# Patient Record
Sex: Male | Born: 1955 | Race: White | Hispanic: No | Marital: Married | Smoking: Former smoker
Health system: Southern US, Community
[De-identification: ages and names within clinical notes are randomized; demographics above are authoritative.]

## PROBLEM LIST (undated history)

## (undated) DIAGNOSIS — E119 Type 2 diabetes mellitus without complications: Secondary | ICD-10-CM

## (undated) DIAGNOSIS — N390 Urinary tract infection, site not specified: Secondary | ICD-10-CM

## (undated) DIAGNOSIS — Z992 Dependence on renal dialysis: Secondary | ICD-10-CM

## (undated) DIAGNOSIS — N189 Chronic kidney disease, unspecified: Secondary | ICD-10-CM

## (undated) DIAGNOSIS — M1990 Unspecified osteoarthritis, unspecified site: Secondary | ICD-10-CM

## (undated) DIAGNOSIS — Z9289 Personal history of other medical treatment: Secondary | ICD-10-CM

## (undated) DIAGNOSIS — I499 Cardiac arrhythmia, unspecified: Secondary | ICD-10-CM

## (undated) DIAGNOSIS — R011 Cardiac murmur, unspecified: Secondary | ICD-10-CM

## (undated) DIAGNOSIS — K219 Gastro-esophageal reflux disease without esophagitis: Secondary | ICD-10-CM

## (undated) DIAGNOSIS — I10 Essential (primary) hypertension: Secondary | ICD-10-CM

## (undated) HISTORY — PX: WISDOM TOOTH EXTRACTION: SHX21

## (undated) HISTORY — PX: EYE SURGERY: SHX253

## (undated) HISTORY — DX: Dependence on renal dialysis: Z99.2

## (undated) HISTORY — PX: OTHER SURGICAL HISTORY: SHX169

---

## 1998-11-05 ENCOUNTER — Emergency Department (HOSPITAL_COMMUNITY): Admission: EM | Admit: 1998-11-05 | Discharge: 1998-11-06 | Payer: Self-pay | Admitting: Emergency Medicine

## 1999-12-31 ENCOUNTER — Emergency Department (HOSPITAL_COMMUNITY): Admission: EM | Admit: 1999-12-31 | Discharge: 1999-12-31 | Payer: Self-pay | Admitting: Emergency Medicine

## 1999-12-31 ENCOUNTER — Encounter: Payer: Self-pay | Admitting: Emergency Medicine

## 2000-04-01 ENCOUNTER — Encounter: Admission: RE | Admit: 2000-04-01 | Discharge: 2000-04-20 | Payer: Self-pay | Admitting: Neurosurgery

## 2002-02-01 ENCOUNTER — Emergency Department (HOSPITAL_COMMUNITY): Admission: EM | Admit: 2002-02-01 | Discharge: 2002-02-01 | Payer: Self-pay | Admitting: Emergency Medicine

## 2006-09-04 ENCOUNTER — Emergency Department (HOSPITAL_COMMUNITY): Admission: EM | Admit: 2006-09-04 | Discharge: 2006-09-04 | Payer: Self-pay | Admitting: Family Medicine

## 2009-05-07 ENCOUNTER — Emergency Department (HOSPITAL_COMMUNITY): Admission: EM | Admit: 2009-05-07 | Discharge: 2009-05-07 | Payer: Self-pay | Admitting: Emergency Medicine

## 2015-08-19 ENCOUNTER — Emergency Department (HOSPITAL_COMMUNITY)
Admission: EM | Admit: 2015-08-19 | Discharge: 2015-08-20 | Disposition: A | Discharge status: Home / Self Care | Payer: BLUE CROSS/BLUE SHIELD | Attending: Emergency Medicine | Admitting: Emergency Medicine

## 2015-08-19 ENCOUNTER — Encounter (HOSPITAL_COMMUNITY): Payer: Self-pay

## 2015-08-19 DIAGNOSIS — T25221A Burn of second degree of right foot, initial encounter: Secondary | ICD-10-CM

## 2015-08-19 DIAGNOSIS — E119 Type 2 diabetes mellitus without complications: Secondary | ICD-10-CM | POA: Insufficient documentation

## 2015-08-19 DIAGNOSIS — F17200 Nicotine dependence, unspecified, uncomplicated: Secondary | ICD-10-CM | POA: Diagnosis not present

## 2015-08-19 DIAGNOSIS — T25021A Burn of unspecified degree of right foot, initial encounter: Secondary | ICD-10-CM | POA: Diagnosis present

## 2015-08-19 DIAGNOSIS — Z23 Encounter for immunization: Secondary | ICD-10-CM | POA: Diagnosis not present

## 2015-08-19 DIAGNOSIS — I10 Essential (primary) hypertension: Secondary | ICD-10-CM | POA: Diagnosis not present

## 2015-08-19 DIAGNOSIS — Y93G3 Activity, cooking and baking: Secondary | ICD-10-CM | POA: Diagnosis not present

## 2015-08-19 DIAGNOSIS — Y998 Other external cause status: Secondary | ICD-10-CM | POA: Insufficient documentation

## 2015-08-19 DIAGNOSIS — X19XXXA Contact with other heat and hot substances, initial encounter: Secondary | ICD-10-CM | POA: Diagnosis not present

## 2015-08-19 DIAGNOSIS — Y9289 Other specified places as the place of occurrence of the external cause: Secondary | ICD-10-CM | POA: Diagnosis not present

## 2015-08-19 HISTORY — DX: Essential (primary) hypertension: I10

## 2015-08-19 HISTORY — DX: Type 2 diabetes mellitus without complications: E11.9
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
## 2015-08-20 LAB — CBC WITH DIFFERENTIAL/PLATELET
Basophils Absolute: 0 10*3/uL (ref 0.0–0.1)
Basophils Relative: 1 %
Eosinophils Absolute: 0.3 10*3/uL (ref 0.0–0.7)
Eosinophils Relative: 3 %
HCT: 44.2 % (ref 39.0–52.0)
Hemoglobin: 15.3 g/dL (ref 13.0–17.0)
Lymphocytes Relative: 26 %
Lymphs Abs: 2.3 10*3/uL (ref 0.7–4.0)
MCH: 34.2 pg — ABNORMAL HIGH (ref 26.0–34.0)
MCHC: 34.6 g/dL (ref 30.0–36.0)
MCV: 98.7 fL (ref 78.0–100.0)
Monocytes Absolute: 0.6 10*3/uL (ref 0.1–1.0)
Monocytes Relative: 7 %
Neutro Abs: 5.5 10*3/uL (ref 1.7–7.7)
Neutrophils Relative %: 63 %
Platelets: 185 10*3/uL (ref 150–400)
RBC: 4.48 MIL/uL (ref 4.22–5.81)
RDW: 12.1 % (ref 11.5–15.5)
WBC: 8.7 10*3/uL (ref 4.0–10.5)

## 2015-08-20 LAB — BASIC METABOLIC PANEL
Anion gap: 9 (ref 5–15)
BUN: 10 mg/dL (ref 6–20)
CO2: 28 mmol/L (ref 22–32)
Calcium: 9.4 mg/dL (ref 8.9–10.3)
Chloride: 99 mmol/L — ABNORMAL LOW (ref 101–111)
Creatinine, Ser: 1.01 mg/dL (ref 0.61–1.24)
GFR calc Af Amer: >60 mL/min (ref >60)
GFR calc non Af Amer: >60 mL/min (ref >60)
Glucose, Bld: 195 mg/dL — ABNORMAL HIGH (ref 65–99)
Potassium: 4.5 mmol/L (ref 3.5–5.1)
Sodium: 136 mmol/L (ref 135–145)

## 2015-08-20 LAB — I-STAT CG4 LACTIC ACID, ED
LACTIC ACID, VENOUS: 2.21 mmol/L — AB (ref 0.5–2.0)
Lactic Acid, Venous: 2.61 mmol/L (ref 0.5–2.0)

## 2015-08-20 MED ORDER — MORPHINE SULFATE (PF) 4 MG/ML IV SOLN
4.0000 mg | Freq: Once | INTRAVENOUS | Status: AC
  Administered 2015-08-20: 4 mg via INTRAVENOUS
  Filled 2015-08-20: qty 1

## 2015-08-20 MED ORDER — SILVER SULFADIAZINE 1 % EX CREA
TOPICAL_CREAM | Freq: Once | CUTANEOUS | Status: AC
  Administered 2015-08-20: 04:00:00 via TOPICAL
  Filled 2015-08-20: qty 85

## 2015-08-20 MED ORDER — METFORMIN HCL 500 MG PO TABS: 500.0000 mg | ORAL_TABLET | Freq: Two times a day (BID) | ORAL | Status: DC

## 2015-08-20 MED ORDER — SODIUM CHLORIDE 0.9 % IV BOLUS (SEPSIS)
1000.0000 mL | Freq: Once | INTRAVENOUS | Status: AC
  Administered 2015-08-20: 1000 mL via INTRAVENOUS

## 2015-08-20 MED ORDER — AMLODIPINE BESYLATE 10 MG PO TABS: 10.0000 mg | ORAL_TABLET | Freq: Every day | ORAL | Status: DC

## 2015-08-20 MED ORDER — ONDANSETRON HCL 4 MG/2ML IJ SOLN
4.0000 mg | Freq: Once | INTRAMUSCULAR | Status: AC
  Administered 2015-08-20: 4 mg via INTRAVENOUS
  Filled 2015-08-20: qty 2

## 2015-08-20 MED ORDER — OXYCODONE-ACETAMINOPHEN 5-325 MG PO TABS
2.0000 | ORAL_TABLET | Freq: Once | ORAL | Status: AC
  Administered 2015-08-20: 2 via ORAL
  Filled 2015-08-20: qty 2

## 2015-08-20 MED ORDER — OXYCODONE-ACETAMINOPHEN 5-325 MG PO TABS: 1.0000 | ORAL_TABLET | ORAL | Status: DC | PRN

## 2015-08-20 MED ORDER — SILVER SULFADIAZINE 1 % EX CREA: 1.0000 "application " | TOPICAL_CREAM | Freq: Every day | CUTANEOUS | Status: DC

## 2015-08-20 MED ORDER — PIPERACILLIN-TAZOBACTAM 3.375 G IVPB 30 MIN
3.3750 g | Freq: Once | INTRAVENOUS | Status: AC
  Administered 2015-08-20: 3.375 g via INTRAVENOUS
  Filled 2015-08-20: qty 50

## 2015-08-20 MED ORDER — TETANUS-DIPHTH-ACELL PERTUSSIS 5-2.5-18.5 LF-MCG/0.5 IM SUSP
0.5000 mL | Freq: Once | INTRAMUSCULAR | Status: AC
  Administered 2015-08-20: 0.5 mL via INTRAMUSCULAR
  Filled 2015-08-20: qty 0.5

## 2015-08-20 NOTE — ED Notes (Signed)
MD aware of Lactic Acid.
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
## 2015-08-20 NOTE — Discharge Instructions (Signed)
Burn Care Your skin is a natural barrier to infection. It is the largest organ of your body. Burns damage this natural protection. To help prevent infection, it is very important to follow your caregiver's instructions in the care of your burn. Burns are classified as:  First degree. There is only redness of the skin (erythema). No scarring is expected.  Second degree. There is blistering of the skin. Scarring may occur with deeper burns.  Third degree. All layers of the skin are injured, and scarring is expected. HOME CARE INSTRUCTIONS   Wash your hands well before changing your bandage.  Change your bandage as often as directed by your caregiver.  Remove the old bandage. If the bandage sticks, you may soak it off with cool, clean water.  Cleanse the burn thoroughly but gently with mild soap and water.  Pat the area dry with a clean, dry cloth.  Apply a thin layer of antibacterial cream to the burn.  Apply a clean bandage as instructed by your caregiver.  Keep the bandage as clean and dry as possible.  Elevate the affected area for the first 24 hours, then as instructed by your caregiver.  Only take over-the-counter or prescription medicines for pain, discomfort, or fever as directed by your caregiver. SEEK IMMEDIATE MEDICAL CARE IF:   You develop excessive pain.  You develop redness, tenderness, swelling, or red streaks near the burn.  The burned area develops yellowish-white fluid (pus) or a bad smell.  You have a fever. MAKE SURE YOU:   Understand these instructions.  Will watch your condition.  Will get help right away if you are not doing well or get worse.   This information is not intended to replace advice given to you by your health care provider. Make sure you discuss any questions you have with your health care provider.   Document Released: 03/24/2005 Document Revised: 06/16/2011 Document Reviewed: 08/14/2010 Elsevier Interactive Patient Education 2016  Elsevier [employer]  Second-Degree Burn A second-degree burn affects the 2 outer layers of skin. The outer layer (epidermis) and the layer underneath it (dermis) are both burned. Another name for this type of burn is a partial thickness burn. A second-degree burn may be called minor or major. This depends on the size of the burn. It also depends on what parts of the skin are burned. Minor burns may be treated with first aid. Major burns are a medical emergency. A second-degree burn is worse than a first-degree burn, but not as bad as a third-degree burn. A first-degree burn affects only the epidermis. A third-degree burn goes through all the layers of skin. A second-degree burn usually heals in 3 to 4 weeks. A minor second-degree burn usually does not leave a scar.Deeper second-degree burns may lead to scarring of the skin or contractures over joints.Contractures are scars that form over joints and may lead to reduced mobility at those joints. CAUSES  Heat (thermal) injury. This happens when skin comes in contact with something very hot. It could be a flame, a hot object, hot liquid, or steam. Most second-degree burns are thermal injuries.  Radiation. Sunlight is one type of radiation that can burn the skin. Another type of radiation is used to heat food. Radiation is also used to treat some diseases, such as cancer. All types of radiation can burn the skin. Sunlight usually causes a first-degree burn. Radiation used for heating food or treating a disease can cause a second-degree burn.  Electricity. Electrical burns can cause more  damage under the skin than on the surface. They should always be treated as major burns.  Chemicals. Many chemicals can burn the skin. The burn should be flushed with cool water and checked by an emergency caregiver. SYMPTOMS Symptoms of second-degree burns include:  Severe pain.  Extreme tenderness.  Deep redness.  Blistered skin.  Skin that has changed color.It  might look blotchy, wet, or shiny.  Swelling. TREATMENT Some second-degree burns may need to be treated in a hospital. These include major burns, electrical burns, and chemical burns. Many other second-degree burns can be treated with regular first aid, such as:  Cooling the burn. Use cool, germ-free (sterile) salt water. Place the burned area of skin into a tub of water, or cover the burned area with clean, wet towels.  Taking pain medicine.  Removing the dead skin from broken blisters. A trained caregiver may do this. Do not pop blisters.  Gently washing your skin with mild soap.  Covering the burned area with a cream.Silver sulfadiazine is a cream for burns. An antibiotic cream, such as bacitracin, may also be used to fight infection. Do not use other ointments or creams unless your caregiver says it is okay.  Protecting the burn with a sterile, non-sticky bandage.  Bandaging fingers and toes separately. This keeps them from sticking together.  Taking an antibiotic. This can help prevent infection.  Getting a tetanus shot. HOME CARE INSTRUCTIONS Medication  Take any medicine prescribed by your caregiver. Follow the directions carefully.  Ask your caregiver if you can take over-the-counter medicine to relieve pain and swelling. Do not give aspirin to children.  Make sure your caregiver knows about all other medicines you take.This includes over-the-counter medicines. Burn care  You will need to change the bandage on your burn. You may need to do this 2 or 3 times each day.  Gently clean the burned area.  Put ointment on it.  Cover the burn with a sterile bandage.  For some deeper burns or burns that cover a large area, compression garments may be prescribed. These garments can help minimize scarring and protect your mobility.  Do not put butter or oil on your skin. Use only the cream prescribed by your caregiver.  Do not put ice on your burn.  Do not break blisters  on your skin.  Keep the bandaged area dry. You might need to take a sponge bath for awhile.Ask your caregiver when you can take a shower or a tub bath again.  Do not scratch an itchy burn. Your caregiver may give you medicine to relieve very bad itching.  Infection is a big danger after a second-degree burn. Tell your caregiver right away if you have signs of infection, such as:  Redness or changing color in the burned area.  Fluid leaking from the burn.  Swelling in the burn area.  A bad smell coming from the wound. Follow-up  Keep all follow-up appointments.This is important. This is how your caregiver can tell if your treatment is working.  Protect your burn from sunlight.Use sunscreen whenever you go outside.Burned areas may be sensitive to the sun for up to 1 year. Exposure to the sun may also cause permanent darkening of scars. SEEK MEDICAL CARE IF:  You have any questions about medicines.  You have any questions about your treatment.  You wonder if it is okay to do a particular activity.  You develop a fever of more than 100.5 F (38.1 C). SEEK IMMEDIATE MEDICAL CARE IF:  You think your burn might be infected. It may change color, become red, leak fluid, swell, or smell bad.  You develop a fever of more than 102 F (38.9 C).   This information is not intended to replace advice given to you by your health care provider. Make sure you discuss any questions you have with your health care provider.   Document Released: 08/26/2010 Document Revised: 06/16/2011 Document Reviewed: 08/26/2010 Elsevier Interactive Patient Education [employer].

## 2015-08-20 NOTE — ED Notes (Signed)
Pt verbalized understanding of discharge instructions and follow up care

## 2015-08-22 ENCOUNTER — Inpatient Hospital Stay (HOSPITAL_COMMUNITY)
Admission: EM | Admit: 2015-08-22 | Discharge: 2015-08-26 | DRG: 603 | Disposition: A | Discharge status: Home / Self Care | Payer: BLUE CROSS/BLUE SHIELD | Attending: Internal Medicine | Admitting: Internal Medicine

## 2015-08-22 ENCOUNTER — Encounter (HOSPITAL_COMMUNITY): Payer: Self-pay | Admitting: Emergency Medicine

## 2015-08-22 DIAGNOSIS — L03115 Cellulitis of right lower limb: Secondary | ICD-10-CM | POA: Diagnosis present

## 2015-08-22 DIAGNOSIS — Z8249 Family history of ischemic heart disease and other diseases of the circulatory system: Secondary | ICD-10-CM | POA: Diagnosis not present

## 2015-08-22 DIAGNOSIS — L97909 Non-pressure chronic ulcer of unspecified part of unspecified lower leg with unspecified severity: Secondary | ICD-10-CM | POA: Diagnosis not present

## 2015-08-22 DIAGNOSIS — Z801 Family history of malignant neoplasm of trachea, bronchus and lung: Secondary | ICD-10-CM | POA: Diagnosis not present

## 2015-08-22 DIAGNOSIS — E11621 Type 2 diabetes mellitus with foot ulcer: Secondary | ICD-10-CM | POA: Diagnosis present

## 2015-08-22 DIAGNOSIS — E1169 Type 2 diabetes mellitus with other specified complication: Secondary | ICD-10-CM | POA: Diagnosis present

## 2015-08-22 DIAGNOSIS — E11622 Type 2 diabetes mellitus with other skin ulcer: Secondary | ICD-10-CM

## 2015-08-22 DIAGNOSIS — L97519 Non-pressure chronic ulcer of other part of right foot with unspecified severity: Secondary | ICD-10-CM | POA: Diagnosis present

## 2015-08-22 DIAGNOSIS — Z833 Family history of diabetes mellitus: Secondary | ICD-10-CM

## 2015-08-22 DIAGNOSIS — E119 Type 2 diabetes mellitus without complications: Secondary | ICD-10-CM | POA: Diagnosis present

## 2015-08-22 DIAGNOSIS — Z7984 Long term (current) use of oral hypoglycemic drugs: Secondary | ICD-10-CM

## 2015-08-22 DIAGNOSIS — T25021A Burn of unspecified degree of right foot, initial encounter: Secondary | ICD-10-CM | POA: Diagnosis present

## 2015-08-22 DIAGNOSIS — S91301A Unspecified open wound, right foot, initial encounter: Secondary | ICD-10-CM

## 2015-08-22 DIAGNOSIS — E1165 Type 2 diabetes mellitus with hyperglycemia: Secondary | ICD-10-CM | POA: Diagnosis present

## 2015-08-22 DIAGNOSIS — Z720 Tobacco use: Secondary | ICD-10-CM

## 2015-08-22 DIAGNOSIS — L0390 Cellulitis, unspecified: Secondary | ICD-10-CM | POA: Diagnosis present

## 2015-08-22 DIAGNOSIS — T798XXA Other early complications of trauma, initial encounter: Secondary | ICD-10-CM | POA: Diagnosis present

## 2015-08-22 DIAGNOSIS — I10 Essential (primary) hypertension: Secondary | ICD-10-CM | POA: Diagnosis present

## 2015-08-22 DIAGNOSIS — Y92009 Unspecified place in unspecified non-institutional (private) residence as the place of occurrence of the external cause: Secondary | ICD-10-CM | POA: Diagnosis not present

## 2015-08-22 DIAGNOSIS — F17200 Nicotine dependence, unspecified, uncomplicated: Secondary | ICD-10-CM | POA: Diagnosis present

## 2015-08-22 DIAGNOSIS — S91309A Unspecified open wound, unspecified foot, initial encounter: Secondary | ICD-10-CM | POA: Diagnosis present

## 2015-08-22 DIAGNOSIS — Z79899 Other long term (current) drug therapy: Secondary | ICD-10-CM | POA: Diagnosis not present

## 2015-08-22 DIAGNOSIS — X101XXA Contact with hot food, initial encounter: Secondary | ICD-10-CM | POA: Diagnosis present

## 2015-08-22 DIAGNOSIS — Z9119 Patient's noncompliance with other medical treatment and regimen: Secondary | ICD-10-CM | POA: Diagnosis not present

## 2015-08-22 DIAGNOSIS — IMO0002 Reserved for concepts with insufficient information to code with codable children: Secondary | ICD-10-CM

## 2015-08-22 DIAGNOSIS — M869 Osteomyelitis, unspecified: Secondary | ICD-10-CM

## 2015-08-22 LAB — CBC WITH DIFFERENTIAL/PLATELET
Basophils Absolute: 0 10*3/uL (ref 0.0–0.1)
Basophils Relative: 0 %
EOS ABS: 0.1 10*3/uL (ref 0.0–0.7)
Eosinophils Relative: 1 %
HCT: 44.2 % (ref 39.0–52.0)
HEMOGLOBIN: 15.4 g/dL (ref 13.0–17.0)
LYMPHS ABS: 1.6 10*3/uL (ref 0.7–4.0)
LYMPHS PCT: 18 %
MCH: 34.3 pg — AB (ref 26.0–34.0)
MCHC: 34.8 g/dL (ref 30.0–36.0)
MCV: 98.4 fL (ref 78.0–100.0)
MONOS PCT: 7 %
Monocytes Absolute: 0.7 10*3/uL (ref 0.1–1.0)
NEUTROS ABS: 6.7 10*3/uL (ref 1.7–7.7)
NEUTROS PCT: 74 %
Platelets: 208 10*3/uL (ref 150–400)
RBC: 4.49 MIL/uL (ref 4.22–5.81)
RDW: 12.1 % (ref 11.5–15.5)
WBC: 9 10*3/uL (ref 4.0–10.5)

## 2015-08-22 LAB — BASIC METABOLIC PANEL
ANION GAP: 11 (ref 5–15)
BUN: 10 mg/dL (ref 6–20)
CHLORIDE: 98 mmol/L — AB (ref 101–111)
CO2: 27 mmol/L (ref 22–32)
CREATININE: 0.86 mg/dL (ref 0.61–1.24)
Calcium: 9.2 mg/dL (ref 8.9–10.3)
GFR calc non Af Amer: >60 mL/min (ref >60)
Glucose, Bld: 156 mg/dL — ABNORMAL HIGH (ref 65–99)
POTASSIUM: 4.8 mmol/L (ref 3.5–5.1)
SODIUM: 136 mmol/L (ref 135–145)

## 2015-08-22 LAB — LACTIC ACID, PLASMA: LACTIC ACID, VENOUS: 2 mmol/L (ref 0.5–2.0)

## 2015-08-22 MED ORDER — ACETAMINOPHEN 325 MG PO TABS: 650.0000 mg | ORAL_TABLET | Freq: Four times a day (QID) | ORAL | Status: DC | PRN

## 2015-08-22 MED ORDER — ACETAMINOPHEN 650 MG RE SUPP
650.0000 mg | Freq: Four times a day (QID) | RECTAL | Status: DC | PRN
Start: 2015-08-22 — End: 2015-08-26

## 2015-08-22 MED ORDER — SILVER SULFADIAZINE 1 % EX CREA
1.0000 "application " | TOPICAL_CREAM | Freq: Every day | CUTANEOUS | Status: DC
  Filled 2015-08-22: qty 85

## 2015-08-22 MED ORDER — INSULIN ASPART 100 UNIT/ML ~~LOC~~ SOLN
0.0000 [IU] | Freq: Every day | SUBCUTANEOUS | Status: DC
  Administered 2015-08-23 – 2015-08-24 (×2): 2 [IU] via SUBCUTANEOUS

## 2015-08-22 MED ORDER — ONDANSETRON HCL 4 MG PO TABS
4.0000 mg | ORAL_TABLET | Freq: Four times a day (QID) | ORAL | Status: DC | PRN
Start: 2015-08-22 — End: 2015-08-26
  Administered 2015-08-24: 4 mg via ORAL
  Filled 2015-08-22: qty 1

## 2015-08-22 MED ORDER — INSULIN ASPART 100 UNIT/ML ~~LOC~~ SOLN
0.0000 [IU] | Freq: Three times a day (TID) | SUBCUTANEOUS | Status: DC
  Administered 2015-08-23: 5 [IU] via SUBCUTANEOUS
  Administered 2015-08-23: 2 [IU] via SUBCUTANEOUS
  Administered 2015-08-23 – 2015-08-25 (×5): 3 [IU] via SUBCUTANEOUS
  Administered 2015-08-25: 2 [IU] via SUBCUTANEOUS
  Administered 2015-08-25: 3 [IU] via SUBCUTANEOUS
  Administered 2015-08-26: 5 [IU] via SUBCUTANEOUS

## 2015-08-22 MED ORDER — SODIUM CHLORIDE 0.9 % IV SOLN
1250.0000 mg | Freq: Two times a day (BID) | INTRAVENOUS | Status: DC
  Administered 2015-08-22 – 2015-08-25 (×6): 1250 mg via INTRAVENOUS
  Filled 2015-08-22 (×8): qty 1250

## 2015-08-22 MED ORDER — GUAIFENESIN ER 600 MG PO TB12
600.0000 mg | ORAL_TABLET | Freq: Two times a day (BID) | ORAL | Status: DC
  Administered 2015-08-23 – 2015-08-26 (×8): 600 mg via ORAL
  Filled 2015-08-22 (×8): qty 1

## 2015-08-22 MED ORDER — AMLODIPINE BESYLATE 10 MG PO TABS
10.0000 mg | ORAL_TABLET | Freq: Every day | ORAL | Status: DC
  Administered 2015-08-23 – 2015-08-26 (×4): 10 mg via ORAL
  Filled 2015-08-22 (×4): qty 1

## 2015-08-22 MED ORDER — SODIUM CHLORIDE 0.9 % IV SOLN
INTRAVENOUS | Status: DC
  Administered 2015-08-23: via INTRAVENOUS

## 2015-08-22 MED ORDER — ALBUTEROL SULFATE (2.5 MG/3ML) 0.083% IN NEBU: 2.5000 mg | INHALATION_SOLUTION | RESPIRATORY_TRACT | Status: DC | PRN

## 2015-08-22 MED ORDER — OXYCODONE-ACETAMINOPHEN 5-325 MG PO TABS
1.0000 | ORAL_TABLET | ORAL | Status: DC | PRN
  Administered 2015-08-23: 1 via ORAL
  Filled 2015-08-22: qty 1

## 2015-08-22 MED ORDER — ONDANSETRON HCL 4 MG/2ML IJ SOLN: 4.0000 mg | Freq: Four times a day (QID) | INTRAMUSCULAR | Status: DC | PRN

## 2015-08-22 MED ORDER — ENOXAPARIN SODIUM 40 MG/0.4ML ~~LOC~~ SOLN
40.0000 mg | SUBCUTANEOUS | Status: DC
  Administered 2015-08-23 – 2015-08-26 (×4): 40 mg via SUBCUTANEOUS
  Filled 2015-08-22 (×5): qty 0.4
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
## 2015-08-23 LAB — COMPREHENSIVE METABOLIC PANEL
ALT: 17 U/L (ref 17–63)
ANION GAP: 10 (ref 5–15)
AST: 23 U/L (ref 15–41)
Albumin: 3.4 g/dL — ABNORMAL LOW (ref 3.5–5.0)
Alkaline Phosphatase: 84 U/L (ref 38–126)
BUN: 10 mg/dL (ref 6–20)
CALCIUM: 8.8 mg/dL — AB (ref 8.9–10.3)
CHLORIDE: 96 mmol/L — AB (ref 101–111)
CO2: 26 mmol/L (ref 22–32)
Creatinine, Ser: 0.89 mg/dL (ref 0.61–1.24)
GFR calc non Af Amer: >60 mL/min (ref >60)
Glucose, Bld: 203 mg/dL — ABNORMAL HIGH (ref 65–99)
POTASSIUM: 4.2 mmol/L (ref 3.5–5.1)
SODIUM: 132 mmol/L — AB (ref 135–145)
Total Bilirubin: 0.8 mg/dL (ref 0.3–1.2)
Total Protein: 7.2 g/dL (ref 6.5–8.1)

## 2015-08-23 LAB — PROTIME-INR
INR: 1.07 (ref 0.00–1.49)
PROTHROMBIN TIME: 14.1 s (ref 11.6–15.2)

## 2015-08-23 LAB — PHOSPHORUS: PHOSPHORUS: 2.2 mg/dL — AB (ref 2.5–4.6)

## 2015-08-23 LAB — CBC
HCT: 39.8 % (ref 39.0–52.0)
HEMOGLOBIN: 13.6 g/dL (ref 13.0–17.0)
MCH: 33.4 pg (ref 26.0–34.0)
MCHC: 34.2 g/dL (ref 30.0–36.0)
MCV: 97.8 fL (ref 78.0–100.0)
PLATELETS: 185 10*3/uL (ref 150–400)
RBC: 4.07 MIL/uL — AB (ref 4.22–5.81)
RDW: 12.2 % (ref 11.5–15.5)
WBC: 7 10*3/uL (ref 4.0–10.5)

## 2015-08-23 LAB — GLUCOSE, CAPILLARY
GLUCOSE-CAPILLARY: 203 mg/dL — AB (ref 65–99)
Glucose-Capillary: 136 mg/dL — ABNORMAL HIGH (ref 65–99)
Glucose-Capillary: 221 mg/dL — ABNORMAL HIGH (ref 65–99)
Glucose-Capillary: 189 mg/dL — ABNORMAL HIGH (ref 65–99)
Glucose-Capillary: 180 mg/dL — ABNORMAL HIGH (ref 65–99)

## 2015-08-23 LAB — MAGNESIUM: MAGNESIUM: 1.6 mg/dL — AB (ref 1.7–2.4)

## 2015-08-23 LAB — TSH: TSH: 1.697 u[IU]/mL (ref 0.350–4.500)

## 2015-08-23 LAB — HIV ANTIBODY (ROUTINE TESTING W REFLEX): HIV SCREEN 4TH GENERATION: NONREACTIVE

## 2015-08-23 MED ORDER — SILVER SULFADIAZINE 1 % EX CREA
1.0000 "application " | TOPICAL_CREAM | Freq: Every day | CUTANEOUS | Status: DC
  Administered 2015-08-24 – 2015-08-26 (×3): 1 via TOPICAL
  Filled 2015-08-23: qty 85

## 2015-08-23 MED ORDER — OXYCODONE-ACETAMINOPHEN 5-325 MG PO TABS
1.0000 | ORAL_TABLET | ORAL | Status: DC | PRN
  Administered 2015-08-23 – 2015-08-26 (×10): 2 via ORAL
  Filled 2015-08-23 (×10): qty 2

## 2015-08-23 MED ORDER — K PHOS MONO-SOD PHOS DI & MONO 155-852-130 MG PO TABS
250.0000 mg | ORAL_TABLET | Freq: Two times a day (BID) | ORAL | Status: AC
  Administered 2015-08-23 – 2015-08-24 (×2): 250 mg via ORAL
  Filled 2015-08-23 (×2): qty 1

## 2015-08-23 MED ORDER — VANCOMYCIN HCL IN DEXTROSE 1-5 GM/200ML-% IV SOLN: 1000.0000 mg | Freq: Once | INTRAVENOUS | Status: DC

## 2015-08-23 MED ORDER — MAGNESIUM SULFATE 2 GM/50ML IV SOLN
2.0000 g | Freq: Once | INTRAVENOUS | Status: AC
  Administered 2015-08-23: 2 g via INTRAVENOUS
  Filled 2015-08-23: qty 50

## 2015-08-23 MED ORDER — HYDROMORPHONE HCL 1 MG/ML IJ SOLN: 1.0000 mg | INTRAMUSCULAR | Status: DC | PRN
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
## 2015-08-24 LAB — PHOSPHORUS: Phosphorus: 2.5 mg/dL (ref 2.5–4.6)

## 2015-08-24 LAB — BASIC METABOLIC PANEL
ANION GAP: 8 (ref 5–15)
BUN: 9 mg/dL (ref 6–20)
CHLORIDE: 97 mmol/L — AB (ref 101–111)
CO2: 30 mmol/L (ref 22–32)
CREATININE: 0.87 mg/dL (ref 0.61–1.24)
Calcium: 8.7 mg/dL — ABNORMAL LOW (ref 8.9–10.3)
GFR calc non Af Amer: >60 mL/min (ref >60)
Glucose, Bld: 161 mg/dL — ABNORMAL HIGH (ref 65–99)
POTASSIUM: 3.9 mmol/L (ref 3.5–5.1)
SODIUM: 135 mmol/L (ref 135–145)

## 2015-08-24 LAB — CBC
HCT: 38.7 % — ABNORMAL LOW (ref 39.0–52.0)
Hemoglobin: 13.1 g/dL (ref 13.0–17.0)
MCH: 32.8 pg (ref 26.0–34.0)
MCHC: 33.9 g/dL (ref 30.0–36.0)
MCV: 97 fL (ref 78.0–100.0)
Platelets: 180 10*3/uL (ref 150–400)
RBC: 3.99 MIL/uL — ABNORMAL LOW (ref 4.22–5.81)
RDW: 12 % (ref 11.5–15.5)
WBC: 5.2 10*3/uL (ref 4.0–10.5)

## 2015-08-24 LAB — GLUCOSE, CAPILLARY
GLUCOSE-CAPILLARY: 156 mg/dL — AB (ref 65–99)
GLUCOSE-CAPILLARY: 161 mg/dL — AB (ref 65–99)
GLUCOSE-CAPILLARY: 174 mg/dL — AB (ref 65–99)
GLUCOSE-CAPILLARY: 203 mg/dL — AB (ref 65–99)

## 2015-08-24 LAB — HEPATITIS PANEL, ACUTE
HCV Ab: <0.1 s/co ratio (ref 0.0–0.9)
Hep A IgM: NEGATIVE
Hep B C IgM: NEGATIVE
Hepatitis B Surface Ag: NEGATIVE

## 2015-08-24 LAB — MAGNESIUM: Magnesium: 2 mg/dL (ref 1.7–2.4)

## 2015-08-24 LAB — HEMOGLOBIN A1C
Hgb A1c MFr Bld: 8.2 % — ABNORMAL HIGH (ref 4.8–5.6)
Mean Plasma Glucose: 189 mg/dL

## 2015-08-24 MED ORDER — NICOTINE 21 MG/24HR TD PT24
21.0000 mg | MEDICATED_PATCH | Freq: Every day | TRANSDERMAL | Status: DC
  Administered 2015-08-24 – 2015-08-26 (×3): 21 mg via TRANSDERMAL
  Filled 2015-08-24 (×3): qty 1

## 2015-08-24 MED ORDER — INSULIN DETEMIR 100 UNIT/ML ~~LOC~~ SOLN
10.0000 [IU] | Freq: Every day | SUBCUTANEOUS | Status: DC
  Administered 2015-08-24 – 2015-08-25 (×2): 10 [IU] via SUBCUTANEOUS
  Filled 2015-08-24 (×3): qty 0.1

## 2015-08-24 MED ORDER — PIPERACILLIN-TAZOBACTAM 3.375 G IVPB
3.3750 g | Freq: Three times a day (TID) | INTRAVENOUS | Status: DC
  Administered 2015-08-24 – 2015-08-25 (×3): 3.375 g via INTRAVENOUS
  Filled 2015-08-24 (×4): qty 50

## 2015-08-24 NOTE — Progress Notes (Signed)
Inpatient Diabetes Program Recommendations  AACE/ADA: New Consensus Statement on Inpatient Glycemic Control (2015)  Target Ranges:  Prepandial:   less than 140 mg/dL      Peak postprandial:   less than 180 mg/dL (1-2 hours)      Critically ill patients:  140 - 180 mg/dL   Review of Glycemic Control: Results for OSIE, ABONCE (MRN [id number]) as of 08/24/2015 12:55  Ref. Range 08/23/2015 12:53 08/23/2015 16:43 08/23/2015 21:02 08/24/2015 07:34 08/24/2015 11:41  Glucose-Capillary Latest Ref Range: 65-99 mg/dL 221 (H) 189 (H) 180 (H) 156 (H) 161 (H)  Results for TRINTON, MACFADYEN (MRN [id number]) as of 08/24/2015 12:55  Ref. Range 08/23/2015 00:20  Hemoglobin A1C Latest Ref Range: 4.8-5.6 % 8.2 (H)   Diabetes history: Type 2 diabetes Outpatient Diabetes medications: Metformin 500 mg bid Current orders for Inpatient glycemic control:  Novolog moderate tid with meals and HS Inpatient Diabetes Program Recommendations:    May consider adding basal insulin such as Levemir 10 units daily while patient is in the hospital.  Thanks, Adah Perl, RN, BC-ADM Inpatient Diabetes Coordinator Pager [phone] (8a-5p)
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
## 2015-08-25 ENCOUNTER — Inpatient Hospital Stay (HOSPITAL_COMMUNITY): Payer: BLUE CROSS/BLUE SHIELD

## 2015-08-25 LAB — C-REACTIVE PROTEIN: CRP: 0.7 mg/dL (ref <1.0)

## 2015-08-25 LAB — CK: Total CK: 161 U/L (ref 49–397)

## 2015-08-25 LAB — BASIC METABOLIC PANEL
ANION GAP: 9 (ref 5–15)
BUN: 11 mg/dL (ref 6–20)
CO2: 31 mmol/L (ref 22–32)
Calcium: 9.2 mg/dL (ref 8.9–10.3)
Chloride: 94 mmol/L — ABNORMAL LOW (ref 101–111)
Creatinine, Ser: 0.9 mg/dL (ref 0.61–1.24)
GFR calc Af Amer: >60 mL/min (ref >60)
GLUCOSE: 182 mg/dL — AB (ref 65–99)
POTASSIUM: 3.8 mmol/L (ref 3.5–5.1)
Sodium: 134 mmol/L — ABNORMAL LOW (ref 135–145)

## 2015-08-25 LAB — GLUCOSE, CAPILLARY
GLUCOSE-CAPILLARY: 142 mg/dL — AB (ref 65–99)
Glucose-Capillary: 151 mg/dL — ABNORMAL HIGH (ref 65–99)
Glucose-Capillary: 150 mg/dL — ABNORMAL HIGH (ref 65–99)
Glucose-Capillary: 178 mg/dL — ABNORMAL HIGH (ref 65–99)

## 2015-08-25 LAB — CBC
HEMATOCRIT: 39.3 % (ref 39.0–52.0)
HEMOGLOBIN: 13.5 g/dL (ref 13.0–17.0)
MCH: 33.5 pg (ref 26.0–34.0)
MCHC: 34.4 g/dL (ref 30.0–36.0)
MCV: 97.5 fL (ref 78.0–100.0)
PLATELETS: 196 10*3/uL (ref 150–400)
RBC: 4.03 MIL/uL — AB (ref 4.22–5.81)
RDW: 11.9 % (ref 11.5–15.5)
WBC: 5.9 10*3/uL (ref 4.0–10.5)

## 2015-08-25 LAB — SEDIMENTATION RATE: SED RATE: 10 mm/h (ref 0–16)

## 2015-08-25 MED ORDER — CLINDAMYCIN HCL 300 MG PO CAPS
600.0000 mg | ORAL_CAPSULE | Freq: Three times a day (TID) | ORAL | Status: DC
  Administered 2015-08-25 – 2015-08-26 (×4): 600 mg via ORAL
  Filled 2015-08-25 (×4): qty 2

## 2015-08-25 MED ORDER — SACCHAROMYCES BOULARDII 250 MG PO CAPS
250.0000 mg | ORAL_CAPSULE | Freq: Two times a day (BID) | ORAL | Status: DC
  Administered 2015-08-25 – 2015-08-26 (×3): 250 mg via ORAL
  Filled 2015-08-25 (×3): qty 1

## 2015-08-25 MED ORDER — DIPHENHYDRAMINE HCL 25 MG PO CAPS
25.0000 mg | ORAL_CAPSULE | Freq: Four times a day (QID) | ORAL | Status: DC | PRN
  Administered 2015-08-25 – 2015-08-26 (×2): 25 mg via ORAL
  Filled 2015-08-25: qty 1

## 2015-08-25 NOTE — Progress Notes (Signed)
[facility] Progress Note  Patient: Louis Butler MRN:[id number]   PCP: No primary care provider on file. DOB: 09/25/1955   DOA: 08/22/2015   DOS: 08/25/2015   Date of Service: the patient was seen and examined on 08/25/2015  Subjective: Patient is a severe pain when he is trying to place his foot on the ground. No other acute complaints. Occasional itching all over no rash Nutrition: Tolerating oral diet  Brief hospital course: Patient was admitted on 08/22/2015, with complaint of right foot ulcer, was found to have cellulitis of the right foot. Currently further plan is to transition to oral antibiotics and monitor.  Assessment and Plan: 1. Right foot cellulitis with ulcer. Not improving with oral Keflex at home. Was started on vancomycin and Zosyn. Blood cultures remain negative. X-ray no evidence of osteomyelitis. ESR CRP normal. CPK normal. We'll transition to oral Levaquin today and monitor. Also consult physical therapy.  2. Type 2 diabetes mellitus. Continue sliding scale insulin.  3. Essential hypertension. Next and continue Norvasc.   Activity: Pending consult physical therapy Bowel regimen: last BM prior to arrival Diet: Carb modified diet DVT Prophylaxis: subcutaneous Heparin  Advance goals of care discussion: Full code  Family Communication: family was present at bedside, at the time of interview. The pt provided permission to discuss medical plan with the family. Opportunity was given to ask question and all questions were answered satisfactorily.   Disposition:  Discharge to home, likely home health Expected discharge date: 08/26/2015 pending PT consult  Consultants: none Procedures: none  Antibiotics: Anti-infectives    Start     Dose/Rate Route Frequency Ordered Stop   08/25/15 1400  clindamycin (CLEOCIN) capsule 600 mg     600 mg Oral Every 8 hours 08/25/15 1308     08/24/15 1600  piperacillin-tazobactam (ZOSYN) IVPB 3.375 g  Status:   Discontinued     3.375 g 12.5 mL/hr over 240 Minutes Intravenous Every 8 hours 08/24/15 1501 08/25/15 1308   08/23/15 1000  vancomycin (VANCOCIN) IVPB 1000 mg/200 mL premix  Status:  Discontinued     1,000 mg 200 mL/hr over 60 Minutes Intravenous  Once 08/23/15 0953 08/23/15 1008   08/22/15 2030  vancomycin (VANCOCIN) 1,250 mg in sodium chloride 0.9 % 250 mL IVPB  Status:  Discontinued     1,250 mg 166.7 mL/hr over 90 Minutes Intravenous Every 12 hours 08/22/15 2004 08/25/15 1308        Intake/Output Summary (Last 24 hours) at 08/25/15 1839 Last data filed at 08/25/15 1413  Gross per 24 hour  Intake    720 ml  Output      0 ml  Net    720 ml   Filed Weights   08/22/15 2320  Weight: 94.62 kg (208 lb 9.6 oz)    Objective: Physical Exam: Filed Vitals:   08/24/15 1359 08/24/15 2129 08/25/15 0520 08/25/15 1412  BP: 116/80 125/79 156/85 132/69  Pulse: 61 68 71 63  Temp: 98.1 F (36.7 C) 97.6 F (36.4 C) 98.7 F (37.1 C) 98 F (36.7 C)  TempSrc: Oral Oral Oral Oral  Resp: 18 17 18 18  Height:      Weight:      SpO2: 97% 99% 98% 99%    General: Alert, Awake and Oriented to Time, Place and Person. Appear in mild distress Eyes: PERRL, Conjunctiva normal ENT: Oral Mucosa clear moist. Neck: no JVD, no Abnormal Mass Or lumps Cardiovascular: S1 and S2 Present, no Murmur, Peripheral Pulses Present Respiratory:   Bilateral Air entry equal and Decreased, Clear to Auscultation, no Crackles, no wheezes Abdomen: Bowel Sound present, Soft and no tenderness Skin: no redness, no Rash  Extremities: no Pedal edema, no calf tenderness, right foot dressing Neurologic: Grossly no focal neuro deficit. Bilaterally Equal motor strength  Data Reviewed: CBC:  Recent Labs Lab 08/20/15 0026 08/22/15 2022 08/23/15 0020 08/24/15 0533 08/25/15 0432  WBC 8.7 9.0 7.0 5.2 5.9  NEUTROABS 5.5 6.7  --   --   --   HGB 15.3 15.4 13.6 13.1 13.5  HCT 44.2 44.2 39.8 38.7* 39.3  MCV 98.7 98.4 97.8  97.0 97.5  PLT 185 208 185 180 196   Basic Metabolic Panel:  Recent Labs Lab 08/20/15 0026 08/22/15 2022 08/23/15 0020 08/24/15 0533 08/25/15 0432  NA 136 136 132* 135 134*  K 4.5 4.8 4.2 3.9 3.8  CL 99* 98* 96* 97* 94*  CO2 28 27 26 30 31  GLUCOSE 195* 156* 203* 161* 182*  BUN 10 10 10 9 11  CREATININE 1.01 0.86 0.89 0.87 0.90  CALCIUM 9.4 9.2 8.8* 8.7* 9.2  MG  --   --  1.6* 2.0  --   PHOS  --   --  2.2* 2.5  --     Liver Function Tests:  Recent Labs Lab 08/23/15 0020  AST 23  ALT 17  ALKPHOS 84  BILITOT 0.8  PROT 7.2  ALBUMIN 3.4*   No results for input(s): LIPASE, AMYLASE in the last 168 hours. No results for input(s): AMMONIA in the last 168 hours. Coagulation Profile:  Recent Labs Lab 08/23/15 0020  INR 1.07   Cardiac Enzymes:  Recent Labs Lab 08/25/15 0934  CKTOTAL 161   BNP (last 3 results) No results for input(s): PROBNP in the last 8760 hours.  CBG:  Recent Labs Lab 08/24/15 1711 08/24/15 2125 08/25/15 0822 08/25/15 1221 08/25/15 1707  GLUCAP 174* 203* 151* 150* 178*    Studies: Dg Foot Complete Right  08/25/2015  CLINICAL DATA:  Right lower extremity cellulitis.  Osteomyelitis. EXAM: RIGHT FOOT COMPLETE - 3+ VIEW COMPARISON:  None. FINDINGS: Soft tissue swelling in the distal right first toe. No fracture, dislocation, suspicious focal osseous lesion, cortical erosions, periosteal reaction, joint erosions, soft tissue gas or radiopaque foreign body. IMPRESSION: Distal right first toe soft tissue swelling. No radiographic evidence of osteomyelitis. Electronically Signed   By: Jason A Poff M.D.   On: 08/25/2015 12:27     Scheduled Meds: . amLODipine  10 mg Oral Daily  . clindamycin  600 mg Oral Q8H  . enoxaparin (LOVENOX) injection  40 mg Subcutaneous Q24H  . guaiFENesin  600 mg Oral BID  . insulin aspart  0-15 Units Subcutaneous TID WC  . insulin aspart  0-5 Units Subcutaneous QHS  . insulin detemir  10 Units Subcutaneous QHS  .  nicotine  21 mg Transdermal Daily  . saccharomyces boulardii  250 mg Oral BID  . silver sulfADIAZINE  1 application Topical Daily   Continuous Infusions:  PRN Meds: acetaminophen **OR** acetaminophen, albuterol, diphenhydrAMINE, HYDROmorphone (DILAUDID) injection, ondansetron **OR** ondansetron (ZOFRAN) IV, oxyCODONE-acetaminophen  Time spent: 30 minutes  Author: Pranav Patel, MD [facility] Pager: [phone] 08/25/2015 6:39 PM  If 7PM-7AM, please contact night-coverage at [email or web address], password [email or web address]  

## 2015-08-25 NOTE — Progress Notes (Signed)
NTs from dept 6N carried pt wife to ED as she stated her doctor had told her to go to ED for possible blood clot earlier.

## 2015-08-26 ENCOUNTER — Encounter: Payer: Self-pay | Admitting: Internal Medicine

## 2015-08-26 ENCOUNTER — Encounter (HOSPITAL_COMMUNITY): Payer: Self-pay | Admitting: *Deleted

## 2015-08-26 ENCOUNTER — Encounter (HOSPITAL_COMMUNITY): Payer: Self-pay

## 2015-08-26 LAB — CBC
HCT: 39.8 % (ref 39.0–52.0)
Hemoglobin: 13.4 g/dL (ref 13.0–17.0)
MCH: 32.8 pg (ref 26.0–34.0)
MCHC: 33.7 g/dL (ref 30.0–36.0)
MCV: 97.3 fL (ref 78.0–100.0)
Platelets: 197 10*3/uL (ref 150–400)
RBC: 4.09 MIL/uL — ABNORMAL LOW (ref 4.22–5.81)
RDW: 12 % (ref 11.5–15.5)
WBC: 5.9 10*3/uL (ref 4.0–10.5)

## 2015-08-26 LAB — BASIC METABOLIC PANEL
Anion gap: 8 (ref 5–15)
BUN: 14 mg/dL (ref 6–20)
CO2: 29 mmol/L (ref 22–32)
Calcium: 9 mg/dL (ref 8.9–10.3)
Chloride: 96 mmol/L — ABNORMAL LOW (ref 101–111)
Creatinine, Ser: 1.02 mg/dL (ref 0.61–1.24)
GFR calc Af Amer: >60 mL/min (ref >60)
GFR calc non Af Amer: >60 mL/min (ref >60)
Glucose, Bld: 210 mg/dL — ABNORMAL HIGH (ref 65–99)
Potassium: 3.7 mmol/L (ref 3.5–5.1)
Sodium: 133 mmol/L — ABNORMAL LOW (ref 135–145)

## 2015-08-26 LAB — GLUCOSE, CAPILLARY
GLUCOSE-CAPILLARY: 179 mg/dL — AB (ref 65–99)
Glucose-Capillary: 206 mg/dL — ABNORMAL HIGH (ref 65–99)

## 2015-08-26 MED ORDER — NAPROXEN 500 MG PO TABS: 500.0000 mg | ORAL_TABLET | Freq: Two times a day (BID) | ORAL | Status: DC

## 2015-08-26 MED ORDER — FAMOTIDINE 20 MG PO TABS: 20.0000 mg | ORAL_TABLET | Freq: Every day | ORAL | Status: DC

## 2015-08-26 MED ORDER — DIPHENHYDRAMINE HCL 25 MG PO CAPS: 25.0000 mg | ORAL_CAPSULE | Freq: Four times a day (QID) | ORAL | Status: DC | PRN

## 2015-08-26 MED ORDER — NAPROXEN 250 MG PO TABS
500.0000 mg | ORAL_TABLET | Freq: Two times a day (BID) | ORAL | Status: DC
  Administered 2015-08-26: 500 mg via ORAL
  Filled 2015-08-26: qty 2

## 2015-08-26 MED ORDER — CLINDAMYCIN HCL 300 MG PO CAPS: 600.0000 mg | ORAL_CAPSULE | Freq: Three times a day (TID) | ORAL | Status: AC

## 2015-08-26 MED ORDER — OXYCODONE-ACETAMINOPHEN 5-325 MG PO TABS: 1.0000 | ORAL_TABLET | Freq: Three times a day (TID) | ORAL | Status: DC | PRN

## 2015-08-26 MED ORDER — NICOTINE 21 MG/24HR TD PT24: 21.0000 mg | MEDICATED_PATCH | Freq: Every day | TRANSDERMAL | Status: DC

## 2015-08-26 MED ORDER — FAMOTIDINE 20 MG PO TABS
20.0000 mg | ORAL_TABLET | Freq: Every day | ORAL | Status: DC
  Administered 2015-08-26: 20 mg via ORAL
  Filled 2015-08-26: qty 1

## 2015-08-26 MED ORDER — SACCHAROMYCES BOULARDII 250 MG PO CAPS: 250.0000 mg | ORAL_CAPSULE | Freq: Two times a day (BID) | ORAL | Status: DC

## 2015-08-26 NOTE — Progress Notes (Signed)
Pt ready for discharge to home accomp by wife.  DC instructions and wound care reviewed and pt was given supplies for home.  Dressing changed prior to DC.  Rx for percocet given and explained.  Other Rxs will be sent to [facility] and pt understands this.  Pt is to follow up with PCP 1 week which wife states will be family practice, she is to call for appt.  Work note given to pt as per Dr. Posey Pronto.  PT made recommendations for crutches which pt has at home and can use if needed.

## 2015-08-26 NOTE — Discharge Summary (Signed)
[facility] Discharge Summary   Patient: Louis Butler [id number]:[id number]   PCP: No primary care provider on file. DOB: [birth year]   Date of admission: 08/22/2015   Date of discharge: 08/26/2015     Discharge Diagnoses:  Active Problems:   Wound, open, foot   Cellulitis   DM type 2, uncontrolled, with lower extremity ulcer ([place])   Hypertension  Recommendations for Outpatient Follow-up:  1. Please follow up with PCP for wound check as well as Diabetes management    Diet recommendation: diabetic diet  Activity: The patient is advised to use crutches until able to bear weight.  Discharge Condition: good  History of present illness: As per the H and P dictated on admission, "[name] is a 60 y.o. male with medical history significant of poorly controlled diabetes, HTN   Presented with a one-week history foot pain, patient spilled some hot food on his foot 7 days ago and presented to ER on 14 of May Because of redness he was given a dose of Zosyn and discharged to home unfortunately was not given prescription for Keflex he continued to have worsening pain and swelling with redness streaking up the foot. The family have been using Silvadene cream and soaking it in epsom salt and cold water. presented back. No fever, no nausea, no vomiting, no diarrhea, Regarding pertinent Chronic problems: Controlled diabetes secondary to noncompliance   IN ER: Lactic acid 2.2 WBC 9.0 hemoglobin 50.4 Afebrile heart rate up to 102 respirations 18 blood pressure 166/90 "  Hospital Course:  Summary of his active problems in the hospital is as following.  1. Right foot cellulitis with ulcer. Not improving with oral Keflex at home. Was started on vancomycin and Zosyn. Blood cultures remain negative. X-ray no evidence of osteomyelitis. ESR CRP normal. CPK normal. Discharge on oral clindamycin with probiotics.  2. Type 2 diabetes mellitus. Continue metformin Follow up with PCP for further  treatment.  3. Essential hypertension.  continue Norvasc   All other chronic medical condition were stable during the hospitalization.  Patient was seen by physical therapy, who recommended no further therapy needs. On the day of the discharge the patient's vitals were stable, and no other acute medical condition were reported by patient. the patient was felt safe to be discharge at home with family.  Procedures and Results:  none   Consultations:  none  DISCHARGE MEDICATION: Discharge Medication List as of 08/26/2015 11:46 AM    START taking these medications   Details  clindamycin (CLEOCIN) 300 MG capsule Take 2 capsules (600 mg total) by mouth every 8 (eight) hours., Starting 08/26/2015, Until Wed 08/29/15, Normal    diphenhydrAMINE (BENADRYL) 25 mg capsule Take 1 capsule (25 mg total) by mouth every 6 (six) hours as needed for itching., Starting 08/26/2015, Until Discontinued, Normal    famotidine (PEPCID) 20 MG tablet Take 1 tablet (20 mg total) by mouth daily., Starting 08/26/2015, Until Discontinued, Normal    naproxen (NAPROSYN) 500 MG tablet Take 1 tablet (500 mg total) by mouth 2 (two) times daily with a meal., Starting 08/26/2015, Until Discontinued, Normal    nicotine (NICODERM CQ - DOSED IN MG/24 HOURS) 21 mg/24hr patch Place 1 patch (21 mg total) onto the skin daily., Starting 08/26/2015, Until Discontinued, Normal    saccharomyces boulardii (FLORASTOR) 250 MG capsule Take 1 capsule (250 mg total) by mouth 2 (two) times daily., Starting 08/26/2015, Until Discontinued, Normal      CONTINUE these medications which have CHANGED  Details  oxyCODONE-acetaminophen (PERCOCET) 5-325 MG tablet Take 1 tablet by mouth every 8 (eight) hours as needed for severe pain., Starting 08/26/2015, Until Discontinued, Print      CONTINUE these medications which have NOT CHANGED   Details  amLODipine (NORVASC) 10 MG tablet Take 1 tablet (10 mg total) by mouth daily., Starting 08/20/2015,  Until Discontinued, Print    metFORMIN (GLUCOPHAGE) 500 MG tablet Take 1 tablet (500 mg total) by mouth 2 (two) times daily with a meal., Starting 08/20/2015, Until Discontinued, Print    silver sulfADIAZINE (SILVADENE) 1 % cream Apply 1 application topically daily., Starting 08/20/2015, Until Discontinued, Print       No Known Allergies Discharge Instructions    Diet Carb Modified    Complete by:  As directed      Discharge instructions    Complete by:  As directed   It is important that you read following instructions as well as go over your medication list with RN to help you understand your care after this hospitalization.  Discharge Instructions: Please follow-up with PCP in one week  Please request your primary care physician to go over all Hospital Tests and Procedure/Radiological results at the follow up,  Please get all Hospital records sent to your PCP by signing hospital release before you go home.   Do not take more than prescribed Pain, Sleep and Anxiety Medications. You were cared for by a hospitalist during your hospital stay. If you have any questions about your discharge medications or the care you received while you were in the hospital after you are discharged, you can call the unit and ask to speak with the hospitalist on call if the hospitalist that took care of you is not available.  Once you are discharged, your primary care physician will handle any further medical issues. Please note that NO REFILLS for any discharge medications will be authorized once you are discharged, as it is imperative that you return to your primary care physician (or establish a relationship with a primary care physician if you do not have one) for your aftercare needs so that they can reassess your need for medications and monitor your lab values. You Must read complete instructions/literature along with all the possible adverse reactions/side effects for all the Medicines you take and that  have been prescribed to you. Take any new Medicines after you have completely understood and accept all the possible adverse reactions/side effects. Wear Seat belts while driving. If you have smoked or chewed Tobacco in the last 2 yrs please stop smoking and/or stop any Recreational drug use.     Discharge wound care:    Complete by:  As directed   Dry dressing change daily     Increase activity slowly    Complete by:  As directed           Discharge Exam: Filed Weights   08/22/15 2320  Weight: 94.62 kg (208 lb 9.6 oz)   Filed Vitals:   08/25/15 2132 08/26/15 0445  BP: 129/73 173/88  Pulse: 65 71  Temp: 97.7 F (36.5 C) 98.1 F (36.7 C)  Resp: 18 18   General: Appear in no distress, no Rash; Oral Mucosa moist. Cardiovascular: S1 and S2 Present, no Murmur, no JVD Respiratory: Bilateral Air entry present and Clear to Auscultation, no Crackles, no wheezes Abdomen: Bowel Sound present, Soft and no tenderness Extremities: no Pedal edema, no calf tenderness Neurology: Grossly no focal neuro deficit.  The results of significant diagnostics  from this hospitalization (including imaging, microbiology, ancillary and laboratory) are listed below for reference.    Significant Diagnostic Studies: Dg Foot Complete Right  09-24-15  CLINICAL DATA:  Right lower extremity cellulitis.  Osteomyelitis. EXAM: RIGHT FOOT COMPLETE - 3+ VIEW COMPARISON:  None. FINDINGS: Soft tissue swelling in the distal right first toe. No fracture, dislocation, suspicious focal osseous lesion, cortical erosions, periosteal reaction, joint erosions, soft tissue gas or radiopaque foreign body. IMPRESSION: Distal right first toe soft tissue swelling. No radiographic evidence of osteomyelitis. Electronically Signed   By: Ilona Sorrel M.D.   On: 09/24/2015 12:27    Microbiology: Recent Results (from the past 240 hour(s))  Culture, blood (routine x 2)     Status: None (Preliminary result)   Collection Time: 08/23/15  12:20 AM  Result Value Ref Range Status   Specimen Description BLOOD RIGHT ANTECUBITAL  Final   Special Requests BOTTLES DRAWN AEROBIC AND ANAEROBIC 5CC  Final   Culture NO GROWTH 3 DAYS  Final   Report Status PENDING  Incomplete  Culture, blood (routine x 2)     Status: None (Preliminary result)   Collection Time: 08/23/15 12:30 AM  Result Value Ref Range Status   Specimen Description BLOOD RIGHT HAND  Final   Special Requests BOTTLES DRAWN AEROBIC AND ANAEROBIC 5CC  Final   Culture NO GROWTH 3 DAYS  Final   Report Status PENDING  Incomplete     Labs: CBC:  Recent Labs Lab 08/20/15 0026 08/22/15 2022 08/23/15 0020 08/24/15 0533 09-24-2015 0432 08/26/15 0540  WBC 8.7 9.0 7.0 5.2 5.9 5.9  NEUTROABS 5.5 6.7  --   --   --   --   HGB 15.3 15.4 13.6 13.1 13.5 13.4  HCT 44.2 44.2 39.8 38.7* 39.3 39.8  MCV 98.7 98.4 97.8 97.0 97.5 97.3  PLT 185 208 185 180 196 628   Basic Metabolic Panel:  Recent Labs Lab 08/22/15 2022 08/23/15 0020 08/24/15 0533 2015-09-24 0432 08/26/15 0540  NA 136 132* 135 134* 133*  K 4.8 4.2 3.9 3.8 3.7  CL 98* 96* 97* 94* 96*  CO2 27 26 30 31 29   GLUCOSE 156* 203* 161* 182* 210*  BUN 10 10 9 11 14   CREATININE 0.86 0.89 0.87 0.90 1.02  CALCIUM 9.2 8.8* 8.7* 9.2 9.0  MG  --  1.6* 2.0  --   --   PHOS  --  2.2* 2.5  --   --    Liver Function Tests:  Recent Labs Lab 08/23/15 0020  AST 23  ALT 17  ALKPHOS 84  BILITOT 0.8  PROT 7.2  ALBUMIN 3.4*   No results for input(s): LIPASE, AMYLASE in the last 168 hours. No results for input(s): AMMONIA in the last 168 hours. Cardiac Enzymes:  Recent Labs Lab 09-24-2015 0934  CKTOTAL 161   BNP (last 3 results) No results for input(s): BNP in the last 8760 hours. CBG:  Recent Labs Lab Sep 24, 2015 1221 2015/09/24 1707 2015/09/24 2154 08/26/15 0752 08/26/15 1205  GLUCAP 150* 178* 142* 179* 206*   Time spent: 30 minutes  Signed:  Larkin Alfred  [facility] 08/26/2015 , 6:00 PM

## 2015-08-26 NOTE — Progress Notes (Signed)
Note written:  1900 on 08/26/15:  [name] wife called back after they had gotten home and stated that he would need a different work related note from the doctor to state that he would need to be out of work until 09/03/2015.  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  Dr. Posey Pronto notified and he put in a new letter for the pt.  This new letter was faxed to [phone] successfully and the wife was called at [phone] and notified that the letter had been faxed successfully.

## 2015-08-26 NOTE — Evaluation (Signed)
Physical Therapy Evaluation Patient Details Name: Louis Butler MRN: [id number] DOB: 1955/09/10 Today's Date: 08/26/2015   History of Present Illness  Patient was admitted on 08/22/2015, with complaint of right foot ulcer, was found to have cellulitis of the right foot.  Clinical Impression  Patient independent with all mobility.  Did recommend patient use crutches to decrease weight bearing on RLE to assist in healing.  Patient very concerned about returning to work (at times he reported he wanted to return to work immediately and then, at times, expressed concern about returning to work too soon).  Patient is safe with mobility from PT standpoint.  No further PT needs identified.    Follow Up Recommendations No PT follow up    Equipment Recommendations  Crutches (prn)    Recommendations for Other Services       Precautions / Restrictions Precautions Precautions: None      Mobility  Bed Mobility Overal bed mobility: Independent                Transfers Overall transfer level: Independent                  Ambulation/Gait Ambulation/Gait assistance: Modified independent (Device/Increase time) Ambulation Distance (Feet): 100 Feet Assistive device: None Gait Pattern/deviations: Decreased stance time - right Gait velocity: decreased   General Gait Details: discussed using crutches to decrease weight bearing and thus pain in right foot during gait.  Patient has crutches at home.   Stairs            Wheelchair Mobility    Modified Rankin (Stroke Patients Only)       Balance Overall balance assessment: No apparent balance deficits (not formally assessed)                                           Pertinent Vitals/Pain Pain Assessment: 0-10 Pain Score: 4  Pain Descriptors / Indicators: Sore Pain Intervention(s): Limited activity within patient's tolerance;Monitored during session    Home Living Family/patient expects to be  discharged to:: Private residence Living Arrangements: Spouse/significant other Available Help at Discharge: Family Type of Home: House Home Access: Stairs to enter Entrance Stairs-Rails: Right Entrance Stairs-Number of Steps: 3   Home Equipment: Crutches      Prior Function Level of Independence: Independent               Hand Dominance        Extremity/Trunk Assessment   Upper Extremity Assessment: Overall WFL for tasks assessed           Lower Extremity Assessment: Overall WFL for tasks assessed      Cervical / Trunk Assessment: Normal  Communication   Communication: No difficulties  Cognition Arousal/Alertness: Awake/alert Behavior During Therapy: WFL for tasks assessed/performed Overall Cognitive Status: Within Functional Limits for tasks assessed                      General Comments      Exercises        Assessment/Plan    PT Assessment Patent does not need any further PT services  PT Diagnosis Difficulty walking   PT Problem List    PT Treatment Interventions     PT Goals (Current goals can be found in the Care Plan section) Acute Rehab PT Goals PT Goal Formulation: All assessment and education complete, DC therapy  Frequency     Barriers to discharge        Co-evaluation               End of Session   Activity Tolerance: Patient tolerated treatment well Patient left: in bed;with family/visitor present;with call bell/phone within reach Nurse Communication: Mobility status         Time: [id number] PT Time Calculation (min) (ACUTE ONLY): 19 min   Charges:   PT Evaluation $PT Eval Low Complexity: 1 Procedure     PT G CodesShanna Cisco 08/26/2015, 11:03 AM  08/26/2015 [place], PT [phone]

## 2015-08-28 LAB — CULTURE, BLOOD (ROUTINE X 2)
CULTURE: NO GROWTH
CULTURE: NO GROWTH

## 2015-09-04 ENCOUNTER — Emergency Department (HOSPITAL_COMMUNITY)
Admission: EM | Admit: 2015-09-04 | Discharge: 2015-09-04 | Disposition: A | Discharge status: Home / Self Care | Payer: BLUE CROSS/BLUE SHIELD | Attending: Emergency Medicine | Admitting: Emergency Medicine

## 2015-09-04 ENCOUNTER — Encounter (HOSPITAL_COMMUNITY): Payer: Self-pay | Admitting: *Deleted

## 2015-09-04 DIAGNOSIS — Z5189 Encounter for other specified aftercare: Secondary | ICD-10-CM

## 2015-09-04 DIAGNOSIS — E119 Type 2 diabetes mellitus without complications: Secondary | ICD-10-CM | POA: Insufficient documentation

## 2015-09-04 DIAGNOSIS — Z4800 Encounter for change or removal of nonsurgical wound dressing: Secondary | ICD-10-CM | POA: Diagnosis present

## 2015-09-04 DIAGNOSIS — M79671 Pain in right foot: Secondary | ICD-10-CM | POA: Insufficient documentation

## 2015-09-04 DIAGNOSIS — Z791 Long term (current) use of non-steroidal anti-inflammatories (NSAID): Secondary | ICD-10-CM | POA: Diagnosis not present

## 2015-09-04 DIAGNOSIS — F17200 Nicotine dependence, unspecified, uncomplicated: Secondary | ICD-10-CM | POA: Insufficient documentation

## 2015-09-04 DIAGNOSIS — R600 Localized edema: Secondary | ICD-10-CM | POA: Diagnosis not present

## 2015-09-04 DIAGNOSIS — Z79899 Other long term (current) drug therapy: Secondary | ICD-10-CM | POA: Diagnosis not present

## 2015-09-04 DIAGNOSIS — I10 Essential (primary) hypertension: Secondary | ICD-10-CM | POA: Insufficient documentation

## 2015-09-04 LAB — BASIC METABOLIC PANEL
ANION GAP: 15 (ref 5–15)
BUN: 9 mg/dL (ref 6–20)
CHLORIDE: 99 mmol/L — AB (ref 101–111)
CO2: 20 mmol/L — AB (ref 22–32)
CREATININE: 0.8 mg/dL (ref 0.61–1.24)
Calcium: 9.9 mg/dL (ref 8.9–10.3)
GFR calc non Af Amer: >60 mL/min (ref >60)
Glucose, Bld: 179 mg/dL — ABNORMAL HIGH (ref 65–99)
POTASSIUM: 4.2 mmol/L (ref 3.5–5.1)
Sodium: 134 mmol/L — ABNORMAL LOW (ref 135–145)

## 2015-09-04 LAB — CBC
HCT: 43.8 % (ref 39.0–52.0)
HEMOGLOBIN: 15 g/dL (ref 13.0–17.0)
MCH: 33 pg (ref 26.0–34.0)
MCHC: 34.2 g/dL (ref 30.0–36.0)
MCV: 96.5 fL (ref 78.0–100.0)
Platelets: 218 10*3/uL (ref 150–400)
RBC: 4.54 MIL/uL (ref 4.22–5.81)
RDW: 12.3 % (ref 11.5–15.5)
WBC: 8.5 10*3/uL (ref 4.0–10.5)

## 2015-09-04 MED ORDER — METFORMIN HCL 500 MG PO TABS: 500.0000 mg | ORAL_TABLET | Freq: Two times a day (BID) | ORAL | Status: DC

## 2015-09-04 MED ORDER — AMLODIPINE BESYLATE 10 MG PO TABS: 10.0000 mg | ORAL_TABLET | Freq: Every day | ORAL | Status: DC

## 2015-09-04 NOTE — ED Provider Notes (Signed)
CSN: [id number]     Arrival date & time 09/04/15  1823 History   First MD Initiated Contact with Patient 09/04/15 2142     Chief Complaint  Patient presents with  . Foot Burn   The history is provided by the patient, the spouse and medical records.   History of hypertension diabetes (for evaluation would recheck. Patient had turned his right foot on a hot meal approximately 2 weeks ago. The wound got infected last week and is admitted for cellulitis. The redness has dissipated and the pain is improved. Swelling has been unchanged if not minimally improved. He denies any fevers chills nausea vomiting or generalized malaise. He's noted some scant clear thin discharge from the site without purulence. Cultures have been negative so far. Physical plan clindamycin. Here because he has been unable to get into a PCP clinic for re-check   Past Medical History  Diagnosis Date  . Diabetes mellitus without complication ([place])   . Hypertension    History reviewed. No pertinent past surgical history. Family History  Problem Relation Age of Onset  . Diabetes type II Mother   . Lung cancer Mother   . Hypertension Mother   . Diabetes type II Father   . Diabetes type II Sister   . Diabetes type II Brother   . Hypertension Brother   . CAD Neg Hx    Social History  Substance Use Topics  . Smoking status: Current Every Day Smoker  . Smokeless tobacco: None  . Alcohol Use: Yes     Comment: 12 pack a week, couple beers a day    Review of Systems  Constitutional: Negative for fever, chills, activity change and appetite change.  Respiratory: Negative for shortness of breath.   Gastrointestinal: Negative for nausea and vomiting.  Musculoskeletal: Negative for back pain.  Skin: Positive for wound. Negative for color change.  Neurological: Negative for weakness and numbness.  Psychiatric/Behavioral: Negative for confusion.  All other systems reviewed and are negative.   Allergies  Percocet  Home  Medications   Prior to Admission medications   Medication Sig Start Date End Date Taking? Authorizing Provider  diphenhydrAMINE (BENADRYL) 25 mg capsule Take 1 capsule (25 mg total) by mouth every 6 (six) hours as needed for itching. 08/26/15  Yes Lavina Hamman, MD  famotidine (PEPCID) 20 MG tablet Take 1 tablet (20 mg total) by mouth daily. 08/26/15  Yes Lavina Hamman, MD  nicotine (NICODERM CQ - DOSED IN MG/24 HOURS) 21 mg/24hr patch Place 1 patch (21 mg total) onto the skin daily. 08/26/15  Yes Lavina Hamman, MD  oxyCODONE-acetaminophen (PERCOCET) 5-325 MG tablet Take 1 tablet by mouth every 8 (eight) hours as needed for severe pain. 08/26/15  Yes Lavina Hamman, MD  saccharomyces boulardii (FLORASTOR) 250 MG capsule Take 1 capsule (250 mg total) by mouth 2 (two) times daily. Patient taking differently: Take 250 mg by mouth daily.  08/26/15  Yes Lavina Hamman, MD  silver sulfADIAZINE (SILVADENE) 1 % cream Apply 1 application topically daily. 08/20/15  Yes Delsa Grana, PA-C  UNKNOWN TO PATIENT Unknown antibiotic: Take 1 capsule by mouth twice daily for 10 days   Yes Historical Provider, MD  amLODipine (NORVASC) 10 MG tablet Take 1 tablet (10 mg total) by mouth daily. 09/04/15   Tammy Sours, MD  metFORMIN (GLUCOPHAGE) 500 MG tablet Take 1 tablet (500 mg total) by mouth 2 (two) times daily with a meal. 09/04/15   Tammy Sours, MD  naproxen (NAPROSYN) 500 MG tablet Take 1 tablet (500 mg total) by mouth 2 (two) times daily with a meal. 08/26/15   Lavina Hamman, MD   BP 162/83 mmHg  Pulse 94  Temp(Src) 98.9 F (37.2 C) (Oral)  Resp 18  SpO2 99% Physical Exam  Constitutional: He is oriented to person, place, and time. He appears well-developed and well-nourished. No distress.  HENT:  Head: Normocephalic and atraumatic.  Nose: Nose normal.  Eyes: Conjunctivae are normal.  Neck: Normal range of motion. Neck supple. No tracheal deviation present.  Cardiovascular: Normal rate, regular rhythm and  normal heart sounds.   No murmur heard. Excellent cap refill  Pulmonary/Chest: Effort normal and breath sounds normal. No respiratory distress. He has no rales.  Abdominal: Soft. Bowel sounds are normal. He exhibits no distension and no mass. There is no tenderness.  Musculoskeletal: Normal range of motion. He exhibits edema (1+ to right foot. ) and tenderness (at burn, minimal).  Neurological: He is alert and oriented to person, place, and time.  Skin: Skin is warm and dry. No rash noted.  Right anterior foot and 2nd toe with healing scab. No purulence. No streaking erythema (scant red border well localized and demarcated at wound edge as expected).  No flucutance. No odor.    Psychiatric: He has a normal mood and affect.  Nursing note and vitals reviewed.   ED Course  Procedures (including critical care time) Labs Review Labs Reviewed  BASIC METABOLIC PANEL - Abnormal; Notable for the following:    Sodium 134 (*)    Chloride 99 (*)    CO2 20 (*)    Glucose, Bld 179 (*)    All other components within normal limits  CBC    Imaging Review No results found. I have personally reviewed and evaluated these images and lab results as part of my medical decision-making.   EKG Interpretation None      MDM   Final diagnoses:  Encounter for wound re-check    I reviewed recent hospital course and images in the media tab. Wound healing appropriately. No signs of infection, abscess or streaking erythema. No signs of systemic toxicity. His vital signs are stable with pain is well-controlled. Gave him more contact information for PCP follow-up. Will complete previously prescribed antibiotic course. Patient family are pleased with this evaluation I do not have further concerns.    Tammy Sours, MD 09/04/15 [id number]  Julianne Rice, MD 09/05/15 1250

## 2015-09-04 NOTE — ED Notes (Signed)
Patient presents stating he is here to follow up with his right foot.  Has been seen and admitted for a burn to the top of his right foot, cellulitis and osteomylitis.  Has been taking his antibiotics as directed  Top of right foot has scab on it - no drainage at this time

## 2015-09-04 NOTE — ED Notes (Signed)
Discharge instructions reviewed - voiced understanding  Will make appointment with family practice ASAP

## 2015-09-04 NOTE — ED Notes (Signed)
Pt here for f/u on R foot burn, pt reports burning foot x 2 wks ago when he reports dropping a tv dinner on his foot, pt  Has 1 cm x 1 cm scab to 2nd toe on R foot 5 cm x 3 cm scab to top of right foot & 1 cm x 1 cm scab to R 3rd toe, pt ambulatory, pt has swelling to R foot, pt reports clear liquid drainage from the foot, pt able to wiggle toes,

## 2016-05-11 ENCOUNTER — Emergency Department (HOSPITAL_COMMUNITY): Payer: BLUE CROSS/BLUE SHIELD

## 2016-05-11 ENCOUNTER — Encounter (HOSPITAL_COMMUNITY): Payer: Self-pay | Admitting: *Deleted

## 2016-05-11 ENCOUNTER — Emergency Department (HOSPITAL_COMMUNITY)
Admission: EM | Admit: 2016-05-11 | Discharge: 2016-05-11 | Disposition: A | Discharge status: Home / Self Care | Payer: BLUE CROSS/BLUE SHIELD | Attending: Emergency Medicine | Admitting: Emergency Medicine

## 2016-05-11 DIAGNOSIS — M25552 Pain in left hip: Secondary | ICD-10-CM | POA: Diagnosis not present

## 2016-05-11 DIAGNOSIS — Z7984 Long term (current) use of oral hypoglycemic drugs: Secondary | ICD-10-CM | POA: Diagnosis not present

## 2016-05-11 DIAGNOSIS — F17200 Nicotine dependence, unspecified, uncomplicated: Secondary | ICD-10-CM | POA: Diagnosis not present

## 2016-05-11 DIAGNOSIS — Z79899 Other long term (current) drug therapy: Secondary | ICD-10-CM | POA: Diagnosis not present

## 2016-05-11 DIAGNOSIS — Y939 Activity, unspecified: Secondary | ICD-10-CM | POA: Diagnosis not present

## 2016-05-11 DIAGNOSIS — W19XXXA Unspecified fall, initial encounter: Secondary | ICD-10-CM

## 2016-05-11 DIAGNOSIS — M545 Low back pain: Secondary | ICD-10-CM | POA: Diagnosis present

## 2016-05-11 DIAGNOSIS — I10 Essential (primary) hypertension: Secondary | ICD-10-CM | POA: Diagnosis not present

## 2016-05-11 DIAGNOSIS — Y999 Unspecified external cause status: Secondary | ICD-10-CM | POA: Insufficient documentation

## 2016-05-11 DIAGNOSIS — W010XXA Fall on same level from slipping, tripping and stumbling without subsequent striking against object, initial encounter: Secondary | ICD-10-CM | POA: Diagnosis not present

## 2016-05-11 DIAGNOSIS — Y929 Unspecified place or not applicable: Secondary | ICD-10-CM | POA: Diagnosis not present

## 2016-05-11 DIAGNOSIS — E119 Type 2 diabetes mellitus without complications: Secondary | ICD-10-CM | POA: Diagnosis not present

## 2016-05-11 DIAGNOSIS — M5442 Lumbago with sciatica, left side: Secondary | ICD-10-CM | POA: Insufficient documentation

## 2016-05-11 LAB — CBG MONITORING, ED: Glucose-Capillary: 189 mg/dL — ABNORMAL HIGH (ref 65–99)

## 2016-05-11 MED ORDER — ACETAMINOPHEN 500 MG PO TABS
1000.0000 mg | ORAL_TABLET | Freq: Once | ORAL | Status: AC
  Administered 2016-05-11: 1000 mg via ORAL
  Filled 2016-05-11: qty 2

## 2016-05-11 MED ORDER — IBUPROFEN 400 MG PO TABS
600.0000 mg | ORAL_TABLET | Freq: Once | ORAL | Status: AC
  Administered 2016-05-11: 600 mg via ORAL
  Filled 2016-05-11: qty 1

## 2016-05-11 MED ORDER — AMLODIPINE BESYLATE 10 MG PO TABS: 10.0000 mg | ORAL_TABLET | Freq: Every day | ORAL | 0 refills | Status: DC

## 2016-05-11 MED ORDER — HYDROCODONE-ACETAMINOPHEN 5-325 MG PO TABS: 1.0000 | ORAL_TABLET | Freq: Four times a day (QID) | ORAL | 0 refills | Status: DC | PRN

## 2016-05-11 MED ORDER — METFORMIN HCL 500 MG PO TABS: 500.0000 mg | ORAL_TABLET | Freq: Two times a day (BID) | ORAL | 0 refills | Status: DC

## 2016-05-11 NOTE — ED Triage Notes (Signed)
CBG 189. 

## 2016-05-11 NOTE — Discharge Instructions (Addendum)
Your CT scan showed  Ct Lumbar Spine Wo Contrast  Result Date: 05/11/2016 CLINICAL DATA:  [place] last Friday and injured back.  Persistent pain. EXAM: CT LUMBAR SPINE WITHOUT CONTRAST TECHNIQUE: Multidetector CT imaging of the lumbar spine was performed without intravenous contrast administration. Multiplanar CT image reconstructions were also generated. COMPARISON:  None. FINDINGS: Segmentation: 5 lumbar type vertebral bodies. The last full intervertebral disc space is labeled L5-S1. Alignment: Normal Vertebrae: No acute fractures identified. The facets are normally aligned. No facet or laminar fractures. No pars defects. Paraspinal and other soft tissues: No significant findings. Age advanced atherosclerotic calcifications involving the aorta and branch vessel ostia. Moderately distended bladder. Disc levels: T12-L1:  No significant findings. L1-2:  No significant findings. L2-3:  No significant findings. L3-4: Bulging annulus and mild osteophytic ridging with mild bilateral lateral recess encroachment and mild right foraminal stenosis. Moderate facet disease. L4-5: Bulging annulus and osteophytic ridging with flattening of the ventral thecal sac and mild bilateral lateral recess stenosis. There is also mild bilateral foraminal stenosis, left greater than right due to bilateral foraminal disc protrusions. Moderate facet disease. L5-S1:  No significant findings.  Moderate facet disease. IMPRESSION: 1. Normal alignment and no acute bony findings. 2. 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 3. Mild right foraminal stenosis at L3-4 and bilateral foraminal stenosis at L4-5. Electronically Signed   By: Marijo Sanes M.D.   On: 05/11/2016 17:48        Please restart taking your blood pressure and diabetes medications.   Please take Percocet as directed for pain. It can make you sleepy so do not drive if you take it.  Please follow up with the Orthopedic doctor for further evaluation of your back/hip pain.

## 2016-05-11 NOTE — ED Notes (Signed)
Declined W/C at D/C and was escorted to lobby by RN. 

## 2016-05-11 NOTE — ED Triage Notes (Signed)
Pt reports falling last Friday and still has left hip pain.

## 2016-05-11 NOTE — ED Provider Notes (Signed)
[name] DEPT Provider Note   CSN: [id number] Arrival date & time: 05/11/16  1429  By signing my name below, I, Sonum Patel, attest that this documentation has been prepared under the direction and in the presence of Carmon Sails, PA-C. Electronically Signed: Sonum Patel, Education administrator. 05/11/16. 3:57 PM.  History   Chief Complaint Chief Complaint  Patient presents with  . Hip Pain  . Fall    The history is provided by the patient. No language interpreter was used.     HPI Comments: Louis Butler is a 61 y.o. male who presents to the Emergency Department complaining of constant left hip pain with radiation to the lower back that began after a fall 9 days ago. Patient states he slipped on wet mud and fell on grass; striking his left hip. He denies striking his head or LOC during the fall. He denies dizziness, lightheadedness, or CP prior to the fall. He states lying in bed for long periods of time and ambulation worsens his pain. He has tried ibuprofen and applied a heating pad without relief. He denies numbness, weakness, paresthesia. He denies history of prior hip surgeries. No bladder retention or incontinence.   Past Medical History:  Diagnosis Date  . Diabetes mellitus without complication ([place])   . Hypertension     Patient Active Problem List   Diagnosis Date Noted  . Wound, open, foot 08/22/2015  . Cellulitis 08/22/2015  . 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 Hypertension 08/22/2015    History reviewed. No pertinent surgical history.     Home Medications    Prior to Admission medications   Medication Sig Start Date End Date Taking? Authorizing Provider  amLODipine (NORVASC) 10 MG tablet Take 1 tablet (10 mg total) by mouth daily. 09/04/15   Tammy Sours, MD  diphenhydrAMINE (BENADRYL) 25 mg capsule Take 1 capsule (25 mg total) by mouth every 6 (six) hours as needed for itching. 08/26/15   Lavina Hamman, MD  famotidine (PEPCID) 20  MG tablet Take 1 tablet (20 mg total) by mouth daily. 08/26/15   Lavina Hamman, MD  metFORMIN (GLUCOPHAGE) 500 MG tablet Take 1 tablet (500 mg total) by mouth 2 (two) times daily with a meal. 09/04/15   Tammy Sours, MD  naproxen (NAPROSYN) 500 MG tablet Take 1 tablet (500 mg total) by mouth 2 (two) times daily with a meal. 08/26/15   Lavina Hamman, MD  nicotine (NICODERM CQ - DOSED IN MG/24 HOURS) 21 mg/24hr patch Place 1 patch (21 mg total) onto the skin daily. 08/26/15   Lavina Hamman, MD  oxyCODONE-acetaminophen (PERCOCET) 5-325 MG tablet Take 1 tablet by mouth every 8 (eight) hours as needed for severe pain. 08/26/15   Lavina Hamman, MD  saccharomyces boulardii (FLORASTOR) 250 MG capsule Take 1 capsule (250 mg total) by mouth 2 (two) times daily. Patient taking differently: Take 250 mg by mouth daily.  08/26/15   Lavina Hamman, MD  silver sulfADIAZINE (SILVADENE) 1 % cream Apply 1 application topically daily. 08/20/15   Delsa Grana, PA-C  UNKNOWN TO PATIENT Unknown antibiotic: Take 1 capsule by mouth twice daily for 10 days    Historical Provider, MD    Family History Family History  Problem Relation Age of Onset  . Diabetes type II Mother   . Lung cancer Mother   . Hypertension Mother   . Diabetes type II Father   . Diabetes type II Sister   .  Diabetes type II Brother   . Hypertension Brother   . CAD Neg Hx     Social History Social History  Substance Use Topics  . Smoking status: Current Every Day Smoker  . Smokeless tobacco: Not on file  . Alcohol use Yes     Comment: 12 pack a week, couple beers a day     Allergies   Percocet [oxycodone-acetaminophen]   Review of Systems Review of Systems  Constitutional: Negative for chills and fever.  HENT: Negative for congestion and sore throat.   Eyes: Negative for visual disturbance.  Respiratory: Negative for cough, chest tightness and shortness of breath.   Cardiovascular: Negative for chest pain.  Gastrointestinal:  Negative for abdominal pain, constipation, diarrhea, nausea and vomiting.  Genitourinary: Negative for decreased urine volume and difficulty urinating.  Musculoskeletal: Positive for arthralgias, back pain and gait problem. Negative for joint swelling.  Skin: Negative for rash.  Neurological: Negative for dizziness, syncope, weakness, light-headedness, numbness and headaches.     Physical Exam Updated Vital Signs BP 167/98 (BP Location: Left Arm)   Pulse 96   Temp 97.9 F (36.6 C) (Oral)   Resp 18   SpO2 100%   Physical Exam  Constitutional: He is oriented to person, place, and time. He appears well-developed and well-nourished. No distress.  NAD.  HENT:  Head: Normocephalic and atraumatic.  Right Ear: External ear normal.  Left Ear: External ear normal.  Nose: Nose normal.  Moist mucous membranes.  No nasal mucosa edema. Oropharynx and tonsils pink without erythema, edema, exudates or lesions.  Uvula midline. No trismus.   Eyes: Conjunctivae and EOM are normal. Pupils are equal, round, and reactive to light. No scleral icterus.  Neck: Normal range of motion. Neck supple. No JVD present. No tracheal deviation present.  Cardiovascular: Normal rate, regular rhythm, normal heart sounds and intact distal pulses.   No murmur heard. Pulmonary/Chest: Effort normal and breath sounds normal. He has no wheezes.  Abdominal: Soft. He exhibits no distension. There is no tenderness.  Musculoskeletal: Normal range of motion. He exhibits tenderness. He exhibits no deformity.  + left SLR, +Stinchfield on left Negative Faber bilaterally.  Left sciatic notch tenderness.  Antalgic, slow gait favoring left side.  Pt able to take 4+ steps in ED. Full active CTL spine ROM including flexion, extension, lateral bend and rotation.  No CTL spine midline tenderness.  No CTL paraspinal tenderness or increased tone. SI joints non tender.  Full passive hip, knee and ankle ROM bilaterally.     Lymphadenopathy:    He has no cervical adenopathy.  Neurological: He is alert and oriented to person, place, and time.  Antalgic gait favoring left side without foot drop.  5/5 strength with hip flexion and extension, bilaterally.  5/5 strength with knee flexion and extension, bilaterally.  5/5 strength with ankle dorsiflexion and plantar flexion, bilaterally.  Sensation to light touch intact in the distribution of the obturator nerve, lateral cutaneous nerve, femoral nerve, common fibular nerve.  2/4 knee and ankle DTR bilaterally.    Feet: sensation to light touch intact in the distribution of the saphenous nerve, lateral plantar nerve, bilaterally. Decreased sensation at medial plantar nerve bilaterally.  Skin: Skin is warm and dry. Capillary refill takes less than 2 seconds.  Psychiatric: He has a normal mood and affect. His behavior is normal. Judgment and thought content normal.  Nursing note and vitals reviewed.    ED Treatments / Results  DIAGNOSTIC STUDIES: Oxygen Saturation is 100%  on RA, normal by my interpretation.    COORDINATION OF CARE: 3:51 PM Discussed treatment plan with pt at bedside and pt agreed to plan.   Labs (all labs ordered are listed, but only abnormal results are displayed) Labs Reviewed  CBG MONITORING, ED - Abnormal; Notable for the following:       Result Value   Glucose-Capillary 189 (*)    All other components within normal limits    EKG  EKG Interpretation None       Radiology No results found.  Procedures Procedures (including critical care time)  Medications Ordered in ED Medications  acetaminophen (TYLENOL) tablet 1,000 mg (not administered)  ibuprofen (ADVIL,MOTRIN) tablet 600 mg (not administered)     Initial Impression / Assessment and Plan / ED Course  I have reviewed the triage vital signs and the nursing notes.  Pertinent labs & imaging results that were available during my care of the patient were reviewed by me and  considered in my medical decision making (see chart for details).     Final Clinical Impressions(s) / ED Diagnoses   Final diagnoses:  None   Patient is a 61 y.o. male with a hx of T2DM and HTN (patient stopped taking meds >1 year ago for both) who presents to the ED with left buttock pain s/p mechanical fall 9 days ago.  On exam pt has VSS, abdominal exam benign.  Musculoskeletal exam revealed antalgic gait favoring left side, +SLR and +Stinchfield in left with left sciatic notch tenderness and pain with left hip flexion.  Neurological exam of lower extremities normal.  Initial ddx include lumbar strain, psoas muscle strain or spasm, lumbar compression fracture, sciatic radiculopathy, and less likely ruptured disc, UTI/pyelo, PID, kidney stone, cauda equina or epidural abscess.  No red flag symptoms of back pain including: fecal incontinence, urinary retention or overflow incontinence, night sweats, waking from sleep with back pain, unexplained fevers or weight loss, h/o cancer, IVDU, recent trauma. No concern for cauda equina, epidural abscess, or other serious cause of back pain.   Pt given analgesics in ED. CT scan lumbar spine pending at shift change.  Pt handed off to oncoming APP Janit Bern who will follow up on CT scan and decide on disposition.   New Prescriptions New Prescriptions   No medications on file   I personally performed the services described in this documentation, which was scribed in my presence. The recorded information has been reviewed and is accurate.    Kinnie Feil, PA-C 05/11/16 1713    Merrily Pew, MD 05/11/16 [phone]

## 2017-02-06 ENCOUNTER — Emergency Department (HOSPITAL_BASED_OUTPATIENT_CLINIC_OR_DEPARTMENT_OTHER)
Admission: EM | Admit: 2017-02-06 | Discharge: 2017-02-06 | Disposition: A | Discharge status: Home / Self Care | Payer: BLUE CROSS/BLUE SHIELD | Attending: Emergency Medicine | Admitting: Emergency Medicine

## 2017-02-06 ENCOUNTER — Encounter (HOSPITAL_BASED_OUTPATIENT_CLINIC_OR_DEPARTMENT_OTHER): Payer: Self-pay | Admitting: Emergency Medicine

## 2017-02-06 DIAGNOSIS — R2241 Localized swelling, mass and lump, right lower limb: Secondary | ICD-10-CM | POA: Insufficient documentation

## 2017-02-06 DIAGNOSIS — Z5321 Procedure and treatment not carried out due to patient leaving prior to being seen by health care provider: Secondary | ICD-10-CM | POA: Diagnosis not present

## 2017-02-06 DIAGNOSIS — M79604 Pain in right leg: Secondary | ICD-10-CM | POA: Insufficient documentation

## 2017-02-06 NOTE — ED Triage Notes (Signed)
Onset x 11/2 weeks   Pain   Swelling  And redness to rt lower leg

## 2017-11-28 ENCOUNTER — Emergency Department (HOSPITAL_COMMUNITY): Payer: BLUE CROSS/BLUE SHIELD

## 2017-11-28 ENCOUNTER — Emergency Department (HOSPITAL_COMMUNITY)
Admission: EM | Admit: 2017-11-28 | Discharge: 2017-11-29 | Disposition: A | Discharge status: Home / Self Care | Payer: BLUE CROSS/BLUE SHIELD | Attending: Emergency Medicine | Admitting: Emergency Medicine

## 2017-11-28 ENCOUNTER — Other Ambulatory Visit: Payer: Self-pay

## 2017-11-28 ENCOUNTER — Encounter (HOSPITAL_COMMUNITY): Payer: Self-pay

## 2017-11-28 DIAGNOSIS — Z79899 Other long term (current) drug therapy: Secondary | ICD-10-CM | POA: Diagnosis not present

## 2017-11-28 DIAGNOSIS — Y929 Unspecified place or not applicable: Secondary | ICD-10-CM | POA: Diagnosis not present

## 2017-11-28 DIAGNOSIS — F1020 Alcohol dependence, uncomplicated: Secondary | ICD-10-CM | POA: Insufficient documentation

## 2017-11-28 DIAGNOSIS — F17200 Nicotine dependence, unspecified, uncomplicated: Secondary | ICD-10-CM | POA: Diagnosis not present

## 2017-11-28 DIAGNOSIS — F1090 Alcohol use, unspecified, uncomplicated: Secondary | ICD-10-CM

## 2017-11-28 DIAGNOSIS — R634 Abnormal weight loss: Secondary | ICD-10-CM

## 2017-11-28 DIAGNOSIS — I10 Essential (primary) hypertension: Secondary | ICD-10-CM | POA: Insufficient documentation

## 2017-11-28 DIAGNOSIS — S46911A Strain of unspecified muscle, fascia and tendon at shoulder and upper arm level, right arm, initial encounter: Secondary | ICD-10-CM | POA: Diagnosis not present

## 2017-11-28 DIAGNOSIS — S4991XA Unspecified injury of right shoulder and upper arm, initial encounter: Secondary | ICD-10-CM | POA: Diagnosis present

## 2017-11-28 DIAGNOSIS — Y999 Unspecified external cause status: Secondary | ICD-10-CM | POA: Insufficient documentation

## 2017-11-28 DIAGNOSIS — X500XXA Overexertion from strenuous movement or load, initial encounter: Secondary | ICD-10-CM | POA: Insufficient documentation

## 2017-11-28 DIAGNOSIS — Z7984 Long term (current) use of oral hypoglycemic drugs: Secondary | ICD-10-CM | POA: Diagnosis not present

## 2017-11-28 DIAGNOSIS — N3001 Acute cystitis with hematuria: Secondary | ICD-10-CM

## 2017-11-28 DIAGNOSIS — Y939 Activity, unspecified: Secondary | ICD-10-CM | POA: Diagnosis not present

## 2017-11-28 DIAGNOSIS — E119 Type 2 diabetes mellitus without complications: Secondary | ICD-10-CM | POA: Diagnosis not present

## 2017-11-28 DIAGNOSIS — Z789 Other specified health status: Secondary | ICD-10-CM

## 2017-11-28 HISTORY — DX: Cardiac arrhythmia, unspecified: I49.9

## 2017-11-28 LAB — URINALYSIS, MICROSCOPIC (REFLEX): WBC, UA: >50 WBC/hpf (ref 0–5)

## 2017-11-28 LAB — URINALYSIS, ROUTINE W REFLEX MICROSCOPIC
BILIRUBIN URINE: NEGATIVE
Glucose, UA: NEGATIVE mg/dL
KETONES UR: NEGATIVE mg/dL
NITRITE: NEGATIVE
PROTEIN: 100 mg/dL — AB
Specific Gravity, Urine: 1.003 — ABNORMAL LOW (ref 1.005–1.030)
pH: 6 (ref 5.0–8.0)

## 2017-11-28 MED ORDER — CEPHALEXIN 250 MG PO CAPS
500.0000 mg | ORAL_CAPSULE | Freq: Once | ORAL | Status: AC
  Administered 2017-11-28: 500 mg via ORAL
  Filled 2017-11-28: qty 2

## 2017-11-28 NOTE — ED Triage Notes (Signed)
Pt also c.o right shoulder pain that's been aching for "43months to a year"

## 2017-11-28 NOTE — ED Triage Notes (Signed)
Pt reports blood in his urine since 9pm last night. Endorses pain as well when urinating. Pt has hx of same.

## 2017-11-29 LAB — CBC WITH DIFFERENTIAL/PLATELET
ABS IMMATURE GRANULOCYTES: 0 10*3/uL (ref 0.0–0.1)
Basophils Absolute: 0.1 10*3/uL (ref 0.0–0.1)
Basophils Relative: 1 %
Eosinophils Absolute: 0.3 10*3/uL (ref 0.0–0.7)
Eosinophils Relative: 3 %
HEMATOCRIT: 45.8 % (ref 39.0–52.0)
Hemoglobin: 15.8 g/dL (ref 13.0–17.0)
IMMATURE GRANULOCYTES: 0 %
LYMPHS ABS: 1.9 10*3/uL (ref 0.7–4.0)
Lymphocytes Relative: 22 %
MCH: 36 pg — ABNORMAL HIGH (ref 26.0–34.0)
MCHC: 34.5 g/dL (ref 30.0–36.0)
MCV: 104.3 fL — ABNORMAL HIGH (ref 78.0–100.0)
Monocytes Absolute: 0.7 10*3/uL (ref 0.1–1.0)
Monocytes Relative: 8 %
NEUTROS ABS: 5.9 10*3/uL (ref 1.7–7.7)
Neutrophils Relative %: 66 %
Platelets: 221 10*3/uL (ref 150–400)
RBC: 4.39 MIL/uL (ref 4.22–5.81)
RDW: 13.2 % (ref 11.5–15.5)
WBC: 8.8 10*3/uL (ref 4.0–10.5)

## 2017-11-29 LAB — COMPREHENSIVE METABOLIC PANEL
ALBUMIN: 3.8 g/dL (ref 3.5–5.0)
ALK PHOS: 83 U/L (ref 38–126)
ALT: 16 U/L (ref 0–44)
AST: 23 U/L (ref 15–41)
Anion gap: 10 (ref 5–15)
BILIRUBIN TOTAL: 0.7 mg/dL (ref 0.3–1.2)
BUN: 12 mg/dL (ref 8–23)
CALCIUM: 9.5 mg/dL (ref 8.9–10.3)
CO2: 27 mmol/L (ref 22–32)
Chloride: 101 mmol/L (ref 98–111)
Creatinine, Ser: 1.2 mg/dL (ref 0.61–1.24)
GFR calc Af Amer: >60 mL/min (ref >60)
GFR calc non Af Amer: >60 mL/min (ref >60)
GLUCOSE: 118 mg/dL — AB (ref 70–99)
POTASSIUM: 4.5 mmol/L (ref 3.5–5.1)
SODIUM: 138 mmol/L (ref 135–145)
TOTAL PROTEIN: 7.9 g/dL (ref 6.5–8.1)

## 2017-11-29 LAB — PROTIME-INR
INR: 0.98
Prothrombin Time: 12.9 seconds (ref 11.4–15.2)

## 2017-11-29 MED ORDER — CEPHALEXIN 500 MG PO CAPS: 500.0000 mg | ORAL_CAPSULE | Freq: Three times a day (TID) | ORAL | 0 refills | Status: DC

## 2017-11-29 NOTE — Discharge Instructions (Signed)
We saw you in the emergency room for discomfort with urination and blood in the urine. We suspect that your symptoms are because of underlying infection.  Please start taking the antibiotics as prescribed. If your bleeding gets worse, or you start having vomiting, high-grade fevers and chills, please return to the ER.  As we discussed, it is prudent that you follow-up with the primary care doctor for optimal maintenance of your health.

## 2017-11-29 NOTE — ED Provider Notes (Signed)
[facility] EMERGENCY DEPARTMENT Provider Note   CSN: [id number] Arrival date & time: 11/28/17  2215     History   Chief Complaint Chief Complaint  Patient presents with  . Hematuria  . Shoulder Pain    HPI Louis Butler is a 62 y.o. male.  HPI 62 year old male comes in with chief complaint of shoulder pain and bloody urine.  Patient has been having right-sided shoulder pain for the past 6 months.  Pain is described as dull pain, worse with any kind of movement or when he is at work.  Patient denies any associated numbness, tingling, chest pain.  Additionally, for the past 2 days patient has been having some burning sensation with urination and also bloody urine.  Patient has had about 4 episodes of urine that is grossly bloody.   Of note, patient has not seen a physician in several years.  He has history of diabetes and hypertension, however not taking any medications.  Moreover, he reports that he is a chain smoker and drinks heavily every day.  He also reports about 20 pound weight loss in the last 6 months, wife thinks patient has lost even more weight.  She denies any bloody stools.  Past Medical History:  Diagnosis Date  . Diabetes mellitus without complication ([place])   . Hypertension   . Irregular heart beat     Patient Active Problem List   Diagnosis Date Noted  . Wound, open, foot 08/22/2015  . Cellulitis 08/22/2015  . DM type 2, uncontrolled, with lower extremity ulcer ([place]) 08/22/2015  . Hypertension 08/22/2015    History reviewed. No pertinent surgical history.      Home Medications    Prior to Admission medications   Medication Sig Start Date End Date Taking? Authorizing Provider  amLODipine (NORVASC) 10 MG tablet Take 1 tablet (10 mg total) by mouth daily. 05/11/16   Kinnie Feil, PA-C  cephALEXin (KEFLEX) 500 MG capsule Take 1 capsule (500 mg total) by mouth 3 (three) times daily. 11/29/17   Varney Biles, MD  metFORMIN  (GLUCOPHAGE) 500 MG tablet Take 1 tablet (500 mg total) by mouth 2 (two) times daily with a meal. 05/11/16   Kinnie Feil, PA-C    Family History Family History  Problem Relation Age of Onset  . Diabetes type II Mother   . Lung cancer Mother   . Hypertension Mother   . Diabetes type II Father   . Diabetes type II Sister   . Diabetes type II Brother   . Hypertension Brother   . CAD Neg Hx     Social History Social History   Tobacco Use  . Smoking status: Current Every Day Smoker  . Smokeless tobacco: Never Used  Substance Use Topics  . Alcohol use: Yes    Comment: 12 pack a week, couple beers a day  . Drug use: No     Allergies   Percocet [oxycodone-acetaminophen]   Review of Systems Review of Systems  Constitutional: Positive for activity change.  Respiratory: Negative for shortness of breath.   Cardiovascular: Negative for chest pain.  Gastrointestinal: Negative for abdominal pain, nausea and vomiting.  Genitourinary: Positive for dysuria and hematuria. Negative for flank pain.  Musculoskeletal: Positive for arthralgias.  Hematological: Does not bruise/bleed easily.  All other systems reviewed and are negative.    Physical Exam Updated Vital Signs BP (!) 162/116 (BP Location: Right Arm)   Pulse 88   Temp 98.3 F (36.8 C) (Oral)  Resp 16   Ht 6\' 3"  (1.905 m)   Wt 90.3 kg   SpO2 98%   BMI 24.87 kg/m   Physical Exam  Constitutional: He is oriented to person, place, and time. He appears well-developed.  HENT:  Head: Atraumatic.  Neck: Neck supple.  Cardiovascular: Normal rate.  Pulmonary/Chest: Effort normal.  Abdominal: Soft.  Neurological: He is alert and oriented to person, place, and time.  Skin: Skin is warm.  Nursing note and vitals reviewed.    ED Treatments / Results  Labs (all labs ordered are listed, but only abnormal results are displayed) Labs Reviewed  URINALYSIS, ROUTINE W REFLEX MICROSCOPIC - Abnormal; Notable for the  following components:      Result Value   Color, Urine RED (*)    APPearance CLOUDY (*)    Specific Gravity, Urine 1.003 (*)    Hgb urine dipstick LARGE (*)    Protein, ur 100 (*)    Leukocytes, UA LARGE (*)    All other components within normal limits  URINALYSIS, MICROSCOPIC (REFLEX) - Abnormal; Notable for the following components:   Bacteria, UA MANY (*)    All other components within normal limits  CBC WITH DIFFERENTIAL/PLATELET - Abnormal; Notable for the following components:   MCV 104.3 (*)    MCH 36.0 (*)    All other components within normal limits  COMPREHENSIVE METABOLIC PANEL - Abnormal; Notable for the following components:   Glucose, Bld 118 (*)    All other components within normal limits  URINE CULTURE  PROTIME-INR    EKG None  Radiology Dg Shoulder Right  Result Date: 11/28/2017 CLINICAL DATA:  RIGHT shoulder pain for 6 months to year. EXAM: RIGHT SHOULDER - 2+ VIEW COMPARISON:  None. FINDINGS: There is no evidence of fracture or dislocation. There is no evidence of arthropathy or other focal bone abnormality. Soft tissues are unremarkable. IMPRESSION: Negative. Electronically Signed   By: Franki Cabot M.D.   On: 11/28/2017 22:46    Procedures Procedures (including critical care time)  The patient was counseled on the dangers of tobacco use, and was advised to quit.  Reviewed strategies to maximize success, including removing cigarettes and smoking materials from environment, stress management, substitution of other forms of reinforcement and support of family/friends. Discussion 4 min.   Medications Ordered in ED Medications  cephALEXin (KEFLEX) capsule 500 mg (500 mg Oral Given 11/28/17 2354)     Initial Impression / Assessment and Plan / ED Course  I have reviewed the triage vital signs and the nursing notes.  Pertinent labs & imaging results that were available during my care of the patient were reviewed by me and considered in my medical decision  making (see chart for details).     62 year old male comes in with chief complaint of burning with urination with gross hematuria.  He does not have any flank pain, fevers, chills.  UA is consistent with a clinical diagnosis of UTI.  Culture sent because patient is a male.  Rectal pain, no fevers, chills and patient is nontoxic, I doubt that he has prostatitis.  Additionally, patient reports that he has been having shoulder pain.  X-rays are negative.  Based on exam it does not seem like a rotator cuff tendinitis.  No further work-up needed.  Finally, patient has had some weight loss and admits to heavy alcohol and cigarette smoking.  Patient has not seen a primary care doctor in years.  He has insurance and reports that he will follow-up  with [facility].  Patient is not ready to quit smoking or drinking at this time.  Final Clinical Impressions(s) / ED Diagnoses   Final diagnoses:  Acute cystitis with hematuria  Strain of right shoulder, initial encounter  Abnormal weight loss  Heavy alcohol consumption    ED Discharge Orders         Ordered    cephALEXin (KEFLEX) 500 MG capsule  3 times daily     11/29/17 0206           Varney Biles, MD 11/29/17 [phone]

## 2017-11-30 LAB — URINE CULTURE: CULTURE: NO GROWTH

## 2019-03-13 ENCOUNTER — Emergency Department (HOSPITAL_BASED_OUTPATIENT_CLINIC_OR_DEPARTMENT_OTHER): Payer: BC Managed Care – PPO

## 2019-03-13 ENCOUNTER — Other Ambulatory Visit: Payer: Self-pay

## 2019-03-13 ENCOUNTER — Inpatient Hospital Stay (HOSPITAL_BASED_OUTPATIENT_CLINIC_OR_DEPARTMENT_OTHER)
Admission: EM | Admit: 2019-03-13 | Discharge: 2019-03-28 | DRG: 683 | Disposition: A | Discharge status: Home / Self Care | Payer: BC Managed Care – PPO | Attending: Internal Medicine | Admitting: Internal Medicine

## 2019-03-13 ENCOUNTER — Encounter (HOSPITAL_BASED_OUTPATIENT_CLINIC_OR_DEPARTMENT_OTHER): Payer: Self-pay | Admitting: Emergency Medicine

## 2019-03-13 DIAGNOSIS — D649 Anemia, unspecified: Secondary | ICD-10-CM | POA: Diagnosis not present

## 2019-03-13 DIAGNOSIS — E1165 Type 2 diabetes mellitus with hyperglycemia: Secondary | ICD-10-CM | POA: Diagnosis not present

## 2019-03-13 DIAGNOSIS — E1122 Type 2 diabetes mellitus with diabetic chronic kidney disease: Secondary | ICD-10-CM | POA: Diagnosis present

## 2019-03-13 DIAGNOSIS — Z833 Family history of diabetes mellitus: Secondary | ICD-10-CM | POA: Diagnosis not present

## 2019-03-13 DIAGNOSIS — E872 Acidosis, unspecified: Secondary | ICD-10-CM | POA: Diagnosis present

## 2019-03-13 DIAGNOSIS — N1330 Unspecified hydronephrosis: Secondary | ICD-10-CM | POA: Diagnosis present

## 2019-03-13 DIAGNOSIS — Z885 Allergy status to narcotic agent status: Secondary | ICD-10-CM

## 2019-03-13 DIAGNOSIS — T380X5A Adverse effect of glucocorticoids and synthetic analogues, initial encounter: Secondary | ICD-10-CM | POA: Diagnosis not present

## 2019-03-13 DIAGNOSIS — F1020 Alcohol dependence, uncomplicated: Secondary | ICD-10-CM | POA: Diagnosis present

## 2019-03-13 DIAGNOSIS — Z9114 Patient's other noncompliance with medication regimen: Secondary | ICD-10-CM

## 2019-03-13 DIAGNOSIS — N3001 Acute cystitis with hematuria: Secondary | ICD-10-CM

## 2019-03-13 DIAGNOSIS — R21 Rash and other nonspecific skin eruption: Secondary | ICD-10-CM | POA: Diagnosis present

## 2019-03-13 DIAGNOSIS — E860 Dehydration: Secondary | ICD-10-CM | POA: Diagnosis present

## 2019-03-13 DIAGNOSIS — E871 Hypo-osmolality and hyponatremia: Secondary | ICD-10-CM | POA: Diagnosis present

## 2019-03-13 DIAGNOSIS — I10 Essential (primary) hypertension: Secondary | ICD-10-CM | POA: Diagnosis not present

## 2019-03-13 DIAGNOSIS — I70201 Unspecified atherosclerosis of native arteries of extremities, right leg: Secondary | ICD-10-CM | POA: Diagnosis present

## 2019-03-13 DIAGNOSIS — R040 Epistaxis: Secondary | ICD-10-CM | POA: Diagnosis present

## 2019-03-13 DIAGNOSIS — R339 Retention of urine, unspecified: Secondary | ICD-10-CM | POA: Diagnosis present

## 2019-03-13 DIAGNOSIS — D692 Other nonthrombocytopenic purpura: Secondary | ICD-10-CM | POA: Diagnosis not present

## 2019-03-13 DIAGNOSIS — Z801 Family history of malignant neoplasm of trachea, bronchus and lung: Secondary | ICD-10-CM | POA: Diagnosis not present

## 2019-03-13 DIAGNOSIS — N179 Acute kidney failure, unspecified: Principal | ICD-10-CM | POA: Diagnosis present

## 2019-03-13 DIAGNOSIS — N186 End stage renal disease: Secondary | ICD-10-CM | POA: Diagnosis present

## 2019-03-13 DIAGNOSIS — R338 Other retention of urine: Secondary | ICD-10-CM | POA: Diagnosis not present

## 2019-03-13 DIAGNOSIS — N182 Chronic kidney disease, stage 2 (mild): Secondary | ICD-10-CM | POA: Diagnosis present

## 2019-03-13 DIAGNOSIS — Z8249 Family history of ischemic heart disease and other diseases of the circulatory system: Secondary | ICD-10-CM

## 2019-03-13 DIAGNOSIS — E11622 Type 2 diabetes mellitus with other skin ulcer: Secondary | ICD-10-CM | POA: Diagnosis not present

## 2019-03-13 DIAGNOSIS — D631 Anemia in chronic kidney disease: Secondary | ICD-10-CM | POA: Diagnosis present

## 2019-03-13 DIAGNOSIS — N19 Unspecified kidney failure: Secondary | ICD-10-CM | POA: Diagnosis present

## 2019-03-13 DIAGNOSIS — I129 Hypertensive chronic kidney disease with stage 1 through stage 4 chronic kidney disease, or unspecified chronic kidney disease: Secondary | ICD-10-CM | POA: Diagnosis present

## 2019-03-13 DIAGNOSIS — Z20828 Contact with and (suspected) exposure to other viral communicable diseases: Secondary | ICD-10-CM | POA: Diagnosis present

## 2019-03-13 DIAGNOSIS — E1151 Type 2 diabetes mellitus with diabetic peripheral angiopathy without gangrene: Secondary | ICD-10-CM | POA: Diagnosis present

## 2019-03-13 DIAGNOSIS — E119 Type 2 diabetes mellitus without complications: Secondary | ICD-10-CM

## 2019-03-13 DIAGNOSIS — L97909 Non-pressure chronic ulcer of unspecified part of unspecified lower leg with unspecified severity: Secondary | ICD-10-CM | POA: Diagnosis not present

## 2019-03-13 DIAGNOSIS — Z720 Tobacco use: Secondary | ICD-10-CM | POA: Diagnosis not present

## 2019-03-13 DIAGNOSIS — E1129 Type 2 diabetes mellitus with other diabetic kidney complication: Secondary | ICD-10-CM

## 2019-03-13 DIAGNOSIS — Z888 Allergy status to other drugs, medicaments and biological substances status: Secondary | ICD-10-CM

## 2019-03-13 DIAGNOSIS — F17210 Nicotine dependence, cigarettes, uncomplicated: Secondary | ICD-10-CM | POA: Diagnosis present

## 2019-03-13 DIAGNOSIS — K5900 Constipation, unspecified: Secondary | ICD-10-CM | POA: Diagnosis present

## 2019-03-13 DIAGNOSIS — Z91148 Patient's other noncompliance with medication regimen for other reason: Secondary | ICD-10-CM

## 2019-03-13 HISTORY — DX: Urinary tract infection, site not specified: N39.0

## 2019-03-13 LAB — CBC WITH DIFFERENTIAL/PLATELET
Abs Immature Granulocytes: 0.05 10*3/uL (ref 0.00–0.07)
Basophils Absolute: 0 10*3/uL (ref 0.0–0.1)
Basophils Relative: 1 %
Eosinophils Absolute: 0.1 10*3/uL (ref 0.0–0.5)
Eosinophils Relative: 1 %
HCT: 29.1 % — ABNORMAL LOW (ref 39.0–52.0)
Hemoglobin: 9.7 g/dL — ABNORMAL LOW (ref 13.0–17.0)
Immature Granulocytes: 1 %
Lymphocytes Relative: 11 %
Lymphs Abs: 0.9 10*3/uL (ref 0.7–4.0)
MCH: 32.9 pg (ref 26.0–34.0)
MCHC: 33.3 g/dL (ref 30.0–36.0)
MCV: 98.6 fL (ref 80.0–100.0)
Monocytes Absolute: 0.4 10*3/uL (ref 0.1–1.0)
Monocytes Relative: 5 %
Neutro Abs: 6.8 10*3/uL (ref 1.7–7.7)
Neutrophils Relative %: 81 %
Platelets: 220 10*3/uL (ref 150–400)
RBC: 2.95 MIL/uL — ABNORMAL LOW (ref 4.22–5.81)
RDW: 14.6 % (ref 11.5–15.5)
WBC: 8.3 10*3/uL (ref 4.0–10.5)
nRBC: 0 % (ref 0.0–0.2)

## 2019-03-13 LAB — COMPREHENSIVE METABOLIC PANEL
ALT: 13 U/L (ref 0–44)
AST: 20 U/L (ref 15–41)
Albumin: 3 g/dL — ABNORMAL LOW (ref 3.5–5.0)
Alkaline Phosphatase: 98 U/L (ref 38–126)
Anion gap: 10 (ref 5–15)
BUN: 53 mg/dL — ABNORMAL HIGH (ref 8–23)
CO2: 17 mmol/L — ABNORMAL LOW (ref 22–32)
Calcium: 8.4 mg/dL — ABNORMAL LOW (ref 8.9–10.3)
Chloride: 104 mmol/L (ref 98–111)
Creatinine, Ser: 5.24 mg/dL — ABNORMAL HIGH (ref 0.61–1.24)
GFR calc Af Amer: 12 mL/min — ABNORMAL LOW (ref >60)
GFR calc non Af Amer: 11 mL/min — ABNORMAL LOW (ref >60)
Glucose, Bld: 144 mg/dL — ABNORMAL HIGH (ref 70–99)
Potassium: 4.6 mmol/L (ref 3.5–5.1)
Sodium: 131 mmol/L — ABNORMAL LOW (ref 135–145)
Total Bilirubin: 0.8 mg/dL (ref 0.3–1.2)
Total Protein: 7.9 g/dL (ref 6.5–8.1)

## 2019-03-13 LAB — URINALYSIS, ROUTINE W REFLEX MICROSCOPIC
Bilirubin Urine: NEGATIVE
Glucose, UA: NEGATIVE mg/dL
Ketones, ur: NEGATIVE mg/dL
Nitrite: NEGATIVE
Protein, ur: 30 mg/dL — AB
Specific Gravity, Urine: 1.015 (ref 1.005–1.030)
pH: 7.5 (ref 5.0–8.0)

## 2019-03-13 LAB — URINALYSIS, MICROSCOPIC (REFLEX)

## 2019-03-13 LAB — PROTIME-INR
INR: 1.1 (ref 0.8–1.2)
Prothrombin Time: 13.7 seconds (ref 11.4–15.2)

## 2019-03-13 MED ORDER — LORAZEPAM 1 MG PO TABS: 0.0000 mg | ORAL_TABLET | Freq: Four times a day (QID) | ORAL | Status: AC

## 2019-03-13 MED ORDER — LORAZEPAM 2 MG/ML IJ SOLN: 0.0000 mg | Freq: Four times a day (QID) | INTRAMUSCULAR | Status: AC

## 2019-03-13 MED ORDER — LABETALOL HCL 5 MG/ML IV SOLN
10.0000 mg | Freq: Once | INTRAVENOUS | Status: AC
  Administered 2019-03-13: 10 mg via INTRAVENOUS
  Filled 2019-03-13: qty 4

## 2019-03-13 MED ORDER — THIAMINE HCL 100 MG/ML IJ SOLN
100.0000 mg | Freq: Every day | INTRAMUSCULAR | Status: DC
  Filled 2019-03-13: qty 2

## 2019-03-13 MED ORDER — LORAZEPAM 2 MG/ML IJ SOLN: 0.0000 mg | Freq: Two times a day (BID) | INTRAMUSCULAR | Status: AC

## 2019-03-13 MED ORDER — LORAZEPAM 1 MG PO TABS: 0.0000 mg | ORAL_TABLET | Freq: Two times a day (BID) | ORAL | Status: AC

## 2019-03-13 MED ORDER — VITAMIN B-1 100 MG PO TABS
100.0000 mg | ORAL_TABLET | Freq: Every day | ORAL | Status: DC
  Administered 2019-03-14 – 2019-03-28 (×15): 100 mg via ORAL
  Filled 2019-03-13 (×15): qty 1

## 2019-03-13 MED ORDER — LIDOCAINE HCL URETHRAL/MUCOSAL 2 % EX GEL
1.0000 "application " | Freq: Once | CUTANEOUS | Status: AC
  Administered 2019-03-13: 1 via TOPICAL
  Filled 2019-03-13: qty 20

## 2019-03-13 MED ORDER — SODIUM CHLORIDE 0.9 % IV SOLN
1.0000 g | Freq: Once | INTRAVENOUS | Status: AC
  Administered 2019-03-13: 1 g via INTRAVENOUS
  Filled 2019-03-13: qty 10

## 2019-03-13 MED ORDER — SODIUM CHLORIDE 0.9 % IV BOLUS
1000.0000 mL | Freq: Once | INTRAVENOUS | Status: AC
  Administered 2019-03-13: 1000 mL via INTRAVENOUS

## 2019-03-13 NOTE — ED Notes (Signed)
Pt given light low Na snacks, gingerale to drink

## 2019-03-13 NOTE — ED Notes (Signed)
Patient transported to Ultrasound 

## 2019-03-13 NOTE — ED Provider Notes (Addendum)
[facility] EMERGENCY DEPARTMENT Provider Note   CSN: [id number] Arrival date & time: 03/13/19  1653    History   Chief Complaint Chief Complaint  Patient presents with  . Foot Pain    HPI Louis Butler is a 63 y.o. male with medical history significant for diabetes, hypertension who presents for evaluation of rash and leg pain.  Patient states he has had a persistent red rash to his bilateral lower extremities x3 to 4 weeks.  Patient states however he did notice 2 days ago he developed swelling to his right lower leg and some pain to his right calf.  States he has had some intermittent swelling to his left leg however does not have any pain to that.  States he is no longer followed by PCP and does not take any medications for his diabetes or his high blood pressure.  He denies fever, chills, nausea, vomiting, headache, blurred vision, neck pain, neck stiffness, chest pain, shortness of breath, hemoptysis, abdominal pain, diarrhea, dysuria.  Denies any alcohol or illicit drug use.  He does not take any medications on a regular basis.  No melena or bright red blood per rectum.  He denies warmth to his lower extremities or wound.  Denies additional aggravating or alleviating factors.  History obtained from patient and past medical records.  No interpreter is used.     HPI  Past Medical History:  Diagnosis Date  . Diabetes mellitus without complication ([place])   . Hypertension   . Irregular heart beat     Patient Active Problem List   Diagnosis Date Noted  . Acute renal failure (ARF) ([place]) 03/13/2019  . Wound, open, foot 08/22/2015  . Cellulitis 08/22/2015  . DM type 2, uncontrolled, with lower extremity ulcer ([place]) 08/22/2015  . Hypertension 08/22/2015    History reviewed. No pertinent surgical history.      Home Medications    Prior to Admission medications   Medication Sig Start Date End Date Taking? Authorizing Provider  amLODipine (NORVASC) 10 MG tablet Take  1 tablet (10 mg total) by mouth daily. 05/11/16   Kinnie Feil, PA-C  cephALEXin (KEFLEX) 500 MG capsule Take 1 capsule (500 mg total) by mouth 3 (three) times daily. 11/29/17   Varney Biles, MD  metFORMIN (GLUCOPHAGE) 500 MG tablet Take 1 tablet (500 mg total) by mouth 2 (two) times daily with a meal. 05/11/16   Kinnie Feil, PA-C    Family History Family History  Problem Relation Age of Onset  . Diabetes type II Mother   . Lung cancer Mother   . Hypertension Mother   . Diabetes type II Father   . Diabetes type II Sister   . Diabetes type II Brother   . Hypertension Brother   . CAD Neg Hx     Social History Social History   Tobacco Use  . Smoking status: Current Every Day Smoker  . Smokeless tobacco: Never Used  Substance Use Topics  . Alcohol use: Yes    Comment: 12 pack a week, couple beers a day  . Drug use: No     Allergies   Hydrocortisone and Percocet [oxycodone-acetaminophen]   Review of Systems Review of Systems  Constitutional: Negative.   HENT: Negative.   Respiratory: Negative.   Cardiovascular: Negative.   Gastrointestinal: Negative.   Genitourinary: Negative.   Musculoskeletal: Negative.   Skin: Positive for rash.  Neurological: Negative.   All other systems reviewed and are negative.  Physical Exam Updated Vital Signs BP (!) 147/111   Pulse 89   Temp 98.5 F (36.9 C) (Oral)   Resp 12   Ht 6\' 2"  (1.88 m)   Wt 92.5 kg   SpO2 96%   BMI 26.19 kg/m   Physical Exam Vitals signs and nursing note reviewed.  Constitutional:      General: He is not in acute distress.    Appearance: He is well-developed. He is not ill-appearing, toxic-appearing or diaphoretic.  HENT:     Head: Normocephalic and atraumatic.     Nose: Nose normal.     Mouth/Throat:     Mouth: Mucous membranes are moist.     Pharynx: Oropharynx is clear.  Eyes:     Pupils: Pupils are equal, round, and reactive to light.  Neck:     Musculoskeletal: Normal range  of motion and neck supple.  Cardiovascular:     Rate and Rhythm: Normal rate and regular rhythm.     Pulses: Normal pulses.     Heart sounds: Normal heart sounds.  Pulmonary:     Effort: Pulmonary effort is normal. No respiratory distress.     Breath sounds: Normal breath sounds.  Abdominal:     General: Bowel sounds are normal. There is no distension.     Palpations: Abdomen is soft.     Tenderness: There is no abdominal tenderness. There is no right CVA tenderness, left CVA tenderness, guarding or rebound.  Musculoskeletal: Normal range of motion.     Comments: Moves all 4 extremities without difficulty.  1+ pitting edema to right foot extending to mid shin.  Mild tenderness to palpation to right lower calf.  No swelling or edema to left calf.  Skin:    General: Skin is warm and dry.     Capillary Refill: Capillary refill takes less than 2 seconds.     Comments: Petechiae and purpura, change to bilateral lower extremities to mid shin.  Nontender to palpation.  No target lesions, blisters, desquamated skin, vesicles.  Neurological:     Mental Status: He is alert.     Comments: Intact sensation           ED Treatments / Results  Labs (all labs ordered are listed, but only abnormal results are displayed) Labs Reviewed  CBC WITH DIFFERENTIAL/PLATELET - Abnormal; Notable for the following components:      Result Value   RBC 2.95 (*)    Hemoglobin 9.7 (*)    HCT 29.1 (*)    All other components within normal limits  COMPREHENSIVE METABOLIC PANEL - Abnormal; Notable for the following components:   Sodium 131 (*)    CO2 17 (*)    Glucose, Bld 144 (*)    BUN 53 (*)    Creatinine, Ser 5.24 (*)    Calcium 8.4 (*)    Albumin 3.0 (*)    GFR calc non Af Amer 11 (*)    GFR calc Af Amer 12 (*)    All other components within normal limits  URINALYSIS, ROUTINE W REFLEX MICROSCOPIC - Abnormal; Notable for the following components:   APPearance CLOUDY (*)    Hgb urine dipstick  LARGE (*)    Protein, ur 30 (*)    Leukocytes,Ua LARGE (*)    All other components within normal limits  URINALYSIS, MICROSCOPIC (REFLEX) - Abnormal; Notable for the following components:   Bacteria, UA FEW (*)    All other components within normal limits  SARS CORONAVIRUS 2 (TAT  6-24 HRS)  PROTIME-INR    EKG EKG Interpretation  Date/Time:  Sunday March 13 2019 17:46:36 EST Ventricular Rate:  86 PR Interval:    QRS Duration: 93 QT Interval:  370 QTC Calculation: 443 R Axis:   -10 Text Interpretation: Sinus rhythm Multiple ventricular premature complexes Consider anterior infarct no prior available for comparison Confirmed by Quintella Reichert [phone]) on 03/13/2019 5:51:21 PM   Radiology US Renal  Result Date: 03/13/2019 CLINICAL DATA:  63 year old male with acute renal failure. History of diabetes. EXAM: RENAL / URINARY TRACT ULTRASOUND COMPLETE COMPARISON:  None. FINDINGS: Evaluation is limited as the patient was not able to cooperate with exam and due to portable technique. Right Kidney: Renal measurements: 12.6 x 4.4 x 4.1 cm = volume: 117 mL. There is moderate parenchyma atrophy. There is increased renal parenchymal echogenicity. There is a 3.3 x 2.4 x 3.1 cm upper pole cyst. There is moderate right hydronephrosis. No shadowing stone. Left Kidney: Renal measurements: 12.8 x 4.8 x 5.1 cm = volume: 164 ML. There is mild parenchyma atrophy and cortical thinning. There is increased renal parenchymal echogenicity. Mild-to-moderate hydronephrosis. No shadowing stone. Bladder: Not well visualized. Other: None. IMPRESSION: 1. Increased renal parenchyma echogenicity in keeping with chronic kidney disease. 2. Bilateral hydronephrosis, right greater left. No shadowing stone. Electronically Signed   By: Anner Crete M.D.   On: 03/13/2019 20:15   US Venous Img Lower Bilateral  Result Date: 03/13/2019 CLINICAL DATA:  Leg swelling right greater than left, calf pain and bilateral foot pain  EXAM: BILATERAL LOWER EXTREMITY VENOUS DOPPLER ULTRASOUND TECHNIQUE: Gray-scale sonography with graded compression, as well as color Doppler and duplex ultrasound were performed to evaluate the lower extremity deep venous systems from the level of the common femoral vein and including the common femoral, femoral, profunda femoral, popliteal and calf veins including the posterior tibial, peroneal and gastrocnemius veins when visible. The superficial great saphenous vein was also interrogated. Spectral Doppler was utilized to evaluate flow at rest and with distal augmentation maneuvers in the common femoral, femoral and popliteal veins. COMPARISON:  None FINDINGS: RIGHT LOWER EXTREMITY Common Femoral Vein: No evidence of thrombus. Normal compressibility, respiratory phasicity and response to augmentation. Saphenofemoral Junction: No evidence of thrombus. Normal compressibility and flow on color Doppler imaging. Profunda Femoral Vein: No evidence of thrombus. Normal compressibility and flow on color Doppler imaging. Femoral Vein: No evidence of thrombus. Normal compressibility, respiratory phasicity and response to augmentation. Popliteal Vein: No evidence of thrombus. Normal compressibility, respiratory phasicity and response to augmentation. Calf Veins: No evidence of thrombus. Normal compressibility and flow on color Doppler imaging. Superficial Great Saphenous Vein: No evidence of thrombus. Normal compressibility. Venous Reflux:  None. Other Findings:  None. LEFT LOWER EXTREMITY Common Femoral Vein: No evidence of thrombus. Normal compressibility, respiratory phasicity and response to augmentation. Saphenofemoral Junction: No evidence of thrombus. Normal compressibility and flow on color Doppler imaging. Profunda Femoral Vein: No evidence of thrombus. Normal compressibility and flow on color Doppler imaging. Femoral Vein: No evidence of thrombus. Normal compressibility, respiratory phasicity and response to  augmentation. Popliteal Vein: No evidence of thrombus. Normal compressibility, respiratory phasicity and response to augmentation. Calf Veins: No evidence of thrombus. Normal compressibility and flow on color Doppler imaging. Superficial Great Saphenous Vein: No evidence of thrombus. Normal compressibility. Venous Reflux:  None. Other Findings: Atheromatous plaque throughout visualized arterial vessels with abundant eccentric soft plaque in the right popliteal artery. IMPRESSION: No evidence of deep venous thrombosis in either lower extremity. Signs  of arterial vascular plaque with more pronounced changes incidentally noted in the right popliteal artery. Electronically Signed   By: Zetta Bills M.D.   On: 03/13/2019 19:03   Dg Chest Port 1 View  Result Date: 03/13/2019 CLINICAL DATA:  Renal failure.  Hypertension and smoking. EXAM: PORTABLE CHEST 1 VIEW COMPARISON:  None. FINDINGS: The heart size and mediastinal contours are within normal limits. Both lungs are clear. The visualized skeletal structures are unremarkable. IMPRESSION: No active disease. Electronically Signed   By: Constance Holster M.D.   On: 03/13/2019 20:36    Procedures Procedures (including critical care time)  Medications Ordered in ED Medications  labetalol (NORMODYNE) injection 10 mg (10 mg Intravenous Given 03/13/19 1840)  sodium chloride 0.9 % bolus 1,000 mL (0 mLs Intravenous Stopped 03/13/19 1941)  cefTRIAXone (ROCEPHIN) 1 g in sodium chloride 0.9 % 100 mL IVPB (0 g Intravenous Stopped 03/13/19 2016)  lidocaine (XYLOCAINE) 2 % jelly 1 application (1 application Topical Given 03/13/19 2016)     Initial Impression / Assessment and Plan / ED Course  I have reviewed the triage vital signs and the nursing notes.  Pertinent labs & imaging results that were available during my care of the patient were reviewed by me and considered in my medical decision making (see chart for details).   63 year old male appears otherwise well  presents for evaluation of rash to bilateral lower extremities.  Present over the last 3 to 4 weeks.  He also admits to right lower extremity swelling over the past 3 days.  No prior history of PE or DVT, denies chest pain, shortness of breath.  He was noted to be hypertensive on arrival to systolic 063.  He denies headache, dizziness, lightheadedness, chest pain or shortness of breath.  No nausea or vomiting.  He is history of diabetes and hypertension however is noncompliant with his medications.  Patient with diffuse petechiae to his lower extremities to his mid shins.  Nontender palpation.  He does have 1+ pitting edema to his right lower extremity to his mid shin.  Abdomen soft.  Heart and lungs clear.  Denies any alcohol or illicit drug use.  No melena or bright red blood per rectum.  No evidence of cellulitis or abscess to lower extremities.  CBC without leukocytosis, hemoglobin 9.7, previously fourteen 1 year ago.  Patient denies any melena or hematochezia.  Declined Hemoccult. Metabolic panel with mild hyponatremia to 131, glucose 144, BUN 53, creatinine 5.42.  GFR less than 11.  No prior history of CKD.  Denies any emesis.  He does admit to decreased urination. Will plan to obtain renal ultrasound, chest x-ray, duplex to rule out DVT.  EKG with PVC, no prior to compare.  He denies chest pain, shortness of breath.  Will need admitted for acute renal failure work-up.    CONSULT with Nephrology, Dr. Marval Regal who states patient would better be served at [facility] given if he needs dialysis he would need to be transferred there.  Bladder scan with >1000. Foley cath placed. Will consult with hospitalist for admission. Urinalysis positive for infection . Will culture and start on Rocephin for UTI.  Pressure with significant improvement with 1 dose of labetalol.  I have low suspicion for hypertensive urgency or emergency.  Clinical Course as of Mar 12 2209  Nancy Fetter Mar 13, 2019  1912 No DVT  US  Venous Img Lower Bilateral [[facility]]    Clinical Course User Index [[facility]] Giomar Gusler A, PA-C   CONSULT  with Dr. Marlowe Sax with [facility] who agrees to evaluate patient for admission. Patient will be transferred via [facility] to [facility]. US Renal and COVID pending.  1950: Patient with complaint of burning at foley insertion site. Will lido jelly. Patient put out 1500 form foley.  2045: No bed available tonight for [facility]. Will add on strict I/O. Patient will remain in the ED until bed available. Given patient is stable do not think he need emergent ED to ED transfer given full ED beds at [facility] at this time. Will plan to discuss with night shift attending so they are aware of patients bed status.  The patient appears reasonably stabilized for admission considering the current resources, flow, and capabilities available in the ED at this time, and I doubt any other [facility] requiring further screening and/or treatment in the ED prior to admission.  2311: Patient's wife asked to speak with me.  She states patient does frequently use alcohol, 12-18 beers daily.  No prior history of DTs or withdrawal seizures.  Patient had previously denied alcohol use to me and attending physician earlier today.  He does not appear actively in withdrawals at this time.  No tremors, no tachycardia.  Will place CIWA protocol for possible withdrawals.    Patient seen eval by attending physician, Dr. Ralene Bathe who agrees with the treatment, plan and disposition. Final Clinical Impressions(s) / ED Diagnoses   Final diagnoses:  Acute renal failure, unspecified acute renal failure type ([place])  Anemia, unspecified type  Hypertension, unspecified type  Acute cystitis with hematuria    ED Discharge Orders    None       Loucile Posner A, PA-C 03/13/19 2210    Shamieka Gullo A, PA-C 03/13/19 2315    Quintella Reichert, MD 03/15/19 1433

## 2019-03-13 NOTE — ED Triage Notes (Signed)
Pt reports bilateral foot pain and redness x 3 days.

## 2019-03-14 ENCOUNTER — Inpatient Hospital Stay (HOSPITAL_COMMUNITY): Payer: BC Managed Care – PPO

## 2019-03-14 ENCOUNTER — Encounter (HOSPITAL_COMMUNITY): Payer: Self-pay | Admitting: General Practice

## 2019-03-14 DIAGNOSIS — E11622 Type 2 diabetes mellitus with other skin ulcer: Secondary | ICD-10-CM

## 2019-03-14 DIAGNOSIS — L0390 Cellulitis, unspecified: Secondary | ICD-10-CM

## 2019-03-14 DIAGNOSIS — R21 Rash and other nonspecific skin eruption: Secondary | ICD-10-CM

## 2019-03-14 DIAGNOSIS — R040 Epistaxis: Secondary | ICD-10-CM

## 2019-03-14 DIAGNOSIS — Z9114 Patient's other noncompliance with medication regimen: Secondary | ICD-10-CM

## 2019-03-14 DIAGNOSIS — I10 Essential (primary) hypertension: Secondary | ICD-10-CM

## 2019-03-14 DIAGNOSIS — E1165 Type 2 diabetes mellitus with hyperglycemia: Secondary | ICD-10-CM

## 2019-03-14 DIAGNOSIS — N186 End stage renal disease: Secondary | ICD-10-CM

## 2019-03-14 DIAGNOSIS — Z720 Tobacco use: Secondary | ICD-10-CM

## 2019-03-14 DIAGNOSIS — L97909 Non-pressure chronic ulcer of unspecified part of unspecified lower leg with unspecified severity: Secondary | ICD-10-CM

## 2019-03-14 DIAGNOSIS — E871 Hypo-osmolality and hyponatremia: Secondary | ICD-10-CM

## 2019-03-14 DIAGNOSIS — N179 Acute kidney failure, unspecified: Secondary | ICD-10-CM | POA: Insufficient documentation

## 2019-03-14 HISTORY — DX: Cellulitis, unspecified: L03.90

## 2019-03-14 HISTORY — DX: End stage renal disease: N18.6

## 2019-03-14 LAB — BASIC METABOLIC PANEL
Anion gap: 8 (ref 5–15)
Anion gap: 6 (ref 5–15)
BUN: 56 mg/dL — ABNORMAL HIGH (ref 8–23)
BUN: 51 mg/dL — ABNORMAL HIGH (ref 8–23)
CO2: 18 mmol/L — ABNORMAL LOW (ref 22–32)
CO2: 18 mmol/L — ABNORMAL LOW (ref 22–32)
Calcium: 7.8 mg/dL — ABNORMAL LOW (ref 8.9–10.3)
Calcium: 8.1 mg/dL — ABNORMAL LOW (ref 8.9–10.3)
Chloride: 106 mmol/L (ref 98–111)
Chloride: 106 mmol/L (ref 98–111)
Creatinine, Ser: 4.93 mg/dL — ABNORMAL HIGH (ref 0.61–1.24)
Creatinine, Ser: 4.8 mg/dL — ABNORMAL HIGH (ref 0.61–1.24)
GFR calc Af Amer: 13 mL/min — ABNORMAL LOW (ref >60)
GFR calc Af Amer: 14 mL/min — ABNORMAL LOW (ref >60)
GFR calc non Af Amer: 12 mL/min — ABNORMAL LOW (ref >60)
GFR calc non Af Amer: 12 mL/min — ABNORMAL LOW (ref >60)
Glucose, Bld: 214 mg/dL — ABNORMAL HIGH (ref 70–99)
Glucose, Bld: 152 mg/dL — ABNORMAL HIGH (ref 70–99)
Potassium: 4.5 mmol/L (ref 3.5–5.1)
Potassium: 4.9 mmol/L (ref 3.5–5.1)
Sodium: 132 mmol/L — ABNORMAL LOW (ref 135–145)
Sodium: 130 mmol/L — ABNORMAL LOW (ref 135–145)

## 2019-03-14 LAB — CBC WITH DIFFERENTIAL/PLATELET
Abs Immature Granulocytes: 0.05 10*3/uL (ref 0.00–0.07)
Basophils Absolute: 0 10*3/uL (ref 0.0–0.1)
Basophils Relative: 0 %
Eosinophils Absolute: 0.4 10*3/uL (ref 0.0–0.5)
Eosinophils Relative: 5 %
HCT: 25.5 % — ABNORMAL LOW (ref 39.0–52.0)
Hemoglobin: 8.3 g/dL — ABNORMAL LOW (ref 13.0–17.0)
Immature Granulocytes: 1 %
Lymphocytes Relative: 15 %
Lymphs Abs: 1.2 10*3/uL (ref 0.7–4.0)
MCH: 32.8 pg (ref 26.0–34.0)
MCHC: 32.5 g/dL (ref 30.0–36.0)
MCV: 100.8 fL — ABNORMAL HIGH (ref 80.0–100.0)
Monocytes Absolute: 0.4 10*3/uL (ref 0.1–1.0)
Monocytes Relative: 6 %
Neutro Abs: 5.5 10*3/uL (ref 1.7–7.7)
Neutrophils Relative %: 73 %
Platelets: 180 10*3/uL (ref 150–400)
RBC: 2.53 MIL/uL — ABNORMAL LOW (ref 4.22–5.81)
RDW: 14.8 % (ref 11.5–15.5)
WBC: 7.5 10*3/uL (ref 4.0–10.5)
nRBC: 0 % (ref 0.0–0.2)

## 2019-03-14 LAB — IRON AND TIBC
Iron: 63 ug/dL (ref 45–182)
Saturation Ratios: 38 % (ref 17.9–39.5)
TIBC: 165 ug/dL — ABNORMAL LOW (ref 250–450)
UIBC: 102 ug/dL

## 2019-03-14 LAB — GLUCOSE, CAPILLARY
Glucose-Capillary: 112 mg/dL — ABNORMAL HIGH (ref 70–99)
Glucose-Capillary: 96 mg/dL (ref 70–99)

## 2019-03-14 LAB — HEMOGLOBIN A1C
Hgb A1c MFr Bld: 6.2 % — ABNORMAL HIGH (ref 4.8–5.6)
Mean Plasma Glucose: 131.24 mg/dL

## 2019-03-14 LAB — FERRITIN: Ferritin: 283 ng/mL (ref 24–336)

## 2019-03-14 LAB — SEDIMENTATION RATE: Sed Rate: 76 mm/hr — ABNORMAL HIGH (ref 0–16)

## 2019-03-14 LAB — CBG MONITORING, ED: Glucose-Capillary: 111 mg/dL — ABNORMAL HIGH (ref 70–99)

## 2019-03-14 LAB — SARS CORONAVIRUS 2 (TAT 6-24 HRS): SARS Coronavirus 2: NEGATIVE

## 2019-03-14 LAB — C-REACTIVE PROTEIN: CRP: 4.6 mg/dL — ABNORMAL HIGH (ref <1.0)

## 2019-03-14 MED ORDER — HYDRALAZINE HCL 20 MG/ML IJ SOLN
10.0000 mg | Freq: Four times a day (QID) | INTRAMUSCULAR | Status: DC | PRN
  Filled 2019-03-14: qty 0.5

## 2019-03-14 MED ORDER — ONDANSETRON HCL 4 MG PO TABS: 4.0000 mg | ORAL_TABLET | Freq: Four times a day (QID) | ORAL | Status: DC | PRN

## 2019-03-14 MED ORDER — AMLODIPINE BESYLATE 5 MG PO TABS
2.5000 mg | ORAL_TABLET | Freq: Every day | ORAL | Status: DC
  Administered 2019-03-14 – 2019-03-28 (×15): 2.5 mg via ORAL
  Filled 2019-03-14 (×15): qty 1

## 2019-03-14 MED ORDER — INSULIN ASPART 100 UNIT/ML ~~LOC~~ SOLN
0.0000 [IU] | Freq: Every day | SUBCUTANEOUS | Status: DC
  Administered 2019-03-22: 4 [IU] via SUBCUTANEOUS
  Administered 2019-03-23 – 2019-03-24 (×2): 3 [IU] via SUBCUTANEOUS

## 2019-03-14 MED ORDER — SODIUM CHLORIDE 0.9 % IV SOLN
1.0000 g | Freq: Once | INTRAVENOUS | Status: AC
  Administered 2019-03-14: 1 g via INTRAVENOUS
  Filled 2019-03-14: qty 1
  Filled 2019-03-14: qty 10

## 2019-03-14 MED ORDER — SODIUM CHLORIDE 0.9 % IV SOLN
Freq: Once | INTRAVENOUS | Status: AC
  Administered 2019-03-14: 11:00:00 via INTRAVENOUS

## 2019-03-14 MED ORDER — CHLORHEXIDINE GLUCONATE CLOTH 2 % EX PADS
6.0000 | MEDICATED_PAD | Freq: Every day | CUTANEOUS | Status: DC
  Administered 2019-03-15 – 2019-03-28 (×13): 6 via TOPICAL

## 2019-03-14 MED ORDER — INSULIN ASPART 100 UNIT/ML ~~LOC~~ SOLN
0.0000 [IU] | Freq: Three times a day (TID) | SUBCUTANEOUS | Status: DC
  Administered 2019-03-15 (×3): 1 [IU] via SUBCUTANEOUS
  Administered 2019-03-16: 2 [IU] via SUBCUTANEOUS
  Administered 2019-03-16 – 2019-03-17 (×3): 1 [IU] via SUBCUTANEOUS
  Administered 2019-03-19: 2 [IU] via SUBCUTANEOUS
  Administered 2019-03-19 – 2019-03-20 (×2): 1 [IU] via SUBCUTANEOUS
  Administered 2019-03-20: 2 [IU] via SUBCUTANEOUS
  Administered 2019-03-21 (×2): 1 [IU] via SUBCUTANEOUS
  Administered 2019-03-22: 3 [IU] via SUBCUTANEOUS
  Administered 2019-03-22: 1 [IU] via SUBCUTANEOUS
  Administered 2019-03-23 (×2): 3 [IU] via SUBCUTANEOUS
  Administered 2019-03-23: 2 [IU] via SUBCUTANEOUS
  Administered 2019-03-24: 1 [IU] via SUBCUTANEOUS
  Administered 2019-03-24: 7 [IU] via SUBCUTANEOUS
  Administered 2019-03-24 – 2019-03-25 (×2): 2 [IU] via SUBCUTANEOUS
  Administered 2019-03-25: 7 [IU] via SUBCUTANEOUS
  Administered 2019-03-25: 1 [IU] via SUBCUTANEOUS

## 2019-03-14 MED ORDER — ONDANSETRON HCL 4 MG/2ML IJ SOLN
4.0000 mg | Freq: Four times a day (QID) | INTRAMUSCULAR | Status: DC | PRN
  Administered 2019-03-17: 4 mg via INTRAVENOUS
  Filled 2019-03-14: qty 2

## 2019-03-14 MED ORDER — AMLODIPINE BESYLATE 5 MG PO TABS: 5.0000 mg | ORAL_TABLET | Freq: Every day | ORAL | Status: DC

## 2019-03-14 NOTE — ED Provider Notes (Signed)
Brief update note  Reason: Rounded on admitted patient  Summary:  63 year old male presented to ER with lower leg rash.  Clinical concern for  petechiae.  Labs concerning for acute renal failure.  Bladder scan showed greater than thousand, Foley placed for urinary retention.  UA positive for infection.  Nephrology consulted, recommending hospitalist admission to [facility].  On assessment this morning patient feels well, asymptomatic.  Has had good p.o. intake, urine output.  Plan:  Repeat CBC, BMP MIVF at 144mL/hr Continue rocephin q24 hr for UTI Continue plan for hospitalist admission   Lucrezia Starch, MD 03/14/19 1017

## 2019-03-14 NOTE — Plan of Care (Signed)
°  Problem: Coping: °Goal: Level of anxiety will decrease °Outcome: Progressing °  °

## 2019-03-14 NOTE — H&P (Addendum)
History and Physical    DOA: 03/13/2019  PCP: Patient, [name]  Patient coming from: home  Chief Complaint: rash on legs  HPI: Louis Butler is a 63 y.o. male with history h/o diabetes mellitus, hypertension,?  arrhythmia, UTI who has not seen a PCP for at least 2 years and not on any medications at home presented to [facility] in concern for lower extremity rash that has been ongoing and worsening over last 3 to 4 weeks.  He reports noticing bilateral lower extremity swellings right greater than left 2 days back associated with right calf pain on walking. Lab work at [facility] revealed drop in hemoglobin from 15->9 as well as AKI with creatinine jump from 1.2 --> 5.24 since August 2019.  WBC/platelets appear to be WNL.  Patient noted to have urinary retention and Foley catheter placed with 1000 mL urine output.  Patient denies any melena/hematochezia or hematemesis or hematuria.  UA revealed large leukocytes, large hemoglobin, protein 30, 11-20 RBC, few bacteria and 21-50 WBC.  Patient started on IV fluids/IV ceftriaxone and transferred to [facility] after discussing with nephrology on-call.  He apparently declined rectal exam at outside ED.   Upon arrival to the floor, patient eager to eat.  He denies any history of melena or hematochezia but does report epistaxis for the last 1 week through the right nostril.  He denies any chest pain or dyspnea on exertion.  He does report urinary hesitancy and flow incontinence for few months.  Denies any hematuria or abdominal pain or flank pain.   Review of Systems: As per HPI otherwise 10 point review of systems negative.    Past Medical History:  Diagnosis Date  . ARF (acute renal failure) ([place]) 03/14/2019  . Cellulitis 03/14/2019   feet  . Diabetes mellitus without complication ([place])   . Hypertension   . Irregular heart beat   . UTI (urinary tract infection)     Past Surgical History:  Procedure Laterality Date  . WISDOM TOOTH EXTRACTION       Social history:  reports that he has been smoking cigarettes. He has never used smokeless tobacco. He reports current alcohol use. He reports that he does not use drugs.  Smokes 1 pack/day   Allergies  Allergen Reactions  . Hydrocodone Hives and Itching  . Percocet [Oxycodone-Acetaminophen] Hives and Itching    Family History  Problem Relation Age of Onset  . Diabetes type II Mother   . Lung cancer Mother   . Hypertension Mother   . Diabetes type II Father   . Diabetes type II Sister   . Diabetes type II Brother   . Hypertension Brother   . CAD Neg Hx       Prior to Admission medications   Medication Sig Start Date End Date Taking? Authorizing Provider  ibuprofen (ADVIL) 200 MG tablet Take 800 mg by mouth every 6 (six) hours as needed (pain).   Yes [provider]  amLODipine (NORVASC) 10 MG tablet Take 1 tablet (10 mg total) by mouth daily. Patient not taking: Reported on 03/14/2019 05/11/16   Kinnie Feil, PA-C  metFORMIN (GLUCOPHAGE) 500 MG tablet Take 1 tablet (500 mg total) by mouth 2 (two) times daily with a meal. Patient not taking: Reported on 03/14/2019 05/11/16   Kinnie Feil, PA-C    Physical Exam: Vitals:   03/14/19 1300 03/14/19 1500 03/14/19 1541 03/14/19 1705  BP: (!) 143/71 137/71 137/71 (!) 162/88  Pulse: 88  66 72 85  Resp: 18 17  16   Temp:    98.6 F (37 C)  TempSrc:    Oral  SpO2: 98% 100%  99%  Weight:      Height:        Constitutional: NAD, calm, comfortable Eyes: PERRL, lids and conjunctivae normal ENMT: Mucous membranes are moist. Posterior pharynx clear of any exudate or lesions.Normal dentition.  Neck: normal, supple, no masses, no thyromegaly Respiratory: clear to auscultation bilaterally, no wheezing, no crackles. Normal respiratory effort. No accessory muscle use.  Cardiovascular: Regular rate and rhythm, no murmurs / rubs / gallops. No extremity edema.  Appears to have good posterior tibial pulses but weak pedal  pulses.  Abdomen: Soft, ND, no tenderness, No hepatosplenomegaly. Bowel sounds positive.  Musculoskeletal: no clubbing / cyanosis. No joint deformity upper and lower extremities. Good ROM, no contractures. Normal muscle tone.  Neurologic: CN 2-12 grossly intact. Sensation intact, DTR normal. Strength 5/5 in all 4.  Psychiatric: Normal judgment and insight. Alert and oriented x 3. Normal mood.  SKIN/catheters: Purpuric appearing maculopapular nonblanching rash in bilateral feet/lower third of lower extremities.  Mild discoloration of right third toe as shown below    Labs on Admission: I have personally reviewed following labs and imaging studies    CBC: Recent Labs  Lab Mar 29, 2019 1745 03/14/19 1052  WBC 8.3 7.5  NEUTROABS 6.8 5.5  HGB 9.7* 8.3*  HCT 29.1* 25.5*  MCV 98.6 100.8*  PLT 220 836   Basic Metabolic Panel: Recent Labs  Lab 03/29/19 1745 03/14/19 1052  NA 131* 132*  K 4.6 4.5  CL 104 106  CO2 17* 18*  GLUCOSE 144* 214*  BUN 53* 56*  CREATININE 5.24* 4.93*  CALCIUM 8.4* 7.8*   GFR: Estimated Creatinine Clearance: 17.8 mL/min (A) (by C-G formula based on SCr of 4.93 mg/dL (H)). Liver Function Tests: Recent Labs  Lab March 29, 2019 1745  AST 20  ALT 13  ALKPHOS 98  BILITOT 0.8  PROT 7.9  ALBUMIN 3.0*   No results for input(s): LIPASE, AMYLASE in the last 168 hours. No results for input(s): AMMONIA in the last 168 hours. Coagulation Profile: Recent Labs  Lab 2019/03/29 1745  INR 1.1   Cardiac Enzymes: No results for input(s): CKTOTAL, CKMB, CKMBINDEX, TROPONINI in the last 168 hours. BNP (last 3 results) No results for input(s): PROBNP in the last 8760 hours. HbA1C: No results for input(s): HGBA1C in the last 72 hours. CBG: Recent Labs  Lab 03/14/19 1541 03/14/19 1701  GLUCAP 111* 112*   Lipid Profile: No results for input(s): CHOL, HDL, LDLCALC, TRIG, CHOLHDL, LDLDIRECT in the last 72 hours. Thyroid Function Tests: No results for input(s): TSH,  T4TOTAL, FREET4, T3FREE, THYROIDAB in the last 72 hours. Anemia Panel: No results for input(s): VITAMINB12, FOLATE, FERRITIN, TIBC, IRON, RETICCTPCT in the last 72 hours. Urine analysis:    Component Value Date/Time   COLORURINE YELLOW 03-29-2019 1912   APPEARANCEUR CLOUDY (A) 03/29/19 1912   LABSPEC 1.015 March 29, 2019 1912   PHURINE 7.5 Mar 29, 2019 1912   GLUCOSEU NEGATIVE 2019/03/29 1912   HGBUR LARGE (A) 03-29-2019 [facility] NEGATIVE 03/29/19 [place] 29-Mar-2019 1912   PROTEINUR 30 (A) 2019/03/29 1912   NITRITE NEGATIVE 03-29-2019 1912   LEUKOCYTESUR LARGE (A) 03-29-2019 1912    Radiological Exams on Admission: Personally reviewed  US Renal  Result Date: 03/29/2019 CLINICAL DATA:  63 year old male with acute renal failure. History of diabetes. EXAM: RENAL / URINARY TRACT  ULTRASOUND COMPLETE COMPARISON:  None. FINDINGS: Evaluation is limited as the patient was not able to cooperate with exam and due to portable technique. Right Kidney: Renal measurements: 12.6 x 4.4 x 4.1 cm = volume: 117 mL. There is moderate parenchyma atrophy. There is increased renal parenchymal echogenicity. There is a 3.3 x 2.4 x 3.1 cm upper pole cyst. There is moderate right hydronephrosis. No shadowing stone. Left Kidney: Renal measurements: 12.8 x 4.8 x 5.1 cm = volume: 164 ML. There is mild parenchyma atrophy and cortical thinning. There is increased renal parenchymal echogenicity. Mild-to-moderate hydronephrosis. No shadowing stone. Bladder: Not well visualized. Other: None. IMPRESSION: 1. Increased renal parenchyma echogenicity in keeping with chronic kidney disease. 2. Bilateral hydronephrosis, right greater left. No shadowing stone. Electronically Signed   By: Anner Crete M.D.   On: 03/13/2019 20:15   US Venous Img Lower Bilateral  Result Date: 03/13/2019 CLINICAL DATA:  Leg swelling right greater than left, calf pain and bilateral foot pain EXAM: BILATERAL LOWER EXTREMITY  VENOUS DOPPLER ULTRASOUND TECHNIQUE: Gray-scale sonography with graded compression, as well as color Doppler and duplex ultrasound were performed to evaluate the lower extremity deep venous systems from the level of the common femoral vein and including the common femoral, femoral, profunda femoral, popliteal and calf veins including the posterior tibial, peroneal and gastrocnemius veins when visible. The superficial great saphenous vein was also interrogated. Spectral Doppler was utilized to evaluate flow at rest and with distal augmentation maneuvers in the common femoral, femoral and popliteal veins. COMPARISON:  None FINDINGS: RIGHT LOWER EXTREMITY Common Femoral Vein: No evidence of thrombus. Normal compressibility, respiratory phasicity and response to augmentation. Saphenofemoral Junction: No evidence of thrombus. Normal compressibility and flow on color Doppler imaging. Profunda Femoral Vein: No evidence of thrombus. Normal compressibility and flow on color Doppler imaging. Femoral Vein: No evidence of thrombus. Normal compressibility, respiratory phasicity and response to augmentation. Popliteal Vein: No evidence of thrombus. Normal compressibility, respiratory phasicity and response to augmentation. Calf Veins: No evidence of thrombus. Normal compressibility and flow on color Doppler imaging. Superficial Great Saphenous Vein: No evidence of thrombus. Normal compressibility. Venous Reflux:  None. Other Findings:  None. LEFT LOWER EXTREMITY Common Femoral Vein: No evidence of thrombus. Normal compressibility, respiratory phasicity and response to augmentation. Saphenofemoral Junction: No evidence of thrombus. Normal compressibility and flow on color Doppler imaging. Profunda Femoral Vein: No evidence of thrombus. Normal compressibility and flow on color Doppler imaging. Femoral Vein: No evidence of thrombus. Normal compressibility, respiratory phasicity and response to augmentation. Popliteal Vein: No  evidence of thrombus. Normal compressibility, respiratory phasicity and response to augmentation. Calf Veins: No evidence of thrombus. Normal compressibility and flow on color Doppler imaging. Superficial Great Saphenous Vein: No evidence of thrombus. Normal compressibility. Venous Reflux:  None. Other Findings: Atheromatous plaque throughout visualized arterial vessels with abundant eccentric soft plaque in the right popliteal artery. IMPRESSION: No evidence of deep venous thrombosis in either lower extremity. Signs of arterial vascular plaque with more pronounced changes incidentally noted in the right popliteal artery. Electronically Signed   By: Zetta Bills M.D.   On: 03/13/2019 19:03   Dg Chest Port 1 View  Result Date: 03/13/2019 CLINICAL DATA:  Renal failure.  Hypertension and smoking. EXAM: PORTABLE CHEST 1 VIEW COMPARISON:  None. FINDINGS: The heart size and mediastinal contours are within normal limits. Both lungs are clear. The visualized skeletal structures are unremarkable. IMPRESSION: No active disease. Electronically Signed   By: Constance Holster M.D.   On:  03/13/2019 20:36    EKG: Independently reviewed.      Assessment and Plan:   Active Problems:   Acute renal failure (ARF) ([place])   1.  AKI with urinary retention/bilateral hydronephrosis: Present on admission.  S/p Foley catheter placement with significant urinary output and downtrending creatinine on repeat labs this morning with hydration (BUN at 56 and creatinine down to 4.9).  Will repeat BMP this afternoon.  Continue IV hydration and monitor I's and O's.  Nephrology to evaluate and advise further.  He does have a history of diabetes and proteinuria on urine analysis in 2019 as well.  Renal ultrasonogram on 12/6 revealed bilateral hydronephrosis right greater than left as well as mild to moderate parenchymal atrophy/cortical thinning suggestive of CKD.  Will obtain CT abdomen/pelvis given microscopic hematuria.  2.   Lower extremity rash: Could be related to PVD/vasculitis as it appears to be purpuric, maculopapular and nonblanching..  Venous Doppler bilateral lower extremity negative for DVT but showed right-sided findings of-Atheromatous plaque throughout visualized arterial vessels with abundant eccentric soft plaque in the right popliteal artery.  Will obtain arterial Dopplers bilateral lower extremities as soon as possible.  Check inflammatory markers including ESR/C-reactive protein.  Not sure if epistaxis and renal dysfunction could be part of vasculitic syndrome like Wegener's or other conditions like amyloidosis. Check Hep C profile, ANA, pANCA  3.  Acute normocytic anemia: Could be related to anemia of chronic kidney disease/progressive diabetic nephropathy.  No signs of acute GI bleed but does report epistaxis for a week.  Will obtain iron studies/stool for occult blood.  Monitor hemoglobin in the setting of IV hydration/dilutional effect and transfuse as needed.  Labs today show further drop to 8.3 but likely dilutional effect.  Patient does have microscopic hematuria.  Renal ultrasonogram did not visualize bladder well.  Will obtain CT renal for nephrolithiasis/ureteral calculi/bladder evaluation as well as to rule out retroperitoneal hematoma.  4. Mild hyponatremia: Likely secondary to dehydration/AKI.  Appears to be slightly improved with IV hydration.  Continue to monitor with serial labs.  Other electrolytes okay, corrected calcium to albumin within normal limits  5.  Diabetes mellitus: Not been on any medications recently.  Previously prescribed Metformin.  Sliding scale insulin for now.  Check hemoglobin A1c.  Diabetes educator consult.  Will await renal recommendations in terms of proteinuria.  Not a candidate for ACE inhibitors as of now.  6.  Hypertension, uncontrolled: Not been taking medications at home.  Could be contributing to problem #1 along with diabetes.  Will add Norvasc which may help  with PAD as well (it appears that he was supposed to be on Norvasc 10 mg previously).  Check urine drug screen  7.  Epistaxis: Patient reports right nostril bleeding for the last week or so.  Last episode of bleeding was day before yesterday.  No active bleeding currently.  No obvious nasal polyps noted.  Could be related to uncontrolled hypertension versus vasculitic process.  Not on any antiplatelet agents and platelet count within normal limits.  Monitor for now.  8.  Tobacco/alcohol dependence: He states he does not need nicotine patch as of now, no signs of alcohol withdrawal.  On CIWA protocol.  Counseled patient regarding risks of progressive atherosclerosis, heart disease with fatal risks in the setting of smoking, alcohol, medication noncompliance with underlying hypertension and diabetes.  He verbalized understanding and appears motivated to quit.  Denied any drug abuse, will check urine drug screen    DVT prophylaxis: Hold  anticoagulants in concern for epistaxis and anemia.  Hold SCDs until PAD ruled out.  TED hose ordered for now  COVID screen: Negative  Code Status: Full code as confirmed with patient   .Health care proxy would be his wife  Patient/Family Communication: Discussed with patient and all questions answered to satisfaction.  Consults called: Nephrology contacted by [facility] Admission status : I certify that at the point of admission it is my clinical judgment that the patient will require inpatient hospital care spanning beyond 2 midnights from the point of admission due to high intensity of service and high frequency of surveillance required.Inpatient status is judged to be reasonable and necessary in order to provide the required intensity of service to ensure the patient's safety. The patient's presenting symptoms, physical exam findings, and initial radiographic and laboratory data in the context of their chronic comorbidities is felt to place them at high risk for further  clinical deterioration. The following factors support the patient status of inpatient : AKI requiring IV hydration/nephrology evaluation. Expected LOS: 3 to 4 days    Guilford Shi MD [facility] Pager [phone]  If 7PM-7AM, please contact night-coverage [email or web address] Password [facility]  03/14/2019, 5:42 PM

## 2019-03-14 NOTE — ED Notes (Signed)
Beverage and crackers provided.

## 2019-03-14 NOTE — ED Notes (Signed)
Ginger Ale provided to pt

## 2019-03-14 NOTE — Progress Notes (Signed)
New Admission Note:   Arrival Method: strecher Mental Orientation:  Alert and oriented  Telemetry: box 4 Assessment: Completed Skin: intact with red spots  IV: left AC  Pain:9/10 both legs  Tubes: urinary Cathetre Safety Measures: Safety Fall Prevention Plan has been given, discussed and signed Admission: Completed [facility] [place] Orientation: Patient has been orientated to the room, unit and staff.  Family: none   Orders have been reviewed and implemented. Will continue to monitor the patient. Call light has been placed within reach and bed alarm has been activated.   Wilena Tyndall RN [place] [facility] Phone: [phone]

## 2019-03-15 ENCOUNTER — Inpatient Hospital Stay (HOSPITAL_COMMUNITY): Payer: BC Managed Care – PPO

## 2019-03-15 DIAGNOSIS — L97909 Non-pressure chronic ulcer of unspecified part of unspecified lower leg with unspecified severity: Secondary | ICD-10-CM

## 2019-03-15 LAB — BASIC METABOLIC PANEL
Anion gap: 7 (ref 5–15)
BUN: 50 mg/dL — ABNORMAL HIGH (ref 8–23)
CO2: 18 mmol/L — ABNORMAL LOW (ref 22–32)
Calcium: 8.1 mg/dL — ABNORMAL LOW (ref 8.9–10.3)
Chloride: 107 mmol/L (ref 98–111)
Creatinine, Ser: 4.8 mg/dL — ABNORMAL HIGH (ref 0.61–1.24)
GFR calc Af Amer: 14 mL/min — ABNORMAL LOW (ref >60)
GFR calc non Af Amer: 12 mL/min — ABNORMAL LOW (ref >60)
Glucose, Bld: 129 mg/dL — ABNORMAL HIGH (ref 70–99)
Potassium: 4.5 mmol/L (ref 3.5–5.1)
Sodium: 132 mmol/L — ABNORMAL LOW (ref 135–145)

## 2019-03-15 LAB — CBC
HCT: 26.2 % — ABNORMAL LOW (ref 39.0–52.0)
Hemoglobin: 8.8 g/dL — ABNORMAL LOW (ref 13.0–17.0)
MCH: 33.3 pg (ref 26.0–34.0)
MCHC: 33.6 g/dL (ref 30.0–36.0)
MCV: 99.2 fL (ref 80.0–100.0)
Platelets: 184 10*3/uL (ref 150–400)
RBC: 2.64 MIL/uL — ABNORMAL LOW (ref 4.22–5.81)
RDW: 14.6 % (ref 11.5–15.5)
WBC: 7.4 10*3/uL (ref 4.0–10.5)
nRBC: 0 % (ref 0.0–0.2)

## 2019-03-15 LAB — GLUCOSE, CAPILLARY
Glucose-Capillary: 121 mg/dL — ABNORMAL HIGH (ref 70–99)
Glucose-Capillary: 129 mg/dL — ABNORMAL HIGH (ref 70–99)
Glucose-Capillary: 124 mg/dL — ABNORMAL HIGH (ref 70–99)
Glucose-Capillary: 133 mg/dL — ABNORMAL HIGH (ref 70–99)

## 2019-03-15 LAB — HEPATITIS B CORE ANTIBODY, IGM: Hep B C IgM: NONREACTIVE

## 2019-03-15 LAB — HIV ANTIBODY (ROUTINE TESTING W REFLEX): HIV Screen 4th Generation wRfx: NONREACTIVE

## 2019-03-15 LAB — HEPATITIS C ANTIBODY: HCV Ab: NONREACTIVE

## 2019-03-15 LAB — PROTIME-INR
INR: 1.2 (ref 0.8–1.2)
Prothrombin Time: 15.2 seconds (ref 11.4–15.2)

## 2019-03-15 LAB — HEPATITIS B SURFACE ANTIGEN: Hepatitis B Surface Ag: NONREACTIVE

## 2019-03-15 MED ORDER — SODIUM CHLORIDE 0.9 % IV SOLN
INTRAVENOUS | Status: DC
  Administered 2019-03-15 (×2): via INTRAVENOUS

## 2019-03-15 MED ORDER — FENTANYL CITRATE (PF) 100 MCG/2ML IJ SOLN
12.5000 ug | INTRAMUSCULAR | Status: DC | PRN
  Administered 2019-03-21 – 2019-03-27 (×7): 12.5 ug via INTRAVENOUS
  Filled 2019-03-15 (×7): qty 2

## 2019-03-15 NOTE — Consult Note (Addendum)
I have personally seen and examined this patient and agree with the assessment/plan as outlined below by Edwena Felty, [employer] student. 63 year old Caucasian man with past medical history significant for hypertension and type 2 diabetes mellitus that has been suboptimally treated over the past year or so due to his lack of follow-up with a primary care provider.  He presented to [facility] ER 2 days ago with hypertension and bilateral lower extremity rash for the past 3 to 4 weeks.  Upon initial evaluation in the emergency room, he was found to be in acute kidney injury and consequent work-up revealed distended bladder for which Foley catheter was placed.  This was corroborated with CT scan findings of mild bilateral hydronephrosis.  Also interesting on his exam is the presence of dipstick hematuria with microscopic RBCs as well as dipstick proteinuria.  His renal function is improved minimally overnight and concern is raised regarding GN with bilateral lower extremity nonblanching lacy skin rash/purpura.  He reports recent use of nonsteroidal anti-inflammatory drugs and has not been on any antihypertensive therapy/diuretics.  Recently history is also significant for treatment for urinary tract infection (details unclear per patient).  Based on the history, timeline of events and available database-this is unlikely to be ATN with the differentials for GN and AIN being entertained.  It is also likely that his underlying hypertension and diabetes may have led to progressive chronic kidney disease based on echogenicity seen on renal ultrasound along with cortical thinning seen on CT scan.  We will check an antinuclear antibody, ANCA, complement levels and anti-GBM.  I will also go ahead and check for plasma cell dyscrasia with SPEP and free light chains.  No acute dialysis indications, no acute electrolyte abnormalities.  He is euvolemic and without any uremic signs or symptoms.   Sanchez Hemmer K.,MD 03/15/2019  3:28 PM   Reason for Consult: Acute Kidney injury Referring Physician: Dr. Ilda Basset is an 63 y.o. male.  HPI: Ms. Hankerson has a past medical history of DM II, HTN who presented to [facility] emergency room on 12/6 hypertensive with bilateral foot pain and redness x 3 days and rash on lower extremities x 3-4 weeks. He was found to have abdominal tenderness and given a foley s/p bladder scan. He was also treated with rocephin for UTI demonstrated on urinalysis. He was transferred to [facility] at the recommendation of nephrology for his acute kidney injury noted on labs.   Per patient, a few months ago he was given unknown antibiotics for a symptomatic UTI including blood in urine, burning, frequency and urgency. He denies any of these symptoms prior to his admission. From admission, he endorses the rash on his bilateral legs which has darkened over time, pain in the back of his legs, and swelling. He drives a forklift and states that when he attempted to get up after sitting for a while that the pain was so severe he was not able to walk. He states he has sensitive areas on his lateral ankles, Rt> Lt that is not painful with rest, but 10/10 with touch. He takes 800mg  ibuprofen daily for pain, numbness and cramping in his bilateral feet. He states when he did take medication for his diabetes and hypertension, he took metformin and amlodipine, however he has not taken medication in about 2 years. He does note that he should cute back on his drinking, he states he drinks 3-4 beers on a work night and about three 15-packs of 12oz  cans of beer of the course of a weekend. He states he has smoked since around 63years old and currently smokes 1.5ppd. He states his dad died due to renal failure.   He denies any difficulty breathing, chest pain, headache, fevers, chills, nightsweats, fatigue, weight changes, nausea, vomiting, diarrhea. He denies any changes in stool or urine other than decreased. He endorses  his usual smokers cough and sinus drainage with cold weather.   Trend in Creatinine: Creatinine, Ser  Date/Time Value Ref Range Status  03/15/2019 05:55 AM 4.80 (H) 0.61 - 1.24 mg/dL Final  03/14/2019 06:24 PM 4.80 (H) 0.61 - 1.24 mg/dL Final  03/14/2019 10:52 AM 4.93 (H) 0.61 - 1.24 mg/dL Final  03/13/2019 05:45 PM 5.24 (H) 0.61 - 1.24 mg/dL Final  11/29/2017 12:33 AM 1.20 0.61 - 1.24 mg/dL Final    PMH:   Past Medical History:  Diagnosis Date  . ARF (acute renal failure) ([place]) 03/14/2019  . Cellulitis 03/14/2019   feet  . Diabetes mellitus without complication ([place])   . Hypertension   . Irregular heart beat   . UTI (urinary tract infection)     PSH:   Past Surgical History:  Procedure Laterality Date  . WISDOM TOOTH EXTRACTION      Allergies:  Allergies  Allergen Reactions  . Hydrocodone Hives and Itching  . Percocet [Oxycodone-Acetaminophen] Hives and Itching    Medications:   Prior to Admission medications   Medication Sig Start Date End Date Taking? Authorizing Provider  ibuprofen (ADVIL) 200 MG tablet Take 800 mg by mouth every 6 (six) hours as needed (pain).   Yes [provider]  amLODipine (NORVASC) 10 MG tablet Take 1 tablet (10 mg total) by mouth daily. Patient not taking: Reported on 03/14/2019 05/11/16   Kinnie Feil, PA-C  metFORMIN (GLUCOPHAGE) 500 MG tablet Take 1 tablet (500 mg total) by mouth 2 (two) times daily with a meal. Patient not taking: Reported on 03/14/2019 05/11/16   Kinnie Feil, PA-C    Inpatient medications: . amLODipine  2.5 mg Oral Daily  . Chlorhexidine Gluconate Cloth  6 each Topical Daily  . insulin aspart  0-5 Units Subcutaneous QHS  . insulin aspart  0-9 Units Subcutaneous TID WC  . LORazepam  0-4 mg Intravenous Q6H   Or  . LORazepam  0-4 mg Oral Q6H  . [START ON 03/16/2019] LORazepam  0-4 mg Intravenous Q12H   Or  . [START ON 03/16/2019] LORazepam  0-4 mg Oral Q12H  . thiamine  100 mg Oral Daily   Or  .  thiamine  100 mg Intravenous Daily    Discontinued Meds:   Medications Discontinued During This Encounter  Medication Reason  . cephALEXin (KEFLEX) 500 MG capsule Completed Course  . amLODipine (NORVASC) tablet 5 mg     Social History:  reports that he has been smoking cigarettes. He has never used smokeless tobacco. He reports current alcohol use. He reports that he does not use drugs.  Family History:   Family History  Problem Relation Age of Onset  . Diabetes type II Mother   . Lung cancer Mother   . Hypertension Mother   . Diabetes type II Father   . Diabetes type II Sister   . Diabetes type II Brother   . Hypertension Brother   . CAD Neg Hx      Weight change:   Intake/Output Summary (Last 24 hours) at 03/15/2019 1244 Last data filed at 03/15/2019 1100 Gross per 24 hour  Intake 840 ml  Output 1400 ml  Net -560 ml   BP 134/68 (BP Location: Right Arm)   Pulse 75   Temp 99 F (37.2 C) (Oral)   Resp 18   Ht 6\' 2"  (1.88 m)   Wt 92.5 kg   SpO2 100%   BMI 26.19 kg/m  Vitals:   03/14/19 1705 03/14/19 2139 03/15/19 0525 03/15/19 0933  BP: (!) 162/88 (!) 171/68 135/71 134/68  Pulse: 85 63 76 75  Resp: 16 16 16 18   Temp: 98.6 F (37 C) 97.8 F (36.6 C) 98.6 F (37 C) 99 F (37.2 C)  TempSrc: Oral Oral Oral Oral  SpO2: 99% 100% 100% 100%  Weight:      Height:         Physical Exam  Constitutional: No distress.  Cardiovascular: Normal rate, regular rhythm and normal heart sounds.  Respiratory: Effort normal.  Decreased breath sounds  GI: Soft. Bowel sounds are normal. He exhibits no distension. There is no abdominal tenderness.  Musculoskeletal:     Comments: Edema on Right lateral malleolus  Skin: Skin is warm and dry.  Non-blanching purpura like rash on lower legs bilaterally  Psychiatric: He has a normal mood and affect.    Labs: Basic Metabolic Panel: Recent Labs  Lab 03/13/19 1745 03/14/19 1052 03/14/19 1824 03/15/19 0555  NA 131* 132* 130*  132*  K 4.6 4.5 4.9 4.5  CL 104 106 106 107  CO2 17* 18* 18* 18*  GLUCOSE 144* 214* 152* 129*  BUN 53* 56* 51* 50*  CREATININE 5.24* 4.93* 4.80* 4.80*  ALBUMIN 3.0*  --   --   --   CALCIUM 8.4* 7.8* 8.1* 8.1*   Liver Function Tests: Recent Labs  Lab 03/13/19 1745  AST 20  ALT 13  ALKPHOS 98  BILITOT 0.8  PROT 7.9  ALBUMIN 3.0*   CBC: Recent Labs  Lab 03/13/19 1745 03/14/19 1052 03/15/19 0555  WBC 8.3 7.5 7.4  NEUTROABS 6.8 5.5  --   HGB 9.7* 8.3* 8.8*  HCT 29.1* 25.5* 26.2*  MCV 98.6 100.8* 99.2  PLT 220 180 184   CBG: Recent Labs  Lab 03/14/19 1541 03/14/19 1701 03/14/19 2141 03/15/19 0659 03/15/19 1143  GLUCAP 111* 112* 96 121* 129*    Iron Studies:  Recent Labs  Lab 03/14/19 1824  IRON 63  TIBC 165*  FERRITIN 283    Xrays/Other Studies: Ct Abdomen Pelvis Wo Contrast  Result Date: 03/14/2019 CLINICAL DATA:  Renal failure.  Anemia.  Microscopic hematuria. EXAM: CT ABDOMEN AND PELVIS WITHOUT CONTRAST TECHNIQUE: Multidetector CT imaging of the abdomen and pelvis was performed following the standard protocol without IV contrast. COMPARISON:  03/13/2019 renal ultrasound.  No prior CT. FINDINGS: Lower chest: Emphysema. Normal heart size without pericardial or pleural effusion. Multivessel coronary artery atherosclerosis. Hepatobiliary: Normal noncontrast appearance of the liver. Small gallstones without acute cholecystitis or biliary duct dilatation. Pancreas: Normal, without mass or ductal dilatation. Spleen: Normal in size, without focal abnormality. Adrenals/Urinary Tract: Normal adrenal glands. Mild renal cortical thinning bilaterally. Perirenal interstitial thickening. Exophytic interpolar right renal 3.8 cm lesion measures 48 HU on 31/3. No renal calculi. Mild bilateral hydroureteronephrosis. Hydroureter is followed to the level of the urinary bladder, without obstructive mass. There is a nonobstructive punctate mid right ureteric stone on 59/3 and sagittal  image 85. Foley catheter within the urinary bladder. The bladder is thick walled with mild surrounding edema. Stomach/Bowel: Normal stomach, without wall thickening. Normal colon, appendix, and terminal  ileum. Normal small bowel. Vascular/Lymphatic: Advanced aortic and branch vessel atherosclerosis. Mildly prominent abdominal retroperitoneal nodes are not pathologic by size criteria and likely reactive. Prominent bilateral inguinal nodes are also identified and favored to be reactive. Reproductive: Normal prostate. Other: No significant free fluid. Musculoskeletal: Lumbosacral spondylosis. Minimal wedging of the T11 superior endplate. IMPRESSION: 1. Bilateral mild hydroureteronephrosis, followed to the level of the urinary bladder. The bladder is thick walled with surrounding pericystic edema in the setting of a Foley catheter. Considerations include cystitis and/or bladder outlet obstruction. Perirenal interstitial thickening is nonspecific but likely related to the clinical history of renal insufficiency. 2. 3 mm mid right ureteric nonobstructive stone. 3. Right renal mass is technically indeterminate on noncontrast exam. Most consistent with a cyst on yesterday's ultrasound. Consider surveillance with renal ultrasound at 6 months and attention to the right renal lesion. 4. Coronary artery atherosclerosis. Aortic Atherosclerosis (ICD10-I70.0). Emphysema (ICD10-J43.9). 5. Cholelithiasis. Electronically Signed   By: Abigail Miyamoto M.D.   On: 03/14/2019 19:44   US Renal  Result Date: 03/13/2019 CLINICAL DATA:  63 year old male with acute renal failure. History of diabetes. EXAM: RENAL / URINARY TRACT ULTRASOUND COMPLETE COMPARISON:  None. FINDINGS: Evaluation is limited as the patient was not able to cooperate with exam and due to portable technique. Right Kidney: Renal measurements: 12.6 x 4.4 x 4.1 cm = volume: 117 mL. There is moderate parenchyma atrophy. There is increased renal parenchymal echogenicity. There  is a 3.3 x 2.4 x 3.1 cm upper pole cyst. There is moderate right hydronephrosis. No shadowing stone. Left Kidney: Renal measurements: 12.8 x 4.8 x 5.1 cm = volume: 164 ML. There is mild parenchyma atrophy and cortical thinning. There is increased renal parenchymal echogenicity. Mild-to-moderate hydronephrosis. No shadowing stone. Bladder: Not well visualized. Other: None. IMPRESSION: 1. Increased renal parenchyma echogenicity in keeping with chronic kidney disease. 2. Bilateral hydronephrosis, right greater left. No shadowing stone. Electronically Signed   By: Anner Crete M.D.   On: 03/13/2019 20:15   US Venous Img Lower Bilateral  Result Date: 03/13/2019 CLINICAL DATA:  Leg swelling right greater than left, calf pain and bilateral foot pain EXAM: BILATERAL LOWER EXTREMITY VENOUS DOPPLER ULTRASOUND TECHNIQUE: Gray-scale sonography with graded compression, as well as color Doppler and duplex ultrasound were performed to evaluate the lower extremity deep venous systems from the level of the common femoral vein and including the common femoral, femoral, profunda femoral, popliteal and calf veins including the posterior tibial, peroneal and gastrocnemius veins when visible. The superficial great saphenous vein was also interrogated. Spectral Doppler was utilized to evaluate flow at rest and with distal augmentation maneuvers in the common femoral, femoral and popliteal veins. COMPARISON:  None FINDINGS: RIGHT LOWER EXTREMITY Common Femoral Vein: No evidence of thrombus. Normal compressibility, respiratory phasicity and response to augmentation. Saphenofemoral Junction: No evidence of thrombus. Normal compressibility and flow on color Doppler imaging. Profunda Femoral Vein: No evidence of thrombus. Normal compressibility and flow on color Doppler imaging. Femoral Vein: No evidence of thrombus. Normal compressibility, respiratory phasicity and response to augmentation. Popliteal Vein: No evidence of thrombus.  Normal compressibility, respiratory phasicity and response to augmentation. Calf Veins: No evidence of thrombus. Normal compressibility and flow on color Doppler imaging. Superficial Great Saphenous Vein: No evidence of thrombus. Normal compressibility. Venous Reflux:  None. Other Findings:  None. LEFT LOWER EXTREMITY Common Femoral Vein: No evidence of thrombus. Normal compressibility, respiratory phasicity and response to augmentation. Saphenofemoral Junction: No evidence of thrombus. Normal compressibility and flow on color  Doppler imaging. Profunda Femoral Vein: No evidence of thrombus. Normal compressibility and flow on color Doppler imaging. Femoral Vein: No evidence of thrombus. Normal compressibility, respiratory phasicity and response to augmentation. Popliteal Vein: No evidence of thrombus. Normal compressibility, respiratory phasicity and response to augmentation. Calf Veins: No evidence of thrombus. Normal compressibility and flow on color Doppler imaging. Superficial Great Saphenous Vein: No evidence of thrombus. Normal compressibility. Venous Reflux:  None. Other Findings: Atheromatous plaque throughout visualized arterial vessels with abundant eccentric soft plaque in the right popliteal artery. IMPRESSION: No evidence of deep venous thrombosis in either lower extremity. Signs of arterial vascular plaque with more pronounced changes incidentally noted in the right popliteal artery. Electronically Signed   By: Zetta Bills M.D.   On: 03/13/2019 19:03   Dg Chest Port 1 View  Result Date: 03/13/2019 CLINICAL DATA:  Renal failure.  Hypertension and smoking. EXAM: PORTABLE CHEST 1 VIEW COMPARISON:  None. FINDINGS: The heart size and mediastinal contours are within normal limits. Both lungs are clear. The visualized skeletal structures are unremarkable. IMPRESSION: No active disease. Electronically Signed   By: Constance Holster M.D.   On: 03/13/2019 20:36     Assessment/Plan: 1.  Acute Kidney  Injury. GFR of 12. Unknown baseline creatinine but 11/2017 labs show normal GFR and creatinine of 1.20. 12/6 Renal US reveals Rt > Lt bilateral renal parenchyma atrophy and hydronephrosis as well as a possible cyst on right kidney. 12/7 CT revealed thinning of cortex and thickening of bladder but no cause for obstruction. Some improvement since foley placed on admission including BUN from 53 to 50 and Creatinine 5.24 to 4.8. Urinalysis showed protein, hemoglobin and eosinophils. He was completed a course of rocephin for leukocytes and bacteria on urinalysis. Purpuric, lacy, non-blanching rash concerning for glomerulonephritis. No HIV or Hep C associated glomerulonephritis.  -     Renal work up including urine sodium and creatinine, serum ANCA, ANA, Anti-dsDNA, C3, C4  If no improvement in next few days, he will need a renal biopsy for diagnosis and prognosis.  2. Hyponatremia. Likely secondary to his impaired kidney function, 11/2017 labwork showed normal sodium. 3.   Non anion gap metabolic acidosis. Likely secondary to his impaired kidney function. Bicarb has improved from 17 on presentation to 18. 4.   Normocytic anemia. 8.8 today -     Iron studies 5.   Hypertension. Intermittently elevated over the past 24hrs. Began amlodipine 2.5mg  yesterday 6.   Diabetes. States he took metformin at one time. A1C 6.2   [employer] Student 03/15/2019, 12:44 PM

## 2019-03-15 NOTE — Progress Notes (Signed)
PROGRESS NOTE    Louis Butler  JZP:[id number] DOB: 07-29-1955 DOA: 03/13/2019 PCP: Patient, [name]     Brief Narrative:  Louis Butler is a 63 y.o. male with history h/o diabetes mellitus, hypertension, ?arrhythmia, who has not seen a PCP for at least 2 years and not on any medications at home presented to [facility] due to concern for lower extremity rash that has been ongoing and worsening over last 3 to 4 weeks. Work up revealed new anemia, AKI. Patient noted to have urinary retention and Foley catheter placed with 1000 mL urine output. Due to concern for vasculitis component of patient's presentation, Nephrology was consulted.   New events last 24 hours / Subjective: Admits to pain in his bilateral feet, worsen on right. States that this has been ongoing for about 3-4 weeks, coinciding with the beginning of the rash.  No complaints of fever, chest pain, shortness of breath, nausea, vomiting, diarrhea or abdominal pain.  Assessment & Plan:   Principal Problem:   Acute renal failure (ARF) (HCC) Active Problems:   DM type 2, uncontrolled, with lower extremity ulcer ([place])   Hypertension   Non compliance w medication regimen   Tobacco use   Rash of both feet   Hyponatremia   Epistaxis    AKI with urinary retention and bilateral hydronephrosis -Baseline creatinine in August 2019 was 1.2 -Presented with creatinine 5.24 --> improved to 4.8 this morning -Renal ultrasound revealed bilateral hydronephrosis R>L -Foley catheter placed with 1 L urine output -CT abdomen pelvis revealed bilateral mild hydroureteronephrosis -IVF -Trend BMP -Nephrology consulted   Purpuric rash of the bilateral lower extremities -Venous Doppler negative for DVT -ESR and CRP remains elevated -Concern for vasculitic syndrome, patient also had complaints of epistaxis prior to admission. Hematuria present -Hepatitis panel pending -HIV negative  Normocytic anemia -Patient declined  FOBT -Iron studies consistent with anemia of chronic disease  Diabetes mellitus -Hemoglobin A1c 6.2 -Well-controlled -SSI   Hypertension -Patient has not been on any medication as an outpatient, no PCP follow-up in 2 years -Norvasc started at time of admission   Alcohol dependence -Per report, drinks 12-18 beers daily  -CIWA scale  Incidental right renal mass -Most consistent with a cyst on yesterday's ultrasound. Consider surveillance with renal ultrasound at 6 months and attention to the right renal lesion   DVT prophylaxis: SCD  Code Status: Full code Family Communication: None at bedside Disposition Plan: Pending further work-up and improvement   Consultants:   Nephrology  Procedures:   None  Antimicrobials:  Anti-infectives (From admission, onward)   Start     Dose/Rate Route Frequency Ordered Stop   03/14/19 1900  cefTRIAXone (ROCEPHIN) 1 g in sodium chloride 0.9 % 100 mL IVPB     1 g 200 mL/hr over 30 Minutes Intravenous  Once 03/14/19 1001 03/14/19 2235   03/13/19 1945  cefTRIAXone (ROCEPHIN) 1 g in sodium chloride 0.9 % 100 mL IVPB     1 g 200 mL/hr over 30 Minutes Intravenous  Once 03/13/19 1932 03/13/19 2016        Objective: Vitals:   03/14/19 1705 03/14/19 2139 03/15/19 0525 03/15/19 0933  BP: (!) 162/88 (!) 171/68 135/71 134/68  Pulse: 85 63 76 75  Resp: _0 Temp: 98.6 F (37 C) 97.8 F (36.6 C) 98.6 F (37 C) 99 F (37.2 C)  TempSrc: Oral Oral Oral Oral  SpO2: 99% 100% 100% 100%  Weight:  Height:        Intake/Output Summary (Last 24 hours) at 03/15/2019 1039 Last data filed at 03/15/2019 0601 Gross per 24 hour  Intake 480 ml  Output 1400 ml  Net -920 ml   Filed Weights   03/13/19 1657  Weight: 92.5 kg    Examination:  General exam: Appears calm and comfortable  Respiratory system: Clear to auscultation. Respiratory effort normal. No respiratory distress. No conversational dyspnea. On room air  Cardiovascular  system: S1 & S2 heard, RRR. No murmurs. No pedal edema. Gastrointestinal system: Abdomen is nondistended, soft and nontender. Normal bowel sounds heard. Central nervous system: Alert and oriented. No focal neurological deficits. Speech clear.  Extremities: Symmetric in appearance  Skin: +purpuric rash of bilateral LE over ankles and feet, worse on right Psychiatry: Judgement and insight appear normal. Mood & affect appropriate.   Data Reviewed: I have personally reviewed following labs and imaging studies  CBC: Recent Labs  Lab 03/13/19 1745 03/14/19 1052 03/15/19 0555  WBC 8.3 7.5 7.4  NEUTROABS 6.8 5.5  --   HGB 9.7* 8.3* 8.8*  HCT 29.1* 25.5* 26.2*  MCV 98.6 100.8* 99.2  PLT 220 180 161   Basic Metabolic Panel: Recent Labs  Lab 03/13/19 1745 03/14/19 1052 03/14/19 1824 03/15/19 0555  NA 131* 132* 130* 132*  K 4.6 4.5 4.9 4.5  CL 104 106 106 107  CO2 17* 18* 18* 18*  GLUCOSE 144* 214* 152* 129*  BUN 53* 56* 51* 50*  CREATININE 5.24* 4.93* 4.80* 4.80*  CALCIUM 8.4* 7.8* 8.1* 8.1*   GFR: Estimated Creatinine Clearance: 18.3 mL/min (A) (by C-G formula based on SCr of 4.8 mg/dL (H)). Liver Function Tests: Recent Labs  Lab 03/13/19 1745  AST 20  ALT 13  ALKPHOS 98  BILITOT 0.8  PROT 7.9  ALBUMIN 3.0*   No results for input(s): LIPASE, AMYLASE in the last 168 hours. No results for input(s): AMMONIA in the last 168 hours. Coagulation Profile: Recent Labs  Lab 03/13/19 1745 03/15/19 0555  INR 1.1 1.2   Cardiac Enzymes: No results for input(s): CKTOTAL, CKMB, CKMBINDEX, TROPONINI in the last 168 hours. BNP (last 3 results) No results for input(s): PROBNP in the last 8760 hours. HbA1C: Recent Labs    03/14/19 1824  HGBA1C 6.2*   CBG: Recent Labs  Lab 03/14/19 1541 03/14/19 1701 03/14/19 2141 03/15/19 0659  GLUCAP 111* 112* 96 121*   Lipid Profile: No results for input(s): CHOL, HDL, LDLCALC, TRIG, CHOLHDL, LDLDIRECT in the last 72  hours. Thyroid Function Tests: No results for input(s): TSH, T4TOTAL, FREET4, T3FREE, THYROIDAB in the last 72 hours. Anemia Panel: Recent Labs    03/14/19 1824  FERRITIN 283  TIBC 165*  IRON 63   Sepsis Labs: No results for input(s): PROCALCITON, LATICACIDVEN in the last 168 hours.  Recent Results (from the past 240 hour(s))  SARS CORONAVIRUS 2 (TAT 6-24 HRS) Nasopharyngeal Nasopharyngeal Swab     Status: None   Collection Time: 03/13/19  7:11 PM   Specimen: Nasopharyngeal Swab  Result Value Ref Range Status   SARS Coronavirus 2 NEGATIVE NEGATIVE Final    Comment: (NOTE) SARS-CoV-2 target nucleic acids are NOT DETECTED. The SARS-CoV-2 RNA is generally detectable in upper and lower respiratory specimens during the acute phase of infection. Negative results do not preclude SARS-CoV-2 infection, do not rule out co-infections with other pathogens, and should not be used as the sole basis for treatment or other patient management decisions. Negative results must be  combined with clinical observations, patient history, and epidemiological information. The expected result is Negative. Fact Sheet for Patients: SugarRoll.be Fact Sheet for Healthcare Providers: https://www.woods-mathews.com/ This test is not yet approved or cleared by the [place] FDA and  has been authorized for detection and/or diagnosis of SARS-CoV-2 by FDA under an Emergency Use Authorization (EUA). This EUA will remain  in effect (meaning this test can be used) for the duration of the COVID-19 declaration under Section 56 4(b)(1) of the Act, 21 U.S.C. section 360bbb-3(b)(1), unless the authorization is terminated or revoked sooner. Performed at [facility], [place] [address] [place], [place] [address]       Radiology Studies: Ct Abdomen Pelvis Wo Contrast  Result Date: 03/14/2019 CLINICAL DATA:  Renal failure.  Anemia.  Microscopic hematuria. EXAM: CT  ABDOMEN AND PELVIS WITHOUT CONTRAST TECHNIQUE: Multidetector CT imaging of the abdomen and pelvis was performed following the standard protocol without IV contrast. COMPARISON:  03/13/2019 renal ultrasound.  No prior CT. FINDINGS: Lower chest: Emphysema. Normal heart size without pericardial or pleural effusion. Multivessel coronary artery atherosclerosis. Hepatobiliary: Normal noncontrast appearance of the liver. Small gallstones without acute cholecystitis or biliary duct dilatation. Pancreas: Normal, without mass or ductal dilatation. Spleen: Normal in size, without focal abnormality. Adrenals/Urinary Tract: Normal adrenal glands. Mild renal cortical thinning bilaterally. Perirenal interstitial thickening. Exophytic interpolar right renal 3.8 cm lesion measures 48 HU on 31/3. No renal calculi. Mild bilateral hydroureteronephrosis. Hydroureter is followed to the level of the urinary bladder, without obstructive mass. There is a nonobstructive punctate mid right ureteric stone on 59/3 and sagittal image 85. Foley catheter within the urinary bladder. The bladder is thick walled with mild surrounding edema. Stomach/Bowel: Normal stomach, without wall thickening. Normal colon, appendix, and terminal ileum. Normal small bowel. Vascular/Lymphatic: Advanced aortic and branch vessel atherosclerosis. Mildly prominent abdominal retroperitoneal nodes are not pathologic by size criteria and likely reactive. Prominent bilateral inguinal nodes are also identified and favored to be reactive. Reproductive: Normal prostate. Other: No significant free fluid. Musculoskeletal: Lumbosacral spondylosis. Minimal wedging of the T11 superior endplate. IMPRESSION: 1. Bilateral mild hydroureteronephrosis, followed to the level of the urinary bladder. The bladder is thick walled with surrounding pericystic edema in the setting of a Foley catheter. Considerations include cystitis and/or bladder outlet obstruction. Perirenal interstitial  thickening is nonspecific but likely related to the clinical history of renal insufficiency. 2. 3 mm mid right ureteric nonobstructive stone. 3. Right renal mass is technically indeterminate on noncontrast exam. Most consistent with a cyst on yesterday's ultrasound. Consider surveillance with renal ultrasound at 6 months and attention to the right renal lesion. 4. Coronary artery atherosclerosis. Aortic Atherosclerosis (ICD10-I70.0). Emphysema (ICD10-J43.9). 5. Cholelithiasis. Electronically Signed   By: Abigail Miyamoto M.D.   On: 03/14/2019 19:44   US Renal  Result Date: 03/13/2019 CLINICAL DATA:  63 year old male with acute renal failure. History of diabetes. EXAM: RENAL / URINARY TRACT ULTRASOUND COMPLETE COMPARISON:  None. FINDINGS: Evaluation is limited as the patient was not able to cooperate with exam and due to portable technique. Right Kidney: Renal measurements: 12.6 x 4.4 x 4.1 cm = volume: 117 mL. There is moderate parenchyma atrophy. There is increased renal parenchymal echogenicity. There is a 3.3 x 2.4 x 3.1 cm upper pole cyst. There is moderate right hydronephrosis. No shadowing stone. Left Kidney: Renal measurements: 12.8 x 4.8 x 5.1 cm = volume: 164 ML. There is mild parenchyma atrophy and cortical thinning. There is increased renal parenchymal echogenicity. Mild-to-moderate hydronephrosis. No  shadowing stone. Bladder: Not well visualized. Other: None. IMPRESSION: 1. Increased renal parenchyma echogenicity in keeping with chronic kidney disease. 2. Bilateral hydronephrosis, right greater left. No shadowing stone. Electronically Signed   By: Anner Crete M.D.   On: 03/13/2019 20:15   US Venous Img Lower Bilateral  Result Date: 03/13/2019 CLINICAL DATA:  Leg swelling right greater than left, calf pain and bilateral foot pain EXAM: BILATERAL LOWER EXTREMITY VENOUS DOPPLER ULTRASOUND TECHNIQUE: Gray-scale sonography with graded compression, as well as color Doppler and duplex ultrasound were  performed to evaluate the lower extremity deep venous systems from the level of the common femoral vein and including the common femoral, femoral, profunda femoral, popliteal and calf veins including the posterior tibial, peroneal and gastrocnemius veins when visible. The superficial great saphenous vein was also interrogated. Spectral Doppler was utilized to evaluate flow at rest and with distal augmentation maneuvers in the common femoral, femoral and popliteal veins. COMPARISON:  None FINDINGS: RIGHT LOWER EXTREMITY Common Femoral Vein: No evidence of thrombus. Normal compressibility, respiratory phasicity and response to augmentation. Saphenofemoral Junction: No evidence of thrombus. Normal compressibility and flow on color Doppler imaging. Profunda Femoral Vein: No evidence of thrombus. Normal compressibility and flow on color Doppler imaging. Femoral Vein: No evidence of thrombus. Normal compressibility, respiratory phasicity and response to augmentation. Popliteal Vein: No evidence of thrombus. Normal compressibility, respiratory phasicity and response to augmentation. Calf Veins: No evidence of thrombus. Normal compressibility and flow on color Doppler imaging. Superficial Great Saphenous Vein: No evidence of thrombus. Normal compressibility. Venous Reflux:  None. Other Findings:  None. LEFT LOWER EXTREMITY Common Femoral Vein: No evidence of thrombus. Normal compressibility, respiratory phasicity and response to augmentation. Saphenofemoral Junction: No evidence of thrombus. Normal compressibility and flow on color Doppler imaging. Profunda Femoral Vein: No evidence of thrombus. Normal compressibility and flow on color Doppler imaging. Femoral Vein: No evidence of thrombus. Normal compressibility, respiratory phasicity and response to augmentation. Popliteal Vein: No evidence of thrombus. Normal compressibility, respiratory phasicity and response to augmentation. Calf Veins: No evidence of thrombus. Normal  compressibility and flow on color Doppler imaging. Superficial Great Saphenous Vein: No evidence of thrombus. Normal compressibility. Venous Reflux:  None. Other Findings: Atheromatous plaque throughout visualized arterial vessels with abundant eccentric soft plaque in the right popliteal artery. IMPRESSION: No evidence of deep venous thrombosis in either lower extremity. Signs of arterial vascular plaque with more pronounced changes incidentally noted in the right popliteal artery. Electronically Signed   By: Zetta Bills M.D.   On: 03/13/2019 19:03   Dg Chest Port 1 View  Result Date: 03/13/2019 CLINICAL DATA:  Renal failure.  Hypertension and smoking. EXAM: PORTABLE CHEST 1 VIEW COMPARISON:  None. FINDINGS: The heart size and mediastinal contours are within normal limits. Both lungs are clear. The visualized skeletal structures are unremarkable. IMPRESSION: No active disease. Electronically Signed   By: Constance Holster M.D.   On: 03/13/2019 20:36      Scheduled Meds:  amLODipine  2.5 mg Oral Daily   Chlorhexidine Gluconate Cloth  6 each Topical Daily   insulin aspart  0-5 Units Subcutaneous QHS   insulin aspart  0-9 Units Subcutaneous TID WC   LORazepam  0-4 mg Intravenous Q6H   Or   LORazepam  0-4 mg Oral Q6H   [START ON 03/16/2019] LORazepam  0-4 mg Intravenous Q12H   Or   [START ON 03/16/2019] LORazepam  0-4 mg Oral Q12H   thiamine  100 mg Oral Daily   Or  thiamine  100 mg Intravenous Daily   Continuous Infusions:  sodium chloride 100 mL/hr at 03/15/19 1013     LOS: 2 days      Time spent: 40 minutes   Dessa Phi, DO [facility] 03/15/2019, 10:39 AM   Available via Epic secure chat 7am-7pm After these hours, please refer to coverage provider listed on [email or web address]

## 2019-03-15 NOTE — Progress Notes (Signed)
Ankle brachial indices complete.  Please see CV Proc tab for preliminary results. Lita Mains- RDMS, RVT 3:50 PM  03/15/2019

## 2019-03-15 NOTE — Plan of Care (Signed)
  Problem: Activity: Goal: Risk for activity intolerance will decrease Outcome: Progressing   

## 2019-03-16 DIAGNOSIS — E872 Acidosis, unspecified: Secondary | ICD-10-CM | POA: Diagnosis present

## 2019-03-16 DIAGNOSIS — E1129 Type 2 diabetes mellitus with other diabetic kidney complication: Secondary | ICD-10-CM

## 2019-03-16 DIAGNOSIS — D692 Other nonthrombocytopenic purpura: Secondary | ICD-10-CM | POA: Diagnosis present

## 2019-03-16 LAB — CBC
HCT: 24.3 % — ABNORMAL LOW (ref 39.0–52.0)
Hemoglobin: 8.1 g/dL — ABNORMAL LOW (ref 13.0–17.0)
MCH: 33.6 pg (ref 26.0–34.0)
MCHC: 33.3 g/dL (ref 30.0–36.0)
MCV: 100.8 fL — ABNORMAL HIGH (ref 80.0–100.0)
Platelets: 159 K/uL (ref 150–400)
RBC: 2.41 MIL/uL — ABNORMAL LOW (ref 4.22–5.81)
RDW: 14.6 % (ref 11.5–15.5)
WBC: 7.4 K/uL (ref 4.0–10.5)
nRBC: 0 % (ref 0.0–0.2)

## 2019-03-16 LAB — EXTRACTABLE NUCLEAR ANTIGEN ANTIBODY
ENA SM Ab Ser-aCnc: <0.2 AI (ref 0.0–0.9)
Ribonucleic Protein: 0.6 AI (ref 0.0–0.9)
SSA (Ro) (ENA) Antibody, IgG: <0.2 AI (ref 0.0–0.9)
SSB (La) (ENA) Antibody, IgG: <0.2 AI (ref 0.0–0.9)
Scleroderma (Scl-70) (ENA) Antibody, IgG: <0.2 AI (ref 0.0–0.9)
ds DNA Ab: 2 IU/mL (ref 0–9)

## 2019-03-16 LAB — BASIC METABOLIC PANEL
Anion gap: 7 (ref 5–15)
BUN: 48 mg/dL — ABNORMAL HIGH (ref 8–23)
CO2: 17 mmol/L — ABNORMAL LOW (ref 22–32)
Calcium: 7.6 mg/dL — ABNORMAL LOW (ref 8.9–10.3)
Chloride: 108 mmol/L (ref 98–111)
Creatinine, Ser: 4.29 mg/dL — ABNORMAL HIGH (ref 0.61–1.24)
GFR calc Af Amer: 16 mL/min — ABNORMAL LOW (ref >60)
GFR calc non Af Amer: 14 mL/min — ABNORMAL LOW (ref >60)
Glucose, Bld: 104 mg/dL — ABNORMAL HIGH (ref 70–99)
Potassium: 4.5 mmol/L (ref 3.5–5.1)
Sodium: 132 mmol/L — ABNORMAL LOW (ref 135–145)

## 2019-03-16 LAB — PROTEIN ELECTROPHORESIS, SERUM
A/G Ratio: 0.8 (ref 0.7–1.7)
Albumin ELP: 2.7 g/dL — ABNORMAL LOW (ref 2.9–4.4)
Alpha-1-Globulin: 0.3 g/dL (ref 0.0–0.4)
Alpha-2-Globulin: 0.6 g/dL (ref 0.4–1.0)
Beta Globulin: 0.9 g/dL (ref 0.7–1.3)
Gamma Globulin: 1.9 g/dL — ABNORMAL HIGH (ref 0.4–1.8)
Globulin, Total: 3.6 g/dL (ref 2.2–3.9)
Total Protein ELP: 6.3 g/dL (ref 6.0–8.5)

## 2019-03-16 LAB — C4 COMPLEMENT: Complement C4, Body Fluid: 19 mg/dL (ref 12–38)

## 2019-03-16 LAB — MPO/PR-3 (ANCA) ANTIBODIES
ANCA Proteinase 3: <3.5 U/mL (ref 0.0–3.5)
Myeloperoxidase Abs: <9 U/mL (ref 0.0–9.0)

## 2019-03-16 LAB — GLOMERULAR BASEMENT MEMBRANE ANTIBODIES: GBM Ab: 7 U (ref 0–20)

## 2019-03-16 LAB — GLUCOSE, CAPILLARY
Glucose-Capillary: 87 mg/dL (ref 70–99)
Glucose-Capillary: 121 mg/dL — ABNORMAL HIGH (ref 70–99)
Glucose-Capillary: 180 mg/dL — ABNORMAL HIGH (ref 70–99)
Glucose-Capillary: 130 mg/dL — ABNORMAL HIGH (ref 70–99)

## 2019-03-16 LAB — ANTINUCLEAR ANTIBODIES, IFA: ANA Ab, IFA: NEGATIVE

## 2019-03-16 LAB — KAPPA/LAMBDA LIGHT CHAINS
Kappa free light chain: 420 mg/L — ABNORMAL HIGH (ref 3.3–19.4)
Kappa, lambda light chain ratio: 0.82 (ref 0.26–1.65)
Lambda free light chains: 509.7 mg/L — ABNORMAL HIGH (ref 5.7–26.3)

## 2019-03-16 LAB — C3 COMPLEMENT: C3 Complement: 78 mg/dL — ABNORMAL LOW (ref 82–167)

## 2019-03-16 MED ORDER — TAMSULOSIN HCL 0.4 MG PO CAPS
0.4000 mg | ORAL_CAPSULE | Freq: Every day | ORAL | Status: DC
  Administered 2019-03-16 – 2019-03-23 (×8): 0.4 mg via ORAL
  Filled 2019-03-16 (×9): qty 1

## 2019-03-16 MED ORDER — ACETAMINOPHEN 325 MG PO TABS
650.0000 mg | ORAL_TABLET | Freq: Four times a day (QID) | ORAL | Status: DC | PRN
  Administered 2019-03-19 – 2019-03-27 (×3): 650 mg via ORAL
  Filled 2019-03-16 (×3): qty 2

## 2019-03-16 MED ORDER — TRAMADOL HCL 50 MG PO TABS
50.0000 mg | ORAL_TABLET | Freq: Four times a day (QID) | ORAL | Status: DC | PRN
  Administered 2019-03-16 – 2019-03-20 (×3): 50 mg via ORAL
  Filled 2019-03-16 (×3): qty 1

## 2019-03-16 MED ORDER — SODIUM BICARBONATE 650 MG PO TABS
650.0000 mg | ORAL_TABLET | Freq: Two times a day (BID) | ORAL | Status: DC
  Administered 2019-03-16 – 2019-03-23 (×15): 650 mg via ORAL
  Filled 2019-03-16 (×15): qty 1

## 2019-03-16 NOTE — Plan of Care (Signed)
  Problem: Activity: Goal: Risk for activity intolerance will decrease Outcome: Progressing   Problem: Nutrition: Goal: Adequate nutrition will be maintained Outcome: Progressing   

## 2019-03-16 NOTE — Progress Notes (Signed)
[facility]  PROGRESS NOTE  Louis Butler JEH:[id number] DOB: 08/03/1955 DOA: 03/13/2019 PCP: Patient, [name] Admit date - 03/13/2019   Admitting Physician Shela Leff, MD  Outpatient Primary MD for the patient is Patient, [name]  LOS - 3 Brief Narrative   Louis Butler is a 63 y.o. year old male with medical history significant for  who presented on 03/13/2019 with HTN, diabetes, not followed by PCP for at least 2 years and on no home medications presented to [facility] due to lower extremity rash x2 to 4 weeks with bilateral foot swelling and pain right greater than left.  Admitted to [facility] with working diagnosis of AKI with creatinine of 5.24 (baseline 1.2, 11/2016), acute anemia ( hgb 9 , from previous baseline of 9) without bleeding and urinary retention and concern for UTI started on ceftriaxone. Patient was transferred to [facility] and admitted with working diagnosis of UTI, AKI with urinary retention.    Hospital course complicated by nonblanching purpuric rash concerning for vasculitic process in the setting of AKI.  Subjective  Mr.  Muns today has some mild pain in both feet. Still making urine, no abdominal pain, No headache, No chest pain, No Nausea, No new weakness tingling or numbness, No Cough - SOB.   A & P    Nonoliguric AKI with urinary retention and bilateral hydronephrosis.  Improving with Foley catheter in place.  Suspect diabetes/HTN could also be contributing to to some element of chronicity.  Peak creatinine 5.24.  (Baseline 1.2 on 11/2016) -Nephrology recommends continuing to monitor function no current need for dialysis -Discontinue IV fluids and monitor closely -Start Flomax, continue closely monitor anticipate discontinue Foley next 24 to 48 hours  Nonblanching, purpuric rash.  Most concerning for vasculitic process in the setting of AKI, differential includes anti-GBM, AIN.  C3 slightly decreased, hep C/anti-GBM/hep  B/HIV/ANA unremarkable,  Non- Anion gap metabolic acidosis in the setting of AKI due to above. - start sodium bicarbonate -monitor BMP -DC IV fluids  Acute normocytic anemia, stable likely due to kidney disease, likely has some element of advanced chronic in the setting of diabetic nephropathy.  No signs of active bleeding here. MCV slightly up to 100 today,  -Monitor CBC  Mild hyponatremia, improving  Diabetes mellitus, A1c at goal.   -Closely monitor CBG, will likely need Metformin on discharge, monitor.  Hypertension, at goal. -Continue amlodipine 2.5 mg(started here)  Reported epistaxis.  No recurrent episodes here.hgb low as mentioned above, platelets unremarkable. -Continue to closely monitor  Tobacco/alcohol dependence.  Emphasized cessation of both.  Reports binge drinking on the weekends patient agreeable. -Monitor for alcohol withdrawal on CIWA protocol -Continue thiamine supplementation  Family Communication  : None at bedside  Code Status : Full code  Disposition Plan  : Close monitoring kidney function, metabolic acidosis  Consults  : Nephrology  Procedures  : None  DVT Prophylaxis  :  SCDs   Lab Results  Component Value Date   PLT 159 03/16/2019    Diet :  Diet Order            Diet heart healthy/carb modified Room service appropriate? Yes; Fluid consistency: Thin  Diet effective now               Inpatient Medications Scheduled Meds:  amLODipine  2.5 mg Oral Daily   Chlorhexidine Gluconate Cloth  6 each Topical Daily   insulin aspart  0-5 Units Subcutaneous QHS  insulin aspart  0-9 Units Subcutaneous TID WC   LORazepam  0-4 mg Intravenous Q12H   Or   LORazepam  0-4 mg Oral Q12H   sodium bicarbonate  650 mg Oral BID   tamsulosin  0.4 mg Oral QPC supper   thiamine  100 mg Oral Daily   Or   thiamine  100 mg Intravenous Daily   Continuous Infusions: PRN Meds:.acetaminophen, fentaNYL (SUBLIMAZE) injection, hydrALAZINE,  ondansetron **OR** ondansetron (ZOFRAN) IV, traMADol  Antibiotics  :   Anti-infectives (From admission, onward)   Start     Dose/Rate Route Frequency Ordered Stop   03/14/19 1900  cefTRIAXone (ROCEPHIN) 1 g in sodium chloride 0.9 % 100 mL IVPB     1 g 200 mL/hr over 30 Minutes Intravenous  Once 03/14/19 1001 03/14/19 2235   03/13/19 1945  cefTRIAXone (ROCEPHIN) 1 g in sodium chloride 0.9 % 100 mL IVPB     1 g 200 mL/hr over 30 Minutes Intravenous  Once 03/13/19 1932 03/13/19 2016       Objective   Vitals:   03/15/19 2046 03/16/19 0448 03/16/19 0926 03/16/19 1644  BP: 140/66 137/65 136/68 133/69  Pulse: 68 75 78 85  Resp: 17 17 16 18   Temp: 98.6 F (37 C) 99.2 F (37.3 C) 98.7 F (37.1 C) 99.2 F (37.3 C)  TempSrc: Oral Oral Oral Oral  SpO2: 100% 97% 98% 99%  Weight: 92.5 kg     Height:        SpO2: 99 %  Wt Readings from Last 3 Encounters:  03/15/19 92.5 kg  11/28/17 90.3 kg  02/06/17 90.7 kg     Intake/Output Summary (Last 24 hours) at 03/16/2019 2029 Last data filed at 03/16/2019 1733 Gross per 24 hour  Intake 3260.53 ml  Output 2575 ml  Net 685.53 ml    Physical Exam:  Awake Alert, Oriented X 3, Normal affect No new F.N deficits,  [place].AT, No JVD Symmetrical Chest wall movement, Good air movement bilaterally, CTAB RRR,No Gallops,Rubs or new Murmurs,  +ve B.Sounds, Abd Soft, No tenderness, No organomegaly appreciated, No rebound, guarding or rigidity. Lower extremities   Left lateral foot, 12/8  Left medial foot, 12/8  Right medial foot, 12/8  Right lateral foot, 12/8        I have personally reviewed the following:   Data Reviewed:  CBC Recent Labs  Lab 03/13/19 1745 03/14/19 1052 03/15/19 0555 03/16/19 0228  WBC 8.3 7.5 7.4 7.4  HGB 9.7* 8.3* 8.8* 8.1*  HCT 29.1* 25.5* 26.2* 24.3*  PLT 220 180 184 159  MCV 98.6 100.8* 99.2 100.8*  MCH 32.9 32.8 33.3 33.6  MCHC 33.3 32.5 33.6 33.3  RDW 14.6 14.8 14.6 14.6  LYMPHSABS 0.9 1.2   --   --   MONOABS 0.4 0.4  --   --   EOSABS 0.1 0.4  --   --   BASOSABS 0.0 0.0  --   --     Chemistries  Recent Labs  Lab 03/13/19 1745 03/14/19 1052 03/14/19 1824 03/15/19 0555 03/16/19 0228  NA 131* 132* 130* 132* 132*  K 4.6 4.5 4.9 4.5 4.5  CL 104 106 106 107 108  CO2 17* 18* 18* 18* 17*  GLUCOSE 144* 214* 152* 129* 104*  BUN 53* 56* 51* 50* 48*  CREATININE 5.24* 4.93* 4.80* 4.80* 4.29*  CALCIUM 8.4* 7.8* 8.1* 8.1* 7.6*  AST 20  --   --   --   --   ALT 13  --   --   --   --  ALKPHOS 98  --   --   --   --   BILITOT 0.8  --   --   --   --    ------------------------------------------------------------------------------------------------------------------ No results for input(s): CHOL, HDL, LDLCALC, TRIG, CHOLHDL, LDLDIRECT in the last 72 hours.  Lab Results  Component Value Date   HGBA1C 6.2 (H) 03/14/2019   ------------------------------------------------------------------------------------------------------------------ No results for input(s): TSH, T4TOTAL, T3FREE, THYROIDAB in the last 72 hours.  Invalid input(s): FREET3 ------------------------------------------------------------------------------------------------------------------ Recent Labs    03/14/19 1824  FERRITIN 283  TIBC 165*  IRON 63    Coagulation profile Recent Labs  Lab 03/13/19 1745 03/15/19 0555  INR 1.1 1.2    No results for input(s): DDIMER in the last 72 hours.  Cardiac Enzymes No results for input(s): CKMB, TROPONINI, MYOGLOBIN in the last 168 hours.  Invalid input(s): CK ------------------------------------------------------------------------------------------------------------------ No results found for: BNP  Micro Results Recent Results (from the past 240 hour(s))  SARS CORONAVIRUS 2 (TAT 6-24 HRS) Nasopharyngeal Nasopharyngeal Swab     Status: None   Collection Time: 03/13/19  7:11 PM   Specimen: Nasopharyngeal Swab  Result Value Ref Range Status   SARS Coronavirus  2 NEGATIVE NEGATIVE Final    Comment: (NOTE) SARS-CoV-2 target nucleic acids are NOT DETECTED. The SARS-CoV-2 RNA is generally detectable in upper and lower respiratory specimens during the acute phase of infection. Negative results do not preclude SARS-CoV-2 infection, do not rule out co-infections with other pathogens, and should not be used as the sole basis for treatment or other patient management decisions. Negative results must be combined with clinical observations, patient history, and epidemiological information. The expected result is Negative. Fact Sheet for Patients: SugarRoll.be Fact Sheet for Healthcare Providers: https://www.woods-mathews.com/ This test is not yet approved or cleared by the [place] FDA and  has been authorized for detection and/or diagnosis of SARS-CoV-2 by FDA under an Emergency Use Authorization (EUA). This EUA will remain  in effect (meaning this test can be used) for the duration of the COVID-19 declaration under Section 56 4(b)(1) of the Act, 21 U.S.C. section 360bbb-3(b)(1), unless the authorization is terminated or revoked sooner. Performed at [facility], [place] [address] [place],  [address]     Radiology Reports Ct Abdomen Pelvis Wo Contrast  Result Date: 03/14/2019 CLINICAL DATA:  Renal failure.  Anemia.  Microscopic hematuria. EXAM: CT ABDOMEN AND PELVIS WITHOUT CONTRAST TECHNIQUE: Multidetector CT imaging of the abdomen and pelvis was performed following the standard protocol without IV contrast. COMPARISON:  03/13/2019 renal ultrasound.  No prior CT. FINDINGS: Lower chest: Emphysema. Normal heart size without pericardial or pleural effusion. Multivessel coronary artery atherosclerosis. Hepatobiliary: Normal noncontrast appearance of the liver. Small gallstones without acute cholecystitis or biliary duct dilatation. Pancreas: Normal, without mass or ductal dilatation. Spleen: Normal  in size, without focal abnormality. Adrenals/Urinary Tract: Normal adrenal glands. Mild renal cortical thinning bilaterally. Perirenal interstitial thickening. Exophytic interpolar right renal 3.8 cm lesion measures 48 HU on 31/3. No renal calculi. Mild bilateral hydroureteronephrosis. Hydroureter is followed to the level of the urinary bladder, without obstructive mass. There is a nonobstructive punctate mid right ureteric stone on 59/3 and sagittal image 85. Foley catheter within the urinary bladder. The bladder is thick walled with mild surrounding edema. Stomach/Bowel: Normal stomach, without wall thickening. Normal colon, appendix, and terminal ileum. Normal small bowel. Vascular/Lymphatic: Advanced aortic and branch vessel atherosclerosis. Mildly prominent abdominal retroperitoneal nodes are not pathologic by size criteria and likely reactive. Prominent bilateral inguinal nodes  are also identified and favored to be reactive. Reproductive: Normal prostate. Other: No significant free fluid. Musculoskeletal: Lumbosacral spondylosis. Minimal wedging of the T11 superior endplate. IMPRESSION: 1. Bilateral mild hydroureteronephrosis, followed to the level of the urinary bladder. The bladder is thick walled with surrounding pericystic edema in the setting of a Foley catheter. Considerations include cystitis and/or bladder outlet obstruction. Perirenal interstitial thickening is nonspecific but likely related to the clinical history of renal insufficiency. 2. 3 mm mid right ureteric nonobstructive stone. 3. Right renal mass is technically indeterminate on noncontrast exam. Most consistent with a cyst on yesterday's ultrasound. Consider surveillance with renal ultrasound at 6 months and attention to the right renal lesion. 4. Coronary artery atherosclerosis. Aortic Atherosclerosis (ICD10-I70.0). Emphysema (ICD10-J43.9). 5. Cholelithiasis. Electronically Signed   By: Abigail Miyamoto M.D.   On: 03/14/2019 19:44   US  Renal  Result Date: 03/13/2019 CLINICAL DATA:  63 year old male with acute renal failure. History of diabetes. EXAM: RENAL / URINARY TRACT ULTRASOUND COMPLETE COMPARISON:  None. FINDINGS: Evaluation is limited as the patient was not able to cooperate with exam and due to portable technique. Right Kidney: Renal measurements: 12.6 x 4.4 x 4.1 cm = volume: 117 mL. There is moderate parenchyma atrophy. There is increased renal parenchymal echogenicity. There is a 3.3 x 2.4 x 3.1 cm upper pole cyst. There is moderate right hydronephrosis. No shadowing stone. Left Kidney: Renal measurements: 12.8 x 4.8 x 5.1 cm = volume: 164 ML. There is mild parenchyma atrophy and cortical thinning. There is increased renal parenchymal echogenicity. Mild-to-moderate hydronephrosis. No shadowing stone. Bladder: Not well visualized. Other: None. IMPRESSION: 1. Increased renal parenchyma echogenicity in keeping with chronic kidney disease. 2. Bilateral hydronephrosis, right greater left. No shadowing stone. Electronically Signed   By: Anner Crete M.D.   On: 03/13/2019 20:15   US Venous Img Lower Bilateral  Result Date: 03/13/2019 CLINICAL DATA:  Leg swelling right greater than left, calf pain and bilateral foot pain EXAM: BILATERAL LOWER EXTREMITY VENOUS DOPPLER ULTRASOUND TECHNIQUE: Gray-scale sonography with graded compression, as well as color Doppler and duplex ultrasound were performed to evaluate the lower extremity deep venous systems from the level of the common femoral vein and including the common femoral, femoral, profunda femoral, popliteal and calf veins including the posterior tibial, peroneal and gastrocnemius veins when visible. The superficial great saphenous vein was also interrogated. Spectral Doppler was utilized to evaluate flow at rest and with distal augmentation maneuvers in the common femoral, femoral and popliteal veins. COMPARISON:  None FINDINGS: RIGHT LOWER EXTREMITY Common Femoral Vein: No evidence  of thrombus. Normal compressibility, respiratory phasicity and response to augmentation. Saphenofemoral Junction: No evidence of thrombus. Normal compressibility and flow on color Doppler imaging. Profunda Femoral Vein: No evidence of thrombus. Normal compressibility and flow on color Doppler imaging. Femoral Vein: No evidence of thrombus. Normal compressibility, respiratory phasicity and response to augmentation. Popliteal Vein: No evidence of thrombus. Normal compressibility, respiratory phasicity and response to augmentation. Calf Veins: No evidence of thrombus. Normal compressibility and flow on color Doppler imaging. Superficial Great Saphenous Vein: No evidence of thrombus. Normal compressibility. Venous Reflux:  None. Other Findings:  None. LEFT LOWER EXTREMITY Common Femoral Vein: No evidence of thrombus. Normal compressibility, respiratory phasicity and response to augmentation. Saphenofemoral Junction: No evidence of thrombus. Normal compressibility and flow on color Doppler imaging. Profunda Femoral Vein: No evidence of thrombus. Normal compressibility and flow on color Doppler imaging. Femoral Vein: No evidence of thrombus. Normal compressibility, respiratory phasicity and response  to augmentation. Popliteal Vein: No evidence of thrombus. Normal compressibility, respiratory phasicity and response to augmentation. Calf Veins: No evidence of thrombus. Normal compressibility and flow on color Doppler imaging. Superficial Great Saphenous Vein: No evidence of thrombus. Normal compressibility. Venous Reflux:  None. Other Findings: Atheromatous plaque throughout visualized arterial vessels with abundant eccentric soft plaque in the right popliteal artery. IMPRESSION: No evidence of deep venous thrombosis in either lower extremity. Signs of arterial vascular plaque with more pronounced changes incidentally noted in the right popliteal artery. Electronically Signed   By: Zetta Bills M.D.   On: 03/13/2019 19:03    Dg Chest Port 1 View  Result Date: 03/13/2019 CLINICAL DATA:  Renal failure.  Hypertension and smoking. EXAM: PORTABLE CHEST 1 VIEW COMPARISON:  None. FINDINGS: The heart size and mediastinal contours are within normal limits. Both lungs are clear. The visualized skeletal structures are unremarkable. IMPRESSION: No active disease. Electronically Signed   By: Constance Holster M.D.   On: 03/13/2019 20:36   Vas [place] Burnard Bunting With/wo Tbi  Result Date: 03/15/2019 LOWER EXTREMITY DOPPLER STUDY  Performing Technologist: Lita Mains RDMS, RVT  Examination Guidelines: A complete evaluation includes at minimum, Doppler waveform signals and systolic blood pressure reading at the level of bilateral brachial, anterior tibial, and posterior tibial arteries, when vessel segments are accessible. Bilateral testing is considered an integral part of a complete examination. Photoelectric Plethysmograph (PPG) waveforms and toe systolic pressure readings are included as required and additional duplex testing as needed. Limited examinations for reoccurring indications may be performed as noted.  ABI Findings: +--------+------------------+-----+---------+--------+  Right    Rt Pressure (mmHg) Index Waveform  Comment   +--------+------------------+-----+---------+--------+  Brachial 181                      triphasic           +--------+------------------+-----+---------+--------+  PTA      219                1.21  triphasic           +--------+------------------+-----+---------+--------+  DP       222                1.23  triphasic           +--------+------------------+-----+---------+--------+ +--------+------------------+-----+---------+-------+  Left     Lt Pressure (mmHg) Index Waveform  Comment  +--------+------------------+-----+---------+-------+  Brachial 176                      triphasic          +--------+------------------+-----+---------+-------+  PTA      220                1.22  biphasic            +--------+------------------+-----+---------+-------+  DP       217                1.20  triphasic          +--------+------------------+-----+---------+-------+ +-------+-----------+-----------+------------+------------+  ABI/TBI Today's ABI Today's TBI Previous ABI Previous TBI  +-------+-----------+-----------+------------+------------+  Right   1.23                                               +-------+-----------+-----------+------------+------------+  Left    1.22                                               +-------+-----------+-----------+------------+------------+  Summary: Right: Resting right ankle-brachial index is within normal range. No evidence of significant right lower extremity arterial disease. Left: Resting left ankle-brachial index is within normal range. No evidence of significant left lower extremity arterial disease.  *See table(s) above for measurements and observations.  Electronically signed by Harold Barban MD on 03/15/2019 at 5:23:12 PM.   Final      Time Spent in minutes  30     Desiree Hane M.D on 03/16/2019 at 8:29 PM  To page go to [email or web address] - password [email or web address]

## 2019-03-16 NOTE — Progress Notes (Addendum)
I have personally seen and examined this patient and agree with the assessment/plan as outlined below by Edwena Felty PA student.  With evidence of AKI with associated vasculitis-appearing rash over LEs. Anti-GBM negative, mildly depressed C3 with normal C4, ANCA pending. Overall GFR appears to be improving without acute dialysis needs. Good UOP and acceptable volume status. Will stop NS and begin sodium bicarbonate for mild NAGMA.  Start tamsulosin with goal to remove foley in 24-48hr.    Louis Butler K.,MD 03/16/2019 11:34 AM    Admit: 03/13/2019 LOS: 3   Subjective:  Louis Butler reports good oral intake yesterday. Having continued bilateral numbness sensation toes and pains in ankles. No new complaints  12/08 0701 - 12/09 0700 In: 2804.5 [P.O.:840; I.V.:1964.5] Out: 2825 [Urine:2825]  Filed Weights   03/13/19 1657 03/15/19 2046  Weight: 92.5 kg 92.5 kg    Scheduled Meds: . amLODipine  2.5 mg Oral Daily  . Chlorhexidine Gluconate Cloth  6 each Topical Daily  . insulin aspart  0-5 Units Subcutaneous QHS  . insulin aspart  0-9 Units Subcutaneous TID WC  . LORazepam  0-4 mg Intravenous Q12H   Or  . LORazepam  0-4 mg Oral Q12H  . thiamine  100 mg Oral Daily   Or  . thiamine  100 mg Intravenous Daily   Continuous Infusions: . sodium chloride 100 mL/hr at 03/15/19 2009   PRN Meds:.fentaNYL (SUBLIMAZE) injection, hydrALAZINE, ondansetron **OR** ondansetron (ZOFRAN) IV  Current Labs:   Recent Labs  Lab 03/14/19 1824 03/15/19 0555 03/16/19 0228  NA 130* 132* 132*  K 4.9 4.5 4.5  CL 106 107 108  CO2 18* 18* 17*  GLUCOSE 152* 129* 104*  BUN 51* 50* 48*  CREATININE 4.80* 4.80* 4.29*  CALCIUM 8.1* 8.1* 7.6*   Recent Labs  Lab 03/13/19 1745 03/14/19 1052 03/15/19 0555 03/16/19 0228  WBC 8.3 7.5 7.4 7.4  NEUTROABS 6.8 5.5  --   --   HGB 9.7* 8.3* 8.8* 8.1*  HCT 29.1* 25.5* 26.2* 24.3*  MCV 98.6 100.8* 99.2 100.8*  PLT 220 180 184 159    Physical Exam:  Blood  pressure 137/65, pulse 75, temperature 99.2 F (37.3 C), temperature source Oral, resp. rate 17, height 6\' 2"  (1.88 m), weight 92.5 kg, SpO2 97 %. Physical Exam  Constitutional: He is oriented to person, place, and time. He appears well-developed and well-nourished. No distress.  Pulmonary/Chest: Effort normal.  Diminished breath sounds  Abdominal: Soft. Bowel sounds are normal.  Neurological: He is alert and oriented to person, place, and time.  Skin: Skin is warm and dry. Rash noted.  Psychiatric: He has a normal mood and affect.    Assessment and Plan Assessment/Plan:  Acute Kidney Injury. Unknown baseline creatinine but 11/2017 labs show normal GFR and creatinine of 1.20. Renal function has minimally improved overnight.  Labwork so far shows slightly decreased C3 concerning for an immune mediated process. -     Discontinue fluids. No acute need for dialysis. 2. Hyponatremia. Likely secondary to his impaired kidney function or chronic alcohol use, 11/2017 labwork showed normal sodium, however sodium remains unchanged from yesterday.  3.   Non anion gap metabolic acidosis. Likely secondary to his impaired kidney function. Bicarb back down to 17 today. - Sodium Bicarb 650mg  BID 4.   Normocytic anemia. Possibly due to chronic alcohol use. Hgb 8.1 today. Iron studies showed normal iron and decreased TIBC. Hold off of ESA at this time.  5.   Hypertension. Intermittently elevated over the  past 24hrs. Amlodipine 2.5mg  6.   Diabetes. States he took metformin at one time. A1C 6.2. Started on insulin yesterday   Edwena Felty PA student 03/16/2019, 9:22 AM

## 2019-03-17 DIAGNOSIS — D649 Anemia, unspecified: Secondary | ICD-10-CM

## 2019-03-17 LAB — RENAL FUNCTION PANEL
Albumin: 2 g/dL — ABNORMAL LOW (ref 3.5–5.0)
Anion gap: 6 (ref 5–15)
BUN: 45 mg/dL — ABNORMAL HIGH (ref 8–23)
CO2: 20 mmol/L — ABNORMAL LOW (ref 22–32)
Calcium: 7.7 mg/dL — ABNORMAL LOW (ref 8.9–10.3)
Chloride: 105 mmol/L (ref 98–111)
Creatinine, Ser: 4.55 mg/dL — ABNORMAL HIGH (ref 0.61–1.24)
GFR calc Af Amer: 15 mL/min — ABNORMAL LOW (ref >60)
GFR calc non Af Amer: 13 mL/min — ABNORMAL LOW (ref >60)
Glucose, Bld: 103 mg/dL — ABNORMAL HIGH (ref 70–99)
Phosphorus: 3.2 mg/dL (ref 2.5–4.6)
Potassium: 4.3 mmol/L (ref 3.5–5.1)
Sodium: 131 mmol/L — ABNORMAL LOW (ref 135–145)

## 2019-03-17 LAB — CBC
HCT: 22.8 % — ABNORMAL LOW (ref 39.0–52.0)
Hemoglobin: 7.7 g/dL — ABNORMAL LOW (ref 13.0–17.0)
MCH: 33.5 pg (ref 26.0–34.0)
MCHC: 33.8 g/dL (ref 30.0–36.0)
MCV: 99.1 fL (ref 80.0–100.0)
Platelets: 150 10*3/uL (ref 150–400)
RBC: 2.3 MIL/uL — ABNORMAL LOW (ref 4.22–5.81)
RDW: 14.6 % (ref 11.5–15.5)
WBC: 7.9 10*3/uL (ref 4.0–10.5)
nRBC: 0 % (ref 0.0–0.2)

## 2019-03-17 LAB — GLUCOSE, CAPILLARY
Glucose-Capillary: 105 mg/dL — ABNORMAL HIGH (ref 70–99)
Glucose-Capillary: 133 mg/dL — ABNORMAL HIGH (ref 70–99)
Glucose-Capillary: 147 mg/dL — ABNORMAL HIGH (ref 70–99)
Glucose-Capillary: 154 mg/dL — ABNORMAL HIGH (ref 70–99)

## 2019-03-17 MED ORDER — DARBEPOETIN ALFA 100 MCG/0.5ML IJ SOSY
100.0000 ug | PREFILLED_SYRINGE | INTRAMUSCULAR | Status: DC
  Administered 2019-03-17 – 2019-03-24 (×2): 100 ug via SUBCUTANEOUS
  Filled 2019-03-17 (×2): qty 0.5

## 2019-03-17 NOTE — Progress Notes (Signed)
  Admit: 03/13/2019 LOS: 4   Subjective:  Reports that he continues to feel better and complains of some numbness in his toes/legs.  Rash unchanged.   12/09 0701 - 12/10 0700 In: 0160 [P.O.:1818] Out: 2400 [Urine:2400]  Filed Weights   03/13/19 1657 03/15/19 2046 03/16/19 2043  Weight: 92.5 kg 92.5 kg 92.5 kg    Scheduled Meds: . amLODipine  2.5 mg Oral Daily  . Chlorhexidine Gluconate Cloth  6 each Topical Daily  . insulin aspart  0-5 Units Subcutaneous QHS  . insulin aspart  0-9 Units Subcutaneous TID WC  . LORazepam  0-4 mg Intravenous Q12H   Or  . LORazepam  0-4 mg Oral Q12H  . sodium bicarbonate  650 mg Oral BID  . tamsulosin  0.4 mg Oral QPC supper  . thiamine  100 mg Oral Daily   Or  . thiamine  100 mg Intravenous Daily    PRN Meds:.acetaminophen, fentaNYL (SUBLIMAZE) injection, hydrALAZINE, ondansetron **OR** ondansetron (ZOFRAN) IV, traMADol  Current Labs:   Recent Labs  Lab 03/15/19 0555 03/16/19 0228 03/17/19 0438  NA 132* 132* 131*  K 4.5 4.5 4.3  CL 107 108 105  CO2 18* 17* 20*  GLUCOSE 129* 104* 103*  BUN 50* 48* 45*  CREATININE 4.80* 4.29* 4.55*  CALCIUM 8.1* 7.6* 7.7*  PHOS  --   --  3.2   Recent Labs  Lab 03/13/19 1745 03/14/19 1052 03/15/19 0555 03/16/19 0228  WBC 8.3 7.5 7.4 7.4  NEUTROABS 6.8 5.5  --   --   HGB 9.7* 8.3* 8.8* 8.1*  HCT 29.1* 25.5* 26.2* 24.3*  MCV 98.6 100.8* 99.2 100.8*  PLT 220 180 184 159    Physical Exam:  Blood pressure 131/67, pulse 76, temperature 98.3 F (36.8 C), temperature source Oral, resp. rate 18, height 6\' 2"  (1.88 m), weight 92.5 kg, SpO2 97 %. Physical Exam  Constitutional: He is oriented to person, place, and time. He appears well-developed and well-nourished. No distress.  Pulmonary/Chest: Effort normal and breath sounds normal.  Abdominal: Soft. Bowel sounds are normal.  Neurological: He is alert and oriented to person, place, and time.  Skin: Skin is warm and dry. Rash noted.  Psychiatric:  He has a normal mood and affect.    Assessment and Plan  1.  Acute Kidney Injury. Vasculitis appearing rash over lower extremities bilterally.Unknown baseline creatinine but 11/2017 labs show normal GFR and creatinine of 1.20. Labwork so far shows slightly decreased C3 concerning for an immune mediated process. Patient has continued to have good urine output.  Serologies so far negative for ANCA vasculitis as well as negative for plasma cell dyscrasia. Will order renal biopsy by IR.  [facility] pathology form filled out and placed in shadow chart. 2. Hyponatremia. Likely secondary to his impaired kidney function or chronic alcohol use, 11/2017 labwork showed normal sodium, however sodium remains in low 130's. 3.   Non anion gap metabolic acidosis. Likely secondary to his impaired kidney function. Bicarb improved from 17 to 20 - Sodium Bicarb 650mg  BID  4.   Normocytic anemia. Possibly due to chronic alcohol use. Hgb 8.1 on most recent lab value. Iron studies showed normal iron and decreased TIBC.  Will give ESA today. 5.   Hypertension. Normotensive with Amlodipine 2.5mg  6.   Diabetes. States he took metformin at one time. A1C 6.2. Started on insulin yesterday  Elmarie Shiley MD [facility]. Office # [phone] Pager # [phone] 12:05 PM

## 2019-03-17 NOTE — Consult Note (Addendum)
Chief Complaint: Patient was seen in consultation today for a random renal biopsy.  Referring Physician(s): Dr. Posey Pronto  Supervising Physician: Corrie Mckusick  Patient Status: [facility] - In-pt  History of Present Illness: Louis Butler is a 63 y.o. male with a past medical history significant for DM and HTN who presented to [place] ED on 03/13/19 with complaints of bilateral lower extremity rash and right leg pain. He reported that he noted the rash approximately 4 weeks prior however the right leg pain began 2 days prior and was associated with pain with ambulation. Workup showed an acute drop in hgb from 15 to 9, AKI with creatinine increasing from 1.2 to 5.24 and (+) UA - he was admitted to [facility] for further evaluation and management. He reported that he had not been seen by a PCP for at least 2 years and did not take any medications at home. He was seen by nephrology and there was concern noted for possible immune mediated process. IR has been consulted for a random renal biopsy to further evaluate AKI.  Louis Butler is sitting comfortably in bed watching TV, lunch tray at bedside with ~50% eaten - he states his appetite has been ok but doesn't like the taste of the hospital food. He reports one episode of vomiting this morning "after the nurse gave me some different medicine" and he has felt fine since. He states that he would like to proceed with biopsy to find out what is going on and hopefully so he can return home soon.   Past Medical History:  Diagnosis Date   ARF (acute renal failure) ([place]) 03/14/2019   Cellulitis 03/14/2019   feet   Diabetes mellitus without complication ([place])    Hypertension    Irregular heart beat    UTI (urinary tract infection)     Past Surgical History:  Procedure Laterality Date   WISDOM TOOTH EXTRACTION      Allergies: Hydrocodone and Percocet [oxycodone-acetaminophen]  Medications: Prior to Admission medications   Medication Sig Start Date  End Date Taking? Authorizing Provider  ibuprofen (ADVIL) 200 MG tablet Take 800 mg by mouth every 6 (six) hours as needed (pain).   Yes [provider]  amLODipine (NORVASC) 10 MG tablet Take 1 tablet (10 mg total) by mouth daily. Patient not taking: Reported on 03/14/2019 05/11/16   Kinnie Feil, PA-C  metFORMIN (GLUCOPHAGE) 500 MG tablet Take 1 tablet (500 mg total) by mouth 2 (two) times daily with a meal. Patient not taking: Reported on 03/14/2019 05/11/16   Kinnie Feil, PA-C     Family History  Problem Relation Age of Onset   Diabetes type II Mother    Lung cancer Mother    Hypertension Mother    Diabetes type II Father    Diabetes type II Sister    Diabetes type II Brother    Hypertension Brother    CAD Neg Hx     Social History   Socioeconomic History   Marital status: Married    Spouse name: Not on file   Number of children: Not on file   Years of education: Not on file   Highest education level: Not on file  Occupational History   Not on file  Tobacco Use   Smoking status: Current Every Day Smoker    Types: Cigarettes   Smokeless tobacco: Never Used  Substance and Sexual Activity   Alcohol use: Yes    Comment: 12 pack a week, couple beers  a day   Drug use: No   Sexual activity: Not on file  Other Topics Concern   Not on file  Social History Narrative   Not on file   Social Determinants of Health   Financial Resource Strain:    Difficulty of Paying Living Expenses: Not on file  Food Insecurity:    Worried About [place] in the Last Year: Not on file   Ran Out of Food in the Last Year: Not on file  Transportation Needs:    Lack of Transportation (Medical): Not on file   Lack of Transportation (Non-Medical): Not on file  Physical Activity:    Days of Exercise per Week: Not on file   Minutes of Exercise per Session: Not on file  Stress:    Feeling of Stress : Not on file  Social Connections:     Frequency of Communication with Friends and Family: Not on file   Frequency of Social Gatherings with Friends and Family: Not on file   Attends Religious Services: Not on file   Active Member of Clubs or Organizations: Not on file   Attends Archivist Meetings: Not on file   Marital Status: Not on file     Review of Systems: A 12 point ROS discussed and pertinent positives are indicated in the HPI above.  All other systems are negative.  Review of Systems  Constitutional: Negative for chills and fever.  Respiratory: Negative for cough and shortness of breath.   Cardiovascular: Negative for chest pain.  Gastrointestinal: Positive for vomiting. Negative for abdominal pain, blood in stool, diarrhea and nausea (x1 this morning).  Genitourinary: Negative for hematuria.  Musculoskeletal: Negative for back pain.  Skin: Positive for rash.  Neurological: Negative for dizziness and headaches.    Vital Signs: BP 131/67 (BP Location: Left Arm)    Pulse 76    Temp 98.3 F (36.8 C) (Oral)    Resp 18    Ht 6\' 2"  (1.88 m)    Wt 203 lb 14.8 oz (92.5 kg)    SpO2 97%    BMI 26.18 kg/m   Physical Exam Vitals and nursing note reviewed.  Constitutional:      General: He is not in acute distress. HENT:     Head: Normocephalic.     Mouth/Throat:     Mouth: Mucous membranes are moist.     Pharynx: Oropharynx is clear. No oropharyngeal exudate or posterior oropharyngeal erythema.  Cardiovascular:     Rate and Rhythm: Normal rate and regular rhythm.  Pulmonary:     Effort: Pulmonary effort is normal.     Breath sounds: Normal breath sounds.  Abdominal:     General: There is no distension.     Palpations: Abdomen is soft.     Tenderness: There is no abdominal tenderness.  Genitourinary:    Comments: (+) foley draining clear, golden yellow urine Skin:    General: Skin is warm and dry.     Findings: Rash (diffuse purpuritic rash to trunk, neck and lower extremities.) present.    Neurological:     Mental Status: He is alert and oriented to person, place, and time.  Psychiatric:        Mood and Affect: Mood normal.        Behavior: Behavior normal.        Thought Content: Thought content normal.        Judgment: Judgment normal.      MD Evaluation Airway: WNL  Heart: WNL Abdomen: WNL Chest/ Lungs: WNL ASA  Classification: 2 Mallampati/Airway Score: Two   Imaging: CT ABDOMEN PELVIS WO CONTRAST  Result Date: 03/14/2019 CLINICAL DATA:  Renal failure.  Anemia.  Microscopic hematuria. EXAM: CT ABDOMEN AND PELVIS WITHOUT CONTRAST TECHNIQUE: Multidetector CT imaging of the abdomen and pelvis was performed following the standard protocol without IV contrast. COMPARISON:  03/13/2019 renal ultrasound.  No prior CT. FINDINGS: Lower chest: Emphysema. Normal heart size without pericardial or pleural effusion. Multivessel coronary artery atherosclerosis. Hepatobiliary: Normal noncontrast appearance of the liver. Small gallstones without acute cholecystitis or biliary duct dilatation. Pancreas: Normal, without mass or ductal dilatation. Spleen: Normal in size, without focal abnormality. Adrenals/Urinary Tract: Normal adrenal glands. Mild renal cortical thinning bilaterally. Perirenal interstitial thickening. Exophytic interpolar right renal 3.8 cm lesion measures 48 HU on 31/3. No renal calculi. Mild bilateral hydroureteronephrosis. Hydroureter is followed to the level of the urinary bladder, without obstructive mass. There is a nonobstructive punctate mid right ureteric stone on 59/3 and sagittal image 85. Foley catheter within the urinary bladder. The bladder is thick walled with mild surrounding edema. Stomach/Bowel: Normal stomach, without wall thickening. Normal colon, appendix, and terminal ileum. Normal small bowel. Vascular/Lymphatic: Advanced aortic and branch vessel atherosclerosis. Mildly prominent abdominal retroperitoneal nodes are not pathologic by size criteria and  likely reactive. Prominent bilateral inguinal nodes are also identified and favored to be reactive. Reproductive: Normal prostate. Other: No significant free fluid. Musculoskeletal: Lumbosacral spondylosis. Minimal wedging of the T11 superior endplate. IMPRESSION: 1. Bilateral mild hydroureteronephrosis, followed to the level of the urinary bladder. The bladder is thick walled with surrounding pericystic edema in the setting of a Foley catheter. Considerations include cystitis and/or bladder outlet obstruction. Perirenal interstitial thickening is nonspecific but likely related to the clinical history of renal insufficiency. 2. 3 mm mid right ureteric nonobstructive stone. 3. Right renal mass is technically indeterminate on noncontrast exam. Most consistent with a cyst on yesterday's ultrasound. Consider surveillance with renal ultrasound at 6 months and attention to the right renal lesion. 4. Coronary artery atherosclerosis. Aortic Atherosclerosis (ICD10-I70.0). Emphysema (ICD10-J43.9). 5. Cholelithiasis. Electronically Signed   By: Abigail Miyamoto M.D.   On: 03/14/2019 19:44   US Renal  Result Date: 03/13/2019 CLINICAL DATA:  63 year old male with acute renal failure. History of diabetes. EXAM: RENAL / URINARY TRACT ULTRASOUND COMPLETE COMPARISON:  None. FINDINGS: Evaluation is limited as the patient was not able to cooperate with exam and due to portable technique. Right Kidney: Renal measurements: 12.6 x 4.4 x 4.1 cm = volume: 117 mL. There is moderate parenchyma atrophy. There is increased renal parenchymal echogenicity. There is a 3.3 x 2.4 x 3.1 cm upper pole cyst. There is moderate right hydronephrosis. No shadowing stone. Left Kidney: Renal measurements: 12.8 x 4.8 x 5.1 cm = volume: 164 ML. There is mild parenchyma atrophy and cortical thinning. There is increased renal parenchymal echogenicity. Mild-to-moderate hydronephrosis. No shadowing stone. Bladder: Not well visualized. Other: None. IMPRESSION: 1.  Increased renal parenchyma echogenicity in keeping with chronic kidney disease. 2. Bilateral hydronephrosis, right greater left. No shadowing stone. Electronically Signed   By: Anner Crete M.D.   On: 03/13/2019 20:15   US Venous Img Lower Bilateral  Result Date: 03/13/2019 CLINICAL DATA:  Leg swelling right greater than left, calf pain and bilateral foot pain EXAM: BILATERAL LOWER EXTREMITY VENOUS DOPPLER ULTRASOUND TECHNIQUE: Gray-scale sonography with graded compression, as well as color Doppler and duplex ultrasound were performed to evaluate the lower extremity deep venous systems  from the level of the common femoral vein and including the common femoral, femoral, profunda femoral, popliteal and calf veins including the posterior tibial, peroneal and gastrocnemius veins when visible. The superficial great saphenous vein was also interrogated. Spectral Doppler was utilized to evaluate flow at rest and with distal augmentation maneuvers in the common femoral, femoral and popliteal veins. COMPARISON:  None FINDINGS: RIGHT LOWER EXTREMITY Common Femoral Vein: No evidence of thrombus. Normal compressibility, respiratory phasicity and response to augmentation. Saphenofemoral Junction: No evidence of thrombus. Normal compressibility and flow on color Doppler imaging. Profunda Femoral Vein: No evidence of thrombus. Normal compressibility and flow on color Doppler imaging. Femoral Vein: No evidence of thrombus. Normal compressibility, respiratory phasicity and response to augmentation. Popliteal Vein: No evidence of thrombus. Normal compressibility, respiratory phasicity and response to augmentation. Calf Veins: No evidence of thrombus. Normal compressibility and flow on color Doppler imaging. Superficial Great Saphenous Vein: No evidence of thrombus. Normal compressibility. Venous Reflux:  None. Other Findings:  None. LEFT LOWER EXTREMITY Common Femoral Vein: No evidence of thrombus. Normal compressibility,  respiratory phasicity and response to augmentation. Saphenofemoral Junction: No evidence of thrombus. Normal compressibility and flow on color Doppler imaging. Profunda Femoral Vein: No evidence of thrombus. Normal compressibility and flow on color Doppler imaging. Femoral Vein: No evidence of thrombus. Normal compressibility, respiratory phasicity and response to augmentation. Popliteal Vein: No evidence of thrombus. Normal compressibility, respiratory phasicity and response to augmentation. Calf Veins: No evidence of thrombus. Normal compressibility and flow on color Doppler imaging. Superficial Great Saphenous Vein: No evidence of thrombus. Normal compressibility. Venous Reflux:  None. Other Findings: Atheromatous plaque throughout visualized arterial vessels with abundant eccentric soft plaque in the right popliteal artery. IMPRESSION: No evidence of deep venous thrombosis in either lower extremity. Signs of arterial vascular plaque with more pronounced changes incidentally noted in the right popliteal artery. Electronically Signed   By: Zetta Bills M.D.   On: 03/13/2019 19:03   DG Chest Port 1 View  Result Date: 03/13/2019 CLINICAL DATA:  Renal failure.  Hypertension and smoking. EXAM: PORTABLE CHEST 1 VIEW COMPARISON:  None. FINDINGS: The heart size and mediastinal contours are within normal limits. Both lungs are clear. The visualized skeletal structures are unremarkable. IMPRESSION: No active disease. Electronically Signed   By: Constance Holster M.D.   On: 03/13/2019 20:36   VAS [place] ABI WITH/WO TBI  Result Date: 03/15/2019 LOWER EXTREMITY DOPPLER STUDY  Performing Technologist: Lita Mains RDMS, RVT  Examination Guidelines: A complete evaluation includes at minimum, Doppler waveform signals and systolic blood pressure reading at the level of bilateral brachial, anterior tibial, and posterior tibial arteries, when vessel segments are accessible. Bilateral testing is considered an integral part  of a complete examination. Photoelectric Plethysmograph (PPG) waveforms and toe systolic pressure readings are included as required and additional duplex testing as needed. Limited examinations for reoccurring indications may be performed as noted.  ABI Findings: +--------+------------------+-----+---------+--------+  Right    Rt Pressure (mmHg) Index Waveform  Comment   +--------+------------------+-----+---------+--------+  Brachial 181                      triphasic           +--------+------------------+-----+---------+--------+  PTA      219                1.21  triphasic           +--------+------------------+-----+---------+--------+  DP       222  1.23  triphasic           +--------+------------------+-----+---------+--------+ +--------+------------------+-----+---------+-------+  Left     Lt Pressure (mmHg) Index Waveform  Comment  +--------+------------------+-----+---------+-------+  Brachial 176                      triphasic          +--------+------------------+-----+---------+-------+  PTA      220                1.22  biphasic           +--------+------------------+-----+---------+-------+  DP       217                1.20  triphasic          +--------+------------------+-----+---------+-------+ +-------+-----------+-----------+------------+------------+  ABI/TBI Today's ABI Today's TBI Previous ABI Previous TBI  +-------+-----------+-----------+------------+------------+  Right   1.23                                               +-------+-----------+-----------+------------+------------+  Left    1.22                                               +-------+-----------+-----------+------------+------------+  Summary: Right: Resting right ankle-brachial index is within normal range. No evidence of significant right lower extremity arterial disease. Left: Resting left ankle-brachial index is within normal range. No evidence of significant left lower extremity arterial disease.  *See  table(s) above for measurements and observations.  Electronically signed by Harold Barban MD on 03/15/2019 at 5:23:12 PM.   Final     Labs:  CBC: Recent Labs    03/14/19 1052 03/15/19 0555 03/16/19 0228 03/17/19 0934  WBC 7.5 7.4 7.4 7.9  HGB 8.3* 8.8* 8.1* 7.7*  HCT 25.5* 26.2* 24.3* 22.8*  PLT 180 184 159 150    COAGS: Recent Labs    03/13/19 1745 03/15/19 0555  INR 1.1 1.2    BMP: Recent Labs    03/14/19 1824 03/15/19 0555 03/16/19 0228 03/17/19 0438  NA 130* 132* 132* 131*  K 4.9 4.5 4.5 4.3  CL 106 107 108 105  CO2 18* 18* 17* 20*  GLUCOSE 152* 129* 104* 103*  BUN 51* 50* 48* 45*  CALCIUM 8.1* 8.1* 7.6* 7.7*  CREATININE 4.80* 4.80* 4.29* 4.55*  GFRNONAA 12* 12* 14* 13*  GFRAA 14* 14* 16* 15*    LIVER FUNCTION TESTS: Recent Labs    03/13/19 1745 03/17/19 0438  BILITOT 0.8  --   AST 20  --   ALT 13  --   ALKPHOS 98  --   PROT 7.9  --   ALBUMIN 3.0* 2.0*    TUMOR MARKERS: No results for input(s): AFPTM, CEA, CA199, CHROMGRNA in the last 8760 hours.  Assessment and Plan:  63 y/o M admitted for new onset AKI and diffuse purpuritic rash concerning for vasculitic process. He has been seen by nephrology and there is concern for an immune mediated process contributing to his poor renal function - IR has been asked to perform a random renal biopsy for further evaluation.  Will plan for random renal biopsy tomorrow morning in [place]. Patient to be NPO after midnight, no current anticoagulation/anti  platelet medications per chart (please hold heparin/lovenox until post procedure), AM labs pending, IR will call for patient when ready.   Risks and benefits of random renal biopsy was discussed with the patient and/or patient's family including, but not limited to bleeding, infection, damage to adjacent structures or low yield requiring additional tests.  All of the questions were answered and there is agreement to proceed.  Consent signed and in  chart.   Thank you for this interesting consult.  I greatly enjoyed meeting Louis Butler and look forward to participating in their care.  A copy of this report was sent to the requesting provider on this date.  Electronically Signed: Joaquim Nam, PA-C 03/17/2019, 1:16 PM   I spent a total of 20 Minutes n face to face in clinical consultation, greater than 50% of which was counseling/coordinating care for random renal biopsy.

## 2019-03-17 NOTE — Progress Notes (Signed)
[facility]  PROGRESS NOTE  Louis Butler SWN:[id number] DOB: [birth year] DOA: 03/13/2019 PCP: Patient, [name] Admit date - 03/13/2019   Admitting Physician Shela Leff, MD  Outpatient Primary MD for the patient is Patient, [name]  LOS - 4 Brief Narrative   Louis Butler is a 63 y.o. year old male with medical history significant for  who presented on 03/13/2019 with HTN, diabetes, not followed by PCP for at least 2 years and on no home medications presented to [facility] due to lower extremity rash x2 to 4 weeks with bilateral foot swelling and pain right greater than left.  Admitted to [facility] with working diagnosis of AKI with creatinine of 5.24 (baseline 1.2, 11/2016), acute anemia ( hgb 9 , from previous baseline of 9) without bleeding and urinary retention and concern for UTI started on ceftriaxone. Patient was transferred to [facility] and admitted with working diagnosis of UTI, AKI with urinary retention.    Hospital course complicated by nonblanching purpuric rash concerning for vasculitic process in the setting of AKI.  Subjective  Mr.  Butler today has some mild pain in both feet. Still making urine, no abdominal pain, No headache, No chest pain, No Nausea, No new weakness tingling or numbness, No Cough - SOB.   A & P    Nonoliguric AKI with urinary retention and bilateral hydronephrosis.  Improving with Foley catheter in place.  Suspect diabetes/HTN could also be contributing to to some element of chronicity.  Peak creatinine 5.24.  (Baseline 1.2 on 11/2016) -Nephrology recommends continuing to monitor function no current need for dialysis -plan for IR guided biopsy -Started Flomax here, continue closely monitor anticipate discontinue Foley next 24 to 48 hours  Nonblanching, purpuric rash.  Most concerning for vasculitic process in the setting of AKI, differential includes anti-GBM, AIN.  C3 slightly decreased, hep C/anti-GBM/hep B/HIV/ANA  unremarkable, -Plan for IR guided biopsy, on 12/11 NPO at midnight. Hold   Non- Anion gap metabolic acidosis in the setting of AKI due to above, improving after starting sodium bicarb - continue sodium bicarbonate 650 mg TID -monitor BMP  Normocytic anemia, stable likely due to kidney disease, likely has some element of advanced chronic in the setting of diabetic nephropathy.  No signs of active bleeding here.iron panel with wnl iron and decreased TIBC. MCV slightly up to 100 today,  --ESA per nephrology -Monitor CBC  Mild hyponatremia, stable. Could be combination of beer potomania from chronic alcohol as well as some increased volume from kidney dysfunction.  -monitor BMP  Diabetes mellitus, A1c at goal.   -Closely monitor CBG with sliding scale insulin as needed, will likely need Metformin on discharge, monitor.  Hypertension, at goal. -Continue amlodipine 2.5 mg(started here)  Reported epistaxis.  No recurrent episodes here.hgb low as mentioned above, platelets unremarkable. -Continue to closely monitor  Tobacco/alcohol dependence.  Emphasized cessation of both.  Reports binge drinking on the weekends patient agreeable. -Monitor for alcohol withdrawal on CIWA protocol -Continue thiamine supplementation  Family Communication  : None at bedside  Code Status : Full code  Disposition Plan  : Close monitoring kidney function, metabolic acidosis  Consults  : Nephrology  Procedures  : None  DVT Prophylaxis  :  SCDs   Lab Results  Component Value Date   PLT 150 03/17/2019    Diet :  Diet Order            Diet NPO time specified  Diet effective  midnight        Diet heart healthy/carb modified Room service appropriate? Yes; Fluid consistency: Thin  Diet effective now               Inpatient Medications Scheduled Meds: . amLODipine  2.5 mg Oral Daily  . Chlorhexidine Gluconate Cloth  6 each Topical Daily  . darbepoetin (ARANESP) injection - NON-DIALYSIS  100 mcg  Subcutaneous Q Thu-1800  . insulin aspart  0-5 Units Subcutaneous QHS  . insulin aspart  0-9 Units Subcutaneous TID WC  . LORazepam  0-4 mg Intravenous Q12H   Or  . LORazepam  0-4 mg Oral Q12H  . sodium bicarbonate  650 mg Oral BID  . tamsulosin  0.4 mg Oral QPC supper  . thiamine  100 mg Oral Daily   Or  . thiamine  100 mg Intravenous Daily   Continuous Infusions: PRN Meds:.acetaminophen, fentaNYL (SUBLIMAZE) injection, hydrALAZINE, ondansetron **OR** ondansetron (ZOFRAN) IV, traMADol  Antibiotics  :   Anti-infectives (From admission, onward)   Start     Dose/Rate Route Frequency Ordered Stop   03/14/19 1900  cefTRIAXone (ROCEPHIN) 1 g in sodium chloride 0.9 % 100 mL IVPB     1 g 200 mL/hr over 30 Minutes Intravenous  Once 03/14/19 1001 03/14/19 2235   03/13/19 1945  cefTRIAXone (ROCEPHIN) 1 g in sodium chloride 0.9 % 100 mL IVPB     1 g 200 mL/hr over 30 Minutes Intravenous  Once 03/13/19 1932 03/13/19 2016       Objective   Vitals:   03/16/19 1644 03/16/19 2043 03/17/19 0453 03/17/19 0900  BP: 133/69 121/80 110/64 131/67  Pulse: 85 79 85 76  Resp: 18 17 17 18   Temp: 99.2 F (37.3 C) 98.2 F (36.8 C) 98.4 F (36.9 C) 98.3 F (36.8 C)  TempSrc: Oral Oral Oral Oral  SpO2: 99% 98% 94% 97%  Weight:  92.5 kg    Height:        SpO2: 97 %  Wt Readings from Last 3 Encounters:  03/16/19 92.5 kg  11/28/17 90.3 kg  02/06/17 90.7 kg     Intake/Output Summary (Last 24 hours) at 03/17/2019 1505 Last data filed at 03/17/2019 1300 Gross per 24 hour  Intake 1440 ml  Output 1200 ml  Net 240 ml    Physical Exam:  Awake Alert, Oriented X 3, Normal affect No new F.N deficits,  .AT, No JVD Symmetrical Chest wall movement, Good air movement bilaterally, CTAB RRR,No Gallops,Rubs or new Murmurs,  +ve B.Sounds, Abd Soft, No tenderness, No organomegaly appreciated, No rebound, guarding or rigidity. Lower extremities   Left lateral foot, 12/8  Left medial foot,  12/8  Right medial foot, 12/8  Right lateral foot, 12/8        I have personally reviewed the following:   Data Reviewed:  CBC Recent Labs  Lab 03/13/19 1745 03/14/19 1052 03/15/19 0555 03/16/19 0228 03/17/19 0934  WBC 8.3 7.5 7.4 7.4 7.9  HGB 9.7* 8.3* 8.8* 8.1* 7.7*  HCT 29.1* 25.5* 26.2* 24.3* 22.8*  PLT 220 180 184 159 150  MCV 98.6 100.8* 99.2 100.8* 99.1  MCH 32.9 32.8 33.3 33.6 33.5  MCHC 33.3 32.5 33.6 33.3 33.8  RDW 14.6 14.8 14.6 14.6 14.6  LYMPHSABS 0.9 1.2  --   --   --   MONOABS 0.4 0.4  --   --   --   EOSABS 0.1 0.4  --   --   --   BASOSABS 0.0  0.0  --   --   --     Chemistries  Recent Labs  Lab 03/13/19 1745 03/14/19 1052 03/14/19 1824 03/15/19 0555 03/16/19 0228 03/17/19 0438  NA 131* 132* 130* 132* 132* 131*  K 4.6 4.5 4.9 4.5 4.5 4.3  CL 104 106 106 107 108 105  CO2 17* 18* 18* 18* 17* 20*  GLUCOSE 144* 214* 152* 129* 104* 103*  BUN 53* 56* 51* 50* 48* 45*  CREATININE 5.24* 4.93* 4.80* 4.80* 4.29* 4.55*  CALCIUM 8.4* 7.8* 8.1* 8.1* 7.6* 7.7*  AST 20  --   --   --   --   --   ALT 13  --   --   --   --   --   ALKPHOS 98  --   --   --   --   --   BILITOT 0.8  --   --   --   --   --    ------------------------------------------------------------------------------------------------------------------ No results for input(s): CHOL, HDL, LDLCALC, TRIG, CHOLHDL, LDLDIRECT in the last 72 hours.  Lab Results  Component Value Date   HGBA1C 6.2 (H) 03/14/2019   ------------------------------------------------------------------------------------------------------------------ No results for input(s): TSH, T4TOTAL, T3FREE, THYROIDAB in the last 72 hours.  Invalid input(s): FREET3 ------------------------------------------------------------------------------------------------------------------ Recent Labs    03/14/19 1824  FERRITIN 283  TIBC 165*  IRON 63    Coagulation profile Recent Labs  Lab 03/13/19 1745 03/15/19 0555  INR 1.1  1.2    No results for input(s): DDIMER in the last 72 hours.  Cardiac Enzymes No results for input(s): CKMB, TROPONINI, MYOGLOBIN in the last 168 hours.  Invalid input(s): CK ------------------------------------------------------------------------------------------------------------------ No results found for: BNP  Micro Results Recent Results (from the past 240 hour(s))  SARS CORONAVIRUS 2 (TAT 6-24 HRS) Nasopharyngeal Nasopharyngeal Swab     Status: None   Collection Time: 03/13/19  7:11 PM   Specimen: Nasopharyngeal Swab  Result Value Ref Range Status   SARS Coronavirus 2 NEGATIVE NEGATIVE Final    Comment: (NOTE) SARS-CoV-2 target nucleic acids are NOT DETECTED. The SARS-CoV-2 RNA is generally detectable in upper and lower respiratory specimens during the acute phase of infection. Negative results do not preclude SARS-CoV-2 infection, do not rule out co-infections with other pathogens, and should not be used as the sole basis for treatment or other patient management decisions. Negative results must be combined with clinical observations, patient history, and epidemiological information. The expected result is Negative. Fact Sheet for Patients: SugarRoll.be Fact Sheet for Healthcare Providers: https://www.woods-mathews.com/ This test is not yet approved or cleared by the [place] FDA and  has been authorized for detection and/or diagnosis of SARS-CoV-2 by FDA under an Emergency Use Authorization (EUA). This EUA will remain  in effect (meaning this test can be used) for the duration of the COVID-19 declaration under Section 56 4(b)(1) of the Act, 21 U.S.C. section 360bbb-3(b)(1), unless the authorization is terminated or revoked sooner. Performed at [facility], [place] [address] [place], [place] [address]     Radiology Reports CT ABDOMEN PELVIS WO CONTRAST  Result Date: 03/14/2019 CLINICAL DATA:  Renal failure.   Anemia.  Microscopic hematuria. EXAM: CT ABDOMEN AND PELVIS WITHOUT CONTRAST TECHNIQUE: Multidetector CT imaging of the abdomen and pelvis was performed following the standard protocol without IV contrast. COMPARISON:  03/13/2019 renal ultrasound.  No prior CT. FINDINGS: Lower chest: Emphysema. Normal heart size without pericardial or pleural effusion. Multivessel coronary artery atherosclerosis. Hepatobiliary: Normal noncontrast appearance of the  liver. Small gallstones without acute cholecystitis or biliary duct dilatation. Pancreas: Normal, without mass or ductal dilatation. Spleen: Normal in size, without focal abnormality. Adrenals/Urinary Tract: Normal adrenal glands. Mild renal cortical thinning bilaterally. Perirenal interstitial thickening. Exophytic interpolar right renal 3.8 cm lesion measures 48 HU on 31/3. No renal calculi. Mild bilateral hydroureteronephrosis. Hydroureter is followed to the level of the urinary bladder, without obstructive mass. There is a nonobstructive punctate mid right ureteric stone on 59/3 and sagittal image 85. Foley catheter within the urinary bladder. The bladder is thick walled with mild surrounding edema. Stomach/Bowel: Normal stomach, without wall thickening. Normal colon, appendix, and terminal ileum. Normal small bowel. Vascular/Lymphatic: Advanced aortic and branch vessel atherosclerosis. Mildly prominent abdominal retroperitoneal nodes are not pathologic by size criteria and likely reactive. Prominent bilateral inguinal nodes are also identified and favored to be reactive. Reproductive: Normal prostate. Other: No significant free fluid. Musculoskeletal: Lumbosacral spondylosis. Minimal wedging of the T11 superior endplate. IMPRESSION: 1. Bilateral mild hydroureteronephrosis, followed to the level of the urinary bladder. The bladder is thick walled with surrounding pericystic edema in the setting of a Foley catheter. Considerations include cystitis and/or bladder outlet  obstruction. Perirenal interstitial thickening is nonspecific but likely related to the clinical history of renal insufficiency. 2. 3 mm mid right ureteric nonobstructive stone. 3. Right renal mass is technically indeterminate on noncontrast exam. Most consistent with a cyst on yesterday's ultrasound. Consider surveillance with renal ultrasound at 6 months and attention to the right renal lesion. 4. Coronary artery atherosclerosis. Aortic Atherosclerosis (ICD10-I70.0). Emphysema (ICD10-J43.9). 5. Cholelithiasis. Electronically Signed   By: Abigail Miyamoto M.D.   On: 03/14/2019 19:44   US Renal  Result Date: 03/13/2019 CLINICAL DATA:  63 year old male with acute renal failure. History of diabetes. EXAM: RENAL / URINARY TRACT ULTRASOUND COMPLETE COMPARISON:  None. FINDINGS: Evaluation is limited as the patient was not able to cooperate with exam and due to portable technique. Right Kidney: Renal measurements: 12.6 x 4.4 x 4.1 cm = volume: 117 mL. There is moderate parenchyma atrophy. There is increased renal parenchymal echogenicity. There is a 3.3 x 2.4 x 3.1 cm upper pole cyst. There is moderate right hydronephrosis. No shadowing stone. Left Kidney: Renal measurements: 12.8 x 4.8 x 5.1 cm = volume: 164 ML. There is mild parenchyma atrophy and cortical thinning. There is increased renal parenchymal echogenicity. Mild-to-moderate hydronephrosis. No shadowing stone. Bladder: Not well visualized. Other: None. IMPRESSION: 1. Increased renal parenchyma echogenicity in keeping with chronic kidney disease. 2. Bilateral hydronephrosis, right greater left. No shadowing stone. Electronically Signed   By: Anner Crete M.D.   On: 03/13/2019 20:15   US Venous Img Lower Bilateral  Result Date: 03/13/2019 CLINICAL DATA:  Leg swelling right greater than left, calf pain and bilateral foot pain EXAM: BILATERAL LOWER EXTREMITY VENOUS DOPPLER ULTRASOUND TECHNIQUE: Gray-scale sonography with graded compression, as well as color  Doppler and duplex ultrasound were performed to evaluate the lower extremity deep venous systems from the level of the common femoral vein and including the common femoral, femoral, profunda femoral, popliteal and calf veins including the posterior tibial, peroneal and gastrocnemius veins when visible. The superficial great saphenous vein was also interrogated. Spectral Doppler was utilized to evaluate flow at rest and with distal augmentation maneuvers in the common femoral, femoral and popliteal veins. COMPARISON:  None FINDINGS: RIGHT LOWER EXTREMITY Common Femoral Vein: No evidence of thrombus. Normal compressibility, respiratory phasicity and response to augmentation. Saphenofemoral Junction: No evidence of thrombus. Normal compressibility and flow  on color Doppler imaging. Profunda Femoral Vein: No evidence of thrombus. Normal compressibility and flow on color Doppler imaging. Femoral Vein: No evidence of thrombus. Normal compressibility, respiratory phasicity and response to augmentation. Popliteal Vein: No evidence of thrombus. Normal compressibility, respiratory phasicity and response to augmentation. Calf Veins: No evidence of thrombus. Normal compressibility and flow on color Doppler imaging. Superficial Great Saphenous Vein: No evidence of thrombus. Normal compressibility. Venous Reflux:  None. Other Findings:  None. LEFT LOWER EXTREMITY Common Femoral Vein: No evidence of thrombus. Normal compressibility, respiratory phasicity and response to augmentation. Saphenofemoral Junction: No evidence of thrombus. Normal compressibility and flow on color Doppler imaging. Profunda Femoral Vein: No evidence of thrombus. Normal compressibility and flow on color Doppler imaging. Femoral Vein: No evidence of thrombus. Normal compressibility, respiratory phasicity and response to augmentation. Popliteal Vein: No evidence of thrombus. Normal compressibility, respiratory phasicity and response to augmentation. Calf  Veins: No evidence of thrombus. Normal compressibility and flow on color Doppler imaging. Superficial Great Saphenous Vein: No evidence of thrombus. Normal compressibility. Venous Reflux:  None. Other Findings: Atheromatous plaque throughout visualized arterial vessels with abundant eccentric soft plaque in the right popliteal artery. IMPRESSION: No evidence of deep venous thrombosis in either lower extremity. Signs of arterial vascular plaque with more pronounced changes incidentally noted in the right popliteal artery. Electronically Signed   By: Zetta Bills M.D.   On: 03/13/2019 19:03   DG Chest Port 1 View  Result Date: 03/13/2019 CLINICAL DATA:  Renal failure.  Hypertension and smoking. EXAM: PORTABLE CHEST 1 VIEW COMPARISON:  None. FINDINGS: The heart size and mediastinal contours are within normal limits. Both lungs are clear. The visualized skeletal structures are unremarkable. IMPRESSION: No active disease. Electronically Signed   By: Constance Holster M.D.   On: 03/13/2019 20:36   VAS [place] ABI WITH/WO TBI  Result Date: 03/15/2019 LOWER EXTREMITY DOPPLER STUDY  Performing Technologist: Lita Mains RDMS, RVT  Examination Guidelines: A complete evaluation includes at minimum, Doppler waveform signals and systolic blood pressure reading at the level of bilateral brachial, anterior tibial, and posterior tibial arteries, when vessel segments are accessible. Bilateral testing is considered an integral part of a complete examination. Photoelectric Plethysmograph (PPG) waveforms and toe systolic pressure readings are included as required and additional duplex testing as needed. Limited examinations for reoccurring indications may be performed as noted.  ABI Findings: +--------+------------------+-----+---------+--------+ Right   Rt Pressure (mmHg)IndexWaveform Comment  +--------+------------------+-----+---------+--------+ [id number]                    triphasic          +--------+------------------+-----+---------+--------+ PTA     219               1.21 triphasic         +--------+------------------+-----+---------+--------+ DP      222               1.23 triphasic         +--------+------------------+-----+---------+--------+ +--------+------------------+-----+---------+-------+ Left    Lt Pressure (mmHg)IndexWaveform Comment +--------+------------------+-----+---------+-------+ [id number]                    triphasic        +--------+------------------+-----+---------+-------+ PTA     220               1.22 biphasic         +--------+------------------+-----+---------+-------+ DP      217  1.20 triphasic        +--------+------------------+-----+---------+-------+ +-------+-----------+-----------+------------+------------+ ABI/TBIToday's ABIToday's TBIPrevious ABIPrevious TBI +-------+-----------+-----------+------------+------------+ Right  1.23                                           +-------+-----------+-----------+------------+------------+ Left   1.22                                           +-------+-----------+-----------+------------+------------+  Summary: Right: Resting right ankle-brachial index is within normal range. No evidence of significant right lower extremity arterial disease. Left: Resting left ankle-brachial index is within normal range. No evidence of significant left lower extremity arterial disease.  *See table(s) above for measurements and observations.  Electronically signed by Harold Barban MD on 03/15/2019 at 5:23:12 PM.   Final      Time Spent in minutes  30     Desiree Hane M.D on 03/17/2019 at 3:05 PM  To page go to [email or web address] - password [email or web address]

## 2019-03-18 ENCOUNTER — Inpatient Hospital Stay (HOSPITAL_COMMUNITY): Payer: BC Managed Care – PPO

## 2019-03-18 LAB — BASIC METABOLIC PANEL
Anion gap: 7 (ref 5–15)
BUN: 49 mg/dL — ABNORMAL HIGH (ref 8–23)
CO2: 20 mmol/L — ABNORMAL LOW (ref 22–32)
Calcium: 8.2 mg/dL — ABNORMAL LOW (ref 8.9–10.3)
Chloride: 104 mmol/L (ref 98–111)
Creatinine, Ser: 4.71 mg/dL — ABNORMAL HIGH (ref 0.61–1.24)
GFR calc Af Amer: 14 mL/min — ABNORMAL LOW (ref >60)
GFR calc non Af Amer: 12 mL/min — ABNORMAL LOW (ref >60)
Glucose, Bld: 130 mg/dL — ABNORMAL HIGH (ref 70–99)
Potassium: 4.8 mmol/L (ref 3.5–5.1)
Sodium: 131 mmol/L — ABNORMAL LOW (ref 135–145)

## 2019-03-18 LAB — CBC
HCT: 23.3 % — ABNORMAL LOW (ref 39.0–52.0)
Hemoglobin: 7.9 g/dL — ABNORMAL LOW (ref 13.0–17.0)
MCH: 33.5 pg (ref 26.0–34.0)
MCHC: 33.9 g/dL (ref 30.0–36.0)
MCV: 98.7 fL (ref 80.0–100.0)
Platelets: 159 10*3/uL (ref 150–400)
RBC: 2.36 MIL/uL — ABNORMAL LOW (ref 4.22–5.81)
RDW: 14.4 % (ref 11.5–15.5)
WBC: 7.6 10*3/uL (ref 4.0–10.5)
nRBC: 0 % (ref 0.0–0.2)

## 2019-03-18 LAB — GLUCOSE, CAPILLARY
Glucose-Capillary: 115 mg/dL — ABNORMAL HIGH (ref 70–99)
Glucose-Capillary: 114 mg/dL — ABNORMAL HIGH (ref 70–99)
Glucose-Capillary: 119 mg/dL — ABNORMAL HIGH (ref 70–99)
Glucose-Capillary: 142 mg/dL — ABNORMAL HIGH (ref 70–99)

## 2019-03-18 LAB — PROTIME-INR
INR: 1.1 (ref 0.8–1.2)
Prothrombin Time: 14.5 seconds (ref 11.4–15.2)

## 2019-03-18 MED ORDER — SENNOSIDES-DOCUSATE SODIUM 8.6-50 MG PO TABS
2.0000 | ORAL_TABLET | Freq: Two times a day (BID) | ORAL | Status: DC
  Administered 2019-03-18: 2 via ORAL
  Filled 2019-03-18 (×9): qty 2

## 2019-03-18 MED ORDER — FENTANYL CITRATE (PF) 100 MCG/2ML IJ SOLN
INTRAMUSCULAR | Status: AC
  Filled 2019-03-18: qty 2

## 2019-03-18 MED ORDER — POLYETHYLENE GLYCOL 3350 17 G PO PACK
17.0000 g | PACK | Freq: Every day | ORAL | Status: DC
  Administered 2019-03-18 – 2019-03-19 (×2): 17 g via ORAL
  Filled 2019-03-18 (×2): qty 1

## 2019-03-18 MED ORDER — GELATIN ABSORBABLE 12-7 MM EX MISC
CUTANEOUS | Status: AC
  Filled 2019-03-18: qty 1

## 2019-03-18 MED ORDER — MIDAZOLAM HCL 2 MG/2ML IJ SOLN
INTRAMUSCULAR | Status: AC
  Filled 2019-03-18: qty 2

## 2019-03-18 MED ORDER — FENTANYL CITRATE (PF) 100 MCG/2ML IJ SOLN
INTRAMUSCULAR | Status: AC | PRN
  Administered 2019-03-18: 25 ug via INTRAVENOUS

## 2019-03-18 MED ORDER — LIDOCAINE HCL (PF) 1 % IJ SOLN
INTRAMUSCULAR | Status: AC
  Filled 2019-03-18: qty 30

## 2019-03-18 MED ORDER — MIDAZOLAM HCL 2 MG/2ML IJ SOLN
INTRAMUSCULAR | Status: AC | PRN
  Administered 2019-03-18: 1 mg via INTRAVENOUS

## 2019-03-18 NOTE — Progress Notes (Signed)
Interventional Radiology Procedure Note  Procedure: US guided left kidney biopsy, medical renal.  Complications: None Recommendations:  - 3 hr bedrest - advance diet per primary -Ok to shower tomorrow - Do not submerge for 7 days - Routine care   Signed,  Dulcy Fanny. Earleen Newport, DO

## 2019-03-18 NOTE — Progress Notes (Addendum)
I have personally seen and examined this patient and agree with the assessment/plan as outlined below by Edwena Felty PA Student. Dorella Laster K.,MD 03/18/2019 11:23 AM  Admit: 03/13/2019 LOS: 5   Subjective:  Reports that he continues to feel better and complains of some numbness in his toes/legs.  Rash unchanged. No new complaints   12/10 0701 - 12/11 0700 In: 1080 [P.O.:1080] Out: 2800 [Urine:2800]  Filed Weights   03/15/19 2046 03/16/19 2043 03/18/19 0504  Weight: 92.5 kg 92.5 kg 92.3 kg    Scheduled Meds: . amLODipine  2.5 mg Oral Daily  . Chlorhexidine Gluconate Cloth  6 each Topical Daily  . darbepoetin (ARANESP) injection - NON-DIALYSIS  100 mcg Subcutaneous Q Thu-1800  . fentaNYL      . gelatin adsorbable      . insulin aspart  0-5 Units Subcutaneous QHS  . insulin aspart  0-9 Units Subcutaneous TID WC  . lidocaine (PF)      . midazolam      . sodium bicarbonate  650 mg Oral BID  . tamsulosin  0.4 mg Oral QPC supper  . thiamine  100 mg Oral Daily   Or  . thiamine  100 mg Intravenous Daily    PRN Meds:.acetaminophen, fentaNYL (SUBLIMAZE) injection, hydrALAZINE, ondansetron **OR** ondansetron (ZOFRAN) IV, traMADol  Current Labs:   Recent Labs  Lab 03/16/19 0228 03/17/19 0438 03/18/19 0332  NA 132* 131* 131*  K 4.5 4.3 4.8  CL 108 105 104  CO2 17* 20* 20*  GLUCOSE 104* 103* 130*  BUN 48* 45* 49*  CREATININE 4.29* 4.55* 4.71*  CALCIUM 7.6* 7.7* 8.2*  PHOS  --  3.2  --    Recent Labs  Lab 03/13/19 1745 03/14/19 1052 03/16/19 0228 03/17/19 0934 03/18/19 0332  WBC 8.3 7.5 7.4 7.9 7.6  NEUTROABS 6.8 5.5  --   --   --   HGB 9.7* 8.3* 8.1* 7.7* 7.9*  HCT 29.1* 25.5* 24.3* 22.8* 23.3*  MCV 98.6 100.8* 100.8* 99.1 98.7  PLT 220 180 159 150 159    Physical Exam:  Blood pressure 131/78, pulse 99, temperature 98 F (36.7 C), temperature source Oral, resp. rate 18, height 6\' 2"  (1.88 m), weight 92.3 kg, SpO2 99 %. Physical Exam  Constitutional: He is  oriented to person, place, and time. He appears well-developed and well-nourished. No distress.  Cardiovascular: Normal rate.  Pulmonary/Chest: Effort normal and breath sounds normal.  Abdominal: Soft. Bowel sounds are normal.  Neurological: He is alert and oriented to person, place, and time.  Skin: Skin is warm and dry. Rash noted.  Psychiatric: He has a normal mood and affect.    Assessment and Plan  1.   Acute Kidney Injury. Vasculitis appearing rash over lower extremities bilterally. Unknown baseline creatinine but 11/2017 labs show normal GFR and creatinine of 1.20. Labwork so far shows slightly decreased C3 concerning for an immune mediated process. Patient has continued to have good urine output. Serologies so far negative for ANCA vasculitis as well as negative for plasma cell dyscrasia. Renal biopsy requested due to lack of renal improvement on labs - Await results from today's renal biopsy--differential diagnoses at this time include a post-infectious GN, MPGN (both with low C3 states) or AIN. Will await reports (suspect this will be tomorrow/Monday)  2.   Hyponatremia.Appears chronic and likely related to his alcohol use, 11/2017 labwork showed normal sodium, however sodium remains consistently in low 130's.  3.   Non anion gap metabolic acidosis.LIkely CKD  related. Bicarb improved from 17 to 20 - Sodium Bicarb 650mg  BID   4.   Normocytic anemia. Possibly due to chronic alcohol use. Hgb 8.1 on most recent lab value. Iron studies showed normal iron and decreased TIBC.  ESA given yesterday, improvement of Hgb from 7.7 up to 7.9.  5.   Hypertension. Normotensive with management of Amlodipine 2.5mg   6.   Diabetes. A1C 6.2. Managed on insulin   Elmarie Shiley MD [facility]. Office # [phone] Pager # [phone] 11:08 AM

## 2019-03-18 NOTE — Progress Notes (Signed)
[facility]  PROGRESS NOTE  Louis Butler VZD:[id number] DOB: [birth year] DOA: 03/13/2019 PCP: Patient, [name] Admit date - 03/13/2019   Admitting Physician Shela Leff, MD  Outpatient Primary MD for the patient is Patient, [name]  LOS - 5 Brief Narrative   Louis Butler is a 63 y.o. year old male with medical history significant for  who presented on 03/13/2019 with HTN, diabetes, not followed by PCP for at least 2 years and on no home medications presented to [facility] due to lower extremity rash x2 to 4 weeks with bilateral foot swelling and pain right greater than left.  Admitted to [facility] with working diagnosis of AKI with creatinine of 5.24 (baseline 1.2, 11/2016), acute anemia ( hgb 9 , from previous baseline of 9) without bleeding and urinary retention and concern for UTI started on ceftriaxone. Patient was transferred to [facility] and admitted with working diagnosis of UTI, AKI with urinary retention.    Hospital course complicated by nonblanching purpuric rash concerning for vasculitic process in the setting of AKI.  Subjective  [name] today continues to have mild pain in both feet. Still making urine, no abdominal pain, No headache, No chest pain, No Nausea or vomiting, No new weakness, No Cough - SOB.   A & P    Nonoliguric AKI with urinary retention and bilateral hydronephrosis.  Creatinine continues to worsen slightly. Concern for ANCA negative vasculitis given Decrease in C3 and purpura.  Good urine output with Foley catheter in place. Suspect diabetes/HTN could also be contributing to to some element of chronicity.  Peak creatinine 5.24.  (Baseline 1.2 on 11/2016) -Nephrology recommends continuing to monitor function no current need for dialysis -s/p IR guided biopsy, 12/11 -Started Flomax here, continue closely monitor anticipate discontinue Foley next 24 to 48 hours  Nonblanching, purpuric rash.  Most concerning for vasculitic process  in the setting of AKI, differential includes postinfectious GN, MPGN, or AIN.  C3 slightly decreased, hep C/anti-GBM/hep B/HIV/ANA unremarkable, -s/p IR guided biopsy, on 12/11   Non- Anion gap metabolic acidosis in the setting of AKI due to above, improving after starting sodium bicarb - continue sodium bicarbonate 650 mg TID -monitor BMP  Normocytic anemia, stable likely due to kidney disease, likely has some element of advanced chronic in the setting of diabetic nephropathy.  No signs of active bleeding here.iron panel with wnl iron and decreased TIBC. MCV slightly up to 100 today,  --ESA per nephrology -Monitor CBC  Mild hyponatremia, stable. Could be combination of beer potomania from chronic alcohol as well as some increased volume from kidney dysfunction.  -monitor BMP  Diabetes mellitus, A1c at goal.   -Closely monitor CBG with sliding scale insulin as needed, will likely need Metformin on discharge, monitor.  Hypertension, at goal. -Continue amlodipine 2.5 mg(started here)  Reported epistaxis.  No recurrent episodes here.hgb low as mentioned above, platelets unremarkable. -Continue to closely monitor  Tobacco/alcohol dependence.  Emphasized cessation of both.  Reports binge drinking on the weekends patient agreeable, no current signs of withdrawal. -Monitor for alcohol withdrawal on CIWA protocol -Continue thiamine supplementation  Family Communication  : None at bedside  Code Status : Full code  Disposition Plan  : Close monitoring kidney function, metabolic acidosis, f/u renal biopsy results  Consults  : Nephrology  Procedures  : None  DVT Prophylaxis  :  SCDs   Lab Results  Component Value Date   PLT 159 03/18/2019  Diet :  Diet Order            Diet heart healthy/carb modified Room service appropriate? Yes; Fluid consistency: Thin  Diet effective now               Inpatient Medications Scheduled Meds:  amLODipine  2.5 mg Oral Daily    Chlorhexidine Gluconate Cloth  6 each Topical Daily   darbepoetin (ARANESP) injection - NON-DIALYSIS  100 mcg Subcutaneous Q Thu-1800   fentaNYL       gelatin adsorbable       insulin aspart  0-5 Units Subcutaneous QHS   insulin aspart  0-9 Units Subcutaneous TID WC   lidocaine (PF)       midazolam       polyethylene glycol  17 g Oral Daily   senna-docusate  2 tablet Oral BID   sodium bicarbonate  650 mg Oral BID   tamsulosin  0.4 mg Oral QPC supper   thiamine  100 mg Oral Daily   Or   thiamine  100 mg Intravenous Daily   Continuous Infusions: PRN Meds:.acetaminophen, fentaNYL (SUBLIMAZE) injection, hydrALAZINE, ondansetron **OR** ondansetron (ZOFRAN) IV, traMADol  Antibiotics  :   Anti-infectives (From admission, onward)   Start     Dose/Rate Route Frequency Ordered Stop   03/14/19 1900  cefTRIAXone (ROCEPHIN) 1 g in sodium chloride 0.9 % 100 mL IVPB     1 g 200 mL/hr over 30 Minutes Intravenous  Once 03/14/19 1001 03/14/19 2235   03/13/19 1945  cefTRIAXone (ROCEPHIN) 1 g in sodium chloride 0.9 % 100 mL IVPB     1 g 200 mL/hr over 30 Minutes Intravenous  Once 03/13/19 1932 03/13/19 2016       Objective   Vitals:   03/18/19 0925 03/18/19 0930 03/18/19 0935 03/18/19 1000  BP: (!) 149/80 133/72 139/82 131/78  Pulse: (!) 104 100 (!) 103 99  Resp: 14 17 16 18   Temp:    98 F (36.7 C)  TempSrc:    Oral  SpO2: 100% 100% 100% 99%  Weight:      Height:        SpO2: 99 % O2 Flow Rate (L/min): 2 L/min  Wt Readings from Last 3 Encounters:  03/18/19 92.3 kg  11/28/17 90.3 kg  02/06/17 90.7 kg     Intake/Output Summary (Last 24 hours) at 03/18/2019 1530 Last data filed at 03/18/2019 1328 Gross per 24 hour  Intake 240 ml  Output 2500 ml  Net -2260 ml    Physical Exam:  Awake Alert, Oriented X 3, Normal affect No new F.N deficits,  [place].AT, No JVD Symmetrical Chest wall movement, Good air movement bilaterally, CTAB RRR,No Gallops,Rubs or new  Murmurs,  +ve B.Sounds, Abd Soft, No tenderness, No organomegaly appreciated, No rebound, guarding or rigidity. Lower extremity rash unchanged   Left lateral foot, 12/8  Left medial foot, 12/8  Right medial foot, 12/8  Right lateral foot, 12/8        I have personally reviewed the following:   Data Reviewed:  CBC Recent Labs  Lab 03/13/19 1745 03/14/19 1052 03/15/19 0555 03/16/19 0228 03/17/19 0934 03/18/19 0332  WBC 8.3 7.5 7.4 7.4 7.9 7.6  HGB 9.7* 8.3* 8.8* 8.1* 7.7* 7.9*  HCT 29.1* 25.5* 26.2* 24.3* 22.8* 23.3*  PLT 220 180 184 159 150 159  MCV 98.6 100.8* 99.2 100.8* 99.1 98.7  MCH 32.9 32.8 33.3 33.6 33.5 33.5  MCHC 33.3 32.5 33.6 33.3 33.8 33.9  RDW 14.6  14.8 14.6 14.6 14.6 14.4  LYMPHSABS 0.9 1.2  --   --   --   --   MONOABS 0.4 0.4  --   --   --   --   EOSABS 0.1 0.4  --   --   --   --   BASOSABS 0.0 0.0  --   --   --   --     Chemistries  Recent Labs  Lab 03/13/19 1745 03/14/19 1824 03/15/19 0555 03/16/19 0228 03/17/19 0438 03/18/19 0332  NA 131* 130* 132* 132* 131* 131*  K 4.6 4.9 4.5 4.5 4.3 4.8  CL 104 106 107 108 105 104  CO2 17* 18* 18* 17* 20* 20*  GLUCOSE 144* 152* 129* 104* 103* 130*  BUN 53* 51* 50* 48* 45* 49*  CREATININE 5.24* 4.80* 4.80* 4.29* 4.55* 4.71*  CALCIUM 8.4* 8.1* 8.1* 7.6* 7.7* 8.2*  AST 20  --   --   --   --   --   ALT 13  --   --   --   --   --   ALKPHOS 98  --   --   --   --   --   BILITOT 0.8  --   --   --   --   --    ------------------------------------------------------------------------------------------------------------------ No results for input(s): CHOL, HDL, LDLCALC, TRIG, CHOLHDL, LDLDIRECT in the last 72 hours.  Lab Results  Component Value Date   HGBA1C 6.2 (H) 03/14/2019   ------------------------------------------------------------------------------------------------------------------ No results for input(s): TSH, T4TOTAL, T3FREE, THYROIDAB in the last 72 hours.  Invalid input(s):  FREET3 ------------------------------------------------------------------------------------------------------------------ No results for input(s): VITAMINB12, FOLATE, FERRITIN, TIBC, IRON, RETICCTPCT in the last 72 hours.  Coagulation profile Recent Labs  Lab 03/13/19 1745 03/15/19 0555 03/18/19 0332  INR 1.1 1.2 1.1    No results for input(s): DDIMER in the last 72 hours.  Cardiac Enzymes No results for input(s): CKMB, TROPONINI, MYOGLOBIN in the last 168 hours.  Invalid input(s): CK ------------------------------------------------------------------------------------------------------------------ No results found for: BNP  Micro Results Recent Results (from the past 240 hour(s))  SARS CORONAVIRUS 2 (TAT 6-24 HRS) Nasopharyngeal Nasopharyngeal Swab     Status: None   Collection Time: 03/13/19  7:11 PM   Specimen: Nasopharyngeal Swab  Result Value Ref Range Status   SARS Coronavirus 2 NEGATIVE NEGATIVE Final    Comment: (NOTE) SARS-CoV-2 target nucleic acids are NOT DETECTED. The SARS-CoV-2 RNA is generally detectable in upper and lower respiratory specimens during the acute phase of infection. Negative results do not preclude SARS-CoV-2 infection, do not rule out co-infections with other pathogens, and should not be used as the sole basis for treatment or other patient management decisions. Negative results must be combined with clinical observations, patient history, and epidemiological information. The expected result is Negative. Fact Sheet for Patients: SugarRoll.be Fact Sheet for Healthcare Providers: https://www.woods-mathews.com/ This test is not yet approved or cleared by the [place] FDA and  has been authorized for detection and/or diagnosis of SARS-CoV-2 by FDA under an Emergency Use Authorization (EUA). This EUA will remain  in effect (meaning this test can be used) for the duration of the COVID-19 declaration  under Section 56 4(b)(1) of the Act, 21 U.S.C. section 360bbb-3(b)(1), unless the authorization is terminated or revoked sooner. Performed at [facility], [place] [address] [place], [place] [address]     Radiology Reports CT ABDOMEN PELVIS WO CONTRAST  Result Date: 03/14/2019 CLINICAL DATA:  Renal failure.  Anemia.  Microscopic hematuria. EXAM: CT ABDOMEN AND PELVIS WITHOUT CONTRAST TECHNIQUE: Multidetector CT imaging of the abdomen and pelvis was performed following the standard protocol without IV contrast. COMPARISON:  03/13/2019 renal ultrasound.  No prior CT. FINDINGS: Lower chest: Emphysema. Normal heart size without pericardial or pleural effusion. Multivessel coronary artery atherosclerosis. Hepatobiliary: Normal noncontrast appearance of the liver. Small gallstones without acute cholecystitis or biliary duct dilatation. Pancreas: Normal, without mass or ductal dilatation. Spleen: Normal in size, without focal abnormality. Adrenals/Urinary Tract: Normal adrenal glands. Mild renal cortical thinning bilaterally. Perirenal interstitial thickening. Exophytic interpolar right renal 3.8 cm lesion measures 48 HU on 31/3. No renal calculi. Mild bilateral hydroureteronephrosis. Hydroureter is followed to the level of the urinary bladder, without obstructive mass. There is a nonobstructive punctate mid right ureteric stone on 59/3 and sagittal image 85. Foley catheter within the urinary bladder. The bladder is thick walled with mild surrounding edema. Stomach/Bowel: Normal stomach, without wall thickening. Normal colon, appendix, and terminal ileum. Normal small bowel. Vascular/Lymphatic: Advanced aortic and branch vessel atherosclerosis. Mildly prominent abdominal retroperitoneal nodes are not pathologic by size criteria and likely reactive. Prominent bilateral inguinal nodes are also identified and favored to be reactive. Reproductive: Normal prostate. Other: No significant free fluid.  Musculoskeletal: Lumbosacral spondylosis. Minimal wedging of the T11 superior endplate. IMPRESSION: 1. Bilateral mild hydroureteronephrosis, followed to the level of the urinary bladder. The bladder is thick walled with surrounding pericystic edema in the setting of a Foley catheter. Considerations include cystitis and/or bladder outlet obstruction. Perirenal interstitial thickening is nonspecific but likely related to the clinical history of renal insufficiency. 2. 3 mm mid right ureteric nonobstructive stone. 3. Right renal mass is technically indeterminate on noncontrast exam. Most consistent with a cyst on yesterday's ultrasound. Consider surveillance with renal ultrasound at 6 months and attention to the right renal lesion. 4. Coronary artery atherosclerosis. Aortic Atherosclerosis (ICD10-I70.0). Emphysema (ICD10-J43.9). 5. Cholelithiasis. Electronically Signed   By: Abigail Miyamoto M.D.   On: 03/14/2019 19:44   US Renal  Result Date: 03/13/2019 CLINICAL DATA:  63 year old male with acute renal failure. History of diabetes. EXAM: RENAL / URINARY TRACT ULTRASOUND COMPLETE COMPARISON:  None. FINDINGS: Evaluation is limited as the patient was not able to cooperate with exam and due to portable technique. Right Kidney: Renal measurements: 12.6 x 4.4 x 4.1 cm = volume: 117 mL. There is moderate parenchyma atrophy. There is increased renal parenchymal echogenicity. There is a 3.3 x 2.4 x 3.1 cm upper pole cyst. There is moderate right hydronephrosis. No shadowing stone. Left Kidney: Renal measurements: 12.8 x 4.8 x 5.1 cm = volume: 164 ML. There is mild parenchyma atrophy and cortical thinning. There is increased renal parenchymal echogenicity. Mild-to-moderate hydronephrosis. No shadowing stone. Bladder: Not well visualized. Other: None. IMPRESSION: 1. Increased renal parenchyma echogenicity in keeping with chronic kidney disease. 2. Bilateral hydronephrosis, right greater left. No shadowing stone. Electronically  Signed   By: Anner Crete M.D.   On: 03/13/2019 20:15   US Venous Img Lower Bilateral  Result Date: 03/13/2019 CLINICAL DATA:  Leg swelling right greater than left, calf pain and bilateral foot pain EXAM: BILATERAL LOWER EXTREMITY VENOUS DOPPLER ULTRASOUND TECHNIQUE: Gray-scale sonography with graded compression, as well as color Doppler and duplex ultrasound were performed to evaluate the lower extremity deep venous systems from the level of the common femoral vein and including the common femoral, femoral, profunda femoral, popliteal and calf veins including the posterior tibial, peroneal and gastrocnemius veins when visible. The superficial great saphenous  vein was also interrogated. Spectral Doppler was utilized to evaluate flow at rest and with distal augmentation maneuvers in the common femoral, femoral and popliteal veins. COMPARISON:  None FINDINGS: RIGHT LOWER EXTREMITY Common Femoral Vein: No evidence of thrombus. Normal compressibility, respiratory phasicity and response to augmentation. Saphenofemoral Junction: No evidence of thrombus. Normal compressibility and flow on color Doppler imaging. Profunda Femoral Vein: No evidence of thrombus. Normal compressibility and flow on color Doppler imaging. Femoral Vein: No evidence of thrombus. Normal compressibility, respiratory phasicity and response to augmentation. Popliteal Vein: No evidence of thrombus. Normal compressibility, respiratory phasicity and response to augmentation. Calf Veins: No evidence of thrombus. Normal compressibility and flow on color Doppler imaging. Superficial Great Saphenous Vein: No evidence of thrombus. Normal compressibility. Venous Reflux:  None. Other Findings:  None. LEFT LOWER EXTREMITY Common Femoral Vein: No evidence of thrombus. Normal compressibility, respiratory phasicity and response to augmentation. Saphenofemoral Junction: No evidence of thrombus. Normal compressibility and flow on color Doppler imaging.  Profunda Femoral Vein: No evidence of thrombus. Normal compressibility and flow on color Doppler imaging. Femoral Vein: No evidence of thrombus. Normal compressibility, respiratory phasicity and response to augmentation. Popliteal Vein: No evidence of thrombus. Normal compressibility, respiratory phasicity and response to augmentation. Calf Veins: No evidence of thrombus. Normal compressibility and flow on color Doppler imaging. Superficial Great Saphenous Vein: No evidence of thrombus. Normal compressibility. Venous Reflux:  None. Other Findings: Atheromatous plaque throughout visualized arterial vessels with abundant eccentric soft plaque in the right popliteal artery. IMPRESSION: No evidence of deep venous thrombosis in either lower extremity. Signs of arterial vascular plaque with more pronounced changes incidentally noted in the right popliteal artery. Electronically Signed   By: Zetta Bills M.D.   On: 03/13/2019 19:03   DG Chest Port 1 View  Result Date: 03/13/2019 CLINICAL DATA:  Renal failure.  Hypertension and smoking. EXAM: PORTABLE CHEST 1 VIEW COMPARISON:  None. FINDINGS: The heart size and mediastinal contours are within normal limits. Both lungs are clear. The visualized skeletal structures are unremarkable. IMPRESSION: No active disease. Electronically Signed   By: Constance Holster M.D.   On: 03/13/2019 20:36   VAS [place] ABI WITH/WO TBI  Result Date: 03/15/2019 LOWER EXTREMITY DOPPLER STUDY  Performing Technologist: Lita Mains RDMS, RVT  Examination Guidelines: A complete evaluation includes at minimum, Doppler waveform signals and systolic blood pressure reading at the level of bilateral brachial, anterior tibial, and posterior tibial arteries, when vessel segments are accessible. Bilateral testing is considered an integral part of a complete examination. Photoelectric Plethysmograph (PPG) waveforms and toe systolic pressure readings are included as required and additional duplex testing  as needed. Limited examinations for reoccurring indications may be performed as noted.  ABI Findings: +--------+------------------+-----+---------+--------+  Right    Rt Pressure (mmHg) Index Waveform  Comment   +--------+------------------+-----+---------+--------+  Brachial 181                      triphasic           +--------+------------------+-----+---------+--------+  PTA      219                1.21  triphasic           +--------+------------------+-----+---------+--------+  DP       222                1.23  triphasic           +--------+------------------+-----+---------+--------+ +--------+------------------+-----+---------+-------+  Left  Lt Pressure (mmHg) Index Waveform  Comment  +--------+------------------+-----+---------+-------+  Brachial 176                      triphasic          +--------+------------------+-----+---------+-------+  PTA      220                1.22  biphasic           +--------+------------------+-----+---------+-------+  DP       217                1.20  triphasic          +--------+------------------+-----+---------+-------+ +-------+-----------+-----------+------------+------------+  ABI/TBI Today's ABI Today's TBI Previous ABI Previous TBI  +-------+-----------+-----------+------------+------------+  Right   1.23                                               +-------+-----------+-----------+------------+------------+  Left    1.22                                               +-------+-----------+-----------+------------+------------+  Summary: Right: Resting right ankle-brachial index is within normal range. No evidence of significant right lower extremity arterial disease. Left: Resting left ankle-brachial index is within normal range. No evidence of significant left lower extremity arterial disease.  *See table(s) above for measurements and observations.  Electronically signed by Harold Barban MD on 03/15/2019 at 5:23:12 PM.   Final    US BIOPSY (KIDNEY)  Result  Date: 03/18/2019 INDICATION: 63 year old male with a history renal dysfunction referred for medical renal biopsy EXAM: IMAGE GUIDED MEDICAL RENAL BIOPSY MEDICATIONS: None. ANESTHESIA/SEDATION: Moderate (conscious) sedation was employed during this procedure. A total of Versed 1.0 mg and Fentanyl 25 mcg was administered intravenously. Moderate Sedation Time: 13 minutes. The patient's level of consciousness and vital signs were monitored continuously by radiology nursing throughout the procedure under my direct supervision. FLUOROSCOPY TIME:  None COMPLICATIONS: None PROCEDURE: Informed written consent was obtained from the patient after a thorough discussion of the procedural risks, benefits and alternatives. All questions were addressed. Maximal Sterile Barrier Technique was utilized including caps, mask, sterile gowns, sterile gloves, sterile drape, hand hygiene and skin antiseptic. A timeout was performed prior to the initiation of the procedure. Patient was positioned prone position on the gantry table. Images were stored sent to PACs. Once the patient is prepped and draped in the usual sterile fashion, the skin and subcutaneous tissues overlying the left kidney were generously infiltrated 1% lidocaine for local anesthesia. Using ultrasound guidance, a 15 gauge guide needle was advanced into the lower cortex of the left kidney. Once we confirmed location of the needle tip, 2 separate 16 gauge core biopsy were achieved. Two Gel-Foam pledgets were infused with a small amount of saline. The needle was removed. Final images were stored. The patient tolerated the procedure well and remained hemodynamically stable throughout. No complications were encountered and no significant blood loss encountered. IMPRESSION: Status post ultrasound-guided medical renal biopsy. Signed, Dulcy Fanny. Dellia Nims, RPVI [facility] Radiology Electronically Signed   By: Corrie Mckusick D.O.    On: 03/18/2019 09:57     Time Spent in minutes  [place] M.D on 03/18/2019 at 3:30 PM  To page go to [email or web address] - password [facility]

## 2019-03-19 DIAGNOSIS — K5900 Constipation, unspecified: Secondary | ICD-10-CM

## 2019-03-19 LAB — BASIC METABOLIC PANEL
Anion gap: 9 (ref 5–15)
BUN: 52 mg/dL — ABNORMAL HIGH (ref 8–23)
CO2: 21 mmol/L — ABNORMAL LOW (ref 22–32)
Calcium: 8 mg/dL — ABNORMAL LOW (ref 8.9–10.3)
Chloride: 102 mmol/L (ref 98–111)
Creatinine, Ser: 4.54 mg/dL — ABNORMAL HIGH (ref 0.61–1.24)
GFR calc Af Amer: 15 mL/min — ABNORMAL LOW (ref >60)
GFR calc non Af Amer: 13 mL/min — ABNORMAL LOW (ref >60)
Glucose, Bld: 117 mg/dL — ABNORMAL HIGH (ref 70–99)
Potassium: 4.4 mmol/L (ref 3.5–5.1)
Sodium: 132 mmol/L — ABNORMAL LOW (ref 135–145)

## 2019-03-19 LAB — CBC
HCT: 24.6 % — ABNORMAL LOW (ref 39.0–52.0)
Hemoglobin: 8.2 g/dL — ABNORMAL LOW (ref 13.0–17.0)
MCH: 33.3 pg (ref 26.0–34.0)
MCHC: 33.3 g/dL (ref 30.0–36.0)
MCV: 100 fL (ref 80.0–100.0)
Platelets: 191 10*3/uL (ref 150–400)
RBC: 2.46 MIL/uL — ABNORMAL LOW (ref 4.22–5.81)
RDW: 14.6 % (ref 11.5–15.5)
WBC: 7.3 10*3/uL (ref 4.0–10.5)
nRBC: 0 % (ref 0.0–0.2)

## 2019-03-19 LAB — GLUCOSE, CAPILLARY
Glucose-Capillary: 121 mg/dL — ABNORMAL HIGH (ref 70–99)
Glucose-Capillary: 118 mg/dL — ABNORMAL HIGH (ref 70–99)
Glucose-Capillary: 160 mg/dL — ABNORMAL HIGH (ref 70–99)
Glucose-Capillary: 136 mg/dL — ABNORMAL HIGH (ref 70–99)

## 2019-03-19 MED ORDER — POLYETHYLENE GLYCOL 3350 17 G PO PACK
17.0000 g | PACK | Freq: Two times a day (BID) | ORAL | Status: DC
  Administered 2019-03-21 (×2): 17 g via ORAL
  Filled 2019-03-19 (×6): qty 1

## 2019-03-19 NOTE — Plan of Care (Signed)
  Problem: Nutrition: Goal: Adequate nutrition will be maintained Outcome: Progressing Note: Pt has been able to tolerate his diet during my care.    Problem: Elimination: Goal: Will not experience complications related to urinary retention Outcome: Progressing Note: Pt has order to d/c foley during my care. Will d/c and monitor output,   Problem: Pain Managment: Goal: General experience of comfort will improve Outcome: Progressing Note: Pt complained of 8/10 pain in bilateral feet and received PRN Tylenol. At the time of reassessment, the pt was asleep. Will continue to monitor during my care.

## 2019-03-19 NOTE — Progress Notes (Signed)
Patient ID: JONMARC BODKIN, male   DOB: 05-31-1955, 63 y.o.   MRN: [id number] [facility] Progress Note   Assessment/ Plan:   1. 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  Renal biopsy undertaken yesterday.  He remains nonoliguric and with stable renal function overnight and without acute dialysis needs.  Hemoglobin A1c was 6.2% and screening for plasma cell dyscrasia negative.  Discontinue Foley catheter today and continue strict input/output monitoring. 2.  Hyponatremia: Euvolemic, appears to be likely chronic (from alcohol use-beer potomania) and may be aggravated by recent acute kidney injury.  Without indications for intervention at this point. 3.  Anion gap metabolic acidosis: Mild, continue sodium bicarbonate 4.  Hypertension: Blood pressures currently appear to be under good control on amlodipine monotherapy.  Subjective:   Reports to be feeling fair, denies any chest pain or shortness of breath.  Would like some paperwork done for his work.   Objective:   BP 115/62 (BP Location: Right Arm)   Pulse 75   Temp 98.5 F (36.9 C) (Oral)   Resp 18   Ht 6\' 2"  (1.88 m)   Wt 92.3 kg   SpO2 96%   BMI 26.13 kg/m   Intake/Output Summary (Last 24 hours) at 03/19/2019 1042 Last data filed at 03/19/2019 0900 Gross per 24 hour  Intake 1860 ml  Output 1620 ml  Net 240 ml   Weight change:   Physical Exam: Gen: Comfortably resting in bed CVS: Pulse regular rhythm, normal rate, S1 and S2 normal Resp: Clear to auscultation, no rales/rhonchi Abd: Soft, flat, nontender Ext: No lower extremity edema, evolving skin rash noted  Imaging: US BIOPSY (KIDNEY)  Result Date: 03/18/2019 INDICATION: 63 year old male with  a history renal dysfunction referred for medical renal biopsy EXAM: IMAGE GUIDED MEDICAL RENAL BIOPSY MEDICATIONS: None. ANESTHESIA/SEDATION: Moderate (conscious) sedation was employed during this procedure. A total of Versed 1.0 mg and Fentanyl 25 mcg was administered intravenously. Moderate Sedation Time: 13 minutes. The patient's level of consciousness and vital signs were monitored continuously by radiology nursing throughout the procedure under my direct supervision. FLUOROSCOPY TIME:  None COMPLICATIONS: None PROCEDURE: Informed written consent was obtained from the patient after a thorough discussion of the procedural risks, benefits and alternatives. All questions were addressed. Maximal Sterile Barrier Technique was utilized including caps, mask, sterile gowns, sterile gloves, sterile drape, hand hygiene and skin antiseptic. A timeout was performed prior to the initiation of the procedure. Patient was positioned prone position on the gantry table. Images were stored sent to PACs. Once the patient is prepped and draped in the usual sterile fashion, the skin and subcutaneous tissues overlying the left kidney were generously infiltrated 1% lidocaine for local anesthesia. Using ultrasound guidance, a 15 gauge guide needle was advanced into the lower cortex of the left kidney. Once we confirmed location of the needle tip, 2 separate 16 gauge core biopsy were achieved. Two Gel-Foam pledgets were infused with a small amount of saline. The needle was removed. Final images were stored. The patient tolerated the procedure well and remained hemodynamically stable throughout. No complications were encountered and no significant blood loss encountered. IMPRESSION: Status post ultrasound-guided medical renal biopsy. Signed, Dulcy Fanny. Dellia Nims, [facility] Electronically Signed  By: Corrie Mckusick D.O.   On: 03/18/2019 09:57    Labs: BMET Recent Labs   Lab 03/14/19 1052 03/14/19 1824 03/15/19 0555 03/16/19 0228 03/17/19 0438 03/18/19 0332 03/19/19 0538  NA 132* 130* 132* 132* 131* 131* 132*  K 4.5 4.9 4.5 4.5 4.3 4.8 4.4  CL 106 106 107 108 105 104 102  CO2 18* 18* 18* 17* 20* 20* 21*  GLUCOSE 214* 152* 129* 104* 103* 130* 117*  BUN 56* 51* 50* 48* 45* 49* 52*  CREATININE 4.93* 4.80* 4.80* 4.29* 4.55* 4.71* 4.54*  CALCIUM 7.8* 8.1* 8.1* 7.6* 7.7* 8.2* 8.0*  PHOS  --   --   --   --  3.2  --   --    CBC Recent Labs  Lab 03/13/19 1745 03/14/19 1052 03/16/19 0228 03/17/19 0934 03/18/19 0332 03/19/19 0538  WBC 8.3 7.5 7.4 7.9 7.6 7.3  NEUTROABS 6.8 5.5  --   --   --   --   HGB 9.7* 8.3* 8.1* 7.7* 7.9* 8.2*  HCT 29.1* 25.5* 24.3* 22.8* 23.3* 24.6*  MCV 98.6 100.8* 100.8* 99.1 98.7 100.0  PLT 220 180 159 150 159 191    Medications:    . amLODipine  2.5 mg Oral Daily  . Chlorhexidine Gluconate Cloth  6 each Topical Daily  . darbepoetin (ARANESP) injection - NON-DIALYSIS  100 mcg Subcutaneous Q Thu-1800  . insulin aspart  0-5 Units Subcutaneous QHS  . insulin aspart  0-9 Units Subcutaneous TID WC  . polyethylene glycol  17 g Oral Daily  . senna-docusate  2 tablet Oral BID  . sodium bicarbonate  650 mg Oral BID  . tamsulosin  0.4 mg Oral QPC supper  . thiamine  100 mg Oral Daily   Or  . thiamine  100 mg Intravenous Daily   Elmarie Shiley, MD 03/19/2019, 10:42 AM

## 2019-03-19 NOTE — Progress Notes (Signed)
[facility]  PROGRESS NOTE  Louis Butler VVO:[id number] DOB: May 05, 1955 DOA: 03/13/2019 PCP: Patient, [name] Admit date - 03/13/2019   Admitting Physician Shela Leff, MD  Outpatient Primary MD for the patient is Patient, [name]  LOS - 6 Brief Narrative   Louis Butler is a 63 y.o. year old male with medical history significant for  who presented on 03/13/2019 with HTN, diabetes, not followed by PCP for at least 2 years and on no home medications presented to [facility] due to lower extremity rash x2 to 4 weeks with bilateral foot swelling and pain right greater than left.  Admitted to [facility] with working diagnosis of AKI with creatinine of 5.24 (baseline 1.2, 11/2016), acute anemia ( hgb 9 , from previous baseline of 9) without bleeding and urinary retention and concern for UTI started on ceftriaxone. Patient was transferred to [facility] and admitted with working diagnosis of UTI, AKI with urinary retention.    Hospital course complicated by nonblanching purpuric rash concerning for vasculitic process in the setting of AKI.  Subjective  Louis Butler today continues to have mild pain in both feet. Still making urine, no abdominal pain, No headache, No chest pain, No Nausea or vomiting, No new weakness, No Cough - SOB.   A & P    Nonoliguric AKI with urinary retention and bilateral hydronephrosis, stable.  Creatinine stable. Concern for ANCA negative vasculitis given Decrease in C3 and purpura.  Good urine output with. Suspect diabetes/HTN could also be contributing to to some element of chronicity.  Peak creatinine 5.24.  (Baseline 1.2 on 11/2016) -Nephrology recommends continuing to monitor function no current need for dialysis --d/c foley and closely monitor output -s/p IR guided biopsy, 12/11 -Started Flomax here, continue closely monitor anticipate discontinue Foley next 24 to 48 hours  Nonblanching, purpuric rash,stable.  Most concerning for  vasculitic process in the setting of AKI, differential includes postinfectious GN, MPGN, or AIN.  C3 slightly decreased, hep C/anti-GBM/hep B/HIV/ANA unremarkable, -s/p IR guided biopsy, on 12/11   Non- Anion gap metabolic acidosis, improving in the setting of AKI due to above, improving after starting sodium bicarb - continue sodium bicarbonate 650 mg TID -monitor BMP  Normocytic anemia, stable likely due to kidney disease, likely has some element of advanced chronic in the setting of diabetic nephropathy.  No signs of active bleeding here.iron panel with wnl iron and decreased TIBC.  --ESA per nephrology -Monitor CBC  Mild hyponatremia, stable. Could be combination of beer potomania from chronic alcohol as well as some increased volume from kidney dysfunction.  -monitor BMP  Diabetes mellitus, A1c at goal.   -Closely monitor CBG with sliding scale insulin as needed, will likely need Metformin on discharge, monitor.  Hypertension, at goal. -Continue amlodipine 2.5 mg(started here)  Reported epistaxis.  No recurrent episodes here.hgb low as mentioned above, platelets unremarkable. -Continue to closely monitor  Constipation. No nausea or emesis or abdominal pain. NO BM in a week. Normal appetite --BID senna docusate --increase to BID miralax --monitor  Tobacco/alcohol dependence.  Emphasized cessation of both.  Reports binge drinking on the weekends patient agreeable, no current signs of withdrawal. -Monitor for alcohol withdrawal on CIWA protocol -Continue thiamine supplementation  Family Communication  : None at bedside  Code Status : Full code  Disposition Plan  : Close monitoring kidney function, metabolic acidosis, f/u renal biopsy results  Consults  : Nephrology  Procedures  : None  DVT Prophylaxis  :  SCDs   Lab Results  Component Value Date   PLT 191 03/19/2019    Diet :  Diet Order            Diet heart healthy/carb modified Room service appropriate? Yes;  Fluid consistency: Thin  Diet effective now               Inpatient Medications Scheduled Meds: . amLODipine  2.5 mg Oral Daily  . Chlorhexidine Gluconate Cloth  6 each Topical Daily  . darbepoetin (ARANESP) injection - NON-DIALYSIS  100 mcg Subcutaneous Q Thu-1800  . insulin aspart  0-5 Units Subcutaneous QHS  . insulin aspart  0-9 Units Subcutaneous TID WC  . polyethylene glycol  17 g Oral Daily  . senna-docusate  2 tablet Oral BID  . sodium bicarbonate  650 mg Oral BID  . tamsulosin  0.4 mg Oral QPC supper  . thiamine  100 mg Oral Daily   Or  . thiamine  100 mg Intravenous Daily   Continuous Infusions: PRN Meds:.acetaminophen, fentaNYL (SUBLIMAZE) injection, hydrALAZINE, ondansetron **OR** ondansetron (ZOFRAN) IV, traMADol  Antibiotics  :   Anti-infectives (From admission, onward)   Start     Dose/Rate Route Frequency Ordered Stop   03/14/19 1900  cefTRIAXone (ROCEPHIN) 1 g in sodium chloride 0.9 % 100 mL IVPB     1 g 200 mL/hr over 30 Minutes Intravenous  Once 03/14/19 1001 03/14/19 2235   03/13/19 1945  cefTRIAXone (ROCEPHIN) 1 g in sodium chloride 0.9 % 100 mL IVPB     1 g 200 mL/hr over 30 Minutes Intravenous  Once 03/13/19 1932 03/13/19 2016       Objective   Vitals:   03/18/19 1530 03/18/19 2100 03/19/19 0419 03/19/19 0900  BP: (!) 149/70 (!) 149/78 138/76 115/62  Pulse:  88 87 75  Resp: 16 16 18 18   Temp: 99.2 F (37.3 C) 98.9 F (37.2 C) 98.7 F (37.1 C) 98.5 F (36.9 C)  TempSrc: Oral Oral Oral Oral  SpO2: 100% 98% 97% 96%  Weight:      Height:        SpO2: 96 % O2 Flow Rate (L/min): 2 L/min  Wt Readings from Last 3 Encounters:  03/18/19 92.3 kg  11/28/17 90.3 kg  02/06/17 90.7 kg     Intake/Output Summary (Last 24 hours) at 03/19/2019 1322 Last data filed at 03/19/2019 1300 Gross per 24 hour  Intake 2160 ml  Output 1620 ml  Net 540 ml    Physical Exam:  Awake Alert, Oriented X 3, Normal affect No new F.N deficits,  [place].AT, No  JVD Symmetrical Chest wall movement, Good air movement bilaterally, CTAB RRR,No Gallops,Rubs or new Murmurs,  +ve B.Sounds, Abd Soft, No tenderness, No organomegaly appreciated, No rebound, guarding or rigidity. Lower extremity rash unchanged   Left lateral foot, 12/8  Left medial foot, 12/8  Right medial foot, 12/8  Right lateral foot, 12/8        I have personally reviewed the following:   Data Reviewed:  CBC Recent Labs  Lab 03/13/19 1745 03/14/19 1052 03/15/19 0555 03/16/19 0228 03/17/19 0934 03/18/19 0332 03/19/19 0538  WBC 8.3 7.5 7.4 7.4 7.9 7.6 7.3  HGB 9.7* 8.3* 8.8* 8.1* 7.7* 7.9* 8.2*  HCT 29.1* 25.5* 26.2* 24.3* 22.8* 23.3* 24.6*  PLT 220 180 184 159 150 159 191  MCV 98.6 100.8* 99.2 100.8* 99.1 98.7 100.0  MCH 32.9 32.8 33.3 33.6 33.5 33.5 33.3  MCHC 33.3 32.5 33.6 33.3 33.8 33.9  33.3  RDW 14.6 14.8 14.6 14.6 14.6 14.4 14.6  LYMPHSABS 0.9 1.2  --   --   --   --   --   MONOABS 0.4 0.4  --   --   --   --   --   EOSABS 0.1 0.4  --   --   --   --   --   BASOSABS 0.0 0.0  --   --   --   --   --     Chemistries  Recent Labs  Lab 03/13/19 1745 03/15/19 0555 03/16/19 0228 03/17/19 0438 03/18/19 0332 03/19/19 0538  NA 131* 132* 132* 131* 131* 132*  K 4.6 4.5 4.5 4.3 4.8 4.4  CL 104 107 108 105 104 102  CO2 17* 18* 17* 20* 20* 21*  GLUCOSE 144* 129* 104* 103* 130* 117*  BUN 53* 50* 48* 45* 49* 52*  CREATININE 5.24* 4.80* 4.29* 4.55* 4.71* 4.54*  CALCIUM 8.4* 8.1* 7.6* 7.7* 8.2* 8.0*  AST 20  --   --   --   --   --   ALT 13  --   --   --   --   --   ALKPHOS 98  --   --   --   --   --   BILITOT 0.8  --   --   --   --   --    ------------------------------------------------------------------------------------------------------------------ No results for input(s): CHOL, HDL, LDLCALC, TRIG, CHOLHDL, LDLDIRECT in the last 72 hours.  Lab Results  Component Value Date   HGBA1C 6.2 (H) 03/14/2019    ------------------------------------------------------------------------------------------------------------------ No results for input(s): TSH, T4TOTAL, T3FREE, THYROIDAB in the last 72 hours.  Invalid input(s): FREET3 ------------------------------------------------------------------------------------------------------------------ No results for input(s): VITAMINB12, FOLATE, FERRITIN, TIBC, IRON, RETICCTPCT in the last 72 hours.  Coagulation profile Recent Labs  Lab 03/13/19 1745 03/15/19 0555 03/18/19 0332  INR 1.1 1.2 1.1    No results for input(s): DDIMER in the last 72 hours.  Cardiac Enzymes No results for input(s): CKMB, TROPONINI, MYOGLOBIN in the last 168 hours.  Invalid input(s): CK ------------------------------------------------------------------------------------------------------------------ No results found for: BNP  Micro Results Recent Results (from the past 240 hour(s))  SARS CORONAVIRUS 2 (TAT 6-24 HRS) Nasopharyngeal Nasopharyngeal Swab     Status: None   Collection Time: 03/13/19  7:11 PM   Specimen: Nasopharyngeal Swab  Result Value Ref Range Status   SARS Coronavirus 2 NEGATIVE NEGATIVE Final    Comment: (NOTE) SARS-CoV-2 target nucleic acids are NOT DETECTED. The SARS-CoV-2 RNA is generally detectable in upper and lower respiratory specimens during the acute phase of infection. Negative results do not preclude SARS-CoV-2 infection, do not rule out co-infections with other pathogens, and should not be used as the sole basis for treatment or other patient management decisions. Negative results must be combined with clinical observations, patient history, and epidemiological information. The expected result is Negative. Fact Sheet for Patients: SugarRoll.be Fact Sheet for Healthcare Providers: https://www.woods-mathews.com/ This test is not yet approved or cleared by the [place] FDA and  has been  authorized for detection and/or diagnosis of SARS-CoV-2 by FDA under an Emergency Use Authorization (EUA). This EUA will remain  in effect (meaning this test can be used) for the duration of the COVID-19 declaration under Section 56 4(b)(1) of the Act, 21 U.S.C. section 360bbb-3(b)(1), unless the authorization is terminated or revoked sooner. Performed at [facility], [place] [address], [place], [place] [address]     Radiology  Reports CT ABDOMEN PELVIS WO CONTRAST  Result Date: 03/14/2019 CLINICAL DATA:  Renal failure.  Anemia.  Microscopic hematuria. EXAM: CT ABDOMEN AND PELVIS WITHOUT CONTRAST TECHNIQUE: Multidetector CT imaging of the abdomen and pelvis was performed following the standard protocol without IV contrast. COMPARISON:  03/13/2019 renal ultrasound.  No prior CT. FINDINGS: Lower chest: Emphysema. Normal heart size without pericardial or pleural effusion. Multivessel coronary artery atherosclerosis. Hepatobiliary: Normal noncontrast appearance of the liver. Small gallstones without acute cholecystitis or biliary duct dilatation. Pancreas: Normal, without mass or ductal dilatation. Spleen: Normal in size, without focal abnormality. Adrenals/Urinary Tract: Normal adrenal glands. Mild renal cortical thinning bilaterally. Perirenal interstitial thickening. Exophytic interpolar right renal 3.8 cm lesion measures 48 HU on 31/3. No renal calculi. Mild bilateral hydroureteronephrosis. Hydroureter is followed to the level of the urinary bladder, without obstructive mass. There is a nonobstructive punctate mid right ureteric stone on 59/3 and sagittal image 85. Foley catheter within the urinary bladder. The bladder is thick walled with mild surrounding edema. Stomach/Bowel: Normal stomach, without wall thickening. Normal colon, appendix, and terminal ileum. Normal small bowel. Vascular/Lymphatic: Advanced aortic and branch vessel atherosclerosis. Mildly prominent abdominal retroperitoneal nodes  are not pathologic by size criteria and likely reactive. Prominent bilateral inguinal nodes are also identified and favored to be reactive. Reproductive: Normal prostate. Other: No significant free fluid. Musculoskeletal: Lumbosacral spondylosis. Minimal wedging of the T11 superior endplate. IMPRESSION: 1. Bilateral mild hydroureteronephrosis, followed to the level of the urinary bladder. The bladder is thick walled with surrounding pericystic edema in the setting of a Foley catheter. Considerations include cystitis and/or bladder outlet obstruction. Perirenal interstitial thickening is nonspecific but likely related to the clinical history of renal insufficiency. 2. 3 mm mid right ureteric nonobstructive stone. 3. Right renal mass is technically indeterminate on noncontrast exam. Most consistent with a cyst on yesterday's ultrasound. Consider surveillance with renal ultrasound at 6 months and attention to the right renal lesion. 4. Coronary artery atherosclerosis. Aortic Atherosclerosis (ICD10-I70.0). Emphysema (ICD10-J43.9). 5. Cholelithiasis. Electronically Signed   By: Abigail Miyamoto M.D.   On: 03/14/2019 19:44   US Renal  Result Date: 03/13/2019 CLINICAL DATA:  63 year old male with acute renal failure. History of diabetes. EXAM: RENAL / URINARY TRACT ULTRASOUND COMPLETE COMPARISON:  None. FINDINGS: Evaluation is limited as the patient was not able to cooperate with exam and due to portable technique. Right Kidney: Renal measurements: 12.6 x 4.4 x 4.1 cm = volume: 117 mL. There is moderate parenchyma atrophy. There is increased renal parenchymal echogenicity. There is a 3.3 x 2.4 x 3.1 cm upper pole cyst. There is moderate right hydronephrosis. No shadowing stone. Left Kidney: Renal measurements: 12.8 x 4.8 x 5.1 cm = volume: 164 ML. There is mild parenchyma atrophy and cortical thinning. There is increased renal parenchymal echogenicity. Mild-to-moderate hydronephrosis. No shadowing stone. Bladder: Not well  visualized. Other: None. IMPRESSION: 1. Increased renal parenchyma echogenicity in keeping with chronic kidney disease. 2. Bilateral hydronephrosis, right greater left. No shadowing stone. Electronically Signed   By: Anner Crete M.D.   On: 03/13/2019 20:15   US Venous Img Lower Bilateral  Result Date: 03/13/2019 CLINICAL DATA:  Leg swelling right greater than left, calf pain and bilateral foot pain EXAM: BILATERAL LOWER EXTREMITY VENOUS DOPPLER ULTRASOUND TECHNIQUE: Gray-scale sonography with graded compression, as well as color Doppler and duplex ultrasound were performed to evaluate the lower extremity deep venous systems from the level of the common femoral vein and including the common femoral, femoral, profunda femoral,  popliteal and calf veins including the posterior tibial, peroneal and gastrocnemius veins when visible. The superficial great saphenous vein was also interrogated. Spectral Doppler was utilized to evaluate flow at rest and with distal augmentation maneuvers in the common femoral, femoral and popliteal veins. COMPARISON:  None FINDINGS: RIGHT LOWER EXTREMITY Common Femoral Vein: No evidence of thrombus. Normal compressibility, respiratory phasicity and response to augmentation. Saphenofemoral Junction: No evidence of thrombus. Normal compressibility and flow on color Doppler imaging. Profunda Femoral Vein: No evidence of thrombus. Normal compressibility and flow on color Doppler imaging. Femoral Vein: No evidence of thrombus. Normal compressibility, respiratory phasicity and response to augmentation. Popliteal Vein: No evidence of thrombus. Normal compressibility, respiratory phasicity and response to augmentation. Calf Veins: No evidence of thrombus. Normal compressibility and flow on color Doppler imaging. Superficial Great Saphenous Vein: No evidence of thrombus. Normal compressibility. Venous Reflux:  None. Other Findings:  None. LEFT LOWER EXTREMITY Common Femoral Vein: No evidence  of thrombus. Normal compressibility, respiratory phasicity and response to augmentation. Saphenofemoral Junction: No evidence of thrombus. Normal compressibility and flow on color Doppler imaging. Profunda Femoral Vein: No evidence of thrombus. Normal compressibility and flow on color Doppler imaging. Femoral Vein: No evidence of thrombus. Normal compressibility, respiratory phasicity and response to augmentation. Popliteal Vein: No evidence of thrombus. Normal compressibility, respiratory phasicity and response to augmentation. Calf Veins: No evidence of thrombus. Normal compressibility and flow on color Doppler imaging. Superficial Great Saphenous Vein: No evidence of thrombus. Normal compressibility. Venous Reflux:  None. Other Findings: Atheromatous plaque throughout visualized arterial vessels with abundant eccentric soft plaque in the right popliteal artery. IMPRESSION: No evidence of deep venous thrombosis in either lower extremity. Signs of arterial vascular plaque with more pronounced changes incidentally noted in the right popliteal artery. Electronically Signed   By: Zetta Bills M.D.   On: 03/13/2019 19:03   DG Chest Port 1 View  Result Date: 03/13/2019 CLINICAL DATA:  Renal failure.  Hypertension and smoking. EXAM: PORTABLE CHEST 1 VIEW COMPARISON:  None. FINDINGS: The heart size and mediastinal contours are within normal limits. Both lungs are clear. The visualized skeletal structures are unremarkable. IMPRESSION: No active disease. Electronically Signed   By: Constance Holster M.D.   On: 03/13/2019 20:36   VAS [place] ABI WITH/WO TBI  Result Date: 03/15/2019 LOWER EXTREMITY DOPPLER STUDY  Performing Technologist: Lita Mains RDMS, RVT  Examination Guidelines: A complete evaluation includes at minimum, Doppler waveform signals and systolic blood pressure reading at the level of bilateral brachial, anterior tibial, and posterior tibial arteries, when vessel segments are accessible. Bilateral  testing is considered an integral part of a complete examination. Photoelectric Plethysmograph (PPG) waveforms and toe systolic pressure readings are included as required and additional duplex testing as needed. Limited examinations for reoccurring indications may be performed as noted.  ABI Findings: +--------+------------------+-----+---------+--------+ Right   Rt Pressure (mmHg)IndexWaveform Comment  +--------+------------------+-----+---------+--------+ [id number]                    triphasic         +--------+------------------+-----+---------+--------+ PTA     219               1.21 triphasic         +--------+------------------+-----+---------+--------+ DP      222               1.23 triphasic         +--------+------------------+-----+---------+--------+ +--------+------------------+-----+---------+-------+ Left    Lt Pressure (mmHg)IndexWaveform Comment +--------+------------------+-----+---------+-------+ [id number]  triphasic        +--------+------------------+-----+---------+-------+ PTA     220               1.22 biphasic         +--------+------------------+-----+---------+-------+ DP      217               1.20 triphasic        +--------+------------------+-----+---------+-------+ +-------+-----------+-----------+------------+------------+ ABI/TBIToday's ABIToday's TBIPrevious ABIPrevious TBI +-------+-----------+-----------+------------+------------+ Right  1.23                                           +-------+-----------+-----------+------------+------------+ Left   1.22                                           +-------+-----------+-----------+------------+------------+  Summary: Right: Resting right ankle-brachial index is within normal range. No evidence of significant right lower extremity arterial disease. Left: Resting left ankle-brachial index is within normal range. No evidence of significant left lower  extremity arterial disease.  *See table(s) above for measurements and observations.  Electronically signed by Harold Barban MD on 03/15/2019 at 5:23:12 PM.   Final    US BIOPSY (KIDNEY)  Result Date: 03/18/2019 INDICATION: 63 year old male with a history renal dysfunction referred for medical renal biopsy EXAM: IMAGE GUIDED MEDICAL RENAL BIOPSY MEDICATIONS: None. ANESTHESIA/SEDATION: Moderate (conscious) sedation was employed during this procedure. A total of Versed 1.0 mg and Fentanyl 25 mcg was administered intravenously. Moderate Sedation Time: 13 minutes. The patient's level of consciousness and vital signs were monitored continuously by radiology nursing throughout the procedure under my direct supervision. FLUOROSCOPY TIME:  None COMPLICATIONS: None PROCEDURE: Informed written consent was obtained from the patient after a thorough discussion of the procedural risks, benefits and alternatives. All questions were addressed. Maximal Sterile Barrier Technique was utilized including caps, mask, sterile gowns, sterile gloves, sterile drape, hand hygiene and skin antiseptic. A timeout was performed prior to the initiation of the procedure. Patient was positioned prone position on the gantry table. Images were stored sent to PACs. Once the patient is prepped and draped in the usual sterile fashion, the skin and subcutaneous tissues overlying the left kidney were generously infiltrated 1% lidocaine for local anesthesia. Using ultrasound guidance, a 15 gauge guide needle was advanced into the lower cortex of the left kidney. Once we confirmed location of the needle tip, 2 separate 16 gauge core biopsy were achieved. Two Gel-Foam pledgets were infused with a small amount of saline. The needle was removed. Final images were stored. The patient tolerated the procedure well and remained hemodynamically stable throughout. No complications were encountered and no significant blood loss encountered. IMPRESSION: Status post  ultrasound-guided medical renal biopsy. Signed, Dulcy Fanny. Dellia Nims, RPVI [facility] Radiology Electronically Signed   By: Corrie Mckusick D.O.   On: 03/18/2019 09:57     Time Spent in minutes  30     Desiree Hane M.D on 03/19/2019 at 1:22 PM  To page go to [email or web address] - password [email or web address]

## 2019-03-19 NOTE — Progress Notes (Signed)
Foley d/c'd with no complications. Pt informed of 6hr window to void. Clean urinal provided.

## 2019-03-20 LAB — BASIC METABOLIC PANEL
Anion gap: 8 (ref 5–15)
BUN: 56 mg/dL — ABNORMAL HIGH (ref 8–23)
CO2: 22 mmol/L (ref 22–32)
Calcium: 8.1 mg/dL — ABNORMAL LOW (ref 8.9–10.3)
Chloride: 97 mmol/L — ABNORMAL LOW (ref 98–111)
Creatinine, Ser: 4.88 mg/dL — ABNORMAL HIGH (ref 0.61–1.24)
GFR calc Af Amer: 14 mL/min — ABNORMAL LOW (ref >60)
GFR calc non Af Amer: 12 mL/min — ABNORMAL LOW (ref >60)
Glucose, Bld: 120 mg/dL — ABNORMAL HIGH (ref 70–99)
Potassium: 4.4 mmol/L (ref 3.5–5.1)
Sodium: 127 mmol/L — ABNORMAL LOW (ref 135–145)

## 2019-03-20 LAB — URINALYSIS, ROUTINE W REFLEX MICROSCOPIC
Bilirubin Urine: NEGATIVE
Glucose, UA: NEGATIVE mg/dL
Ketones, ur: NEGATIVE mg/dL
Nitrite: NEGATIVE
Protein, ur: 100 mg/dL — AB
Specific Gravity, Urine: 1.008 (ref 1.005–1.030)
WBC, UA: >50 WBC/hpf — ABNORMAL HIGH (ref 0–5)
pH: 7 (ref 5.0–8.0)

## 2019-03-20 LAB — CBC
HCT: 22.8 % — ABNORMAL LOW (ref 39.0–52.0)
Hemoglobin: 7.7 g/dL — ABNORMAL LOW (ref 13.0–17.0)
MCH: 33.5 pg (ref 26.0–34.0)
MCHC: 33.8 g/dL (ref 30.0–36.0)
MCV: 99.1 fL (ref 80.0–100.0)
Platelets: 211 10*3/uL (ref 150–400)
RBC: 2.3 MIL/uL — ABNORMAL LOW (ref 4.22–5.81)
RDW: 14.6 % (ref 11.5–15.5)
WBC: 7.2 10*3/uL (ref 4.0–10.5)
nRBC: 0 % (ref 0.0–0.2)

## 2019-03-20 LAB — GLUCOSE, CAPILLARY
Glucose-Capillary: 115 mg/dL — ABNORMAL HIGH (ref 70–99)
Glucose-Capillary: 149 mg/dL — ABNORMAL HIGH (ref 70–99)
Glucose-Capillary: 124 mg/dL — ABNORMAL HIGH (ref 70–99)
Glucose-Capillary: 170 mg/dL — ABNORMAL HIGH (ref 70–99)

## 2019-03-20 LAB — CRYOGLOBULIN

## 2019-03-20 NOTE — Progress Notes (Signed)
Patient ID: Louis Butler, male   DOB: 1955-10-24, 63 y.o.   MRN: [id number] [facility] Progress Note   Assessment/ Plan:   1. 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  Renal biopsy undertaken yesterday.  He remains nonoliguric and with some fluctuation of creatinine but without any acute electrolyte abnormality or indications for dialysis.  Hemoglobin A1c was 6.2% and screening for plasma cell dyscrasia negative.  Foley catheter discontinued yesterday without any problems voiding thereafter. 2.  Hyponatremia: Euvolemic, appears to be likely chronic (from alcohol use-beer potomania) and may be aggravated by recent acute kidney injury.  Begin fluid restriction. 3.  Anion gap metabolic acidosis: Improving with ongoing sodium bicarbonate supplementation. 4.  Hypertension: Blood pressures currently appear to be under good control on amlodipine monotherapy.  Subjective:   Denies any acute events overnight, denies chest pain or shortness of breath.   Objective:   BP (!) 147/65 (BP Location: Left Arm)   Pulse 82   Temp 97.8 F (36.6 C)   Resp 18   Ht 6\' 2"  (1.88 m)   Wt 92.3 kg   SpO2 100%   BMI 26.13 kg/m   Intake/Output Summary (Last 24 hours) at 03/20/2019 3557 Last data filed at 03/20/2019 0900 Gross per 24 hour  Intake 1020 ml  Output 1971 ml  Net -951 ml   Weight change:   Physical Exam: Gen: Comfortably resting in bed CVS: Pulse regular rhythm, normal rate, S1 and S2 normal Resp: Clear to auscultation, no rales/rhonchi Abd: Soft, flat, nontender Ext: No lower extremity edema, evolving skin rash over legs/ankles noted now with some hyperpigmentation  Imaging: US BIOPSY (KIDNEY)  Result Date:  03/18/2019 INDICATION: 63 year old male with a history renal dysfunction referred for medical renal biopsy EXAM: IMAGE GUIDED MEDICAL RENAL BIOPSY MEDICATIONS: None. ANESTHESIA/SEDATION: Moderate (conscious) sedation was employed during this procedure. A total of Versed 1.0 mg and Fentanyl 25 mcg was administered intravenously. Moderate Sedation Time: 13 minutes. The patient's level of consciousness and vital signs were monitored continuously by radiology nursing throughout the procedure under my direct supervision. FLUOROSCOPY TIME:  None COMPLICATIONS: None PROCEDURE: Informed written consent was obtained from the patient after a thorough discussion of the procedural risks, benefits and alternatives. All questions were addressed. Maximal Sterile Barrier Technique was utilized including caps, mask, sterile gowns, sterile gloves, sterile drape, hand hygiene and skin antiseptic. A timeout was performed prior to the initiation of the procedure. Patient was positioned prone position on the gantry table. Images were stored sent to PACs. Once the patient is prepped and draped in the usual sterile fashion, the skin and subcutaneous tissues overlying the left kidney were generously infiltrated 1% lidocaine for local anesthesia. Using ultrasound guidance, a 15 gauge guide needle was advanced into the lower cortex of the left kidney. Once we confirmed location of the needle tip, 2 separate 16 gauge core biopsy were achieved. Two Gel-Foam pledgets were infused with a small amount of saline. The needle was removed. Final images were stored. The patient tolerated the procedure well and remained hemodynamically stable throughout. No complications were encountered and no significant blood loss encountered. IMPRESSION: Status post ultrasound-guided medical renal biopsy. Signed, Dulcy Fanny. Dellia Nims, RPVI [facility] Radiology Electronically Signed   By:  Corrie Mckusick D.O.   On:  03/18/2019 09:57    Labs: BMET Recent Labs  Lab 03/14/19 1824 03/15/19 0555 03/16/19 0228 03/17/19 0438 03/18/19 0332 03/19/19 0538 03/20/19 0854  NA 130* 132* 132* 131* 131* 132* 127*  K 4.9 4.5 4.5 4.3 4.8 4.4 4.4  CL 106 107 108 105 104 102 97*  CO2 18* 18* 17* 20* 20* 21* 22  GLUCOSE 152* 129* 104* 103* 130* 117* 120*  BUN 51* 50* 48* 45* 49* 52* 56*  CREATININE 4.80* 4.80* 4.29* 4.55* 4.71* 4.54* 4.88*  CALCIUM 8.1* 8.1* 7.6* 7.7* 8.2* 8.0* 8.1*  PHOS  --   --   --  3.2  --   --   --    CBC Recent Labs  Lab 03/13/19 1745 03/14/19 1052 03/17/19 0934 03/18/19 0332 03/19/19 0538 03/20/19 0447  WBC 8.3 7.5 7.9 7.6 7.3 7.2  NEUTROABS 6.8 5.5  --   --   --   --   HGB 9.7* 8.3* 7.7* 7.9* 8.2* 7.7*  HCT 29.1* 25.5* 22.8* 23.3* 24.6* 22.8*  MCV 98.6 100.8* 99.1 98.7 100.0 99.1  PLT 220 180 150 159 191 211    Medications:    . amLODipine  2.5 mg Oral Daily  . Chlorhexidine Gluconate Cloth  6 each Topical Daily  . darbepoetin (ARANESP) injection - NON-DIALYSIS  100 mcg Subcutaneous Q Thu-1800  . insulin aspart  0-5 Units Subcutaneous QHS  . insulin aspart  0-9 Units Subcutaneous TID WC  . polyethylene glycol  17 g Oral BID  . senna-docusate  2 tablet Oral BID  . sodium bicarbonate  650 mg Oral BID  . tamsulosin  0.4 mg Oral QPC supper  . thiamine  100 mg Oral Daily   Or  . thiamine  100 mg Intravenous Daily   Elmarie Shiley, MD 03/20/2019, 9:39 AM

## 2019-03-20 NOTE — Progress Notes (Signed)
Pt voided 50 cc at 2200 Saturday night, bladder scan done showed 199cc of urine in bladder.DR Nevin Bloodgood notified no orders given.pt voided 250cc at 0300 Sunday morning will continue to monitor

## 2019-03-20 NOTE — Progress Notes (Addendum)
Patient noted to have widespread papular rash extending from right side of face to right side of neck.  Patient complains of "burning on urination".  Will obtain urinalysis as per MD order.  Message sent to MD re:  epidermal condition.

## 2019-03-20 NOTE — Progress Notes (Signed)
Urine sent for UA

## 2019-03-20 NOTE — Progress Notes (Signed)
Louis Butler  PROGRESS NOTE  TRES GRZYWACZ DXI:[id number] DOB: [birth year] DOA: 03/13/2019 PCP: Louis Butler, [name] Admit date - 03/13/2019   Admitting Physician Shela Leff, MD  Outpatient Primary MD for the Louis Butler is Louis Butler, [name]  LOS - 7 Brief Narrative   Louis Butler is a 63 y.o. year old male with medical history significant for  who presented on 03/13/2019 with HTN, diabetes, not followed by PCP for at least 2 years and on no home medications presented to [facility] due to lower extremity rash x2 to 4 weeks with bilateral foot swelling and pain right greater than left.  Admitted to [facility] with working diagnosis of AKI with creatinine of 5.24 (baseline 1.2, 11/2016), acute anemia ( hgb 9 , from previous baseline of 9) without bleeding and urinary retention and concern for UTI started on ceftriaxone. Louis Butler was transferred to [facility] and admitted with working diagnosis of UTI, AKI with urinary retention.    Hospital course complicated by nonblanching purpuric rash concerning for vasculitic process in the setting of AKI.  Subjective  [name] today continues to have mild pain in both feet. Still making urine after foley removed. Some irritation with peeing, no hematuria. No abdominal pain, No headache, No chest pain, No Nausea or vomiting, No new weakness, No Cough - SOB.   A & P    Nonoliguric AKI with urinary retention and bilateral hydronephrosis, stable.  Creatinine stable. Concern for ANCA negative vasculitis given Decrease in C3 and purpura.  Good urine output with. Suspect diabetes/HTN could also be contributing to to some element of chronicity.  Peak creatinine 5.24.  (Baseline 1.2 on 11/2016) -Nephrology recommends continuing to monitor function no current need for dialysis --d/c foley and closely monitor output -s/p IR guided biopsy, 12/11, f/u results -Started Flomax here, continue closely monitor anticipate discontinue Foley next 24 to 48  hours  Irritation with urination.  Unclear if this is true dysuria, as Louis Butler had Foley catheter recently removed and still having some irritation from that point urinating quite well without any difficulty UA shows pyuria with rare bacteria.  Pyuria has been present since admission, likely related to AKI? -Will obtain urine culture, if significant CFU's will initiate treatment for UTI  Nonblanching, purpuric rash,stable.  Most concerning for vasculitic process in the setting of AKI, differential includes postinfectious GN, MPGN, or AIN.  C3 slightly decreased, hep C/anti-GBM/hep B/HIV/ANA unremarkable, -s/p IR guided biopsy, on 12/11   Non- Anion gap metabolic acidosis, improving in the setting of AKI due to above, improving after starting sodium bicarb - continue sodium bicarbonate 650 mg TID -monitor BMP  Normocytic anemia, stable likely due to kidney disease, likely has some element of advanced chronic in the setting of diabetic nephropathy.  No signs of active bleeding here.iron panel with wnl iron and decreased TIBC.  --ESA per nephrology -Monitor CBC  Mild hyponatremia, stable.  Likely chronic from beer potomania from chronic alcohol, euvolemic.  -monitor BMP, fluid restrict  Diabetes mellitus, A1c at goal.   -Closely monitor CBG with sliding scale insulin as needed, will likely need Metformin on discharge, monitor.  Hypertension, at goal. -Continue amlodipine 2.5 mg(started here)  Reported epistaxis.  No recurrent episodes here.hgb low as mentioned above, platelets unremarkable. -Continue to closely monitor  Constipation. No nausea or emesis or abdominal pain. NO BM in a week. Normal appetite --BID senna docusate --increase to BID miralax --monitor  Tobacco/alcohol dependence.  Emphasized cessation of both.  Reports binge drinking on the weekends Louis Butler agreeable, no current signs of withdrawal. -Monitor for alcohol withdrawal on CIWA protocol -Continue thiamine  supplementation  Family Communication  : None at bedside  Code Status : Full code  Disposition Plan  : Close monitoring kidney function, metabolic acidosis, f/u renal biopsy results  Consults  : Nephrology  Procedures  : None  DVT Prophylaxis  :  SCDs   Lab Results  Component Value Date   PLT 211 03/20/2019    Diet :  Diet Order            Diet heart healthy/carb modified Room service appropriate? Yes; Fluid consistency: Thin; Fluid restriction: 1200 mL Fluid  Diet effective now               Inpatient Medications Scheduled Meds: . amLODipine  2.5 mg Oral Daily  . Chlorhexidine Gluconate Cloth  6 each Topical Daily  . darbepoetin (ARANESP) injection - NON-DIALYSIS  100 mcg Subcutaneous Q Thu-1800  . insulin aspart  0-5 Units Subcutaneous QHS  . insulin aspart  0-9 Units Subcutaneous TID WC  . polyethylene glycol  17 g Oral BID  . senna-docusate  2 tablet Oral BID  . sodium bicarbonate  650 mg Oral BID  . tamsulosin  0.4 mg Oral QPC supper  . thiamine  100 mg Oral Daily   Or  . thiamine  100 mg Intravenous Daily   Continuous Infusions: PRN Meds:.acetaminophen, fentaNYL (SUBLIMAZE) injection, hydrALAZINE, ondansetron **OR** ondansetron (ZOFRAN) IV, traMADol  Antibiotics  :   Anti-infectives (From admission, onward)   Start     Dose/Rate Route Frequency Ordered Stop   03/14/19 1900  cefTRIAXone (ROCEPHIN) 1 g in sodium chloride 0.9 % 100 mL IVPB     1 g 200 mL/hr over 30 Minutes Intravenous  Once 03/14/19 1001 03/14/19 2235   03/13/19 1945  cefTRIAXone (ROCEPHIN) 1 g in sodium chloride 0.9 % 100 mL IVPB     1 g 200 mL/hr over 30 Minutes Intravenous  Once 03/13/19 1932 03/13/19 2016       Objective   Vitals:   03/19/19 1700 03/19/19 2053 03/20/19 0513 03/20/19 0916  BP: 119/62 131/69 (!) 152/61 (!) 147/65  Pulse: 79 75 83 82  Resp: 18 18 15 18   Temp: 98.7 F (37.1 C) 98.7 F (37.1 C) 98.1 F (36.7 C) 97.8 F (36.6 C)  TempSrc: Oral Oral Oral     SpO2: 99% 98% 100% 100%  Weight:      Height:        SpO2: 100 % O2 Flow Rate (L/min): 2 L/min  Wt Readings from Last 3 Encounters:  03/18/19 92.3 kg  11/28/17 90.3 kg  02/06/17 90.7 kg     Intake/Output Summary (Last 24 hours) at 03/20/2019 1641 Last data filed at 03/20/2019 1000 Gross per 24 hour  Intake 840 ml  Output 1271 ml  Net -431 ml    Physical Exam:  Awake Alert, Oriented X 3, flat affect No new F.N deficits,  [place].AT, Symmetrical Chest wall movement, Good air movement bilaterally, CTAB RRR,No Gallops,Rubs or new Murmurs,  Lower extremity purpuric rash unchanged          I have personally reviewed the following:   Data Reviewed:  CBC Recent Labs  Lab 03/13/19 1745 03/14/19 1052 03/16/19 0228 03/17/19 0934 03/18/19 0332 03/19/19 0538 03/20/19 0447  WBC 8.3 7.5 7.4 7.9 7.6 7.3 7.2  HGB 9.7* 8.3* 8.1* 7.7* 7.9* 8.2* 7.7*  HCT 29.1* 25.5*  24.3* 22.8* 23.3* 24.6* 22.8*  PLT 220 180 159 150 159 191 211  MCV 98.6 100.8* 100.8* 99.1 98.7 100.0 99.1  MCH 32.9 32.8 33.6 33.5 33.5 33.3 33.5  MCHC 33.3 32.5 33.3 33.8 33.9 33.3 33.8  RDW 14.6 14.8 14.6 14.6 14.4 14.6 14.6  LYMPHSABS 0.9 1.2  --   --   --   --   --   MONOABS 0.4 0.4  --   --   --   --   --   EOSABS 0.1 0.4  --   --   --   --   --   BASOSABS 0.0 0.0  --   --   --   --   --     Chemistries  Recent Labs  Lab 03/13/19 1745 03/16/19 0228 03/17/19 0438 03/18/19 0332 03/19/19 0538 03/20/19 0854  NA 131* 132* 131* 131* 132* 127*  K 4.6 4.5 4.3 4.8 4.4 4.4  CL 104 108 105 104 102 97*  CO2 17* 17* 20* 20* 21* 22  GLUCOSE 144* 104* 103* 130* 117* 120*  BUN 53* 48* 45* 49* 52* 56*  CREATININE 5.24* 4.29* 4.55* 4.71* 4.54* 4.88*  CALCIUM 8.4* 7.6* 7.7* 8.2* 8.0* 8.1*  AST 20  --   --   --   --   --   ALT 13  --   --   --   --   --   ALKPHOS 98  --   --   --   --   --   BILITOT 0.8  --   --   --   --   --     ------------------------------------------------------------------------------------------------------------------ No results for input(s): CHOL, HDL, LDLCALC, TRIG, CHOLHDL, LDLDIRECT in the last 72 hours.  Lab Results  Component Value Date   HGBA1C 6.2 (H) 03/14/2019   ------------------------------------------------------------------------------------------------------------------ No results for input(s): TSH, T4TOTAL, T3FREE, THYROIDAB in the last 72 hours.  Invalid input(s): FREET3 ------------------------------------------------------------------------------------------------------------------ No results for input(s): VITAMINB12, FOLATE, FERRITIN, TIBC, IRON, RETICCTPCT in the last 72 hours.  Coagulation profile Recent Labs  Lab 03/13/19 1745 03/15/19 0555 03/18/19 0332  INR 1.1 1.2 1.1    No results for input(s): DDIMER in the last 72 hours.  Cardiac Enzymes No results for input(s): CKMB, TROPONINI, MYOGLOBIN in the last 168 hours.  Invalid input(s): CK ------------------------------------------------------------------------------------------------------------------ No results found for: BNP  Micro Results Recent Results (from the past 240 hour(s))  SARS CORONAVIRUS 2 (TAT 6-24 HRS) Nasopharyngeal Nasopharyngeal Swab     Status: None   Collection Time: 03/13/19  7:11 PM   Specimen: Nasopharyngeal Swab  Result Value Ref Range Status   SARS Coronavirus 2 NEGATIVE NEGATIVE Final    Comment: (NOTE) SARS-CoV-2 target nucleic acids are NOT DETECTED. The SARS-CoV-2 RNA is generally detectable in upper and lower respiratory specimens during the acute phase of infection. Negative results do not preclude SARS-CoV-2 infection, do not rule out co-infections with other pathogens, and should not be used as the sole basis for treatment or other Louis Butler management decisions. Negative results must be combined with clinical observations, Louis Butler history, and epidemiological  information. The expected result is Negative. Fact Sheet for Patients: SugarRoll.be Fact Sheet for Healthcare Providers: https://www.woods-mathews.com/ This test is not yet approved or cleared by the [place] FDA and  has been authorized for detection and/or diagnosis of SARS-CoV-2 by FDA under an Emergency Use Authorization (EUA). This EUA will remain  in effect (meaning this test can be used) for the duration of  the COVID-19 declaration under Section 56 4(b)(1) of the Act, 21 U.S.C. section 360bbb-3(b)(1), unless the authorization is terminated or revoked sooner. Performed at [facility], [place] [address], [place], [place] [address]     Radiology Reports CT ABDOMEN PELVIS WO CONTRAST  Result Date: 03/14/2019 CLINICAL DATA:  Renal failure.  Anemia.  Microscopic hematuria. EXAM: CT ABDOMEN AND PELVIS WITHOUT CONTRAST TECHNIQUE: Multidetector CT imaging of the abdomen and pelvis was performed following the standard protocol without IV contrast. COMPARISON:  03/13/2019 renal ultrasound.  No prior CT. FINDINGS: Lower chest: Emphysema. Normal heart size without pericardial or pleural effusion. Multivessel coronary artery atherosclerosis. Hepatobiliary: Normal noncontrast appearance of the liver. Small gallstones without acute cholecystitis or biliary duct dilatation. Pancreas: Normal, without mass or ductal dilatation. Spleen: Normal in size, without focal abnormality. Adrenals/Urinary Tract: Normal adrenal glands. Mild renal cortical thinning bilaterally. Perirenal interstitial thickening. Exophytic interpolar right renal 3.8 cm lesion measures 48 HU on 31/3. No renal calculi. Mild bilateral hydroureteronephrosis. Hydroureter is followed to the level of the urinary bladder, without obstructive mass. There is a nonobstructive punctate mid right ureteric stone on 59/3 and sagittal image 85. Foley catheter within the urinary bladder. The bladder is  thick walled with mild surrounding edema. Stomach/Bowel: Normal stomach, without wall thickening. Normal colon, appendix, and terminal ileum. Normal small bowel. Vascular/Lymphatic: Advanced aortic and branch vessel atherosclerosis. Mildly prominent abdominal retroperitoneal nodes are not pathologic by size criteria and likely reactive. Prominent bilateral inguinal nodes are also identified and favored to be reactive. Reproductive: Normal prostate. Other: No significant free fluid. Musculoskeletal: Lumbosacral spondylosis. Minimal wedging of the T11 superior endplate. IMPRESSION: 1. Bilateral mild hydroureteronephrosis, followed to the level of the urinary bladder. The bladder is thick walled with surrounding pericystic edema in the setting of a Foley catheter. Considerations include cystitis and/or bladder outlet obstruction. Perirenal interstitial thickening is nonspecific but likely related to the clinical history of renal insufficiency. 2. 3 mm mid right ureteric nonobstructive stone. 3. Right renal mass is technically indeterminate on noncontrast exam. Most consistent with a cyst on yesterday's ultrasound. Consider surveillance with renal ultrasound at 6 months and attention to the right renal lesion. 4. Coronary artery atherosclerosis. Aortic Atherosclerosis (ICD10-I70.0). Emphysema (ICD10-J43.9). 5. Cholelithiasis. Electronically Signed   By: Abigail Miyamoto M.D.   On: 03/14/2019 19:44   US Renal  Result Date: 03/13/2019 CLINICAL DATA:  63 year old male with acute renal failure. History of diabetes. EXAM: RENAL / URINARY TRACT ULTRASOUND COMPLETE COMPARISON:  None. FINDINGS: Evaluation is limited as the Louis Butler was not able to cooperate with exam and due to portable technique. Right Kidney: Renal measurements: 12.6 x 4.4 x 4.1 cm = volume: 117 mL. There is moderate parenchyma atrophy. There is increased renal parenchymal echogenicity. There is a 3.3 x 2.4 x 3.1 cm upper pole cyst. There is moderate right  hydronephrosis. No shadowing stone. Left Kidney: Renal measurements: 12.8 x 4.8 x 5.1 cm = volume: 164 ML. There is mild parenchyma atrophy and cortical thinning. There is increased renal parenchymal echogenicity. Mild-to-moderate hydronephrosis. No shadowing stone. Bladder: Not well visualized. Other: None. IMPRESSION: 1. Increased renal parenchyma echogenicity in keeping with chronic kidney disease. 2. Bilateral hydronephrosis, right greater left. No shadowing stone. Electronically Signed   By: Anner Crete M.D.   On: 03/13/2019 20:15   US Venous Img Lower Bilateral  Result Date: 03/13/2019 CLINICAL DATA:  Leg swelling right greater than left, calf pain and bilateral foot pain EXAM: BILATERAL LOWER EXTREMITY VENOUS DOPPLER ULTRASOUND  TECHNIQUE: Gray-scale sonography with graded compression, as well as color Doppler and duplex ultrasound were performed to evaluate the lower extremity deep venous systems from the level of the common femoral vein and including the common femoral, femoral, profunda femoral, popliteal and calf veins including the posterior tibial, peroneal and gastrocnemius veins when visible. The superficial great saphenous vein was also interrogated. Spectral Doppler was utilized to evaluate flow at rest and with distal augmentation maneuvers in the common femoral, femoral and popliteal veins. COMPARISON:  None FINDINGS: RIGHT LOWER EXTREMITY Common Femoral Vein: No evidence of thrombus. Normal compressibility, respiratory phasicity and response to augmentation. Saphenofemoral Junction: No evidence of thrombus. Normal compressibility and flow on color Doppler imaging. Profunda Femoral Vein: No evidence of thrombus. Normal compressibility and flow on color Doppler imaging. Femoral Vein: No evidence of thrombus. Normal compressibility, respiratory phasicity and response to augmentation. Popliteal Vein: No evidence of thrombus. Normal compressibility, respiratory phasicity and response to  augmentation. Calf Veins: No evidence of thrombus. Normal compressibility and flow on color Doppler imaging. Superficial Great Saphenous Vein: No evidence of thrombus. Normal compressibility. Venous Reflux:  None. Other Findings:  None. LEFT LOWER EXTREMITY Common Femoral Vein: No evidence of thrombus. Normal compressibility, respiratory phasicity and response to augmentation. Saphenofemoral Junction: No evidence of thrombus. Normal compressibility and flow on color Doppler imaging. Profunda Femoral Vein: No evidence of thrombus. Normal compressibility and flow on color Doppler imaging. Femoral Vein: No evidence of thrombus. Normal compressibility, respiratory phasicity and response to augmentation. Popliteal Vein: No evidence of thrombus. Normal compressibility, respiratory phasicity and response to augmentation. Calf Veins: No evidence of thrombus. Normal compressibility and flow on color Doppler imaging. Superficial Great Saphenous Vein: No evidence of thrombus. Normal compressibility. Venous Reflux:  None. Other Findings: Atheromatous plaque throughout visualized arterial vessels with abundant eccentric soft plaque in the right popliteal artery. IMPRESSION: No evidence of deep venous thrombosis in either lower extremity. Signs of arterial vascular plaque with more pronounced changes incidentally noted in the right popliteal artery. Electronically Signed   By: Zetta Bills M.D.   On: 03/13/2019 19:03   DG Chest Port 1 View  Result Date: 03/13/2019 CLINICAL DATA:  Renal failure.  Hypertension and smoking. EXAM: PORTABLE CHEST 1 VIEW COMPARISON:  None. FINDINGS: The heart size and mediastinal contours are within normal limits. Both lungs are clear. The visualized skeletal structures are unremarkable. IMPRESSION: No active disease. Electronically Signed   By: Constance Holster M.D.   On: 03/13/2019 20:36   VAS [place] ABI WITH/WO TBI  Result Date: 03/15/2019 LOWER EXTREMITY DOPPLER STUDY  Performing  Technologist: Lita Mains RDMS, RVT  Examination Guidelines: A complete evaluation includes at minimum, Doppler waveform signals and systolic blood pressure reading at the level of bilateral brachial, anterior tibial, and posterior tibial arteries, when vessel segments are accessible. Bilateral testing is considered an integral part of a complete examination. Photoelectric Plethysmograph (PPG) waveforms and toe systolic pressure readings are included as required and additional duplex testing as needed. Limited examinations for reoccurring indications may be performed as noted.  ABI Findings: +--------+------------------+-----+---------+--------+ Right   Rt Pressure (mmHg)IndexWaveform Comment  +--------+------------------+-----+---------+--------+ [id number]                    triphasic         +--------+------------------+-----+---------+--------+ PTA     219               1.21 triphasic         +--------+------------------+-----+---------+--------+ DP  222               1.23 triphasic         +--------+------------------+-----+---------+--------+ +--------+------------------+-----+---------+-------+ Left    Lt Pressure (mmHg)IndexWaveform Comment +--------+------------------+-----+---------+-------+ [id number]                    triphasic        +--------+------------------+-----+---------+-------+ PTA     220               1.22 biphasic         +--------+------------------+-----+---------+-------+ DP      217               1.20 triphasic        +--------+------------------+-----+---------+-------+ +-------+-----------+-----------+------------+------------+ ABI/TBIToday's ABIToday's TBIPrevious ABIPrevious TBI +-------+-----------+-----------+------------+------------+ Right  1.23                                           +-------+-----------+-----------+------------+------------+ Left   1.22                                            +-------+-----------+-----------+------------+------------+  Summary: Right: Resting right ankle-brachial index is within normal range. No evidence of significant right lower extremity arterial disease. Left: Resting left ankle-brachial index is within normal range. No evidence of significant left lower extremity arterial disease.  *See table(s) above for measurements and observations.  Electronically signed by Harold Barban MD on 03/15/2019 at 5:23:12 PM.   Final    US BIOPSY (KIDNEY)  Result Date: 03/18/2019 INDICATION: 63 year old male with a history renal dysfunction referred for medical renal biopsy EXAM: IMAGE GUIDED MEDICAL RENAL BIOPSY MEDICATIONS: None. ANESTHESIA/SEDATION: Moderate (conscious) sedation was employed during this procedure. A total of Versed 1.0 mg and Fentanyl 25 mcg was administered intravenously. Moderate Sedation Time: 13 minutes. The Louis Butler's level of consciousness and vital signs were monitored continuously by radiology nursing throughout the procedure under my direct supervision. FLUOROSCOPY TIME:  None COMPLICATIONS: None PROCEDURE: Informed written consent was obtained from the Louis Butler after a thorough discussion of the procedural risks, benefits and alternatives. All questions were addressed. Maximal Sterile Barrier Technique was utilized including caps, mask, sterile gowns, sterile gloves, sterile drape, hand hygiene and skin antiseptic. A timeout was performed prior to the initiation of the procedure. Louis Butler was positioned prone position on the gantry table. Images were stored sent to PACs. Once the Louis Butler is prepped and draped in the usual sterile fashion, the skin and subcutaneous tissues overlying the left kidney were generously infiltrated 1% lidocaine for local anesthesia. Using ultrasound guidance, a 15 gauge guide needle was advanced into the lower cortex of the left kidney. Once we confirmed location of the needle tip, 2 separate 16 gauge core biopsy were achieved.  Two Gel-Foam pledgets were infused with a small amount of saline. The needle was removed. Final images were stored. The Louis Butler tolerated the procedure well and remained hemodynamically stable throughout. No complications were encountered and no significant blood loss encountered. IMPRESSION: Status post ultrasound-guided medical renal biopsy. Signed, Dulcy Fanny. Dellia Nims, RPVI [facility] Electronically Signed   By: Corrie Mckusick D.O.   On: 03/18/2019 09:57     Time Spent in minutes  [place] M.D  on 03/20/2019 at 4:41 PM  To page go to [email or web address] - password [facility]

## 2019-03-21 LAB — CBC
HCT: 22.2 % — ABNORMAL LOW (ref 39.0–52.0)
Hemoglobin: 7.4 g/dL — ABNORMAL LOW (ref 13.0–17.0)
MCH: 33.2 pg (ref 26.0–34.0)
MCHC: 33.3 g/dL (ref 30.0–36.0)
MCV: 99.6 fL (ref 80.0–100.0)
Platelets: 233 10*3/uL (ref 150–400)
RBC: 2.23 MIL/uL — ABNORMAL LOW (ref 4.22–5.81)
RDW: 14.5 % (ref 11.5–15.5)
WBC: 6.2 10*3/uL (ref 4.0–10.5)
nRBC: 0 % (ref 0.0–0.2)

## 2019-03-21 LAB — CHLORIDE, URINE, RANDOM: Chloride Urine: 32 mmol/L

## 2019-03-21 LAB — BASIC METABOLIC PANEL
Anion gap: 9 (ref 5–15)
BUN: 57 mg/dL — ABNORMAL HIGH (ref 8–23)
CO2: 22 mmol/L (ref 22–32)
Calcium: 8.1 mg/dL — ABNORMAL LOW (ref 8.9–10.3)
Chloride: 95 mmol/L — ABNORMAL LOW (ref 98–111)
Creatinine, Ser: 5.37 mg/dL — ABNORMAL HIGH (ref 0.61–1.24)
GFR calc Af Amer: 12 mL/min — ABNORMAL LOW (ref >60)
GFR calc non Af Amer: 10 mL/min — ABNORMAL LOW (ref >60)
Glucose, Bld: 103 mg/dL — ABNORMAL HIGH (ref 70–99)
Potassium: 4.4 mmol/L (ref 3.5–5.1)
Sodium: 126 mmol/L — ABNORMAL LOW (ref 135–145)

## 2019-03-21 LAB — OSMOLALITY, URINE: Osmolality, Ur: 172 mOsm/kg — ABNORMAL LOW (ref 300–900)

## 2019-03-21 LAB — GLUCOSE, CAPILLARY
Glucose-Capillary: 105 mg/dL — ABNORMAL HIGH (ref 70–99)
Glucose-Capillary: 123 mg/dL — ABNORMAL HIGH (ref 70–99)
Glucose-Capillary: 144 mg/dL — ABNORMAL HIGH (ref 70–99)
Glucose-Capillary: 127 mg/dL — ABNORMAL HIGH (ref 70–99)

## 2019-03-21 LAB — SODIUM, URINE, RANDOM: Sodium, Ur: 36 mmol/L

## 2019-03-21 LAB — OSMOLALITY: Osmolality: 288 mOsm/kg (ref 275–295)

## 2019-03-21 LAB — TSH: TSH: 3.227 u[IU]/mL (ref 0.350–4.500)

## 2019-03-21 MED ORDER — HYDROCORTISONE 1 % EX OINT
TOPICAL_OINTMENT | Freq: Two times a day (BID) | CUTANEOUS | Status: DC
  Administered 2019-03-21 (×2): via TOPICAL
  Filled 2019-03-21: qty 28
  Filled 2019-03-21: qty 28.35

## 2019-03-21 NOTE — Progress Notes (Signed)
[facility]  PROGRESS NOTE  Louis Butler NGE:[id number] DOB: Aug 21, 1955 DOA: 03/13/2019 PCP: Patient, Butler Pcp Per Admit date - 03/13/2019   Admitting Physician Shela Leff, MD  Outpatient Primary MD for the patient is Patient, Butler Pcp Per  LOS - 8 Brief Narrative   Louis Butler is a 63 y.o. year old male with medical history significant for  who presented on 03/13/2019 with HTN, diabetes, not followed by PCP for at least 2 years and on Butler home medications presented to [facility] due to lower extremity rash x2 to 4 weeks with bilateral foot swelling and pain right greater than left.  Admitted to [facility] with working diagnosis of AKI with creatinine of 5.24 (baseline 1.2, 11/2016), acute anemia ( hgb 9 , from previous baseline of 9) without bleeding and urinary retention and concern for UTI started on ceftriaxone. Patient was transferred to [facility] and admitted with working diagnosis of UTI, AKI with urinary retention.    Hospital course complicated by nonblanching purpuric rash concerning for vasculitic process in the setting of AKI.  Subjective  Louis Butler today continues to have mild pain in both feet. Still making urine after foley removed. Some irritation with peeing, Butler hematuria. Butler abdominal pain, Butler headache, Butler chest pain, Butler Nausea or vomiting, Butler new weakness, Butler Cough - SOB.   A & P    Nonoliguric AKI with urinary retention and bilateral hydronephrosis, stable.  Creatinine stable. Concern for ANCA negative vasculitis given Decrease in C3 and purpura.  Could also be progressive CKD in the setting of poorly controlled hypertension/diabetes.  Still has normal urine output without foley, not uremic, Butler hyperkalemia, Butler acute indication for dialysis. Peak creatinine 5.24.  (Baseline 1.2 on 11/2016) -Creatinine seems to be worsening -Still maintaining normal output -Currently awaiting preliminary report of IR guided biopsy (12/11) suspect diabetes/HTN could  also be contributing to to some element of chronicity.   -Nephrology recommends continuing to monitor function while awaiting biopsy results -Started Flomax here  Irritation with urination, improving.  Unclear if this is true dysuria, as patient had Foley catheter recently removed and still having some irritation from that point urinating quite well without any difficulty UA shows pyuria with rare bacteria.  Pyuria has been present since admission, likely related to AKI? -need urine culture, if significant CFU's will initiate treatment for UTI  Nonblanching, purpuric rash,stable.  Most concerning for vasculitic process in the setting of AKI, differential includes postinfectious GN, MPGN, or AIN.  C3 slightly decreased, hep C/anti-GBM/hep B/HIV/ANA unremarkable, -s/p IR guided biopsy, on 12/11, awaiting results  Non- Anion gap metabolic acidosis, improving in the setting of AKI due to above, improving after starting sodium bicarb - continue sodium bicarbonate 650 mg TID -monitor BMP  Normocytic anemia, stable likely due to kidney disease, likely has some element of advanced chronic in the setting of diabetic nephropathy.  Butler signs of active bleeding here.iron panel with wnl iron and decreased TIBC.  --ESA per nephrology -Monitor CBC  Mild euvolemic hyponatremia, worsening.  Likely chronic from beer potomania from chronic alcohol to decrease in free water excretion given reduced GFR, euvolemic.  H within normal limits -Follow urine electrolytes -monitor BMP, fluid restrict  Diabetes mellitus, A1c at goal.   -Closely monitor CBG with sliding scale insulin as needed, will likely need Metformin on discharge, monitor.  Hypertension, at goal. -Continue amlodipine 2.5 mg(started here)  Reported epistaxis.  Butler recurrent episodes here.hgb low as mentioned above, platelets  unremarkable. -Continue to closely monitor  Constipation. Butler nausea or emesis or abdominal pain. Butler BM in a week. Normal  appetite --BID senna docusate --BID miralax --monitor  Tobacco/alcohol dependence.  Emphasized cessation of both.  Reports binge drinking on the weekends patient agreeable, Butler current signs of withdrawal. -Monitor for alcohol withdrawal on CIWA protocol -Continue thiamine supplementation  Family Communication  : None at bedside  Code Status : Full code  Disposition Plan  : Close monitoring kidney function, metabolic acidosis, f/u renal biopsy results  Consults  : Nephrology  Procedures  : None  DVT Prophylaxis  :  SCDs   Lab Results  Component Value Date   PLT 233 03/21/2019    Diet :  Diet Order            Diet heart healthy/carb modified Room service appropriate? Yes; Fluid consistency: Thin; Fluid restriction: 1200 mL Fluid  Diet effective now               Inpatient Medications Scheduled Meds: . amLODipine  2.5 mg Oral Daily  . Chlorhexidine Gluconate Cloth  6 each Topical Daily  . darbepoetin (ARANESP) injection - NON-DIALYSIS  100 mcg Subcutaneous Q Thu-1800  . insulin aspart  0-5 Units Subcutaneous QHS  . insulin aspart  0-9 Units Subcutaneous TID WC  . polyethylene glycol  17 g Oral BID  . senna-docusate  2 tablet Oral BID  . sodium bicarbonate  650 mg Oral BID  . tamsulosin  0.4 mg Oral QPC supper  . thiamine  100 mg Oral Daily   Or  . thiamine  100 mg Intravenous Daily   Continuous Infusions: PRN Meds:.acetaminophen, fentaNYL (SUBLIMAZE) injection, hydrALAZINE, ondansetron **OR** ondansetron (ZOFRAN) IV, traMADol  Antibiotics  :   Anti-infectives (From admission, onward)   Start     Dose/Rate Route Frequency Ordered Stop   03/14/19 1900  cefTRIAXone (ROCEPHIN) 1 g in sodium chloride 0.9 % 100 mL IVPB     1 g 200 mL/hr over 30 Minutes Intravenous  Once 03/14/19 1001 03/14/19 2235   03/13/19 1945  cefTRIAXone (ROCEPHIN) 1 g in sodium chloride 0.9 % 100 mL IVPB     1 g 200 mL/hr over 30 Minutes Intravenous  Once 03/13/19 1932 03/13/19 2016        Objective   Vitals:   03/20/19 1830 03/20/19 2120 03/21/19 0506 03/21/19 0838  BP: (!) 153/120 123/70 122/67 115/64  Pulse: 85 83 77 85  Resp: 18 17 18 20   Temp: 98.4 F (36.9 C) 98.6 F (37 C) 98.4 F (36.9 C)   TempSrc: Oral Oral    SpO2: 99% 97% 96% 96%  Weight:  92 kg    Height:        SpO2: 96 % O2 Flow Rate (L/min): 2 L/min  Wt Readings from Last 3 Encounters:  03/20/19 92 kg  11/28/17 90.3 kg  02/06/17 90.7 kg     Intake/Output Summary (Last 24 hours) at 03/21/2019 1357 Last data filed at 03/21/2019 1696 Gross per 24 hour  Intake 600 ml  Output 2100 ml  Net -1500 ml    Physical Exam:  Awake Alert, Oriented X 3, flat affect  Butler new F.N deficits,  Springer.AT, Erythematous maculopapular rash to bilateral cheeks and upper neck Symmetrical Chest wall movement, Good air movement bilaterally, CTAB RRR,Butler Gallops,Rubs or new Murmurs,  Lower extremity purpuric rash unchanged          I have personally reviewed the following:   Data Reviewed:  CBC Recent Labs  Lab 03/17/19 0934 03/18/19 0332 03/19/19 0538 03/20/19 0447 03/21/19 0550  WBC 7.9 7.6 7.3 7.2 6.2  HGB 7.7* 7.9* 8.2* 7.7* 7.4*  HCT 22.8* 23.3* 24.6* 22.8* 22.2*  PLT 150 159 191 211 233  MCV 99.1 98.7 100.0 99.1 99.6  MCH 33.5 33.5 33.3 33.5 33.2  MCHC 33.8 33.9 33.3 33.8 33.3  RDW 14.6 14.4 14.6 14.6 14.5    Chemistries  Recent Labs  Lab 03/17/19 0438 03/18/19 0332 03/19/19 0538 03/20/19 0854 03/21/19 0550  NA 131* 131* 132* 127* 126*  K 4.3 4.8 4.4 4.4 4.4  CL 105 104 102 97* 95*  CO2 20* 20* 21* 22 22  GLUCOSE 103* 130* 117* 120* 103*  BUN 45* 49* 52* 56* 57*  CREATININE 4.55* 4.71* 4.54* 4.88* 5.37*  CALCIUM 7.7* 8.2* 8.0* 8.1* 8.1*   ------------------------------------------------------------------------------------------------------------------ Butler results for input(s): CHOL, HDL, LDLCALC, TRIG, CHOLHDL, LDLDIRECT in the last 72 hours.  Lab Results   Component Value Date   HGBA1C 6.2 (H) 03/14/2019   ------------------------------------------------------------------------------------------------------------------ Recent Labs    03/21/19 0807  TSH 3.227   ------------------------------------------------------------------------------------------------------------------ Butler results for input(s): VITAMINB12, FOLATE, FERRITIN, TIBC, IRON, RETICCTPCT in the last 72 hours.  Coagulation profile Recent Labs  Lab 03/15/19 0555 03/18/19 0332  INR 1.2 1.1    Butler results for input(s): DDIMER in the last 72 hours.  Cardiac Enzymes Butler results for input(s): CKMB, TROPONINI, MYOGLOBIN in the last 168 hours.  Invalid input(s): CK ------------------------------------------------------------------------------------------------------------------ Butler results found for: BNP  Micro Results Recent Results (from the past 240 hour(s))  SARS CORONAVIRUS 2 (TAT 6-24 HRS) Nasopharyngeal Nasopharyngeal Swab     Status: None   Collection Time: 03/13/19  7:11 PM   Specimen: Nasopharyngeal Swab  Result Value Ref Range Status   SARS Coronavirus 2 NEGATIVE NEGATIVE Final    Comment: (NOTE) SARS-CoV-2 target nucleic acids are NOT DETECTED. The SARS-CoV-2 RNA is generally detectable in upper and lower respiratory specimens during the acute phase of infection. Negative results do not preclude SARS-CoV-2 infection, do not rule out co-infections with other pathogens, and should not be used as the sole basis for treatment or other patient management decisions. Negative results must be combined with clinical observations, patient history, and epidemiological information. The expected result is Negative. Fact Sheet for Patients: SugarRoll.be Fact Sheet for Healthcare Providers: https://www.woods-mathews.com/ This test is not yet approved or cleared by the [place] FDA and  has been authorized for detection  and/or diagnosis of SARS-CoV-2 by FDA under an Emergency Use Authorization (EUA). This EUA will remain  in effect (meaning this test can be used) for the duration of the COVID-19 declaration under Section 56 4(b)(1) of the Act, 21 U.S.C. section 360bbb-3(b)(1), unless the authorization is terminated or revoked sooner. Performed at [facility], [place] [address], [place], [place] [address]     Radiology Reports CT ABDOMEN PELVIS WO CONTRAST  Result Date: 03/14/2019 CLINICAL DATA:  Renal failure.  Anemia.  Microscopic hematuria. EXAM: CT ABDOMEN AND PELVIS WITHOUT CONTRAST TECHNIQUE: Multidetector CT imaging of the abdomen and pelvis was performed following the standard protocol without IV contrast. COMPARISON:  03/13/2019 renal ultrasound.  Butler prior CT. FINDINGS: Lower chest: Emphysema. Normal heart size without pericardial or pleural effusion. Multivessel coronary artery atherosclerosis. Hepatobiliary: Normal noncontrast appearance of the liver. Small gallstones without acute cholecystitis or biliary duct dilatation. Pancreas: Normal, without mass or ductal dilatation. Spleen: Normal in size, without focal abnormality. Adrenals/Urinary Tract: Normal adrenal glands.  Mild renal cortical thinning bilaterally. Perirenal interstitial thickening. Exophytic interpolar right renal 3.8 cm lesion measures 48 HU on 31/3. Butler renal calculi. Mild bilateral hydroureteronephrosis. Hydroureter is followed to the level of the urinary bladder, without obstructive mass. There is a nonobstructive punctate mid right ureteric stone on 59/3 and sagittal image 85. Foley catheter within the urinary bladder. The bladder is thick walled with mild surrounding edema. Stomach/Bowel: Normal stomach, without wall thickening. Normal colon, appendix, and terminal ileum. Normal small bowel. Vascular/Lymphatic: Advanced aortic and branch vessel atherosclerosis. Mildly prominent abdominal retroperitoneal nodes are not pathologic by  size criteria and likely reactive. Prominent bilateral inguinal nodes are also identified and favored to be reactive. Reproductive: Normal prostate. Other: Butler significant free fluid. Musculoskeletal: Lumbosacral spondylosis. Minimal wedging of the T11 superior endplate. IMPRESSION: 1. Bilateral mild hydroureteronephrosis, followed to the level of the urinary bladder. The bladder is thick walled with surrounding pericystic edema in the setting of a Foley catheter. Considerations include cystitis and/or bladder outlet obstruction. Perirenal interstitial thickening is nonspecific but likely related to the clinical history of renal insufficiency. 2. 3 mm mid right ureteric nonobstructive stone. 3. Right renal mass is technically indeterminate on noncontrast exam. Most consistent with a cyst on yesterday's ultrasound. Consider surveillance with renal ultrasound at 6 months and attention to the right renal lesion. 4. Coronary artery atherosclerosis. Aortic Atherosclerosis (ICD10-I70.0). Emphysema (ICD10-J43.9). 5. Cholelithiasis. Electronically Signed   By: Abigail Miyamoto M.D.   On: 03/14/2019 19:44   US Renal  Result Date: 03/13/2019 CLINICAL DATA:  63 year old male with acute renal failure. History of diabetes. EXAM: RENAL / URINARY TRACT ULTRASOUND COMPLETE COMPARISON:  None. FINDINGS: Evaluation is limited as the patient was not able to cooperate with exam and due to portable technique. Right Kidney: Renal measurements: 12.6 x 4.4 x 4.1 cm = volume: 117 mL. There is moderate parenchyma atrophy. There is increased renal parenchymal echogenicity. There is a 3.3 x 2.4 x 3.1 cm upper pole cyst. There is moderate right hydronephrosis. Butler shadowing stone. Left Kidney: Renal measurements: 12.8 x 4.8 x 5.1 cm = volume: 164 ML. There is mild parenchyma atrophy and cortical thinning. There is increased renal parenchymal echogenicity. Mild-to-moderate hydronephrosis. Butler shadowing stone. Bladder: Not well visualized. Other:  None. IMPRESSION: 1. Increased renal parenchyma echogenicity in keeping with chronic kidney disease. 2. Bilateral hydronephrosis, right greater left. Butler shadowing stone. Electronically Signed   By: Anner Crete M.D.   On: 03/13/2019 20:15   US Venous Img Lower Bilateral  Result Date: 03/13/2019 CLINICAL DATA:  Leg swelling right greater than left, calf pain and bilateral foot pain EXAM: BILATERAL LOWER EXTREMITY VENOUS DOPPLER ULTRASOUND TECHNIQUE: Gray-scale sonography with graded compression, as well as color Doppler and duplex ultrasound were performed to evaluate the lower extremity deep venous systems from the level of the common femoral vein and including the common femoral, femoral, profunda femoral, popliteal and calf veins including the posterior tibial, peroneal and gastrocnemius veins when visible. The superficial great saphenous vein was also interrogated. Spectral Doppler was utilized to evaluate flow at rest and with distal augmentation maneuvers in the common femoral, femoral and popliteal veins. COMPARISON:  None FINDINGS: RIGHT LOWER EXTREMITY Common Femoral Vein: Butler evidence of thrombus. Normal compressibility, respiratory phasicity and response to augmentation. Saphenofemoral Junction: Butler evidence of thrombus. Normal compressibility and flow on color Doppler imaging. Profunda Femoral Vein: Butler evidence of thrombus. Normal compressibility and flow on color Doppler imaging. Femoral Vein: Butler evidence of thrombus. Normal compressibility, respiratory phasicity  and response to augmentation. Popliteal Vein: Butler evidence of thrombus. Normal compressibility, respiratory phasicity and response to augmentation. Calf Veins: Butler evidence of thrombus. Normal compressibility and flow on color Doppler imaging. Superficial Great Saphenous Vein: Butler evidence of thrombus. Normal compressibility. Venous Reflux:  None. Other Findings:  None. LEFT LOWER EXTREMITY Common Femoral Vein: Butler evidence of thrombus. Normal  compressibility, respiratory phasicity and response to augmentation. Saphenofemoral Junction: Butler evidence of thrombus. Normal compressibility and flow on color Doppler imaging. Profunda Femoral Vein: Butler evidence of thrombus. Normal compressibility and flow on color Doppler imaging. Femoral Vein: Butler evidence of thrombus. Normal compressibility, respiratory phasicity and response to augmentation. Popliteal Vein: Butler evidence of thrombus. Normal compressibility, respiratory phasicity and response to augmentation. Calf Veins: Butler evidence of thrombus. Normal compressibility and flow on color Doppler imaging. Superficial Great Saphenous Vein: Butler evidence of thrombus. Normal compressibility. Venous Reflux:  None. Other Findings: Atheromatous plaque throughout visualized arterial vessels with abundant eccentric soft plaque in the right popliteal artery. IMPRESSION: Butler evidence of deep venous thrombosis in either lower extremity. Signs of arterial vascular plaque with more pronounced changes incidentally noted in the right popliteal artery. Electronically Signed   By: Zetta Bills M.D.   On: 03/13/2019 19:03   DG Chest Port 1 View  Result Date: 03/13/2019 CLINICAL DATA:  Renal failure.  Hypertension and smoking. EXAM: PORTABLE CHEST 1 VIEW COMPARISON:  None. FINDINGS: The heart size and mediastinal contours are within normal limits. Both lungs are clear. The visualized skeletal structures are unremarkable. IMPRESSION: Butler active disease. Electronically Signed   By: Constance Holster M.D.   On: 03/13/2019 20:36   VAS [place] ABI WITH/WO TBI  Result Date: 03/15/2019 LOWER EXTREMITY DOPPLER STUDY  Performing Technologist: Lita Mains RDMS, RVT  Examination Guidelines: A complete evaluation includes at minimum, Doppler waveform signals and systolic blood pressure reading at the level of bilateral brachial, anterior tibial, and posterior tibial arteries, when vessel segments are accessible. Bilateral testing is considered  an integral part of a complete examination. Photoelectric Plethysmograph (PPG) waveforms and toe systolic pressure readings are included as required and additional duplex testing as needed. Limited examinations for reoccurring indications may be performed as noted.  ABI Findings: +--------+------------------+-----+---------+--------+ Right   Rt Pressure (mmHg)IndexWaveform Comment  +--------+------------------+-----+---------+--------+ [id number]                    triphasic         +--------+------------------+-----+---------+--------+ PTA     219               1.21 triphasic         +--------+------------------+-----+---------+--------+ DP      222               1.23 triphasic         +--------+------------------+-----+---------+--------+ +--------+------------------+-----+---------+-------+ Left    Lt Pressure (mmHg)IndexWaveform Comment +--------+------------------+-----+---------+-------+ [id number]                    triphasic        +--------+------------------+-----+---------+-------+ PTA     220               1.22 biphasic         +--------+------------------+-----+---------+-------+ DP      217               1.20 triphasic        +--------+------------------+-----+---------+-------+ +-------+-----------+-----------+------------+------------+ ABI/TBIToday's ABIToday's TBIPrevious ABIPrevious TBI +-------+-----------+-----------+------------+------------+ Right  1.23                                           +-------+-----------+-----------+------------+------------+  Left   1.22                                           +-------+-----------+-----------+------------+------------+  Summary: Right: Resting right ankle-brachial index is within normal range. Butler evidence of significant right lower extremity arterial disease. Left: Resting left ankle-brachial index is within normal range. Butler evidence of significant left lower extremity arterial  disease.  *See table(s) above for measurements and observations.  Electronically signed by Harold Barban MD on 03/15/2019 at 5:23:12 PM.   Final    US BIOPSY (KIDNEY)  Result Date: 03/18/2019 INDICATION: 63 year old male with a history renal dysfunction referred for medical renal biopsy EXAM: IMAGE GUIDED MEDICAL RENAL BIOPSY MEDICATIONS: None. ANESTHESIA/SEDATION: Moderate (conscious) sedation was employed during this procedure. A total of Versed 1.0 mg and Fentanyl 25 mcg was administered intravenously. Moderate Sedation Time: 13 minutes. The patient's level of consciousness and vital signs were monitored continuously by radiology nursing throughout the procedure under my direct supervision. FLUOROSCOPY TIME:  None COMPLICATIONS: None PROCEDURE: Informed written consent was obtained from the patient after a thorough discussion of the procedural risks, benefits and alternatives. All questions were addressed. Maximal Sterile Barrier Technique was utilized including caps, mask, sterile gowns, sterile gloves, sterile drape, hand hygiene and skin antiseptic. A timeout was performed prior to the initiation of the procedure. Patient was positioned prone position on the gantry table. Images were stored sent to PACs. Once the patient is prepped and draped in the usual sterile fashion, the skin and subcutaneous tissues overlying the left kidney were generously infiltrated 1% lidocaine for local anesthesia. Using ultrasound guidance, a 15 gauge guide needle was advanced into the lower cortex of the left kidney. Once we confirmed location of the needle tip, 2 separate 16 gauge core biopsy were achieved. Two Gel-Foam pledgets were infused with a small amount of saline. The needle was removed. Final images were stored. The patient tolerated the procedure well and remained hemodynamically stable throughout. Butler complications were encountered and Butler significant blood loss encountered. IMPRESSION: Status post ultrasound-guided  medical renal biopsy. Signed, Dulcy Fanny. Dellia Nims, RPVI [facility] Electronically Signed   By: Corrie Mckusick D.O.   On: 03/18/2019 09:57     Time Spent in minutes  30     Desiree Hane M.D on 03/21/2019 at 1:57 PM  To page go to [email or web address] - password [facility]

## 2019-03-21 NOTE — Plan of Care (Signed)
  Problem: Elimination: Goal: Will not experience complications related to bowel motility Outcome: Progressing Note: Pt reports having multiple BM's over the weekend resulting in him refusing his am stool softeners during my shift.    Problem: Elimination: Goal: Will not experience complications related to urinary retention Outcome: Completed/Met

## 2019-03-21 NOTE — Progress Notes (Signed)
[facility] NEPHROLOGY PROGRESS NOTE  Assessment/ Plan: Pt is a 63 y.o. yo male with history of HTN, DM, admitted with bilateral lower extremity rash, AKI with a creatinine level of 5.04.  #Acute kidney injury versus progressive CKD: Nonoliguric.  UA with RBC and serology test consistent with negative ANA, ANCA, dsDNA Ab, MPO, C4, kappa lambda ratio.  C3 level is mildly reduced.  Kidney US with mild bilateral hydronephrosis right renal cyst.  Probably need a urology evaluation.  Status post kidney biopsy as evaluation for underlying GN.  We will try to get the preliminary result.  Return level is worsening.  Not uremic currently.  No indication for dialysis.  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  Recommend fluid restriction to the patient.  #Metabolic acidosis: Improving with sodium bicarbonate.  #Hypertension: On amlodipine.  Pressure acceptable.  Subjective: Seen and examined at bedside.  Urine output of 2850 cc.  Denied nausea, vomiting, chest pain, shortness of breath.  Feels good. Objective Vital signs in last 24 hours: Vitals:   03/20/19 1830 03/20/19 2120 03/21/19 0506 03/21/19 0838  BP: (!) 153/120 123/70 122/67 115/64  Pulse: 85 83 77 85  Resp: 18 17 18 20   Temp: 98.4 F (36.9 C) 98.6 F (37 C) 98.4 F (36.9 C)   TempSrc: Oral Oral    SpO2: 99% 97% 96% 96%  Weight:  92 kg    Height:       Weight change:   Intake/Output Summary (Last 24 hours) at 03/21/2019 1259 Last data filed at 03/21/2019 0942 Gross per 24 hour  Intake 600 ml  Output 2100 ml  Net -1500 ml       Labs: Basic Metabolic Panel: Recent Labs  Lab 03/17/19 0438 03/19/19 0538 03/20/19 0854 03/21/19 0550  NA 131* 132* 127* 126*  K 4.3 4.4 4.4 4.4  CL 105 102 97* 95*  CO2 20* 21* 22 22  GLUCOSE 103* 117* 120* 103*  BUN 45* 52* 56* 57*  CREATININE 4.55* 4.54* 4.88* 5.37*   CALCIUM 7.7* 8.0* 8.1* 8.1*  PHOS 3.2  --   --   --    Liver Function Tests: Recent Labs  Lab 03/17/19 0438  ALBUMIN 2.0*   No results for input(s): LIPASE, AMYLASE in the last 168 hours. No results for input(s): AMMONIA in the last 168 hours. CBC: Recent Labs  Lab 03/17/19 0934 03/18/19 0332 03/19/19 0538 03/20/19 0447 03/21/19 0550  WBC 7.9 7.6 7.3 7.2 6.2  HGB 7.7* 7.9* 8.2* 7.7* 7.4*  HCT 22.8* 23.3* 24.6* 22.8* 22.2*  MCV 99.1 98.7 100.0 99.1 99.6  PLT 150 159 191 211 233   Cardiac Enzymes: No results for input(s): CKTOTAL, CKMB, CKMBINDEX, TROPONINI in the last 168 hours. CBG: Recent Labs  Lab 03/20/19 1127 03/20/19 1615 03/20/19 2120 03/21/19 0703 03/21/19 1126  GLUCAP 149* 124* 170* 105* 123*    Iron Studies: No results for input(s): IRON, TIBC, TRANSFERRIN, FERRITIN in the last 72 hours. Studies/Results: No results found.  Medications: Infusions:   Scheduled Medications: . amLODipine  2.5 mg Oral Daily  . Chlorhexidine Gluconate Cloth  6 each Topical Daily  . darbepoetin (ARANESP) injection - NON-DIALYSIS  100 mcg Subcutaneous Q Thu-1800  . insulin aspart  0-5 Units Subcutaneous QHS  . insulin aspart  0-9 Units Subcutaneous TID WC  . polyethylene glycol  17 g Oral BID  . senna-docusate  2  tablet Oral BID  . sodium bicarbonate  650 mg Oral BID  . tamsulosin  0.4 mg Oral QPC supper  . thiamine  100 mg Oral Daily   Or  . thiamine  100 mg Intravenous Daily    have reviewed scheduled and prn medications.  Physical Exam: General:NAD, comfortable Heart:RRR, s1s2 nl Lungs:clear b/l, no crackle Abdomen:soft, Non-tender, non-distended Extremities:No edema, lower extremity rash around ankle area. Neurology: Alert, awake and following commands  Louis Butler [name] 03/21/2019,12:59 PM  LOS: 8 days  Pager: [phone]

## 2019-03-22 LAB — RENAL FUNCTION PANEL
Albumin: 2.2 g/dL — ABNORMAL LOW (ref 3.5–5.0)
Anion gap: 9 (ref 5–15)
BUN: 63 mg/dL — ABNORMAL HIGH (ref 8–23)
CO2: 24 mmol/L (ref 22–32)
Calcium: 8.3 mg/dL — ABNORMAL LOW (ref 8.9–10.3)
Chloride: 98 mmol/L (ref 98–111)
Creatinine, Ser: 5.67 mg/dL — ABNORMAL HIGH (ref 0.61–1.24)
GFR calc Af Amer: 11 mL/min — ABNORMAL LOW (ref >60)
GFR calc non Af Amer: 10 mL/min — ABNORMAL LOW (ref >60)
Glucose, Bld: 112 mg/dL — ABNORMAL HIGH (ref 70–99)
Phosphorus: 3.9 mg/dL (ref 2.5–4.6)
Potassium: 4.6 mmol/L (ref 3.5–5.1)
Sodium: 131 mmol/L — ABNORMAL LOW (ref 135–145)

## 2019-03-22 LAB — GLUCOSE, CAPILLARY
Glucose-Capillary: 98 mg/dL (ref 70–99)
Glucose-Capillary: 121 mg/dL — ABNORMAL HIGH (ref 70–99)
Glucose-Capillary: 220 mg/dL — ABNORMAL HIGH (ref 70–99)
Glucose-Capillary: 365 mg/dL — ABNORMAL HIGH (ref 70–99)

## 2019-03-22 LAB — CBC
HCT: 22.8 % — ABNORMAL LOW (ref 39.0–52.0)
Hemoglobin: 7.6 g/dL — ABNORMAL LOW (ref 13.0–17.0)
MCH: 33.2 pg (ref 26.0–34.0)
MCHC: 33.3 g/dL (ref 30.0–36.0)
MCV: 99.6 fL (ref 80.0–100.0)
Platelets: 278 10*3/uL (ref 150–400)
RBC: 2.29 MIL/uL — ABNORMAL LOW (ref 4.22–5.81)
RDW: 14.5 % (ref 11.5–15.5)
WBC: 6 10*3/uL (ref 4.0–10.5)
nRBC: 0 % (ref 0.0–0.2)

## 2019-03-22 MED ORDER — METHYLPREDNISOLONE SODIUM SUCC 125 MG IJ SOLR
125.0000 mg | Freq: Once | INTRAMUSCULAR | Status: AC
  Administered 2019-03-22: 125 mg via INTRAVENOUS
  Filled 2019-03-22: qty 2

## 2019-03-22 MED ORDER — PREDNISONE 50 MG PO TABS
50.0000 mg | ORAL_TABLET | Freq: Every day | ORAL | Status: DC
  Administered 2019-03-23: 50 mg via ORAL
  Filled 2019-03-22: qty 1

## 2019-03-22 MED ORDER — HYDROCORTISONE 1 % EX OINT
TOPICAL_OINTMENT | Freq: Two times a day (BID) | CUTANEOUS | Status: DC
  Administered 2019-03-27: 1 via TOPICAL
  Filled 2019-03-22: qty 28

## 2019-03-22 NOTE — Progress Notes (Signed)
[facility]  PROGRESS NOTE  Louis Butler YKD:[id number] DOB: Sep 15, 1955 DOA: 03/13/2019 PCP: Patient, [name] Admit date - 03/13/2019   Admitting Physician Shela Leff, MD  Outpatient Primary MD for the patient is Patient, [name]  LOS - 9 Brief Narrative   Louis Butler is a 63 y.o. year old male with medical history significant for  who presented on 03/13/2019 with HTN, diabetes, not followed by PCP for at least 2 years and on no home medications presented to [facility] due to lower extremity rash x2 to 4 weeks with bilateral foot swelling and pain right greater than left.  Admitted to [facility] with working diagnosis of AKI with creatinine of 5.24 (baseline 1.2, 11/2016), acute anemia ( hgb 9 , from previous baseline of 9) without bleeding and urinary retention and concern for UTI started on ceftriaxone. Patient was transferred to [facility] and admitted with working diagnosis of UTI, AKI with urinary retention.    Hospital course complicated by nonblanching purpuric rash concerning for vasculitic process in the setting of AKI.  Patient underwent IR guided biopsy on 12/11  Subjective  Louis Butler today mild discomfort in both feet, some occasional itching, still having normal urine output, denies any dysuria.   A & P    Nonoliguric AKI with urinary retention and bilateral hydronephrosis, or any worsening.  Acute tubular interstitial nephritis some element diabetic CKD based off preliminary biopsy results .  Still has normal urine output without foley, not uremic, no hyperkalemia, normal volume status, no acute indication for dialysis. Peak creatinine 5.6 on 12/15 (Baseline 1.2 on 11/2016) -Creatinine seems to be worsening -Still maintaining normal output -Nephrology recommends IV Solu-Medrol dose x1 today followed by prednisone on 12/16 -Continue to monitor BMP -Case management consulted to establish PCP, will need close follow-up with BMP when close to  discharge -Started Flomax here  Irritation with urination, improving.  Unclear if this is true dysuria, as patient had Foley catheter recently removed and still having some irritation from that point urinating quite well without any difficulty UA shows pyuria with rare bacteria.  Pyuria has been present since admission, likely related to AKI? -need urine culture, if significant CFU's will initiate treatment for UTI  Nonblanching, purpuric rash,stable.  Most concerning for vasculitic process in the setting of AKI, differential includes postinfectious GN, MPGN, or AIN.  C3 slightly decreased, hep C/anti-GBM/hep B/HIV/ANA unremarkable, -s/p IR guided biopsy, on 12/11 -rash seems to be improving  Non- Anion gap metabolic acidosis, improving in the setting of AKI due to above, improving after starting sodium bicarb - continue sodium bicarbonate 650 mg TID -monitor BMP  Normocytic anemia, stable likely due to kidney disease, likely has some element of advanced chronic in the setting of diabetic nephropathy.  No signs of active bleeding here.iron panel with wnl iron and decreased TIBC.  --ESA per nephrology -Monitor CBC  Mild euvolemic hyponatremia, improving.  Likely chronic from beer potomania from chronic alcohol to decrease in free water excretion given reduced GFR, euvolemic.    -monitor BMP, fluid restrict  Diabetes mellitus, A1c at goal.   -Closely monitor CBG with sliding scale insulin as needed, will likely need Metformin on discharge, monitor.  Hypertension, at goal. -Continue amlodipine 2.5 mg(started here)  Reported epistaxis.  No recurrent episodes here.hgb low as mentioned above, platelets unremarkable. -Continue to closely monitor  Constipation, resolved. No nausea or emesis or abdominal pain.  --DC scheduled bowel regimen-  Tobacco/alcohol dependence.  Emphasized cessation of both.  Reports binge drinking on the weekends patient agreeable, no current signs of  withdrawal. -Monitor for alcohol withdrawal on CIWA protocol -Continue thiamine supplementation  Family Communication  : None at bedside  Code Status : Full code  Disposition Plan  : Close monitoring kidney function while on IV steroids, plan to start oral prednisone on 12/16  Consults  : Nephrology  Procedures  : IR guided biopsy, 12/11  DVT Prophylaxis  :  SCDs   Lab Results  Component Value Date   PLT 278 03/22/2019    Diet :  Diet Order            Diet heart healthy/carb modified Room service appropriate? Yes; Fluid consistency: Thin; Fluid restriction: 1200 mL Fluid  Diet effective now               Inpatient Medications Scheduled Meds: . amLODipine  2.5 mg Oral Daily  . Chlorhexidine Gluconate Cloth  6 each Topical Daily  . darbepoetin (ARANESP) injection - NON-DIALYSIS  100 mcg Subcutaneous Q Thu-1800  . hydrocortisone   Topical BID  . hydrocortisone   Topical BID  . insulin aspart  0-5 Units Subcutaneous QHS  . insulin aspart  0-9 Units Subcutaneous TID WC  . polyethylene glycol  17 g Oral BID  . [START ON 03/23/2019] predniSONE  50 mg Oral Q breakfast  . senna-docusate  2 tablet Oral BID  . sodium bicarbonate  650 mg Oral BID  . tamsulosin  0.4 mg Oral QPC supper  . thiamine  100 mg Oral Daily   Or  . thiamine  100 mg Intravenous Daily   Continuous Infusions: PRN Meds:.acetaminophen, fentaNYL (SUBLIMAZE) injection, hydrALAZINE, ondansetron **OR** ondansetron (ZOFRAN) IV, traMADol  Antibiotics  :   Anti-infectives (From admission, onward)   Start     Dose/Rate Route Frequency Ordered Stop   03/14/19 1900  cefTRIAXone (ROCEPHIN) 1 g in sodium chloride 0.9 % 100 mL IVPB     1 g 200 mL/hr over 30 Minutes Intravenous  Once 03/14/19 1001 03/14/19 2235   03/13/19 1945  cefTRIAXone (ROCEPHIN) 1 g in sodium chloride 0.9 % 100 mL IVPB     1 g 200 mL/hr over 30 Minutes Intravenous  Once 03/13/19 1932 03/13/19 2016       Objective   Vitals:   03/21/19  1630 03/21/19 2138 03/22/19 0548 03/22/19 0900  BP: 130/69 (!) 153/71 134/69 121/66  Pulse: 79 80 81 83  Resp: 18 12 20 18   Temp: 98 F (36.7 C) 99 F (37.2 C) 98.6 F (37 C) 98.7 F (37.1 C)  TempSrc: Oral Oral Oral Oral  SpO2: 100% 99% 98% 97%  Weight:      Height:        SpO2: 97 % O2 Flow Rate (L/min): 2 L/min  Wt Readings from Last 3 Encounters:  03/20/19 92 kg  11/28/17 90.3 kg  02/06/17 90.7 kg     Intake/Output Summary (Last 24 hours) at 03/22/2019 1629 Last data filed at 03/22/2019 1300 Gross per 24 hour  Intake 1300 ml  Output 300 ml  Net 1000 ml    Physical Exam:  Awake Alert, Oriented X 3, flat affect  No new F.N deficits,  Worth.AT, Erythematous maculopapular rash to bilateral cheeks and upper neck Symmetrical Chest wall movement, Good air movement bilaterally, CTAB RRR,No Gallops,Rubs or new Murmurs,  Lower extremity purpuric rash seems to be improving Right lateral foot, 12/15   12/15, left lateral foot  Heel of left foot, 12/15            I have personally reviewed the following:   Data Reviewed:  CBC Recent Labs  Lab 03/18/19 0332 03/19/19 0538 03/20/19 0447 03/21/19 0550 03/22/19 0540  WBC 7.6 7.3 7.2 6.2 6.0  HGB 7.9* 8.2* 7.7* 7.4* 7.6*  HCT 23.3* 24.6* 22.8* 22.2* 22.8*  PLT 159 191 211 233 278  MCV 98.7 100.0 99.1 99.6 99.6  MCH 33.5 33.3 33.5 33.2 33.2  MCHC 33.9 33.3 33.8 33.3 33.3  RDW 14.4 14.6 14.6 14.5 14.5    Chemistries  Recent Labs  Lab 03/18/19 0332 03/19/19 0538 03/20/19 0854 03/21/19 0550 03/22/19 0540  NA 131* 132* 127* 126* 131*  K 4.8 4.4 4.4 4.4 4.6  CL 104 102 97* 95* 98  CO2 20* 21* 22 22 24   GLUCOSE 130* 117* 120* 103* 112*  BUN 49* 52* 56* 57* 63*  CREATININE 4.71* 4.54* 4.88* 5.37* 5.67*  CALCIUM 8.2* 8.0* 8.1* 8.1* 8.3*   ------------------------------------------------------------------------------------------------------------------ No results for input(s): CHOL, HDL,  LDLCALC, TRIG, CHOLHDL, LDLDIRECT in the last 72 hours.  Lab Results  Component Value Date   HGBA1C 6.2 (H) 03/14/2019   ------------------------------------------------------------------------------------------------------------------ Recent Labs    03/21/19 0807  TSH 3.227   ------------------------------------------------------------------------------------------------------------------ No results for input(s): VITAMINB12, FOLATE, FERRITIN, TIBC, IRON, RETICCTPCT in the last 72 hours.  Coagulation profile Recent Labs  Lab 03/18/19 0332  INR 1.1    No results for input(s): DDIMER in the last 72 hours.  Cardiac Enzymes No results for input(s): CKMB, TROPONINI, MYOGLOBIN in the last 168 hours.  Invalid input(s): CK ------------------------------------------------------------------------------------------------------------------ No results found for: BNP  Micro Results Recent Results (from the past 240 hour(s))  SARS CORONAVIRUS 2 (TAT 6-24 HRS) Nasopharyngeal Nasopharyngeal Swab     Status: None   Collection Time: 03/13/19  7:11 PM   Specimen: Nasopharyngeal Swab  Result Value Ref Range Status   SARS Coronavirus 2 NEGATIVE NEGATIVE Final    Comment: (NOTE) SARS-CoV-2 target nucleic acids are NOT DETECTED. The SARS-CoV-2 RNA is generally detectable in upper and lower respiratory specimens during the acute phase of infection. Negative results do not preclude SARS-CoV-2 infection, do not rule out co-infections with other pathogens, and should not be used as the sole basis for treatment or other patient management decisions. Negative results must be combined with clinical observations, patient history, and epidemiological information. The expected result is Negative. Fact Sheet for Patients: SugarRoll.be Fact Sheet for Healthcare Providers: https://www.woods-mathews.com/ This test is not yet approved or cleared by the [place] FDA and  has been authorized for detection and/or diagnosis of SARS-CoV-2 by FDA under an Emergency Use Authorization (EUA). This EUA will remain  in effect (meaning this test can be used) for the duration of the COVID-19 declaration under Section 56 4(b)(1) of the Act, 21 U.S.C. section 360bbb-3(b)(1), unless the authorization is terminated or revoked sooner. Performed at [facility], [address], [place], [place] [address]     Radiology Reports CT ABDOMEN PELVIS WO CONTRAST  Result Date: 03/14/2019 CLINICAL DATA:  Renal failure.  Anemia.  Microscopic hematuria. EXAM: CT ABDOMEN AND PELVIS WITHOUT CONTRAST TECHNIQUE: Multidetector CT imaging of the abdomen and pelvis was performed following the standard protocol without IV contrast. COMPARISON:  03/13/2019 renal ultrasound.  No prior CT. FINDINGS: Lower chest: Emphysema. Normal heart size without pericardial or pleural effusion. Multivessel coronary artery atherosclerosis. Hepatobiliary: Normal noncontrast appearance of the liver. Small gallstones without acute  cholecystitis or biliary duct dilatation. Pancreas: Normal, without mass or ductal dilatation. Spleen: Normal in size, without focal abnormality. Adrenals/Urinary Tract: Normal adrenal glands. Mild renal cortical thinning bilaterally. Perirenal interstitial thickening. Exophytic interpolar right renal 3.8 cm lesion measures 48 HU on 31/3. No renal calculi. Mild bilateral hydroureteronephrosis. Hydroureter is followed to the level of the urinary bladder, without obstructive mass. There is a nonobstructive punctate mid right ureteric stone on 59/3 and sagittal image 85. Foley catheter within the urinary bladder. The bladder is thick walled with mild surrounding edema. Stomach/Bowel: Normal stomach, without wall thickening. Normal colon, appendix, and terminal ileum. Normal small bowel. Vascular/Lymphatic: Advanced aortic and branch vessel atherosclerosis. Mildly prominent  abdominal retroperitoneal nodes are not pathologic by size criteria and likely reactive. Prominent bilateral inguinal nodes are also identified and favored to be reactive. Reproductive: Normal prostate. Other: No significant free fluid. Musculoskeletal: Lumbosacral spondylosis. Minimal wedging of the T11 superior endplate. IMPRESSION: 1. Bilateral mild hydroureteronephrosis, followed to the level of the urinary bladder. The bladder is thick walled with surrounding pericystic edema in the setting of a Foley catheter. Considerations include cystitis and/or bladder outlet obstruction. Perirenal interstitial thickening is nonspecific but likely related to the clinical history of renal insufficiency. 2. 3 mm mid right ureteric nonobstructive stone. 3. Right renal mass is technically indeterminate on noncontrast exam. Most consistent with a cyst on yesterday's ultrasound. Consider surveillance with renal ultrasound at 6 months and attention to the right renal lesion. 4. Coronary artery atherosclerosis. Aortic Atherosclerosis (ICD10-I70.0). Emphysema (ICD10-J43.9). 5. Cholelithiasis. Electronically Signed   By: Abigail Miyamoto M.D.   On: 03/14/2019 19:44   US Renal  Result Date: 03/13/2019 CLINICAL DATA:  63 year old male with acute renal failure. History of diabetes. EXAM: RENAL / URINARY TRACT ULTRASOUND COMPLETE COMPARISON:  None. FINDINGS: Evaluation is limited as the patient was not able to cooperate with exam and due to portable technique. Right Kidney: Renal measurements: 12.6 x 4.4 x 4.1 cm = volume: 117 mL. There is moderate parenchyma atrophy. There is increased renal parenchymal echogenicity. There is a 3.3 x 2.4 x 3.1 cm upper pole cyst. There is moderate right hydronephrosis. No shadowing stone. Left Kidney: Renal measurements: 12.8 x 4.8 x 5.1 cm = volume: 164 ML. There is mild parenchyma atrophy and cortical thinning. There is increased renal parenchymal echogenicity. Mild-to-moderate hydronephrosis. No  shadowing stone. Bladder: Not well visualized. Other: None. IMPRESSION: 1. Increased renal parenchyma echogenicity in keeping with chronic kidney disease. 2. Bilateral hydronephrosis, right greater left. No shadowing stone. Electronically Signed   By: Anner Crete M.D.   On: 03/13/2019 20:15   US Venous Img Lower Bilateral  Result Date: 03/13/2019 CLINICAL DATA:  Leg swelling right greater than left, calf pain and bilateral foot pain EXAM: BILATERAL LOWER EXTREMITY VENOUS DOPPLER ULTRASOUND TECHNIQUE: Gray-scale sonography with graded compression, as well as color Doppler and duplex ultrasound were performed to evaluate the lower extremity deep venous systems from the level of the common femoral vein and including the common femoral, femoral, profunda femoral, popliteal and calf veins including the posterior tibial, peroneal and gastrocnemius veins when visible. The superficial great saphenous vein was also interrogated. Spectral Doppler was utilized to evaluate flow at rest and with distal augmentation maneuvers in the common femoral, femoral and popliteal veins. COMPARISON:  None FINDINGS: RIGHT LOWER EXTREMITY Common Femoral Vein: No evidence of thrombus. Normal compressibility, respiratory phasicity and response to augmentation. Saphenofemoral Junction: No evidence of thrombus. Normal compressibility and flow on color Doppler imaging. Profunda  Femoral Vein: No evidence of thrombus. Normal compressibility and flow on color Doppler imaging. Femoral Vein: No evidence of thrombus. Normal compressibility, respiratory phasicity and response to augmentation. Popliteal Vein: No evidence of thrombus. Normal compressibility, respiratory phasicity and response to augmentation. Calf Veins: No evidence of thrombus. Normal compressibility and flow on color Doppler imaging. Superficial Great Saphenous Vein: No evidence of thrombus. Normal compressibility. Venous Reflux:  None. Other Findings:  None. LEFT LOWER  EXTREMITY Common Femoral Vein: No evidence of thrombus. Normal compressibility, respiratory phasicity and response to augmentation. Saphenofemoral Junction: No evidence of thrombus. Normal compressibility and flow on color Doppler imaging. Profunda Femoral Vein: No evidence of thrombus. Normal compressibility and flow on color Doppler imaging. Femoral Vein: No evidence of thrombus. Normal compressibility, respiratory phasicity and response to augmentation. Popliteal Vein: No evidence of thrombus. Normal compressibility, respiratory phasicity and response to augmentation. Calf Veins: No evidence of thrombus. Normal compressibility and flow on color Doppler imaging. Superficial Great Saphenous Vein: No evidence of thrombus. Normal compressibility. Venous Reflux:  None. Other Findings: Atheromatous plaque throughout visualized arterial vessels with abundant eccentric soft plaque in the right popliteal artery. IMPRESSION: No evidence of deep venous thrombosis in either lower extremity. Signs of arterial vascular plaque with more pronounced changes incidentally noted in the right popliteal artery. Electronically Signed   By: Zetta Bills M.D.   On: 03/13/2019 19:03   DG Chest Port 1 View  Result Date: 03/13/2019 CLINICAL DATA:  Renal failure.  Hypertension and smoking. EXAM: PORTABLE CHEST 1 VIEW COMPARISON:  None. FINDINGS: The heart size and mediastinal contours are within normal limits. Both lungs are clear. The visualized skeletal structures are unremarkable. IMPRESSION: No active disease. Electronically Signed   By: Constance Holster M.D.   On: 03/13/2019 20:36   VAS [place] ABI WITH/WO TBI  Result Date: 03/15/2019 LOWER EXTREMITY DOPPLER STUDY  Performing Technologist: Lita Mains RDMS, RVT  Examination Guidelines: A complete evaluation includes at minimum, Doppler waveform signals and systolic blood pressure reading at the level of bilateral brachial, anterior tibial, and posterior tibial arteries, when  vessel segments are accessible. Bilateral testing is considered an integral part of a complete examination. Photoelectric Plethysmograph (PPG) waveforms and toe systolic pressure readings are included as required and additional duplex testing as needed. Limited examinations for reoccurring indications may be performed as noted.  ABI Findings: +--------+------------------+-----+---------+--------+ Right   Rt Pressure (mmHg)IndexWaveform Comment  +--------+------------------+-----+---------+--------+ [id number]                    triphasic         +--------+------------------+-----+---------+--------+ PTA     219               1.21 triphasic         +--------+------------------+-----+---------+--------+ DP      222               1.23 triphasic         +--------+------------------+-----+---------+--------+ +--------+------------------+-----+---------+-------+ Left    Lt Pressure (mmHg)IndexWaveform Comment +--------+------------------+-----+---------+-------+ [id number]                    triphasic        +--------+------------------+-----+---------+-------+ PTA     220               1.22 biphasic         +--------+------------------+-----+---------+-------+ DP      217               1.20 triphasic        +--------+------------------+-----+---------+-------+ +-------+-----------+-----------+------------+------------+  ABI/TBIToday's ABIToday's TBIPrevious ABIPrevious TBI +-------+-----------+-----------+------------+------------+ Right  1.23                                           +-------+-----------+-----------+------------+------------+ Left   1.22                                           +-------+-----------+-----------+------------+------------+  Summary: Right: Resting right ankle-brachial index is within normal range. No evidence of significant right lower extremity arterial disease. Left: Resting left ankle-brachial index is within normal  range. No evidence of significant left lower extremity arterial disease.  *See table(s) above for measurements and observations.  Electronically signed by Harold Barban MD on 03/15/2019 at 5:23:12 PM.   Final    US BIOPSY (KIDNEY)  Result Date: 03/18/2019 INDICATION: 63 year old male with a history renal dysfunction referred for medical renal biopsy EXAM: IMAGE GUIDED MEDICAL RENAL BIOPSY MEDICATIONS: None. ANESTHESIA/SEDATION: Moderate (conscious) sedation was employed during this procedure. A total of Versed 1.0 mg and Fentanyl 25 mcg was administered intravenously. Moderate Sedation Time: 13 minutes. The patient's level of consciousness and vital signs were monitored continuously by radiology nursing throughout the procedure under my direct supervision. FLUOROSCOPY TIME:  None COMPLICATIONS: None PROCEDURE: Informed written consent was obtained from the patient after a thorough discussion of the procedural risks, benefits and alternatives. All questions were addressed. Maximal Sterile Barrier Technique was utilized including caps, mask, sterile gowns, sterile gloves, sterile drape, hand hygiene and skin antiseptic. A timeout was performed prior to the initiation of the procedure. Patient was positioned prone position on the gantry table. Images were stored sent to PACs. Once the patient is prepped and draped in the usual sterile fashion, the skin and subcutaneous tissues overlying the left kidney were generously infiltrated 1% lidocaine for local anesthesia. Using ultrasound guidance, a 15 gauge guide needle was advanced into the lower cortex of the left kidney. Once we confirmed location of the needle tip, 2 separate 16 gauge core biopsy were achieved. Two Gel-Foam pledgets were infused with a small amount of saline. The needle was removed. Final images were stored. The patient tolerated the procedure well and remained hemodynamically stable throughout. No complications were encountered and no significant  blood loss encountered. IMPRESSION: Status post ultrasound-guided medical renal biopsy. Signed, Dulcy Fanny. Dellia Nims, RPVI [facility] Electronically Signed   By: Corrie Mckusick D.O.   On: 03/18/2019 09:57     Time Spent in minutes  30     Desiree Hane M.D on 03/22/2019 at 4:29 PM  To page go to [email or web address] - password [facility]

## 2019-03-22 NOTE — Plan of Care (Signed)
  Problem: Health Behavior/Discharge Planning: Goal: Ability to manage health-related needs will improve Outcome: Progressing   

## 2019-03-22 NOTE — Progress Notes (Signed)
Louis Butler  Assessment/ Plan: Louis Butler is a 63 y.o. yo male with history of HTN, DM, admitted with bilateral lower extremity rash, AKI with a creatinine level of 5.04.  #Acute kidney injury versus progressive CKD: Nonoliguric.  UA with RBC and serology test consistent with negative ANA, ANCA, dsDNA Ab, MPO, C4, kappa lambda ratio.  C3 level is mildly reduced.  Kidney US with mild bilateral hydronephrosis right renal cyst.  Probably need a urology evaluation.   The preliminary result of kidney biopsy showed acute tubular interstitial nephritis in addition to early/mild diabetic kidney disease.  Plan to treat with a dose of IV Solu-Medrol today and oral prednisone starting from tomorrow.  Discussed with the patient Creatinine level continued to worsen to 5.6 today.  Volume status is acceptable.  No sign of uremia.  Continue to follow lab results.  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  Recommend fluid restriction to the patient.  Sodium level improved to 086 today  #Metabolic acidosis: Improving with sodium bicarbonate.  #Hypertension: On amlodipine.  BP acceptable.  Subjective: Seen and examined at bedside.  Denied nausea, vomiting, chest pain, shortness of breath.  Feels good.  No new event. Objective Vital signs in last 24 hours: Vitals:   03/21/19 1630 03/21/19 2138 03/22/19 0548 03/22/19 0900  BP: 130/69 (!) 153/71 134/69 121/66  Pulse: 79 80 81 83  Resp: 18 12 20 18   Temp: 98 F (36.7 C) 99 F (37.2 C) 98.6 F (37 C) 98.7 F (37.1 C)  TempSrc: Oral Oral Oral Oral  SpO2: 100% 99% 98% 97%  Weight:      Height:       Weight change:   Intake/Output Summary (Last 24 hours) at 03/22/2019 1119 Last data filed at 03/22/2019 0700 Gross per 24 hour  Intake 1300 ml  Output 300 ml  Net 1000 ml       Labs: Basic Metabolic Panel: Recent Labs   Lab 03/17/19 0438 03/20/19 0854 03/21/19 0550 03/22/19 0540  NA 131* 127* 126* 131*  K 4.3 4.4 4.4 4.6  CL 105 97* 95* 98  CO2 20* 22 22 24   GLUCOSE 103* 120* 103* 112*  BUN 45* 56* 57* 63*  CREATININE 4.55* 4.88* 5.37* 5.67*  CALCIUM 7.7* 8.1* 8.1* 8.3*  PHOS 3.2  --   --  3.9   Liver Function Tests: Recent Labs  Lab 03/17/19 0438 03/22/19 0540  ALBUMIN 2.0* 2.2*   No results for input(s): LIPASE, AMYLASE in the last 168 hours. No results for input(s): AMMONIA in the last 168 hours. CBC: Recent Labs  Lab 03/18/19 0332 03/19/19 0538 03/20/19 0447 03/21/19 0550 03/22/19 0540  WBC 7.6 7.3 7.2 6.2 6.0  HGB 7.9* 8.2* 7.7* 7.4* 7.6*  HCT 23.3* 24.6* 22.8* 22.2* 22.8*  MCV 98.7 100.0 99.1 99.6 99.6  PLT 159 191 211 233 278   Cardiac Enzymes: No results for input(s): CKTOTAL, CKMB, CKMBINDEX, TROPONINI in the last 168 hours. CBG: Recent Labs  Lab 03/21/19 1126 03/21/19 1631 03/21/19 2136 03/22/19 0659 03/22/19 1107  GLUCAP 123* 144* 127* 98 121*    Iron Studies: No results for input(s): IRON, TIBC, TRANSFERRIN, FERRITIN in the last 72 hours. Studies/Results: No results found.  Medications: Infusions:   Scheduled Medications: . amLODipine  2.5 mg Oral Daily  . Chlorhexidine Gluconate Cloth  6 each Topical Daily  . darbepoetin (ARANESP) injection -  NON-DIALYSIS  100 mcg Subcutaneous Q Thu-1800  . hydrocortisone   Topical BID  . hydrocortisone   Topical BID  . insulin aspart  0-5 Units Subcutaneous QHS  . insulin aspart  0-9 Units Subcutaneous TID WC  . methylPREDNISolone (SOLU-MEDROL) injection  125 mg Intravenous Once  . polyethylene glycol  17 g Oral BID  . [START ON 03/23/2019] predniSONE  50 mg Oral Q breakfast  . senna-docusate  2 tablet Oral BID  . sodium bicarbonate  650 mg Oral BID  . tamsulosin  0.4 mg Oral QPC supper  . thiamine  100 mg Oral Daily   Or  . thiamine  100 mg Intravenous Daily    have reviewed scheduled and prn  medications.  Physical Exam: General: Not in distress, comfortable Heart:RRR, s1s2 nl Lungs: Clear b/l, no crackle Abdomen:soft, Non-tender, non-distended Extremities:No edema, lower extremity rash around ankle area. Neurology: Alert, awake and following commands  Louis Butler Louis Butler 03/22/2019,11:19 AM  LOS: 9 days  Pager: [phone]

## 2019-03-22 NOTE — TOC Initial Note (Signed)
Transition of Care [facility]) - Initial/Assessment Note    Patient Details  Name: Louis Butler MRN: [id number] Date of Birth: 1955/12/16  [facility]) CM/SW Contact:    Bartholomew Crews, RN Phone Number: [phone] 03/22/2019, 2:11 PM  Clinical Narrative:                 Received call from Margreta Journey [phone]) at [facility] looking for update in plan of care. Noted in nephrology note the plan for IV steroids today with transition to PO tomorrow morning, and monitoring renal function and electrolytes. Anticipate that patient will return home. TOC team following for transition needs.   Expected Discharge Plan: Home/Self Care Barriers to Discharge: Continued Medical Work up   Patient Goals and CMS Choice   CMS Medicare.gov Compare Post Acute Care list provided to:: Patient Choice offered to / list presented to : NA  Expected Discharge Plan and Services Expected Discharge Plan: Home/Self Care     Post Acute Care Choice: NA Living arrangements for the past 2 months: Single Family Home                                      Prior Living Arrangements/Services Living arrangements for the past 2 months: Single Family Home Lives with:: Self, Spouse, Relatives                   Activities of Daily Living Home Assistive Devices/Equipment: Cane (specify quad or straight) ADL Screening (condition at time of admission) Patient's cognitive ability adequate to safely complete daily activities?: Yes Is the patient deaf or have difficulty hearing?: No Does the patient have difficulty seeing, even when wearing glasses/contacts?: No Does the patient have difficulty concentrating, remembering, or making decisions?: No Patient able to express need for assistance with ADLs?: Yes Does the patient have difficulty dressing or bathing?: No Independently performs ADLs?: Yes (appropriate for developmental age) Does the patient have difficulty walking or climbing stairs?:  Yes Weakness of Legs: Both Weakness of Arms/Hands: None  Permission Sought/Granted                  Emotional Assessment              Admission diagnosis:  Renal failure [N19] Acute cystitis with hematuria [N30.01] Acute renal failure, unspecified acute renal failure type ([place]) [N17.9] Anemia, unspecified type [D64.9] Hypertension, unspecified type [I10] Patient Active Problem List   Diagnosis Date Noted  . Constipation 03/19/2019  . Purpura ([place]) 03/16/2019  . Metabolic acidosis, normal anion gap (NAG) 03/16/2019  . AKI (acute kidney injury) ([place]) 03/14/2019  . Non compliance w medication regimen 03/14/2019  . Tobacco use 03/14/2019  . Rash of both feet 03/14/2019  . Hyponatremia 03/14/2019  . Epistaxis 03/14/2019  . Acute renal failure (ARF) ([place]) 03/13/2019  . Wound, open, foot 08/22/2015  . Cellulitis 08/22/2015  . Diabetes mellitus ([place]) 08/22/2015  . Hypertension 08/22/2015   PCP:  Patient, [name] Pharmacy:   [place], [place] - [place] [address] [place] [address] Phone: [phone] Fax: [phone]     Social Determinants of Health (SDOH) Interventions    Readmission Risk Interventions No flowsheet data found.

## 2019-03-23 ENCOUNTER — Inpatient Hospital Stay (HOSPITAL_COMMUNITY): Payer: BC Managed Care – PPO

## 2019-03-23 LAB — CBC
HCT: 22.1 % — ABNORMAL LOW (ref 39.0–52.0)
Hemoglobin: 7.5 g/dL — ABNORMAL LOW (ref 13.0–17.0)
MCH: 33 pg (ref 26.0–34.0)
MCHC: 33.9 g/dL (ref 30.0–36.0)
MCV: 97.4 fL (ref 80.0–100.0)
Platelets: 291 10*3/uL (ref 150–400)
RBC: 2.27 MIL/uL — ABNORMAL LOW (ref 4.22–5.81)
RDW: 14.1 % (ref 11.5–15.5)
WBC: 7.4 10*3/uL (ref 4.0–10.5)
nRBC: 0 % (ref 0.0–0.2)

## 2019-03-23 LAB — RENAL FUNCTION PANEL
Albumin: 2.4 g/dL — ABNORMAL LOW (ref 3.5–5.0)
Anion gap: 13 (ref 5–15)
BUN: 73 mg/dL — ABNORMAL HIGH (ref 8–23)
CO2: 19 mmol/L — ABNORMAL LOW (ref 22–32)
Calcium: 8.3 mg/dL — ABNORMAL LOW (ref 8.9–10.3)
Chloride: 94 mmol/L — ABNORMAL LOW (ref 98–111)
Creatinine, Ser: 5.75 mg/dL — ABNORMAL HIGH (ref 0.61–1.24)
GFR calc Af Amer: 11 mL/min — ABNORMAL LOW (ref >60)
GFR calc non Af Amer: 10 mL/min — ABNORMAL LOW (ref >60)
Glucose, Bld: 225 mg/dL — ABNORMAL HIGH (ref 70–99)
Phosphorus: 2.6 mg/dL (ref 2.5–4.6)
Potassium: 4.9 mmol/L (ref 3.5–5.1)
Sodium: 126 mmol/L — ABNORMAL LOW (ref 135–145)

## 2019-03-23 LAB — GLUCOSE, CAPILLARY
Glucose-Capillary: 185 mg/dL — ABNORMAL HIGH (ref 70–99)
Glucose-Capillary: 206 mg/dL — ABNORMAL HIGH (ref 70–99)
Glucose-Capillary: 242 mg/dL — ABNORMAL HIGH (ref 70–99)
Glucose-Capillary: 286 mg/dL — ABNORMAL HIGH (ref 70–99)

## 2019-03-23 MED ORDER — SODIUM BICARBONATE 650 MG PO TABS
1300.0000 mg | ORAL_TABLET | Freq: Two times a day (BID) | ORAL | Status: DC
  Administered 2019-03-23 – 2019-03-26 (×6): 1300 mg via ORAL
  Filled 2019-03-23 (×6): qty 2

## 2019-03-23 MED ORDER — DARBEPOETIN ALFA 60 MCG/0.3ML IJ SOSY: 60.0000 ug | PREFILLED_SYRINGE | Freq: Once | INTRAMUSCULAR | Status: DC

## 2019-03-23 MED ORDER — PREDNISONE 50 MG PO TABS
60.0000 mg | ORAL_TABLET | Freq: Every day | ORAL | Status: DC
  Administered 2019-03-24 – 2019-03-25 (×2): 60 mg via ORAL
  Filled 2019-03-23 (×2): qty 1

## 2019-03-23 NOTE — Progress Notes (Signed)
PROGRESS NOTE   Louis Butler  IOE:[id number]    DOB: Jun 27, 1955    DOA: 03/13/2019  PCP: Patient, [name]   I have briefly reviewed patients previous medical records in [facility].  Chief Complaint:   Chief Complaint  Patient presents with  . Foot Pain    Brief Narrative:  63 year old married male, lives with his spouse and granddaughter, independent, PMH of DM2, HTN,?  Arrhythmia, UTI who has not seen PCP for at least 2 years and not on any prescription medications at home, presented to [facility] [place] [facility] on 03/13/2019 due to bilateral lower extremity erythematous rash of 2 to 4 weeks duration with swelling and pain.  Admitted for acute kidney injury, suspected due to vasculitic process, acute anemia without bleeding, urinary retention and concern for UTI.  S/p IR guided biopsy on 12/11.  Nephrology consulting, steroids initiated 12/15.   Assessment & Plan:  Principal Problem:   Acute renal failure (ARF) (HCC) Active Problems:   Diabetes mellitus (HCC)   Hypertension   Non compliance w medication regimen   Tobacco use   Rash of both feet   Hyponatremia   Epistaxis   Purpura (HCC)   Metabolic acidosis, normal anion gap (NAG)   Constipation   Acute kidney injury complicating chronic kidney disease  Nephrology consulting and assisting with management.  Creatinine peaked to 5.6 on 12/15.  Baseline creatinine 1.21 11/2016.  Nonoliguric and has good urine output.  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  S/p IV Solu-Medrol x1 dose on 12/15 and now transitioned to oral prednisone 60 mg daily.  Creatinine seems to be plateauing compared to yesterday, monitor trend.  Not clinically uremic and no urgent indicators for dialysis.  Urinary retention and bilateral hydronephrosis   Started on Flomax here.  Renal ultrasound 12/6: Bilateral hydronephrosis, right greater than left.  No shadowing stone.  Increased renal parenchyma echogenicity in keeping with chronic kidney disease.  CT abdomen pelvis without contrast 12/7: Bilateral mild hydroureteral nephrosis to the level of urinary bladder.  We will repeat renal ultrasound to see if hydronephrosis has resolved.  May consider urology consultation if not resolved.  Patient had some urinary irritation post Foley removal which may have resolved.  No clinical UTI.  Monitor.  Right renal mass  Noted on CT abdomen and pelvis without contrast 12/7.  Radiology reports most consistent with a cyst based on renal ultrasound.  They recommend considering surveillance with renal ultrasound at 6 months and attention to right renal lesion.  Nonblanching purpuric rash  Noted on bilateral ankle, feet and in the neck area.  Most concerning for vasculitic process in the setting of acute kidney injury.  Serology results as noted above.  S/p IR guided biopsy on 12/11.  Currently on steroids p.o. and topical and rash seems to be improving.  Non-anion gap metabolic acidosis  Secondary to acute kidney injury.  Nephrology has increased sodium bicarbonate to 1300 mg twice daily.  Normocytic anemia  Possibly due to chronic disease and chronic kidney disease.  Iron panel with normal iron and decreased TIBC.  ESA per nephrology.  Hemoglobin stable in the mid to low 7 g per DL range.  Hyponatremia  Possibly due to acute kidney injury.  Sodium fluctuating in the mid 120s-low 130 range.  Asymptomatic of same.  Continue sodium bicarbonate.  Management  per nephrology.  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  Mild hyperglycemia now complicated by steroids.  Continue SSI and adjust insulins as needed.  Essential hypertension  Mildly uncontrolled at times.  Continue amlodipine 2.5 mg daily.   Reported epistaxis  Resolved  Tobacco/alcohol dependence  Cessation counseled.  Reported binge drinking on weekends PTA.  No withdrawal.    DVT prophylaxis: SCDs Code Status: Full Family Communication: None at bedside Disposition: To home pending clinical improvement and nephrology clearance, possibly another 3 to 4 days as discussed with nephrology today.   Consultants:   Nephrology IR  Procedures:   IR guided biopsy 12/11  Antimicrobials:   None   Subjective:  States that his rash on his feet and neck are slowly improving.  Indicates that he has been picking at the rash although it is not itchy.  Advised him to not scratch or pick at his rash.  Objective:   Vitals:   03/22/19 2045 03/23/19 0416 03/23/19 0900 03/23/19 1500  BP: (!) 155/83 (!) 176/90 135/74 133/69  Pulse: 83 88 82 67  Resp: 20 16 18 18   Temp: 98.2 F (36.8 C) 98 F (36.7 C) 97.7 F (36.5 C) 97.9 F (36.6 C)  TempSrc: Oral Oral  Oral  SpO2: 98% 98% 99% 98%  Weight:  98.1 kg    Height:        General exam: Pleasant middle-age male, moderately built and nourished lying comfortably supine in bed without distress.  Oral mucosa moist. Respiratory system: Clear to auscultation. Respiratory effort normal. Cardiovascular system: S1 & S2 heard, RRR. No JVD, murmurs, rubs, gallops or clicks. No pedal edema.  Telemetry personally reviewed: Sinus rhythm with occasional PVCs. Gastrointestinal system: Abdomen is nondistended, soft and nontender. No organomegaly or masses felt. Normal bowel sounds heard. Central nervous system: Alert and oriented. No focal neurological deficits. Extremities: Symmetric 5 x 5 power. Skin: Mildly erythematous maculopapular rash noted to bilateral ankle and feet, right side of neck but that on cheek seems to have resolved. Psychiatry: Judgement and insight appear normal. Mood & affect appropriate.     Data Reviewed:   I have personally reviewed following labs and imaging  studies   CBC: Recent Labs  Lab 03/21/19 0550 03/22/19 0540 03/23/19 0538  WBC 6.2 6.0 7.4  HGB 7.4* 7.6* 7.5*  HCT 22.2* 22.8* 22.1*  MCV 99.6 99.6 97.4  PLT 233 278 623    Basic Metabolic Panel: Recent Labs  Lab 03/17/19 0438 03/21/19 0550 03/22/19 0540 03/23/19 0538  NA 131* 126* 131* 126*  K 4.3 4.4 4.6 4.9  CL 105 95* 98 94*  CO2 20* 22 24 19*  GLUCOSE 103* 103* 112* 225*  BUN 45* 57* 63* 73*  CREATININE 4.55* 5.37* 5.67* 5.75*  CALCIUM 7.7* 8.1* 8.3* 8.3*  PHOS 3.2  --  3.9 2.6    Liver Function Tests: Recent Labs  Lab 03/17/19 0438 03/22/19 0540 03/23/19 0538  ALBUMIN 2.0* 2.2* 2.4*    CBG: Recent Labs  Lab 03/23/19 0718 03/23/19 1121 03/23/19 1614  GLUCAP 185* 206* 242*    Microbiology Studies:   Recent Results (from the past 240 hour(s))  SARS CORONAVIRUS 2 (TAT 6-24 HRS) Nasopharyngeal Nasopharyngeal Swab     Status: None   Collection Time: 03/13/19  7:11 PM   Specimen: Nasopharyngeal Swab  Result Value Ref Range Status   SARS Coronavirus 2 NEGATIVE NEGATIVE Final    Comment: (NOTE) SARS-CoV-2 target nucleic acids are NOT  DETECTED. The SARS-CoV-2 RNA is generally detectable in upper and lower respiratory specimens during the acute phase of infection. Negative results do not preclude SARS-CoV-2 infection, do not rule out co-infections with other pathogens, and should not be used as the sole basis for treatment or other patient management decisions. Negative results must be combined with clinical observations, patient history, and epidemiological information. The expected result is Negative. Fact Sheet for Patients: SugarRoll.be Fact Sheet for Healthcare Providers: https://www.woods-mathews.com/ This test is not yet approved or cleared by the [place] FDA and  has been authorized for detection and/or diagnosis of SARS-CoV-2 by FDA under an Emergency Use Authorization (EUA). This EUA will  remain  in effect (meaning this test can be used) for the duration of the COVID-19 declaration under Section 56 4(b)(1) of the Act, 21 U.S.C. section 360bbb-3(b)(1), unless the authorization is terminated or revoked sooner. Performed at [facility], [place] [address], [place], [place] [address]      Radiology Studies:  No results found.   Scheduled Meds:   . amLODipine  2.5 mg Oral Daily  . Chlorhexidine Gluconate Cloth  6 each Topical Daily  . darbepoetin (ARANESP) injection - NON-DIALYSIS  100 mcg Subcutaneous Q Thu-1800  . hydrocortisone   Topical BID  . hydrocortisone   Topical BID  . insulin aspart  0-5 Units Subcutaneous QHS  . insulin aspart  0-9 Units Subcutaneous TID WC  . [START ON 03/24/2019] predniSONE  60 mg Oral Q breakfast  . sodium bicarbonate  1,300 mg Oral BID  . tamsulosin  0.4 mg Oral QPC supper  . thiamine  100 mg Oral Daily   Or  . thiamine  100 mg Intravenous Daily    Continuous Infusions:     LOS: 10 days     Vernell Leep, MD, [place], [facility]. [facility]    To contact the attending provider between 7A-7P or the covering provider during after hours 7P-7A, please log into the web site [email or web address] and access using universal [place] password for that web site. If you do not have the password, please call the hospital operator.  03/23/2019, 6:01 PM

## 2019-03-23 NOTE — Progress Notes (Addendum)
[facility] NEPHROLOGY PROGRESS NOTE  Assessment/ Plan: Pt is a 63 y.o. yo male with history of HTN, DM, admitted with bilateral lower extremity rash, AKI with a creatinine level of 5.04.  #Acute kidney injury versus progressive CKD: Nonoliguric.  UA with RBC and serology test consistent with negative ANA, ANCA, dsDNA Ab, MPO, C4, kappa lambda ratio.  C3 level is mildly reduced.    The preliminary result of kidney biopsy showed acute tubular interstitial nephritis in addition to early/mild diabetic kidney disease.  Received IV Solu-Medrol on 12/15.  Plan to continue oral prednisone. Patient is nonoliguric and rate of rise in creatinine is slowing down.  Hopefully the creatinine will plateau and has renal recovery.  His volume status is acceptable and has no uremia.  Discussed with the primary team and with the patient.  #Mild bilateral hydronephrosis seen on imaging studies: may consider urologist's input.   #Hyponatremia, euvolemic, appears due to reduced free water excretion in the setting of AKI and from chronic alcohol use, beer potomania.  Recommend to continue fluid restriction and increase intake of solute.  Monitor lab.  #Metabolic acidosis: Increase the dose of sodium bicarbonate.  #Anemia of CKD: Iron saturation 38%.  Order Aranesp.  #Hypertension: On amlodipine.  BP acceptable.  Subjective: Seen and examined at bedside.  No new event.  Denies nausea, vomiting, chest pain, shortness of breath. Objective Vital signs in last 24 hours: Vitals:   03/22/19 1500 03/22/19 2045 03/23/19 0416 03/23/19 0900  BP: (!) 159/89 (!) 155/83 (!) 176/90 135/74  Pulse: 91 83 88 82  Resp: 18 20 16 18   Temp: 98.2 F (36.8 C) 98.2 F (36.8 C) 98 F (36.7 C) 97.7 F (36.5 C)  TempSrc: Oral Oral Oral   SpO2: 99% 98% 98% 99%  Weight:   98.1 kg   Height:       Weight change:   Intake/Output Summary (Last 24 hours) at 03/23/2019 1048 Last data filed at 03/23/2019 0900 Gross  per 24 hour  Intake 1440 ml  Output 1600 ml  Net -160 ml       Labs: Basic Metabolic Panel: Recent Labs  Lab 03/17/19 0438 03/21/19 0550 03/22/19 0540 03/23/19 0538  NA 131* 126* 131* 126*  K 4.3 4.4 4.6 4.9  CL 105 95* 98 94*  CO2 20* 22 24 19*  GLUCOSE 103* 103* 112* 225*  BUN 45* 57* 63* 73*  CREATININE 4.55* 5.37* 5.67* 5.75*  CALCIUM 7.7* 8.1* 8.3* 8.3*  PHOS 3.2  --  3.9 2.6   Liver Function Tests: Recent Labs  Lab 03/17/19 0438 03/22/19 0540 03/23/19 0538  ALBUMIN 2.0* 2.2* 2.4*   No results for input(s): LIPASE, AMYLASE in the last 168 hours. No results for input(s): AMMONIA in the last 168 hours. CBC: Recent Labs  Lab 03/19/19 0538 03/20/19 0447 03/21/19 0550 03/22/19 0540 03/23/19 0538  WBC 7.3 7.2 6.2 6.0 7.4  HGB 8.2* 7.7* 7.4* 7.6* 7.5*  HCT 24.6* 22.8* 22.2* 22.8* 22.1*  MCV 100.0 99.1 99.6 99.6 97.4  PLT 191 211 233 278 291   Cardiac Enzymes: No results for input(s): CKTOTAL, CKMB, CKMBINDEX, TROPONINI in the last 168 hours. CBG: Recent Labs  Lab 03/22/19 0659 03/22/19 1107 03/22/19 1615 03/22/19 2043 03/23/19 0718  GLUCAP 98 121* 220* 365* 185*    Iron Studies: No results for input(s): IRON, TIBC, TRANSFERRIN, FERRITIN in the last 72 hours. Studies/Results: No results found.  Medications: Infusions:   Scheduled Medications: . amLODipine  2.5 mg  Oral Daily  . Chlorhexidine Gluconate Cloth  6 each Topical Daily  . darbepoetin (ARANESP) injection - NON-DIALYSIS  100 mcg Subcutaneous Q Thu-1800  . hydrocortisone   Topical BID  . hydrocortisone   Topical BID  . insulin aspart  0-5 Units Subcutaneous QHS  . insulin aspart  0-9 Units Subcutaneous TID WC  . [START ON 03/24/2019] predniSONE  60 mg Oral Q breakfast  . sodium bicarbonate  650 mg Oral BID  . tamsulosin  0.4 mg Oral QPC supper  . thiamine  100 mg Oral Daily   Or  . thiamine  100 mg Intravenous Daily    have reviewed scheduled and prn medications.  Physical  Exam: General: Not in distress, comfortable Heart:RRR, s1s2 nl Lungs: Clear b/l, no crackle Abdomen:soft, Non-tender, non-distended Extremities:No LE edema, lower extremity rash around ankle area. Neurology: Alert, awake and following commands  Louis Butler Louis Butler 03/23/2019,10:48 AM  LOS: 10 days  Pager: [phone]

## 2019-03-24 DIAGNOSIS — R338 Other retention of urine: Secondary | ICD-10-CM

## 2019-03-24 LAB — RENAL FUNCTION PANEL
Albumin: 2.8 g/dL — ABNORMAL LOW (ref 3.5–5.0)
Anion gap: 11 (ref 5–15)
BUN: 79 mg/dL — ABNORMAL HIGH (ref 8–23)
CO2: 23 mmol/L (ref 22–32)
Calcium: 8.8 mg/dL — ABNORMAL LOW (ref 8.9–10.3)
Chloride: 96 mmol/L — ABNORMAL LOW (ref 98–111)
Creatinine, Ser: 5.7 mg/dL — ABNORMAL HIGH (ref 0.61–1.24)
GFR calc Af Amer: 11 mL/min — ABNORMAL LOW (ref >60)
GFR calc non Af Amer: 10 mL/min — ABNORMAL LOW (ref >60)
Glucose, Bld: 135 mg/dL — ABNORMAL HIGH (ref 70–99)
Phosphorus: 3.5 mg/dL (ref 2.5–4.6)
Potassium: 4.2 mmol/L (ref 3.5–5.1)
Sodium: 130 mmol/L — ABNORMAL LOW (ref 135–145)

## 2019-03-24 LAB — CBC
HCT: 23.8 % — ABNORMAL LOW (ref 39.0–52.0)
Hemoglobin: 8.2 g/dL — ABNORMAL LOW (ref 13.0–17.0)
MCH: 33.1 pg (ref 26.0–34.0)
MCHC: 34.5 g/dL (ref 30.0–36.0)
MCV: 96 fL (ref 80.0–100.0)
Platelets: 368 10*3/uL (ref 150–400)
RBC: 2.48 MIL/uL — ABNORMAL LOW (ref 4.22–5.81)
RDW: 14.1 % (ref 11.5–15.5)
WBC: 10.7 10*3/uL — ABNORMAL HIGH (ref 4.0–10.5)
nRBC: 0 % (ref 0.0–0.2)

## 2019-03-24 LAB — GLUCOSE, CAPILLARY
Glucose-Capillary: 121 mg/dL — ABNORMAL HIGH (ref 70–99)
Glucose-Capillary: 182 mg/dL — ABNORMAL HIGH (ref 70–99)
Glucose-Capillary: 339 mg/dL — ABNORMAL HIGH (ref 70–99)
Glucose-Capillary: 269 mg/dL — ABNORMAL HIGH (ref 70–99)

## 2019-03-24 MED ORDER — TAMSULOSIN HCL 0.4 MG PO CAPS
0.4000 mg | ORAL_CAPSULE | Freq: Two times a day (BID) | ORAL | Status: DC
  Administered 2019-03-24 – 2019-03-28 (×8): 0.4 mg via ORAL
  Filled 2019-03-24 (×7): qty 1

## 2019-03-24 NOTE — Consult Note (Signed)
Urology Consult  Referring physician: Dr. Algis Liming Reason for referral: urinary retention  Chief Complaint: urinary retention  History of Present Illness: Mr Louis Butler is a 63yo with a hx of DMII who was admitted with UTI, ARF and retention. Foley was placed 10 days ao and 1000cc drained. Creatinine on admission was 5.4 up from 1.2. He was started on flomax and underwent voiding trial a week later which he failed and once again developed ARF. Foley catheter was replaced. Prior to hospitalization he was having severe LUTS. He had urinary frequency every 30-60 minutes, nocturia 4-6x, and a very weak stream. No prior BPH therapy. No other associated symptoms. No exacerbating/alleviaitng events.   Past Medical History:  Diagnosis Date  . ARF (acute renal failure) ([place]) 03/14/2019  . Cellulitis 03/14/2019   feet  . Diabetes mellitus without complication ([place])   . Hypertension   . Irregular heart beat   . UTI (urinary tract infection)    Past Surgical History:  Procedure Laterality Date  . WISDOM TOOTH EXTRACTION      Medications: I have reviewed the patient's current medications. Allergies:  Allergies  Allergen Reactions  . Hydrocodone Hives and Itching  . Percocet [Oxycodone-Acetaminophen] Hives and Itching    Family History  Problem Relation Age of Onset  . Diabetes type II Mother   . Lung cancer Mother   . Hypertension Mother   . Diabetes type II Father   . Diabetes type II Sister   . Diabetes type II Brother   . Hypertension Brother   . CAD Neg Hx    Social History:  reports that he has been smoking cigarettes. He has never used smokeless tobacco. He reports current alcohol use. He reports that he does not use drugs.  Review of Systems  Genitourinary: Positive for difficulty urinating, frequency and urgency.  All other systems reviewed and are negative.   Physical Exam:  Vital signs in last 24 hours: Temp:  [98 F (36.7 C)-98.2 F (36.8 C)] 98.2 F (36.8 C) (12/17  2014) Pulse Rate:  [78-80] 78 (12/17 2014) Resp:  [16-18] 16 (12/17 2014) BP: (130-145)/(70-84) 145/84 (12/17 2014) SpO2:  [98 %-100 %] 98 % (12/17 2014) Physical Exam  Constitutional: He is oriented to person, place, and time. He appears well-developed and well-nourished.  HENT:  Head: Normocephalic and atraumatic.  Eyes: Pupils are equal, round, and reactive to light. EOM are normal.  Neck: No thyromegaly present.  Cardiovascular: Normal rate and regular rhythm.  Respiratory: Effort normal. No respiratory distress.  GI: Soft. He exhibits no distension. Hernia confirmed negative in the right inguinal area and confirmed negative in the left inguinal area.  Genitourinary:    Testes, penis and rectum normal.  Prostate is enlarged. Prostate is not tender.  Musculoskeletal:        General: Normal range of motion.     Cervical back: Normal range of motion.  Lymphadenopathy:       Right: No inguinal adenopathy present.       Left: No inguinal adenopathy present.  Neurological: He is alert and oriented to person, place, and time. No cranial nerve deficit.  Skin: Skin is warm and dry.  Psychiatric: He has a normal mood and affect. His behavior is normal. Judgment and thought content normal.    Laboratory Data:  Results for orders placed or performed during the hospital encounter of 03/13/19 (from the past 72 hour(s))  Renal function panel     Status: Abnormal   Collection Time: 03/22/19  5:40 AM  Result Value Ref Range   Sodium 131 (L) 135 - 145 mmol/L   Potassium 4.6 3.5 - 5.1 mmol/L   Chloride 98 98 - 111 mmol/L   CO2 24 22 - 32 mmol/L   Glucose, Bld 112 (H) 70 - 99 mg/dL   BUN 63 (H) 8 - 23 mg/dL   Creatinine, Ser 5.67 (H) 0.61 - 1.24 mg/dL   Calcium 8.3 (L) 8.9 - 10.3 mg/dL   Phosphorus 3.9 2.5 - 4.6 mg/dL   Albumin 2.2 (L) 3.5 - 5.0 g/dL   GFR calc non Af Amer 10 (L) >60 mL/min   GFR calc Af Amer 11 (L) >60 mL/min   Anion gap 9 5 - 15    Comment: Performed at [facility] [address] [place], [place] [address]  CBC     Status: Abnormal   Collection Time: 03/22/19  5:40 AM  Result Value Ref Range   WBC 6.0 4.0 - 10.5 K/uL   RBC 2.29 (L) 4.22 - 5.81 MIL/uL   Hemoglobin 7.6 (L) 13.0 - 17.0 g/dL   HCT 22.8 (L) 39.0 - 52.0 %   MCV 99.6 80.0 - 100.0 fL   MCH 33.2 26.0 - 34.0 pg   MCHC 33.3 30.0 - 36.0 g/dL   RDW 14.5 11.5 - 15.5 %   Platelets 278 150 - 400 K/uL   nRBC 0.0 0.0 - 0.2 %    Comment: Performed at [facility], [place] [address], [place], [place] [address]  Glucose, capillary     Status: None   Collection Time: 03/22/19  6:59 AM  Result Value Ref Range   Glucose-Capillary 98 70 - 99 mg/dL  Glucose, capillary     Status: Abnormal   Collection Time: 03/22/19 11:07 AM  Result Value Ref Range   Glucose-Capillary 121 (H) 70 - 99 mg/dL  Glucose, capillary     Status: Abnormal   Collection Time: 03/22/19  4:15 PM  Result Value Ref Range   Glucose-Capillary 220 (H) 70 - 99 mg/dL  Glucose, capillary     Status: Abnormal   Collection Time: 03/22/19  8:43 PM  Result Value Ref Range   Glucose-Capillary 365 (H) 70 - 99 mg/dL  Renal function panel     Status: Abnormal   Collection Time: 03/23/19  5:38 AM  Result Value Ref Range   Sodium 126 (L) 135 - 145 mmol/L   Potassium 4.9 3.5 - 5.1 mmol/L   Chloride 94 (L) 98 - 111 mmol/L   CO2 19 (L) 22 - 32 mmol/L   Glucose, Bld 225 (H) 70 - 99 mg/dL   BUN 73 (H) 8 - 23 mg/dL   Creatinine, Ser 5.75 (H) 0.61 - 1.24 mg/dL   Calcium 8.3 (L) 8.9 - 10.3 mg/dL   Phosphorus 2.6 2.5 - 4.6 mg/dL   Albumin 2.4 (L) 3.5 - 5.0 g/dL   GFR calc non Af Amer 10 (L) >60 mL/min   GFR calc Af Amer 11 (L) >60 mL/min   Anion gap 13 5 - 15    Comment: Performed at [facility], [place] [address] [place] [address]  CBC     Status: Abnormal   Collection Time: 03/23/19  5:38 AM  Result Value Ref Range   WBC 7.4 4.0 - 10.5 K/uL   RBC 2.27 (L) 4.22 - 5.81 MIL/uL   Hemoglobin 7.5 (L) 13.0 - 17.0 g/dL    HCT 22.1 (L) 39.0 - 52.0 %  MCV 97.4 80.0 - 100.0 fL   MCH 33.0 26.0 - 34.0 pg   MCHC 33.9 30.0 - 36.0 g/dL   RDW 14.1 11.5 - 15.5 %   Platelets 291 150 - 400 K/uL   nRBC 0.0 0.0 - 0.2 %    Comment: Performed at [facility], [place] [address], [place], [place] [address]  Glucose, capillary     Status: Abnormal   Collection Time: 03/23/19  7:18 AM  Result Value Ref Range   Glucose-Capillary 185 (H) 70 - 99 mg/dL  Glucose, capillary     Status: Abnormal   Collection Time: 03/23/19 11:21 AM  Result Value Ref Range   Glucose-Capillary 206 (H) 70 - 99 mg/dL  Glucose, capillary     Status: Abnormal   Collection Time: 03/23/19  4:14 PM  Result Value Ref Range   Glucose-Capillary 242 (H) 70 - 99 mg/dL  Glucose, capillary     Status: Abnormal   Collection Time: 03/23/19  9:13 PM  Result Value Ref Range   Glucose-Capillary 286 (H) 70 - 99 mg/dL  Glucose, capillary     Status: Abnormal   Collection Time: 03/24/19  6:56 AM  Result Value Ref Range   Glucose-Capillary 121 (H) 70 - 99 mg/dL  Renal function panel     Status: Abnormal   Collection Time: 03/24/19  7:47 AM  Result Value Ref Range   Sodium 130 (L) 135 - 145 mmol/L   Potassium 4.2 3.5 - 5.1 mmol/L   Chloride 96 (L) 98 - 111 mmol/L   CO2 23 22 - 32 mmol/L   Glucose, Bld 135 (H) 70 - 99 mg/dL   BUN 79 (H) 8 - 23 mg/dL   Creatinine, Ser 5.70 (H) 0.61 - 1.24 mg/dL   Calcium 8.8 (L) 8.9 - 10.3 mg/dL   Phosphorus 3.5 2.5 - 4.6 mg/dL   Albumin 2.8 (L) 3.5 - 5.0 g/dL   GFR calc non Af Amer 10 (L) >60 mL/min   GFR calc Af Amer 11 (L) >60 mL/min   Anion gap 11 5 - 15    Comment: Performed at [facility], [address] [place], [place] [address]  CBC     Status: Abnormal   Collection Time: 03/24/19  7:47 AM  Result Value Ref Range   WBC 10.7 (H) 4.0 - 10.5 K/uL   RBC 2.48 (L) 4.22 - 5.81 MIL/uL   Hemoglobin 8.2 (L) 13.0 - 17.0 g/dL   HCT 23.8 (L) 39.0 - 52.0 %   MCV 96.0 80.0 - 100.0 fL   MCH 33.1 26.0 - 34.0 pg    MCHC 34.5 30.0 - 36.0 g/dL   RDW 14.1 11.5 - 15.5 %   Platelets 368 150 - 400 K/uL   nRBC 0.0 0.0 - 0.2 %    Comment: Performed at [facility], [place] [address], [place], [place] [address]  Glucose, capillary     Status: Abnormal   Collection Time: 03/24/19 11:13 AM  Result Value Ref Range   Glucose-Capillary 182 (H) 70 - 99 mg/dL  Glucose, capillary     Status: Abnormal   Collection Time: 03/24/19  4:07 PM  Result Value Ref Range   Glucose-Capillary 339 (H) 70 - 99 mg/dL  Glucose, capillary     Status: Abnormal   Collection Time: 03/24/19  8:53 PM  Result Value Ref Range   Glucose-Capillary 269 (H) 70 - 99 mg/dL   No results found for this or any previous  visit (from the past 240 hour(s)). Creatinine: Recent Labs    03/18/19 0332 03/19/19 0538 03/20/19 0854 03/21/19 0550 03/22/19 0540 03/23/19 0538 03/24/19 0747  CREATININE 4.71* 4.54* 4.88* 5.37* 5.67* 5.75* 5.70*   Baseline Creatinine: 1.2  Impression/Assessment:  63yo with BPH with urinary retention  Plan:  I discussed the management of BPH with the patient including medical and surgical interventions. We have elected to proceed with continued medical therapy. We will increase his flomax to 0.4mg  BID and the patient will followup in my office in 1 week for a voiding trial  Nicolette Bang 03/24/2019, 11:02 PM

## 2019-03-24 NOTE — Progress Notes (Signed)
PROGRESS NOTE   Louis Butler  OZY:[id number]    DOB: 1955-04-09    DOA: 03/13/2019  PCP: Patient, [name]   I have briefly reviewed patients previous medical records in [facility]  Chief Complaint:   Chief Complaint  Patient presents with  . Foot Pain    Brief Narrative:  64 year old married male, lives with his spouse and granddaughter, independent, PMH of DM2, HTN,?  Arrhythmia, UTI who has not seen PCP for at least 2 years and not on any prescription medications at home, presented to [facility] via [facility] on 03/13/2019 due to bilateral lower extremity erythematous rash of 2 to 4 weeks duration with swelling and pain.  Admitted for acute kidney injury, suspected due to vasculitic process, acute anemia without bleeding, urinary retention and concern for UTI.  S/p IR guided renal biopsy on 12/11.  Nephrology consulting, steroids initiated 12/15.  Recurrent acute urinary retention with bilateral moderate to severe hydronephrosis, placed indwelling Foley catheter again on 12/17 and consulted urology.   Assessment & Plan:  Principal Problem:   Acute renal failure (ARF) ([facility]) Active Problems:   Diabetes mellitus ([facility])   Hypertension   Non compliance w medication regimen   Tobacco use   Rash of both feet   Hyponatremia   Epistaxis   Purpura ([facility])   Metabolic acidosis, normal anion gap (NAG)   Constipation   Acute kidney injury complicating chronic kidney disease  Nephrology consulting and assisting with management.  Creatinine peaked to 5.6 on 12/15.  Baseline creatinine 1.21 in 11/2016.  Nonoliguric and has good urine output.  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  I am unable to find final pathology results in [facility] yet.  S/p IV Solu-Medrol x1 dose on 12/15 and now transitioned to oral  prednisone 60 mg daily.  Creatinine has plateaued in the 5.6-5.7 range over the last 3 days.  Not clinically uremic and no urgent indicators for dialysis.  Has recurrent acute urinary retention which may be contributing to lack of creatinine improvement, see discussion below.  Urinary retention and bilateral hydronephrosis  Started on Flomax here.  Renal ultrasound 12/6: Bilateral hydronephrosis, right greater than left.  No shadowing stone.  Increased renal parenchyma echogenicity in keeping with chronic kidney disease.  CT abdomen pelvis without contrast 12/7: Bilateral mild hydroureteral nephrosis to the level of urinary bladder.  Patient had some urinary irritation post Foley removal which may have resolved.  No clinical UTI.  Monitor.  Repeat renal ultrasound 12/16: Moderate to severe bilateral hydronephrosis with associated cortical thickening.  Post void at renal ultrasound showed urinary retention of 1085 mL.  Bedside bladder scan showed >855 mL.  Foley catheter placed back on 12/17 yielding 900 mL urine output right away, continue Foley catheter.  Urology consulted, discussed with Dr. Alyson Ingles.  Will likely need Foley catheter for at least 2 weeks until outpatient follow-up with urology and plan to increase Flomax to twice daily.  Right renal mass  Noted on CT abdomen and pelvis without contrast 12/7.  Radiology reports most consistent with a cyst based on renal ultrasound.  They recommend considering surveillance with renal ultrasound at 6 months and attention to right renal lesion.  As per repeat renal ultrasound 12/16: 3 cm cystic focus in the upper pole right kidney corresponding well for hyperdense structure on the comparison CT favors  benign hyperdense/proteinaceous cyst though recommend considering 34-month follow-up ultrasound to assess stability.  Nonblanching purpuric rash  Noted on bilateral ankle, feet and in the neck area.  Most concerning for vasculitic process  in the setting of acute kidney injury.  Serology results as noted above.  S/p IR guided biopsy on 12/11.  Currently on steroids p.o. and topical and rash seems to be improving.  Non-anion gap metabolic acidosis  Secondary to acute kidney injury.  Nephrology has increased sodium bicarbonate to 1300 mg twice daily.  Bicarbonate has normalized/23.  Normocytic anemia  Possibly due to chronic disease and chronic kidney disease.  Iron panel with normal iron and decreased TIBC.  ESA per nephrology.  Hemoglobin stable in the mid to low 7 g per DL range.  Hemoglobin up to 8.2 today.  Hyponatremia  Possibly due to acute kidney injury.  Sodium fluctuating in the mid 120s-low 130 range.  Asymptomatic of same.  Continue sodium bicarbonate.  Management per nephrology.  Sodium up to 130 today.  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  Mild hyperglycemia now complicated by steroids.  Continue SSI and adjust insulins as needed.  Essential hypertension  Mildly uncontrolled at times.  Continue amlodipine 2.5 mg daily.  Reported epistaxis  Resolved  Tobacco/alcohol dependence  Cessation counseled.  Reported binge drinking on weekends PTA.  No withdrawal.    DVT prophylaxis: SCDs Code Status: Full Family Communication: None at bedside Disposition: To home pending clinical improvement and nephrology clearance, possibly another 3 to 4 days as discussed with nephrology today.   Consultants:   Nephrology IR Urology  Procedures:   Ultrasound-guided left renal biopsy by IR on 12/17. Foley catheter placed 12/17.  Antimicrobials:   None   Subjective:  Reports urinary frequency with small volume output each time.  Denies dysuria or irritation.  No abdominal pain.  Objective:   Vitals:   03/23/19 0900 03/23/19 1500 03/23/19 2112 03/24/19 0617  BP: 135/74 133/69 (!) 143/78 130/70  Pulse: 82 67 81 79  Resp: 18 18 18 18   Temp: 97.7 F (36.5  C) 97.9 F (36.6 C) 97.6 F (36.4 C) 98.2 F (36.8 C)  TempSrc:  Oral  Oral  SpO2: 99% 98% 97% 99%  Weight:   98.1 kg   Height:        General exam: Pleasant middle-age male, moderately built and nourished lying comfortably supine in bed without distress.  Oral mucosa moist. Respiratory system: Clear to auscultation.  No increased work of breathing. Cardiovascular system: S1 and S2 heard, RRR.  No JVD, murmurs or pedal edema. Gastrointestinal system: Abdomen is nondistended, soft and nontender. No organomegaly or masses felt. Normal bowel sounds heard.  Bladder not clearly palpable. Central nervous system: Alert and oriented. No focal neurological deficits. Extremities: Symmetric 5 x 5 power. Skin: Mildly erythematous maculopapular rash noted to bilateral ankle and feet, right side of neck but that on cheek seems to have resolved. Psychiatry: Judgement and insight appear normal. Mood & affect appropriate.     Data Reviewed:   I have personally reviewed following labs and imaging studies   CBC: Recent Labs  Lab 03/22/19 0540 03/23/19 0538 03/24/19 0747  WBC 6.0 7.4 10.7*  HGB 7.6* 7.5* 8.2*  HCT 22.8* 22.1* 23.8*  MCV 99.6 97.4 96.0  PLT 278 291 672    Basic Metabolic Panel: Recent Labs  Lab 03/22/19 0540 03/23/19 0538 03/24/19 0747  NA 131* 126* 130*  K 4.6 4.9 4.2  CL 98 94* 96*  CO2 24 19* 23  GLUCOSE 112* 225* 135*  BUN 63* 73* 79*  CREATININE 5.67* 5.75* 5.70*  CALCIUM 8.3* 8.3* 8.8*  PHOS 3.9 2.6 3.5    Liver Function Tests: Recent Labs  Lab 03/22/19 0540 03/26/19 0538 03/24/19 0747  ALBUMIN 2.2* 2.4* 2.8*    CBG: Recent Labs  Lab 03-26-2019 2113 03/24/19 0656 03/24/19 1113  GLUCAP 286* 121* 182*    Microbiology Studies:   No results found for this or any previous visit (from the past 240 hour(s)).   Radiology Studies:  US Renal  Result Date: 03-26-2019 CLINICAL DATA:  Hydronephrosis EXAM: RENAL / URINARY TRACT ULTRASOUND COMPLETE  COMPARISON:  Ultrasound 03/13/2019, CT abdomen pelvis 03/14/2019 FINDINGS: Right Kidney: Renal measurements: 11.5 x 5.3 x 4.6 cm = volume: 147.5 mL. Moderate to severe hydronephrosis with cortical thinning. In the upper pole is a large exophytic 3 x 2.6 x 2.9 cm cystic focus which likely corresponds to a hyperdense focus on comparison CT likely reflecting hyperdense/proteinaceous cyst. No other worrisome renal lesions. No echogenic calculus. Left Kidney: Renal measurements: 13.5 x 6.0 x 5.0 = volume: 2210.4 mL. Moderate to severe left hydronephrosis. Associated cortical thinning. No concerning left renal mass. No echogenic calculus. Bladder: Patient reportedly voided prior to the examination with a bladder volume at the time of exam of approximately 1085 cc. Other: None. IMPRESSION: Moderate to severe bilateral hydronephrosis with associated cortical thinning. No visible echogenic urolithiasis. Patient reportedly voided prior to the sonography however bladder volume at the time of exam measures 1085 mL, correlate for symptoms of urinary retention and consider assessment of postvoid residual. 3 cm cystic focus in the upper pole right kidney corresponding well to a hyperdense structure on the comparison CT. Favors benign hyperdense/proteinaceous cyst though could consider six-month follow-up ultrasound to assess stability per CT recommendation. Electronically Signed   By: Lovena Le M.D.   On: 26-Mar-2019 20:22     Scheduled Meds:   . amLODipine  2.5 mg Oral Daily  . Chlorhexidine Gluconate Cloth  6 each Topical Daily  . darbepoetin (ARANESP) injection - NON-DIALYSIS  100 mcg Subcutaneous Q Thu-1800  . hydrocortisone   Topical BID  . hydrocortisone   Topical BID  . insulin aspart  0-5 Units Subcutaneous QHS  . insulin aspart  0-9 Units Subcutaneous TID WC  . predniSONE  60 mg Oral Q breakfast  . sodium bicarbonate  1,300 mg Oral BID  . tamsulosin  0.4 mg Oral QPC supper  . thiamine  100 mg Oral Daily     Or  . thiamine  100 mg Intravenous Daily    Continuous Infusions:     LOS: 11 days     Vernell Leep, MD, [place], [facility]. [facility]    To contact the attending provider between 7A-7P or the covering provider during after hours 7P-7A, please log into the web site [email or web address] and access using universal [place] password for that web site. If you do not have the password, please call the hospital operator.  03/24/2019, 1:12 PM

## 2019-03-24 NOTE — Progress Notes (Signed)
Rounded with Dr. Algis Liming.  Bladder scan patient with 855 ml.  Verbal order to insert foley for urinary retention.  900 ml urine output immediately.  RN to continue to monitor.

## 2019-03-24 NOTE — TOC Progression Note (Signed)
Transition of Care [facility]) - Progression Note    Patient Details  Name: Louis Butler MRN: [id number] Date of Birth: 1956-03-02  Transition of [facility]) CM/SW Contact  Rae Mar, RN Phone Number: 03/24/2019, 10:57 AM  Clinical Narrative:     CM consulted to get a PCP prior to D/C.  Most new patient appointments are scheduled rather far out.  Pt also lives in [place].  CM placed a reminder on AVS to call [employer] to get a list of in-network providers to establish care with or to call providers in the pt's area and ask if they are accepting new patients with [facility]. TOC appointment made at the [facility] on 12/31 to assist pt with hospital follow up until he can establish with a PCP in his area.  Appointment on AVS.  TOC team will be available for further needs if they arise.  Expected Discharge Plan: Home/Self Care Barriers to Discharge: Continued Medical Work up  Expected Discharge Plan and Services Expected Discharge Plan: Home/Self Care     Post Acute Care Choice: NA Living arrangements for the past 2 months: Single Family Home                                       Social Determinants of Health (SDOH) Interventions    Readmission Risk Interventions No flowsheet data found.

## 2019-03-24 NOTE — Plan of Care (Signed)
  Problem: Health Behavior/Discharge Planning: Goal: Ability to manage health-related needs will improve Outcome: Progressing   Problem: Elimination: Goal: Will not experience complications related to bowel motility Outcome: Progressing   

## 2019-03-24 NOTE — Progress Notes (Signed)
[name] NEPHROLOGY PROGRESS NOTE  Assessment/ Plan: Pt is a 63 y.o. yo male with history of HTN, DM, admitted with bilateral lower extremity rash, AKI with a creatinine level of 5.04.  #Acute kidney injury versus progressive CKD: Nonoliguric.  UA with RBC and serology test consistent with negative ANA, ANCA, dsDNA Ab, MPO, C4, kappa lambda ratio.  C3 level is mildly reduced.    The preliminary result of kidney biopsy showed acute tubular interstitial nephritis in addition to early/mild diabetic kidney disease.  Received IV Solu-Medrol on 12/15.  Continue prednisone with plan to taper. Repeat ultrasound with worsening hydronephrosis and acute urinary retention.  Recommend urology consult.  Discussed with the primary team. Creatinine level is stable.  Volume status acceptable and has no uremia.  No indication for dialysis.  # Acute urinary retention and bilateral hydronephrosis seen on imaging studies: Worsening in repeat imaging studies. Foley catheter and needs urology eval.  #Hyponatremia, euvolemic, appears due to reduced free water excretion in the setting of AKI and from chronic alcohol use, beer potomania.  Recommend to continue fluid restriction and increase intake of solute.  Monitor lab.  #Metabolic acidosis: Continue sodium bicarbonate.  #Anemia of CKD: Iron saturation 38%.  Aranesp.  #Hypertension: On amlodipine.  BP acceptable.  Subjective: Seen and examined at bedside.  Continue to feel better.  Urine output 1700 cc recorded.  Denies nausea, vomiting, chest pain, shortness of breath.  Objective Vital signs in last 24 hours: Vitals:   03/23/19 0900 03/23/19 1500 03/23/19 2112 03/24/19 0617  BP: 135/74 133/69 (!) 143/78 130/70  Pulse: 82 67 81 79  Resp: 18 18 18 18   Temp: 97.7 F (36.5 C) 97.9 F (36.6 C) 97.6 F (36.4 C) 98.2 F (36.8 C)  TempSrc:  Oral  Oral  SpO2: 99% 98% 97% 99%  Weight:   98.1 kg   Height:       Weight change: 0.005  kg  Intake/Output Summary (Last 24 hours) at 03/24/2019 1037 Last data filed at 03/24/2019 0914 Gross per 24 hour  Intake 840 ml  Output 2050 ml  Net -1210 ml       Labs: Basic Metabolic Panel: Recent Labs  Lab 03/22/19 0540 03/23/19 0538 03/24/19 0747  NA 131* 126* 130*  K 4.6 4.9 4.2  CL 98 94* 96*  CO2 24 19* 23  GLUCOSE 112* 225* 135*  BUN 63* 73* 79*  CREATININE 5.67* 5.75* 5.70*  CALCIUM 8.3* 8.3* 8.8*  PHOS 3.9 2.6 3.5   Liver Function Tests: Recent Labs  Lab 03/22/19 0540 03/23/19 0538 03/24/19 0747  ALBUMIN 2.2* 2.4* 2.8*   No results for input(s): LIPASE, AMYLASE in the last 168 hours. No results for input(s): AMMONIA in the last 168 hours. CBC: Recent Labs  Lab 03/20/19 0447 03/21/19 0550 03/22/19 0540 03/23/19 0538 03/24/19 0747  WBC 7.2 6.2 6.0 7.4 10.7*  HGB 7.7* 7.4* 7.6* 7.5* 8.2*  HCT 22.8* 22.2* 22.8* 22.1* 23.8*  MCV 99.1 99.6 99.6 97.4 96.0  PLT 211 233 278 291 368   Cardiac Enzymes: No results for input(s): CKTOTAL, CKMB, CKMBINDEX, TROPONINI in the last 168 hours. CBG: Recent Labs  Lab 03/23/19 0718 03/23/19 1121 03/23/19 1614 03/23/19 2113 03/24/19 0656  GLUCAP 185* 206* 242* 286* 121*    Iron Studies: No results for input(s): IRON, TIBC, TRANSFERRIN, FERRITIN in the last 72 hours. Studies/Results: US Renal  Result Date: 03/23/2019 CLINICAL DATA:  Hydronephrosis EXAM: RENAL / URINARY TRACT ULTRASOUND COMPLETE COMPARISON:  Ultrasound  03/13/2019, CT abdomen pelvis 03/14/2019 FINDINGS: Right Kidney: Renal measurements: 11.5 x 5.3 x 4.6 cm = volume: 147.5 mL. Moderate to severe hydronephrosis with cortical thinning. In the upper pole is a large exophytic 3 x 2.6 x 2.9 cm cystic focus which likely corresponds to a hyperdense focus on comparison CT likely reflecting hyperdense/proteinaceous cyst. No other worrisome renal lesions. No echogenic calculus. Left Kidney: Renal measurements: 13.5 x 6.0 x 5.0 = volume: 2210.4 mL.  Moderate to severe left hydronephrosis. Associated cortical thinning. No concerning left renal mass. No echogenic calculus. Bladder: Patient reportedly voided prior to the examination with a bladder volume at the time of exam of approximately 1085 cc. Other: None. IMPRESSION: Moderate to severe bilateral hydronephrosis with associated cortical thinning. No visible echogenic urolithiasis. Patient reportedly voided prior to the sonography however bladder volume at the time of exam measures 1085 mL, correlate for symptoms of urinary retention and consider assessment of postvoid residual. 3 cm cystic focus in the upper pole right kidney corresponding well to a hyperdense structure on the comparison CT. Favors benign hyperdense/proteinaceous cyst though could consider six-month follow-up ultrasound to assess stability per CT recommendation. Electronically Signed   By: Lovena Le M.D.   On: 03/23/2019 20:22    Medications: Infusions:   Scheduled Medications: . amLODipine  2.5 mg Oral Daily  . Chlorhexidine Gluconate Cloth  6 each Topical Daily  . darbepoetin (ARANESP) injection - NON-DIALYSIS  100 mcg Subcutaneous Q Thu-1800  . hydrocortisone   Topical BID  . hydrocortisone   Topical BID  . insulin aspart  0-5 Units Subcutaneous QHS  . insulin aspart  0-9 Units Subcutaneous TID WC  . predniSONE  60 mg Oral Q breakfast  . sodium bicarbonate  1,300 mg Oral BID  . tamsulosin  0.4 mg Oral QPC supper  . thiamine  100 mg Oral Daily   Or  . thiamine  100 mg Intravenous Daily    have reviewed scheduled and prn medications.  Physical Exam: General: Not in distress, comfortable Heart:RRR, s1s2 nl Lungs: Clear b/l, no crackle Abdomen:soft, Non-tender, non-distended Extremities:No LE edema, lower extremity rash around ankle area. Neurology: Alert, awake and following commands  Louis Butler 03/24/2019,10:37 AM  LOS: 11 days  Pager: [phone]

## 2019-03-25 DIAGNOSIS — N1330 Unspecified hydronephrosis: Secondary | ICD-10-CM

## 2019-03-25 LAB — RENAL FUNCTION PANEL
Albumin: 2.8 g/dL — ABNORMAL LOW (ref 3.5–5.0)
Anion gap: 12 (ref 5–15)
BUN: 80 mg/dL — ABNORMAL HIGH (ref 8–23)
CO2: 23 mmol/L (ref 22–32)
Calcium: 8.6 mg/dL — ABNORMAL LOW (ref 8.9–10.3)
Chloride: 96 mmol/L — ABNORMAL LOW (ref 98–111)
Creatinine, Ser: 5.23 mg/dL — ABNORMAL HIGH (ref 0.61–1.24)
GFR calc Af Amer: 13 mL/min — ABNORMAL LOW (ref >60)
GFR calc non Af Amer: 11 mL/min — ABNORMAL LOW (ref >60)
Glucose, Bld: 120 mg/dL — ABNORMAL HIGH (ref 70–99)
Phosphorus: 3.1 mg/dL (ref 2.5–4.6)
Potassium: 4.3 mmol/L (ref 3.5–5.1)
Sodium: 131 mmol/L — ABNORMAL LOW (ref 135–145)

## 2019-03-25 LAB — GLUCOSE, CAPILLARY
Glucose-Capillary: 118 mg/dL — ABNORMAL HIGH (ref 70–99)
Glucose-Capillary: 200 mg/dL — ABNORMAL HIGH (ref 70–99)
Glucose-Capillary: 323 mg/dL — ABNORMAL HIGH (ref 70–99)
Glucose-Capillary: 420 mg/dL — ABNORMAL HIGH (ref 70–99)

## 2019-03-25 LAB — CBC
HCT: 24.9 % — ABNORMAL LOW (ref 39.0–52.0)
Hemoglobin: 8.4 g/dL — ABNORMAL LOW (ref 13.0–17.0)
MCH: 33.2 pg (ref 26.0–34.0)
MCHC: 33.7 g/dL (ref 30.0–36.0)
MCV: 98.4 fL (ref 80.0–100.0)
Platelets: 397 10*3/uL (ref 150–400)
RBC: 2.53 MIL/uL — ABNORMAL LOW (ref 4.22–5.81)
RDW: 14.4 % (ref 11.5–15.5)
WBC: 12.3 10*3/uL — ABNORMAL HIGH (ref 4.0–10.5)
nRBC: 0 % (ref 0.0–0.2)

## 2019-03-25 MED ORDER — PREDNISONE 50 MG PO TABS
50.0000 mg | ORAL_TABLET | Freq: Every day | ORAL | Status: DC
  Administered 2019-03-26 – 2019-03-28 (×3): 50 mg via ORAL
  Filled 2019-03-25 (×3): qty 1

## 2019-03-25 MED ORDER — INSULIN ASPART 100 UNIT/ML ~~LOC~~ SOLN
0.0000 [IU] | Freq: Three times a day (TID) | SUBCUTANEOUS | Status: DC
  Administered 2019-03-26: 2 [IU] via SUBCUTANEOUS
  Administered 2019-03-26: 8 [IU] via SUBCUTANEOUS
  Administered 2019-03-27: 11 [IU] via SUBCUTANEOUS
  Administered 2019-03-27 – 2019-03-28 (×3): 3 [IU] via SUBCUTANEOUS

## 2019-03-25 MED ORDER — INSULIN ASPART 100 UNIT/ML ~~LOC~~ SOLN
4.0000 [IU] | Freq: Three times a day (TID) | SUBCUTANEOUS | Status: DC
  Administered 2019-03-26 – 2019-03-28 (×7): 4 [IU] via SUBCUTANEOUS

## 2019-03-25 MED ORDER — INSULIN ASPART 100 UNIT/ML ~~LOC~~ SOLN
0.0000 [IU] | Freq: Every day | SUBCUTANEOUS | Status: DC
  Administered 2019-03-25: 5 [IU] via SUBCUTANEOUS
  Administered 2019-03-26 – 2019-03-27 (×2): 4 [IU] via SUBCUTANEOUS

## 2019-03-25 MED ORDER — INSULIN ASPART 100 UNIT/ML ~~LOC~~ SOLN
5.0000 [IU] | Freq: Once | SUBCUTANEOUS | Status: AC
  Administered 2019-03-25: 5 [IU] via SUBCUTANEOUS

## 2019-03-25 NOTE — Plan of Care (Signed)
  Problem: Elimination: Goal: Will not experience complications related to bowel motility Outcome: Progressing   

## 2019-03-25 NOTE — Progress Notes (Signed)
Results for [name], [name] (MRN [id number]) as of 03/25/2019 14:52  Ref. Range 03/24/2019 11:13 03/24/2019 16:07 03/24/2019 20:53 03/25/2019 07:28 03/25/2019 11:24  Glucose-Capillary Latest Ref Range: 70 - 99 mg/dL 182 (H) 339 (H) 269 (H) 118 (H) 200 (H)  Noted that postprandial blood sugars are greater than 180 mg/dl  Recommend adding Novolog 4 units TID as meal coverage if patient eats at least 50% of meal.  May need to increase Novolog correction scale to MODERATE TID & Hs scale if blood sugars continue to be elevated.  Harvel Ricks RN BSN CDE Diabetes Coordinator Pager: [phone]  8am-5pm

## 2019-03-25 NOTE — Progress Notes (Signed)
PROGRESS NOTE   GAELEN BRAGER  AOZ:[id number]    DOB: [birth year]    DOA: 03/13/2019  PCP: Patient, [name]   I have briefly reviewed patients previous medical records in [facility].  Chief Complaint:   Chief Complaint  Patient presents with  . Foot Pain    Brief Narrative:  63 year old married male, lives with his spouse and granddaughter, independent, PMH of DM2, HTN,?  Arrhythmia, UTI who has not seen PCP for at least 2 years and not on any prescription medications at home, presented to [facility] via [place] [facility] on 03/13/2019 due to bilateral lower extremity erythematous rash of 2 to 4 weeks duration with swelling and pain.  Admitted for acute kidney injury, suspected due to vasculitic process, acute anemia without bleeding, urinary retention and concern for UTI.  S/p IR guided renal biopsy on 12/11.  Nephrology consulting, steroids initiated 12/15.  Recurrent acute urinary retention with bilateral moderate to severe hydronephrosis, placed indwelling Foley catheter again on 12/17 and consulted urology.  Creatinine slowly improving.   Assessment & Plan:  Principal Problem:   Acute renal failure (ARF) (HCC) Active Problems:   Diabetes mellitus (HCC)   Hypertension   Non compliance w medication regimen   Tobacco use   Rash of both feet   Hyponatremia   Epistaxis   Purpura (HCC)   Metabolic acidosis, normal anion gap (NAG)   Constipation   Acute kidney injury complicating chronic kidney disease  Nephrology consulting and assisting with management.  Creatinine peaked to 5.6 on 12/15.  Baseline creatinine 1.21 in 11/2016.  Has great urine output.  5.8 L urine output yesterday  C3 slightly decreased, hep C/anti-GBM/hep B/HIV/ANA unremarkable,  As per nephrology, kidney biopsy showed acute tubular interstitial nephritis in addition to early/mild diabetic kidney disease.  I am unable to find final pathology results in [facility] yet.  S/p IV Solu-Medrol  x1 dose on 12/15 and now transitioned to oral prednisone, reduced to 50 mg daily  Creatinine finally starting to improve, down from 5.7 range over the previous couple days to 5.3.  Not clinically uremic and no urgent indicators for dialysis.  Urinary retention could have also contributed to above.  Monitor BMP.  Urinary retention and bilateral hydronephrosis  Renal ultrasound 12/6: Bilateral hydronephrosis, right greater than left.  No shadowing stone.  Increased renal parenchyma echogenicity in keeping with chronic kidney disease.  CT abdomen pelvis without contrast 12/7: Bilateral mild hydroureteral nephrosis to the level of urinary bladder.  Patient had some urinary irritation post Foley removal which may have resolved.  No clinical UTI.  Monitor.  Repeat renal ultrasound 12/16: Moderate to severe bilateral hydronephrosis with associated cortical thickening.  Post void at renal ultrasound showed urinary retention of 1085 mL.  Foley catheter placed back on 12/17 for acute urinary retention.  Urology consultation appreciated.  Increased Flomax to 0.4 mg twice daily and will follow up in office in a week's time.  Right renal mass  Noted on CT abdomen and pelvis without contrast 12/7.  Radiology reports most consistent with a cyst based on renal ultrasound.  They recommend considering surveillance with renal ultrasound at 6 months and attention to right renal lesion.  As per repeat renal ultrasound 12/16: 3 cm cystic focus in the upper pole right kidney corresponding well for hyperdense structure on the comparison CT favors benign hyperdense/proteinaceous cyst though recommend considering 33-month follow-up ultrasound to assess stability.  Nonblanching purpuric rash  Noted on  bilateral ankle, feet and in the neck area.  Most concerning for vasculitic process in the setting of acute kidney injury.  Serology results as noted above.  S/p IR guided biopsy on 12/11.  Currently on  steroids p.o. and topical and rash seems to be improving.  Non-anion gap metabolic acidosis  Secondary to acute kidney injury.  Nephrology has increased sodium bicarbonate to 1300 mg twice daily.  Resolved.  Normocytic anemia  Possibly due to chronic disease and chronic kidney disease.  Iron panel with normal iron and decreased TIBC.  ESA per nephrology.  Hemoglobin stable in the low 8 g range for the last 2 days.  Hyponatremia  Possibly due to acute kidney injury.  Sodium fluctuating in the mid 120s-low 130 range.  Asymptomatic of same.  Continue sodium bicarbonate.  Management per nephrology.  Sodium stable in the 130s.  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  CBGs have worsened.  DM coordinator input appreciated.  Will change SSI to moderate sensitivity with bedtime scale and added mealtime NovoLog 4 units and monitor closely.  Essential hypertension  Reasonably controlled on amlodipine 2.5 mg daily.  Reported epistaxis  Resolved  Tobacco/alcohol dependence  Cessation counseled.  Reported binge drinking on weekends PTA.  No withdrawal.    DVT prophylaxis: SCDs Code Status: Full Family Communication: Discussed in detail with patient spouse via speaker phone at bedside.  Updated care and answered questions. Disposition: To home pending clinical improvement and nephrology clearance, possibly another 3 to 4 days as discussed with nephrology today.   Consultants:   Nephrology IR Urology  Procedures:   Ultrasound-guided left renal biopsy by IR on 12/17. Foley catheter placed 12/17.  Antimicrobials:   None   Subjective:  Denies complaints.  No pain reported.  Objective:   Vitals:   03/24/19 1300 03/24/19 2014 03/25/19 0501 03/25/19 0900  BP: 135/72 (!) 145/84 (!) 147/76 (!) 146/78  Pulse: 80 78 64 69  Resp: 18 16 16 18   Temp: 98 F (36.7 C) 98.2 F (36.8 C) 97.9 F (36.6 C) 98 F (36.7 C)  TempSrc: Oral Oral Oral  Oral  SpO2: 100% 98% 99% 100%  Weight:  95.1 kg    Height:        General exam: Pleasant middle-age male, moderately built and nourished lying comfortably supine in bed without distress.  Oral mucosa moist. Respiratory system: Clear to auscultation.  No increased work of breathing. Cardiovascular system: S1 and S2 heard, RRR.  No JVD, murmurs or pedal edema. Gastrointestinal system: Abdomen is nondistended, soft and nontender. No organomegaly or masses felt. Normal bowel sounds heard.  Bladder not palpable. Central nervous system: Alert and oriented. No focal neurological deficits. Extremities: Symmetric 5 x 5 power. Skin: Mildly erythematous maculopapular rash noted to bilateral ankle and feet, right side of neck but that on cheek seems to have resolved. Psychiatry: Judgement and insight appear normal. Mood & affect appropriate.     Data Reviewed:   I have personally reviewed following labs and imaging studies   CBC: Recent Labs  Lab 03/23/19 0538 03/24/19 0747 03/25/19 0512  WBC 7.4 10.7* 12.3*  HGB 7.5* 8.2* 8.4*  HCT 22.1* 23.8* 24.9*  MCV 97.4 96.0 98.4  PLT 291 368 449    Basic Metabolic Panel: Recent Labs  Lab 03/23/19 0538 03/24/19 0747 03/25/19 0512  NA 126* 130* 131*  K 4.9 4.2 4.3  CL 94* 96* 96*  CO2 19* 23 23  GLUCOSE 225* 135*  120*  BUN 73* 79* 80*  CREATININE 5.75* 5.70* 5.23*  CALCIUM 8.3* 8.8* 8.6*  PHOS 2.6 3.5 3.1    Liver Function Tests: Recent Labs  Lab 03/23/19 0538 03/24/19 0747 03/25/19 0512  ALBUMIN 2.4* 2.8* 2.8*    CBG: Recent Labs  Lab 03/24/19 2053 03/25/19 0728 03/25/19 1124  GLUCAP 269* 118* 200*    Microbiology Studies:   No results found for this or any previous visit (from the past 240 hour(s)).   Radiology Studies:  No results found.   Scheduled Meds:   . amLODipine  2.5 mg Oral Daily  . Chlorhexidine Gluconate Cloth  6 each Topical Daily  . darbepoetin (ARANESP) injection - NON-DIALYSIS  100 mcg  Subcutaneous Q Thu-1800  . hydrocortisone   Topical BID  . hydrocortisone   Topical BID  . insulin aspart  0-5 Units Subcutaneous QHS  . insulin aspart  0-9 Units Subcutaneous TID WC  . [START ON 03/26/2019] predniSONE  50 mg Oral Q breakfast  . sodium bicarbonate  1,300 mg Oral BID  . tamsulosin  0.4 mg Oral BID  . thiamine  100 mg Oral Daily   Or  . thiamine  100 mg Intravenous Daily    Continuous Infusions:     LOS: 12 days     Vernell Leep, MD, [place], [facility]. [facility]    To contact the attending provider between 7A-7P or the covering provider during after hours 7P-7A, please log into the web site [email or web address] and access using universal [place] password for that web site. If you do not have the password, please call the hospital operator.  03/25/2019, 4:44 PM

## 2019-03-25 NOTE — Progress Notes (Signed)
[facility] NEPHROLOGY PROGRESS NOTE  Assessment/ Plan: Pt is a 63 y.o. yo male with history of HTN, DM, admitted with bilateral lower extremity rash, AKI with a creatinine level of 5.04.  #Acute kidney injury versus progressive CKD: Nonoliguric.  UA with RBC and serology test consistent with negative ANA, ANCA, dsDNA Ab, MPO, C4, kappa lambda ratio.  C3 level is mildly reduced.    The preliminary result of kidney biopsy showed acute tubular interstitial nephritis in addition to early/mild diabetic kidney disease.  Received IV Solu-Medrol on 12/15.  Lower prednisone dose to 50 mg today. Seen by urologist for hydronephrosis.  Foley catheter placed.  Significant urine output and creatinine level trending down.  No uremic.  No need for dialysis.  Continue to monitor.  # Acute urinary retention and bilateral hydronephrosis seen on imaging studies: Seen by urologist and has Foley catheter for urinary retention.  Now robust urine output.  #Hyponatremia, euvolemic, appears due to reduced free water excretion in the setting of AKI and from chronic alcohol use, beer potomania.  Recommend to continue fluid restriction and increase intake of solute.  Monitor lab.  #Metabolic acidosis: Continue sodium bicarbonate.  #Anemia of CKD: Iron saturation 38%.  Aranesp.  #Hypertension: On amlodipine.  BP acceptable.  Subjective: Seen and examined at bedside.  Foley catheter was placed yesterday for urinary retention.  Feels good.  Denies nausea vomiting chest pain shortness of breath.  Objective Vital signs in last 24 hours: Vitals:   03/24/19 1300 03/24/19 2014 03/25/19 0501 03/25/19 0900  BP: 135/72 (!) 145/84 (!) 147/76 (!) 146/78  Pulse: 80 78 64 69  Resp: 18 16 16 18   Temp: 98 F (36.7 C) 98.2 F (36.8 C) 97.9 F (36.6 C) 98 F (36.7 C)  TempSrc: Oral Oral Oral Oral  SpO2: 100% 98% 99% 100%  Weight:  95.1 kg    Height:       Weight change: -3.005 kg  Intake/Output Summary  (Last 24 hours) at 03/25/2019 1155 Last data filed at 03/25/2019 0917 Gross per 24 hour  Intake 920 ml  Output 4500 ml  Net -3580 ml       Labs: Basic Metabolic Panel: Recent Labs  Lab 03/23/19 0538 03/24/19 0747 03/25/19 0512  NA 126* 130* 131*  K 4.9 4.2 4.3  CL 94* 96* 96*  CO2 19* 23 23  GLUCOSE 225* 135* 120*  BUN 73* 79* 80*  CREATININE 5.75* 5.70* 5.23*  CALCIUM 8.3* 8.8* 8.6*  PHOS 2.6 3.5 3.1   Liver Function Tests: Recent Labs  Lab 03/23/19 0538 03/24/19 0747 03/25/19 0512  ALBUMIN 2.4* 2.8* 2.8*   No results for input(s): LIPASE, AMYLASE in the last 168 hours. No results for input(s): AMMONIA in the last 168 hours. CBC: Recent Labs  Lab 03/21/19 0550 03/22/19 0540 03/23/19 0538 03/24/19 0747 03/25/19 0512  WBC 6.2 6.0 7.4 10.7* 12.3*  HGB 7.4* 7.6* 7.5* 8.2* 8.4*  HCT 22.2* 22.8* 22.1* 23.8* 24.9*  MCV 99.6 99.6 97.4 96.0 98.4  PLT 233 278 291 368 397   Cardiac Enzymes: No results for input(s): CKTOTAL, CKMB, CKMBINDEX, TROPONINI in the last 168 hours. CBG: Recent Labs  Lab 03/24/19 1113 03/24/19 1607 03/24/19 2053 03/25/19 0728 03/25/19 1124  GLUCAP 182* 339* 269* 118* 200*    Iron Studies: No results for input(s): IRON, TIBC, TRANSFERRIN, FERRITIN in the last 72 hours. Studies/Results: US Renal  Result Date: 03/23/2019 CLINICAL DATA:  Hydronephrosis EXAM: RENAL / URINARY TRACT ULTRASOUND COMPLETE COMPARISON:  Ultrasound 03/13/2019, CT abdomen pelvis 03/14/2019 FINDINGS: Right Kidney: Renal measurements: 11.5 x 5.3 x 4.6 cm = volume: 147.5 mL. Moderate to severe hydronephrosis with cortical thinning. In the upper pole is a large exophytic 3 x 2.6 x 2.9 cm cystic focus which likely corresponds to a hyperdense focus on comparison CT likely reflecting hyperdense/proteinaceous cyst. No other worrisome renal lesions. No echogenic calculus. Left Kidney: Renal measurements: 13.5 x 6.0 x 5.0 = volume: 2210.4 mL. Moderate to severe left  hydronephrosis. Associated cortical thinning. No concerning left renal mass. No echogenic calculus. Bladder: Patient reportedly voided prior to the examination with a bladder volume at the time of exam of approximately 1085 cc. Other: None. IMPRESSION: Moderate to severe bilateral hydronephrosis with associated cortical thinning. No visible echogenic urolithiasis. Patient reportedly voided prior to the sonography however bladder volume at the time of exam measures 1085 mL, correlate for symptoms of urinary retention and consider assessment of postvoid residual. 3 cm cystic focus in the upper pole right kidney corresponding well to a hyperdense structure on the comparison CT. Favors benign hyperdense/proteinaceous cyst though could consider six-month follow-up ultrasound to assess stability per CT recommendation. Electronically Signed   By: Lovena Le M.D.   On: 03/23/2019 20:22    Medications: Infusions:   Scheduled Medications: . amLODipine  2.5 mg Oral Daily  . Chlorhexidine Gluconate Cloth  6 each Topical Daily  . darbepoetin (ARANESP) injection - NON-DIALYSIS  100 mcg Subcutaneous Q Thu-1800  . hydrocortisone   Topical BID  . hydrocortisone   Topical BID  . insulin aspart  0-5 Units Subcutaneous QHS  . insulin aspart  0-9 Units Subcutaneous TID WC  . [START ON 03/26/2019] predniSONE  50 mg Oral Q breakfast  . sodium bicarbonate  1,300 mg Oral BID  . tamsulosin  0.4 mg Oral BID  . thiamine  100 mg Oral Daily   Or  . thiamine  100 mg Intravenous Daily    have reviewed scheduled and prn medications.  Physical Exam: General: Lying on bed comfortable, not in distress Heart:RRR, s1s2 nl, no rubs Lungs: Clear b/l, no crackle Abdomen:soft, Non-tender, non-distended Extremities:No LE edema, lower extremity rash around ankle area. Neurology: Alert, awake and following commands  Sinaya Minogue [name] 03/25/2019,11:55 AM  LOS: 12 days  Pager: [phone]

## 2019-03-26 LAB — BASIC METABOLIC PANEL
Anion gap: 11 (ref 5–15)
BUN: 82 mg/dL — ABNORMAL HIGH (ref 8–23)
CO2: 26 mmol/L (ref 22–32)
Calcium: 8.5 mg/dL — ABNORMAL LOW (ref 8.9–10.3)
Chloride: 97 mmol/L — ABNORMAL LOW (ref 98–111)
Creatinine, Ser: 5.06 mg/dL — ABNORMAL HIGH (ref 0.61–1.24)
GFR calc Af Amer: 13 mL/min — ABNORMAL LOW (ref >60)
GFR calc non Af Amer: 11 mL/min — ABNORMAL LOW (ref >60)
Glucose, Bld: 127 mg/dL — ABNORMAL HIGH (ref 70–99)
Potassium: 4.7 mmol/L (ref 3.5–5.1)
Sodium: 134 mmol/L — ABNORMAL LOW (ref 135–145)

## 2019-03-26 LAB — GLUCOSE, CAPILLARY
Glucose-Capillary: 115 mg/dL — ABNORMAL HIGH (ref 70–99)
Glucose-Capillary: 143 mg/dL — ABNORMAL HIGH (ref 70–99)
Glucose-Capillary: 255 mg/dL — ABNORMAL HIGH (ref 70–99)
Glucose-Capillary: 340 mg/dL — ABNORMAL HIGH (ref 70–99)

## 2019-03-26 MED ORDER — SODIUM CHLORIDE 0.9 % IV SOLN: INTRAVENOUS | Status: DC

## 2019-03-26 NOTE — Progress Notes (Signed)
[facility] NEPHROLOGY PROGRESS NOTE  Assessment/ Plan: Pt is a 63 y.o. yo male with history of HTN, DM, admitted with bilateral lower extremity rash, AKI with a creatinine level of 5.04.  #Acute kidney injury versus progressive CKD: Nonoliguric.  UA with RBC and serology test consistent with negative ANA, ANCA, dsDNA Ab, MPO, C4, kappa lambda ratio.  C3 level is mildly reduced.    The preliminary result of kidney biopsy showed acute tubular interstitial nephritis in addition to early/mild diabetic kidney disease.  Received IV Solu-Medrol on 12/15.  Lower prednisone dose to 50 mg 12/19. Seen by urologist for hydronephrosis.  Foley catheter placed.  Significant urine output and creatinine level trending down.  Not uremic.  No need for dialysis.  Continue to monitor. -looks dry, start IVF  # Acute urinary retention and bilateral hydronephrosis seen on imaging studies: Seen by urologist and has Foley catheter for urinary retention.  Now robust urine output.  #Hyponatremia, euvolemic, appears due to reduced free water excretion in the setting of AKI and from chronic alcohol use, beer potomania. Improving, Monitor lab.  #Metabolic acidosis: improved, dc sodium bicarbonate.  #Anemia of CKD: Iron saturation 38%.  Continue Aranesp.  #Hypertension: On amlodipine.  BP acceptable.  Discussed with primary team.  Subjective: Seen and examined at bedside.  No new event.  Urine output of 4750 cc.  Denies nausea vomiting chest pain shortness of breath.  No edema.  Objective Vital signs in last 24 hours: Vitals:   03/25/19 0900 03/25/19 2139 03/26/19 0527 03/26/19 0939  BP: (!) 146/78 133/69 123/79 121/69  Pulse: 69 75 73 76  Resp: 18 20 12 18   Temp: 98 F (36.7 C) 98.6 F (37 C) 98.7 F (37.1 C) 98.3 F (36.8 C)  TempSrc: Oral Oral Oral Oral  SpO2: 100% 97% 96% 99%  Weight:  96.3 kg    Height:       Weight change: 1.2 kg  Intake/Output Summary (Last 24 hours) at 03/26/2019  1113 Last data filed at 03/26/2019 1000 Gross per 24 hour  Intake 1300 ml  Output 4650 ml  Net -3350 ml       Labs: Basic Metabolic Panel: Recent Labs  Lab 03/23/19 0538 03/24/19 0747 03/25/19 0512 03/26/19 0621  NA 126* 130* 131* 134*  K 4.9 4.2 4.3 4.7  CL 94* 96* 96* 97*  CO2 19* 23 23 26   GLUCOSE 225* 135* 120* 127*  BUN 73* 79* 80* 82*  CREATININE 5.75* 5.70* 5.23* 5.06*  CALCIUM 8.3* 8.8* 8.6* 8.5*  PHOS 2.6 3.5 3.1  --    Liver Function Tests: Recent Labs  Lab 03/23/19 0538 03/24/19 0747 03/25/19 0512  ALBUMIN 2.4* 2.8* 2.8*   No results for input(s): LIPASE, AMYLASE in the last 168 hours. No results for input(s): AMMONIA in the last 168 hours. CBC: Recent Labs  Lab 03/21/19 0550 03/22/19 0540 03/23/19 0538 03/24/19 0747 03/25/19 0512  WBC 6.2 6.0 7.4 10.7* 12.3*  HGB 7.4* 7.6* 7.5* 8.2* 8.4*  HCT 22.2* 22.8* 22.1* 23.8* 24.9*  MCV 99.6 99.6 97.4 96.0 98.4  PLT 233 278 291 368 397   Cardiac Enzymes: No results for input(s): CKTOTAL, CKMB, CKMBINDEX, TROPONINI in the last 168 hours. CBG: Recent Labs  Lab 03/25/19 0728 03/25/19 1124 03/25/19 1642 03/25/19 2135 03/26/19 0657  GLUCAP 118* 200* 323* 420* 115*    Iron Studies: No results for input(s): IRON, TIBC, TRANSFERRIN, FERRITIN in the last 72 hours. Studies/Results: No results found.  Medications: Infusions: .  sodium chloride      Scheduled Medications: . amLODipine  2.5 mg Oral Daily  . Chlorhexidine Gluconate Cloth  6 each Topical Daily  . darbepoetin (ARANESP) injection - NON-DIALYSIS  100 mcg Subcutaneous Q Thu-1800  . hydrocortisone   Topical BID  . hydrocortisone   Topical BID  . insulin aspart  0-15 Units Subcutaneous TID WC  . insulin aspart  0-5 Units Subcutaneous QHS  . insulin aspart  4 Units Subcutaneous TID WC  . predniSONE  50 mg Oral Q breakfast  . tamsulosin  0.4 mg Oral BID  . thiamine  100 mg Oral Daily   Or  . thiamine  100 mg Intravenous Daily     have reviewed scheduled and prn medications.  Physical Exam: General: Lying on bed, comfortable Heart:RRR, s1s2 nl, no rubs Lungs: Clear b/l, no crackle Abdomen:soft, Non-tender, non-distended Extremities:No LE edema, lower extremity rash around ankle area. Neurology: Alert, awake and following commands  Louis Butler Louis Butler 03/26/2019,11:13 AM  LOS: 13 days  Pager: [phone]

## 2019-03-26 NOTE — Progress Notes (Signed)
PROGRESS NOTE   LC JOYNT  DPO:[id number]    DOB: 1955/06/07    DOA: 03/13/2019  PCP: Patient, [name]   I have briefly reviewed patients previous medical records in [facility].  Chief Complaint:   Chief Complaint  Patient presents with  . Foot Pain    Brief Narrative:  63 year old married male, lives with his spouse and granddaughter, independent, PMH of DM2, HTN,?  Arrhythmia, UTI who has not seen PCP for at least 2 years and not on any prescription medications at home, presented to [facility] via [place] [facility] on 03/13/2019 due to bilateral lower extremity erythematous rash of 2 to 4 weeks duration with swelling and pain.  Admitted for acute kidney injury, suspected due to vasculitic process, acute anemia without bleeding, urinary retention and concern for UTI.  S/p IR guided renal biopsy on 12/11.  Nephrology consulting, steroids initiated 12/15.  Recurrent acute urinary retention with bilateral moderate to severe hydronephrosis, placed indwelling Foley catheter again on 12/17 and consulted urology.  Creatinine continues to steadily improve.   Assessment & Plan:  Principal Problem:   Acute renal failure (ARF) (HCC) Active Problems:   Diabetes mellitus (HCC)   Hypertension   Non compliance w medication regimen   Tobacco use   Rash of both feet   Hyponatremia   Epistaxis   Purpura (HCC)   Metabolic acidosis, normal anion gap (NAG)   Constipation   Acute kidney injury complicating chronic kidney disease  Nephrology consulting and assisting with management.  Creatinine peaked to 5.6 on 12/15.  Baseline creatinine 1.21 in 11/2016.  -15.4 L since admission.  UO: 4.7 L yesterday.  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  I am unable to find final pathology results in [facility] yet.  S/p IV  Solu-Medrol x1 dose on 12/15 and now transitioned to oral prednisone, reduced to 50 mg daily  Creatinine finally starting to improve, down from 5.7 range over the previous couple days to 5.06.  Discussed with Dr. Carolin Sicks, suspects that he is dry from postobstructive diuresis and unable to keep up by oral intake and hence starting IV fluids.  Not clinically uremic and no urgent indicators for dialysis.  Follow BMP closely.  Urinary retention and bilateral hydronephrosis  Renal ultrasound 12/6: Bilateral hydronephrosis, right greater than left.  No shadowing stone.  Increased renal parenchyma echogenicity in keeping with chronic kidney disease.  CT abdomen pelvis without contrast 12/7: Bilateral mild hydroureteral nephrosis to the level of urinary bladder.  Patient had some urinary irritation post Foley removal which may have resolved.  No clinical UTI.  Monitor.  Repeat renal ultrasound 12/16: Moderate to severe bilateral hydronephrosis with associated cortical thickening.  Post void at renal ultrasound showed urinary retention of 1085 mL.  Foley catheter placed back on 12/17 for acute urinary retention.  Urology consultation appreciated.  Increased Flomax to 0.4 mg twice daily and will follow up in office in a week's time.  Right renal mass  Noted on CT abdomen and pelvis without contrast 12/7.  Radiology reports most consistent with a cyst based on renal ultrasound.  They recommend considering surveillance with renal ultrasound at 6 months and attention to right renal lesion.  As per repeat renal ultrasound 12/16: 3 cm cystic focus in the upper pole right kidney corresponding well for hyperdense structure on the comparison CT favors  benign hyperdense/proteinaceous cyst though recommend considering 17-month follow-up ultrasound to assess stability.  Nonblanching purpuric rash  Noted on bilateral ankle, feet and in the neck area.  Most concerning for vasculitic process in the setting of  acute kidney injury.  Serology results as noted above.  S/p IR guided biopsy on 12/11.  Currently on steroids p.o. and topical.  Much improved.  Non-anion gap metabolic acidosis  Secondary to acute kidney injury.  Resolved.  Bicarbonate supplements discontinued.  Normocytic anemia  Possibly due to chronic disease and chronic kidney disease.  Iron panel with normal iron and decreased TIBC.  ESA per nephrology.  Hemoglobin stable in the low 8 g range for the last 2 days.  Hyponatremia  Possibly due to acute kidney injury.    Improving.  Sodium up to 134 today.  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  CBGs have worsened.  DM coordinator input appreciated.  Changed SSI to moderate sensitivity with bedtime scale and added mealtime NovoLog 4 units and monitor closely.  Better controlled.  CBGs this morning ranging between 115-143.  Essential hypertension  Reasonably controlled on amlodipine 2.5 mg daily.  Reported epistaxis  Resolved  Tobacco/alcohol dependence  Cessation counseled.  Reported binge drinking on weekends PTA.  No withdrawal.    DVT prophylaxis: SCDs Code Status: Full Family Communication: Discussed in detail with patient spouse on 12/18 via speaker phone at bedside.  Updated care and answered questions. Disposition: To home pending clinical improvement and nephrology clearance, possibly another 2-3 days.   Consultants:   Nephrology IR Urology  Procedures:   Ultrasound-guided left renal biopsy by IR on 12/17. Foley catheter placed 12/17.  Antimicrobials:   None   Subjective:  Reports that his neck skin rash has resolved.  Rash of his feet have also significantly improved.  Has no other complaints.  Objective:   Vitals:   03/25/19 0900 03/25/19 2139 03/26/19 0527 03/26/19 0939  BP: (!) 146/78 133/69 123/79 121/69  Pulse: 69 75 73 76  Resp: 18 20 12 18   Temp: 98 F (36.7 C) 98.6 F (37 C) 98.7 F (37.1  C) 98.3 F (36.8 C)  TempSrc: Oral Oral Oral Oral  SpO2: 100% 97% 96% 99%  Weight:  96.3 kg    Height:        General exam: Pleasant middle-age male, moderately built and nourished lying comfortably supine in bed without distress.  Oral mucosa borderline hydration. Respiratory system: Clear to auscultation.  No increased work of breathing. Cardiovascular system: S1 and S2 heard, RRR.  No JVD, murmurs or pedal edema. Gastrointestinal system: Abdomen is nondistended, soft and nontender. No organomegaly or masses felt. Normal bowel sounds heard.  Bladder not palpable. GU: Foley catheter in place. Central nervous system: Alert and oriented. No focal neurological deficits. Extremities: Symmetric 5 x 5 power. Skin: Mildly erythematous maculopapular rash noted to bilateral ankle and feet continues to improve. right side of neck but that on cheek seems to have resolved. Psychiatry: Judgement and insight appear normal. Mood & affect appropriate.     Data Reviewed:   I have personally reviewed following labs and imaging studies   CBC: Recent Labs  Lab 03/23/19 0538 03/24/19 0747 03/25/19 0512  WBC 7.4 10.7* 12.3*  HGB 7.5* 8.2* 8.4*  HCT 22.1* 23.8* 24.9*  MCV 97.4 96.0 98.4  PLT 291 368 614    Basic Metabolic Panel: Recent Labs  Lab 03/23/19 0538 03/24/19 0747 03/25/19 0512 03/26/19 0621  NA 126*  130* 131* 134*  K 4.9 4.2 4.3 4.7  CL 94* 96* 96* 97*  CO2 19* 23 23 26   GLUCOSE 225* 135* 120* 127*  BUN 73* 79* 80* 82*  CREATININE 5.75* 5.70* 5.23* 5.06*  CALCIUM 8.3* 8.8* 8.6* 8.5*  PHOS 2.6 3.5 3.1  --     Liver Function Tests: Recent Labs  Lab 03/23/19 0538 03/24/19 0747 03/25/19 0512  ALBUMIN 2.4* 2.8* 2.8*    CBG: Recent Labs  Lab 03/25/19 2135 03/26/19 0657 03/26/19 1129  GLUCAP 420* 115* 143*    Microbiology Studies:   No results found for this or any previous visit (from the past 240 hour(s)).   Radiology Studies:  No results  found.   Scheduled Meds:   . amLODipine  2.5 mg Oral Daily  . Chlorhexidine Gluconate Cloth  6 each Topical Daily  . darbepoetin (ARANESP) injection - NON-DIALYSIS  100 mcg Subcutaneous Q Thu-1800  . hydrocortisone   Topical BID  . hydrocortisone   Topical BID  . insulin aspart  0-15 Units Subcutaneous TID WC  . insulin aspart  0-5 Units Subcutaneous QHS  . insulin aspart  4 Units Subcutaneous TID WC  . predniSONE  50 mg Oral Q breakfast  . tamsulosin  0.4 mg Oral BID  . thiamine  100 mg Oral Daily   Or  . thiamine  100 mg Intravenous Daily    Continuous Infusions:   . sodium chloride 100 mL/hr at 03/26/19 1200     LOS: 13 days     Vernell Leep, MD, [place], [facility]. [facility]    To contact the attending provider between 7A-7P or the covering provider during after hours 7P-7A, please log into the web site [email or web address] and access using universal [place] password for that web site. If you do not have the password, please call the hospital operator.  03/26/2019, 2:48 PM

## 2019-03-27 LAB — GLUCOSE, CAPILLARY
Glucose-Capillary: 196 mg/dL — ABNORMAL HIGH (ref 70–99)
Glucose-Capillary: 149 mg/dL — ABNORMAL HIGH (ref 70–99)
Glucose-Capillary: 348 mg/dL — ABNORMAL HIGH (ref 70–99)
Glucose-Capillary: 346 mg/dL — ABNORMAL HIGH (ref 70–99)

## 2019-03-27 LAB — RENAL FUNCTION PANEL
Albumin: 2.3 g/dL — ABNORMAL LOW (ref 3.5–5.0)
Anion gap: 8 (ref 5–15)
BUN: 80 mg/dL — ABNORMAL HIGH (ref 8–23)
CO2: 23 mmol/L (ref 22–32)
Calcium: 7.8 mg/dL — ABNORMAL LOW (ref 8.9–10.3)
Chloride: 100 mmol/L (ref 98–111)
Creatinine, Ser: 4.75 mg/dL — ABNORMAL HIGH (ref 0.61–1.24)
GFR calc Af Amer: 14 mL/min — ABNORMAL LOW (ref >60)
GFR calc non Af Amer: 12 mL/min — ABNORMAL LOW (ref >60)
Glucose, Bld: 278 mg/dL — ABNORMAL HIGH (ref 70–99)
Phosphorus: 2.9 mg/dL (ref 2.5–4.6)
Potassium: 3.9 mmol/L (ref 3.5–5.1)
Sodium: 131 mmol/L — ABNORMAL LOW (ref 135–145)

## 2019-03-27 NOTE — Progress Notes (Deleted)
PROGRESS NOTE   KOY LAMP  EQA:[id number]    DOB: 24-May-1955    DOA: 03/13/2019  PCP: Patient, [name]   I have briefly reviewed patients previous medical records in [facility].  Chief Complaint:   Chief Complaint  Patient presents with  . Foot Pain    Brief Narrative:  63 year old married male, lives with his spouse and granddaughter, independent, PMH of DM2, HTN,?  Arrhythmia, UTI who has not seen PCP for at least 2 years and not on any prescription medications at home, presented to [facility] via [place] [facility] on 03/13/2019 due to bilateral lower extremity erythematous rash of 2 to 4 weeks duration with swelling and pain.  Admitted for acute kidney injury, suspected due to vasculitic process, acute anemia without bleeding, urinary retention and concern for UTI.  S/p IR guided renal biopsy on 12/11.  Nephrology consulting, steroids initiated 12/15.  Recurrent acute urinary retention with bilateral moderate to severe hydronephrosis, placed indwelling Foley catheter again on 12/17 and consulted urology.  Creatinine continues to steadily improve.   Assessment & Plan:  Principal Problem:   Acute renal failure (ARF) (HCC) Active Problems:   Diabetes mellitus (HCC)   Hypertension   Non compliance w medication regimen   Tobacco use   Rash of both feet   Hyponatremia   Epistaxis   Purpura (HCC)   Metabolic acidosis, normal anion gap (NAG)   Constipation   Acute kidney injury complicating chronic kidney disease  Nephrology consulting and assisting with management.  Creatinine peaked to 5.6 on 12/15.  Baseline creatinine 1.21 in 11/2016.  -16.5 L since admission.  UO: 2.45 L yesterday.  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  I am unable to find final pathology results in [facility] yet.  S/p IV  Solu-Medrol x1 dose on 12/15 and now transitioned to oral prednisone, reduced to 50 mg daily  Creatinine has steadily improved over the last 4 days, down to 4.7 today.  Nephrology has discontinued IV fluids, encouraged oral fluid intake, follow BMP in a.m. and if creatinine continues to improve/remained stable, possible discharge home 12/21.  Urinary retention and bilateral hydronephrosis  Renal ultrasound 12/6: Bilateral hydronephrosis, right greater than left.  No shadowing stone.  Increased renal parenchyma echogenicity in keeping with chronic kidney disease.  CT abdomen pelvis without contrast 12/7: Bilateral mild hydroureteral nephrosis to the level of urinary bladder.  Patient had some urinary irritation post Foley removal which may have resolved.  No clinical UTI.  Monitor.  Repeat renal ultrasound 12/16: Moderate to severe bilateral hydronephrosis with associated cortical thickening.  Post void at renal ultrasound showed urinary retention of 1085 mL.  Foley catheter placed back on 12/17 for acute urinary retention.  Urology consultation appreciated.  Increased Flomax to 0.4 mg twice daily and will follow up in office in a week's time.  Right renal mass  Noted on CT abdomen and pelvis without contrast 12/7.  Radiology reports most consistent with a cyst based on renal ultrasound.  They recommend considering surveillance with renal ultrasound at 6 months and attention to right renal lesion.  As per repeat renal ultrasound 12/16: 3 cm cystic focus in the upper pole right kidney corresponding well for hyperdense structure on the comparison CT favors benign hyperdense/proteinaceous cyst though recommend considering 83-month follow-up ultrasound to assess stability.  Nonblanching purpuric rash  Noted  on bilateral ankle, feet and in the neck area.  Most concerning for vasculitic process in the setting of acute kidney injury.  Serology results as noted above.  S/p IR guided biopsy on  12/11.  Currently on steroids p.o. and topical.  Much improved.  Non-anion gap metabolic acidosis  Secondary to acute kidney injury.  Resolved.  Bicarbonate supplements discontinued.  Normocytic anemia  Possibly due to chronic disease and chronic kidney disease.  Iron panel with normal iron and decreased TIBC.  ESA per nephrology.  Hemoglobin stable in the low 8 g range for the last 2 days.  Hyponatremia  Possibly due to acute kidney injury.    Stable.  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  CBGs have worsened.  DM coordinator input appreciated.  Changed SSI to moderate sensitivity with bedtime scale and added mealtime NovoLog 4 units and monitor closely.  Better controlled.  CBGs this morning ranging between 196-149  Essential hypertension  Reasonably controlled on amlodipine 2.5 mg daily.  Reported epistaxis  Resolved  Tobacco/alcohol dependence  Cessation counseled.  Reported binge drinking on weekends PTA.  No withdrawal.    DVT prophylaxis: SCDs Code Status: Full Family Communication: Discussed in detail with patient spouse on 12/18 via speaker phone at bedside.  Updated care and answered questions. Disposition: To home pending clinical improvement and nephrology clearance, possibly 12/21   Consultants:   Nephrology IR Urology  Procedures:   Ultrasound-guided left renal biopsy by IR on 12/17. Foley catheter placed 12/17.  Antimicrobials:   None   Subjective:  Denies complaints  Objective:   Vitals:   03/26/19 1632 03/26/19 2212 03/27/19 0450 03/27/19 0914  BP: 131/89 127/70 136/70 136/73  Pulse: 72 87 82 84  Resp: 18 18 18 18   Temp: 98.4 F (36.9 C) (!) 97.4 F (36.3 C) 97.9 F (36.6 C) 97.9 F (36.6 C)  TempSrc: Oral  Oral Oral  SpO2: 97% 97% 99% 99%  Weight:      Height:        General exam: Pleasant middle-age male, moderately built and nourished sitting up comfortably in bed.  Oral mucosa  moist. Respiratory system: Clear to auscultation.  No increased work of breathing. Cardiovascular system: S1 and S2 heard, RRR.  No JVD, murmurs or pedal edema. Gastrointestinal system: Abdomen is nondistended, soft and nontender. No organomegaly or masses felt. Normal bowel sounds heard.  Bladder not palpable. GU: Foley catheter in place. Central nervous system: Alert and oriented. No focal neurological deficits. Extremities: Symmetric 5 x 5 power. Skin: Mildly erythematous maculopapular rash noted to bilateral ankle and feet continues to improve. right side of neck but that on cheek seems to have resolved. Psychiatry: Judgement and insight appear normal. Mood & affect appropriate.     Data Reviewed:   I have personally reviewed following labs and imaging studies   CBC: Recent Labs  Lab 03/23/19 0538 03/24/19 0747 03/25/19 0512  WBC 7.4 10.7* 12.3*  HGB 7.5* 8.2* 8.4*  HCT 22.1* 23.8* 24.9*  MCV 97.4 96.0 98.4  PLT 291 368 283    Basic Metabolic Panel: Recent Labs  Lab 03/24/19 0747 03/25/19 0512 03/26/19 0621 03/27/19 0615  NA 130* 131* 134* 131*  K 4.2 4.3 4.7 3.9  CL 96* 96* 97* 100  CO2 23 23 26 23   GLUCOSE 135* 120* 127* 278*  BUN 79* 80* 82* 80*  CREATININE 5.70* 5.23* 5.06* 4.75*  CALCIUM 8.8* 8.6* 8.5* 7.8*  PHOS 3.5 3.1  --  2.9    Liver Function Tests: Recent Labs  Lab 03/24/19 0747 03/25/19 0512 03/27/19 0615  ALBUMIN 2.8* 2.8* 2.3*    CBG: Recent Labs  Lab 03/26/19 2213 03/27/19 0712 03/27/19 1122  GLUCAP 340* 196* 149*    Microbiology Studies:   No results found for this or any previous visit (from the past 240 hour(s)).   Radiology Studies:  No results found.   Scheduled Meds:   . amLODipine  2.5 mg Oral Daily  . Chlorhexidine Gluconate Cloth  6 each Topical Daily  . darbepoetin (ARANESP) injection - NON-DIALYSIS  100 mcg Subcutaneous Q Thu-1800  . hydrocortisone   Topical BID  . hydrocortisone   Topical BID  . insulin  aspart  0-15 Units Subcutaneous TID WC  . insulin aspart  0-5 Units Subcutaneous QHS  . insulin aspart  4 Units Subcutaneous TID WC  . predniSONE  50 mg Oral Q breakfast  . tamsulosin  0.4 mg Oral BID  . thiamine  100 mg Oral Daily   Or  . thiamine  100 mg Intravenous Daily    Continuous Infusions:      LOS: 14 days     Vernell Leep, MD, [place], [facility]. [facility]    To contact the attending provider between 7A-7P or the covering provider during after hours 7P-7A, please log into the web site [email or web address] and access using universal [place] password for that web site. If you do not have the password, please call the hospital operator.  03/27/2019, 2:34 PM

## 2019-03-27 NOTE — Progress Notes (Signed)
PROGRESS NOTE   Louis Butler  TDV:[id number]    DOB: [birth year]    DOA: 03/13/2019  PCP: Patient, [name]   I have briefly reviewed patients previous medical records in [facility].  Chief Complaint:   Chief Complaint  Patient presents with  . Foot Pain    Brief Narrative:  63 year old married male, with PMH of DM 2, HTN, alcohol dependence (12-18 beers daily) not followed up with PCP in 2 years and not on prescription medications, presented to [facility] via [place] on 12/6 with complaints of bilateral lower extremity rash with pain of 3 to 4 weeks duration. He was admitted for acute renal failure, acute urinary retention, suspected acute cystitis, uncontrolled hypertension and lower extremity rash.  Nephrology was consulted.  After extensive evaluation including left kidney biopsy, acute kidney injury felt to be due to vasculitic process and acute urinary retention.  Treated with steroids and Foley catheter with gradual improvement in creatinine.   Assessment & Plan:  Principal Problem:   Acute renal failure (ARF) (HCC) Active Problems:   Diabetes mellitus (HCC)   Hypertension   Non compliance w medication regimen   Tobacco use   Rash of both feet   Hyponatremia   Epistaxis   Purpura (HCC)   Metabolic acidosis, normal anion gap (NAG)   Constipation   Acute kidney injury  Baseline creatinine 1.2 in 11/2017. Presented with creatinine of 5.24.    Urine microscopy showed dipstick hematuria with microscopic RBCs as well as proteinuria.  Renal ultrasound 12/6: Bilateral hydronephrosis, right>left.  CT abdomen pelvis without contrast 12/7: Bilateral mild hydroureteronephrosis, followed to the level of the urinary bladder.  Concern for bladder outlet obstruction.  Foley catheter placed for urinary retention  Nephrology was consulted and assisted with management.  SPEP: Polyclonal increase in gamma globulin.  No no apparent evidence of monoclonal protein.   Increased Kappa and lambda free light chains.  ANA, GBM Ab, cryoglobulin, myeloperoxidase antibodies, ANCA proteinase 3, ENA: Negative.  C4 normal.  C3 low/78.  Since serologies for ANCA vasculitis were negative and negative for plasma cell dyscrasias, nephrology ordered renal biopsy by IR.  Underwent ultrasound-guided left kidney biopsy on 12/11  Through the course of his hospital admission he remained nonoliguric with good urine output.    Although creatinine had initially improved after brief IV fluid hydration and Foley catheterization, it again continue to worsen.  Preliminary left kidney biopsy results showed acute tubular interstitial nephritis in addition to early/mild diabetic kidney disease.  He was then started on IV Solu-Medrol 125 mg x 1 dose on 12/15 followed by p.o. prednisone 50 mg daily.  After initiation of prednisone and treating for acute urinary retention with repeat Foley, creatinine has steadily improved from a peak of 5.75 on 12/16 gradually down to 4.75 on 12/20.  Brief IV fluids started for postobstructive diuresis now discontinued.  As per nephrology, if creatinine continues to improve, possible discharge home 12/21  Non-anion gap metabolic acidosis  Secondary to acute kidney injury.  Resolved.  Bicarbonate supplements discontinued.  Hyponatremia  Likely secondary to kidney injury.  Stable.  Acute urinary retention and bilateral hydronephrosis  Foley catheter placed in ED and ~1.5 L urine drained right away.  Renal ultrasound 12/6: Bilateral hydronephrosis, right greater than left.  No shadowing stone.  Increased renal parenchyma echogenicity in keeping with chronic kidney disease.  CT abdomen pelvis without contrast 12/7: Bilateral mild hydroureteral nephrosis to the level of urinary bladder.  Patient  had some urinary irritation post Foley removal which may have resolved.  No clinical UTI.  Monitor.  Repeat renal ultrasound 12/16: Moderate to severe  bilateral hydronephrosis with associated cortical thickening.  Post void at renal ultrasound showed urinary retention of 1085 mL.  Foley catheter placed on admission 12/6 was initially removed on 12/12.  Foley catheter reinserted 12/17.  Urology consultation appreciated.  Increased Flomax to 0.4 mg twice daily and will follow up in office in a week's time.  Right renal mass  Noted on CT abdomen and pelvis without contrast 12/7.  Radiology reports most consistent with a cyst based on renal ultrasound.  They recommend considering surveillance with renal ultrasound at 6 months and attention to right renal lesion.  As per repeat renal ultrasound 12/16: 3 cm cystic focus in the upper pole right kidney corresponding well for hyperdense structure on the comparison CT favors benign hyperdense/proteinaceous cyst though recommend considering 84-monthfollow-up ultrasound to assess stability.  Bilateral lower extremity and facial vasculitic rash.   Suspicion for vasculitic process given rash, epistaxis and acute renal failure.  Purpuric, maculopapular and nonblanching  Bilateral lower extremity venous Dopplers negative for DVT.  Arterial Doppler: Bilateral resting ABI within normal range.  No evidence of significant lower extremity arterial disease.  ESR (76) and CRP (4.6) elevated.  HIV negative.  Hepatitis panel: Negative.  Much improved after oral and topical steroids.  Normocytic anemia  Anemia panel: Iron 63, TIBC 165, saturation ratio 38, ferritin 283 declined FOBT.  Possibly due to chronic disease and chronic kidney disease.  Received ESA per nephrology.  Hemoglobin stable in the 8 g range.  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  Not on medications recently, previously on Metformin.  A1c 6.2 indicating good control on diet alone.  Worsened hyperglycemia due to steroids.  DM coordinator input appreciated.  Currently on NovoLog SSI/moderate scale and mealtime  NovoLog 4 units  CBGs better controlled this morning 196-149.  Essential hypertension  Not on medications PTA.  Presented to ED with SBP of 207.  Controlled on amlodipine 2.5 mg daily.  Epistaxis  Reported bleeding from right nostril for a week or so prior to admission.  Alcohol dependence  Reported drinking 12-18 beers daily.  No alcohol withdrawal.  Tobacco abuse  Nicotine patch  Constipation    DVT prophylaxis: SCDs Code Status: Full Family Communication: Discussed in detail with patient spouse on 12/18 via speaker phone at bedside.  Updated care and answered questions. Disposition: To home pending clinical improvement and nephrology clearance, possibly 12/21   Consultants:   Nephrology IR Urology  Procedures:   Ultrasound-guided left kidney biopsy 12/11. Foley catheter placed on admission 12/6 was initially removed on 12/12.  Foley catheter reinserted 12/17.   Antimicrobials:   None   Subjective:  Denies complaints.  Objective:   Vitals:   03/26/19 2212 03/27/19 0450 03/27/19 0914 03/27/19 1626  BP: 127/70 136/70 136/73 129/73  Pulse: 87 82 84 82  Resp: 18 18 18 18   Temp: (!) 97.4 F (36.3 C) 97.9 F (36.6 C) 97.9 F (36.6 C) 98.9 F (37.2 C)  TempSrc:  Oral Oral Oral  SpO2: 97% 99% 99% 99%  Weight:      Height:        General exam: Pleasant middle-age male, moderately built and nourished sitting up comfortably in bed.  Oral mucosa moist. Respiratory system: Clear to auscultation.  No increased work of breathing. Cardiovascular system: S1 and S2 heard, RRR.  No JVD,  murmurs or pedal edema. Gastrointestinal system: Abdomen is nondistended, soft and nontender. No organomegaly or masses felt. Normal bowel sounds heard.  Bladder not palpable. GU: Foley catheter in place. Central nervous system: Alert and oriented. No focal neurological deficits. Extremities: Symmetric 5 x 5 power. Skin: Mildly erythematous maculopapular rash noted to  bilateral ankle and feet continues to improve. right side of neck but that on cheek seems to have resolved.  Please see below pictures taken on admission 12/6. Psychiatry: Judgement and insight appear normal. Mood & affect appropriate.                 Data Reviewed:   I have personally reviewed following labs and imaging studies   CBC: Recent Labs  Lab 03/23/19 0538 03/24/19 0747 03/25/19 0512  WBC 7.4 10.7* 12.3*  HGB 7.5* 8.2* 8.4*  HCT 22.1* 23.8* 24.9*  MCV 97.4 96.0 98.4  PLT 291 368 790    Basic Metabolic Panel: Recent Labs  Lab 03/24/19 0747 03/25/19 0512 03/26/19 0621 03/27/19 0615  NA 130* 131* 134* 131*  K 4.2 4.3 4.7 3.9  CL 96* 96* 97* 100  CO2 23 23 26 23   GLUCOSE 135* 120* 127* 278*  BUN 79* 80* 82* 80*  CREATININE 5.70* 5.23* 5.06* 4.75*  CALCIUM 8.8* 8.6* 8.5* 7.8*  PHOS 3.5 3.1  --  2.9    Liver Function Tests: Recent Labs  Lab 03/24/19 0747 03/25/19 0512 03/27/19 0615  ALBUMIN 2.8* 2.8* 2.3*    CBG: Recent Labs  Lab 03/26/19 2213 03/27/19 0712 03/27/19 1122  GLUCAP 340* 196* 149*    Microbiology Studies:  No results found for this or any previous visit (from the past 240 hour(s)).   Radiology Studies:  No results found.   Scheduled Meds:   . amLODipine  2.5 mg Oral Daily  . Chlorhexidine Gluconate Cloth  6 each Topical Daily  . darbepoetin (ARANESP) injection - NON-DIALYSIS  100 mcg Subcutaneous Q Thu-1800  . hydrocortisone   Topical BID  . hydrocortisone   Topical BID  . insulin aspart  0-15 Units Subcutaneous TID WC  . insulin aspart  0-5 Units Subcutaneous QHS  . insulin aspart  4 Units Subcutaneous TID WC  . predniSONE  50 mg Oral Q breakfast  . tamsulosin  0.4 mg Oral BID  . thiamine  100 mg Oral Daily   Or  . thiamine  100 mg Intravenous Daily    Continuous Infusions:     LOS: 14 days     Vernell Leep, MD, [place], [facility]. [facility]    To contact the attending provider between 7A-7P  or the covering provider during after hours 7P-7A, please log into the web site [email or web address] and access using universal [place] password for that web site. If you do not have the password, please call the hospital operator.  03/27/2019, 4:35 PM

## 2019-03-27 NOTE — Progress Notes (Signed)
[facility] NEPHROLOGY PROGRESS NOTE  Assessment/ Plan: Pt is a 63 y.o. yo male with history of HTN, DM, admitted with bilateral lower extremity rash, AKI with a creatinine level of 5.04.  #Acute kidney injury versus progressive CKD: Nonoliguric.  UA with RBC and serology test consistent with negative ANA, ANCA, dsDNA Ab, MPO, C4, kappa lambda ratio.  C3 level is mildly reduced.    The preliminary result of kidney biopsy showed acute tubular interstitial nephritis in addition to early/mild diabetic kidney disease.  Also has obstructive uropathy Received IV Solu-Medrol on 12/15.  Lowered prednisone dose to 50 mg 12/19.  Plan for gradual tapering. -Improving creatinine level, good urine output and nonuremic.  DC IV fluid.  Encourage oral intake.  May discharge home tomorrow if renal parameters continue to improve.  # Acute urinary retention and bilateral hydronephrosis seen on imaging studies: Seen by urologist and has Foley catheter for urinary retention.  Now robust urine output.  #Hyponatremia, euvolemic, appears due to reduced free water excretion in the setting of AKI and from chronic alcohol use, beer potomania. Improving, Monitor lab.  #Metabolic acidosis: improved, dc sodium bicarbonate.  #Anemia of CKD: Iron saturation 38%.  Continue Aranesp.  #Hypertension: On amlodipine.  BP acceptable.  Discussed with primary team.  Subjective: Seen and examined at bedside.  Urine output of 2450 cc.  Feeling good.  Denies nausea vomiting chest pain shortness of breath, dysgeusia.  Objective Vital signs in last 24 hours: Vitals:   03/26/19 1632 03/26/19 2212 03/27/19 0450 03/27/19 0914  BP: 131/89 127/70 136/70 136/73  Pulse: 72 87 82 84  Resp: 18 18 18 18   Temp: 98.4 F (36.9 C) (!) 97.4 F (36.3 C) 97.9 F (36.6 C) 97.9 F (36.6 C)  TempSrc: Oral  Oral Oral  SpO2: 97% 97% 99% 99%  Weight:      Height:       Weight change:   Intake/Output Summary (Last 24 hours) at  03/27/2019 1138 Last data filed at 03/27/2019 0915 Gross per 24 hour  Intake 1480.69 ml  Output 2675 ml  Net -1194.31 ml       Labs: Basic Metabolic Panel: Recent Labs  Lab 03/24/19 0747 03/25/19 0512 03/26/19 0621 03/27/19 0615  NA 130* 131* 134* 131*  K 4.2 4.3 4.7 3.9  CL 96* 96* 97* 100  CO2 23 23 26 23   GLUCOSE 135* 120* 127* 278*  BUN 79* 80* 82* 80*  CREATININE 5.70* 5.23* 5.06* 4.75*  CALCIUM 8.8* 8.6* 8.5* 7.8*  PHOS 3.5 3.1  --  2.9   Liver Function Tests: Recent Labs  Lab 03/24/19 0747 03/25/19 0512 03/27/19 0615  ALBUMIN 2.8* 2.8* 2.3*   No results for input(s): LIPASE, AMYLASE in the last 168 hours. No results for input(s): AMMONIA in the last 168 hours. CBC: Recent Labs  Lab 03/21/19 0550 03/22/19 0540 03/23/19 0538 03/24/19 0747 03/25/19 0512  WBC 6.2 6.0 7.4 10.7* 12.3*  HGB 7.4* 7.6* 7.5* 8.2* 8.4*  HCT 22.2* 22.8* 22.1* 23.8* 24.9*  MCV 99.6 99.6 97.4 96.0 98.4  PLT 233 278 291 368 397   Cardiac Enzymes: No results for input(s): CKTOTAL, CKMB, CKMBINDEX, TROPONINI in the last 168 hours. CBG: Recent Labs  Lab 03/26/19 1129 03/26/19 1631 03/26/19 2213 03/27/19 0712 03/27/19 1122  GLUCAP 143* 255* 340* 196* 149*    Iron Studies: No results for input(s): IRON, TIBC, TRANSFERRIN, FERRITIN in the last 72 hours. Studies/Results: No results found.  Medications: Infusions: . sodium chloride  100 mL/hr at 03/26/19 2310    Scheduled Medications: . amLODipine  2.5 mg Oral Daily  . Chlorhexidine Gluconate Cloth  6 each Topical Daily  . darbepoetin (ARANESP) injection - NON-DIALYSIS  100 mcg Subcutaneous Q Thu-1800  . hydrocortisone   Topical BID  . hydrocortisone   Topical BID  . insulin aspart  0-15 Units Subcutaneous TID WC  . insulin aspart  0-5 Units Subcutaneous QHS  . insulin aspart  4 Units Subcutaneous TID WC  . predniSONE  50 mg Oral Q breakfast  . tamsulosin  0.4 mg Oral BID  . thiamine  100 mg Oral Daily   Or  .  thiamine  100 mg Intravenous Daily    have reviewed scheduled and prn medications.  Physical Exam: General: Lying on bed, NAD Heart:RRR, s1s2 nl, no rubs Lungs: Clear b/l, no crackle Abdomen:soft, Non-tender, non-distended Extremities:No LE edema, lower extremity rash around ankle area. Neurology: Alert, awake and following commands  Cotton Beckley [name] 03/27/2019,11:38 AM  LOS: 14 days  Pager: [phone]

## 2019-03-28 LAB — GLUCOSE, CAPILLARY
Glucose-Capillary: 158 mg/dL — ABNORMAL HIGH (ref 70–99)
Glucose-Capillary: 66 mg/dL — ABNORMAL LOW (ref 70–99)
Glucose-Capillary: 303 mg/dL — ABNORMAL HIGH (ref 70–99)

## 2019-03-28 LAB — RENAL FUNCTION PANEL
Albumin: 2.4 g/dL — ABNORMAL LOW (ref 3.5–5.0)
Anion gap: 11 (ref 5–15)
BUN: 76 mg/dL — ABNORMAL HIGH (ref 8–23)
CO2: 22 mmol/L (ref 22–32)
Calcium: 7.8 mg/dL — ABNORMAL LOW (ref 8.9–10.3)
Chloride: 103 mmol/L (ref 98–111)
Creatinine, Ser: 4.7 mg/dL — ABNORMAL HIGH (ref 0.61–1.24)
GFR calc Af Amer: 14 mL/min — ABNORMAL LOW (ref >60)
GFR calc non Af Amer: 12 mL/min — ABNORMAL LOW (ref >60)
Glucose, Bld: 218 mg/dL — ABNORMAL HIGH (ref 70–99)
Phosphorus: 1.8 mg/dL — ABNORMAL LOW (ref 2.5–4.6)
Potassium: 4.3 mmol/L (ref 3.5–5.1)
Sodium: 136 mmol/L (ref 135–145)

## 2019-03-28 LAB — SURGICAL PATHOLOGY

## 2019-03-28 MED ORDER — BLOOD GLUCOSE MONITOR KIT: PACK | 0 refills | Status: AC

## 2019-03-28 MED ORDER — PREDNISONE 10 MG PO TABS: ORAL_TABLET | ORAL | 0 refills | Status: DC

## 2019-03-28 MED ORDER — TAMSULOSIN HCL 0.4 MG PO CAPS: 0.4000 mg | ORAL_CAPSULE | Freq: Two times a day (BID) | ORAL | 0 refills | Status: DC

## 2019-03-28 MED ORDER — INSULIN ASPART 100 UNIT/ML ~~LOC~~ SOLN: 0.0000 [IU] | Freq: Three times a day (TID) | SUBCUTANEOUS | 0 refills | Status: DC

## 2019-03-28 MED ORDER — THIAMINE HCL 100 MG PO TABS: 100.0000 mg | ORAL_TABLET | Freq: Every day | ORAL | 0 refills | Status: DC

## 2019-03-28 MED ORDER — INSULIN SYRINGES (DISPOSABLE) U-100 0.5 ML MISC: 1 refills | Status: AC

## 2019-03-28 MED ORDER — AMLODIPINE BESYLATE 2.5 MG PO TABS: 2.5000 mg | ORAL_TABLET | Freq: Every day | ORAL | 0 refills | Status: DC

## 2019-03-28 MED ORDER — INSULIN ASPART 100 UNIT/ML ~~LOC~~ SOLN: 4.0000 [IU] | Freq: Three times a day (TID) | SUBCUTANEOUS | 0 refills | Status: DC

## 2019-03-28 NOTE — TOC Benefit Eligibility Note (Signed)
Transition of Care [facility]) Benefit Eligibility Note    Patient Details  Name: Louis Butler MRN: [id number] Date of Birth: 1955-11-11   Medication/Dose: Cira Servant  PEN  AND  VIAL/SPYRINGES  Covered?: Yes     Prescription Coverage Preferred Pharmacy: [facility]   AND  [facility]  Spoke with Person/Company/Phone Number:: GABI   @ [facility] # [phone] OPT- MEMBER  Co-Pay: $35.00  Prior Approval: No  Deductible: (NO DEDUCTIBLE WITH PLAN)       Memory Argue Phone Number: 03/28/2019, 1:07 PM

## 2019-03-28 NOTE — Discharge Summary (Signed)
Physician Discharge Summary  Louis Butler YBO:[id number] DOB: Oct 18, 1955  PCP: [facility]  Admitted from: Home Discharged to: Home  Admit date: 03/13/2019 Discharge date: 03/28/2019  Recommendations for Outpatient Follow-up:   Follow-up Information    [facility]. Call in 1 week(s).   Why: Please call the number on the back of your insurance card to find a list of in-network doctors. Or call doctors in your area and ask if they are accepting new patients with [facility].  To be seen with repeat labs (CBC & renal panel).       [facility]. Schedule an appointment as soon as possible for a visit in 1 week(s).   Specialty: Internal Medicine Why: To be seen with repeat labs (CBC & BMP). Contact information: [address] [place] [address] [phone]        Elmarie Shiley, MD Follow up on 04/22/2019.   Specialty: Nephrology Why: 12 pm.  Contact information: [place] [address] [phone]        Cleon Gustin, MD. Schedule an appointment as soon as possible for a visit in 1 week(s).   Specialty: Urology Why: Follow-up regarding urinary catheter management. Contact information: [place] [address] [phone]            Home Health: None Equipment/Devices: None  Discharge Condition: Improved and stable CODE STATUS: Full Diet recommendation: Heart healthy & diabetic diet.  Discharge Diagnoses:  Principal Problem:   Acute renal failure (ARF) (HCC) Active Problems:   Diabetes mellitus (HCC)   Hypertension   Non compliance w medication regimen   Tobacco use   Rash of both feet   Hyponatremia   Epistaxis   Purpura (HCC)   Metabolic acidosis, normal anion gap (NAG)   Constipation   Brief Summary: 63 year old married male, with PMH of DM 2, HTN, alcohol dependence (12-18 beers daily) not followed up with PCP in 2 years and not on prescription medications,  presented to [facility] via [place] on 12/6 with complaints of bilateral lower extremity rash with pain of 3 to 4 weeks duration. He was admitted for acute renal failure, acute urinary retention, suspected acute cystitis, uncontrolled hypertension and lower extremity rash.  Nephrology was consulted.  After extensive evaluation including left kidney biopsy, acute kidney injury felt to be due to vasculitic process and acute urinary retention.  Treated with steroids and Foley catheter with gradual improvement in creatinine.   Assessment & Plan:   Acute kidney injury  Baseline creatinine 1.2 in 11/2017. Presented with creatinine of 5.24.    Urine microscopy showed dipstick hematuria with microscopic RBCs as well as proteinuria.  Renal ultrasound 12/6: Bilateral hydronephrosis, right>left.  CT abdomen pelvis without contrast 12/7: Bilateral mild hydroureteronephrosis, followed to the level of the urinary bladder.  Concern for bladder outlet obstruction.  Foley catheter placed for urinary retention  Nephrology was consulted and assisted with management.  SPEP: Polyclonal increase in gamma globulin.  No no apparent evidence of monoclonal protein.  Increased Kappa and lambda free light chains.  ANA, GBM Ab, cryoglobulin, myeloperoxidase antibodies, ANCA proteinase 3, ENA: Negative.  C4 normal.  C3 low/78.  Since serologies for ANCA vasculitis were negative and negative for plasma cell dyscrasias, nephrology ordered renal biopsy by IR.  Underwent ultrasound-guided left kidney biopsy on 12/11  Through the course of his hospital admission he remained nonoliguric with good urine output.    Although  creatinine had initially improved after brief IV fluid hydration and Foley catheterization, it again continue to worsen.  Preliminary left kidney biopsy results showed acute tubular interstitial nephritis in addition to early/mild diabetic kidney disease.  He was then started on IV Solu-Medrol 125  mg x 1 dose on 12/15 followed by p.o. prednisone 50 mg daily.  After initiation of prednisone and treating for acute urinary retention with repeat Foley, creatinine has steadily improved from a peak of 5.75 on 12/16 gradually down to 4.75 on 12/20.  Brief IV fluids started for postobstructive diuresis now discontinued.  Nephrology follow-up appreciated.  Cleared for discharge home on prednisone taper as indicated below.  They have arranged outpatient follow-up with nephrology.  Case management assisted patient with PCP follow-up.  Patient has to call them for an appointment to be seen in a week's time with repeat labs.  Non-anion gap metabolic acidosis  Secondary to acute kidney injury.  Resolved. Bicarbonate supplements discontinued.  Hyponatremia  Likely secondary to kidney injury.  Stable.  Acute urinary retention and bilateral hydronephrosis  Foley catheter placed in ED and ~1.5 L urine drained right away.  Renal ultrasound 12/6: Bilateral hydronephrosis, right greater than left. No shadowing stone. Increased renal parenchyma echogenicity in keeping with chronic kidney disease.  CT abdomen pelvis without contrast 12/7: Bilateral mild hydroureteral nephrosis to the level of urinary bladder.  Patient had some urinary irritation post Foley removal which may have resolved. No clinical UTI. Monitor.  Repeat renal ultrasound 12/16: Moderate to severe bilateral hydronephrosis with associated cortical thickening. Post void at renal ultrasound showed urinary retention of 1085 mL.  Foley catheter placed on admission 12/6 was initially removed on 12/12.  Foley catheter reinserted 12/17. Urology consultation appreciated. Increased Flomax to 0.4 mg twice daily and will follow up in office in a week's time.  Right renal mass  Noted on CT abdomen and pelvis without contrast 12/7. Radiology reports most consistent with a cyst based on renal ultrasound. They recommend considering  surveillance with renal ultrasound at 6 months and attention to right renal lesion.  As per repeat renal ultrasound 12/16: 3 cm cystic focus in the upper pole right kidney corresponding well for hyperdense structure on the comparison CT favors benign hyperdense/proteinaceous cyst though recommend considering 25-monthfollow-up ultrasound to assess stability.  Bilateral lower extremity and facial vasculitic rash.   Suspicion for vasculitic process given rash, epistaxis and acute renal failure.  Purpuric, maculopapular and nonblanching  Bilateral lower extremity venous Dopplers negative for DVT.  Arterial Doppler: Bilateral resting ABI within normal range.  No evidence of significant lower extremity arterial disease.  ESR (76) and CRP (4.6) elevated.  HIV negative.  Hepatitis panel: Negative.  Much improved after oral and topical steroids.  Almost resolved.  Normocytic anemia  Anemia panel: Iron 63, TIBC 165, saturation ratio 38, ferritin 283 declined FOBT.  Possibly due to chronic disease and chronic kidney disease.  Received ESA per nephrology.  Hemoglobin stable in the 8 g range.  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  Not on medications recently, previously on Metformin.  A1c 6.2 indicating good control on diet alone.  Worsened hyperglycemia due to steroids.  DM coordinator input appreciated.  Patient to be discharged on mealtime NovoLog 4 units along with SSI.  Obviously as the steroids are weaned off, his insulin requirement will decrease and his insulins will need to be tapered down and eventually may even be discontinued.  Patient thereby is advised to follow closely with  PCP and he verbalizes understanding.  Essential hypertension  Not on medications PTA.  Presented to ED with SBP of 207.  Controlled on amlodipine 2.5 mg daily.  Epistaxis  Reported bleeding from right nostril for a week or so prior to admission.  Alcohol  dependence  Reported drinking 12-18 beers daily.  No alcohol withdrawal.  Tobacco abuse  Nicotine patch  Constipation   Consultants:   Nephrology IR Urology  Procedures:   Ultrasound-guided left kidney biopsy 12/11. Foley catheter placed on admission 12/6 was initially removed on 12/12.  Foley catheter reinserted 12/17.      Discharge Instructions  Discharge Instructions    Call MD for:   Complete by: As directed    Recurrent or worsening skin rash.   Call MD for:  difficulty breathing, headache or visual disturbances   Complete by: As directed    Call MD for:  extreme fatigue   Complete by: As directed    Call MD for:  persistant dizziness or light-headedness   Complete by: As directed    Call MD for:  persistant nausea and vomiting   Complete by: As directed    Call MD for:  redness, tenderness, or signs of infection (pain, swelling, redness, odor or green/yellow discharge around incision site)   Complete by: As directed    Call MD for:  severe uncontrolled pain   Complete by: As directed    Call MD for:  temperature >100.4   Complete by: As directed    Diet - low sodium heart healthy   Complete by: As directed    Diet Carb Modified   Complete by: As directed    Increase activity slowly   Complete by: As directed        Medication List    STOP taking these medications   ibuprofen 200 MG tablet Commonly known as: ADVIL   metFORMIN 500 MG tablet Commonly known as: GLUCOPHAGE     TAKE these medications   amLODipine 2.5 MG tablet Commonly known as: NORVASC Take 1 tablet (2.5 mg total) by mouth daily. Start taking on: March 29, 2019 What changed:   medication strength  how much to take   blood glucose meter kit and supplies Kit Dispense based on patient and insurance preference. Use up to four times daily as directed. (FOR ICD-9 250.00, 250.01).   insulin aspart 100 UNIT/ML injection Commonly known as: novoLOG Inject 0-15 Units into  the skin 3 (three) times daily with meals. CBG < 70: Eat or drink something sweet and recheck. CBG 70 - 120: 0 units CBG 121 - 150: 2 units CBG 151 - 200: 3 units CBG 201 - 250: 5 units CBG 251 - 300: 8 units CBG 301 - 350: 11 units CBG 351 - 400: 15 units CBG > 400: call MD.   insulin aspart 100 UNIT/ML injection Commonly known as: novoLOG Inject 4 Units into the skin 3 (three) times daily with meals.   Insulin Syringes (Disposable) U-100 0.5 ML Misc Use as per instructions 3 times daily AC.   predniSONE 10 MG tablet Commonly known as: DELTASONE Take 5 tablets daily for 1 day, then 4 tablets daily for 3 days, then 3 tablets daily for 3 days, then 2 tablets daily for 3 days, then 1 tablet daily for 3 days, then stop. Start taking on: March 29, 2019   tamsulosin 0.4 MG Caps capsule Commonly known as: FLOMAX Take 1 capsule (0.4 mg total) by mouth 2 (two) times daily.  thiamine 100 MG tablet Take 1 tablet (100 mg total) by mouth daily. Start taking on: March 29, 2019      Allergies  Allergen Reactions  . Hydrocodone Hives and Itching  . Percocet [Oxycodone-Acetaminophen] Hives and Itching      Procedures/Studies: CT ABDOMEN PELVIS WO CONTRAST  Result Date: 03/14/2019 CLINICAL DATA:  Renal failure.  Anemia.  Microscopic hematuria. EXAM: CT ABDOMEN AND PELVIS WITHOUT CONTRAST TECHNIQUE: Multidetector CT imaging of the abdomen and pelvis was performed following the standard protocol without IV contrast. COMPARISON:  03/13/2019 renal ultrasound.  No prior CT. FINDINGS: Lower chest: Emphysema. Normal heart size without pericardial or pleural effusion. Multivessel coronary artery atherosclerosis. Hepatobiliary: Normal noncontrast appearance of the liver. Small gallstones without acute cholecystitis or biliary duct dilatation. Pancreas: Normal, without mass or ductal dilatation. Spleen: Normal in size, without focal abnormality. Adrenals/Urinary Tract: Normal adrenal glands.  Mild renal cortical thinning bilaterally. Perirenal interstitial thickening. Exophytic interpolar right renal 3.8 cm lesion measures 48 HU on 31/3. No renal calculi. Mild bilateral hydroureteronephrosis. Hydroureter is followed to the level of the urinary bladder, without obstructive mass. There is a nonobstructive punctate mid right ureteric stone on 59/3 and sagittal image 85. Foley catheter within the urinary bladder. The bladder is thick walled with mild surrounding edema. Stomach/Bowel: Normal stomach, without wall thickening. Normal colon, appendix, and terminal ileum. Normal small bowel. Vascular/Lymphatic: Advanced aortic and branch vessel atherosclerosis. Mildly prominent abdominal retroperitoneal nodes are not pathologic by size criteria and likely reactive. Prominent bilateral inguinal nodes are also identified and favored to be reactive. Reproductive: Normal prostate. Other: No significant free fluid. Musculoskeletal: Lumbosacral spondylosis. Minimal wedging of the T11 superior endplate. IMPRESSION: 1. Bilateral mild hydroureteronephrosis, followed to the level of the urinary bladder. The bladder is thick walled with surrounding pericystic edema in the setting of a Foley catheter. Considerations include cystitis and/or bladder outlet obstruction. Perirenal interstitial thickening is nonspecific but likely related to the clinical history of renal insufficiency. 2. 3 mm mid right ureteric nonobstructive stone. 3. Right renal mass is technically indeterminate on noncontrast exam. Most consistent with a cyst on yesterday's ultrasound. Consider surveillance with renal ultrasound at 6 months and attention to the right renal lesion. 4. Coronary artery atherosclerosis. Aortic Atherosclerosis (ICD10-I70.0). Emphysema (ICD10-J43.9). 5. Cholelithiasis. Electronically Signed   By: Abigail Miyamoto M.D.   On: 03/14/2019 19:44   US Renal  Result Date: 03/23/2019 CLINICAL DATA:  Hydronephrosis EXAM: RENAL / URINARY  TRACT ULTRASOUND COMPLETE COMPARISON:  Ultrasound 03/13/2019, CT abdomen pelvis 03/14/2019 FINDINGS: Right Kidney: Renal measurements: 11.5 x 5.3 x 4.6 cm = volume: 147.5 mL. Moderate to severe hydronephrosis with cortical thinning. In the upper pole is a large exophytic 3 x 2.6 x 2.9 cm cystic focus which likely corresponds to a hyperdense focus on comparison CT likely reflecting hyperdense/proteinaceous cyst. No other worrisome renal lesions. No echogenic calculus. Left Kidney: Renal measurements: 13.5 x 6.0 x 5.0 = volume: 2210.4 mL. Moderate to severe left hydronephrosis. Associated cortical thinning. No concerning left renal mass. No echogenic calculus. Bladder: Patient reportedly voided prior to the examination with a bladder volume at the time of exam of approximately 1085 cc. Other: None. IMPRESSION: Moderate to severe bilateral hydronephrosis with associated cortical thinning. No visible echogenic urolithiasis. Patient reportedly voided prior to the sonography however bladder volume at the time of exam measures 1085 mL, correlate for symptoms of urinary retention and consider assessment of postvoid residual. 3 cm cystic focus in the upper pole right kidney corresponding well  to a hyperdense structure on the comparison CT. Favors benign hyperdense/proteinaceous cyst though could consider six-month follow-up ultrasound to assess stability per CT recommendation. Electronically Signed   By: Lovena Le M.D.   On: 03/23/2019 20:22   US Renal  Result Date: 03/13/2019 CLINICAL DATA:  63 year old male with acute renal failure. History of diabetes. EXAM: RENAL / URINARY TRACT ULTRASOUND COMPLETE COMPARISON:  None. FINDINGS: Evaluation is limited as the patient was not able to cooperate with exam and due to portable technique. Right Kidney: Renal measurements: 12.6 x 4.4 x 4.1 cm = volume: 117 mL. There is moderate parenchyma atrophy. There is increased renal parenchymal echogenicity. There is a 3.3 x 2.4 x 3.1  cm upper pole cyst. There is moderate right hydronephrosis. No shadowing stone. Left Kidney: Renal measurements: 12.8 x 4.8 x 5.1 cm = volume: 164 ML. There is mild parenchyma atrophy and cortical thinning. There is increased renal parenchymal echogenicity. Mild-to-moderate hydronephrosis. No shadowing stone. Bladder: Not well visualized. Other: None. IMPRESSION: 1. Increased renal parenchyma echogenicity in keeping with chronic kidney disease. 2. Bilateral hydronephrosis, right greater left. No shadowing stone. Electronically Signed   By: Anner Crete M.D.   On: 03/13/2019 20:15   US Venous Img Lower Bilateral  Result Date: 03/13/2019 CLINICAL DATA:  Leg swelling right greater than left, calf pain and bilateral foot pain EXAM: BILATERAL LOWER EXTREMITY VENOUS DOPPLER ULTRASOUND TECHNIQUE: Gray-scale sonography with graded compression, as well as color Doppler and duplex ultrasound were performed to evaluate the lower extremity deep venous systems from the level of the common femoral vein and including the common femoral, femoral, profunda femoral, popliteal and calf veins including the posterior tibial, peroneal and gastrocnemius veins when visible. The superficial great saphenous vein was also interrogated. Spectral Doppler was utilized to evaluate flow at rest and with distal augmentation maneuvers in the common femoral, femoral and popliteal veins. COMPARISON:  None FINDINGS: RIGHT LOWER EXTREMITY Common Femoral Vein: No evidence of thrombus. Normal compressibility, respiratory phasicity and response to augmentation. Saphenofemoral Junction: No evidence of thrombus. Normal compressibility and flow on color Doppler imaging. Profunda Femoral Vein: No evidence of thrombus. Normal compressibility and flow on color Doppler imaging. Femoral Vein: No evidence of thrombus. Normal compressibility, respiratory phasicity and response to augmentation. Popliteal Vein: No evidence of thrombus. Normal compressibility,  respiratory phasicity and response to augmentation. Calf Veins: No evidence of thrombus. Normal compressibility and flow on color Doppler imaging. Superficial Great Saphenous Vein: No evidence of thrombus. Normal compressibility. Venous Reflux:  None. Other Findings:  None. LEFT LOWER EXTREMITY Common Femoral Vein: No evidence of thrombus. Normal compressibility, respiratory phasicity and response to augmentation. Saphenofemoral Junction: No evidence of thrombus. Normal compressibility and flow on color Doppler imaging. Profunda Femoral Vein: No evidence of thrombus. Normal compressibility and flow on color Doppler imaging. Femoral Vein: No evidence of thrombus. Normal compressibility, respiratory phasicity and response to augmentation. Popliteal Vein: No evidence of thrombus. Normal compressibility, respiratory phasicity and response to augmentation. Calf Veins: No evidence of thrombus. Normal compressibility and flow on color Doppler imaging. Superficial Great Saphenous Vein: No evidence of thrombus. Normal compressibility. Venous Reflux:  None. Other Findings: Atheromatous plaque throughout visualized arterial vessels with abundant eccentric soft plaque in the right popliteal artery. IMPRESSION: No evidence of deep venous thrombosis in either lower extremity. Signs of arterial vascular plaque with more pronounced changes incidentally noted in the right popliteal artery. Electronically Signed   By: Zetta Bills M.D.   On: 03/13/2019 19:03  DG Chest Port 1 View  Result Date: 03/13/2019 CLINICAL DATA:  Renal failure.  Hypertension and smoking. EXAM: PORTABLE CHEST 1 VIEW COMPARISON:  None. FINDINGS: The heart size and mediastinal contours are within normal limits. Both lungs are clear. The visualized skeletal structures are unremarkable. IMPRESSION: No active disease. Electronically Signed   By: Constance Holster M.D.   On: 03/13/2019 20:36   VAS [place] ABI WITH/WO TBI  Result Date: 03/15/2019 LOWER  EXTREMITY DOPPLER STUDY  Performing Technologist: Lita Mains RDMS, RVT  Examination Guidelines: A complete evaluation includes at minimum, Doppler waveform signals and systolic blood pressure reading at the level of bilateral brachial, anterior tibial, and posterior tibial arteries, when vessel segments are accessible. Bilateral testing is considered an integral part of a complete examination. Photoelectric Plethysmograph (PPG) waveforms and toe systolic pressure readings are included as required and additional duplex testing as needed. Limited examinations for reoccurring indications may be performed as noted.  ABI Findings: +--------+------------------+-----+---------+--------+ Right   Rt Pressure (mmHg)IndexWaveform Comment  +--------+------------------+-----+---------+--------+ [id number]                    triphasic         +--------+------------------+-----+---------+--------+ PTA     219               1.21 triphasic         +--------+------------------+-----+---------+--------+ DP      222               1.23 triphasic         +--------+------------------+-----+---------+--------+ +--------+------------------+-----+---------+-------+ Left    Lt Pressure (mmHg)IndexWaveform Comment +--------+------------------+-----+---------+-------+ [id number]                    triphasic        +--------+------------------+-----+---------+-------+ PTA     220               1.22 biphasic         +--------+------------------+-----+---------+-------+ DP      217               1.20 triphasic        +--------+------------------+-----+---------+-------+ +-------+-----------+-----------+------------+------------+ ABI/TBIToday's ABIToday's TBIPrevious ABIPrevious TBI +-------+-----------+-----------+------------+------------+ Right  1.23                                           +-------+-----------+-----------+------------+------------+ Left   1.22                                            +-------+-----------+-----------+------------+------------+  Summary: Right: Resting right ankle-brachial index is within normal range. No evidence of significant right lower extremity arterial disease. Left: Resting left ankle-brachial index is within normal range. No evidence of significant left lower extremity arterial disease.  *See table(s) above for measurements and observations.  Electronically signed by Harold Barban MD on 03/15/2019 at 5:23:12 PM.   Final    US BIOPSY (KIDNEY)  Result Date: 03/18/2019 INDICATION: 63 year old male with a history renal dysfunction referred for medical renal biopsy EXAM: IMAGE GUIDED MEDICAL RENAL BIOPSY MEDICATIONS: None. ANESTHESIA/SEDATION: Moderate (conscious) sedation was employed during this procedure. A total of Versed 1.0 mg and Fentanyl 25 mcg was administered intravenously. Moderate Sedation Time: 13 minutes. The patient's level of consciousness and vital signs were monitored continuously  by radiology nursing throughout the procedure under my direct supervision. FLUOROSCOPY TIME:  None COMPLICATIONS: None PROCEDURE: Informed written consent was obtained from the patient after a thorough discussion of the procedural risks, benefits and alternatives. All questions were addressed. Maximal Sterile Barrier Technique was utilized including caps, mask, sterile gowns, sterile gloves, sterile drape, hand hygiene and skin antiseptic. A timeout was performed prior to the initiation of the procedure. Patient was positioned prone position on the gantry table. Images were stored sent to PACs. Once the patient is prepped and draped in the usual sterile fashion, the skin and subcutaneous tissues overlying the left kidney were generously infiltrated 1% lidocaine for local anesthesia. Using ultrasound guidance, a 15 gauge guide needle was advanced into the lower cortex of the left kidney. Once we confirmed location of the needle tip, 2 separate 16 gauge  core biopsy were achieved. Two Gel-Foam pledgets were infused with a small amount of saline. The needle was removed. Final images were stored. The patient tolerated the procedure well and remained hemodynamically stable throughout. No complications were encountered and no significant blood loss encountered. IMPRESSION: Status post ultrasound-guided medical renal biopsy. Signed, Dulcy Fanny. Dellia Nims, RPVI [facility] Electronically Signed   By: Corrie Mckusick D.O.   On: 03/18/2019 09:57      Subjective: Patient denies complaints.  Facial rash has resolved.  Bilateral leg rash significantly improved compared to admission, nonpruritic, right leg is almost resolved.  As per RN, no acute issues noted.  Discharge Exam:  Vitals:   03/27/19 0914 03/27/19 1626 03/27/19 2108 03/28/19 0503  BP: 136/73 129/73 125/66 139/78  Pulse: 84 82 83 78  Resp: _0 Temp: 97.9 F (36.6 C) 98.9 F (37.2 C) 98.3 F (36.8 C) 97.7 F (36.5 C)  TempSrc: Oral Oral Oral Oral  SpO2: 99% 99% 98% 98%  Weight:   96.4 kg   Height:        General exam:Pleasant middle-age male, moderately built and nourishedsitting up comfortably in bed. Oral mucosa moist. Respiratory system: Clear to auscultation. No increased work of breathing. Cardiovascular system:S1 and S2 heard, RRR. No JVD, murmurs or pedal edema. Gastrointestinal system:Abdomen is nondistended, soft and nontender. No organomegaly or masses felt. Normal bowel sounds heard. Bladder not palpable. JO:ACZYS catheter in place. Central nervous system:Alert and oriented. No focal neurological deficits. Extremities: Symmetric 5 x 5 power. Skin: Face/neck rash resolved.  Right ankle/leg rash almost resolved.  Left ankle/leg rash significantly improved and resolving.  Below are pictures of his taken on day of admission. Psychiatry:Judgement and insight appear normal. Mood &affect appropriate.                   The results of significant diagnostics from this hospitalization (including imaging, microbiology, ancillary and laboratory) are listed below for reference.     Microbiology: No results found for this or any previous visit (from the past 240 hour(s)).   Labs: CBC: Recent Labs  Lab 03/22/19 0540 03/23/19 0538 03/24/19 0747 03/25/19 0512  WBC 6.0 7.4 10.7* 12.3*  HGB 7.6* 7.5* 8.2* 8.4*  HCT 22.8* 22.1* 23.8* 24.9*  MCV 99.6 97.4 96.0 98.4  PLT 278 291 368 063    Basic Metabolic Panel: Recent Labs  Lab 03/23/19 0538 03/24/19 0747 03/25/19 0512 03/26/19 0621 03/27/19 0615 03/28/19 0415  NA 126* 130* 131* 134* 131* 136  K 4.9 4.2 4.3 4.7 3.9 4.3  CL 94* 96* 96* 97* 100 103  CO2 19* _0 GLUCOSE 225* 135* 120* 127* 278* 218*  BUN 73* 79* 80* 82* 80* 76*  CREATININE 5.75* 5.70* 5.23* 5.06* 4.75* 4.70*  CALCIUM 8.3* 8.8* 8.6* 8.5* 7.8* 7.8*  PHOS 2.6 3.5 3.1  --  2.9 1.8*    Liver Function Tests: Recent Labs  Lab 03/23/19 0538 03/24/19 0747 03/25/19 0512 03/27/19 0615 03/28/19 0415  ALBUMIN 2.4* 2.8* 2.8* 2.3* 2.4*    CBG: Recent Labs  Lab 03/27/19 1625 03/27/19 2107 03/28/19 0652 03/28/19 1128 03/28/19 1502  GLUCAP 348* 346* 158* 66* 303*     Urinalysis    Component Value Date/Time   COLORURINE YELLOW 03/20/2019 1220   APPEARANCEUR HAZY (A) 03/20/2019 1220   LABSPEC 1.008 03/20/2019 1220   PHURINE 7.0 03/20/2019 1220   GLUCOSEU NEGATIVE 03/20/2019 1220   HGBUR LARGE (A) 03/20/2019 1220   BILIRUBINUR NEGATIVE 03/20/2019 1220   KETONESUR NEGATIVE 03/20/2019 1220   PROTEINUR 100 (A) 03/20/2019 1220   NITRITE NEGATIVE 03/20/2019 1220   LEUKOCYTESUR LARGE (A) 03/20/2019 1220      Time coordinating discharge: 45 minutes  SIGNED:  Vernell Leep, MD, FACP, [facility]. [facility]  To contact the attending provider between 7A-7P or the covering provider during after hours 7P-7A, please  log into the web site [email or web address] and access using universal [place] password for that web site. If you do not have the password, please call the hospital operator.

## 2019-03-28 NOTE — Progress Notes (Addendum)
Inpatient Diabetes Program Recommendations  AACE/ADA: New Consensus Statement on Inpatient Glycemic Control (2015)  Target Ranges:  Prepandial:   less than 140 mg/dL      Peak postprandial:   less than 180 mg/dL (1-2 hours)      Critically ill patients:  140 - 180 mg/dL   Lab Results  Component Value Date   GLUCAP 66 (L) 03/28/2019   HGBA1C 6.2 (H) 03/14/2019    Review of Glycemic Control Results for FINIAN, HELVEY (MRN [id number]) as of 03/28/2019 11:41  Ref. Range 03/27/2019 16:25 03/27/2019 21:07 03/28/2019 06:52  Glucose-Capillary Latest Ref Range: 70 - 99 mg/dL 348 (H) 346 (H) 158 (H)   Diabetes history: Type 2 DM Outpatient Diabetes medications: Metformin 500 mg BID Current orders for Inpatient glycemic control: Novolog 0-15 units TID, Novolog 0-5 units QHS, Novolog 4 units TID  Inpatient Diabetes Program Recommendations:    Reached out to care management for benefits check for Novolog vial/syringe.  Discussed with RN education with patient prior to DC on injections. Care order placed.   Will plan to see for injection teaching.   Addendum@1230 : Spoke with patient regarding discharge plan. Reviewed patient's current A1c of 6.2%. Explained what a A1c is and what it measures. Also reviewed goal A1c with patient, importance of good glucose control @ home, and blood sugar goals. Reviewed patho of DM, need for short term insulin given steroids, hyper vs hypo glycemia, survival skills, vascular changes and commorbidities.  Patient will need a meter at discharge. Blood glucose meter (inlcudes lancets and strips) (#[id number]). Encouraged to check CBGs 2 times per day and educated on when to call MD.  Patient self avoids sugary beverages. Encouraged continued mindfulness of carbohydrate consumption.  RN to demonstrate insulin vial and syringe.  Explained and demonstrated proper technique on how to draw up insulin with vial and syringe. RN to continue working with patient on insulin  injections before discharge and allow patient to administer own insulin injections while inpatient. CM performed benefits check:  Novolog vial #31261 0.5 ml Insulin syringe U-100 #[id number]  Thanks, Bronson Curb, MSN, RNC-OB Diabetes Coordinator [phone] (8a-5p)

## 2019-03-28 NOTE — Discharge Instructions (Signed)

## 2019-03-28 NOTE — Progress Notes (Addendum)
[facility] NEPHROLOGY PROGRESS NOTE  Assessment/ Plan: Pt is a 63 y.o. yo male with history of HTN, DM, admitted with bilateral lower extremity rash, AKI with a creatinine level of 5.04.  #Acute kidney injury versus progressive CKD: Nonoliguric.  UA with RBC and serology test consistent with negative ANA, ANCA, dsDNA Ab, MPO, C4, kappa lambda ratio.  C3 level is mildly reduced.    The preliminary result of kidney biopsy showed acute tubular interstitial nephritis in addition to early/mild diabetic kidney disease.  Follow-up final biopsy result.  Also has obstructive uropathy Received IV Solu-Medrol on 12/15.  Lowered prednisone dose to 50 mg 12/19.  Great urine output and cr level plateau around 4.7 today.  Clinically stable, euvolemic without any sign of uremia. Okay to discharge from renal perspective.  Arranged follow with Dr. Posey Pronto on 04/21/2018 at 12 PM at [place] [facility]. Prednisone taper on discharge: 50 mg tomorrow and then lower by 10 mg in every 3 days and then stop. Monitor blood sugar level while on prednisone.  Recommend to have repeat renal panel done in a week with PCP.  # Acute urinary retention and bilateral hydronephrosis seen on imaging studies: Seen by urologist and has Foley catheter for urinary retention.  Now robust urine output. Need to follow-up with urology for trial of void.  #Hyponatremia, euvolemic, appears due to reduced free water excretion in the setting of AKI and from chronic alcohol use, beer potomania.  Sodium level improved.  #Metabolic acidosis: improved, dc sodium bicarbonate.  #Anemia of CKD: Iron saturation 38%.  Received Aranesp.  #Hypertension: On amlodipine.  BP acceptable.  Discussed with primary team.  Subjective: Seen and examined at bedside.  Urine output 3125 cc.  Denies nausea vomiting chest pain shortness of breath.  No weakness, loss of appetite or dysgeusia.  Objective Vital signs in last 24 hours: Vitals:   03/27/19 0914 03/27/19 1626 03/27/19 2108 03/28/19 0503  BP: 136/73 129/73 125/66 139/78  Pulse: 84 82 83 78  Resp: 18 18 18 18   Temp: 97.9 F (36.6 C) 98.9 F (37.2 C) 98.3 F (36.8 C) 97.7 F (36.5 C)  TempSrc: Oral Oral Oral Oral  SpO2: 99% 99% 98% 98%  Weight:   96.4 kg   Height:       Weight change:   Intake/Output Summary (Last 24 hours) at 03/28/2019 1041 Last data filed at 03/28/2019 0807 Gross per 24 hour  Intake 1490 ml  Output 2700 ml  Net -1210 ml       Labs: Basic Metabolic Panel: Recent Labs  Lab 03/25/19 0512 03/26/19 0621 03/27/19 0615 03/28/19 0415  NA 131* 134* 131* 136  K 4.3 4.7 3.9 4.3  CL 96* 97* 100 103  CO2 23 26 23 22   GLUCOSE 120* 127* 278* 218*  BUN 80* 82* 80* 76*  CREATININE 5.23* 5.06* 4.75* 4.70*  CALCIUM 8.6* 8.5* 7.8* 7.8*  PHOS 3.1  --  2.9 1.8*   Liver Function Tests: Recent Labs  Lab 03/25/19 0512 03/27/19 0615 03/28/19 0415  ALBUMIN 2.8* 2.3* 2.4*   No results for input(s): LIPASE, AMYLASE in the last 168 hours. No results for input(s): AMMONIA in the last 168 hours. CBC: Recent Labs  Lab 03/22/19 0540 03/23/19 0538 03/24/19 0747 03/25/19 0512  WBC 6.0 7.4 10.7* 12.3*  HGB 7.6* 7.5* 8.2* 8.4*  HCT 22.8* 22.1* 23.8* 24.9*  MCV 99.6 97.4 96.0 98.4  PLT 278 291 368 397   Cardiac Enzymes: No results for input(s):  CKTOTAL, CKMB, CKMBINDEX, TROPONINI in the last 168 hours. CBG: Recent Labs  Lab 03/27/19 0712 03/27/19 1122 03/27/19 1625 03/27/19 2107 03/28/19 0652  GLUCAP 196* 149* 348* 346* 158*    Iron Studies: No results for input(s): IRON, TIBC, TRANSFERRIN, FERRITIN in the last 72 hours. Studies/Results: No results found.  Medications: Infusions:   Scheduled Medications: . amLODipine  2.5 mg Oral Daily  . Chlorhexidine Gluconate Cloth  6 each Topical Daily  . darbepoetin (ARANESP) injection - NON-DIALYSIS  100 mcg Subcutaneous Q Thu-1800  . hydrocortisone   Topical BID  . hydrocortisone    Topical BID  . insulin aspart  0-15 Units Subcutaneous TID WC  . insulin aspart  0-5 Units Subcutaneous QHS  . insulin aspart  4 Units Subcutaneous TID WC  . predniSONE  50 mg Oral Q breakfast  . tamsulosin  0.4 mg Oral BID  . thiamine  100 mg Oral Daily   Or  . thiamine  100 mg Intravenous Daily    have reviewed scheduled and prn medications.  Physical Exam: General: Lying on bed, NAD Heart:RRR, s1s2 nl, no rubs Lungs: Clear b/l, no crackle Abdomen:soft, Non-tender, non-distended Extremities:No LE edema, lower extremity rash around ankle area. Neurology: Alert, awake and following commands, no asterixis  Jayona Mccaig Prasad Tenecia Ignasiak 03/28/2019,10:41 AM  LOS: 15 days  Pager: [phone]

## 2019-03-28 NOTE — TOC Transition Note (Signed)
Transition of Care [facility]) - CM/SW Discharge Note   Patient Details  Name: Louis Butler MRN: [id number] Date of Birth: November 18, 1955  Transition of Care [facility] CM/SW Contact:  Bartholomew Crews, RN Phone Number: [phone] 03/28/2019, 11:38 AM   Clinical Narrative:    Spoke with patient at the bedside. PTA home with spouse. Discussed need for PCP and calling customer service. Patient stated that he had been going to [facility], but had not been in awhile. Patient called his wife to find out which location - [address], [place]. Patient advised to call to make a hospital follow up appointment and to let the office know that he was just released from the hospital. Contact information [facility] placed on patient 'To do' list on AVS. Patient verbalized understanding and agreed to follow through.   Received message diabetes coordinator concerning request for benefits check. Benefits check requested asap for novolog and syringes.   Verified pharmacy with patient as [facility] in [place].   Patient states that his wife will pick him up at discharge when he calls her.   TOC team following for transition needs.    Final next level of care: Home/Self Care Barriers to Discharge: No Barriers Identified   Patient Goals and CMS Choice   CMS Medicare.gov Compare Post Acute Care list provided to:: Patient Choice offered to / list presented to : NA  Discharge Placement                       Discharge Plan and Services     Post Acute Care Choice: NA          DME Arranged: N/A DME Agency: NA       HH Arranged: NA HH Agency: NA        Social Determinants of Health (SDOH) Interventions     Readmission Risk Interventions No flowsheet data found.

## 2019-03-28 NOTE — Progress Notes (Signed)
Louis Butler to be discharged Home per MD order. Discussed prescriptions and follow up appointments with the patient. Prescriptions given to patient; medication list explained in detail. Patient verbalized understanding.  Skin clean, dry and intact without evidence of skin break down, no evidence of skin tears noted. IV catheter discontinued intact. Site without signs and symptoms of complications. Dressing and pressure applied. Pt denies pain at the site currently. No complaints noted.  Patient free of lines, but going home with Foley Cath.     An After Visit Summary (AVS) was printed and given to the patient. Patient escorted via wheelchair, and discharged home via private auto.  Shela Commons, RN

## 2019-03-29 ENCOUNTER — Telehealth: Payer: Self-pay | Admitting: Internal Medicine

## 2019-03-29 NOTE — Telephone Encounter (Signed)
I am happy to see him but don't have openings until January 7. I would recommend he keeps his scheduled hospital follow up appointment with [facility], and then if he would like to see me to establish care in January he can.  Leeanne Rio, MD

## 2019-03-29 NOTE — Telephone Encounter (Signed)
Was told by Kennyth Lose that this gentleman's wife is a patient of yours and would like to see if he can become a patient of Dr. Lennie Odor too.  Ozella Almond, [place]

## 2019-03-29 NOTE — Telephone Encounter (Signed)
Louis Butler would like to know if you could also take her husband as a patient. He was just released from the hospital and does not have a PCP. Jw

## 2019-04-06 NOTE — Progress Notes (Deleted)
Patient ID: Louis Butler, male   DOB: 1955/07/01, 63 y.o.   MRN: [id number] After hospitalization 12/6-12/21/2020.  From A/P: Discharge Diagnoses:  Principal Problem:   Acute renal failure (ARF) ([place]) Active Problems:   Diabetes mellitus (HCC)   Hypertension   Non compliance w medication regimen   Tobacco use   Rash of both feet   Hyponatremia   Epistaxis   Purpura (HCC)   Metabolic acidosis, normal anion gap (NAG)   Constipation   Brief Summary: 63 year old married male, with PMH of DM 2, HTN, alcohol dependence (12-18 beers daily) not followed up with PCP in 2 years and not on prescription medications, presented to [facility] on 12/6 with complaints of bilateral lower extremity rash with pain of 3 to 4 weeks duration. He was admitted for acute renal failure, acute urinary retention, suspected acute cystitis, uncontrolled hypertension and lower extremity rash. Nephrology was consulted. After extensive evaluation including left kidney biopsy, acute kidney injury felt to be due to vasculitic process and acute urinary retention. Treated with steroids and Foley catheter with gradual improvement in creatinine.   Assessment & Plan:   Acute kidney injury  Baseline creatinine 1.2 in 11/2017. Presented with creatinine of 5.24.   Urine microscopy showed dipstick hematuria with microscopic RBCs as well as proteinuria.  Renal ultrasound 12/6: Bilateral hydronephrosis, right>left.  CT abdomen pelvis without contrast 12/7: Bilateral mild hydroureteronephrosis, followed to the level of the urinary bladder. Concern for bladder outlet obstruction.  Foley catheter placed for urinary retention  Nephrology was consulted and assisted with management.  SPEP: Polyclonal increase in gamma globulin. No no apparent evidence of monoclonal protein. Increased Kappa and lambda free light chains. ANA, GBM Ab, cryoglobulin, myeloperoxidase antibodies, ANCA proteinase 3, ENA:  Negative. C4 normal. C3 low/78.  Since serologies for ANCA vasculitis were negative and negative for plasma cell dyscrasias, nephrology ordered renal biopsy by IR. Underwent ultrasound-guided left kidney biopsy on 12/11  Through the course of his hospital admission he remained nonoliguric with good urine output.   Although creatinine had initially improved after brief IV fluid hydration and Foley catheterization, it again continue to worsen.  Preliminary left kidney biopsy results showed acute tubular interstitial nephritis in addition to early/mild diabetic kidney disease. He was then started on IV Solu-Medrol 125 mg x 1 dose on 12/15 followed by p.o. prednisone 50 mg daily.  After initiation of prednisone and treating for acute urinary retention with repeat Foley, creatinine has steadily improved from a peak of 5.75 on 12/16 gradually down to 4.75 on 12/20. Brief IV fluids started for postobstructive diuresis now discontinued.  Nephrology follow-up appreciated.  Cleared for discharge home on prednisone taper as indicated below.  They have arranged outpatient follow-up with nephrology.  Case management assisted patient with PCP follow-up.  Patient has to call them for an appointment to be seen in a week's time with repeat labs.  Non-anion gap metabolic acidosis  Secondary to acute kidney injury.  Resolved. Bicarbonate supplements discontinued.  Hyponatremia  Likely secondary to kidney injury. Stable.  Acute urinary retention and bilateral hydronephrosis  Foley catheter placed in ED and~1.5 L urine drained right away.  Renal ultrasound 12/6: Bilateral hydronephrosis, right greater than left. No shadowing stone. Increased renal parenchyma echogenicity in keeping with chronic kidney disease.  CT abdomen pelvis without contrast 12/7: Bilateral mild hydroureteral nephrosis to the level of urinary bladder.  Patient had some urinary irritation post Foley removal which may  have resolved. No clinical UTI.  Monitor.  Repeat renal ultrasound 12/16: Moderate to severe bilateral hydronephrosis with associated cortical thickening. Post void at renal ultrasound showed urinary retention of 1085 mL.  Foley catheter placed on admission 12/6 was initially removed on 12/12. Foley catheter reinserted 12/17.Urology consultation appreciated. Increased Flomax to 0.4 mg twice daily and will follow up in office in a week's time.  Right renal mass  Noted on CT abdomen and pelvis without contrast 12/7. Radiology reports most consistent with a cyst based on renal ultrasound. They recommend considering surveillance with renal ultrasound at 6 months and attention to right renal lesion.  As per repeat renal ultrasound 12/16: 3 cm cystic focus in the upper pole right kidney corresponding well for hyperdense structure on the comparison CT favors benign hyperdense/proteinaceous cyst though recommend considering 36-monthfollow-up ultrasound to assess stability.  Bilateral lower extremity and facial vasculitic rash.  Suspicion for vasculitic process given rash, epistaxis and acute renal failure.  Purpuric, maculopapular and nonblanching  Bilateral lower extremity venous Dopplers negative for DVT.  Arterial Doppler: Bilateral resting ABI within normal range. No evidence of significant lower extremity arterial disease.  ESR (76) and CRP (4.6) elevated. HIV negative. Hepatitis panel: Negative.  Much improved after oral and topical steroids.  Almost resolved.  Normocytic anemia  Anemia panel: Iron 63, TIBC 165, saturation ratio 38, ferritin 283 declined FOBT.  Possibly due to chronic disease and chronic kidney disease.  Received ESA per nephrology.  Hemoglobin stable in the 8 g range.  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  Not on medications recently, previously on Metformin.  A1c 6.2 indicating good control on diet alone.  Worsened  hyperglycemia due to steroids. DM coordinator input appreciated.  Patient to be discharged on mealtime NovoLog 4 units along with SSI.  Obviously as the steroids are weaned off, his insulin requirement will decrease and his insulins will need to be tapered down and eventually may even be discontinued.  Patient thereby is advised to follow closely with PCP and he verbalizes understanding.  Essential hypertension  Not on medications PTA. Presented to ED with SBP of 207.  Controlled on amlodipine 2.5 mg daily.  Epistaxis  Reported bleeding from right nostril for a week or so prior to admission.  Alcohol dependence  Reported drinking 12-18 beers daily.  No alcohol withdrawal.  Tobacco abuse  Nicotine patch  Constipation

## 2019-04-07 ENCOUNTER — Other Ambulatory Visit: Payer: Self-pay

## 2019-04-07 ENCOUNTER — Ambulatory Visit: Payer: BC Managed Care – PPO | Admitting: Physician Assistant

## 2019-04-07 NOTE — Progress Notes (Deleted)
Patient is having swelling in both legs and feet, hard to walk.  Refill on tramadol and all medications.

## 2019-04-08 ENCOUNTER — Other Ambulatory Visit: Payer: Self-pay

## 2019-04-08 ENCOUNTER — Encounter (HOSPITAL_COMMUNITY): Payer: Self-pay | Admitting: Emergency Medicine

## 2019-04-08 ENCOUNTER — Emergency Department (HOSPITAL_COMMUNITY): Payer: BC Managed Care – PPO

## 2019-04-08 ENCOUNTER — Inpatient Hospital Stay (HOSPITAL_COMMUNITY)
Admission: EM | Admit: 2019-04-08 | Discharge: 2019-04-12 | DRG: 690 | Disposition: A | Discharge status: Home / Self Care | Payer: BC Managed Care – PPO | Attending: Internal Medicine | Admitting: Internal Medicine

## 2019-04-08 DIAGNOSIS — E1122 Type 2 diabetes mellitus with diabetic chronic kidney disease: Secondary | ICD-10-CM | POA: Diagnosis present

## 2019-04-08 DIAGNOSIS — R601 Generalized edema: Secondary | ICD-10-CM | POA: Diagnosis present

## 2019-04-08 DIAGNOSIS — M549 Dorsalgia, unspecified: Secondary | ICD-10-CM | POA: Diagnosis present

## 2019-04-08 DIAGNOSIS — Z79899 Other long term (current) drug therapy: Secondary | ICD-10-CM

## 2019-04-08 DIAGNOSIS — F17210 Nicotine dependence, cigarettes, uncomplicated: Secondary | ICD-10-CM | POA: Diagnosis present

## 2019-04-08 DIAGNOSIS — D649 Anemia, unspecified: Secondary | ICD-10-CM | POA: Diagnosis not present

## 2019-04-08 DIAGNOSIS — R7989 Other specified abnormal findings of blood chemistry: Secondary | ICD-10-CM

## 2019-04-08 DIAGNOSIS — R197 Diarrhea, unspecified: Secondary | ICD-10-CM | POA: Diagnosis present

## 2019-04-08 DIAGNOSIS — Z833 Family history of diabetes mellitus: Secondary | ICD-10-CM | POA: Diagnosis not present

## 2019-04-08 DIAGNOSIS — N401 Enlarged prostate with lower urinary tract symptoms: Secondary | ICD-10-CM | POA: Diagnosis present

## 2019-04-08 DIAGNOSIS — N136 Pyonephrosis: Principal | ICD-10-CM | POA: Diagnosis present

## 2019-04-08 DIAGNOSIS — Z801 Family history of malignant neoplasm of trachea, bronchus and lung: Secondary | ICD-10-CM

## 2019-04-08 DIAGNOSIS — I493 Ventricular premature depolarization: Secondary | ICD-10-CM | POA: Diagnosis present

## 2019-04-08 DIAGNOSIS — R600 Localized edema: Secondary | ICD-10-CM | POA: Diagnosis present

## 2019-04-08 DIAGNOSIS — N10 Acute tubulo-interstitial nephritis: Secondary | ICD-10-CM | POA: Diagnosis not present

## 2019-04-08 DIAGNOSIS — Z8249 Family history of ischemic heart disease and other diseases of the circulatory system: Secondary | ICD-10-CM | POA: Diagnosis not present

## 2019-04-08 DIAGNOSIS — E875 Hyperkalemia: Secondary | ICD-10-CM | POA: Diagnosis present

## 2019-04-08 DIAGNOSIS — Z794 Long term (current) use of insulin: Secondary | ICD-10-CM | POA: Diagnosis not present

## 2019-04-08 DIAGNOSIS — N139 Obstructive and reflux uropathy, unspecified: Secondary | ICD-10-CM | POA: Diagnosis not present

## 2019-04-08 DIAGNOSIS — R338 Other retention of urine: Secondary | ICD-10-CM | POA: Diagnosis present

## 2019-04-08 DIAGNOSIS — G8929 Other chronic pain: Secondary | ICD-10-CM | POA: Diagnosis present

## 2019-04-08 DIAGNOSIS — N179 Acute kidney failure, unspecified: Secondary | ICD-10-CM | POA: Diagnosis present

## 2019-04-08 DIAGNOSIS — N183 Chronic kidney disease, stage 3 unspecified: Secondary | ICD-10-CM | POA: Diagnosis present

## 2019-04-08 DIAGNOSIS — N138 Other obstructive and reflux uropathy: Secondary | ICD-10-CM | POA: Diagnosis present

## 2019-04-08 DIAGNOSIS — Z20822 Contact with and (suspected) exposure to covid-19: Secondary | ICD-10-CM | POA: Diagnosis present

## 2019-04-08 DIAGNOSIS — I10 Essential (primary) hypertension: Secondary | ICD-10-CM | POA: Diagnosis present

## 2019-04-08 DIAGNOSIS — E1129 Type 2 diabetes mellitus with other diabetic kidney complication: Secondary | ICD-10-CM

## 2019-04-08 DIAGNOSIS — B965 Pseudomonas (aeruginosa) (mallei) (pseudomallei) as the cause of diseases classified elsewhere: Secondary | ICD-10-CM | POA: Diagnosis present

## 2019-04-08 DIAGNOSIS — E1140 Type 2 diabetes mellitus with diabetic neuropathy, unspecified: Secondary | ICD-10-CM | POA: Diagnosis present

## 2019-04-08 DIAGNOSIS — R609 Edema, unspecified: Secondary | ICD-10-CM

## 2019-04-08 DIAGNOSIS — Z720 Tobacco use: Secondary | ICD-10-CM | POA: Diagnosis not present

## 2019-04-08 DIAGNOSIS — E119 Type 2 diabetes mellitus without complications: Secondary | ICD-10-CM

## 2019-04-08 DIAGNOSIS — Z960 Presence of urogenital implants: Secondary | ICD-10-CM | POA: Diagnosis present

## 2019-04-08 DIAGNOSIS — Z885 Allergy status to narcotic agent status: Secondary | ICD-10-CM

## 2019-04-08 LAB — URINALYSIS, ROUTINE W REFLEX MICROSCOPIC
Bilirubin Urine: NEGATIVE
Glucose, UA: NEGATIVE mg/dL
Ketones, ur: NEGATIVE mg/dL
Nitrite: NEGATIVE
Protein, ur: 100 mg/dL — AB
Specific Gravity, Urine: 1.004 — ABNORMAL LOW (ref 1.005–1.030)
WBC, UA: >50 WBC/hpf — ABNORMAL HIGH (ref 0–5)
pH: 7 (ref 5.0–8.0)

## 2019-04-08 LAB — TSH: TSH: 2.839 u[IU]/mL (ref 0.350–4.500)

## 2019-04-08 LAB — COMPREHENSIVE METABOLIC PANEL
ALT: 17 U/L (ref 0–44)
AST: 16 U/L (ref 15–41)
Albumin: 3 g/dL — ABNORMAL LOW (ref 3.5–5.0)
Alkaline Phosphatase: 57 U/L (ref 38–126)
Anion gap: 10 (ref 5–15)
BUN: 62 mg/dL — ABNORMAL HIGH (ref 8–23)
CO2: 21 mmol/L — ABNORMAL LOW (ref 22–32)
Calcium: 7.5 mg/dL — ABNORMAL LOW (ref 8.9–10.3)
Chloride: 103 mmol/L (ref 98–111)
Creatinine, Ser: 3.94 mg/dL — ABNORMAL HIGH (ref 0.61–1.24)
GFR calc Af Amer: 18 mL/min — ABNORMAL LOW (ref >60)
GFR calc non Af Amer: 15 mL/min — ABNORMAL LOW (ref >60)
Glucose, Bld: 104 mg/dL — ABNORMAL HIGH (ref 70–99)
Potassium: 5.4 mmol/L — ABNORMAL HIGH (ref 3.5–5.1)
Sodium: 134 mmol/L — ABNORMAL LOW (ref 135–145)
Total Bilirubin: 0.5 mg/dL (ref 0.3–1.2)
Total Protein: 6.1 g/dL — ABNORMAL LOW (ref 6.5–8.1)

## 2019-04-08 LAB — CBC WITH DIFFERENTIAL/PLATELET
Abs Immature Granulocytes: 0.07 10*3/uL (ref 0.00–0.07)
Basophils Absolute: 0 10*3/uL (ref 0.0–0.1)
Basophils Relative: 0 %
Eosinophils Absolute: 0 10*3/uL (ref 0.0–0.5)
Eosinophils Relative: 0 %
HCT: 32.3 % — ABNORMAL LOW (ref 39.0–52.0)
Hemoglobin: 10.2 g/dL — ABNORMAL LOW (ref 13.0–17.0)
Immature Granulocytes: 1 %
Lymphocytes Relative: 8 %
Lymphs Abs: 0.9 10*3/uL (ref 0.7–4.0)
MCH: 32.6 pg (ref 26.0–34.0)
MCHC: 31.6 g/dL (ref 30.0–36.0)
MCV: 103.2 fL — ABNORMAL HIGH (ref 80.0–100.0)
Monocytes Absolute: 0.7 10*3/uL (ref 0.1–1.0)
Monocytes Relative: 6 %
Neutro Abs: 10.1 10*3/uL — ABNORMAL HIGH (ref 1.7–7.7)
Neutrophils Relative %: 85 %
Platelets: 224 10*3/uL (ref 150–400)
RBC: 3.13 MIL/uL — ABNORMAL LOW (ref 4.22–5.81)
RDW: 14.7 % (ref 11.5–15.5)
WBC: 11.7 10*3/uL — ABNORMAL HIGH (ref 4.0–10.5)
nRBC: 0 % (ref 0.0–0.2)

## 2019-04-08 LAB — SARS CORONAVIRUS 2 (TAT 6-24 HRS): SARS Coronavirus 2: NEGATIVE

## 2019-04-08 LAB — CBG MONITORING, ED
Glucose-Capillary: 82 mg/dL (ref 70–99)
Glucose-Capillary: 122 mg/dL — ABNORMAL HIGH (ref 70–99)

## 2019-04-08 LAB — BRAIN NATRIURETIC PEPTIDE: B Natriuretic Peptide: 118.8 pg/mL — ABNORMAL HIGH (ref 0.0–100.0)

## 2019-04-08 MED ORDER — FUROSEMIDE 10 MG/ML IJ SOLN
40.0000 mg | Freq: Every day | INTRAMUSCULAR | Status: DC
  Administered 2019-04-08 – 2019-04-09 (×2): 40 mg via INTRAVENOUS
  Filled 2019-04-08 (×2): qty 4

## 2019-04-08 MED ORDER — CARVEDILOL 3.125 MG PO TABS
3.1250 mg | ORAL_TABLET | Freq: Two times a day (BID) | ORAL | Status: DC
  Administered 2019-04-08 – 2019-04-12 (×8): 3.125 mg via ORAL
  Filled 2019-04-08 (×9): qty 1

## 2019-04-08 MED ORDER — PREDNISONE 10 MG PO TABS
10.0000 mg | ORAL_TABLET | Freq: Every day | ORAL | Status: DC
  Filled 2019-04-08: qty 1

## 2019-04-08 MED ORDER — INSULIN ASPART 100 UNIT/ML ~~LOC~~ SOLN
0.0000 [IU] | Freq: Three times a day (TID) | SUBCUTANEOUS | Status: DC
  Administered 2019-04-09: 17:00:00 3 [IU] via SUBCUTANEOUS
  Administered 2019-04-09: 2 [IU] via SUBCUTANEOUS
  Administered 2019-04-10: 3 [IU] via SUBCUTANEOUS
  Administered 2019-04-10 – 2019-04-11 (×2): 2 [IU] via SUBCUTANEOUS
  Administered 2019-04-11: 3 [IU] via SUBCUTANEOUS
  Administered 2019-04-12: 2 [IU] via SUBCUTANEOUS
  Administered 2019-04-12: 5 [IU] via SUBCUTANEOUS

## 2019-04-08 MED ORDER — TAMSULOSIN HCL 0.4 MG PO CAPS
0.4000 mg | ORAL_CAPSULE | Freq: Two times a day (BID) | ORAL | Status: DC
  Administered 2019-04-08 – 2019-04-12 (×8): 0.4 mg via ORAL
  Filled 2019-04-08 (×8): qty 1

## 2019-04-08 MED ORDER — HYDRALAZINE HCL 25 MG PO TABS
25.0000 mg | ORAL_TABLET | Freq: Four times a day (QID) | ORAL | Status: DC | PRN
  Administered 2019-04-12: 25 mg via ORAL
  Filled 2019-04-08: qty 1

## 2019-04-08 MED ORDER — GABAPENTIN 300 MG PO CAPS
300.0000 mg | ORAL_CAPSULE | Freq: Every day | ORAL | Status: DC
  Administered 2019-04-08 – 2019-04-11 (×4): 300 mg via ORAL
  Filled 2019-04-08 (×4): qty 1

## 2019-04-08 MED ORDER — HEPARIN SODIUM (PORCINE) 5000 UNIT/ML IJ SOLN
5000.0000 [IU] | Freq: Three times a day (TID) | INTRAMUSCULAR | Status: DC
  Administered 2019-04-08 – 2019-04-12 (×12): 5000 [IU] via SUBCUTANEOUS
  Filled 2019-04-08 (×12): qty 1

## 2019-04-08 MED ORDER — INSULIN ASPART 100 UNIT/ML ~~LOC~~ SOLN: 0.0000 [IU] | SUBCUTANEOUS | Status: DC

## 2019-04-08 MED ORDER — ISOSORBIDE MONONITRATE ER 30 MG PO TB24
30.0000 mg | ORAL_TABLET | Freq: Every day | ORAL | Status: DC
  Administered 2019-04-08 – 2019-04-12 (×5): 30 mg via ORAL
  Filled 2019-04-08 (×5): qty 1

## 2019-04-08 MED ORDER — HYDRALAZINE HCL 25 MG PO TABS: 25.0000 mg | ORAL_TABLET | Freq: Three times a day (TID) | ORAL | Status: DC

## 2019-04-08 MED ORDER — INSULIN ASPART 100 UNIT/ML ~~LOC~~ SOLN: 4.0000 [IU] | Freq: Three times a day (TID) | SUBCUTANEOUS | Status: DC

## 2019-04-08 MED ORDER — ACETAMINOPHEN 500 MG PO TABS
500.0000 mg | ORAL_TABLET | Freq: Four times a day (QID) | ORAL | Status: DC | PRN
  Administered 2019-04-08: 1000 mg via ORAL
  Filled 2019-04-08: qty 2

## 2019-04-08 NOTE — ED Notes (Signed)
Attempted call again for report; will call again at 30 minute status time.

## 2019-04-08 NOTE — ED Notes (Signed)
Switched Urine Collection Leg Bag to [employer].

## 2019-04-08 NOTE — ED Provider Notes (Signed)
[facility] EMERGENCY DEPARTMENT Provider Note   CSN: [id number] Arrival date & time: 04/08/19  1531     History Chief Complaint  Patient presents with  . Leg Swelling    Louis Butler is a 64 y.o. male presenting for evaluation of peripheral edema.  Patient states since leaving the hospital about a week ago he has had severe worsening bilateral peripheral edema.  Patient states symptoms improve slightly with elevation.  He has swollen to the point that he is no longer able to wear his compression socks.  He has been taking his discharge medications including his Flomax as prescribed.  He is not on any Lasix or fluid pills.  He denies chest pain, shortness of breath, nausea, vomiting, abdominal swelling, upper extremity swelling, or facial edema.  He denies history of heart failure.  Additional history obtained from chart review.  Patient was admitted last month from 12/6-12/21 for acute renal failure, urinary retention.   HPI     Past Medical History:  Diagnosis Date  . ARF (acute renal failure) ([place]) 03/14/2019  . Cellulitis 03/14/2019   feet  . Diabetes mellitus without complication ([place])   . Hypertension   . Irregular heart beat   . UTI (urinary tract infection)     Patient Active Problem List   Diagnosis Date Noted  . Anasarca 04/08/2019  . Constipation 03/19/2019  . Purpura ([place]) 03/16/2019  . Metabolic acidosis, normal anion gap (NAG) 03/16/2019  . AKI (acute kidney injury) ([place]) 03/14/2019  . Non compliance w medication regimen 03/14/2019  . Tobacco use 03/14/2019  . Rash of both feet 03/14/2019  . Hyponatremia 03/14/2019  . Epistaxis 03/14/2019  . Acute renal failure (ARF) ([place]) 03/13/2019  . Wound, open, foot 08/22/2015  . Cellulitis 08/22/2015  . Diabetes mellitus ([place]) 08/22/2015  . Hypertension 08/22/2015    Past Surgical History:  Procedure Laterality Date  . WISDOM TOOTH EXTRACTION         Family History  Problem Relation  Age of Onset  . Diabetes type II Mother   . Lung cancer Mother   . Hypertension Mother   . Diabetes type II Father   . Diabetes type II Sister   . Diabetes type II Brother   . Hypertension Brother   . CAD Neg Hx     Social History   Tobacco Use  . Smoking status: Current Every Day Smoker    Types: Cigarettes  . Smokeless tobacco: Never Used  Substance Use Topics  . Alcohol use: Yes    Comment: 12 pack a week, couple beers a day  . Drug use: No    Home Medications Prior to Admission medications   Medication Sig Start Date End Date Taking? Authorizing Provider  acetaminophen (TYLENOL) 500 MG tablet Take 500-1,000 mg by mouth every 6 (six) hours as needed for headache (pain).   Yes [provider]  amLODipine (NORVASC) 2.5 MG tablet Take 1 tablet (2.5 mg total) by mouth daily. 03/29/19  Yes Hongalgi, Lenis Dickinson, MD  insulin aspart (NOVOLOG) 100 UNIT/ML injection Inject 0-15 Units into the skin 3 (three) times daily with meals. CBG < 70: Eat or drink something sweet and recheck. CBG 70 - 120: 0 units CBG 121 - 150: 2 units CBG 151 - 200: 3 units CBG 201 - 250: 5 units CBG 251 - 300: 8 units CBG 301 - 350: 11 units CBG 351 - 400: 15 units CBG > 400: call MD. 03/28/19  Yes Hongalgi,  Lenis Dickinson, MD  predniSONE (DELTASONE) 10 MG tablet Take 5 tablets daily for 1 day, then 4 tablets daily for 3 days, then 3 tablets daily for 3 days, then 2 tablets daily for 3 days, then 1 tablet daily for 3 days, then stop. Patient taking differently: Take 10-50 mg by mouth See admin instructions. Take 5 tablets (50 mg) daily for 1 day, then 4 tablets (40 mg) daily for 3 days, then 3 tablets (30 mg) daily for 3 days, then 2 tablets (20 mg) daily for 3 days, then 1 tablet (10 mg) daily for 3 days, then stop. 03/29/19  Yes Hongalgi, Lenis Dickinson, MD  tamsulosin (FLOMAX) 0.4 MG CAPS capsule Take 1 capsule (0.4 mg total) by mouth 2 (two) times daily. 03/28/19  Yes Hongalgi, Lenis Dickinson, MD  thiamine 100 MG  tablet Take 1 tablet (100 mg total) by mouth daily. 03/29/19  Yes Hongalgi, Lenis Dickinson, MD  blood glucose meter kit and supplies KIT Dispense based on patient and insurance preference. Use up to four times daily as directed. (FOR ICD-9 250.00, 250.01). 03/28/19   Hongalgi, Lenis Dickinson, MD  insulin aspart (NOVOLOG) 100 UNIT/ML injection Inject 4 Units into the skin 3 (three) times daily with meals. Patient not taking: Reported on 04/08/2019 03/28/19   Modena Jansky, MD  Insulin Syringes, Disposable, U-100 0.5 ML MISC Use as per instructions 3 times daily AC. 03/28/19   Hongalgi, Lenis Dickinson, MD    Allergies    Hydrocodone and Percocet [oxycodone-acetaminophen]  Review of Systems   Review of Systems  Cardiovascular: Positive for leg swelling.  All other systems reviewed and are negative.   Physical Exam Updated Vital Signs BP (!) 155/101   Pulse 70   Temp 98.3 F (36.8 C) (Oral)   Resp (!) 24   SpO2 100%   Physical Exam Vitals and nursing note reviewed.  Constitutional:      General: He is not in acute distress.    Appearance: He is well-developed.     Comments: Sitting comfortably in the bed in no acute distress  HENT:     Head: Normocephalic and atraumatic.  Eyes:     Extraocular Movements: Extraocular movements intact.     Conjunctiva/sclera: Conjunctivae normal.     Pupils: Pupils are equal, round, and reactive to light.  Cardiovascular:     Rate and Rhythm: Normal rate and regular rhythm.     Pulses: Normal pulses.  Pulmonary:     Effort: Pulmonary effort is normal. No respiratory distress.     Breath sounds: Normal breath sounds. No wheezing.     Comments: Speaking in full sentences.  Clear lung sounds in all fields. Abdominal:     General: There is no distension.     Palpations: Abdomen is soft. There is no mass.     Tenderness: There is no abdominal tenderness. There is no guarding or rebound.  Musculoskeletal:        General: Normal range of motion.     Cervical back:  Normal range of motion and neck supple.     Right lower leg: Edema present.     Left lower leg: Edema present.     Comments: 3+ pitting edema bilaterally.  Skin:    General: Skin is warm and dry.     Capillary Refill: Capillary refill takes less than 2 seconds.  Neurological:     Mental Status: He is alert and oriented to person, place, and time.     ED  Results / Procedures / Treatments   Labs (all labs ordered are listed, but only abnormal results are displayed) Labs Reviewed  COMPREHENSIVE METABOLIC PANEL - Abnormal; Notable for the following components:      Result Value   Sodium 134 (*)    Potassium 5.4 (*)    CO2 21 (*)    Glucose, Bld 104 (*)    BUN 62 (*)    Creatinine, Ser 3.94 (*)    Calcium 7.5 (*)    Total Protein 6.1 (*)    Albumin 3.0 (*)    GFR calc non Af Amer 15 (*)    GFR calc Af Amer 18 (*)    All other components within normal limits  CBC WITH DIFFERENTIAL/PLATELET - Abnormal; Notable for the following components:   WBC 11.7 (*)    RBC 3.13 (*)    Hemoglobin 10.2 (*)    HCT 32.3 (*)    MCV 103.2 (*)    Neutro Abs 10.1 (*)    All other components within normal limits  BRAIN NATRIURETIC PEPTIDE - Abnormal; Notable for the following components:   B Natriuretic Peptide 118.8 (*)    All other components within normal limits  URINALYSIS, ROUTINE W REFLEX MICROSCOPIC - Abnormal; Notable for the following components:   Color, Urine STRAW (*)    Specific Gravity, Urine 1.004 (*)    Hgb urine dipstick MODERATE (*)    Protein, ur 100 (*)    Leukocytes,Ua LARGE (*)    WBC, UA >50 (*)    Bacteria, UA RARE (*)    All other components within normal limits  C DIFFICILE QUICK SCREEN W PCR REFLEX  URINE CULTURE  SARS CORONAVIRUS 2 (TAT 6-24 HRS)  TSH  BASIC METABOLIC PANEL  CBG MONITORING, ED    EKG EKG Interpretation  Date/Time:  Friday April 08 2019 16:12:01 EST Ventricular Rate:  85 PR Interval:    QRS Duration: 91 QT Interval:  367 QTC  Calculation: 437 R Axis:   -26 Text Interpretation: Sinus rhythm Ventricular trigeminy Borderline left axis deviation Anterior infarct, old intermittnet trigeminy. No STEMI Confirmed by Antony Blackbird [phone]) on 04/08/2019 5:37:18 PM   Radiology DG Chest 2 View  Result Date: 04/08/2019 CLINICAL DATA:  Pt c/o leg edema x 1 day. Hx of DM AND HTN. Pt is a current smoker. EXAM: CHEST - 2 VIEW COMPARISON:  Chest radiograph 03/13/2019 FINDINGS: Stable cardiomediastinal contours with normal heart size. The lungs are clear. No pneumothorax or pleural effusion. No acute finding in the visualized skeleton. IMPRESSION: No active cardiopulmonary disease. Electronically Signed   By: Audie Pinto M.D.   On: 04/08/2019 16:40    Procedures Procedures (including critical care time)  Medications Ordered in ED Medications  acetaminophen (TYLENOL) tablet 500-1,000 mg (has no administration in time range)  predniSONE (DELTASONE) tablet 10 mg (has no administration in time range)  tamsulosin (FLOMAX) capsule 0.4 mg (has no administration in time range)  heparin injection 5,000 Units (has no administration in time range)  isosorbide mononitrate (IMDUR) 24 hr tablet 30 mg (has no administration in time range)  furosemide (LASIX) injection 40 mg (has no administration in time range)  insulin aspart (novoLOG) injection 0-9 Units (has no administration in time range)  gabapentin (NEURONTIN) tablet 300 mg (has no administration in time range)  carvedilol (COREG) tablet 3.125 mg (has no administration in time range)  hydrALAZINE (APRESOLINE) tablet 25 mg (has no administration in time range)    ED Course  I  have reviewed the triage vital signs and the nursing notes.  Pertinent labs & imaging results that were available during my care of the patient were reviewed by me and considered in my medical decision making (see chart for details).    MDM Rules/Calculators/A&P                      Patient presenting  for evaluation of peripheral edema.  Physical exam shows 3+ pitting edema bilaterally.  Otherwise, no sign of fluid overload.  He is not short of breath, no crackles.  Will obtain x-ray, labs, and EKG.  X-ray viewed interpreted by me, no pneumonia, proximal effusion.  Labs show improving creatinine at 3.9.  However patient's potassium is elevated at 5.4 and calcium is lower at 7.5, demonstrating worsening electrolyte abnormalities.  EKG shows trigeminy.  I am concerned that patient would benefit from diuresis, however in the setting of elevated creatinine, concern for possible kidney injury.  As such, he would likely benefit from slow diuresis with close monitoring of his kidneys.  Will call for admission.  Discussed with Dr. Roosevelt Locks from [facility], patient to be admitted.  Final Clinical Impression(s) / ED Diagnoses Final diagnoses:  Peripheral edema  Elevated serum creatinine  Hyperkalemia  Hypocalcemia    Rx / DC Orders ED Discharge Orders    None       Franchot Heidelberg, PA-C 04/08/19 2002    Tegeler, Gwenyth Allegra, MD 04/08/19 2220

## 2019-04-08 NOTE — H&P (Addendum)
History and Physical    CHRISTAIN NIZNIK STM:[id number] DOB: 1956-03-12 DOA: 04/08/2019  PCP: [facility]   Patient coming from: Home  I have personally briefly reviewed patient's old medical records in [place]  Chief Complaint: Leg and feet swelling  HPI: SHOOTER TANGEN is a 64 y.o. male with medical history significant of diabetes mellitus, hypertension,?  arrhythmia, presented with worsening of bilateral legs and feet swelling.  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  During last admission, he also developed urinary retention secondary to severe BPH and was sent home on Foley. Patient noted  increasing swelling of his legs and feet, and no longer fit into any of his shoes anymore. But he denied any fever chills, no new back pain  (chronic back pain due to a bulging disc).  ED Course: Patient was found to have anasarca and borderline hyperkalemia.  Review of Systems: As per HPI otherwise 10 point review of systems negative.    Past Medical History:  Diagnosis Date  . ARF (acute renal failure) ([place]) 03/14/2019  . Cellulitis 03/14/2019   feet  . Diabetes mellitus without complication ([place])   . Hypertension   . Irregular heart beat   . UTI (urinary tract infection)     Past Surgical History:  Procedure Laterality Date  . WISDOM TOOTH EXTRACTION       reports that he has been smoking cigarettes. He has never used smokeless tobacco. He reports current alcohol use. He reports that he does not use drugs.  Allergies  Allergen Reactions  . Hydrocodone Hives and Itching  . Percocet [Oxycodone-Acetaminophen] Hives and Itching    Family History  Problem Relation Age of Onset  . Diabetes type II Mother   . Lung cancer Mother   . Hypertension Mother   . Diabetes type II Father   . Diabetes type II Sister   . Diabetes type II Brother   .  Hypertension Brother   . CAD Neg Hx    Prior to Admission medications   Medication Sig Start Date End Date Taking? Authorizing Provider  acetaminophen (TYLENOL) 500 MG tablet Take 500-1,000 mg by mouth every 6 (six) hours as needed for headache (pain).   Yes [provider]  amLODipine (NORVASC) 2.5 MG tablet Take 1 tablet (2.5 mg total) by mouth daily. 03/29/19  Yes Hongalgi, Lenis Dickinson, MD  insulin aspart (NOVOLOG) 100 UNIT/ML injection Inject 0-15 Units into the skin 3 (three) times daily with meals. CBG < 70: Eat or drink something sweet and recheck. CBG 70 - 120: 0 units CBG 121 - 150: 2 units CBG 151 - 200: 3 units CBG 201 - 250: 5 units CBG 251 - 300: 8 units CBG 301 - 350: 11 units CBG 351 - 400: 15 units CBG > 400: call MD. 03/28/19  Yes Hongalgi, Lenis Dickinson, MD  predniSONE (DELTASONE) 10 MG tablet Take 5 tablets daily for 1 day, then 4 tablets daily for 3 days, then 3 tablets daily for 3 days, then 2 tablets daily for 3 days, then 1 tablet daily for 3 days, then stop. Patient taking differently: Take 10-50 mg by mouth See admin instructions. Take 5 tablets (50 mg) daily for 1 day, then 4 tablets (40 mg) daily for 3 days, then 3 tablets (30 mg) daily for 3 days, then 2 tablets (20 mg) daily for 3 days, then 1 tablet (10 mg) daily  for 3 days, then stop. 03/29/19  Yes Hongalgi, Lenis Dickinson, MD  tamsulosin (FLOMAX) 0.4 MG CAPS capsule Take 1 capsule (0.4 mg total) by mouth 2 (two) times daily. 03/28/19  Yes Hongalgi, Lenis Dickinson, MD  thiamine 100 MG tablet Take 1 tablet (100 mg total) by mouth daily. 03/29/19  Yes Hongalgi, Lenis Dickinson, MD  blood glucose meter kit and supplies KIT Dispense based on patient and insurance preference. Use up to four times daily as directed. (FOR ICD-9 250.00, 250.01). 03/28/19   Hongalgi, Lenis Dickinson, MD  insulin aspart (NOVOLOG) 100 UNIT/ML injection Inject 4 Units into the skin 3 (three) times daily with meals. Patient not taking: Reported on 04/08/2019 03/28/19    Modena Jansky, MD  Insulin Syringes, Disposable, U-100 0.5 ML MISC Use as per instructions 3 times daily AC. 03/28/19   Modena Jansky, MD    Physical Exam: Vitals:   04/08/19 1538 04/08/19 1730 04/08/19 1800 04/08/19 1815  BP: (!) 192/96 (!) 160/98 (!) 153/77 (!) 152/84  Pulse: 100 (!) 37 72 70  Resp: _0 Temp: 98.3 F (36.8 C)     TempSrc: Oral     SpO2: 100% 100% 99% 100%    Constitutional: NAD, calm, comfortable Vitals:   04/08/19 1538 04/08/19 1730 04/08/19 1800 04/08/19 1815  BP: (!) 192/96 (!) 160/98 (!) 153/77 (!) 152/84  Pulse: 100 (!) 37 72 70  Resp: _1 Temp: 98.3 F (36.8 C)     TempSrc: Oral     SpO2: 100% 100% 99% 100%   Eyes: PERRL, lids and conjunctivae normal ENMT: Mucous membranes are moist. Posterior pharynx clear of any exudate or lesions.Normal dentition.  Neck: normal, supple, no masses, no thyromegaly Respiratory: clear to auscultation bilaterally, no wheezing, no crackles. Normal respiratory effort. No accessory muscle use.  Cardiovascular: Regular rate and rhythm, no murmurs / rubs / gallops.  Anasarca. 2+ pedal pulses. No carotid bruits.  Abdomen: no tenderness, no masses palpated. No hepatosplenomegaly. Bowel sounds positive.  Musculoskeletal: no clubbing / cyanosis. No joint deformity upper and lower extremities. Good ROM, no contractures. Normal muscle tone.  Skin: no rashes, lesions, ulcers. No induration Neurologic: CN 2-12 grossly intact. Sensation intact, DTR normal. Strength 5/5 in all 4.  Psychiatric: Normal judgment and insight. Alert and oriented x 3. Normal mood.     Labs on Admission: I have personally reviewed following labs and imaging studies  CBC: Recent Labs  Lab 04/08/19 1621  WBC 11.7*  NEUTROABS 10.1*  HGB 10.2*  HCT 32.3*  MCV 103.2*  PLT 680   Basic Metabolic Panel: Recent Labs  Lab 04/08/19 1621  NA 134*  K 5.4*  CL 103  CO2 21*  GLUCOSE 104*  BUN 62*  CREATININE 3.94*  CALCIUM  7.5*   GFR: Estimated Creatinine Clearance: 22.3 mL/min (A) (by C-G formula based on SCr of 3.94 mg/dL (H)). Liver Function Tests: Recent Labs  Lab 04/08/19 1621  AST 16  ALT 17  ALKPHOS 57  BILITOT 0.5  PROT 6.1*  ALBUMIN 3.0*   No results for input(s): LIPASE, AMYLASE in the last 168 hours. No results for input(s): AMMONIA in the last 168 hours. Coagulation Profile: No results for input(s): INR, PROTIME in the last 168 hours. Cardiac Enzymes: No results for input(s): CKTOTAL, CKMB, CKMBINDEX, TROPONINI in the last 168 hours. BNP (last 3 results) No results for input(s): PROBNP in the last 8760 hours. HbA1C: No results for input(s): HGBA1C in  the last 72 hours. CBG: Recent Labs  Lab 04/08/19 1819  GLUCAP 82   Lipid Profile: No results for input(s): CHOL, HDL, LDLCALC, TRIG, CHOLHDL, LDLDIRECT in the last 72 hours. Thyroid Function Tests: No results for input(s): TSH, T4TOTAL, FREET4, T3FREE, THYROIDAB in the last 72 hours. Anemia Panel: No results for input(s): VITAMINB12, FOLATE, FERRITIN, TIBC, IRON, RETICCTPCT in the last 72 hours. Urine analysis:    Component Value Date/Time   COLORURINE STRAW (A) 04/08/2019 1717   APPEARANCEUR CLEAR 04/08/2019 1717   LABSPEC 1.004 (L) 04/08/2019 1717   PHURINE 7.0 04/08/2019 1717   GLUCOSEU NEGATIVE 04/08/2019 1717   HGBUR MODERATE (A) 04/08/2019 1717   BILIRUBINUR NEGATIVE 04/08/2019 1717   KETONESUR NEGATIVE 04/08/2019 1717   PROTEINUR 100 (A) 04/08/2019 1717   NITRITE NEGATIVE 04/08/2019 1717   LEUKOCYTESUR LARGE (A) 04/08/2019 1717    Radiological Exams on Admission: DG Chest 2 View  Result Date: 04/08/2019 CLINICAL DATA:  Pt c/o leg edema x 1 day. Hx of DM AND HTN. Pt is a current smoker. EXAM: CHEST - 2 VIEW COMPARISON:  Chest radiograph 03/13/2019 FINDINGS: Stable cardiomediastinal contours with normal heart size. The lungs are clear. No pneumothorax or pleural effusion. No acute finding in the visualized skeleton.  IMPRESSION: No active cardiopulmonary disease. Electronically Signed   By: Audie Pinto M.D.   On: 04/08/2019 16:40    EKG: Independently reviewed.  Frequent PVCs.  Assessment/Plan Active Problems:   Anasarca  Anasarca, like from his baseline kidney problem, discussed with on-call nephrologist Dr. Hollie Salk, agreed to start Lasix 40 mg daily IV.  Discontinue amlodipine.  Patient also claims bilateral feet shooting pain, probably due to anasarca plus minus diabetic neuropathy, will start gabapentin at bedtime.  Hyperkalemia, lasix tonight, recheck in AM.  Tubular interstitial nephritis, on last 3 days of tapering of prednisone.  Uncontrolled hypertension, start Imdur, and Coreg, also add as needed hydralazine p.o. for systolic pressure more than 140.  Frequent PVCs, start Coreg check mag and Phos.  Urinary retention, continue Foley for now, there was a plan for urology outpt visit probably for void trial.  IDDM, sliding scale only given worsening of kidney function.  Hypocalcemia, check phos, may need binder, on VitD.  Cigarette smoke, advised to quit.   DVT prophylaxis: Heparin subcu Code Status: Full code Family Communication: None at bedside Disposition Plan: We will add PT Consults called: Dr. Hollie Salk Admission status: Tele   Lequita Halt MD [facility] Pager [phone]  If 7PM-7AM, please contact night-coverage [email or web address] Password [facility]  04/08/2019, 7:46 PM

## 2019-04-08 NOTE — ED Notes (Signed)
ED TO INPATIENT HANDOFF REPORT  ED Nurse Name and Phone #: Lunette Stands [place] Name/Age/Gender Louis Butler 64 y.o. male Room/Bed: 045C/045C  Code Status   Code Status: Full Code  Home/SNF/Other Home Patient oriented to: self, place, time and situation Is this baseline? Yes   Triage Complete: Triage complete  Chief Complaint Anasarca [R60.1]  Triage Note Patient c/o swollen legs and feet since he was discharged from the hospital a few weeks ago. Reports normal urine output, keeps a chart at home.     Allergies Allergies  Allergen Reactions  . Hydrocodone Hives and Itching  . Percocet [Oxycodone-Acetaminophen] Hives and Itching    Level of Care/Admitting Diagnosis ED Disposition    ED Disposition Condition [facility]: [place] [[id number]]  Level of Care: Telemetry Medical [104]  Covid Evaluation: Asymptomatic Screening Protocol (No Symptoms)  Diagnosis: Anasarca [[id number]]  Admitting Physician: Lequita Halt [[id number]]  Attending Physician: Lequita Halt [[id number]]  Estimated length of stay: 3 - 4 days  Certification:: I certify this patient will need inpatient services for at least 2 midnights       B Medical/Surgery History Past Medical History:  Diagnosis Date  . ARF (acute renal failure) ([place]) 03/14/2019  . Cellulitis 03/14/2019   feet  . Diabetes mellitus without complication ([place])   . Hypertension   . Irregular heart beat   . UTI (urinary tract infection)    Past Surgical History:  Procedure Laterality Date  . WISDOM TOOTH EXTRACTION       A IV Location/Drains/Wounds Patient Lines/Drains/Airways Status   Active Line/Drains/Airways    Name:   Placement date:   Placement time:   Site:   Days:   Peripheral IV 04/08/19 Left Wrist   04/08/19    1837    Wrist   less than 1   Peripheral IV 04/08/19 Right Antecubital   04/08/19    2108    Antecubital   less than 1   Urethral Catheter TK Chan   03/24/19    1030     --   15   Incision (Closed) 03/18/19 Back Left;Lower;Lateral   03/18/19    0932     21          Intake/Output Last 24 hours  Intake/Output Summary (Last 24 hours) at 04/08/2019 2136 Last data filed at 04/08/2019 2052 Gross per 24 hour  Intake --  Output 1400 ml  Net -1400 ml    Labs/Imaging Results for orders placed or performed during the hospital encounter of 04/08/19 (from the past 48 hour(s))  Comprehensive metabolic panel     Status: Abnormal   Collection Time: 04/08/19  4:21 PM  Result Value Ref Range   Sodium 134 (L) 135 - 145 mmol/L   Potassium 5.4 (H) 3.5 - 5.1 mmol/L   Chloride 103 98 - 111 mmol/L   CO2 21 (L) 22 - 32 mmol/L   Glucose, Bld 104 (H) 70 - 99 mg/dL   BUN 62 (H) 8 - 23 mg/dL   Creatinine, Ser 3.94 (H) 0.61 - 1.24 mg/dL   Calcium 7.5 (L) 8.9 - 10.3 mg/dL   Total Protein 6.1 (L) 6.5 - 8.1 g/dL   Albumin 3.0 (L) 3.5 - 5.0 g/dL   AST 16 15 - 41 U/L   ALT 17 0 - 44 U/L   Alkaline Phosphatase 57 38 - 126 U/L   Total Bilirubin 0.5 0.3 - 1.2 mg/dL   GFR  calc non Af Amer 15 (L) >60 mL/min   GFR calc Af Amer 18 (L) >60 mL/min   Anion gap 10 5 - 15    Comment: Performed at [facility] [address] [place], [place] [address]  CBC with Differential     Status: Abnormal   Collection Time: 04/08/19  4:21 PM  Result Value Ref Range   WBC 11.7 (H) 4.0 - 10.5 K/uL   RBC 3.13 (L) 4.22 - 5.81 MIL/uL   Hemoglobin 10.2 (L) 13.0 - 17.0 g/dL   HCT 32.3 (L) 39.0 - 52.0 %   MCV 103.2 (H) 80.0 - 100.0 fL   MCH 32.6 26.0 - 34.0 pg   MCHC 31.6 30.0 - 36.0 g/dL   RDW 14.7 11.5 - 15.5 %   Platelets 224 150 - 400 K/uL   nRBC 0.0 0.0 - 0.2 %   Neutrophils Relative % 85 %   Neutro Abs 10.1 (H) 1.7 - 7.7 K/uL   Lymphocytes Relative 8 %   Lymphs Abs 0.9 0.7 - 4.0 K/uL   Monocytes Relative 6 %   Monocytes Absolute 0.7 0.1 - 1.0 K/uL   Eosinophils Relative 0 %   Eosinophils Absolute 0.0 0.0 - 0.5 K/uL   Basophils Relative 0 %   Basophils Absolute 0.0 0.0 - 0.1  K/uL   Immature Granulocytes 1 %   Abs Immature Granulocytes 0.07 0.00 - 0.07 K/uL    Comment: Performed at [facility], [place] [address], [place], [place] [address]  Brain natriuretic peptide     Status: Abnormal   Collection Time: 04/08/19  4:21 PM  Result Value Ref Range   B Natriuretic Peptide 118.8 (H) 0.0 - 100.0 pg/mL    Comment: Performed at [facility] [address] [place], [place] [address]  Urinalysis, Routine w reflex microscopic     Status: Abnormal   Collection Time: 04/08/19  5:17 PM  Result Value Ref Range   Color, Urine STRAW (A) YELLOW   APPearance CLEAR CLEAR   Specific Gravity, Urine 1.004 (L) 1.005 - 1.030   pH 7.0 5.0 - 8.0   Glucose, UA NEGATIVE NEGATIVE mg/dL   Hgb urine dipstick MODERATE (A) NEGATIVE   Bilirubin Urine NEGATIVE NEGATIVE   Ketones, ur NEGATIVE NEGATIVE mg/dL   Protein, ur 100 (A) NEGATIVE mg/dL   Nitrite NEGATIVE NEGATIVE   Leukocytes,Ua LARGE (A) NEGATIVE   RBC / HPF 0-5 0 - 5 RBC/hpf   WBC, UA >50 (H) 0 - 5 WBC/hpf   Bacteria, UA RARE (A) NONE SEEN   Mucus PRESENT     Comment: Performed at [facility] [address], [place], [place] [address]  CBG monitoring, ED     Status: None   Collection Time: 04/08/19  6:19 PM  Result Value Ref Range   Glucose-Capillary 82 70 - 99 mg/dL  CBG monitoring, ED     Status: Abnormal   Collection Time: 04/08/19  8:24 PM  Result Value Ref Range   Glucose-Capillary 122 (H) 70 - 99 mg/dL   DG Chest 2 View  Result Date: 04/08/2019 CLINICAL DATA:  Pt c/o leg edema x 1 day. Hx of DM AND HTN. Pt is a current smoker. EXAM: CHEST - 2 VIEW COMPARISON:  Chest radiograph 03/13/2019 FINDINGS: Stable cardiomediastinal contours with normal heart size. The lungs are clear. No pneumothorax or pleural effusion. No acute finding in the visualized skeleton. IMPRESSION: No active cardiopulmonary disease. Electronically Signed  By: Audie Pinto M.D.   On: 04/08/2019 16:40    Pending  Labs Unresulted Labs (From admission, onward)    Start     Ordered   04/09/19 3220  Basic metabolic panel  Tomorrow morning,   R     04/08/19 1945   04/09/19 0500  Magnesium  Tomorrow morning,   R     04/08/19 2002   04/09/19 0500  Phosphorus  Tomorrow morning,   R     04/08/19 2002   04/08/19 1943  TSH  Once,   STAT     04/08/19 1945   04/08/19 1746  SARS CORONAVIRUS 2 (TAT 6-24 HRS) Nasopharyngeal Nasopharyngeal Swab  (Tier 3 (TAT 6-24 hrs))  Once,   STAT    Question Answer Comment  Is this test for diagnosis or screening Screening   Symptomatic for COVID-19 as defined by CDC No   Hospitalized for COVID-19 No   Admitted to ICU for COVID-19 No   Previously tested for COVID-19 Yes   Resident in a congregate (group) care setting No   Employed in healthcare setting No      04/08/19 1745   04/08/19 1743  Urine culture  ONCE - STAT,   STAT     04/08/19 1742   04/08/19 1651  C difficile quick scan w PCR reflex  (C Difficile quick screen w PCR reflex panel)  Once, for 24 hours,   STAT     04/08/19 1650          Vitals/Pain Today's Vitals   04/08/19 1945 04/08/19 2000 04/08/19 2015 04/08/19 2100  BP: (!) 155/101 (!) 154/90 (!) 158/82 135/84  Pulse:      Resp: (!) 24 18 (!) 21 17  Temp:      TempSrc:      SpO2:      PainSc:        Isolation Precautions No active isolations  Medications Medications  acetaminophen (TYLENOL) tablet 500-1,000 mg (1,000 mg Oral Given 04/08/19 2012)  predniSONE (DELTASONE) tablet 10 mg (has no administration in time range)  tamsulosin (FLOMAX) capsule 0.4 mg (has no administration in time range)  heparin injection 5,000 Units (has no administration in time range)  isosorbide mononitrate (IMDUR) 24 hr tablet 30 mg (30 mg Oral Given 04/08/19 2012)  furosemide (LASIX) injection 40 mg (40 mg Intravenous Given 04/08/19 2010)  insulin aspart (novoLOG) injection 0-9 Units (has no administration in time range)  gabapentin (NEURONTIN) capsule 300 mg (has  no administration in time range)  carvedilol (COREG) tablet 3.125 mg (3.125 mg Oral Given 04/08/19 2022)  hydrALAZINE (APRESOLINE) tablet 25 mg (has no administration in time range)    Mobility walks Moderate fall risk   Focused Assessments Cardiac Assessment Handoff:    Lab Results  Component Value Date   CKTOTAL 161 08/25/2015   No results found for: DDIMER Does the Patient currently have chest pain? No     R Recommendations: See Admitting Provider Note  Report given to:   Additional Notes: N/A

## 2019-04-08 NOTE — ED Triage Notes (Signed)
Patient c/o swollen legs and feet since he was discharged from the hospital a few weeks ago. Reports normal urine output, keeps a chart at home.

## 2019-04-08 NOTE — ED Notes (Signed)
Called 2C to give report; RN currently in another room with a pt. Will return my call shortly.

## 2019-04-09 ENCOUNTER — Other Ambulatory Visit (HOSPITAL_COMMUNITY): Payer: BC Managed Care – PPO

## 2019-04-09 ENCOUNTER — Inpatient Hospital Stay (HOSPITAL_COMMUNITY): Payer: BC Managed Care – PPO

## 2019-04-09 DIAGNOSIS — N139 Obstructive and reflux uropathy, unspecified: Secondary | ICD-10-CM

## 2019-04-09 DIAGNOSIS — N10 Acute tubulo-interstitial nephritis: Secondary | ICD-10-CM

## 2019-04-09 DIAGNOSIS — R601 Generalized edema: Secondary | ICD-10-CM

## 2019-04-09 DIAGNOSIS — N179 Acute kidney failure, unspecified: Secondary | ICD-10-CM

## 2019-04-09 LAB — MRSA PCR SCREENING: MRSA by PCR: NEGATIVE

## 2019-04-09 LAB — BASIC METABOLIC PANEL
Anion gap: 10 (ref 5–15)
BUN: 66 mg/dL — ABNORMAL HIGH (ref 8–23)
CO2: 22 mmol/L (ref 22–32)
Calcium: 7.1 mg/dL — ABNORMAL LOW (ref 8.9–10.3)
Chloride: 103 mmol/L (ref 98–111)
Creatinine, Ser: 3.96 mg/dL — ABNORMAL HIGH (ref 0.61–1.24)
GFR calc Af Amer: 18 mL/min — ABNORMAL LOW (ref >60)
GFR calc non Af Amer: 15 mL/min — ABNORMAL LOW (ref >60)
Glucose, Bld: 124 mg/dL — ABNORMAL HIGH (ref 70–99)
Potassium: 4.9 mmol/L (ref 3.5–5.1)
Sodium: 135 mmol/L (ref 135–145)

## 2019-04-09 LAB — MAGNESIUM: Magnesium: 1.7 mg/dL (ref 1.7–2.4)

## 2019-04-09 LAB — GLUCOSE, CAPILLARY
Glucose-Capillary: 118 mg/dL — ABNORMAL HIGH (ref 70–99)
Glucose-Capillary: 123 mg/dL — ABNORMAL HIGH (ref 70–99)
Glucose-Capillary: 144 mg/dL — ABNORMAL HIGH (ref 70–99)
Glucose-Capillary: 179 mg/dL — ABNORMAL HIGH (ref 70–99)
Glucose-Capillary: 235 mg/dL — ABNORMAL HIGH (ref 70–99)
Glucose-Capillary: 87 mg/dL (ref 70–99)

## 2019-04-09 LAB — PHOSPHORUS: Phosphorus: 6.3 mg/dL — ABNORMAL HIGH (ref 2.5–4.6)

## 2019-04-09 MED ORDER — FENTANYL CITRATE (PF) 100 MCG/2ML IJ SOLN
12.5000 ug | Freq: Once | INTRAMUSCULAR | Status: AC
  Administered 2019-04-09: 12.5 ug via INTRAVENOUS
  Filled 2019-04-09: qty 2

## 2019-04-09 MED ORDER — PREDNISONE 20 MG PO TABS
20.0000 mg | ORAL_TABLET | Freq: Every day | ORAL | Status: DC
  Administered 2019-04-09 – 2019-04-12 (×4): 20 mg via ORAL
  Filled 2019-04-09 (×4): qty 1

## 2019-04-09 MED ORDER — CHLORHEXIDINE GLUCONATE CLOTH 2 % EX PADS
6.0000 | MEDICATED_PAD | Freq: Every day | CUTANEOUS | Status: DC
  Administered 2019-04-09 – 2019-04-12 (×4): 6 via TOPICAL

## 2019-04-09 NOTE — Consult Note (Signed)
[facility]  HISTORY AND PHYSICAL  Louis Butler is an 64 y.o. male.    Chief Complaint:  Swelling  HPI: Pt is a 52M with a PMH sig for HTN, HLD, DM and a recent diagnosis of AIN who is now seen in consultation at the request of Dr. Louanne Belton for eval and recs re: anasarca.  Briefly, pt was admitted in December (discharetd 12/21) for AKI, biopsy revealing AIN.  He was discharged on a prednisone taper.  He didn't have swelling in the hospital so wasn't discharged on lasix, but he does note that he was swelling before he came in the first time.  He is on 10 mg daily of prednisone right now.  Also had obstructive uropathy and was discharged with a Foley catheter.   He doesn't really have any complaints today except the Foley catheter being uncomfortable.  Renal US 12/6 and 12/16 both with bilateral mod-severe hydro.    No f/c. N/v, SOB, CP.  His discharge Cr 12/21 was 4.7 and is now 3.9.  Other electrolytes are WNL.    PMH: Past Medical History:  Diagnosis Date  . ARF (acute renal failure) ([place]) 03/14/2019  . Cellulitis 03/14/2019   feet  . Diabetes mellitus without complication ([place])   . Hypertension   . Irregular heart beat   . UTI (urinary tract infection)    PSH: Past Surgical History:  Procedure Laterality Date  . WISDOM TOOTH EXTRACTION       Past Medical History:  Diagnosis Date  . ARF (acute renal failure) ([place]) 03/14/2019  . Cellulitis 03/14/2019   feet  . Diabetes mellitus without complication ([place])   . Hypertension   . Irregular heart beat   . UTI (urinary tract infection)     Medications:   Scheduled: . carvedilol  3.125 mg Oral BID WC  . Chlorhexidine Gluconate Cloth  6 each Topical Daily  . furosemide  40 mg Intravenous Daily  . gabapentin  300 mg Oral QHS  . heparin  5,000 Units Subcutaneous Q8H  . insulin aspart  0-9 Units Subcutaneous TID WC  . isosorbide mononitrate  30 mg Oral Daily  . predniSONE  10 mg Oral Q breakfast  . tamsulosin   0.4 mg Oral BID    Medications Prior to Admission  Medication Sig Dispense Refill  . acetaminophen (TYLENOL) 500 MG tablet Take 500-1,000 mg by mouth every 6 (six) hours as needed for headache (pain).    Marland Kitchen amLODipine (NORVASC) 2.5 MG tablet Take 1 tablet (2.5 mg total) by mouth daily. 30 tablet 0  . insulin aspart (NOVOLOG) 100 UNIT/ML injection Inject 0-15 Units into the skin 3 (three) times daily with meals. CBG < 70: Eat or drink something sweet and recheck. CBG 70 - 120: 0 units CBG 121 - 150: 2 units CBG 151 - 200: 3 units CBG 201 - 250: 5 units CBG 251 - 300: 8 units CBG 301 - 350: 11 units CBG 351 - 400: 15 units CBG > 400: call MD. 10 mL 0  . predniSONE (DELTASONE) 10 MG tablet Take 5 tablets daily for 1 day, then 4 tablets daily for 3 days, then 3 tablets daily for 3 days, then 2 tablets daily for 3 days, then 1 tablet daily for 3 days, then stop. (Patient taking differently: Take 10-50 mg by mouth See admin instructions. Take 5 tablets (50 mg) daily for 1 day, then 4 tablets (40 mg) daily for 3 days, then 3 tablets (30 mg) daily  for 3 days, then 2 tablets (20 mg) daily for 3 days, then 1 tablet (10 mg) daily for 3 days, then stop.) 35 tablet 0  . tamsulosin (FLOMAX) 0.4 MG CAPS capsule Take 1 capsule (0.4 mg total) by mouth 2 (two) times daily. 60 capsule 0  . thiamine 100 MG tablet Take 1 tablet (100 mg total) by mouth daily. 30 tablet 0  . blood glucose meter kit and supplies KIT Dispense based on patient and insurance preference. Use up to four times daily as directed. (FOR ICD-9 250.00, 250.01). 1 each 0  . insulin aspart (NOVOLOG) 100 UNIT/ML injection Inject 4 Units into the skin 3 (three) times daily with meals. (Patient not taking: Reported on 04/08/2019) 10 mL 0  . Insulin Syringes, Disposable, U-100 0.5 ML MISC Use as per instructions 3 times daily AC. 100 each 1    ALLERGIES:   Allergies  Allergen Reactions  . Hydrocodone Hives and Itching  . Percocet  [Oxycodone-Acetaminophen] Hives and Itching    FAM HX: Family History  Problem Relation Age of Onset  . Diabetes type II Mother   . Lung cancer Mother   . Hypertension Mother   . Diabetes type II Father   . Diabetes type II Sister   . Diabetes type II Brother   . Hypertension Brother   . CAD Neg Hx     Social History:   reports that he has been smoking cigarettes. He has never used smokeless tobacco. He reports current alcohol use. He reports that he does not use drugs.  ROS: ROS: all other systems reviewed and are negative except as per HPI  Blood pressure (!) 148/92, pulse 83, temperature 98.5 F (36.9 C), temperature source Oral, resp. rate (!) 22, height 6' 3"  (1.905 m), weight 101.1 kg, SpO2 99 %. PHYSICAL EXAM: Physical Exam  GEN NAD. Sitting in bed HEENT EOMI PERRL NECK NO JVD PULM clear bilaterally CV RRR ABD soft, nontender EXT 3+ pitting edema to the thighs NEURO AAO x 3 no asterixis SKIN no rashes MSK no effusions   Results for orders placed or performed during the hospital encounter of 04/08/19 (from the past 48 hour(s))  Comprehensive metabolic panel     Status: Abnormal   Collection Time: 04/08/19  4:21 PM  Result Value Ref Range   Sodium 134 (L) 135 - 145 mmol/L   Potassium 5.4 (H) 3.5 - 5.1 mmol/L   Chloride 103 98 - 111 mmol/L   CO2 21 (L) 22 - 32 mmol/L   Glucose, Bld 104 (H) 70 - 99 mg/dL   BUN 62 (H) 8 - 23 mg/dL   Creatinine, Ser 3.94 (H) 0.61 - 1.24 mg/dL   Calcium 7.5 (L) 8.9 - 10.3 mg/dL   Total Protein 6.1 (L) 6.5 - 8.1 g/dL   Albumin 3.0 (L) 3.5 - 5.0 g/dL   AST 16 15 - 41 U/L   ALT 17 0 - 44 U/L   Alkaline Phosphatase 57 38 - 126 U/L   Total Bilirubin 0.5 0.3 - 1.2 mg/dL   GFR calc non Af Amer 15 (L) >60 mL/min   GFR calc Af Amer 18 (L) >60 mL/min   Anion gap 10 5 - 15    Comment: Performed at [facility], [address] [place], [place] [address]  CBC with Differential     Status: Abnormal   Collection Time: 04/08/19   4:21 PM  Result Value Ref Range   WBC 11.7 (H) 4.0 - 10.5  K/uL   RBC 3.13 (L) 4.22 - 5.81 MIL/uL   Hemoglobin 10.2 (L) 13.0 - 17.0 g/dL   HCT 32.3 (L) 39.0 - 52.0 %   MCV 103.2 (H) 80.0 - 100.0 fL   MCH 32.6 26.0 - 34.0 pg   MCHC 31.6 30.0 - 36.0 g/dL   RDW 14.7 11.5 - 15.5 %   Platelets 224 150 - 400 K/uL   nRBC 0.0 0.0 - 0.2 %   Neutrophils Relative % 85 %   Neutro Abs 10.1 (H) 1.7 - 7.7 K/uL   Lymphocytes Relative 8 %   Lymphs Abs 0.9 0.7 - 4.0 K/uL   Monocytes Relative 6 %   Monocytes Absolute 0.7 0.1 - 1.0 K/uL   Eosinophils Relative 0 %   Eosinophils Absolute 0.0 0.0 - 0.5 K/uL   Basophils Relative 0 %   Basophils Absolute 0.0 0.0 - 0.1 K/uL   Immature Granulocytes 1 %   Abs Immature Granulocytes 0.07 0.00 - 0.07 K/uL    Comment: Performed at [facility] [address], [place], [place] [address]  Brain natriuretic peptide     Status: Abnormal   Collection Time: 04/08/19  4:21 PM  Result Value Ref Range   B Natriuretic Peptide 118.8 (H) 0.0 - 100.0 pg/mL    Comment: Performed at [facility] [address], [place], [place] [address]  Urinalysis, Routine w reflex microscopic     Status: Abnormal   Collection Time: 04/08/19  5:17 PM  Result Value Ref Range   Color, Urine STRAW (A) YELLOW   APPearance CLEAR CLEAR   Specific Gravity, Urine 1.004 (L) 1.005 - 1.030   pH 7.0 5.0 - 8.0   Glucose, UA NEGATIVE NEGATIVE mg/dL   Hgb urine dipstick MODERATE (A) NEGATIVE   Bilirubin Urine NEGATIVE NEGATIVE   Ketones, ur NEGATIVE NEGATIVE mg/dL   Protein, ur 100 (A) NEGATIVE mg/dL   Nitrite NEGATIVE NEGATIVE   Leukocytes,Ua LARGE (A) NEGATIVE   RBC / HPF 0-5 0 - 5 RBC/hpf   WBC, UA >50 (H) 0 - 5 WBC/hpf   Bacteria, UA RARE (A) NONE SEEN   Mucus PRESENT     Comment: Performed at [facility] [address] [place], [place] [address]  CBG monitoring, ED     Status: None   Collection Time: 04/08/19  6:19 PM  Result Value Ref Range   Glucose-Capillary 82  70 - 99 mg/dL  SARS CORONAVIRUS 2 (TAT 6-24 HRS) Nasopharyngeal Nasopharyngeal Swab     Status: None   Collection Time: 04/08/19  6:21 PM   Specimen: Nasopharyngeal Swab  Result Value Ref Range   SARS Coronavirus 2 NEGATIVE NEGATIVE    Comment: (NOTE) SARS-CoV-2 target nucleic acids are NOT DETECTED. The SARS-CoV-2 RNA is generally detectable in upper and lower respiratory specimens during the acute phase of infection. Negative results do not preclude SARS-CoV-2 infection, do not rule out co-infections with other pathogens, and should not be used as the sole basis for treatment or other patient management decisions. Negative results must be combined with clinical observations, patient history, and epidemiological information. The expected result is Negative. Fact Sheet for Patients: SugarRoll.be Fact Sheet for Healthcare Providers: https://www.woods-mathews.com/ This test is not yet approved or cleared by the [place] FDA and  has been authorized for detection and/or diagnosis of SARS-CoV-2 by FDA under an Emergency Use Authorization (EUA). This EUA will remain  in effect (meaning this test can be used) for the duration  of the COVID-19 declaration under Section 56 4(b)(1) of the Act, 21 U.S.C. section 360bbb-3(b)(1), unless the authorization is terminated or revoked sooner. Performed at [facility], [place] [address] [place], [place] [address]   CBG monitoring, ED     Status: Abnormal   Collection Time: 04/08/19  8:24 PM  Result Value Ref Range   Glucose-Capillary 122 (H) 70 - 99 mg/dL  TSH     Status: None   Collection Time: 04/08/19  9:06 PM  Result Value Ref Range   TSH 2.839 0.350 - 4.500 uIU/mL    Comment: Performed by a 3rd Generation assay with a functional sensitivity of <=0.01 uIU/mL. Performed at [facility], [place] [address] [place], [place] [address]   Glucose, capillary     Status: Abnormal   Collection  Time: 04/09/19 12:26 AM  Result Value Ref Range   Glucose-Capillary 123 (H) 70 - 99 mg/dL   Comment 1 Notify RN    Comment 2 Document in Chart   Basic metabolic panel     Status: Abnormal   Collection Time: 04/09/19  2:04 AM  Result Value Ref Range   Sodium 135 135 - 145 mmol/L   Potassium 4.9 3.5 - 5.1 mmol/L   Chloride 103 98 - 111 mmol/L   CO2 22 22 - 32 mmol/L   Glucose, Bld 124 (H) 70 - 99 mg/dL   BUN 66 (H) 8 - 23 mg/dL   Creatinine, Ser 3.96 (H) 0.61 - 1.24 mg/dL   Calcium 7.1 (L) 8.9 - 10.3 mg/dL   GFR calc non Af Amer 15 (L) >60 mL/min   GFR calc Af Amer 18 (L) >60 mL/min   Anion gap 10 5 - 15    Comment: Performed at [facility] [address], [place], [place] [address]  Magnesium     Status: None   Collection Time: 04/09/19  2:04 AM  Result Value Ref Range   Magnesium 1.7 1.7 - 2.4 mg/dL    Comment: Performed at [facility] [address], [place], [place] [address]  Phosphorus     Status: Abnormal   Collection Time: 04/09/19  2:04 AM  Result Value Ref Range   Phosphorus 6.3 (H) 2.5 - 4.6 mg/dL    Comment: Performed at [facility] [address], [place], [place] [address]  Glucose, capillary     Status: Abnormal   Collection Time: 04/09/19  4:43 AM  Result Value Ref Range   Glucose-Capillary 118 (H) 70 - 99 mg/dL   Comment 1 Notify RN    Comment 2 Document in Chart   MRSA PCR Screening     Status: None   Collection Time: 04/09/19  4:51 AM   Specimen: Nasal Mucosa; Nasopharyngeal  Result Value Ref Range   MRSA by PCR NEGATIVE NEGATIVE    Comment:        The GeneXpert MRSA Assay (FDA approved for NASAL specimens only), is one component of a comprehensive MRSA colonization surveillance program. It is not intended to diagnose MRSA infection nor to guide or monitor treatment for MRSA infections. Performed at [facility], [place] [address] [place], [place] [address]   Glucose, capillary     Status: None   Collection Time:  04/09/19  6:48 AM  Result Value Ref Range   Glucose-Capillary 87 70 - 99 mg/dL    DG Chest 2 View  Result Date: 04/08/2019 CLINICAL DATA:  Pt c/o leg edema  x 1 day. Hx of DM AND HTN. Pt is a current smoker. EXAM: CHEST - 2 VIEW COMPARISON:  Chest radiograph 03/13/2019 FINDINGS: Stable cardiomediastinal contours with normal heart size. The lungs are clear. No pneumothorax or pleural effusion. No acute finding in the visualized skeleton. IMPRESSION: No active cardiopulmonary disease. Electronically Signed   By: Audie Pinto M.D.   On: 04/08/2019 16:40    Assessment/Plan  1.  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  Still has Foley.  I'll get another renal US to see if hydro is improved at all.  Will increase prednisone back to 20 mg daily as well and perhaps recommend a slower taper--> UA on admission with > 50 WBCs. Avoid PPIs and cephalosporin antibiotics if possible at this time--> unclear etiology of AIN per notes.    2.  Anasarca: Increase Lasix and follow.  Albumin hasn't been too bad at 3.0, better than previous hospitalization.  3.  HTN: home meds per primary  Jacey Pelc, Benjamine Mola 04/09/2019, 8:42 AM

## 2019-04-09 NOTE — Progress Notes (Addendum)
PROGRESS NOTE  Louis Butler:[id number] DOB: 1955-04-16 DOA: 04/08/2019 PCP: [place]   LOS: 1 day   Brief narrative: As per HPI, Louis Butler is a 64 y.o. male with medical history significant of diabetes mellitus, hypertension, questionarrhythmia, presented to the hospital with worsening of bilateral legs and feet swelling.  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  During last admission, he also developed urinary retention secondary to severe BPH and was sent home on Foley.Patient noted  increasing swelling of his legs and feet, and no longer fit into any of his shoes anymore.  ED Course: Patient was found to have anasarca and borderline hyperkalemia  Assessment/Plan:  Principal Problem:   Anasarca Active Problems:   Diabetes mellitus (HCC)   Hypertension   AKI (acute kidney injury) ([place])   Tobacco use  Anasarca,   Dr. Hollie Salk on call. On  lasix 40 mg daily IV with good urinary output.  Discontinue amlodipine. BNP of 118.  Albumin 3.0.  Acute kidney injury secondary to interstitial nephritis and obstructive uropathy.  Patient did have renal ultrasound on 12/6 and 12/16 with bilateral moderate hydronephrosis.  Patient does have a Foley catheter and will continue with that.  Continue prednisone.  Nephrology recommends slow taper.  Check renal ultrasound this time.  diabetic neuropathy, gabapentin at bedtime.  Hyperkalemia, improved. Was 5.4 on admission.  Will closely monitor.  Avoid potassium rich foods.  Tubular interstitial nephritis, on prednisone. UA with white cells and protein. Obtain urine culture. Monitor creatinine closely. Nephrology to follow.  We will try to avoid antibiotic at this time.  Check ultrasound of the kidneys.  Uncontrolled hypertension, started on Imdur, and Coreg,as needed hydralazine p.o. for systolic pressure more than 140.  improved with medication.  Frequent PVCs, on Coreg . Phosphorus of 6.3, Mag 1.7.  Will closely monitor.  Urinary retention, continue Foley for now, there was a plan for urology outpt visit probably for void trial.  He is scheduled for follow-up 04/22/2019 as per the patient. Continue Flomax.  Diabetes mellitus type 2.,   Continue sliding scale only for now. Glycemic control adequate so far.  Hypocalcemia, phosphorus of 6.3.. On vitamin D as outpatient.  Cigarette smoke, advised to quit.  Diarrhea.  Intermittent in nature.  Patient stated that he had 1 bowel movement today but was not that loose..  C. difficile  been requested.  VTE Prophylaxis: Heparin subcu  Code Status: Full code  Family Communication: Spoke with the patient in detail.  Disposition Plan: Home, uncertain at this time.  Follow nephrology recommendation.  Closely monitor BMP.   Consultants:  Nephrology  Procedures:  None  Antibiotics:  Anti-infectives (From admission, onward)   None     Subjective: Today, patient complains of mild diarrhea.  No abdominal pain.  No nausea, vomiting.  Wants to eat.  Objective: Vitals:   04/09/19 0005 04/09/19 0440  BP: 135/76 133/75  Pulse: 69 72  Resp: 17 15  Temp:  97.9 F (36.6 C)  SpO2: 100% 96%    Intake/Output Summary (Last 24 hours) at 04/09/2019 0719 Last data filed at 04/09/2019 0400 Gross per 24 hour  Intake 720 ml  Output 2925 ml  Net -2205 ml   Filed Weights   04/08/19 2233  Weight: 101.1 kg   Body mass index is 27.86 kg/m.   Physical Exam: GENERAL: Patient is alert awake and oriented. Not in obvious distress.  HENT: No scleral pallor or icterus. Pupils equally reactive to light. Oral mucosa is moist NECK: is supple, no palpable thyroid enlargement. CHEST: Clear to auscultation. No crackles or wheezes. Non tender on palpation. Diminished breath sounds bilaterally. CVS: S1 and S2 heard, no murmur. Regular rate and rhythm. No pericardial  rub. ABDOMEN: Soft, non-tender, bowel sounds are present.  Indwelling Foley catheter in place. EXTREMITIES: Generalized anasarca noted.  Pitting edema noted CNS: Cranial nerves are intact. No focal motor or sensory deficits. SKIN: warm and dry without rashes.  Data Review: I have personally reviewed the following laboratory data and studies,  CBC: Recent Labs  Lab 04/08/19 1621  WBC 11.7*  NEUTROABS 10.1*  HGB 10.2*  HCT 32.3*  MCV 103.2*  PLT 062   Basic Metabolic Panel: Recent Labs  Lab 04/08/19 1621 04/09/19 0204  NA 134* 135  K 5.4* 4.9  CL 103 103  CO2 21* 22  GLUCOSE 104* 124*  BUN 62* 66*  CREATININE 3.94* 3.96*  CALCIUM 7.5* 7.1*  MG  --  1.7  PHOS  --  6.3*   Liver Function Tests: Recent Labs  Lab 04/08/19 1621  AST 16  ALT 17  ALKPHOS 57  BILITOT 0.5  PROT 6.1*  ALBUMIN 3.0*   No results for input(s): LIPASE, AMYLASE in the last 168 hours. No results for input(s): AMMONIA in the last 168 hours. Cardiac Enzymes: No results for input(s): CKTOTAL, CKMB, CKMBINDEX, TROPONINI in the last 168 hours. BNP (last 3 results) Recent Labs    04/08/19 1621  BNP 118.8*    ProBNP (last 3 results) No results for input(s): PROBNP in the last 8760 hours.  CBG: Recent Labs  Lab 04/08/19 1819 04/08/19 2024 04/09/19 0026 04/09/19 0443 04/09/19 0648  GLUCAP 82 122* 123* 118* 87   Recent Results (from the past 240 hour(s))  SARS CORONAVIRUS 2 (TAT 6-24 HRS) Nasopharyngeal Nasopharyngeal Swab     Status: None   Collection Time: 04/08/19  6:21 PM   Specimen: Nasopharyngeal Swab  Result Value Ref Range Status   SARS Coronavirus 2 NEGATIVE NEGATIVE Final    Comment: (NOTE) SARS-CoV-2 target nucleic acids are NOT DETECTED. The SARS-CoV-2 RNA is generally detectable in upper and lower respiratory specimens during the acute phase of infection. Negative results do not preclude SARS-CoV-2 infection, do not rule out co-infections with other pathogens, and  should not be used as the sole basis for treatment or other patient management decisions. Negative results must be combined with clinical observations, patient history, and epidemiological information. The expected result is Negative. Fact Sheet for Patients: SugarRoll.be Fact Sheet for Healthcare Providers: https://www.woods-mathews.com/ This test is not yet approved or cleared by the [place] FDA and  has been authorized for detection and/or diagnosis of SARS-CoV-2 by FDA under an Emergency Use Authorization (EUA). This EUA will remain  in effect (meaning this test can be used) for the duration of the COVID-19 declaration under Section 56 4(b)(1) of the Act, 21 U.S.C. section 360bbb-3(b)(1), unless the authorization is terminated or revoked sooner. Performed at [facility], [place] [address] [place], [place] [address]   MRSA PCR Screening     Status: None   Collection Time: 04/09/19  4:51 AM   Specimen: Nasal Mucosa; Nasopharyngeal  Result Value Ref Range Status   MRSA by PCR NEGATIVE NEGATIVE Final    Comment:        The GeneXpert MRSA Assay (FDA approved for NASAL specimens only), is one component of a  comprehensive MRSA colonization surveillance program. It is not intended to diagnose MRSA infection nor to guide or monitor treatment for MRSA infections. Performed at [facility], [place] [address], [place], [place] [address]      Studies: DG Chest 2 View  Result Date: 04/08/2019 CLINICAL DATA:  Pt c/o leg edema x 1 day. Hx of DM AND HTN. Pt is a current smoker. EXAM: CHEST - 2 VIEW COMPARISON:  Chest radiograph 03/13/2019 FINDINGS: Stable cardiomediastinal contours with normal heart size. The lungs are clear. No pneumothorax or pleural effusion. No acute finding in the visualized skeleton. IMPRESSION: No active cardiopulmonary disease. Electronically Signed   By: Audie Pinto M.D.   On: 04/08/2019 16:40     Scheduled Meds: . carvedilol  3.125 mg Oral BID WC  . furosemide  40 mg Intravenous Daily  . gabapentin  300 mg Oral QHS  . heparin  5,000 Units Subcutaneous Q8H  . insulin aspart  0-9 Units Subcutaneous TID WC  . isosorbide mononitrate  30 mg Oral Daily  . predniSONE  10 mg Oral Q breakfast  . tamsulosin  0.4 mg Oral BID    Continuous Infusions:   Flora Lipps, MD  [facility] 04/09/2019

## 2019-04-09 NOTE — Evaluation (Signed)
Physical Therapy Evaluation Patient Details Name: Louis Butler MRN: [id number] DOB: Nov 06, 1955 Today's Date: 04/09/2019   History of Present Illness  64 y.o. male with medical history significant of diabetes mellitus, hypertension,?  arrhythmia, presented with worsening of bilateral legs and feet swelling. Pt recently admitted for AKI with interstitial nephritis, developing urinary retention 2/2 severe BPH.  Clinical Impression  Pt presents to PT with no significant deficits compared to baseline, other than noted edema in BLE. Pt is able to ambulate at a supervision level at this time and perform all other mobility independently. Pt reports a history of falls but does not demonstrate any LOB during this evaluation. Pt is encouraged to utilize cane upon discharge home to mitigate falls risk. Pt declines the need for outpatient PT to improve balance and reduce falls risk. Pt will benefit from multiple bouts of ambulation outside of the room for the remainder of his hospitalization. Acute PT signing off 04/09/2019.    Follow Up Recommendations No PT follow up(pt declining possibility of outpatient PT for balance)    Equipment Recommendations  None recommended by PT(pt owns necessary DME, encouraged to utilize cane for walkin)    Recommendations for Other Services       Precautions / Restrictions Precautions Precautions: Fall Restrictions Weight Bearing Restrictions: No      Mobility  Bed Mobility Overal bed mobility: Independent                Transfers Overall transfer level: Independent                  Ambulation/Gait Ambulation/Gait assistance: Supervision Gait Distance (Feet): 200 Feet Assistive device: None Gait Pattern/deviations: Step-to pattern;Drifts right/left Gait velocity: functional Gait velocity interpretation: 1.31 - 2.62 ft/sec, indicative of limited community ambulator General Gait Details: pt with slight left/right drift during gait, no noticeable  LOB during session however reporting falls history  Stairs            Wheelchair Mobility    Modified Rankin (Stroke Patients Only)       Balance Overall balance assessment: Needs assistance Sitting-balance support: No upper extremity supported;Feet supported Sitting balance-Leahy Scale: Normal     Standing balance support: No upper extremity supported Standing balance-Leahy Scale: Good Standing balance comment: supervision                             Pertinent Vitals/Pain Pain Assessment: 0-10 Pain Score: 4  Pain Location: BLE Pain Descriptors / Indicators: Sore Pain Intervention(s): Limited activity within patient's tolerance    Home Living Family/patient expects to be discharged to:: Private residence Living Arrangements: Spouse/significant other Available Help at Discharge: Family;Available 24 hours/day Type of Home: House Home Access: Stairs to enter Entrance Stairs-Rails: Right Entrance Stairs-Number of Steps: 4 Home Layout: One level Home Equipment: Cane - single point;Wheelchair - [employer] - 2 wheels;Shower seat      Prior Function Level of Independence: Independent         Comments: working up until last week of november, does report a history of falls     Hand Dominance        Extremity/Trunk Assessment   Upper Extremity Assessment Upper Extremity Assessment: Overall WFL for tasks assessed    Lower Extremity Assessment Lower Extremity Assessment: RLE deficits/detail;LLE deficits/detail RLE Deficits / Details: Strength WFL, edema noted RLE Sensation: decreased light touch LLE Deficits / Details: strength and ROM WFL, edema noted LLE Sensation: decreased light touch  Cervical / Trunk Assessment Cervical / Trunk Assessment: Normal  Communication   Communication: No difficulties  Cognition Arousal/Alertness: Awake/alert Behavior During Therapy: WFL for tasks assessed/performed Overall Cognitive Status: Within  Functional Limits for tasks assessed                                        General Comments General comments (skin integrity, edema, etc.): VSS, on RA    Exercises     Assessment/Plan    PT Assessment Patent does not need any further PT services  PT Problem List         PT Treatment Interventions      PT Goals (Current goals can be found in the Care Plan section)       Frequency     Barriers to discharge        Co-evaluation               AM-PAC PT "6 Clicks" Mobility  Outcome Measure Help needed turning from your back to your side while in a flat bed without using bedrails?: None Help needed moving from lying on your back to sitting on the side of a flat bed without using bedrails?: None Help needed moving to and from a bed to a chair (including a wheelchair)?: None Help needed standing up from a chair using your arms (e.g., wheelchair or bedside chair)?: None Help needed to walk in hospital room?: None Help needed climbing 3-5 steps with a railing? : None 6 Click Score: 24    End of Session Equipment Utilized During Treatment: (none) Activity Tolerance: Patient tolerated treatment well Patient left: in bed;with call bell/phone within reach Nurse Communication: Mobility status PT Visit Diagnosis: Repeated falls (R29.6)    Time: 3643-8377 PT Time Calculation (min) (ACUTE ONLY): 18 min   Charges:   PT Evaluation $PT Eval Low Complexity: [place], PT, DPT Acute Rehabilitation Pager: [phone]   Zenaida Niece 04/09/2019, 10:37 AM

## 2019-04-10 LAB — COMPREHENSIVE METABOLIC PANEL
ALT: 13 U/L (ref 0–44)
AST: 13 U/L — ABNORMAL LOW (ref 15–41)
Albumin: 2.4 g/dL — ABNORMAL LOW (ref 3.5–5.0)
Alkaline Phosphatase: 50 U/L (ref 38–126)
Anion gap: 8 (ref 5–15)
BUN: 69 mg/dL — ABNORMAL HIGH (ref 8–23)
CO2: 24 mmol/L (ref 22–32)
Calcium: 7.2 mg/dL — ABNORMAL LOW (ref 8.9–10.3)
Chloride: 103 mmol/L (ref 98–111)
Creatinine, Ser: 4.2 mg/dL — ABNORMAL HIGH (ref 0.61–1.24)
GFR calc Af Amer: 16 mL/min — ABNORMAL LOW (ref >60)
GFR calc non Af Amer: 14 mL/min — ABNORMAL LOW (ref >60)
Glucose, Bld: 127 mg/dL — ABNORMAL HIGH (ref 70–99)
Potassium: 5.1 mmol/L (ref 3.5–5.1)
Sodium: 135 mmol/L (ref 135–145)
Total Bilirubin: 0.4 mg/dL (ref 0.3–1.2)
Total Protein: 4.9 g/dL — ABNORMAL LOW (ref 6.5–8.1)

## 2019-04-10 LAB — URINE CULTURE: Culture: >=100000 — AB

## 2019-04-10 LAB — CBC
HCT: 25.5 % — ABNORMAL LOW (ref 39.0–52.0)
Hemoglobin: 8.3 g/dL — ABNORMAL LOW (ref 13.0–17.0)
MCH: 32.4 pg (ref 26.0–34.0)
MCHC: 32.5 g/dL (ref 30.0–36.0)
MCV: 99.6 fL (ref 80.0–100.0)
Platelets: 164 10*3/uL (ref 150–400)
RBC: 2.56 MIL/uL — ABNORMAL LOW (ref 4.22–5.81)
RDW: 14.2 % (ref 11.5–15.5)
WBC: 7.1 10*3/uL (ref 4.0–10.5)
nRBC: 0 % (ref 0.0–0.2)

## 2019-04-10 LAB — GLUCOSE, CAPILLARY
Glucose-Capillary: 101 mg/dL — ABNORMAL HIGH (ref 70–99)
Glucose-Capillary: 235 mg/dL — ABNORMAL HIGH (ref 70–99)
Glucose-Capillary: 290 mg/dL — ABNORMAL HIGH (ref 70–99)

## 2019-04-10 LAB — PHOSPHORUS: Phosphorus: 5.3 mg/dL — ABNORMAL HIGH (ref 2.5–4.6)

## 2019-04-10 LAB — C DIFFICILE QUICK SCREEN W PCR REFLEX
C Diff antigen: NEGATIVE
C Diff interpretation: NOT DETECTED
C Diff toxin: NEGATIVE

## 2019-04-10 LAB — MAGNESIUM: Magnesium: 1.8 mg/dL (ref 1.7–2.4)

## 2019-04-10 MED ORDER — LOPERAMIDE HCL 2 MG PO CAPS
2.0000 mg | ORAL_CAPSULE | ORAL | Status: DC | PRN
  Administered 2019-04-11: 2 mg via ORAL
  Filled 2019-04-10: qty 1

## 2019-04-10 MED ORDER — CIPROFLOXACIN HCL 500 MG PO TABS
250.0000 mg | ORAL_TABLET | Freq: Two times a day (BID) | ORAL | Status: AC
  Administered 2019-04-10 – 2019-04-11 (×4): 250 mg via ORAL
  Filled 2019-04-10 (×4): qty 1

## 2019-04-10 MED ORDER — FAMOTIDINE 20 MG PO TABS
20.0000 mg | ORAL_TABLET | Freq: Every day | ORAL | Status: DC
  Administered 2019-04-10 – 2019-04-12 (×3): 20 mg via ORAL
  Filled 2019-04-10: qty 1
  Filled 2019-04-10: qty 2
  Filled 2019-04-10: qty 1

## 2019-04-10 NOTE — Progress Notes (Addendum)
[facility] Progress Note    Assessment/ Plan:   1.  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  Still has Foley.  Repeat Renal US 1/2 with improved hydro.  Have increased prednisone back to 20 mg daily as well and perhaps recommend a slower taper. Avoid PPIs and cephalosporin antibiotics if possible at this time--> unclear etiology of AIN, had received ceftriaxone 12/6 and doesn't look like he had any quinolones that I can see.    2.  UTI: > 100,000 cfus Pseudomonas, have started cipro.  3.  Obstructive uropathy: detected on last admission, urology c/s, had failed a voiding trial previously and has OP followup with urology.  Flomax 0.4 mg BID  4.  Anasarca: Robust UOP with 40 IV lasix x 1, will HOLD today and reassess tomorrow; likely will need some diuretic  5.  HTN: home meds per primary  6.  Anemia: hgb 10.4-->8.3, send FOBT and place on Pepcid.  Subjective:    5L UOP yesterday.  Still with sig swelling. Cr up from 3.9--> 4.2.  Renal US with improved hydro.  Complaints of catheter pain at urethral meatus.     Objective:   BP (!) 162/83 (BP Location: Right Arm)   Pulse 70   Temp 98 F (36.7 C) (Tympanic)   Resp 18   Ht 6\' 3"  (1.905 m)   Wt 101.1 kg   SpO2 100%   BMI 27.86 kg/m   Intake/Output Summary (Last 24 hours) at 04/10/2019 0908 Last data filed at 04/10/2019 3300 Gross per 24 hour  Intake 1680 ml  Output 5025 ml  Net -3345 ml   Weight change:   Physical Exam: GEN NAD. Sitting in bed HEENT EOMI PERRL NECK NO JVD PULM clear bilaterally CV RRR ABD soft, nontender EXT 2+ pitting edema to the thighs--> improved somewhat NEURO AAO x 3 no asterixis SKIN no rashes MSK no effusions Imaging: DG Chest 2 View  Result Date: 04/08/2019 CLINICAL DATA:  Pt c/o leg edema x 1 day. Hx of DM AND HTN. Pt is a current smoker. EXAM: CHEST - 2 VIEW COMPARISON:  Chest radiograph 03/13/2019 FINDINGS: Stable cardiomediastinal contours with normal  heart size. The lungs are clear. No pneumothorax or pleural effusion. No acute finding in the visualized skeleton. IMPRESSION: No active cardiopulmonary disease. Electronically Signed   By: Audie Pinto M.D.   On: 04/08/2019 16:40   US RENAL  Result Date: 04/09/2019 CLINICAL DATA:  Obstructive uropathy, hydronephrosis EXAM: RENAL / URINARY TRACT ULTRASOUND COMPLETE COMPARISON:  03/23/2019 FINDINGS: Right Kidney: Renal measurements: 12.5 x 3.8 x 5.2 cm = volume: 127 mL. Mild increased cortical echogenicity. Residual mild right hydronephrosis, improved when compared to 03/23/2019. Upper pole renal cyst noted measuring up to 3.3 cm as before. Left Kidney: Renal measurements: 12.8 x 6.0 x 6 x 1 cm = volume: 243 mL. Similar increased echogenicity compatible with medical renal disease. Mild residual hydronephrosis, improved compared with the prior exam. Bladder: Decompressed by the Foley catheter. Other: None. IMPRESSION: Mild residual bilateral hydronephrosis, improved compared to 03/23/2019. 3.3 cm right upper pole exophytic renal cyst Electronically Signed   By: Jerilynn Mages.  Shick M.D.   On: 04/09/2019 14:10    Labs: BMET Recent Labs  Lab 04/08/19 1621 04/09/19 0204 04/10/19 0258  NA 134* 135 135  K 5.4* 4.9 5.1  CL 103 103 103  CO2 21* 22 24  GLUCOSE 104* 124* 127*  BUN 62* 66* 69*  CREATININE 3.94* 3.96* 4.20*  CALCIUM 7.5* 7.1* 7.2*  PHOS  --  6.3* 5.3*   CBC Recent Labs  Lab 04/08/19 1621 04/10/19 0258  WBC 11.7* 7.1  NEUTROABS 10.1*  --   HGB 10.2* 8.3*  HCT 32.3* 25.5*  MCV 103.2* 99.6  PLT 224 164    Medications:    . carvedilol  3.125 mg Oral BID WC  . Chlorhexidine Gluconate Cloth  6 each Topical Daily  . ciprofloxacin  250 mg Oral BID  . gabapentin  300 mg Oral QHS  . heparin  5,000 Units Subcutaneous Q8H  . insulin aspart  0-9 Units Subcutaneous TID WC  . isosorbide mononitrate  30 mg Oral Daily  . predniSONE  20 mg Oral Q breakfast  . tamsulosin  0.4 mg Oral BID       Madelon Lips, MD 04/10/2019, 9:08 AM

## 2019-04-10 NOTE — Plan of Care (Signed)

## 2019-04-10 NOTE — Progress Notes (Addendum)
PROGRESS NOTE  Louis Butler WSF:[id number] DOB: 1956/02/06 DOA: 04/08/2019 PCP: [place]   LOS: 2 days   Brief narrative: As per HPI, Louis Butler is a 64 y.o. male with medical history significant of diabetes mellitus, hypertension, questionarrhythmia, presented to the hospital with worsening of bilateral legs and feet swelling.  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  During last admission, he also developed urinary retention secondary to severe BPH and was sent home on Foley.Patient noted  increasing swelling of his legs and feet, and no longer fit into any of his shoes anymore.  ED Course: Patient was found to have anasarca and borderline hyperkalemia.  Patient was then admitted to hospital for further evaluation and treatment.  Assessment/Plan:  Principal Problem:   Anasarca Active Problems:   Diabetes mellitus ([place])   Hypertension   AKI (acute kidney injury) ([place])   Tobacco use  Anasarca.  Nephrology was consulted.  Patient received Lasix with good urinary output.  Lasix on hold today. off amlodipine. BNP of 118.  Albumin 3.0 on presentation.  Acute kidney injury secondary to interstitial nephritis and obstructive uropathy.  Patient did have renal ultrasound on 12/6 and 12/16 with bilateral moderate hydronephrosis.  Repeat ultrasound with improving hydronephrosis.  Patient does have a Foley catheter for failed voiding trial in the past.  Continue prednisone at 20 mg/day..  Nephrology recommends slow taper.    Pseudomonas UTI.  Started ciprofloxacin.  Diabetic neuropathy,  continue gabapentin at bedtime.  Hyperkalemia, improved. Was 5.4 on admission.  Potassium was 5.1 today.    Tubular interstitial nephritis, on prednisone. UA with white cells and protein.  Urine culture showed Pseudomonas UTI.  Patient has been started on ciprofloxacin as per  nephrology.  Renal dosing of ciprofloxacin.  Uncontrolled hypertension, started on Imdur, and Coreg, as needed hydralazine Blood pressure has improved.  Frequent PVCs, on Coreg .     Urinary retention,  voiding trial x1 in the past.  Continue Foley for now, there was a plan for urology outpt visit probably for void trial.  He is scheduled for follow-up 04/22/2019 as per the patient. Continue Flomax.  Diabetes mellitus type 2.   Continue sliding scale insulin, diabetic diet. Glycemic control adequate so far.  Point-of-care glucose was 101  Cigarette smoking. counseling done.  Diarrhea.  Intermittent in nature.  Consider as needed Imodium.  C. difficile was negative.  VTE Prophylaxis: Heparin subcu  Code Status: Full code  Family Communication: Spoke with the patient in detail.  I also spoke with the patient's spouse on the phone and updated about the clinical condition of the patient.  Answered queries she had.  Disposition Plan: Likely Home, uncertain at this time.  Follow nephrology recommendation.  Closely monitor BMP.   Consultants:  Nephrology  Procedures:  None  Antibiotics:  Anti-infectives (From admission, onward)   Start     Dose/Rate Route Frequency Ordered Stop   04/10/19 0815  ciprofloxacin (CIPRO) tablet 250 mg     250 mg Oral 2 times daily 04/10/19 0174       Subjective: Today, patient states that he feels okay.  Has been walking.  Had 2 loose bowel movements.  No shortness of breath nausea vomiting or chest pain.  Objective: Vitals:   04/10/19 0306 04/10/19 0720  BP: (!) 148/76 (!) 162/83  Pulse: 70 70  Resp: 16 18  Temp: 98.2 F (36.8 C) 98  F (36.7 C)  SpO2: 99% 100%    Intake/Output Summary (Last 24 hours) at 04/10/2019 1020 Last data filed at 04/10/2019 0800 Gross per 24 hour  Intake 1680 ml  Output 5525 ml  Net -3845 ml   Filed Weights   04/08/19 2233  Weight: 101.1 kg   Body mass index is 27.86 kg/m.   Physical Exam: GENERAL:  Patient is alert awake and oriented. Not in obvious distress. HENT: No scleral pallor or icterus. Pupils equally reactive to light. Oral mucosa is moist NECK: is supple, no palpable thyroid enlargement. CHEST: Clear to auscultation. No crackles or wheezes. Non tender on palpation. Diminished breath sounds bilaterally. CVS: S1 and S2 heard, no murmur. Regular rate and rhythm. No pericardial rub. ABDOMEN: Soft, non-tender, bowel sounds are present.  Indwelling Foley catheter in place. EXTREMITIES: Generalized anasarca noted.  Pitting edema noted CNS: Cranial nerves are intact. No focal motor or sensory deficits. SKIN: warm and dry without rashes.  Data Review: I have personally reviewed the following laboratory data and studies,  CBC: Recent Labs  Lab 04/08/19 1621 04/10/19 0258  WBC 11.7* 7.1  NEUTROABS 10.1*  --   HGB 10.2* 8.3*  HCT 32.3* 25.5*  MCV 103.2* 99.6  PLT 224 086   Basic Metabolic Panel: Recent Labs  Lab 04/08/19 1621 04/09/19 0204 04/10/19 0258  NA 134* 135 135  K 5.4* 4.9 5.1  CL 103 103 103  CO2 21* 22 24  GLUCOSE 104* 124* 127*  BUN 62* 66* 69*  CREATININE 3.94* 3.96* 4.20*  CALCIUM 7.5* 7.1* 7.2*  MG  --  1.7 1.8  PHOS  --  6.3* 5.3*   Liver Function Tests: Recent Labs  Lab 04/08/19 1621 04/10/19 0258  AST 16 13*  ALT 17 13  ALKPHOS 57 50  BILITOT 0.5 0.4  PROT 6.1* 4.9*  ALBUMIN 3.0* 2.4*   No results for input(s): LIPASE, AMYLASE in the last 168 hours. No results for input(s): AMMONIA in the last 168 hours. Cardiac Enzymes: No results for input(s): CKTOTAL, CKMB, CKMBINDEX, TROPONINI in the last 168 hours. BNP (last 3 results) Recent Labs    04/08/19 1621  BNP 118.8*    ProBNP (last 3 results) No results for input(s): PROBNP in the last 8760 hours.  CBG: Recent Labs  Lab 04/09/19 0648 04/09/19 1152 04/09/19 1645 04/09/19 2042 04/10/19 0635  GLUCAP 87 179* 235* 144* 101*   Recent Results (from the past 240 hour(s))  Urine  culture     Status: Abnormal   Collection Time: 04/08/19  6:21 PM   Specimen: Urine, Catheterized  Result Value Ref Range Status   Specimen Description URINE, CATHETERIZED  Final   Special Requests   Final    NONE Performed at [facility], [place] [address] [place], [place] [address]    Culture >=100,000 COLONIES/mL PSEUDOMONAS AERUGINOSA (A)  Final   Report Status 04/10/2019 FINAL  Final   Organism ID, Bacteria PSEUDOMONAS AERUGINOSA (A)  Final      Susceptibility   Pseudomonas aeruginosa - MIC*    CEFTAZIDIME 32 RESISTANT Resistant     CIPROFLOXACIN 0.5 SENSITIVE Sensitive     GENTAMICIN <=1 SENSITIVE Sensitive     IMIPENEM 2 SENSITIVE Sensitive     PIP/TAZO 64 SENSITIVE Sensitive     * >=100,000 COLONIES/mL PSEUDOMONAS AERUGINOSA  SARS CORONAVIRUS 2 (TAT 6-24 HRS) Nasopharyngeal Nasopharyngeal Swab     Status: None   Collection Time: 04/08/19  6:21 PM   Specimen: Nasopharyngeal  Swab  Result Value Ref Range Status   SARS Coronavirus 2 NEGATIVE NEGATIVE Final    Comment: (NOTE) SARS-CoV-2 target nucleic acids are NOT DETECTED. The SARS-CoV-2 RNA is generally detectable in upper and lower respiratory specimens during the acute phase of infection. Negative results do not preclude SARS-CoV-2 infection, do not rule out co-infections with other pathogens, and should not be used as the sole basis for treatment or other patient management decisions. Negative results must be combined with clinical observations, patient history, and epidemiological information. The expected result is Negative. Fact Sheet for Patients: SugarRoll.be Fact Sheet for Healthcare Providers: https://www.woods-mathews.com/ This test is not yet approved or cleared by the [place] FDA and  has been authorized for detection and/or diagnosis of SARS-CoV-2 by FDA under an Emergency Use Authorization (EUA). This EUA will remain  in effect (meaning this test can be  used) for the duration of the COVID-19 declaration under Section 56 4(b)(1) of the Act, 21 U.S.C. section 360bbb-3(b)(1), unless the authorization is terminated or revoked sooner. Performed at [facility], [place] [address] [place], [place] [address]   MRSA PCR Screening     Status: None   Collection Time: 04/09/19  4:51 AM   Specimen: Nasal Mucosa; Nasopharyngeal  Result Value Ref Range Status   MRSA by PCR NEGATIVE NEGATIVE Final    Comment:        The GeneXpert MRSA Assay (FDA approved for NASAL specimens only), is one component of a comprehensive MRSA colonization surveillance program. It is not intended to diagnose MRSA infection nor to guide or monitor treatment for MRSA infections. Performed at [facility], [place] [address] [place], [place] [address]   Culture, Urine     Status: Abnormal (Preliminary result)   Collection Time: 04/09/19 11:03 AM   Specimen: Urine, Random  Result Value Ref Range Status   Specimen Description URINE, RANDOM  Final   Special Requests NONE  Final   Culture (A)  Final    50,000 COLONIES/mL PSEUDOMONAS AERUGINOSA SUSCEPTIBILITIES TO FOLLOW CULTURE REINCUBATED FOR BETTER GROWTH Performed at [facility], [place] [address] [name], [place] [address]    Report Status PENDING  Incomplete  C difficile quick scan w PCR reflex     Status: None   Collection Time: 04/09/19  1:21 PM   Specimen: STOOL  Result Value Ref Range Status   C Diff antigen NEGATIVE NEGATIVE Final   C Diff toxin NEGATIVE NEGATIVE Final   C Diff interpretation No C. difficile detected.  Final    Comment: Performed at [facility], [place] [address] [place], [place] [address]     Studies: DG Chest 2 View  Result Date: 04/08/2019 CLINICAL DATA:  Pt c/o leg edema x 1 day. Hx of DM AND HTN. Pt is a current smoker. EXAM: CHEST - 2 VIEW COMPARISON:  Chest radiograph 03/13/2019 FINDINGS: Stable cardiomediastinal contours with normal heart size. The lungs are  clear. No pneumothorax or pleural effusion. No acute finding in the visualized skeleton. IMPRESSION: No active cardiopulmonary disease. Electronically Signed   By: Audie Pinto M.D.   On: 04/08/2019 16:40   US RENAL  Result Date: 04/09/2019 CLINICAL DATA:  Obstructive uropathy, hydronephrosis EXAM: RENAL / URINARY TRACT ULTRASOUND COMPLETE COMPARISON:  03/23/2019 FINDINGS: Right Kidney: Renal measurements: 12.5 x 3.8 x 5.2 cm = volume: 127 mL. Mild increased cortical echogenicity. Residual mild right hydronephrosis, improved when compared to 03/23/2019. Upper pole renal cyst noted measuring up to  3.3 cm as before. Left Kidney: Renal measurements: 12.8 x 6.0 x 6 x 1 cm = volume: 243 mL. Similar increased echogenicity compatible with medical renal disease. Mild residual hydronephrosis, improved compared with the prior exam. Bladder: Decompressed by the Foley catheter. Other: None. IMPRESSION: Mild residual bilateral hydronephrosis, improved compared to 03/23/2019. 3.3 cm right upper pole exophytic renal cyst Electronically Signed   By: Jerilynn Mages.  Shick M.D.   On: 04/09/2019 14:10    Scheduled Meds: . carvedilol  3.125 mg Oral BID WC  . Chlorhexidine Gluconate Cloth  6 each Topical Daily  . ciprofloxacin  250 mg Oral BID  . famotidine  20 mg Oral Daily  . gabapentin  300 mg Oral QHS  . heparin  5,000 Units Subcutaneous Q8H  . insulin aspart  0-9 Units Subcutaneous TID WC  . isosorbide mononitrate  30 mg Oral Daily  . predniSONE  20 mg Oral Q breakfast  . tamsulosin  0.4 mg Oral BID    Continuous Infusions:   Flora Lipps, MD  [facility] 04/10/2019

## 2019-04-11 LAB — CBC
HCT: 26.5 % — ABNORMAL LOW (ref 39.0–52.0)
Hemoglobin: 8.6 g/dL — ABNORMAL LOW (ref 13.0–17.0)
MCH: 32.1 pg (ref 26.0–34.0)
MCHC: 32.5 g/dL (ref 30.0–36.0)
MCV: 98.9 fL (ref 80.0–100.0)
Platelets: 155 10*3/uL (ref 150–400)
RBC: 2.68 MIL/uL — ABNORMAL LOW (ref 4.22–5.81)
RDW: 14 % (ref 11.5–15.5)
WBC: 7.8 10*3/uL (ref 4.0–10.5)
nRBC: 0 % (ref 0.0–0.2)

## 2019-04-11 LAB — GLUCOSE, CAPILLARY
Glucose-Capillary: 96 mg/dL (ref 70–99)
Glucose-Capillary: 153 mg/dL — ABNORMAL HIGH (ref 70–99)
Glucose-Capillary: 225 mg/dL — ABNORMAL HIGH (ref 70–99)
Glucose-Capillary: 263 mg/dL — ABNORMAL HIGH (ref 70–99)

## 2019-04-11 LAB — URINE CULTURE: Culture: 50000 — AB

## 2019-04-11 LAB — BASIC METABOLIC PANEL
Anion gap: 10 (ref 5–15)
BUN: 69 mg/dL — ABNORMAL HIGH (ref 8–23)
CO2: 21 mmol/L — ABNORMAL LOW (ref 22–32)
Calcium: 7 mg/dL — ABNORMAL LOW (ref 8.9–10.3)
Chloride: 103 mmol/L (ref 98–111)
Creatinine, Ser: 4.27 mg/dL — ABNORMAL HIGH (ref 0.61–1.24)
GFR calc Af Amer: 16 mL/min — ABNORMAL LOW (ref >60)
GFR calc non Af Amer: 14 mL/min — ABNORMAL LOW (ref >60)
Glucose, Bld: 167 mg/dL — ABNORMAL HIGH (ref 70–99)
Potassium: 4.7 mmol/L (ref 3.5–5.1)
Sodium: 134 mmol/L — ABNORMAL LOW (ref 135–145)

## 2019-04-11 LAB — MAGNESIUM: Magnesium: 1.7 mg/dL (ref 1.7–2.4)

## 2019-04-11 MED ORDER — CIPROFLOXACIN HCL 500 MG PO TABS
500.0000 mg | ORAL_TABLET | Freq: Every day | ORAL | Status: DC
  Administered 2019-04-12: 500 mg via ORAL
  Filled 2019-04-11: qty 1

## 2019-04-11 NOTE — Progress Notes (Signed)
PHARMACY NOTE:  ANTIMICROBIAL RENAL DOSAGE ADJUSTMENT  Current antimicrobial regimen includes a mismatch between antimicrobial dosage and estimated renal function.  As per policy approved by the Pharmacy & Therapeutics and Medical Executive Committees, the antimicrobial dosage will be adjusted accordingly.  Current antimicrobial dosage:  Ciprofloxacin 250mg  PO BID  Indication: UTI  Renal Function:  Estimated Creatinine Clearance: 21.2 mL/min (A) (by C-G formula based on SCr of 4.27 mg/dL (H)). []      On intermittent HD, scheduled: []      On CRRT    Antimicrobial dosage has been changed to:  Ciprofloxacin 500mg  PO qDay  Arrie Senate, PharmD, BCPS Clinical Pharmacist [phone] Please check AMION for all [place] numbers 04/11/2019

## 2019-04-11 NOTE — Plan of Care (Signed)

## 2019-04-11 NOTE — Progress Notes (Signed)
PROGRESS NOTE  Louis Butler DXA:[id number] DOB: September 09, 1955 DOA: 04/08/2019 PCP: Louis Butler   LOS: 3 days   Brief narrative: As per HPI, Louis Butler is a 64 y.o. male with medical history significant of diabetes mellitus, hypertension, questionarrhythmia, presented to the hospital with worsening of bilateral legs and feet swelling.  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  During last admission, he also developed urinary retention secondary to severe BPH and was sent home on Foley.Patient noted  increasing swelling of his legs and feet, and no longer fit into any of his shoes anymore.  ED Course: Patient was found to have anasarca and borderline hyperkalemia.  Patient was then admitted to hospital for further evaluation and treatment.  Assessment/Plan:  Principal Problem:   Anasarca Active Problems:   Diabetes mellitus ([place])   Hypertension   AKI (acute kidney injury) ([place])   Tobacco use  Anasarca.  Nephrology on board.  Patient initially received Lasix with good urinary output.  Lasix on hold since yesterday, off amlodipine. BNP of 118.  Albumin 3.0 on presentation.  Acute kidney injury secondary to interstitial nephritis and obstructive uropathy.  Patient did have renal ultrasound on 12/6 and 12/16 with bilateral moderate hydronephrosis.  Repeat ultrasound with improving hydronephrosis.  Patient does have a Foley catheter for failed voiding trial in the past.  Continue prednisone at 20 mg/day.  Nephrology recommends slow taper.  Will follow neurology recommendation.  Creatinine today of 4.2 from 4.2 yesterday.  No uremic symptoms at this time.  Significant urine output without diuretic.  Pseudomonas UTI.  On ciprofloxacin.  We will continue to complete the course.  Sensitive to ciprofloxacin  Diabetic neuropathy,  continue gabapentin at bedtime.   Hyperkalemia, improved. Was 5.4 on admission.  Potassium was 4.7 today.    Uncontrolled hypertension,  on presentation.  Continue  Imdur, and Coreg, as needed hydralazine. Blood pressure has improved.  Frequent PVCs, on Coreg .     Urinary retention,  failed voiding trial x1 in the past.  Continue Foley for now, there was a plan for urology outpt visit probably for void trial.  He is scheduled for urology follow-up 04/22/2019 as per the patient. Continue Flomax.  Diabetes mellitus type 2.   Continue sliding scale insulin, diabetic diet. Glycemic control adequate so far.  Point-of-care glucose was 153  Cigarette smoking. counseling done.  Diarrhea.  Intermittent in nature.  Consider as needed Imodium.  C. difficile was negative.  As per nursing staff he had one episode of loose stool today.  VTE Prophylaxis: Heparin subcu  Code Status: Full code  Family Communication: None today  Disposition Plan: Likely Home, uncertain at this time.  Follow nephrology recommendation regarding disposition..  Closely monitor BMP.   Consultants:  Nephrology  Procedures:  None  Antibiotics:  Anti-infectives (From admission, onward)   Start     Dose/Rate Route Frequency Ordered Stop   04/12/19 1000  ciprofloxacin (CIPRO) tablet 500 mg     500 mg Oral Daily 04/11/19 1231     04/10/19 0815  ciprofloxacin (CIPRO) tablet 250 mg     250 mg Oral 2 times daily 04/10/19 0808 04/11/19 2359     Subjective: Today, patient complains of one episode of loose bowel movement.  Denies abdominal pain nausea vomiting.  No shortness of breath cough or fever.  Patient did have significant urinary output without diuretic.  Objective: Vitals:  04/11/19 0738 04/11/19 1102  BP: (!) 145/83 (!) 142/82  Pulse: 75 79  Resp: 18 12  Temp: 97.8 F (36.6 C) 97.9 F (36.6 C)  SpO2: 100% 100%    Intake/Output Summary (Last 24 hours) at 04/11/2019 1330 Last data filed at 04/11/2019 1102 Gross per 24 hour  Intake  1440 ml  Output 4025 ml  Net -2585 ml   Filed Weights   04/08/19 2233  Weight: 101.1 kg   Body mass index is 27.86 kg/m.   Physical Exam: GENERAL: Patient is alert awake and oriented. Not in obvious distress. HENT: No scleral pallor or icterus. Pupils equally reactive to light. Oral mucosa is moist NECK: is supple, no palpable thyroid enlargement. CHEST: Clear to auscultation. No crackles or wheezes. Non tender on palpation. Diminished breath sounds bilaterally. CVS: S1 and S2 heard, no murmur. Regular rate and rhythm. No pericardial rub. ABDOMEN: Soft, non-tender, bowel sounds are present.  Indwelling Foley catheter in place. EXTREMITIES: Generalized anasarca noted.  Bilateral lower extremity 2+ pitting edema noted CNS: Cranial nerves are intact. No focal motor or sensory deficits. SKIN: warm and dry without rashes.  Data Review: I have personally reviewed the following laboratory data and studies,  CBC: Recent Labs  Lab 04/08/19 1621 04/10/19 0258 04/11/19 0244  WBC 11.7* 7.1 7.8  NEUTROABS 10.1*  --   --   HGB 10.2* 8.3* 8.6*  HCT 32.3* 25.5* 26.5*  MCV 103.2* 99.6 98.9  PLT 224 164 595   Basic Metabolic Panel: Recent Labs  Lab 04/08/19 1621 04/09/19 0204 04/10/19 0258 04/11/19 0244  NA 134* 135 135 134*  K 5.4* 4.9 5.1 4.7  CL 103 103 103 103  CO2 21* 22 24 21*  GLUCOSE 104* 124* 127* 167*  BUN 62* 66* 69* 69*  CREATININE 3.94* 3.96* 4.20* 4.27*  CALCIUM 7.5* 7.1* 7.2* 7.0*  MG  --  1.7 1.8 1.7  PHOS  --  6.3* 5.3*  --    Liver Function Tests: Recent Labs  Lab 04/08/19 1621 04/10/19 0258  AST 16 13*  ALT 17 13  ALKPHOS 57 50  BILITOT 0.5 0.4  PROT 6.1* 4.9*  ALBUMIN 3.0* 2.4*   No results for input(s): LIPASE, AMYLASE in the last 168 hours. No results for input(s): AMMONIA in the last 168 hours. Cardiac Enzymes: No results for input(s): CKTOTAL, CKMB, CKMBINDEX, TROPONINI in the last 168 hours. BNP (last 3 results) Recent Labs    04/08/19  1621  BNP 118.8*    ProBNP (last 3 results) No results for input(s): PROBNP in the last 8760 hours.  CBG: Recent Labs  Lab 04/10/19 0635 04/10/19 1600 04/10/19 2106 04/11/19 0635 04/11/19 1105  GLUCAP 101* 235* 290* 96 153*   Recent Results (from the past 240 hour(s))  Urine culture     Status: Abnormal   Collection Time: 04/08/19  6:21 PM   Specimen: Urine, Catheterized  Result Value Ref Range Status   Specimen Description URINE, CATHETERIZED  Final   Special Requests   Final    NONE Performed at [facility], [address] [place], [place] [address]    Culture >=100,000 COLONIES/mL PSEUDOMONAS AERUGINOSA (A)  Final   Report Status 04/10/2019 FINAL  Final   Organism ID, Bacteria PSEUDOMONAS AERUGINOSA (A)  Final      Susceptibility   Pseudomonas aeruginosa - MIC*    CEFTAZIDIME 32 RESISTANT Resistant     CIPROFLOXACIN 0.5 SENSITIVE Sensitive     GENTAMICIN <=1 SENSITIVE Sensitive  IMIPENEM 2 SENSITIVE Sensitive     PIP/TAZO 64 SENSITIVE Sensitive     * >=100,000 COLONIES/mL PSEUDOMONAS AERUGINOSA  SARS CORONAVIRUS 2 (TAT 6-24 HRS) Nasopharyngeal Nasopharyngeal Swab     Status: None   Collection Time: 04/08/19  6:21 PM   Specimen: Nasopharyngeal Swab  Result Value Ref Range Status   SARS Coronavirus 2 NEGATIVE NEGATIVE Final    Comment: (NOTE) SARS-CoV-2 target nucleic acids are NOT DETECTED. The SARS-CoV-2 RNA is generally detectable in upper and lower respiratory specimens during the acute phase of infection. Negative results do not preclude SARS-CoV-2 infection, do not rule out co-infections with other pathogens, and should not be used as the sole basis for treatment or other patient management decisions. Negative results must be combined with clinical observations, patient history, and epidemiological information. The expected result is Negative. Fact Sheet for Patients: SugarRoll.be Fact Sheet for Healthcare  Providers: https://www.woods-mathews.com/ This test is not yet approved or cleared by the [place] FDA and  has been authorized for detection and/or diagnosis of SARS-CoV-2 by FDA under an Emergency Use Authorization (EUA). This EUA will remain  in effect (meaning this test can be used) for the duration of the COVID-19 declaration under Section 56 4(b)(1) of the Act, 21 U.S.C. section 360bbb-3(b)(1), unless the authorization is terminated or revoked sooner. Performed at [facility], [place] [address], [place], [place] [address]   MRSA PCR Screening     Status: None   Collection Time: 04/09/19  4:51 AM   Specimen: Nasal Mucosa; Nasopharyngeal  Result Value Ref Range Status   MRSA by PCR NEGATIVE NEGATIVE Final    Comment:        The GeneXpert MRSA Assay (FDA approved for NASAL specimens only), is one component of a comprehensive MRSA colonization surveillance program. It is not intended to diagnose MRSA infection nor to guide or monitor treatment for MRSA infections. Performed at [facility], [place] [address] [place], [place] [address]   Culture, Urine     Status: Abnormal   Collection Time: 04/09/19 11:03 AM   Specimen: Urine, Random  Result Value Ref Range Status   Specimen Description URINE, RANDOM  Final   Special Requests   Final    NONE Performed at [facility], [place] [address], [place], [place] [address]    Culture 50,000 COLONIES/mL PSEUDOMONAS AERUGINOSA (A)  Final   Report Status 04/11/2019 FINAL  Final   Organism ID, Bacteria PSEUDOMONAS AERUGINOSA (A)  Final      Susceptibility   Pseudomonas aeruginosa - MIC*    CEFTAZIDIME 4 SENSITIVE Sensitive     CIPROFLOXACIN <=0.25 SENSITIVE Sensitive     GENTAMICIN <=1 SENSITIVE Sensitive     IMIPENEM 2 SENSITIVE Sensitive     PIP/TAZO 16 SENSITIVE Sensitive     CEFEPIME 2 SENSITIVE Sensitive     * 50,000 COLONIES/mL PSEUDOMONAS AERUGINOSA  C difficile quick scan w PCR reflex      Status: None   Collection Time: 04/09/19  1:21 PM   Specimen: STOOL  Result Value Ref Range Status   C Diff antigen NEGATIVE NEGATIVE Final   C Diff toxin NEGATIVE NEGATIVE Final   C Diff interpretation No C. difficile detected.  Final    Comment: Performed at [facility], [place] [address] [place], [place] [address]     Studies: US RENAL  Result Date: 04/09/2019 CLINICAL DATA:  Obstructive uropathy, hydronephrosis EXAM: RENAL / URINARY TRACT ULTRASOUND COMPLETE COMPARISON:  03/23/2019  FINDINGS: Right Kidney: Renal measurements: 12.5 x 3.8 x 5.2 cm = volume: 127 mL. Mild increased cortical echogenicity. Residual mild right hydronephrosis, improved when compared to 03/23/2019. Upper pole renal cyst noted measuring up to 3.3 cm as before. Left Kidney: Renal measurements: 12.8 x 6.0 x 6 x 1 cm = volume: 243 mL. Similar increased echogenicity compatible with medical renal disease. Mild residual hydronephrosis, improved compared with the prior exam. Bladder: Decompressed by the Foley catheter. Other: None. IMPRESSION: Mild residual bilateral hydronephrosis, improved compared to 03/23/2019. 3.3 cm right upper pole exophytic renal cyst Electronically Signed   By: Jerilynn Mages.  Shick M.D.   On: 04/09/2019 14:10    Scheduled Meds: . carvedilol  3.125 mg Oral BID WC  . Chlorhexidine Gluconate Cloth  6 each Topical Daily  . ciprofloxacin  250 mg Oral BID  . [START ON 04/12/2019] ciprofloxacin  500 mg Oral Daily  . famotidine  20 mg Oral Daily  . gabapentin  300 mg Oral QHS  . heparin  5,000 Units Subcutaneous Q8H  . insulin aspart  0-9 Units Subcutaneous TID WC  . isosorbide mononitrate  30 mg Oral Daily  . predniSONE  20 mg Oral Q breakfast  . tamsulosin  0.4 mg Oral BID    Continuous Infusions:   Flora Lipps, MD  [facility] 04/11/2019

## 2019-04-11 NOTE — Progress Notes (Signed)
Patient transferred to [address] via wheelchair with all belongings that he personally packed.  Patient's wife was made aware of transfer during her visit earlier this evening.

## 2019-04-11 NOTE — Progress Notes (Signed)
[facility] Progress Note    Assessment/ Plan:   1.  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  Still has Foley.  Repeat Renal US 1/2 with improved hydro.  Increased prednisone back to 20 mg daily as well and plan for a slower taper. Avoid PPIs and cephalosporin antibiotics if possible at this time--> unclear etiology of AIN, had received ceftriaxone 12/6 and doesn't look like he had any quinolones that I can see.    2.  UTI: > 100,000 cfus Pseudomonas, have started cipro.  3.  Obstructive uropathy: detected on last admission, urology c/s, had failed a voiding trial previously and has OP followup with urology.  Flomax 0.4 mg BID  4.  Anasarca: Robust UOP without diuretic yesterday. Improving but still with signficant edema  5.  HTN: home meds per primary  6.  Anemia: hgb 10.4-->8.3 --> 8.6, send FOBT (needs to be collected) and pt on Pepcid.  Dispo:  Maintain hospitalization, if stable to improved tomorrow I think discharge with close follow up will be ok.  Has f/u with Dr. Posey Pronto 1/15 but would need labs and eval prior to that.   Subjective:    5L UOP yesterday with no diuretic dosing.  Feels ok, no uremic symptoms.  Ambulating in halls. Cr stable ~4.2.     Objective:   BP (!) 145/83 (BP Location: Left Arm)   Pulse 75   Temp 97.8 F (36.6 C) (Oral)   Resp 18   Ht 6\' 3"  (1.905 m)   Wt 101.1 kg   SpO2 100%   BMI 27.86 kg/m   Intake/Output Summary (Last 24 hours) at 04/11/2019 0809 Last data filed at 04/11/2019 0503 Gross per 24 hour  Intake 1440 ml  Output 3725 ml  Net -2285 ml   Weight change:   Physical Exam: GEN NAD. Sitting in bed HEENT EOMI PERRL NECK NO JVD PULM clear bilaterally CV RRR ABD soft, nontender EXT 2+ pitting edema to the knees NEURO AAO x 3 no asterixis SKIN no rashes MSK no effusions Imaging: US RENAL  Result Date: 04/09/2019 CLINICAL DATA:  Obstructive uropathy, hydronephrosis EXAM: RENAL / URINARY TRACT  ULTRASOUND COMPLETE COMPARISON:  03/23/2019 FINDINGS: Right Kidney: Renal measurements: 12.5 x 3.8 x 5.2 cm = volume: 127 mL. Mild increased cortical echogenicity. Residual mild right hydronephrosis, improved when compared to 03/23/2019. Upper pole renal cyst noted measuring up to 3.3 cm as before. Left Kidney: Renal measurements: 12.8 x 6.0 x 6 x 1 cm = volume: 243 mL. Similar increased echogenicity compatible with medical renal disease. Mild residual hydronephrosis, improved compared with the prior exam. Bladder: Decompressed by the Foley catheter. Other: None. IMPRESSION: Mild residual bilateral hydronephrosis, improved compared to 03/23/2019. 3.3 cm right upper pole exophytic renal cyst Electronically Signed   By: Jerilynn Mages.  Shick M.D.   On: 04/09/2019 14:10    Labs: BMET Recent Labs  Lab 04/08/19 1621 04/09/19 0204 04/10/19 0258 04/11/19 0244  NA 134* 135 135 134*  K 5.4* 4.9 5.1 4.7  CL 103 103 103 103  CO2 21* 22 24 21*  GLUCOSE 104* 124* 127* 167*  BUN 62* 66* 69* 69*  CREATININE 3.94* 3.96* 4.20* 4.27*  CALCIUM 7.5* 7.1* 7.2* 7.0*  PHOS  --  6.3* 5.3*  --    CBC Recent Labs  Lab 04/08/19 1621 04/10/19 0258 04/11/19 0244  WBC 11.7* 7.1 7.8  NEUTROABS 10.1*  --   --   HGB 10.2* 8.3* 8.6*  HCT  32.3* 25.5* 26.5*  MCV 103.2* 99.6 98.9  PLT 224 164 155    Medications:    . carvedilol  3.125 mg Oral BID WC  . Chlorhexidine Gluconate Cloth  6 each Topical Daily  . ciprofloxacin  250 mg Oral BID  . famotidine  20 mg Oral Daily  . gabapentin  300 mg Oral QHS  . heparin  5,000 Units Subcutaneous Q8H  . insulin aspart  0-9 Units Subcutaneous TID WC  . isosorbide mononitrate  30 mg Oral Daily  . predniSONE  20 mg Oral Q breakfast  . tamsulosin  0.4 mg Oral BID

## 2019-04-12 DIAGNOSIS — Z720 Tobacco use: Secondary | ICD-10-CM

## 2019-04-12 DIAGNOSIS — I10 Essential (primary) hypertension: Secondary | ICD-10-CM

## 2019-04-12 DIAGNOSIS — E1129 Type 2 diabetes mellitus with other diabetic kidney complication: Secondary | ICD-10-CM

## 2019-04-12 LAB — RENAL FUNCTION PANEL
Albumin: 2.6 g/dL — ABNORMAL LOW (ref 3.5–5.0)
Anion gap: 10 (ref 5–15)
BUN: 73 mg/dL — ABNORMAL HIGH (ref 8–23)
CO2: 23 mmol/L (ref 22–32)
Calcium: 7.1 mg/dL — ABNORMAL LOW (ref 8.9–10.3)
Chloride: 102 mmol/L (ref 98–111)
Creatinine, Ser: 4.34 mg/dL — ABNORMAL HIGH (ref 0.61–1.24)
GFR calc Af Amer: 16 mL/min — ABNORMAL LOW (ref >60)
GFR calc non Af Amer: 14 mL/min — ABNORMAL LOW (ref >60)
Glucose, Bld: 169 mg/dL — ABNORMAL HIGH (ref 70–99)
Phosphorus: 4.1 mg/dL (ref 2.5–4.6)
Potassium: 5.2 mmol/L — ABNORMAL HIGH (ref 3.5–5.1)
Sodium: 135 mmol/L (ref 135–145)

## 2019-04-12 LAB — CBC
HCT: 27.1 % — ABNORMAL LOW (ref 39.0–52.0)
Hemoglobin: 8.7 g/dL — ABNORMAL LOW (ref 13.0–17.0)
MCH: 33 pg (ref 26.0–34.0)
MCHC: 32.1 g/dL (ref 30.0–36.0)
MCV: 102.7 fL — ABNORMAL HIGH (ref 80.0–100.0)
Platelets: 146 10*3/uL — ABNORMAL LOW (ref 150–400)
RBC: 2.64 MIL/uL — ABNORMAL LOW (ref 4.22–5.81)
RDW: 14 % (ref 11.5–15.5)
WBC: 7.8 10*3/uL (ref 4.0–10.5)
nRBC: 0 % (ref 0.0–0.2)

## 2019-04-12 LAB — GLUCOSE, CAPILLARY
Glucose-Capillary: 154 mg/dL — ABNORMAL HIGH (ref 70–99)
Glucose-Capillary: 271 mg/dL — ABNORMAL HIGH (ref 70–99)

## 2019-04-12 LAB — MAGNESIUM: Magnesium: 1.8 mg/dL (ref 1.7–2.4)

## 2019-04-12 MED ORDER — LOPERAMIDE HCL 2 MG PO CAPS: 2.0000 mg | ORAL_CAPSULE | ORAL | 0 refills | Status: DC | PRN

## 2019-04-12 MED ORDER — CARVEDILOL 3.125 MG PO TABS: 3.1250 mg | ORAL_TABLET | Freq: Two times a day (BID) | ORAL | 2 refills | Status: DC

## 2019-04-12 MED ORDER — ISOSORBIDE MONONITRATE ER 30 MG PO TB24: 30.0000 mg | ORAL_TABLET | Freq: Every day | ORAL | 2 refills | Status: DC

## 2019-04-12 MED ORDER — TORSEMIDE 20 MG PO TABS: 40.0000 mg | ORAL_TABLET | Freq: Every day | ORAL | 0 refills | Status: DC

## 2019-04-12 MED ORDER — GABAPENTIN 300 MG PO CAPS: 300.0000 mg | ORAL_CAPSULE | Freq: Every day | ORAL | 2 refills | Status: DC

## 2019-04-12 MED ORDER — FAMOTIDINE 20 MG PO TABS: 20.0000 mg | ORAL_TABLET | Freq: Every day | ORAL | 0 refills | Status: DC

## 2019-04-12 MED ORDER — PREDNISONE 20 MG PO TABS: 20.0000 mg | ORAL_TABLET | Freq: Every day | ORAL | 0 refills | Status: DC

## 2019-04-12 MED ORDER — CIPROFLOXACIN HCL 500 MG PO TABS: 500.0000 mg | ORAL_TABLET | Freq: Every day | ORAL | 0 refills | Status: AC

## 2019-04-12 NOTE — TOC Transition Note (Signed)
Transition of Care [facility]) - CM/SW Discharge Note   Patient Details  Name: Louis Butler MRN: [id number] Date of Birth: 1955/07/09  Transition of Care [facility]) CM/SW Contact:  Pollie Friar, RN Phone Number: 04/12/2019, 11:14 AM   Clinical Narrative:    Pt discharging home with self care. Pt has spouse at home, hospital f/u and transportation.    Final next level of care: Home/Self Care Barriers to Discharge: No Barriers Identified   Patient Goals and CMS Choice        Discharge Placement                       Discharge Plan and Services                                     Social Determinants of Health (SDOH) Interventions     Readmission Risk Interventions No flowsheet data found.

## 2019-04-12 NOTE — Discharge Summary (Signed)
 Physician Discharge Summary  Louis Butler MRN:[id number] DOB: 10/12/1955 DOA: 04/08/2019  PCP: [facility]  Admit date: 04/08/2019 Discharge date: 04/12/2019  Admitted From: Home  Discharge disposition: Home  Recommendations for Outpatient Follow-Up:    Follow up with your primary care provider on 1/7 and nephrology Dr Patel on 1/15.  Discharge Diagnosis:   Principal Problem:   Anasarca Active Problems:   Diabetes mellitus (HCC)   Hypertension   AKI (acute kidney injury) (HCC)   Tobacco use  Discharge Condition: Improved.  Diet recommendation:   Carbohydrate-modified.  Wound care: None.  Code status: Full.   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  ED Course:Patient was found to have anasarca and borderline hyperkalemia.  Patient was then admitted to hospital for further evaluation and treatment.   Hospital Course:  Following conditions were addressed during hospitalization as listed below, follow-up plan in italics  Anasarca with acute kidney injury on CKD stage to 3 with obstructive uropathy.  Nephrology was consulted and was closely followed the patient during hospitalization..  Patient initially received Lasix with good urinary output.  Patient had good diuresis with IV Lasix.  Nephrology has seen the patient on 04/12/2019 and recommend torsemide on discharge with closer monitoring of his kidney  function and volume status as per primary care physician. PCP to see the patient on 04/14/2019 and will need to decide on further diuresis.  Nephrology to see the patient on 04/22/2019.  Acute kidney injury secondary to interstitial nephritis and obstructive uropathy.  Patient did have renal ultrasound on 12/6 and 12/16 with bilateral moderate hydronephrosis.  Repeat ultrasound with improving hydronephrosis.  Patient does have a Foley catheter for failed voiding trial in the past.  Continue prednisone at 20 mg/day as recommendation.  Will be continued until seen by nephrology as outpatient. Nephrology recommends slow taper.    Creatinine today of 4.3 from 4.2 yesterday.  No uremic symptoms at this time.  Significant urine output in hospital without diuretic.  Pseudomonas UTI.    Started on p.o. ciprofloxacin.  We will continue to complete the course-renally dosed for  total of 7 days.  Diabetic neuropathy,  patient was initiated gabapentin at bedtime.  Has improved symptoms.  Hyperkalemia, improved. Was 5.4 on admission.  Potassium of 5.2 Please obtain BMP on 04/14/2019.  Uncontrolled hypertension, on presentation.  Continue  Imdur,and Coreg on discharge.  Could be adjusted as outpatient next visit.  Amlodipine has been discontinued due to peripheral edema.  Frequent PVCs,was started on on Coreg .     Improved  Urinary retention, failed voiding trial x1 in the past.  Continue Foley for now,there was a plan for urology outpt visit probably for void trial.  He is scheduled for nephrology follow-up on 04/22/2019 as per the patient. Continue Flomax.  Diabetes mellitus type 2.    Continue insulin regimen on discharge.  Diabetic diet.  Cigarette smoking. counseling done on abstinence.  Diarrhea.  Intermittent in nature.    Will prescribe as needed Imodium.  C. difficile was negative.      Disposition.  At this time, patient is stable for disposition home.  Medical Consultants:     Nephrology  Procedures:    None Subjective:   Today, patient feels okay.  Denies dyspnea, chest pain, fever or chills.  Discharge Exam:   Vitals:   04/12/19 0817 04/12/19 1030  BP: (!) 146/82 (!) 148/70  Pulse: 90   Resp: 18   Temp: 98.7 F (37.1 C)   SpO2: 100%    Vitals:   04/11/19 2317 04/12/19 0425 04/12/19 0817 04/12/19 1030  BP: (!) 172/87 (!) 148/80 (!) 146/82 (!) 148/70  Pulse: 77 79 90   Resp: _0 Temp: 98.4 F (36.9 C) 98 F (36.7 C) 98.7 F (37.1 C)   TempSrc: Oral  Oral   SpO2: 99% 98% 100%   Weight:      Height:       General: Alert awake, not in obvious distress HENT: pupils equally reacting to light and accommodation.  No scleral pallor or icterus noted. Oral mucosa is moist.  Chest:  Clear breath sounds.  Diminished breath sounds bilaterally. No crackles or wheezes.  CVS: S1 &S2 heard. No murmur.  Regular rate and rhythm. Abdomen: Soft, nontender, nondistended.  Bowel sounds are heard.  Foley catheter in place Extremities: No cyanosis, clubbing but bilateral lower extremity edema noted.  Peripheral pulses are palpable. Psych: Alert, awake and oriented, normal mood CNS:  No cranial nerve deficits.  Power equal in all extremities.   Skin: Warm and dry.  No rashes noted.  The results of significant diagnostics from this hospitalization (including imaging, microbiology, ancillary and laboratory) are listed below for reference.     Diagnostic Studies:   DG Chest 2 View  Result Date: 04/08/2019 CLINICAL DATA:  Pt c/o leg edema x 1 day. Hx of DM AND HTN. Pt is a current smoker. EXAM: CHEST - 2 VIEW COMPARISON:  Chest radiograph 03/13/2019 FINDINGS: Stable cardiomediastinal contours with normal heart size. The lungs are clear. No pneumothorax or pleural effusion. No acute finding in the visualized skeleton. IMPRESSION: No active cardiopulmonary disease. Electronically Signed   By: Audie Pinto M.D.   On: 04/08/2019 16:40   US  RENAL  Result Date: 04/09/2019 CLINICAL DATA:  Obstructive uropathy, hydronephrosis EXAM: RENAL / URINARY TRACT ULTRASOUND COMPLETE COMPARISON:  03/23/2019 FINDINGS: Right Kidney: Renal measurements: 12.5 x 3.8 x 5.2 cm = volume: 127 mL. Mild increased cortical echogenicity. Residual mild right hydronephrosis, improved when compared to 03/23/2019. Upper pole renal cyst noted measuring up to 3.3 cm as before. Left Kidney: Renal measurements: 12.8 x 6.0 x 6 x 1 cm = volume: 243 mL. Similar increased echogenicity compatible with medical renal disease. Mild residual hydronephrosis, improved compared with the prior exam. Bladder: Decompressed by the Foley catheter. Other: None. IMPRESSION: Mild residual bilateral hydronephrosis, improved compared to 03/23/2019. 3.3 cm right upper pole exophytic renal cyst Electronically Signed   By: Jerilynn Mages.  Shick M.D.   On: 04/09/2019 14:10     Labs:   Basic Metabolic Panel: Recent Labs  Lab 04/08/19 1621 04/09/19 0204 04/10/19 0258 04/11/19 0244 04/12/19 0306  NA 134* 135 135 134* 135  K 5.4* 4.9 5.1 4.7 5.2*  CL 103 103 103 103 102  CO2 21* 22 24 21* 23  GLUCOSE 104* 124* 127* 167* 169*  BUN 62* 66* 69* 69* 73*  CREATININE 3.94* 3.96* 4.20* 4.27* 4.34*  CALCIUM 7.5* 7.1* 7.2* 7.0* 7.1*  MG  --  1.7 1.8 1.7 1.8  PHOS  --  6.3* 5.3*  --  4.1   GFR Estimated Creatinine Clearance: 20.8 mL/min (A) (by C-G formula based on SCr of 4.34 mg/dL (H)). Liver Function Tests: Recent Labs  Lab 04/08/19 1621 04/10/19 0258 04/12/19 0306  AST 16 13*  --   ALT 17 13  --   ALKPHOS 57 50  --   BILITOT 0.5 0.4  --   PROT 6.1* 4.9*  --   ALBUMIN 3.0* 2.4* 2.6*   No results for input(s): LIPASE, AMYLASE in the last 168 hours. No results for input(s): AMMONIA in the last 168 hours. Coagulation profile No results for input(s): INR, PROTIME in the last 168 hours.  CBC: Recent Labs  Lab 04/08/19 1621 04/10/19 0258 04/11/19 0244 04/12/19 0306  WBC 11.7* 7.1 7.8 7.8   NEUTROABS 10.1*  --   --   --   HGB 10.2* 8.3* 8.6* 8.7*  HCT 32.3* 25.5* 26.5* 27.1*  MCV 103.2* 99.6 98.9 102.7*  PLT 224 164 155 146*   Cardiac Enzymes: No results for input(s): CKTOTAL, CKMB, CKMBINDEX, TROPONINI in the last 168 hours. BNP: Invalid input(s): POCBNP CBG: Recent Labs  Lab 04/11/19 0635 04/11/19 1105 04/11/19 1621 04/11/19 2155 04/12/19 0605  GLUCAP 96 153* 225* 263* 154*   D-Dimer No results for input(s): DDIMER in the last 72 hours. Hgb A1c No results for input(s): HGBA1C in the last 72 hours. Lipid Profile No results for input(s): CHOL, HDL, LDLCALC, TRIG, CHOLHDL, LDLDIRECT in the last 72 hours. Thyroid function studies No results for input(s): TSH, T4TOTAL, T3FREE, THYROIDAB in the last 72 hours.  Invalid input(s): FREET3 Anemia work up No results for input(s): VITAMINB12, FOLATE, FERRITIN, TIBC, IRON, RETICCTPCT in the last 72 hours. Microbiology Recent Results (from the past 240 hour(s))  Urine culture     Status: Abnormal   Collection Time: 04/08/19  6:21 PM   Specimen: Urine, Catheterized  Result Value Ref Range Status   Specimen Description URINE, CATHETERIZED  Final   Special Requests   Final    NONE Performed at [facility], [address] [place], [place] [address]    Culture >=100,000 COLONIES/mL PSEUDOMONAS AERUGINOSA (A)  Final   Report Status 04/10/2019 FINAL  Final   Organism ID, Bacteria PSEUDOMONAS AERUGINOSA (A)  Final      Susceptibility   Pseudomonas aeruginosa - MIC*    CEFTAZIDIME 32 RESISTANT Resistant     CIPROFLOXACIN 0.5 SENSITIVE Sensitive     GENTAMICIN <=1 SENSITIVE Sensitive     IMIPENEM 2 SENSITIVE Sensitive     PIP/TAZO 64 SENSITIVE Sensitive     * >=100,000 COLONIES/mL PSEUDOMONAS AERUGINOSA  SARS CORONAVIRUS 2 (TAT 6-24 HRS) Nasopharyngeal Nasopharyngeal Swab     Status: None   Collection Time: 04/08/19  6:21 PM   Specimen: Nasopharyngeal Swab  Result Value Ref Range Status   SARS Coronavirus 2  NEGATIVE NEGATIVE Final    Comment: (NOTE) SARS-CoV-2 target nucleic acids are NOT DETECTED. The SARS-CoV-2 RNA is generally detectable in upper and lower respiratory specimens during the acute phase of infection. Negative results do not preclude SARS-CoV-2 infection, do not rule out co-infections with other pathogens, and should not be used as the sole basis for treatment or other patient management decisions. Negative results must be combined with clinical observations, patient history, and epidemiological information. The expected result is Negative. Fact Sheet for Patients: SugarRoll.be Fact Sheet for Healthcare Providers: https://www.woods-mathews.com/ This test is not yet approved or cleared by the [place]  States FDA and  has been authorized for detection and/or diagnosis of SARS-CoV-2 by FDA under an Emergency Use Authorization (EUA). This EUA will remain  in effect (meaning this test can be used) for the duration of the COVID-19 declaration under Section 56 4(b)(1) of the Act, 21 U.S.C. section 360bbb-3(b)(1), unless the authorization is terminated or revoked sooner. Performed at [facility], [address] [place], [place] [address]   MRSA PCR Screening     Status: None   Collection Time: 04/09/19  4:51 AM   Specimen: Nasal Mucosa; Nasopharyngeal  Result Value Ref Range Status   MRSA by PCR NEGATIVE NEGATIVE Final    Comment:        The GeneXpert MRSA Assay (FDA approved for NASAL specimens only), is one component of a comprehensive MRSA colonization surveillance program. It is not intended to diagnose MRSA infection nor to guide or monitor treatment for MRSA infections. Performed at [facility], [address] [place], [place] [address]   Culture, Urine     Status: Abnormal   Collection Time: 04/09/19 11:03 AM   Specimen: Urine, Random  Result Value Ref Range Status   Specimen Description URINE, RANDOM   Final   Special Requests   Final    NONE Performed at [facility], [address] [place], [place] [address]    Culture 50,000 COLONIES/mL PSEUDOMONAS AERUGINOSA (A)  Final   Report Status 04/11/2019 FINAL  Final   Organism ID, Bacteria PSEUDOMONAS AERUGINOSA (A)  Final      Susceptibility   Pseudomonas aeruginosa - MIC*    CEFTAZIDIME 4 SENSITIVE Sensitive     CIPROFLOXACIN <=0.25 SENSITIVE Sensitive     GENTAMICIN <=1 SENSITIVE Sensitive     IMIPENEM 2 SENSITIVE Sensitive     PIP/TAZO 16 SENSITIVE Sensitive     CEFEPIME 2 SENSITIVE Sensitive     * 50,000 COLONIES/mL PSEUDOMONAS AERUGINOSA  C difficile quick scan w PCR reflex     Status: None   Collection Time: 04/09/19  1:21 PM   Specimen: STOOL  Result Value Ref Range Status   C Diff antigen NEGATIVE NEGATIVE Final   C Diff toxin NEGATIVE NEGATIVE Final   C Diff interpretation No C. difficile detected.  Final    Comment: Performed at  Hospital Lab, [address] [place], [place] [address]     Discharge Instructions:   Discharge Instructions    Diet Carb Modified   Complete by: As directed    Discharge instructions   Complete by: As directed    Follow up with your primary care provider on 1/7 and check blood work at that time. Follow up with nephrology Dr Patel on 1/15. You have been prescribed torsemide - water pill. Keep the foley catheter until you see your nephrologist.   Increase activity slowly   Complete by: As directed      Allergies as of 04/12/2019      Reactions   Hydrocodone Hives, Itching   Percocet [oxycodone-acetaminophen] Hives, Itching      Medication List    STOP taking these medications   amLODipine 2.5 MG tablet Commonly known as: NORVASC     TAKE these medications   acetaminophen 500 MG tablet Commonly known as: TYLENOL Take 500-1,000 mg by mouth every 6 (six) hours as needed for headache (pain).   blood glucose meter kit and supplies Kit Dispense based on patient and  insurance preference. Use up to four times daily as   directed. (FOR ICD-9 250.00, 250.01).   carvedilol 3.125 MG tablet Commonly known as: COREG Take 1 tablet (3.125 mg total) by mouth 2 (two) times daily with a meal.   ciprofloxacin 500 MG tablet Commonly known as: CIPRO Take 1 tablet (500 mg total) by mouth daily for 5 days. Start taking on: April 13, 2019   famotidine 20 MG tablet Commonly known as: PEPCID Take 1 tablet (20 mg total) by mouth daily. Start taking on: April 13, 2019   gabapentin 300 MG capsule Commonly known as: NEURONTIN Take 1 capsule (300 mg total) by mouth at bedtime.   insulin aspart 100 UNIT/ML injection Commonly known as: novoLOG Inject 0-15 Units into the skin 3 (three) times daily with meals. CBG < 70: Eat or drink something sweet and recheck. CBG 70 - 120: 0 units CBG 121 - 150: 2 units CBG 151 - 200: 3 units CBG 201 - 250: 5 units CBG 251 - 300: 8 units CBG 301 - 350: 11 units CBG 351 - 400: 15 units CBG > 400: call MD.   insulin aspart 100 UNIT/ML injection Commonly known as: novoLOG Inject 4 Units into the skin 3 (three) times daily with meals.   Insulin Syringes (Disposable) U-100 0.5 ML Misc Use as per instructions 3 times daily AC.   isosorbide mononitrate 30 MG 24 hr tablet Commonly known as: IMDUR Take 1 tablet (30 mg total) by mouth daily. Start taking on: April 13, 2019   loperamide 2 MG capsule Commonly known as: IMODIUM Take 1 capsule (2 mg total) by mouth as needed for diarrhea or loose stools.   predniSONE 20 MG tablet Commonly known as: DELTASONE Take 1 tablet (20 mg total) by mouth daily with breakfast. Start taking on: April 13, 2019 What changed:   medication strength  how much to take  how to take this  when to take this  additional instructions   tamsulosin 0.4 MG Caps capsule Commonly known as: FLOMAX Take 1 capsule (0.4 mg total) by mouth 2 (two) times daily.   thiamine 100 MG tablet Take 1  tablet (100 mg total) by mouth daily.   torsemide 20 MG tablet Commonly known as: Demadex Take 2 tablets (40 mg total) by mouth daily.      Follow-up Information    Leeanne Rio, MD Follow up.   Specialty: Family Medicine Why: Jan 7th appointment scheduled.  Contact information: [place] [address] [phone]            Time coordinating discharge: 39 minutes  Signed:  Contina Strain  [facility] 04/12/2019, 11:07 AM

## 2019-04-12 NOTE — Progress Notes (Signed)
[facility] Progress Note    Assessment/ Plan:   1.  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  Still has Foley.  Repeat Renal US 1/2 with improved hydro.  Increased prednisone back to 20 mg daily as well and plan for a slower taper -- Maintain 20mg  daily until he sees Dr. Posey Pronto on 1/15. Avoid PPIs and cephalosporin antibiotics if possible at this time--> unclear etiology of AIN, had received ceftriaxone 12/6 and doesn't look like he had any quinolones that I can see.    2.  UTI: > 100,000 cfus Pseudomonas, have started cipro.  3.  Obstructive uropathy: detected on last admission, urology c/s, had failed a voiding trial previously and has OP followup with urology.  Flomax 0.4 mg BID  4.  Anasarca: Robust UOP without diuretic yesterday. Improving but still with signficant edema.  Send home with torsemide 40 daily with close f/u -- will have labs checked 1/7 at primary provider.   5.  HTN: home meds per primary  6.  Anemia: hgb 10.4-->8.3 --> 8.6 --> 8.7, send FOBT (needs to be collected) and pt on Pepcid.  Can be followed outpt.   Dispo:  OK to discharge.  Has f/u with internal medicine 1/7 - should have BMP checked and diuretic dose may need to be held if over diuresing.  Has f/u with Dr. Posey Pronto at [facility] 1/15.    Subjective:    4.2 L UOP yesterday.  Feels ok, no uremic symptoms.  Ambulating in halls. Cr 4.2 > 4.3 this AM.     Objective:   BP (!) 146/82 (BP Location: Left Arm)   Pulse 90   Temp 98.7 F (37.1 C) (Oral)   Resp 18   Ht 6\' 3"  (1.905 m)   Wt 101.1 kg   SpO2 100%   BMI 27.86 kg/m   Intake/Output Summary (Last 24 hours) at 04/12/2019 0828 Last data filed at 04/12/2019 1607 Gross per 24 hour  Intake 960 ml  Output 4650 ml  Net -3690 ml   Weight change:   Physical Exam: GEN NAD. Sitting in bed HEENT EOMI PERRL NECK NO JVD PULM clear bilaterally CV RRR ABD soft, nontender EXT 2+ pitting edema to the knees NEURO AAO x 3 no  asterixis SKIN no rashes MSK no effusions Imaging: No results found.  Labs: BMET Recent Labs  Lab 04/08/19 1621 04/09/19 0204 04/10/19 0258 04/11/19 0244 04/12/19 0306  NA 134* 135 135 134* 135  K 5.4* 4.9 5.1 4.7 5.2*  CL 103 103 103 103 102  CO2 21* 22 24 21* 23  GLUCOSE 104* 124* 127* 167* 169*  BUN 62* 66* 69* 69* 73*  CREATININE 3.94* 3.96* 4.20* 4.27* 4.34*  CALCIUM 7.5* 7.1* 7.2* 7.0* 7.1*  PHOS  --  6.3* 5.3*  --  4.1   CBC Recent Labs  Lab 04/08/19 1621 04/10/19 0258 04/11/19 0244 04/12/19 0306  WBC 11.7* 7.1 7.8 7.8  NEUTROABS 10.1*  --   --   --   HGB 10.2* 8.3* 8.6* 8.7*  HCT 32.3* 25.5* 26.5* 27.1*  MCV 103.2* 99.6 98.9 102.7*  PLT 224 164 155 146*    Medications:    . carvedilol  3.125 mg Oral BID WC  . Chlorhexidine Gluconate Cloth  6 each Topical Daily  . ciprofloxacin  500 mg Oral Daily  . famotidine  20 mg Oral Daily  . gabapentin  300 mg Oral QHS  . heparin  5,000 Units Subcutaneous Q8H  .  insulin aspart  0-9 Units Subcutaneous TID WC  . isosorbide mononitrate  30 mg Oral Daily  . predniSONE  20 mg Oral Q breakfast  . tamsulosin  0.4 mg Oral BID

## 2019-04-12 NOTE — Progress Notes (Signed)
Pt being discharged home with self care. Education and discharge information provided to patient. IV's removed. CCMD notified. All belongings with patient. Pt leaving unit via wheelchair.

## 2019-04-14 ENCOUNTER — Other Ambulatory Visit: Payer: BC Managed Care – PPO

## 2019-04-14 ENCOUNTER — Other Ambulatory Visit: Payer: Self-pay

## 2019-04-14 ENCOUNTER — Ambulatory Visit (INDEPENDENT_AMBULATORY_CARE_PROVIDER_SITE_OTHER): Payer: BC Managed Care – PPO | Admitting: Family Medicine

## 2019-04-14 VITALS — BP 160/70 | HR 84 | Wt 216.2 lb

## 2019-04-14 DIAGNOSIS — E1129 Type 2 diabetes mellitus with other diabetic kidney complication: Secondary | ICD-10-CM

## 2019-04-14 DIAGNOSIS — Z23 Encounter for immunization: Secondary | ICD-10-CM | POA: Diagnosis not present

## 2019-04-14 DIAGNOSIS — N179 Acute kidney failure, unspecified: Secondary | ICD-10-CM | POA: Diagnosis not present

## 2019-04-14 DIAGNOSIS — N2889 Other specified disorders of kidney and ureter: Secondary | ICD-10-CM

## 2019-04-14 DIAGNOSIS — F1021 Alcohol dependence, in remission: Secondary | ICD-10-CM

## 2019-04-14 DIAGNOSIS — Z720 Tobacco use: Secondary | ICD-10-CM

## 2019-04-14 DIAGNOSIS — R339 Retention of urine, unspecified: Secondary | ICD-10-CM | POA: Diagnosis not present

## 2019-04-14 DIAGNOSIS — Z1211 Encounter for screening for malignant neoplasm of colon: Secondary | ICD-10-CM

## 2019-04-14 DIAGNOSIS — I10 Essential (primary) hypertension: Secondary | ICD-10-CM | POA: Diagnosis not present

## 2019-04-14 MED ORDER — CARVEDILOL 6.25 MG PO TABS: 6.2500 mg | ORAL_TABLET | Freq: Two times a day (BID) | ORAL | 1 refills | Status: DC

## 2019-04-14 NOTE — Patient Instructions (Addendum)
It was nice to meet you today!  Referring to urologist, to see if the catheter can come out  Referring to GI doctor for your colonoscopy  Increase carvedilol to 6.25mg  twice daily for blood pressure  Pneumonia shot today  Follow up with kidney doctor as scheduled  See me in 3-4 weeks  Be well, Dr. Ardelia Mems

## 2019-04-14 NOTE — Progress Notes (Signed)
HPI:  Patient presents today for a new patient appointment to establish general primary care and to follow up after recent hospital admissions. His wife Suanne Marker is one of my long-term patients.  Prior PCP: Patient cannot remember his last PCP, was last seen by providers in 2019 presumably at an urgent care facility  Patient recently hospitalized from 12/6 to 12/21 with AKI, lower extremity rash, acute urinary retention.   AKI - Underwent renal biopsy which suggested acute tubular interstitial nephritis as well as early/mild diabetic kidney disease. nephro consulted in hospital. Treated with steroids. Remains on prednisone 20mg  daily until he follows up with nephro. Has appointment with neprho on 1/15.  Rash thought to be due to vasculitic process, improved with steroids.  Anasarca - Following discharge 12/21 patient noted increased swelling of hands and feet which led to another hospitalization from 1/1 to 1/5. Treated with IV lasix and diuresed well. Discharged on torsemide 40mg  daily.  Today, patient has lower extremity swelling in his legs and feet bilaterally. Swelling same as on discharge from hospital, not better or worse. Empties foley bag regularly and is diligent about charting urine output. Put out 5L of urine in the last 24 hours.   Urinary retention - thought due to BPH. Has foley in place. Does not have follow up yet scheduled with urology. On flomax 0.4mg  twice daily. During second hospitalization was treated for pseudomonal UTI, still finishing course of cipro for that.  Hypertension - Newly diagnosed hypertension during hospitalization. Discharged with Imdur 30mg  daily and Coreg 3.125mg  twice daily. Blood pressure elevated during visit with a BP of 160/70.  Diabetes - was not on medications prior to recent hospitalization. Sugars have been running high while he has been on prednisone. Being treated with short acting mealtime sliding scale. Has been charting sugars and complaint with  insulin treatment.  Having diarrhea since hospitalization. Had c diff checked in hospital and was negative. No blood in stool. Sometimes doesn't make to bathroom on time. Not getting better, not getting worse. Taking immodium without significant relief.  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 Wife says she "has her husband back" - drugs: none   Allergies: hydrocodone, oxycodone - hives and itching   Health Maintenance:  - Flu shot received in 02/2019  PHYSICAL EXAM: BP (!) 160/70   Pulse 84   Wt 216 lb 3.2 oz (98.1 kg)   SpO2 100%   BMI 27.02 kg/m  Gen: well appearing, conversant HEENT:moist mucus membranes  Heart: RRR, normal S1, S2, no murmurs/rubs Lungs: CTAB, normal work of breathing Abdomen: Soft abdomen, non-tender, non-distended Neuro: A/O x 3 Extremities: 3+ pitting edema to mid shin bilateral lower extremities. No rash, redness, or warmth. Psych: normal range of affect, well groomed, speech normal in rate and volume, normal eye contact  ASSESSMENT/PLAN:  Health maintenance:  - patient received pneumococcal vaccination 23 today - referred to GI for routine screening colonoscopy   Acute renal failure (ARF) ([place]) AKI thought to be multifactorial - acuute tubular interstitial nephritis, mild diabetic renal  disease, as well as obstructive uropathy. Continues to have brisk urine output on current dose of torsemide. Still has significant lower extremity edema but volume status appears stable since discharge.  - has follow up in place with nephro on 1/15 - continue prednisone 20mg  daily  - Update CMET and CBC today.  - continue current dose of torsemide 40mg  daily - referral to urology for management of urinary retention (foley in place, draining well) - follow up with me in 2-3 wks  Tobacco use Not interested in quitting smoking. Continue to address at future visits.  Hypertension Uncontrolled. Increase coreg to 6.25mg  twice daily. Follow up in 2-3 wks  Diabetes mellitus ([place]) Elevated sugars secondary to prednisone therapy. Doing well with short acting insulin sliding scale. Will wait to see what nephro plan is after his visit there next week before deciding whether to switch to long acting insulin vs another agent.  Urinary retention Continues to have foley in place with good output. Refer to urology for possible voiding trial. Complete course of cipro for pseudomonas UTI.  Alcohol dependence ([place]) Doing well, has not had ETOH since hospitalization.   Right renal mass Noted on imaging in hospital. Will plan follow up renal u/s in 6 months (June 2021).  FOLLOW UP: Follow up in 3-4 weeks with me for ongoing care coordination Referral to urology Keep appointment with nephro Referral to GI for routine colonoscopy  Maudry Diego, [facility]  Patient seen along with MS3 student Maudry Diego. I personally evaluated this patient along with the student, and verified all aspects of the history, physical exam, and medical decision making as documented by the student. I agree with the student's documentation and have made all necessary edits.  Chrisandra Netters, MD [place]

## 2019-04-15 ENCOUNTER — Encounter: Payer: Self-pay | Admitting: Family Medicine

## 2019-04-15 DIAGNOSIS — R339 Retention of urine, unspecified: Secondary | ICD-10-CM | POA: Insufficient documentation

## 2019-04-15 DIAGNOSIS — N2889 Other specified disorders of kidney and ureter: Secondary | ICD-10-CM | POA: Insufficient documentation

## 2019-04-15 DIAGNOSIS — F1020 Alcohol dependence, uncomplicated: Secondary | ICD-10-CM | POA: Insufficient documentation

## 2019-04-15 LAB — CMP14+EGFR
ALT: 15 IU/L (ref 0–44)
AST: 12 IU/L (ref 0–40)
Albumin/Globulin Ratio: 1.4 (ref 1.2–2.2)
Albumin: 3.9 g/dL (ref 3.8–4.8)
Alkaline Phosphatase: 68 IU/L (ref 39–117)
BUN/Creatinine Ratio: 18 (ref 10–24)
BUN: 79 mg/dL (ref 8–27)
Bilirubin Total: 0.3 mg/dL (ref 0.0–1.2)
CO2: 21 mmol/L (ref 20–29)
Calcium: 8.3 mg/dL — ABNORMAL LOW (ref 8.6–10.2)
Chloride: 100 mmol/L (ref 96–106)
Creatinine, Ser: 4.41 mg/dL — ABNORMAL HIGH (ref 0.76–1.27)
GFR calc Af Amer: 15 mL/min/{1.73_m2} — ABNORMAL LOW (ref >59)
GFR calc non Af Amer: 13 mL/min/{1.73_m2} — ABNORMAL LOW (ref >59)
Globulin, Total: 2.7 g/dL (ref 1.5–4.5)
Glucose: 109 mg/dL — ABNORMAL HIGH (ref 65–99)
Potassium: 4.9 mmol/L (ref 3.5–5.2)
Sodium: 138 mmol/L (ref 134–144)
Total Protein: 6.6 g/dL (ref 6.0–8.5)

## 2019-04-15 LAB — CBC
Hematocrit: 31.3 % — ABNORMAL LOW (ref 37.5–51.0)
Hemoglobin: 10.5 g/dL — ABNORMAL LOW (ref 13.0–17.7)
MCH: 32.2 pg (ref 26.6–33.0)
MCHC: 33.5 g/dL (ref 31.5–35.7)
MCV: 96 fL (ref 79–97)
Platelets: 188 10*3/uL (ref 150–450)
RBC: 3.26 x10E6/uL — ABNORMAL LOW (ref 4.14–5.80)
RDW: 13.4 % (ref 11.6–15.4)
WBC: 11.3 10*3/uL — ABNORMAL HIGH (ref 3.4–10.8)

## 2019-04-15 NOTE — Assessment & Plan Note (Signed)
Not interested in quitting smoking. Continue to address at future visits.

## 2019-04-15 NOTE — Assessment & Plan Note (Signed)
AKI thought to be multifactorial - acuute tubular interstitial nephritis, mild diabetic renal disease, as well as obstructive uropathy. Continues to have brisk urine output on current dose of torsemide. Still has significant lower extremity edema but volume status appears stable since discharge.  - has follow up in place with nephro on 1/15 - continue prednisone 20mg  daily  - Update CMET and CBC today.  - continue current dose of torsemide 40mg  daily - referral to urology for management of urinary retention (foley in place, draining well) - follow up with me in 2-3 wks

## 2019-04-15 NOTE — Assessment & Plan Note (Signed)
Elevated sugars secondary to prednisone therapy. Doing well with short acting insulin sliding scale. Will wait to see what nephro plan is after his visit there next week before deciding whether to switch to long acting insulin vs another agent.

## 2019-04-15 NOTE — Assessment & Plan Note (Signed)
Noted on imaging in hospital. Will plan follow up renal u/s in 6 months (June 2021).

## 2019-04-15 NOTE — Assessment & Plan Note (Signed)
Doing well, has not had ETOH since hospitalization.

## 2019-04-15 NOTE — Assessment & Plan Note (Signed)
Uncontrolled. Increase coreg to 6.25mg  twice daily. Follow up in 2-3 wks

## 2019-04-15 NOTE — Assessment & Plan Note (Addendum)
Continues to have foley in place with good output. Refer to urology for possible voiding trial. Complete course of cipro for pseudomonas UTI.

## 2019-04-18 ENCOUNTER — Encounter: Payer: Self-pay | Admitting: Family Medicine

## 2019-04-22 ENCOUNTER — Encounter: Payer: Self-pay | Admitting: Gastroenterology

## 2019-05-13 ENCOUNTER — Telehealth: Payer: Self-pay | Admitting: *Deleted

## 2019-05-13 NOTE — Telephone Encounter (Signed)
LVM to call office to go over screening questions prior to visit on Monday. Louis Butler, CMA

## 2019-05-16 ENCOUNTER — Ambulatory Visit: Payer: BC Managed Care – PPO | Admitting: Family Medicine

## 2019-05-16 ENCOUNTER — Encounter: Payer: Self-pay | Admitting: Family Medicine

## 2019-05-16 ENCOUNTER — Other Ambulatory Visit: Payer: Self-pay

## 2019-05-16 DIAGNOSIS — I10 Essential (primary) hypertension: Secondary | ICD-10-CM

## 2019-05-16 DIAGNOSIS — F1021 Alcohol dependence, in remission: Secondary | ICD-10-CM

## 2019-05-16 DIAGNOSIS — E1129 Type 2 diabetes mellitus with other diabetic kidney complication: Secondary | ICD-10-CM | POA: Diagnosis not present

## 2019-05-16 DIAGNOSIS — R339 Retention of urine, unspecified: Secondary | ICD-10-CM | POA: Diagnosis not present

## 2019-05-16 DIAGNOSIS — E1140 Type 2 diabetes mellitus with diabetic neuropathy, unspecified: Secondary | ICD-10-CM | POA: Insufficient documentation

## 2019-05-16 DIAGNOSIS — E1142 Type 2 diabetes mellitus with diabetic polyneuropathy: Secondary | ICD-10-CM

## 2019-05-16 DIAGNOSIS — N179 Acute kidney failure, unspecified: Secondary | ICD-10-CM

## 2019-05-16 MED ORDER — INSULIN GLARGINE 100 UNIT/ML ~~LOC~~ SOLN: 10.0000 [IU] | SUBCUTANEOUS | 1 refills | Status: DC

## 2019-05-16 NOTE — Assessment & Plan Note (Signed)
Diminished sensation of bilateral feet during monofilament exam today. Counseled on foot care and checking his feet daily. Refer to podiatry for calloused lesion on back of his left heel, and for general foot care.

## 2019-05-16 NOTE — Patient Instructions (Addendum)
Stop current insulin Change to lantus 10 units per day Check sugar every day & write down on chart  Schedule eye exam Follow up in 1 month Call with what medications you are on Look at feet every day Ask Dr. Serita Grit office to send me records  Be well, Dr. Ardelia Mems

## 2019-05-16 NOTE — Addendum Note (Signed)
Addended by: Leeanne Rio on: 05/16/2019 06:24 PM   Modules accepted: Orders

## 2019-05-16 NOTE — Assessment & Plan Note (Addendum)
Last hemoglobin A1c in December was under 6.5, though this was in the setting of fairly severe AKI so unclear how reliable A1c is. Sugars are still running high despite being off prednisone for the last week, and patient continues on short acting sliding scale. Will stop short acting insulin and add lantus 10u per day to minimize his injection burden. Counseled on management of hypoglycemia should he develop this. Printed new blood sugar log for patient. Follow up in 1 month.

## 2019-05-16 NOTE — Assessment & Plan Note (Signed)
Improved, continue current regimen. Wife to call back with exact names of his medications to be sure we have them correctly reconciled.

## 2019-05-16 NOTE — Assessment & Plan Note (Addendum)
Remains abstinent of alcohol per wife.

## 2019-05-16 NOTE — Progress Notes (Signed)
  Date of Visit: 05/16/2019   HPI:  Louis Butler presents today for follow up. Accompanied by his wife Louis Butler.  Urinary retention - recently saw urology Dr. Matilde Sprang. Has appointment for urodynamics on 2/17. Continues to have foley catheter in place. Regularly putting out over 3L of urine per day.  AKI/CKD - saw Dr. Posey Pronto neprhology on 1/15 (no records available to me from that visit). Prednisone was tapered down at that visit, per patient. He is no longer on prednisone. Has another appointment scheduled there on 2/22 and is getting labs checked intermittently until then. It is unclear if he is taking torsemide. He thinks he ran out of it. Did not bring medications with him to this visit.   Diabetes - remains on sliding scale insulin only. Finished prednisone about a week ago. Averaging 10-15 units of short acting insulin per day recently while off prednisone. Sugars are running in the 170s/200s fasting in AM. No low sugars.  PHYSICAL EXAM: BP (!) 148/80   Pulse 64   Wt 209 lb 6.4 oz (95 kg)   SpO2 98%   BMI 26.17 kg/m  Gen: no acute distress, pleasant, cooperative HEENT: normocephalic, atraumatic  Lungs: normal work of breathing  Neuro: alert, speech normal Ext: 3+ pitting edema bilateral lower extremities. 2+ pedal pulses bilaterally. Diminished sensation with monofilament over bilateral plantar feet. Hypertrophic callous present over left posterior heel.  ASSESSMENT/PLAN:  Health maintenance:  -advised to schedule eye exam -foot exam today, refer to podiatry -has appointment with GI for colonoscopy consultation  Diabetes mellitus ([place]) Last hemoglobin A1c in December was under 6.5, though this was in the setting of fairly severe AKI so unclear how reliable A1c is. Sugars are still running high despite being off prednisone for the last week, and patient continues on short acting sliding scale. Will stop short acting insulin and add lantus 10u per day to minimize his injection burden.  Counseled on management of hypoglycemia should he develop this. Printed new blood sugar log for patient. Follow up in 1 month.  Hypertension Improved, continue current regimen. Wife to call back with exact names of his medications to be sure we have them correctly reconciled.  Urinary retention Following with urology, continues to have foley in place.   Alcohol dependence ([place]) Remains abstinent of alcohol per wife.  Acute renal failure (ARF) ([place]) Unclear if he is on torsemide. Will await phone call from wife with medication list. Continue follow up with nephrology as scheduled.  Diabetic neuropathy ([facility]) Diminished sensation of bilateral feet during monofilament exam today. Counseled on foot care and checking his feet daily. Refer to podiatry for calloused lesion on back of his left heel, and for general foot care.  FOLLOW UP: Follow up in 1 mo for above issues Referring to podiatry  [place] J. Ardelia Mems, [place]

## 2019-05-16 NOTE — Assessment & Plan Note (Signed)
Following with urology, continues to have foley in place.

## 2019-05-16 NOTE — Assessment & Plan Note (Signed)
Unclear if he is on torsemide. Will await phone call from wife with medication list. Continue follow up with nephrology as scheduled.

## 2019-05-17 ENCOUNTER — Encounter (HOSPITAL_COMMUNITY): Payer: Self-pay

## 2019-05-18 ENCOUNTER — Telehealth: Payer: Self-pay | Admitting: *Deleted

## 2019-05-18 MED ORDER — INSULIN DETEMIR 100 UNIT/ML ~~LOC~~ SOLN: 10.0000 [IU] | Freq: Every day | SUBCUTANEOUS | 1 refills | Status: DC

## 2019-05-18 NOTE — Telephone Encounter (Signed)
Pts wife informed. Christen Bame, CMA

## 2019-05-18 NOTE — Telephone Encounter (Signed)
Lantus is not covered by insurance, please see below for alternative and let "RN Team" know if you want to pursue a PA.       Cover My Meds info: Key: [id number]  Christen Bame, CMA

## 2019-05-18 NOTE — Telephone Encounter (Signed)
Rx changed to levemir. Please let patient or his wife know.  Thanks Leeanne Rio, MD

## 2019-05-20 ENCOUNTER — Encounter: Payer: Self-pay | Admitting: Gastroenterology

## 2019-05-20 ENCOUNTER — Other Ambulatory Visit: Payer: Self-pay

## 2019-05-20 ENCOUNTER — Telehealth: Payer: Self-pay | Admitting: Gastroenterology

## 2019-05-20 ENCOUNTER — Ambulatory Visit: Payer: BC Managed Care – PPO | Admitting: Gastroenterology

## 2019-05-20 ENCOUNTER — Other Ambulatory Visit (INDEPENDENT_AMBULATORY_CARE_PROVIDER_SITE_OTHER): Payer: BC Managed Care – PPO

## 2019-05-20 VITALS — BP 140/70 | HR 62 | Temp 98.4°F | Ht 75.0 in | Wt 210.0 lb

## 2019-05-20 DIAGNOSIS — Z1211 Encounter for screening for malignant neoplasm of colon: Secondary | ICD-10-CM

## 2019-05-20 DIAGNOSIS — R195 Other fecal abnormalities: Secondary | ICD-10-CM | POA: Diagnosis not present

## 2019-05-20 DIAGNOSIS — K8020 Calculus of gallbladder without cholecystitis without obstruction: Secondary | ICD-10-CM

## 2019-05-20 DIAGNOSIS — K30 Functional dyspepsia: Secondary | ICD-10-CM | POA: Diagnosis not present

## 2019-05-20 DIAGNOSIS — Z01818 Encounter for other preprocedural examination: Secondary | ICD-10-CM

## 2019-05-20 LAB — HIGH SENSITIVITY CRP: CRP, High Sensitivity: 26.14 mg/L — ABNORMAL HIGH (ref 0.000–5.000)

## 2019-05-20 LAB — IGA: IgA: 456 mg/dL — ABNORMAL HIGH (ref 68–378)

## 2019-05-20 LAB — SEDIMENTATION RATE: Sed Rate: 64 mm/hr — ABNORMAL HIGH (ref 0–20)

## 2019-05-20 MED ORDER — ESOMEPRAZOLE MAGNESIUM 20 MG PO CPDR: 20.0000 mg | DELAYED_RELEASE_CAPSULE | Freq: Every day | ORAL | 2 refills | Status: DC

## 2019-05-20 MED ORDER — SUPREP BOWEL PREP KIT 17.5-3.13-1.6 GM/177ML PO SOLN: 1.0000 | ORAL | 0 refills | Status: DC

## 2019-05-20 NOTE — Progress Notes (Signed)
[facility] VISIT   Primary Care Provider Leeanne Rio, [place] [address] [phone]  Referring Provider [place] [employer] [address],  [place] [address] [phone]  Patient Profile: Louis Butler is a 64 y.o. male with a pmh significant for diabetes, hypertension, acute on chronic renal insufficiency, hypertension, urinary retention, alcohol use disorder, tobacco use disorder.  The patient presents to the [facility] for an evaluation and management of problem(s) noted below:  Problem List 1. Loose bowel movements   2. Indigestion   3. Colon cancer screening   4. Calculus of gallbladder without cholecystitis without obstruction   5. Preop examination     History of Present Illness This is the patient's first visit to the outpatient [place] clinic.  The patient was referred for direct procedure colonoscopy for colon cancer screening however due to patient's recent hospitalizations with aggressive renal insufficiency he was deemed necessary to have a clinic visit.  The patient has never had colon cancer screening.  He does describe however course of the last 3 to 4 months having experienced a change in his bowel habits.  The caliber of his stool has changed.  He will have what he describes as diarrhea 1-3 times daily.  No blood in the stools.  He is not clear whether this is medication induced changes or if this is a result of other things that are going on in his body.  He has experienced issues with indigestion and recurrent nausea at times.  He has been prescribed loperamide but is not taking it appropriately in an effort of trying to decrease his bowel habits.  He has only had a C. difficile checked in January but no other significant stool studies.  He describes very infrequent acid reflux.  No other significant GI symptoms.  He still has a urinary catheter due  to issues of urinary retention.  He is awaiting follow-up with [facility] who have been following his labs recently.  The patient recalls an upper endoscopy in the late 80s or early 90s but has never had a colonoscopy.  He does not take significant nonsteroidals or BC/Goody powders.  GI Review of Systems Positive as above Negative for abdominal pain, vomiting, bloating, melena, hematochezia, urgency, nocturnal bowel movements  Review of Systems General: Denies fevers/chills/weight loss HEENT: Denies oral lesions Cardiovascular: Denies chest pain Pulmonary: Denies shortness of breath Gastroenterological: See HPI Genitourinary: Denies darkened urine Hematological: Denies easy bruising/bleeding Endocrine: Denies temperature intolerance Dermatological: Denies jaundice Psychological: Mood is anxious to get his urinary catheter removed   Medications Current Outpatient Medications  Medication Sig Dispense Refill  . acetaminophen (TYLENOL) 500 MG tablet Take 500-1,000 mg by mouth every 6 (six) hours as needed for headache (pain).    . blood glucose meter kit and supplies KIT Dispense based on patient and insurance preference. Use up to four times daily as directed. (FOR ICD-9 250.00, 250.01). 1 each 0  . carvedilol (COREG) 6.25 MG tablet Take 1 tablet (6.25 mg total) by mouth 2 (two) times daily with a meal. 60 tablet 1  . famotidine (PEPCID) 20 MG tablet Take 1 tablet (20 mg total) by mouth daily. 30 tablet 0  . furosemide (LASIX) 80 MG tablet Take 80 mg by mouth 2 (two) times daily.    Marland Kitchen gabapentin (NEURONTIN) 300 MG capsule Take 1 capsule (300 mg total) by mouth at bedtime. 30 capsule 2  . insulin  detemir (LEVEMIR) 100 UNIT/ML injection Inject 0.1 mLs (10 Units total) into the skin daily. 10 mL 1  . Insulin Syringes, Disposable, U-100 0.5 ML MISC Use as per instructions 3 times daily AC. 100 each 1  . loperamide (IMODIUM) 2 MG capsule Take 1 capsule (2 mg total) by mouth as  needed for diarrhea or loose stools. 30 capsule 0  . esomeprazole (NEXIUM) 20 MG capsule Take 1 capsule (20 mg total) by mouth daily. 30 capsule 2  . isosorbide mononitrate (IMDUR) 30 MG 24 hr tablet Take 1 tablet (30 mg total) by mouth daily. 30 tablet 2  . Na Sulfate-K Sulfate-Mg Sulf (SUPREP BOWEL PREP KIT) 17.5-3.13-1.6 GM/177ML SOLN Take 1 kit by mouth as directed. For colonoscopy prep 354 mL 0   No current facility-administered medications for this visit.    Allergies Allergies  Allergen Reactions  . Hydrocodone Hives and Itching  . Percocet [Oxycodone-Acetaminophen] Hives and Itching    Histories Past Medical History:  Diagnosis Date  . ARF (acute renal failure) ([place]) 03/14/2019  . Cellulitis 03/14/2019   feet  . Diabetes mellitus without complication ([place])   . Hypertension   . Irregular heart beat   . UTI (urinary tract infection)    Past Surgical History:  Procedure Laterality Date  . arm surgery Right    was cut to the bone  . WISDOM TOOTH EXTRACTION     Social History   Socioeconomic History  . Marital status: Married    Spouse name: Suanne Marker  . Number of children: 3  . Years of education: Not on file  . Highest education level: Not on file  Occupational History  . Occupation: Investment banker, operational [employer]  Tobacco Use  . Smoking status: Current Every Day Smoker    Packs/day: 1.00    Types: Cigarettes  . Smokeless tobacco: Never Used  Substance and Sexual Activity  . Alcohol use: Not Currently    Comment: 12 pack a week, couple beers a day  . Drug use: No  . Sexual activity: Not on file  Other Topics Concern  . Not on file  Social History Narrative  . Not on file   Social Determinants of Health   Financial Resource Strain:   . Difficulty of Paying Living Expenses: Not on file  Food Insecurity:   . Worried About Charity fundraiser in the Last Year: Not on file  . Ran Out of Food in the Last Year: Not on file  Transportation Needs:   .  Lack of Transportation (Medical): Not on file  . Lack of Transportation (Non-Medical): Not on file  Physical Activity:   . Days of Exercise per Week: Not on file  . Minutes of Exercise per Session: Not on file  Stress:   . Feeling of Stress : Not on file  Social Connections:   . Frequency of Communication with Friends and Family: Not on file  . Frequency of Social Gatherings with Friends and Family: Not on file  . Attends Religious Services: Not on file  . Active Member of Clubs or Organizations: Not on file  . Attends Archivist Meetings: Not on file  . Marital Status: Not on file  Intimate Partner Violence:   . Fear of Current or Ex-Partner: Not on file  . Emotionally Abused: Not on file  . Physically Abused: Not on file  . Sexually Abused: Not on file   Family History  Problem Relation Age of Onset  . Diabetes type II  Mother   . Lung cancer Mother   . Hypertension Mother   . Diabetes type II Father   . Diabetes type II Sister   . Diabetes type II Brother   . Hypertension Brother   . Other Son        burned  . CAD Neg Hx   . Colon cancer Neg Hx   . Esophageal cancer Neg Hx   . Inflammatory bowel disease Neg Hx   . Liver disease Neg Hx   . Pancreatic cancer Neg Hx   . Rectal cancer Neg Hx   . Stomach cancer Neg Hx    I have reviewed his medical, social, and family history in detail and updated the electronic medical record as necessary.    PHYSICAL EXAMINATION  BP 140/70   Pulse 62   Temp 98.4 F (36.9 C)   Ht 6' 3"  (1.905 m)   Wt 210 lb (95.3 kg)   BMI 26.25 kg/m  Wt Readings from Last 3 Encounters:  05/20/19 210 lb (95.3 kg)  05/16/19 209 lb 6.4 oz (95 kg)  04/14/19 216 lb 3.2 oz (98.1 kg)  GEN: NAD, appears older than stated age, nontoxic, accompanied by wife PSYCH: Cooperative, without pressured speech EYE: Conjunctivae pink, sclerae anicteric ENT: MMM CV: Nontachycardic RESP: No wheezing present GI: NABS, soft, small ventral diastases  noted, NT, without rebound or guarding MSK/EXT: 1+ bilateral pedal edema present SKIN: No jaundice NEURO:  Alert & Oriented x 3, no focal deficits   REVIEW OF DATA  I reviewed the following data at the time of this encounter:  GI Procedures and Studies  No relevant studies to review  Laboratory Studies  Reviewed those in epic Negative for acute hepatitis serologies in 2017 and 2020  Imaging Studies  December 2020 CT abdomen pelvis without contrast IMPRESSION: 1. Bilateral mild hydroureteronephrosis, followed to the level of the urinary bladder. The bladder is thick walled with surrounding pericystic edema in the setting of a Foley catheter. Considerations include cystitis and/or bladder outlet obstruction. Perirenal interstitial thickening is nonspecific but likely related to the clinical history of renal insufficiency. 2. 3 mm mid right ureteric nonobstructive stone. 3. Right renal mass is technically indeterminate on noncontrast exam. Most consistent with a cyst on yesterday's ultrasound. Consider surveillance with renal ultrasound at 6 months and attention to the right renal lesion. 4. Coronary artery atherosclerosis. Aortic Atherosclerosis (ICD10-I70.0). Emphysema (ICD10-J43.9). 5. Cholelithiasis.   ASSESSMENT  Mr. Dimaio is a 64 y.o. male with a pmh significant for diabetes, hypertension, acute on chronic renal insufficiency, hypertension, urinary retention, alcohol use disorder, tobacco use disorder.  The patient is seen today for evaluation and management of:  1. Loose bowel movements   2. Indigestion   3. Colon cancer screening   4. Calculus of gallbladder without cholecystitis without obstruction   5. Preop examination    The patient is hemodynamically stable.  The patient has never undergone colon cancer screening, he will benefit from moving forward with this.  With that being said, he also has been experiencing symptoms of diarrhea for the last few months.  We  need to rule out infectious etiologies although C. difficile was recently ruled out rule out other things with stool studies.  I want to rule out microscopic and collagenous colitis as well as inflammatory bowel disease.  This could be medication induced from gabapentin but he has been on this for quite a while.  Not really taking Imodium appropriately as he is only taking  1 pill daily if needed and does not take it every day.  We will plan a diagnostic colonoscopy for biopsies as well as colon cancer screening.  Upper endoscopy also would be reasonable in the setting of his indigestion and recurrent nausea without PPI therapy.  We will plan to perform that as well.  Tentatively will plan to do these in the endoscopy center as long as his stool studies are negative he is not having a progressive uremia develop.  If things change then could consider transitioning him to the hospital for procedures.  Lower volume Suprep to be given to patient in an effort of trying to minimize significant retention of water.  We will try to get his laboratories from [facility] as well.  We will obtain some laboratories as well as stool studies that will be outlined below.  It is not clear to me that the cholelithiasis that he is having is resulting in any of his symptoms.  The risks and benefits of endoscopic evaluation were discussed with the patient; these include but are not limited to the risk of perforation, infection, bleeding, missed lesions, lack of diagnosis, severe illness requiring hospitalization, as well as anesthesia and sedation related illnesses.  The patient is agreeable to proceed.  All patient and family questions were answered, to the best of my ability, and the patient and family agreed to the aforementioned plan of action with follow-up as indicated.   PLAN  Laboratories as outlined below Stool studies as outlined below Fecal elastase to be obtained Diagnostic endoscopy/colonoscopy to be  pursued We will consider further work-up including bacterial overgrowth evaluation based on work-up Would hold on loperamide unless having more than 3 bowel movements per day and if he does take loperamide should be taken as 4 mg and at first diarrheal movement and subsequently 2 mg every 6 hours or with each bowel movement with no more than 16 mg taken daily   Orders Placed This Encounter  Procedures  . Helicobacter pylori special antigen  . Stool Culture  . Ova and parasite examination  . SARS Coronavirus 2 ([facility] ONLY)  . Sedimentation rate  . CRP High sensitivity  . IgA  . Tissue transglutaminase, IgA  . Clostridium difficile Toxin B, Qualitative, Real-Time PCR  . Pancreatic Elastase, Fecal  . Ambulatory referral to Gastroenterology    New Prescriptions   ESOMEPRAZOLE (NEXIUM) 20 MG CAPSULE    Take 1 capsule (20 mg total) by mouth daily.   NA SULFATE-K SULFATE-MG SULF (SUPREP BOWEL PREP KIT) 17.5-3.13-1.6 GM/177ML SOLN    Take 1 kit by mouth as directed. For colonoscopy prep   Modified Medications   No medications on file    Planned Follow Up No follow-ups on file.   Total Time in Face-to-Face and in Coordination of Care for patient including independent/personal interpretation/review of prior testing, medical history, examination, medication adjustment, communicating results with the patient directly, and documentation with the EHR is greater than 45 minutes.   Justice Britain, MD [facility]oscopy Office # [phone]

## 2019-05-20 NOTE — Patient Instructions (Addendum)
You have been scheduled for an endoscopy and colonoscopy. Please follow the written instructions given to you at your visit today. Please pick up your prep supplies at the pharmacy within the next 1-3 days. If you use inhalers (even only as needed), please bring them with you on the day of your procedure.  Your provider has requested that you go to the basement level for lab work before leaving today. Press "B" on the elevator. The lab is located at the first door on the left as you exit the elevator.  We have sent the following medications to your pharmacy for you to pick up at your convenience: Nexium, Suprep  Do not start Nexium until after you have submitted stool studies.   Thank you for choosing me and [facility].  Dr. Rush Landmark

## 2019-05-20 NOTE — Telephone Encounter (Signed)
[name] wife called and stated that she cannot afford Suprep at $137.  Pt is scheduled for an EGD/COL 06/28/19.

## 2019-05-21 ENCOUNTER — Encounter: Payer: Self-pay | Admitting: Gastroenterology

## 2019-05-21 DIAGNOSIS — R195 Other fecal abnormalities: Secondary | ICD-10-CM | POA: Insufficient documentation

## 2019-05-21 DIAGNOSIS — K8020 Calculus of gallbladder without cholecystitis without obstruction: Secondary | ICD-10-CM | POA: Insufficient documentation

## 2019-05-21 DIAGNOSIS — Z01818 Encounter for other preprocedural examination: Secondary | ICD-10-CM | POA: Insufficient documentation

## 2019-05-21 DIAGNOSIS — K30 Functional dyspepsia: Secondary | ICD-10-CM | POA: Insufficient documentation

## 2019-05-21 DIAGNOSIS — Z1211 Encounter for screening for malignant neoplasm of colon: Secondary | ICD-10-CM | POA: Insufficient documentation

## 2019-05-23 ENCOUNTER — Telehealth: Payer: Self-pay | Admitting: Family Medicine

## 2019-05-23 LAB — TISSUE TRANSGLUTAMINASE, IGA: (tTG) Ab, IgA: 1 U/mL

## 2019-05-23 NOTE — Telephone Encounter (Signed)
Left message for wife. I am trying to get a sample of the prep for the pt.

## 2019-05-23 NOTE — Telephone Encounter (Signed)
Reviewed Health Care Provider form and placed it in PCP's box for completion.  Louis Butler, [place]

## 2019-05-23 NOTE — Telephone Encounter (Signed)
Pt. Wife walked in requesting a certificate of Health Care Provider form completed. Last appt. 05/16/2019. Form placed in red team folder

## 2019-05-24 NOTE — Telephone Encounter (Signed)
Patients wife is returning your call

## 2019-05-27 NOTE — Telephone Encounter (Signed)
Attempted to reach patient/wife to discuss what he needs FMLA paperwork to say. No answer. Will try to contact next week.  Leeanne Rio, MD

## 2019-05-27 NOTE — Telephone Encounter (Signed)
Patient's wife calling to check status of this paperwork. Did not see paperwork in [facility] box. Please update wife on this process. She also asked that it be faxed to her husbands job at [phone]. Please advise.

## 2019-06-02 NOTE — Telephone Encounter (Signed)
Pts wife called to discuss pts paperwork. Please call and advise. Thanks. [phone]

## 2019-06-02 NOTE — Telephone Encounter (Signed)
Attempted to reach patient again. LVM asking him to call back. Leeanne Rio, MD

## 2019-06-02 NOTE — Telephone Encounter (Signed)
FYI I made two copies (one to fax, one to scan) and put them in the [facility] RN form inbox I placed original at the front desk for them to pick up when they come to sign the ROI  Thanks Leeanne Rio, MD

## 2019-06-02 NOTE — Telephone Encounter (Signed)
Spoke with wife. He has been out of work since 03/13/19 when his illness first began. Dr. Posey Pronto wrote him out of work for another 6 weeks at least. Needs FMLA paperwork completed to reflect this. Wife requests that we fax forms to her husband's job at [phone]. Advised we will need him to sign a release form so we can fax the papers to them.  FMLA papers completed. Will return to [facility] RN team.  Will need to make copy to fax & scan into his chart (after he has signed the release form). Originals can be returned to wife.  Advised wife to give [place] 30 mins to get this ready. She was appreciative.  Thanks Leeanne Rio, MD

## 2019-06-03 NOTE — Telephone Encounter (Signed)
Attached ROI and copy to be faxed to original.  Copy made for batch scanning. Christen Bame, CMA

## 2019-06-06 HISTORY — PX: COLONOSCOPY: SHX174

## 2019-06-06 HISTORY — PX: UPPER GASTROINTESTINAL ENDOSCOPY: SHX188

## 2019-06-10 NOTE — Telephone Encounter (Signed)
Called pt and left message that we have Suprep samples. I left a sample of the prep at front desk for pt to pick up.

## 2019-06-13 ENCOUNTER — Other Ambulatory Visit: Payer: BC Managed Care – PPO

## 2019-06-13 DIAGNOSIS — K30 Functional dyspepsia: Secondary | ICD-10-CM

## 2019-06-13 DIAGNOSIS — Z1211 Encounter for screening for malignant neoplasm of colon: Secondary | ICD-10-CM

## 2019-06-14 ENCOUNTER — Ambulatory Visit (INDEPENDENT_AMBULATORY_CARE_PROVIDER_SITE_OTHER): Payer: BC Managed Care – PPO | Admitting: Family Medicine

## 2019-06-14 ENCOUNTER — Encounter: Payer: Self-pay | Admitting: Family Medicine

## 2019-06-14 ENCOUNTER — Other Ambulatory Visit: Payer: Self-pay

## 2019-06-14 VITALS — BP 140/60 | HR 84 | Wt 202.2 lb

## 2019-06-14 DIAGNOSIS — R339 Retention of urine, unspecified: Secondary | ICD-10-CM | POA: Diagnosis not present

## 2019-06-14 DIAGNOSIS — N179 Acute kidney failure, unspecified: Secondary | ICD-10-CM | POA: Diagnosis not present

## 2019-06-14 DIAGNOSIS — E1142 Type 2 diabetes mellitus with diabetic polyneuropathy: Secondary | ICD-10-CM | POA: Diagnosis not present

## 2019-06-14 DIAGNOSIS — E1129 Type 2 diabetes mellitus with other diabetic kidney complication: Secondary | ICD-10-CM | POA: Diagnosis not present

## 2019-06-14 LAB — POCT GLYCOSYLATED HEMOGLOBIN (HGB A1C): HbA1c, POC (controlled diabetic range): 7.2 % — AB (ref 0.0–7.0)

## 2019-06-14 MED ORDER — HYDROCORTISONE 2.5 % EX CREA: TOPICAL_CREAM | Freq: Two times a day (BID) | CUTANEOUS | 0 refills | Status: DC

## 2019-06-14 MED ORDER — INSULIN DETEMIR 100 UNIT/ML ~~LOC~~ SOLN: 5.0000 [IU] | Freq: Every day | SUBCUTANEOUS | Status: DC

## 2019-06-14 NOTE — Patient Instructions (Addendum)
Checking A1c today  Fill out sugar chart. I recommend you stay on 5 units daily of lantus  Sent in higher strength steroid cream for your neck  Try gabapentin 300mg  at night. Let me know if it's making you too sleepy, we can go down on the dose.  Schedule eye exam  Follow up in 1 month   Be well, Dr. Ardelia Mems

## 2019-06-14 NOTE — Progress Notes (Signed)
  Date of Visit: 06/14/2019   SUBJECTIVE:   HPI:  Waino presents today for follow up.  Urinary retention - still has foley catheter in place. Saw urology office on 2/17 for urodynamics. Has a follow up physician visit scheduled with them tomorrow.   Pain in legs - having some discomfort in his legs/feet, especially at night. Previously was prescribed gabapentin 300mg  nightly but has not taken in some time. Would be willing to try this again.  CKD - saw Dr. Posey Pronto of [facility] back on 2/22. Has follow up visit scheduled with them on 3/29. Was told his renal function is still at 12%. Is now taking lasix 80mg  daily (previously was on 80mg  twice daily). Reports weight down 30lb total since this all began. Evidently they are planning for possible need for HD long term, and may need eventual renal transplant. I do not have records from this visit.  Rash on neck - has had itchy rash on neck since hospitalization. 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 Has used hydrocortisone 1% cream without relief.  Diabetes - taking levemir 5 to 7 u daily. He is faithful about checking sugars three times daily. Previously started 10u of levemir daily, but had a syncopal episode when he did 10u, so has adjusted back down. He doses it between 5 and 7 units depending on how his sugars are running. No low sugars lately.  OBJECTIVE:   BP 140/60   Pulse 84   Wt 202 lb 3.2 oz (91.7 kg)   SpO2 98%   BMI 25.27 kg/m  Gen: no acute distress, pleasant, cooperative, well appearing HEENT: normocephalic, atraumatic  Heart: regular rate and rhythm, no murmur Lungs: clear to auscultation bilaterally  Neuro: alert speech normal, grossly nonfocal Ext: 2+ DP and posterior tibial pulses bilaterally Skin: erythematous papules on bilateral anterior neck overlying hair follicles  ASSESSMENT/PLAN:   Health maintenance:  -has appointment scheduled for colonoscopy -reminded to schedule his eye exam -removed urine  microalbumin from HM modifiers, as he is followed closely by renal and is likely to need HD/transplant. Urine micro would not change current management.  Diabetes mellitus ([place]) Check A1c today to assess control overall He will continue checking sugars three times daily. I have encouraged him to take a consistent dose of insulin, rather than adjusting the dose himself.  Diabetic neuropathy ([place]) Encouraged to take gabapentin 300mg  at night as trial medication, may work better now that legs are less edematous (cannot go higher on dose due to CKD)  Urinary retention Await records from urology office regarding whether foley can be removed. Results of urodynamics pending.  Acute renal failure (ARF) ([place]) Records request sent to [facility] as I don't have office visit notes from them.  Rash on neck Appears eczematous vs dry skin. Increase potency of steroid cream to hydrocortisone 2.5% Follow up if not improving  FOLLOW UP: Follow up in 1 mo for above issues  [place] J. Ardelia Mems, [place]

## 2019-06-15 NOTE — Assessment & Plan Note (Addendum)
Encouraged to take gabapentin 300mg  at night as trial medication, may work better now that legs are less edematous (cannot go higher on dose due to CKD)

## 2019-06-15 NOTE — Assessment & Plan Note (Signed)
Await records from urology office regarding whether foley can be removed. Results of urodynamics pending.

## 2019-06-15 NOTE — Assessment & Plan Note (Signed)
Records request sent to [facility] as I don't have office visit notes from them.

## 2019-06-15 NOTE — Assessment & Plan Note (Signed)
Check A1c today to assess control overall He will continue checking sugars three times daily. I have encouraged him to take a consistent dose of insulin, rather than adjusting the dose himself.

## 2019-06-16 ENCOUNTER — Encounter: Payer: Self-pay | Admitting: Family Medicine

## 2019-06-17 ENCOUNTER — Other Ambulatory Visit: Payer: Self-pay | Admitting: Family Medicine

## 2019-06-20 LAB — HELICOBACTER PYLORI  SPECIAL ANTIGEN
MICRO NUMBER:: 10225414
SPECIMEN QUALITY: ADEQUATE

## 2019-06-20 LAB — STOOL CULTURE
MICRO NUMBER:: 10225516
MICRO NUMBER:: 10225517
MICRO NUMBER:: 10225519
SHIGA RESULT:: NOT DETECTED
SPECIMEN QUALITY:: ADEQUATE
SPECIMEN QUALITY:: ADEQUATE
SPECIMEN QUALITY:: ADEQUATE

## 2019-06-20 LAB — PANCREATIC ELASTASE, FECAL: Pancreatic Elastase-1, Stool: 55 mcg/g — ABNORMAL LOW

## 2019-06-20 LAB — OVA AND PARASITE EXAMINATION
CONCENTRATE RESULT:: NONE SEEN
MICRO NUMBER:: 10225518
SPECIMEN QUALITY:: ADEQUATE
TRICHROME RESULT:: NONE SEEN

## 2019-06-20 LAB — CLOSTRIDIUM DIFFICILE TOXIN B, QUALITATIVE, REAL-TIME PCR: Toxigenic C. Difficile by PCR: NOT DETECTED

## 2019-06-24 ENCOUNTER — Other Ambulatory Visit: Payer: Self-pay

## 2019-06-24 ENCOUNTER — Ambulatory Visit (INDEPENDENT_AMBULATORY_CARE_PROVIDER_SITE_OTHER): Payer: BC Managed Care – PPO

## 2019-06-24 ENCOUNTER — Other Ambulatory Visit: Payer: Self-pay | Admitting: Gastroenterology

## 2019-06-24 DIAGNOSIS — Z1159 Encounter for screening for other viral diseases: Secondary | ICD-10-CM

## 2019-06-25 LAB — SARS CORONAVIRUS 2 (TAT 6-24 HRS): SARS Coronavirus 2: NEGATIVE

## 2019-06-28 ENCOUNTER — Encounter: Payer: Self-pay | Admitting: Gastroenterology

## 2019-06-28 ENCOUNTER — Ambulatory Visit (AMBULATORY_SURGERY_CENTER): Payer: BC Managed Care – PPO | Admitting: Gastroenterology

## 2019-06-28 ENCOUNTER — Other Ambulatory Visit: Payer: Self-pay

## 2019-06-28 VITALS — BP 130/74 | HR 65 | Temp 97.1°F | Resp 14 | Ht 75.0 in | Wt 210.0 lb

## 2019-06-28 DIAGNOSIS — K641 Second degree hemorrhoids: Secondary | ICD-10-CM

## 2019-06-28 DIAGNOSIS — K5730 Diverticulosis of large intestine without perforation or abscess without bleeding: Secondary | ICD-10-CM

## 2019-06-28 DIAGNOSIS — K2950 Unspecified chronic gastritis without bleeding: Secondary | ICD-10-CM | POA: Diagnosis not present

## 2019-06-28 DIAGNOSIS — R197 Diarrhea, unspecified: Secondary | ICD-10-CM

## 2019-06-28 DIAGNOSIS — K30 Functional dyspepsia: Secondary | ICD-10-CM

## 2019-06-28 DIAGNOSIS — K3189 Other diseases of stomach and duodenum: Secondary | ICD-10-CM

## 2019-06-28 DIAGNOSIS — D120 Benign neoplasm of cecum: Secondary | ICD-10-CM

## 2019-06-28 DIAGNOSIS — K259 Gastric ulcer, unspecified as acute or chronic, without hemorrhage or perforation: Secondary | ICD-10-CM

## 2019-06-28 DIAGNOSIS — D122 Benign neoplasm of ascending colon: Secondary | ICD-10-CM | POA: Diagnosis not present

## 2019-06-28 DIAGNOSIS — Z1211 Encounter for screening for malignant neoplasm of colon: Secondary | ICD-10-CM

## 2019-06-28 DIAGNOSIS — D123 Benign neoplasm of transverse colon: Secondary | ICD-10-CM | POA: Diagnosis not present

## 2019-06-28 MED ORDER — ESOMEPRAZOLE MAGNESIUM 40 MG PO CPDR: 40.0000 mg | DELAYED_RELEASE_CAPSULE | Freq: Every day | ORAL | 3 refills | Status: DC

## 2019-06-28 MED ORDER — SODIUM CHLORIDE 0.9 % IV SOLN: 500.0000 mL | Freq: Once | INTRAVENOUS | Status: DC

## 2019-06-28 NOTE — Op Note (Signed)
[name]: Louis Butler Procedure Date: 06/28/2019 3:30 PM MRN: [id number] Endoscopist: Justice Britain , MD Age: 64 Referring MD:  Date of Birth: June 15, 1955 Gender: Male Account #: [id number] Procedure:                Colonoscopy Indications:              Clinically significant diarrhea of unexplained                            origin Medicines:                Monitored Anesthesia Care Procedure:                Pre-Anesthesia Assessment:                           - Prior to the procedure, a History and Physical                            was performed, and patient medications and                            allergies were reviewed. The patient's tolerance of                            previous anesthesia was also reviewed. The risks                            and benefits of the procedure and the sedation                            options and risks were discussed with the patient.                            All questions were answered, and informed consent                            was obtained. Prior Anticoagulants: The patient has                            taken no previous anticoagulant or antiplatelet                            agents. ASA Grade Assessment: II - A patient with                            mild systemic disease. After reviewing the risks                            and benefits, the patient was deemed in                            satisfactory condition to undergo the procedure.  After obtaining informed consent, the colonoscope                            was passed under direct vision. Throughout the                            procedure, the patient's blood pressure, pulse, and                            oxygen saturations were monitored continuously. The                            Colonoscope was introduced through the anus and                            advanced to the 5 cm into the ileum. The              colonoscopy was performed without difficulty. The                            patient tolerated the procedure. The quality of the                            bowel preparation was adequate. The terminal ileum,                            ileocecal valve, appendiceal orifice, and rectum                            were photographed. Scope In: 3:57:59 PM Scope Out: 4:23:56 PM Scope Withdrawal Time: 0 hours 23 minutes 9 seconds  Total Procedure Duration: 0 hours 25 minutes 57 seconds  Findings:                 The digital rectal exam findings include                            hemorrhoids. Pertinent negatives include no                            palpable rectal lesions.                           The terminal ileum and ileocecal valve appeared                            normal. Biopsies were taken with a cold forceps for                            histology.                           Ten sessile polyps were found in the transverse                            colon (4),  ascending colon (5) and on the ileocecal                            valve (1). The polyps were 2 to 10 mm in size.                            These polyps were removed with a cold snare.                            Resection and retrieval were complete.                           A single small angioectasia with typical                            arborization was found in the ascending colon.                           Many small and large-mouthed diverticula were found                            in the recto-sigmoid colon and sigmoid colon.                           Normal mucosa was found in the entire colon.                            Biopsies for histology were taken with a cold                            forceps from the entire colon for evaluation of                            microscopic colitis.                           Non-bleeding non-thrombosed internal hemorrhoids                            were found during  retroflexion, during perianal                            exam and during digital exam. The hemorrhoids were                            Grade II (internal hemorrhoids that prolapse but                            reduce spontaneously). Complications:            No immediate complications. Estimated Blood Loss:     Estimated blood loss was minimal. Impression:               - Hemorrhoids found on digital rectal exam.                           -  The examined portion of the ileum was normal.                            Biopsied.                           - Ten 2 to 10 mm polyps in the transverse colon, in                            the ascending colon and at the ileocecal valve,                            removed with a cold snare. Resected and retrieved.                           - A single colonic angioectasia.                           - Diverticulosis in the recto-sigmoid colon and in                            the sigmoid colon.                           - Normal mucosa in the entire examined colon.                            Biopsied.                           - Non-bleeding non-thrombosed internal hemorrhoids. Recommendation:           - The patient will be observed post-procedure,                            until all discharge criteria are met.                           - Discharge patient to home.                           - Patient has a contact number available for                            emergencies. The signs and symptoms of potential                            delayed complications were discussed with the                            patient. Return to normal activities tomorrow.                            Written discharge instructions were provided to the  patient.                           - High fiber diet.                           - Continue present medications.                           - Await pathology results.                           -  Repeat colonoscopy in 1 year for surveillance                            based on pathology results if all polyps return as                            adenomatous, otherwise a 3-year follow up can be                            considered.                           - Will schedule follow up to further discuss next                            steps in evaluation and potential Pancreatic Enzyme                            Initiation based on recent findings of low fecal                            elastase and if biopsies do not show evidence of                            Microscopic/Collagenous colitis.                           - The findings and recommendations were discussed                            with the patient.                           - The findings and recommendations were discussed                            with the patient's family. Justice Britain, MD 06/28/2019 4:34:51 PM This report has been signed electronically.

## 2019-06-28 NOTE — Progress Notes (Signed)
Called to room to assist during endoscopic procedure.  Patient ID and intended procedure confirmed with present staff. Received instructions for my participation in the procedure from the performing physician.  

## 2019-06-28 NOTE — Progress Notes (Signed)
Temp taken by JB VS taken by DT 

## 2019-06-28 NOTE — Patient Instructions (Signed)
Handouts on polyps, diverticulosis, hemorrhoids, and high fiber diet given to you today  Await pathology results Increase nexium to 40mg  a day    YOU HAD AN ENDOSCOPIC PROCEDURE TODAY AT [facility]:   Refer to the procedure report that was given to you for any specific questions about what was found during the examination.  If the procedure report does not answer your questions, please call your gastroenterologist to clarify.  If you requested that your care partner not be given the details of your procedure findings, then the procedure report has been included in a sealed envelope for you to review at your convenience later.  YOU SHOULD EXPECT: Some feelings of bloating in the abdomen. Passage of more gas than usual.  Walking can help get rid of the air that was put into your GI tract during the procedure and reduce the bloating. If you had a lower endoscopy (such as a colonoscopy or flexible sigmoidoscopy) you may notice spotting of blood in your stool or on the toilet paper. If you underwent a bowel prep for your procedure, you may not have a normal bowel movement for a few days.  Please Note:  You might notice some irritation and congestion in your nose or some drainage.  This is from the oxygen used during your procedure.  There is no need for concern and it should clear up in a day or so.  SYMPTOMS TO REPORT IMMEDIATELY:   Following lower endoscopy (colonoscopy or flexible sigmoidoscopy):  Excessive amounts of blood in the stool  Significant tenderness or worsening of abdominal pains  Swelling of the abdomen that is new, acute  Fever of 100F or higher   Following upper endoscopy (EGD)  Vomiting of blood or coffee ground material  New chest pain or pain under the shoulder blades  Painful or persistently difficult swallowing  New shortness of breath  Fever of 100F or higher  Black, tarry-looking stools  For urgent or emergent issues, a gastroenterologist can be  reached at any hour by calling [phone]. Do not use MyChart messaging for urgent concerns.    DIET:  We do recommend a small meal at first, but then you may proceed to your regular diet.  Drink plenty of fluids but you should avoid alcoholic beverages for 24 hours.  ACTIVITY:  You should plan to take it easy for the rest of today and you should NOT DRIVE or use heavy machinery until tomorrow (because of the sedation medicines used during the test).    FOLLOW UP: Our staff will call the number listed on your records 48-72 hours following your procedure to check on you and address any questions or concerns that you may have regarding the information given to you following your procedure. If we do not reach you, we will leave a message.  We will attempt to reach you two times.  During this call, we will ask if you have developed any symptoms of COVID 19. If you develop any symptoms (ie: fever, flu-like symptoms, shortness of breath, cough etc.) before then, please call [phone].  If you test positive for Covid 19 in the 2 weeks post procedure, please call and report this information to [place].    If any biopsies were taken you will be contacted by phone or by letter within the next 1-3 weeks.  Please call us at [phone] if you have not heard about the biopsies in 3 weeks.    SIGNATURES/CONFIDENTIALITY: You and/or your care  partner have signed paperwork which will be entered into your electronic medical record.  These signatures attest to the fact that that the information above on your After Visit Summary has been reviewed and is understood.  Full responsibility of the confidentiality of this discharge information lies with you and/or your care-partner.

## 2019-06-28 NOTE — Op Note (Signed)
[place] Patient Name: Louis Butler Procedure Date: 06/28/2019 3:30 PM MRN: [id number] Endoscopist: Justice Britain , MD Age: 64 Referring MD:  Date of Birth: 19-Jun-1955 Gender: Male Account #: [id number] Procedure:                Upper GI endoscopy Indications:              Epigastric abdominal pain, Indigestion, Diarrhea,                            Change in bowel habits Medicines:                Monitored Anesthesia Care Procedure:                Pre-Anesthesia Assessment:                           - Prior to the procedure, a History and Physical                            was performed, and patient medications and                            allergies were reviewed. The patient's tolerance of                            previous anesthesia was also reviewed. The risks                            and benefits of the procedure and the sedation                            options and risks were discussed with the patient.                            All questions were answered, and informed consent                            was obtained. Prior Anticoagulants: The patient has                            taken no previous anticoagulant or antiplatelet                            agents. ASA Grade Assessment: II - A patient with                            mild systemic disease. After reviewing the risks                            and benefits, the patient was deemed in                            satisfactory condition to undergo the procedure.  After obtaining informed consent, the endoscope was                            passed under direct vision. Throughout the                            procedure, the patient's blood pressure, pulse, and                            oxygen saturations were monitored continuously. The                            Endoscope was introduced through the mouth, and                            advanced to the second part of  duodenum. The upper                            GI endoscopy was accomplished without difficulty.                            The patient tolerated the procedure. Scope In: Scope Out: Findings:                 No gross lesions were noted in the entire esophagus.                           The Z-line was irregular and was found 41 cm from                            the incisors.                           Multiple dispersed small erosions with no bleeding                            and no stigmata of recent bleeding were found in                            the gastric body and in the gastric antrum.                           No other gross lesions were noted in the entire                            examined stomach. Biopsies were taken with a cold                            forceps for histology and Helicobacter pylori                            testing.                           No  gross lesions were noted in the duodenal bulb,                            in the first portion of the duodenum and in the                            second portion of the duodenum. Biopsies were taken                            with a cold forceps for histology. Complications:            No immediate complications. Estimated Blood Loss:     Estimated blood loss was minimal. Impression:               - No gross lesions in esophagus.                           - Z-line irregular, 41 cm from the incisors.                           - Erosive gastropathy with no bleeding and no                            stigmata of recent bleeding.                           - No other gross lesions in the stomach. Biopsied.                           - No gross lesions in the duodenal bulb, in the                            first portion of the duodenum and in the second                            portion of the duodenum. Biopsied. Recommendation:           - Proceed to scheduled colonoscopy.                           - Increase Nexium  to 40 mg daily.                           - Await pathology results.                           - The findings and recommendations were discussed                            with the patient.                           - The findings and recommendations were discussed  with the patient's family. Justice Britain, MD 06/28/2019 4:29:09 PM

## 2019-06-28 NOTE — Progress Notes (Signed)
pt tolerated well. VSS. awake and to recovery. Report given to RN.  

## 2019-06-30 ENCOUNTER — Telehealth: Payer: Self-pay

## 2019-06-30 ENCOUNTER — Telehealth: Payer: Self-pay | Admitting: *Deleted

## 2019-06-30 NOTE — Telephone Encounter (Signed)
  Follow up Call-  Call back number 06/28/2019  Post procedure Call Back phone  # [phone]  Permission to leave phone message Yes  Some recent data might be hidden     Patient questions:  Do you have a fever, pain , or abdominal swelling? No. Pain Score  0 *  Have you tolerated food without any problems? Yes.    Have you been able to return to your normal activities? Yes.    Do you have any questions about your discharge instructions: Diet   No. Medications  No. Follow up visit  No.  Do you have questions or concerns about your Care? No.  Actions: * If pain score is 4 or above: No action needed, pain <4.  1. Have you developed a fever since your procedure? no  2.   Have you had an respiratory symptoms (SOB or cough) since your procedure? no  3.   Have you tested positive for COVID 19 since your procedure no  4.   Have you had any family members/close contacts diagnosed with the COVID 19 since your procedure?  no   If yes to any of these questions please route to Joylene John, RN and Erenest Rasher, RN

## 2019-06-30 NOTE — Telephone Encounter (Signed)
First follow up call attempt.  Message left on vm.

## 2019-07-05 ENCOUNTER — Telehealth: Payer: Self-pay | Admitting: Gastroenterology

## 2019-07-05 NOTE — Telephone Encounter (Signed)
Tried to call patient to discuss results and next steps in evaluation. Left voicemail for call back. If patient calls back please obtain phone number and I will try to reach him today/tomorrow.  Justice Britain, MD [facility] Advanced [facility]oscopy Office # [phone]

## 2019-07-05 NOTE — Telephone Encounter (Signed)
Patient returned call can be reached at [phone]

## 2019-07-05 NOTE — Telephone Encounter (Signed)
I called and spoke with the patient and patient's wife today. 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 We discussed that his colon polyp burden suggested a combination of tubular adenomas and sessile serrated polyps and hyperplastic polyps and he will require a 3-year follow-up surveillance colonoscopy. We also discussed that his biopsies from his small intestine both from the duodenum as well as the terminal ileum and his colon biopsies showed no evidence of chronic inflammation or celiac disease or IBD or microscopic/collagenous colitis. 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 We will plan to try and get some pancreas enzyme replacement therapy and see if the patient samples can be available for him to try this due to cost but also consider patient assistance. I will plan an endoscopic ultrasound to be done at the same time as gastric mapping in approximately 2 to 2.5 months and we will schedule that soon. The patient and wife would like a copy and a letter of the results on my chart and I will formulate this in the course of the next couple days and get that sent. Once I formulate the letter I will forward this to my staff and we will work on trying to get the pancreas enzyme replacement therapy samples. They were appreciative for the call back and discussion today.  Justice Britain, MD [facility] Office # [phone]

## 2019-07-06 ENCOUNTER — Encounter: Payer: Self-pay | Admitting: Gastroenterology

## 2019-07-19 ENCOUNTER — Ambulatory Visit (INDEPENDENT_AMBULATORY_CARE_PROVIDER_SITE_OTHER): Payer: BC Managed Care – PPO | Admitting: Family Medicine

## 2019-07-19 ENCOUNTER — Other Ambulatory Visit: Payer: Self-pay

## 2019-07-19 ENCOUNTER — Encounter: Payer: Self-pay | Admitting: Family Medicine

## 2019-07-19 DIAGNOSIS — E1129 Type 2 diabetes mellitus with other diabetic kidney complication: Secondary | ICD-10-CM | POA: Diagnosis not present

## 2019-07-19 DIAGNOSIS — K3189 Other diseases of stomach and duodenum: Secondary | ICD-10-CM | POA: Diagnosis not present

## 2019-07-19 DIAGNOSIS — Z720 Tobacco use: Secondary | ICD-10-CM | POA: Diagnosis not present

## 2019-07-19 DIAGNOSIS — E1142 Type 2 diabetes mellitus with diabetic polyneuropathy: Secondary | ICD-10-CM

## 2019-07-19 DIAGNOSIS — K31A0 Gastric intestinal metaplasia, unspecified: Secondary | ICD-10-CM

## 2019-07-19 DIAGNOSIS — N179 Acute kidney failure, unspecified: Secondary | ICD-10-CM | POA: Diagnosis not present

## 2019-07-19 DIAGNOSIS — R339 Retention of urine, unspecified: Secondary | ICD-10-CM

## 2019-07-19 NOTE — Patient Instructions (Addendum)
Will look at your paperwork and call likely tomorrow Do lantus 5u per day  Follow up in 2 months, sooner if needed  Be well, Dr. Ardelia Mems

## 2019-07-19 NOTE — Progress Notes (Signed)
Date of Visit: 07/19/2019   SUBJECTIVE:   HPI:  Louis Butler presents today for routine follow up.  Urinary retention - next uro visit 4/21. Has foley catheter still in place. Reportedly prostate is enlarged and this may be the cause for his retention. He is going to be seeing a Dr. Gloriann Loan to consider a procedure on his prostate.  CKD - continues to follow with Dr. Posey Pronto, last visit 3/29. Per patient and wife, Dr. Posey Pronto said he will most likely end up on dialysis.   Intestinal metaplasia - noted on recent EGD. Endorses ongoing nausea. Will be having gastric mapping done   Diabetes - taking levemir 3u daily. Fasting sugars 170s-180s. No lows otherwise.  Pain in hips/back - has chronic pain in hips/back that is overall unchanged. He is quite fatigued from his current medical issues and prior prolonged hospitalizations.   Tobacco abuse - smoking 1.5 ppd. Not interested in quitting at this time. Has been told by Dr. Posey Pronto that he needs to quit, that if he doesn't it will impact what treatments are offered to him for his CKD.  Requests completion of disability paperwork for long term disability. Has been out of work since his hospitalizations in December/January. 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 He is diligent about checking blood sugars 3 times a day. Has many doctors appts to attend. Clarified with wife what he is reasonably able to do. He cannot sit or stand for longer than 20 or so minutes due to general discomfort. Also would struggle with crouching/kneeling. Can walk for no more than 5 minutes at a time. Wife does all the driving, patient not able to drive right now.  OBJECTIVE:   BP 124/72   Pulse 70   Ht 6\' 3"  (1.905 m)   Wt 203 lb (92.1 kg)   SpO2 99%   BMI 25.37 kg/m  Gen: no acute distress, cooperative. Appears tired HEENT: normocephalic, atraumatic  Heart: regular rate and rhythm, no murmur Lungs:  clear to auscultation bilaterally, normal work of breathing  Neuro: alert, speech normal, grossly nonfocal Ext: No appreciable lower extremity edema bilaterally  Abdomen: soft, nontender to palpation   ASSESSMENT/PLAN:   Acute renal failure (ARF) ([place]) Has progressed to likely CKD and it seems he will likely end up on HD before long. Records reviewed from 3/29 nephrology visit, appears plan at that time was for him to follow up in 2 months. If creatinine not improved then, will be referred to [facility] for HD planning. Per nephro notes, cannot be referred for transplant consideration unless he quits smoking (patient not interested in quitting at this time).  Tobacco use Encouraged smoking cessation. Patient not interested in quitting at this time.  Intestinal metaplasia of gastric mucosa Followed by GI, will be getting gastric mapping. Notes reviewed.  Diabetes mellitus ([place]) Encouraged him to stick to a standard dose of levemir. Would likely do fine with 5u daily, recommend this dose to him. Follow up in 2 months for next A1c.  Diabetic neuropathy ([facility]) Tolerating gabapentin, continue.  Urinary retention Continues to follow up with urology, continues to have foley in place for adequate bladder drainage.  Disability paperwork I do think it is reasonable for him to be out of work on long-term disability right now. He is not able to perform the duties of his job presently due to his severe acute progressing to chronic renal dysfunction and urinary retention.  Did not have time during this visit to get into details of the paperwork, so I reviewed it and called him the next day and spoke with his wife regarding his current limitations. Completed disability paperwork and placed at front desk for wife to pick up.  FOLLOW UP: Follow up in 2 mos for above issues, sooner if needed  [place] J. Ardelia Mems, [place]

## 2019-07-22 ENCOUNTER — Other Ambulatory Visit: Payer: Self-pay

## 2019-07-22 MED ORDER — PANCRELIPASE (LIP-PROT-AMYL) 36000-114000 UNITS PO CPEP: ORAL_CAPSULE | ORAL | 0 refills | Status: DC

## 2019-07-25 DIAGNOSIS — K3189 Other diseases of stomach and duodenum: Secondary | ICD-10-CM | POA: Insufficient documentation

## 2019-07-25 DIAGNOSIS — K31A0 Gastric intestinal metaplasia, unspecified: Secondary | ICD-10-CM | POA: Insufficient documentation

## 2019-07-25 NOTE — Assessment & Plan Note (Signed)
Encouraged smoking cessation. Patient not interested in quitting at this time.

## 2019-07-25 NOTE — Assessment & Plan Note (Addendum)
Has progressed to likely CKD and it seems he will likely end up on HD before long. Records reviewed from 3/29 nephrology visit, appears plan at that time was for him to follow up in 2 months. If creatinine not improved then, will be referred to [facility] for HD planning. Per nephro notes, cannot be referred for transplant consideration unless he quits smoking (patient not interested in quitting at this time).

## 2019-07-25 NOTE — Assessment & Plan Note (Signed)
Encouraged him to stick to a standard dose of levemir. Would likely do fine with 5u daily, recommend this dose to him. Follow up in 2 months for next A1c.

## 2019-07-25 NOTE — Assessment & Plan Note (Signed)
Followed by GI, will be getting gastric mapping. Notes reviewed.

## 2019-07-25 NOTE — Assessment & Plan Note (Signed)
Continues to follow up with urology, continues to have foley in place for adequate bladder drainage.

## 2019-07-25 NOTE — Assessment & Plan Note (Signed)
Tolerating gabapentin, continue.

## 2019-07-26 ENCOUNTER — Telehealth: Payer: Self-pay | Admitting: Gastroenterology

## 2019-07-26 MED ORDER — PANCRELIPASE (LIP-PROT-AMYL) 36000-114000 UNITS PO CPEP: ORAL_CAPSULE | ORAL | 2 refills | Status: DC

## 2019-07-26 NOTE — Telephone Encounter (Signed)
Rx for Creon has been sent to [facility]. [name] wife informed.

## 2019-07-26 NOTE — Telephone Encounter (Signed)
[name] wife called and requested prescription for Creon sent to [facility] in [address]

## 2019-07-28 ENCOUNTER — Other Ambulatory Visit: Payer: Self-pay | Admitting: Urology

## 2019-08-01 NOTE — Patient Instructions (Addendum)
DUE TO COVID-19 ONLY ONE VISITOR IS ALLOWED TO COME WITH YOU AND STAY IN THE WAITING ROOM ONLY DURING PRE OP AND PROCEDURE DAY OF SURGERY. THE 1 VISITOR MAY VISIT WITH YOU AFTER SURGERY IN YOUR PRIVATE ROOM DURING VISITING HOURS ONLY!  YOU NEED TO HAVE A COVID 19 TEST ON_______ @_______ , THIS TEST MUST BE DONE BEFORE SURGERY, COME  [place] , [id number].  ([place]) ONCE YOUR COVID TEST IS COMPLETED, PLEASE BEGIN THE QUARANTINE INSTRUCTIONS AS OUTLINED IN YOUR HANDOUT.                Louis Butler  08/01/2019   Your procedure is scheduled on: 08-08-19    Report to [facility] Main  Entrance    Report to Admitting at 10:45 AM     Call this number if you have problems the morning of surgery [phone]    Remember: Do not eat food or drink liquids :After Midnight.     Take these medicines the morning of surgery with A SIP OF WATER:Carvedilol (Coreg), Esomeprazole (Nexium), Famotidine (Pepcid), and (Flomax)   BRUSH YOUR TEETH MORNING OF SURGERY AND RINSE YOUR MOUTH OUT, NO CHEWING GUM CANDY OR MINTS.     DO NOT TAKE ANY DIABETIC MEDICATIONS DAY OF YOUR SURGERY                               You may not have any metal on your body including hair pins and              piercings    Do not wear jewelry, cologne, lotions, powders or deodorant                        Men may shave face and neck.   Do not bring valuables to the hospital. [place].  Contacts, dentures or bridgework may not be worn into surgery.  You may bring overnight bag    Special Instructions: N/A              Please read over the following fact sheets you were given: _____________________________________________________________________             How to Manage Your Diabetes Before and After Surgery  Why is it important to control my blood sugar before and after surgery? . Improving blood sugar levels before and  after surgery helps healing and can limit problems. . A way of improving blood sugar control is eating a healthy diet by: o  Eating less sugar and carbohydrates o  Increasing activity/exercise o  Talking with your doctor about reaching your blood sugar goals . High blood sugars (greater than 180 mg/dL) can raise your risk of infections and slow your recovery, so you will need to focus on controlling your diabetes during the weeks before surgery. . Make sure that the doctor who takes care of your diabetes knows about your planned surgery including the date and location.  How do I manage my blood sugar before surgery? . Check your blood sugar at least 4 times a day, starting 2 days before surgery, to make sure that the level is not too high or low. o Check your blood sugar the morning of your surgery when you wake up and every 2 hours until  you get to the Short Stay unit. . If your blood sugar is less than 70 mg/dL, you will need to treat for low blood sugar: o Do not take insulin. o Treat a low blood sugar (less than 70 mg/dL) with  cup of clear juice (cranberry or apple), 4 glucose tablets, OR glucose gel. o Recheck blood sugar in 15 minutes after treatment (to make sure it is greater than 70 mg/dL). If your blood sugar is not greater than 70 mg/dL on recheck, call [phone] for further instructions. . Report your blood sugar to the short stay nurse when you get to Short Stay.  . If you are admitted to the hospital after surgery: o Your blood sugar will be checked by the staff and you will probably be given insulin after surgery (instead of oral diabetes medicines) to make sure you have good blood sugar levels. o The goal for blood sugar control after surgery is 80-180 mg/dL.   WHAT DO I DO ABOUT MY DIABETES MEDICATION?  Marland Kitchen Do not take oral diabetes medicines (pills) the morning of surgery.  . THE DAY BEFORE SURGERY, take your 5 units of Levemir  insulin.           Reviewed and  Endorsed by [facility] Patient Education Committee, August 2015  [facility] - Preparing for Surgery Before surgery, you can play an important role.  Because skin is not sterile, your skin needs to be as free of germs as possible.  You can reduce the number of germs on your skin by washing with CHG (chlorahexidine gluconate) soap before surgery.  CHG is an antiseptic cleaner which kills germs and bonds with the skin to continue killing germs even after washing. Please DO NOT use if you have an allergy to CHG or antibacterial soaps.  If your skin becomes reddened/irritated stop using the CHG and inform your nurse when you arrive at Short Stay. Do not shave (including legs and underarms) for at least 48 hours prior to the first CHG shower.  You may shave your face/neck. Please follow these instructions carefully:  1.  Shower with CHG Soap the night before surgery and the  morning of Surgery.  2.  If you choose to wash your hair, wash your hair first as usual with your  normal  shampoo.  3.  After you shampoo, rinse your hair and body thoroughly to remove the  shampoo.                           4.  Use CHG as you would any other liquid soap.  You can apply chg directly  to the skin and wash                       Gently with a scrungie or clean washcloth.  5.  Apply the CHG Soap to your body ONLY FROM THE NECK DOWN.   Do not use on face/ open                           Wound or open sores. Avoid contact with eyes, ears mouth and genitals (private parts).                       Wash face,  Genitals (private parts) with your normal soap.             6.  Wash thoroughly, paying special attention to the area where your surgery  will be performed.  7.  Thoroughly rinse your body with warm water from the neck down.  8.  DO NOT shower/wash with your normal soap after using and rinsing off  the CHG Soap.                9.  Pat yourself dry with a clean towel.            10.  Wear clean pajamas.            11.   Place clean sheets on your bed the night of your first shower and do not  sleep with pets. Day of Surgery : Do not apply any lotions/deodorants the morning of surgery.  Please wear clean clothes to the hospital/surgery center.  FAILURE TO FOLLOW THESE INSTRUCTIONS MAY RESULT IN THE CANCELLATION OF YOUR SURGERY PATIENT SIGNATURE_________________________________  NURSE SIGNATURE__________________________________  ________________________________________________________________________

## 2019-08-01 NOTE — Progress Notes (Signed)
PCP - Chrisandra Netters, MD Cardiologist -   Chest x-ray - 04-08-19  EKG - 04-11-19 Stress Test -  ECHO -  Cardiac Cath -   Sleep Study -   CPAP -   HGA1C dated 06-14-19 7.2  Fasting Blood Sugar -  Checks Blood Sugar _____ times a day  Blood Thinner Instructions: Aspirin Instructions: Last Dose:  Anesthesia review:   Patient denies shortness of breath, fever, cough and chest pain at PAT appointment   Patient verbalized understanding of instructions that were given to them at the PAT appointment. Patient was also instructed that they will need to review over the PAT instructions again at home before surgery.

## 2019-08-02 ENCOUNTER — Encounter (HOSPITAL_COMMUNITY)
Admission: RE | Admit: 2019-08-02 | Discharge: 2019-08-02 | Disposition: A | Discharge status: Home / Self Care | Payer: BC Managed Care – PPO | Source: Ambulatory Visit | Attending: Urology | Admitting: Urology

## 2019-08-03 ENCOUNTER — Encounter (HOSPITAL_COMMUNITY): Payer: BC Managed Care – PPO

## 2019-08-04 ENCOUNTER — Encounter (HOSPITAL_COMMUNITY)
Admission: RE | Admit: 2019-08-04 | Discharge: 2019-08-04 | Disposition: A | Discharge status: Home / Self Care | Payer: BC Managed Care – PPO | Source: Ambulatory Visit | Attending: Urology | Admitting: Urology

## 2019-08-04 ENCOUNTER — Other Ambulatory Visit: Payer: Self-pay

## 2019-08-04 ENCOUNTER — Other Ambulatory Visit (HOSPITAL_COMMUNITY)
Admission: RE | Admit: 2019-08-04 | Discharge: 2019-08-04 | Disposition: A | Discharge status: Home / Self Care | Payer: BC Managed Care – PPO | Source: Ambulatory Visit | Attending: Urology | Admitting: Urology

## 2019-08-04 ENCOUNTER — Encounter (HOSPITAL_COMMUNITY): Payer: Self-pay

## 2019-08-04 DIAGNOSIS — Z20822 Contact with and (suspected) exposure to covid-19: Secondary | ICD-10-CM | POA: Diagnosis not present

## 2019-08-04 DIAGNOSIS — Z01812 Encounter for preprocedural laboratory examination: Secondary | ICD-10-CM | POA: Diagnosis present

## 2019-08-04 HISTORY — DX: Cardiac murmur, unspecified: R01.1

## 2019-08-04 LAB — PROTIME-INR
INR: 1 (ref 0.8–1.2)
Prothrombin Time: 12.9 seconds (ref 11.4–15.2)

## 2019-08-04 LAB — CBC
HCT: 31 % — ABNORMAL LOW (ref 39.0–52.0)
Hemoglobin: 10.2 g/dL — ABNORMAL LOW (ref 13.0–17.0)
MCH: 30.7 pg (ref 26.0–34.0)
MCHC: 32.9 g/dL (ref 30.0–36.0)
MCV: 93.4 fL (ref 80.0–100.0)
Platelets: 242 10*3/uL (ref 150–400)
RBC: 3.32 MIL/uL — ABNORMAL LOW (ref 4.22–5.81)
RDW: 13.7 % (ref 11.5–15.5)
WBC: 8.3 10*3/uL (ref 4.0–10.5)
nRBC: 0 % (ref 0.0–0.2)

## 2019-08-04 LAB — BASIC METABOLIC PANEL
Anion gap: 12 (ref 5–15)
BUN: 76 mg/dL — ABNORMAL HIGH (ref 8–23)
CO2: 28 mmol/L (ref 22–32)
Calcium: 8.1 mg/dL — ABNORMAL LOW (ref 8.9–10.3)
Chloride: 95 mmol/L — ABNORMAL LOW (ref 98–111)
Creatinine, Ser: 5.15 mg/dL — ABNORMAL HIGH (ref 0.61–1.24)
GFR calc Af Amer: 13 mL/min — ABNORMAL LOW (ref >60)
GFR calc non Af Amer: 11 mL/min — ABNORMAL LOW (ref >60)
Glucose, Bld: 138 mg/dL — ABNORMAL HIGH (ref 70–99)
Potassium: 3.7 mmol/L (ref 3.5–5.1)
Sodium: 135 mmol/L (ref 135–145)

## 2019-08-04 LAB — GLUCOSE, CAPILLARY: Glucose-Capillary: 115 mg/dL — ABNORMAL HIGH (ref 70–99)

## 2019-08-04 LAB — SARS CORONAVIRUS 2 (TAT 6-24 HRS): SARS Coronavirus 2: NEGATIVE

## 2019-08-04 NOTE — Patient Instructions (Addendum)
DUE TO COVID-19 ONLY ONE VISITOR IS ALLOWED TO COME WITH YOU AND STAY IN THE WAITING ROOM ONLY DURING PRE OP AND PROCEDURE DAY OF SURGERY. THE 1 VISITOR MAY VISIT WITH YOU AFTER SURGERY IN YOUR PRIVATE ROOM DURING VISITING HOURS ONLY!  YOU NEED TO HAVE A COVID 19 TEST ON: 4/29@ 1:00 pm, THIS TEST MUST BE DONE BEFORE SURGERY, COME  [place] , [id number].  ([place]) ONCE YOUR COVID TEST IS COMPLETED, PLEASE BEGIN THE QUARANTINE INSTRUCTIONS AS OUTLINED IN YOUR HANDOUT.                Louis Butler   Your procedure is scheduled on: 08/08/19   Report to [facility] Main  Entrance   Report to admitting at: 10:45 AM     Call this number if you have problems the morning of surgery [phone]    Remember: Do not eat solid food :After Midnight.Clear liquids from midnight until 9:45 am.   BRUSH YOUR TEETH MORNING OF SURGERY AND RINSE YOUR MOUTH OUT, NO CHEWING GUM CANDY OR MINTS.     Take these medicines the morning of surgery with A SIP OF WATER: carvedilol,esomeprazole,famotidine,tamsulosin. How to Manage Your Diabetes Before and After Surgery  Why is it important to control my blood sugar before and after surgery? . Improving blood sugar levels before and after surgery helps healing and can limit problems. . A way of improving blood sugar control is eating a healthy diet by: o  Eating less sugar and carbohydrates o  Increasing activity/exercise o  Talking with your doctor about reaching your blood sugar goals . High blood sugars (greater than 180 mg/dL) can raise your risk of infections and slow your recovery, so you will need to focus on controlling your diabetes during the weeks before surgery. . Make sure that the doctor who takes care of your diabetes knows about your planned surgery including the date and location.  How do I manage my blood sugar before surgery? . Check your blood sugar at least 4 times a day, starting 2 days before  surgery, to make sure that the level is not too high or low. o Check your blood sugar the morning of your surgery when you wake up and every 2 hours until you get to the Short Stay unit. . If your blood sugar is less than 70 mg/dL, you will need to treat for low blood sugar: o Do not take insulin. o Treat a low blood sugar (less than 70 mg/dL) with  cup of clear juice (cranberry or apple), 4 glucose tablets, OR glucose gel. o Recheck blood sugar in 15 minutes after treatment (to make sure it is greater than 70 mg/dL). If your blood sugar is not greater than 70 mg/dL on recheck, call [phone] for further instructions. . Report your blood sugar to the short stay nurse when you get to Short Stay.  . If you are admitted to the hospital after surgery: o Your blood sugar will be checked by the staff and you will probably be given insulin after surgery (instead of oral diabetes medicines) to make sure you have good blood sugar levels. o The goal for blood sugar control after surgery is 80-180 mg/dL.   WHAT DO I DO ABOUT MY DIABETES MEDICATION?  Marland Kitchen Do not take oral diabetes medicines (pills) the morning of surgery.  . THE DAY BEFORE SURGERY, take Levemir units insulin as usual.       .  THE MORNING OF SURGERY, DO NOT take Levemir insulin.   DO NOT TAKE ANY DIABETIC MEDICATIONS DAY OF YOUR SURGERY                               You may not have any metal on your body including hair pins and              piercings  Do not wear jewelry, lotions, powders or perfumes, deodorant             Men may shave face and neck.   Do not bring valuables to the hospital. [place].  Contacts, dentures or bridgework may not be worn into surgery.  Leave suitcase in the car. After surgery it may be brought to your room.     Patients discharged the day of surgery will not be allowed to drive home. IF YOU ARE HAVING SURGERY AND GOING HOME THE SAME DAY, YOU MUST  HAVE AN ADULT TO DRIVE YOU HOME AND BE WITH YOU FOR 24 HOURS. YOU MAY GO HOME BY TAXI OR UBER OR ORTHERWISE, BUT AN ADULT MUST ACCOMPANY YOU HOME AND STAY WITH YOU FOR 24 HOURS.  Name and phone number of your driver:  Special Instructions: N/A              Please read over the following fact sheets you were given: _____________________________________________________________________   [facility] - Preparing for Surgery Before surgery, you can play an important role.  Because skin is not sterile, your skin needs to be as free of germs as possible.  You can reduce the number of germs on your skin by washing with CHG (chlorahexidine gluconate) soap before surgery.  CHG is an antiseptic cleaner which kills germs and bonds with the skin to continue killing germs even after washing. Please DO NOT use if you have an allergy to CHG or antibacterial soaps.  If your skin becomes reddened/irritated stop using the CHG and inform your nurse when you arrive at Short Stay. Do not shave (including legs and underarms) for at least 48 hours prior to the first CHG shower.  You may shave your face/neck. Please follow these instructions carefully:  1.  Shower with CHG Soap the night before surgery and the  morning of Surgery.  2.  If you choose to wash your hair, wash your hair first as usual with your  normal  shampoo.  3.  After you shampoo, rinse your hair and body thoroughly to remove the  shampoo.                           4.  Use CHG as you would any other liquid soap.  You can apply chg directly  to the skin and wash                       Gently with a scrungie or clean washcloth.  5.  Apply the CHG Soap to your body ONLY FROM THE NECK DOWN.   Do not use on face/ open                           Wound or open sores. Avoid contact with eyes, ears mouth and genitals (private  parts).                       Wash face,  Genitals (private parts) with your normal soap.             6.  Wash thoroughly, paying special  attention to the area where your surgery  will be performed.  7.  Thoroughly rinse your body with warm water from the neck down.  8.  DO NOT shower/wash with your normal soap after using and rinsing off  the CHG Soap.                9.  Pat yourself dry with a clean towel.            10.  Wear clean pajamas.            11.  Place clean sheets on your bed the night of your first shower and do not  sleep with pets. Day of Surgery : Do not apply any lotions/deodorants the morning of surgery.  Please wear clean clothes to the hospital/surgery center.  FAILURE TO FOLLOW THESE INSTRUCTIONS MAY RESULT IN THE CANCELLATION OF YOUR SURGERY PATIENT SIGNATURE_________________________________  NURSE SIGNATURE__________________________________  ________________________________________________________________________

## 2019-08-04 NOTE — Progress Notes (Signed)
PCP - Dr. Ardelia Mems B. LOV: 07/19/19 Cardiologist -   Chest x-ray - 04/08/19 EKG - 04/11/19. Epic. Stress Test -  ECHO -  Cardiac Cath -   Sleep Study -  CPAP -   Fasting Blood Sugar -  Checks Blood Sugar _____ times a day  Blood Thinner Instructions: Aspirin Instructions: Last Dose:  Anesthesia review:   Patient denies shortness of breath, fever, cough and chest pain at PAT appointment   Patient verbalized understanding of instructions that were given to them at the PAT appointment. Patient was also instructed that they will need to review over the PAT instructions again at home before surgery.

## 2019-08-05 LAB — ABO/RH: ABO/RH(D): O POS

## 2019-08-05 NOTE — Progress Notes (Signed)
Lab results: BUN: 76.   Cr: 5.15

## 2019-08-08 ENCOUNTER — Ambulatory Visit (HOSPITAL_COMMUNITY): Payer: BC Managed Care – PPO | Admitting: Anesthesiology

## 2019-08-08 ENCOUNTER — Encounter (HOSPITAL_COMMUNITY)
Admission: RE | Disposition: A | Discharge status: Home / Self Care | Payer: Self-pay | Source: Home / Self Care | Attending: Urology

## 2019-08-08 ENCOUNTER — Observation Stay (HOSPITAL_COMMUNITY)
Admission: RE | Admit: 2019-08-08 | Discharge: 2019-08-09 | Disposition: A | Discharge status: Home / Self Care | Payer: BC Managed Care – PPO | Attending: Urology | Admitting: Urology

## 2019-08-08 ENCOUNTER — Ambulatory Visit (HOSPITAL_COMMUNITY): Payer: BC Managed Care – PPO | Admitting: Physician Assistant

## 2019-08-08 ENCOUNTER — Encounter (HOSPITAL_COMMUNITY): Payer: Self-pay | Admitting: Urology

## 2019-08-08 ENCOUNTER — Other Ambulatory Visit: Payer: Self-pay

## 2019-08-08 DIAGNOSIS — Z79899 Other long term (current) drug therapy: Secondary | ICD-10-CM | POA: Insufficient documentation

## 2019-08-08 DIAGNOSIS — R338 Other retention of urine: Secondary | ICD-10-CM | POA: Diagnosis not present

## 2019-08-08 DIAGNOSIS — E1122 Type 2 diabetes mellitus with diabetic chronic kidney disease: Secondary | ICD-10-CM | POA: Diagnosis not present

## 2019-08-08 DIAGNOSIS — N400 Enlarged prostate without lower urinary tract symptoms: Secondary | ICD-10-CM | POA: Diagnosis present

## 2019-08-08 DIAGNOSIS — N401 Enlarged prostate with lower urinary tract symptoms: Secondary | ICD-10-CM | POA: Diagnosis not present

## 2019-08-08 DIAGNOSIS — N201 Calculus of ureter: Secondary | ICD-10-CM | POA: Diagnosis not present

## 2019-08-08 DIAGNOSIS — I120 Hypertensive chronic kidney disease with stage 5 chronic kidney disease or end stage renal disease: Secondary | ICD-10-CM | POA: Insufficient documentation

## 2019-08-08 DIAGNOSIS — N185 Chronic kidney disease, stage 5: Secondary | ICD-10-CM | POA: Diagnosis not present

## 2019-08-08 DIAGNOSIS — Z87891 Personal history of nicotine dependence: Secondary | ICD-10-CM | POA: Insufficient documentation

## 2019-08-08 DIAGNOSIS — N320 Bladder-neck obstruction: Secondary | ICD-10-CM | POA: Insufficient documentation

## 2019-08-08 HISTORY — PX: TRANSURETHRAL RESECTION OF PROSTATE: SHX73

## 2019-08-08 LAB — BASIC METABOLIC PANEL
Anion gap: 11 (ref 5–15)
BUN: 65 mg/dL — ABNORMAL HIGH (ref 8–23)
CO2: 28 mmol/L (ref 22–32)
Calcium: 8 mg/dL — ABNORMAL LOW (ref 8.9–10.3)
Chloride: 98 mmol/L (ref 98–111)
Creatinine, Ser: 4.62 mg/dL — ABNORMAL HIGH (ref 0.61–1.24)
GFR calc Af Amer: 14 mL/min — ABNORMAL LOW (ref >60)
GFR calc non Af Amer: 12 mL/min — ABNORMAL LOW (ref >60)
Glucose, Bld: 148 mg/dL — ABNORMAL HIGH (ref 70–99)
Potassium: 3.2 mmol/L — ABNORMAL LOW (ref 3.5–5.1)
Sodium: 137 mmol/L (ref 135–145)

## 2019-08-08 LAB — TYPE AND SCREEN
ABO/RH(D): O POS
Antibody Screen: NEGATIVE

## 2019-08-08 LAB — HEMOGLOBIN A1C
Hgb A1c MFr Bld: 6.9 % — ABNORMAL HIGH (ref 4.8–5.6)
Mean Plasma Glucose: 151.33 mg/dL

## 2019-08-08 LAB — GLUCOSE, CAPILLARY
Glucose-Capillary: 167 mg/dL — ABNORMAL HIGH (ref 70–99)
Glucose-Capillary: 152 mg/dL — ABNORMAL HIGH (ref 70–99)

## 2019-08-08 SURGERY — TURP (TRANSURETHRAL RESECTION OF PROSTATE)
Anesthesia: General

## 2019-08-08 MED ORDER — FENTANYL CITRATE (PF) 100 MCG/2ML IJ SOLN
INTRAMUSCULAR | Status: AC
  Administered 2019-08-08: 25 ug via INTRAVENOUS
  Filled 2019-08-08: qty 2

## 2019-08-08 MED ORDER — SODIUM CHLORIDE 0.9 % IV SOLN: INTRAVENOUS | Status: DC

## 2019-08-08 MED ORDER — 0.9 % SODIUM CHLORIDE (POUR BTL) OPTIME
TOPICAL | Status: DC | PRN
  Administered 2019-08-08: 14:00:00 1000 mL

## 2019-08-08 MED ORDER — CARVEDILOL 12.5 MG PO TABS
12.5000 mg | ORAL_TABLET | Freq: Two times a day (BID) | ORAL | Status: DC
  Administered 2019-08-08 – 2019-08-09 (×2): 12.5 mg via ORAL
  Filled 2019-08-08 (×2): qty 1

## 2019-08-08 MED ORDER — LACTATED RINGERS IV SOLN: INTRAVENOUS | Status: DC

## 2019-08-08 MED ORDER — PROPOFOL 10 MG/ML IV BOLUS
INTRAVENOUS | Status: DC | PRN
  Administered 2019-08-08: 18 mg via INTRAVENOUS

## 2019-08-08 MED ORDER — STERILE WATER FOR IRRIGATION IR SOLN
Status: DC | PRN
  Administered 2019-08-08: 500 mL

## 2019-08-08 MED ORDER — SODIUM CHLORIDE 0.9 % IR SOLN
3000.0000 mL | Status: DC
  Administered 2019-08-08 – 2019-08-09 (×2): 3000 mL

## 2019-08-08 MED ORDER — SODIUM CHLORIDE 0.9 % IR SOLN
Status: DC | PRN
  Administered 2019-08-08: 12000 mL via INTRAVESICAL

## 2019-08-08 MED ORDER — FENTANYL CITRATE (PF) 100 MCG/2ML IJ SOLN
INTRAMUSCULAR | Status: DC | PRN
  Administered 2019-08-08 (×2): 50 ug via INTRAVENOUS

## 2019-08-08 MED ORDER — FAMOTIDINE 20 MG PO TABS: 20.0000 mg | ORAL_TABLET | Freq: Every day | ORAL | Status: DC | PRN

## 2019-08-08 MED ORDER — FENTANYL CITRATE (PF) 100 MCG/2ML IJ SOLN
INTRAMUSCULAR | Status: AC
  Filled 2019-08-08: qty 2

## 2019-08-08 MED ORDER — EPHEDRINE SULFATE-NACL 50-0.9 MG/10ML-% IV SOSY
PREFILLED_SYRINGE | INTRAVENOUS | Status: DC | PRN
  Administered 2019-08-08 (×4): 15 mg via INTRAVENOUS

## 2019-08-08 MED ORDER — PHENYLEPHRINE 40 MCG/ML (10ML) SYRINGE FOR IV PUSH (FOR BLOOD PRESSURE SUPPORT)
PREFILLED_SYRINGE | INTRAVENOUS | Status: DC | PRN
  Administered 2019-08-08 (×2): 120 ug via INTRAVENOUS

## 2019-08-08 MED ORDER — TRAMADOL HCL 50 MG PO TABS: 50.0000 mg | ORAL_TABLET | Freq: Four times a day (QID) | ORAL | 0 refills | Status: DC | PRN

## 2019-08-08 MED ORDER — CEFAZOLIN SODIUM-DEXTROSE 2-4 GM/100ML-% IV SOLN
2.0000 g | Freq: Three times a day (TID) | INTRAVENOUS | Status: AC
  Administered 2019-08-08 – 2019-08-09 (×2): 2 g via INTRAVENOUS
  Filled 2019-08-08 (×2): qty 100

## 2019-08-08 MED ORDER — LIDOCAINE 2% (20 MG/ML) 5 ML SYRINGE
INTRAMUSCULAR | Status: DC | PRN
Start: 2019-08-08 — End: 2019-08-08
  Administered 2019-08-08: 80 mg via INTRAVENOUS

## 2019-08-08 MED ORDER — BACITRACIN-NEOMYCIN-POLYMYXIN 400-5-5000 EX OINT: 1.0000 "application " | TOPICAL_OINTMENT | Freq: Three times a day (TID) | CUTANEOUS | Status: DC | PRN

## 2019-08-08 MED ORDER — OXYBUTYNIN CHLORIDE 5 MG PO TABS: 5.0000 mg | ORAL_TABLET | Freq: Three times a day (TID) | ORAL | Status: DC | PRN

## 2019-08-08 MED ORDER — SENNOSIDES-DOCUSATE SODIUM 8.6-50 MG PO TABS
2.0000 | ORAL_TABLET | Freq: Every day | ORAL | Status: DC
  Administered 2019-08-08: 2 via ORAL
  Filled 2019-08-08: qty 2

## 2019-08-08 MED ORDER — PANCRELIPASE (LIP-PROT-AMYL) 12000-38000 UNITS PO CPEP: 36000.0000 [IU] | ORAL_CAPSULE | Freq: Two times a day (BID) | ORAL | Status: DC | PRN

## 2019-08-08 MED ORDER — GABAPENTIN 300 MG PO CAPS
300.0000 mg | ORAL_CAPSULE | Freq: Every day | ORAL | Status: DC
  Administered 2019-08-08: 300 mg via ORAL
  Filled 2019-08-08: qty 1

## 2019-08-08 MED ORDER — INSULIN ASPART 100 UNIT/ML ~~LOC~~ SOLN
0.0000 [IU] | Freq: Three times a day (TID) | SUBCUTANEOUS | Status: DC
  Administered 2019-08-09: 2 [IU] via SUBCUTANEOUS

## 2019-08-08 MED ORDER — PROPOFOL 10 MG/ML IV BOLUS
INTRAVENOUS | Status: AC
  Filled 2019-08-08: qty 20

## 2019-08-08 MED ORDER — FUROSEMIDE 40 MG PO TABS
80.0000 mg | ORAL_TABLET | Freq: Two times a day (BID) | ORAL | Status: DC
  Administered 2019-08-08 – 2019-08-09 (×2): 80 mg via ORAL
  Filled 2019-08-08 (×2): qty 2

## 2019-08-08 MED ORDER — ONDANSETRON HCL 4 MG/2ML IJ SOLN: 4.0000 mg | INTRAMUSCULAR | Status: DC | PRN

## 2019-08-08 MED ORDER — DIPHENHYDRAMINE HCL 12.5 MG/5ML PO ELIX: 12.5000 mg | ORAL_SOLUTION | Freq: Four times a day (QID) | ORAL | Status: DC | PRN

## 2019-08-08 MED ORDER — PANCRELIPASE (LIP-PROT-AMYL) 12000-38000 UNITS PO CPEP
36000.0000 [IU] | ORAL_CAPSULE | Freq: Three times a day (TID) | ORAL | Status: DC
  Administered 2019-08-09: 36000 [IU] via ORAL
  Filled 2019-08-08: qty 3

## 2019-08-08 MED ORDER — ZOLPIDEM TARTRATE 5 MG PO TABS: 5.0000 mg | ORAL_TABLET | Freq: Every evening | ORAL | Status: DC | PRN

## 2019-08-08 MED ORDER — ONDANSETRON HCL 4 MG/2ML IJ SOLN
INTRAMUSCULAR | Status: DC | PRN
  Administered 2019-08-08: 4 mg via INTRAVENOUS

## 2019-08-08 MED ORDER — FENTANYL CITRATE (PF) 100 MCG/2ML IJ SOLN
25.0000 ug | INTRAMUSCULAR | Status: DC | PRN
  Administered 2019-08-08: 50 ug via INTRAVENOUS
  Administered 2019-08-08: 25 ug via INTRAVENOUS

## 2019-08-08 MED ORDER — PANTOPRAZOLE SODIUM 40 MG PO TBEC: 80.0000 mg | DELAYED_RELEASE_TABLET | Freq: Every day | ORAL | Status: DC

## 2019-08-08 MED ORDER — BELLADONNA ALKALOIDS-OPIUM 16.2-60 MG RE SUPP: 1.0000 | Freq: Four times a day (QID) | RECTAL | Status: DC | PRN

## 2019-08-08 MED ORDER — MORPHINE SULFATE (PF) 2 MG/ML IV SOLN
2.0000 mg | INTRAVENOUS | Status: DC | PRN
  Administered 2019-08-08: 4 mg via INTRAVENOUS
  Filled 2019-08-08: qty 2

## 2019-08-08 MED ORDER — ACETAMINOPHEN 325 MG PO TABS: 650.0000 mg | ORAL_TABLET | ORAL | Status: DC | PRN

## 2019-08-08 MED ORDER — DIPHENHYDRAMINE HCL 50 MG/ML IJ SOLN: 12.5000 mg | Freq: Four times a day (QID) | INTRAMUSCULAR | Status: DC | PRN

## 2019-08-08 MED ORDER — TRAMADOL HCL 50 MG PO TABS: 50.0000 mg | ORAL_TABLET | Freq: Four times a day (QID) | ORAL | Status: DC | PRN

## 2019-08-08 MED ORDER — CEFAZOLIN SODIUM-DEXTROSE 2-4 GM/100ML-% IV SOLN
2.0000 g | INTRAVENOUS | Status: AC
  Administered 2019-08-08: 2 g via INTRAVENOUS
  Filled 2019-08-08: qty 100

## 2019-08-08 SURGICAL SUPPLY — 20 items
BAG DRN RND TRDRP ANRFLXCHMBR (UROLOGICAL SUPPLIES) ×1
BAG URINE DRAIN 2000ML AR STRL (UROLOGICAL SUPPLIES) ×3 IMPLANT
BAG URO CATCHER STRL LF (MISCELLANEOUS) ×3 IMPLANT
CATH FOLEY 3WAY 30CC 24FR (CATHETERS) ×3
CATH URTH STD 24FR FL 3W 2 (CATHETERS) ×1 IMPLANT
CLOTH BEACON ORANGE TIMEOUT ST (SAFETY) ×3 IMPLANT
ELECT REM PT RETURN 15FT ADLT (MISCELLANEOUS) ×1 IMPLANT
GLOVE BIO SURGEON STRL SZ7.5 (GLOVE) ×3 IMPLANT
GOWN STRL REUS W/TWL XL LVL3 (GOWN DISPOSABLE) ×3 IMPLANT
HOLDER FOLEY CATH W/STRAP (MISCELLANEOUS) ×3 IMPLANT
KIT TURNOVER KIT A (KITS) IMPLANT
LOOP CUT BIPOLAR 24F LRG (ELECTROSURGICAL) ×3 IMPLANT
MANIFOLD NEPTUNE II (INSTRUMENTS) ×3 IMPLANT
PENCIL SMOKE EVACUATOR (MISCELLANEOUS) IMPLANT
SYR TOOMEY IRRIG 70ML (MISCELLANEOUS) ×3
SYRINGE TOOMEY IRRIG 70ML (MISCELLANEOUS) ×1 IMPLANT
TRAY CYSTO PACK (CUSTOM PROCEDURE TRAY) ×3 IMPLANT
TUBING CONNECTING 10 (TUBING) ×2 IMPLANT
TUBING CONNECTING 10' (TUBING) ×1
TUBING UROLOGY SET (TUBING) ×3 IMPLANT

## 2019-08-08 NOTE — Transfer of Care (Signed)
Immediate Anesthesia Transfer of Care Note  Patient: Louis Butler  Procedure(s) Performed: TRANSURETHRAL RESECTION OF THE PROSTATE (TURP) (N/A )  Patient Location: PACU  Anesthesia Type:General  Level of Consciousness: awake, alert  and oriented  Airway & Oxygen Therapy: Patient Spontanous Breathing and Patient connected to face mask oxygen  Post-op Assessment: Report given to RN and Post -op Vital signs reviewed and stable  Post vital signs: Reviewed and stable  Last Vitals:  Vitals Value Taken Time  BP 114/66 08/08/19 1422  Temp    Pulse 66 08/08/19 1425  Resp 15 08/08/19 1425  SpO2 100 % 08/08/19 1425  Vitals shown include unvalidated device data.  Last Pain:  Vitals:   08/08/19 1056  TempSrc: Oral  PainSc:       Patients Stated Pain Goal: 4 (14/78/29 5621)  Complications: No apparent anesthesia complications

## 2019-08-08 NOTE — Discharge Instructions (Signed)

## 2019-08-08 NOTE — Anesthesia Postprocedure Evaluation (Signed)
Anesthesia Post Note  Patient: Louis Butler  Procedure(s) Performed: TRANSURETHRAL RESECTION OF THE PROSTATE (TURP) (N/A )     Patient location during evaluation: PACU Anesthesia Type: General Level of consciousness: awake and alert Pain management: pain level controlled Vital Signs Assessment: post-procedure vital signs reviewed and stable Respiratory status: spontaneous breathing, nonlabored ventilation, respiratory function stable and patient connected to nasal cannula oxygen Cardiovascular status: blood pressure returned to baseline and stable Postop Assessment: no apparent nausea or vomiting Anesthetic complications: no    Last Vitals:  Vitals:   08/08/19 1445 08/08/19 1500  BP: 103/61 (!) 110/59  Pulse: 62 (!) 31  Resp: 14 16  Temp:    SpO2: 100% 100%    Last Pain:  Vitals:   08/08/19 1500  TempSrc:   PainSc: 5                  Clark Cuff S

## 2019-08-08 NOTE — Anesthesia Preprocedure Evaluation (Signed)
Anesthesia Evaluation  Patient identified by MRN, date of birth, ID band Patient awake    Reviewed: Allergy & Precautions, NPO status , Patient's Chart, lab work & pertinent test results  Airway Mallampati: II  TM Distance: >3 FB Neck ROM: Full    Dental no notable dental hx.    Pulmonary neg pulmonary ROS, Current Smoker and Patient abstained from smoking.,    Pulmonary exam normal breath sounds clear to auscultation       Cardiovascular hypertension, Pt. on medications and Pt. on home beta blockers Normal cardiovascular exam Rhythm:Regular Rate:Normal     Neuro/Psych negative neurological ROS  negative psych ROS   GI/Hepatic negative GI ROS, Neg liver ROS,   Endo/Other  diabetes  Renal/GU Renal InsufficiencyRenal disease  negative genitourinary   Musculoskeletal negative musculoskeletal ROS (+)   Abdominal   Peds negative pediatric ROS (+)  Hematology  (+) anemia ,   Anesthesia Other Findings   Reproductive/Obstetrics negative OB ROS                             Anesthesia Physical Anesthesia Plan  ASA: III  Anesthesia Plan: General   Post-op Pain Management:    Induction: Intravenous  PONV Risk Score and Plan: 1 and Ondansetron, Dexamethasone and Treatment may vary due to age or medical condition  Airway Management Planned: LMA  Additional Equipment:   Intra-op Plan:   Post-operative Plan: Extubation in OR  Informed Consent: I have reviewed the patients History and Physical, chart, labs and discussed the procedure including the risks, benefits and alternatives for the proposed anesthesia with the patient or authorized representative who has indicated his/her understanding and acceptance.     Dental advisory given  Plan Discussed with: CRNA and Surgeon  Anesthesia Plan Comments:         Anesthesia Quick Evaluation

## 2019-08-08 NOTE — Op Note (Signed)
Preoperative diagnosis: 1. Bladder outlet obstruction secondary to BPH 2. Urinary retention  Postoperative diagnosis:  1. Bladder outlet obstruction secondary to BPH 2. Urinary retention  Procedure:  1. Cystoscopy 2. Transurethral resection of the prostate  Surgeon: Marton Redwood, III. M.D.  Anesthesia: General  Complications: None  EBL: Minimal  Specimens: 1. Prostate chips  Indication: Louis Butler is a patient with bladder outlet obstruction secondary to benign prostatic hyperplasia. After reviewing the management options for treatment, he elected to proceed with the above surgical procedure(s). We have discussed the potential benefits and risks of the procedure, side effects of the proposed treatment, the likelihood of the patient achieving the goals of the procedure, and any potential problems that might occur during the procedure or recuperation. Informed consent has been obtained.  Description of procedure:  The patient was taken to the operating room and general anesthesia was induced.  The patient was placed in the dorsal lithotomy position, prepped and draped in the usual sterile fashion, and preoperative antibiotics were administered. A preoperative time-out was performed.   Cystourethroscopy was performed.  The patient's urethra was examined and was normal/ demonstrated bilobar prostatic hypertrophy with a median lobe.   The bladder was then systematically examined in its entirety. There was no evidence of any bladder tumors, stones, or other mucosal pathology.  The ureteral orifices were identified and marked so as to be avoided during the procedure.  The prostate adenoma was then resected utilizing loop cautery resection with the bipolar cutting loop.  The prostate adenoma from the bladder neck back to the verumontanum was resected beginning at the six o'clock position and then extended to include the right and left lobes of the prostate and anterior prostate. Care  was taken not to resect distal to the verumontanum.  Hemostasis was then achieved with the cautery and the bladder was emptied and reinspected with no significant bleeding noted at the end of the procedure.    A 24 French 3 way catheter was then placed into the bladder and continuous bladder irrigation was started.  The patient appeared to tolerate the procedure well and without complications.  The patient was able to be awakened and transferred to the recovery unit in satisfactory condition.

## 2019-08-08 NOTE — Anesthesia Procedure Notes (Signed)
Procedure Name: LMA Insertion Date/Time: 08/08/2019 1:30 PM Performed by: [place] Ericca Labra C, CRNA Pre-anesthesia Checklist: Patient identified, Emergency Drugs available, Suction available and Patient being monitored Patient Re-evaluated:Patient Re-evaluated prior to induction Oxygen Delivery Method: Circle system utilized Preoxygenation: Pre-oxygenation with 100% oxygen Induction Type: IV induction Ventilation: Mask ventilation without difficulty LMA: LMA inserted LMA Size: 4.0 Number of attempts: 1 Airway Equipment and Method: Bite block Placement Confirmation: positive ETCO2 Tube secured with: Tape Dental Injury: Teeth and Oropharynx as per pre-operative assessment

## 2019-08-08 NOTE — H&P (Signed)
CC/HPI: CC: Urinary retention  HPI:  This is a referral from Dr. Matilde Sprang. The patient has stage 5 CKD. He is on the edge of needing dialysis. He has not yet started this. He has had a couple of episodes of severe acute urinary retention with hydronephrosis. This has improved with Foley catheter placement. He was also noted to have a right nonobstructing ureteral stone. He has not had a repeat CT scan. He continues with a Foley catheter. He desires intervention. He prefers to have anesthesia if possible. He does have CKD as mentioned above, hypertension, diabetes. He also had a indeterminate right renal mass that appears to be more consistent with a cyst.     ALLERGIES: percocet Seasonal Allergies     MEDICATIONS: Carvedilol 3.125 mg tablet  Creon 36,000 unit-114,000 unit-180,000 unit capsule,delayed release  Esomeprazole Magnesium 40 mg capsule,delayed release  Flomax 0.4 mg capsule 2 capsule PO Daily  Furosemide 80 mg tablet  Gabapentin 300 mg capsule  Vitamin D3     GU PSH: Complex cystometrogram, w/ void pressure and urethral pressure profile studies, any technique - 05/25/2019 Complex Uroflow - 05/25/2019 Emg surf Electrd - 05/25/2019 Inject For cystogram - 05/25/2019 Intrabd voidng Press - 05/25/2019     NON-GU PSH: Anesth, Upper Arm Surgery - 1981     GU PMH: Urinary Retention - 05/25/2019 Acute Cystitis/UTI - 04/27/2019 Incomplete bladder emptying - 04/27/2019 Weak Urinary Stream - 04/27/2019 Kidney Failure Unspec    NON-GU PMH: Arrhythmia Arthritis Diabetes Type 2 GERD Hypertension    FAMILY HISTORY: 2 sons - Son Blood In Urine - Runs in Family kidney disease - Runs in Family Prostate Cancer - Runs in Family   SOCIAL HISTORY: Marital Status: Married Current Smoking Status: Patient does not smoke anymore. Smoked for 45 years. Smoked 2 packs per day.   Tobacco Use Assessment Completed: Used Tobacco in last 30 days? Does not use smokeless tobacco. Has never drank.   Does not drink caffeine.    REVIEW OF SYSTEMS:    GU Review Male:   Patient denies erection problems, get up at night to urinate, trouble starting your stream, hard to postpone urination, penile pain, burning/ pain with urination, have to strain to urinate , stream starts and stops, leakage of urine, and frequent urination.  Gastrointestinal (Upper):   Patient denies nausea, vomiting, and indigestion/ heartburn.  Gastrointestinal (Lower):   Patient reports diarrhea and constipation.   Constitutional:   Patient reports fatigue. Patient denies fever, night sweats, and weight loss.  Skin:   Patient reports skin rash/ lesion and itching.   Eyes:   Patient reports blurred vision. Patient denies double vision.  Ears/ Nose/ Throat:   Patient reports sinus problems. Patient denies sore throat.  Hematologic/Lymphatic:   Patient reports easy bruising. Patient denies swollen glands.  Cardiovascular:   Patient denies leg swelling and chest pains.  Respiratory:   Patient denies cough and shortness of breath.  Endocrine:   Patient denies excessive thirst.  Musculoskeletal:   Patient reports back pain. Patient denies joint pain.  Neurological:   Patient reports headaches. Patient denies dizziness.  Psychologic:   Patient denies depression and anxiety.   VITAL SIGNS:      07/27/2019 02:50 PM  Weight 210 lb / 95.25 kg  Height 75 in / 190.5 cm  BP 125/79 mmHg  Pulse 39 /min  Temperature 98.4 F / 36.8 C  BMI 26.2 kg/m   GU PHYSICAL EXAMINATION:    Testes: No tenderness, no swelling, no  enlargement left testes. No tenderness, no swelling, no enlargement right testes. Normal location left testes. Normal location right testes. No mass, no cyst, no varicocele, no hydrocele left testes. No mass, no cyst, no varicocele, no hydrocele right testes.  Penis: Circumcised, Foley draining clear yellow urine   Prostate: Prostate about 50 grams. Left lobe normal consistency, right lobe normal consistency.  Symmetrical lobes. No prostate nodule. Left lobe no tenderness, right lobe no tenderness.    MULTI-SYSTEM PHYSICAL EXAMINATION:       Complexity of Data:  Records Review:   Previous Doctor Records, Previous Patient Records  X-Ray Review: C.T. Abdomen/Pelvis: Reviewed Films. Reviewed Report. Discussed With Patient.     PROCEDURES:         Flexible Cystoscopy - 52000  Risks, benefits, and some of the potential complications of the procedure were discussed at length with the patient including infection, bleeding, voiding discomfort, urinary retention, fever, chills, sepsis, and others. All questions were answered. Informed consent was obtained. Antibiotic prophylaxis was given. Sterile technique and intraurethral analgesia were used.  Meatus:  Normal size. Normal location. Normal condition.  Urethra:  No strictures.  External Sphincter:  Normal.  Verumontanum:  Normal.  Prostate:  Obstructing. Moderate hyperplasia. About 3 cm, small median lobe  Bladder Neck:  Non-obstructing.  Ureteral Orifices:  Normal location. Normal size. Normal shape.  Bladder:  No trabeculation. No tumors. Normal mucosa. No stones.      The lower urinary tract was carefully examined. The procedure was well-tolerated and without complications. Antibiotic instructions were given. Instructions were given to call the office immediately for bloody urine, difficulty urinating, urinary retention, painful or frequent urination, fever, chills, nausea, vomiting or other illness. The patient stated that he understood these instructions and would comply with them.        Simple Foley Catheterization - [id number]  A 16 French Foley catheter was inserted into the bladder using sterile technique. UROJET prior. The patient was taught routine catheter care. Hand irrigation of the bladder with sterile water was performed. A leg bag was connected.   ASSESSMENT:      ICD-10 Details  1 GU:   BPH w/LUTS - N40.1 Chronic, Stable  2   Urinary  Retention - R33.8 Chronic, Stable  3   Ureteral calculus - N20.1 Chronic, Stable   PLAN:           Orders         Schedule X-Rays: 1 Week - C.T. Stone Protocol Without Contrast  Return Visit/Planned Activity: 1 Week - C.T. Stone Protocol          Document Letter(s):  Created for Patient: Clinical Summary         Notes:   The patient has failed medical management for his lower urinary tract symptoms. He would like to proceed with surgical resection. I discussed bipolar transurethral resection of the prostate. I specifically discussed the risks including but not limited to bleeding which could require blood transfusion, infection, and injury to surrounding structures. Also discussed the possibility that the surgery would not improve symptoms though most men have a great improvement in their symptoms. Also discussed the low likelihood but possibility of development of new symptoms such as irritative voiding symptoms or urinary incontinence. Most men will have some degree of urinary urgency and discomfort immediately following the surgery that resolves in a short amount of time. He understands that most often this is an outpatient procedure but occasionally patients require hospitalization for continuous bladder irrigation in  the event of excess bleeding. He also understands the possibility of being sent home with a urethral catheter. The patient expressed understanding and is eager to proceed.   I would like to repeat a CT renal stone protocol prior to proceeding   CC: Dr. Matilde Sprang  Dr. Ardelia Mems    Signed by Link Snuffer, III, M.D. on 07/28/19 at 10:01 AM (EDT

## 2019-08-09 DIAGNOSIS — N401 Enlarged prostate with lower urinary tract symptoms: Secondary | ICD-10-CM | POA: Diagnosis not present

## 2019-08-09 LAB — BASIC METABOLIC PANEL
Anion gap: 13 (ref 5–15)
BUN: 63 mg/dL — ABNORMAL HIGH (ref 8–23)
CO2: 25 mmol/L (ref 22–32)
Calcium: 7.8 mg/dL — ABNORMAL LOW (ref 8.9–10.3)
Chloride: 96 mmol/L — ABNORMAL LOW (ref 98–111)
Creatinine, Ser: 4.47 mg/dL — ABNORMAL HIGH (ref 0.61–1.24)
GFR calc Af Amer: 15 mL/min — ABNORMAL LOW (ref >60)
GFR calc non Af Amer: 13 mL/min — ABNORMAL LOW (ref >60)
Glucose, Bld: 138 mg/dL — ABNORMAL HIGH (ref 70–99)
Potassium: 3.2 mmol/L — ABNORMAL LOW (ref 3.5–5.1)
Sodium: 134 mmol/L — ABNORMAL LOW (ref 135–145)

## 2019-08-09 LAB — GLUCOSE, CAPILLARY: Glucose-Capillary: 128 mg/dL — ABNORMAL HIGH (ref 70–99)

## 2019-08-09 LAB — SURGICAL PATHOLOGY

## 2019-08-09 MED ORDER — POTASSIUM CHLORIDE 20 MEQ PO PACK
20.0000 meq | PACK | Freq: Once | ORAL | Status: DC
  Filled 2019-08-09: qty 1

## 2019-08-09 NOTE — Plan of Care (Signed)
Discharge instructions reviewed with patient, questions answered, verbalized understanding. Patient ambulatory accompanied by RN to main entrance to be picked up by his wife.

## 2019-08-09 NOTE — Discharge Summary (Signed)
Physician Discharge Summary  Patient ID: Louis Butler MRN: [id number] DOB/AGE: 07-Dec-1955 64 y.o.  Admit date: 08/08/2019 Discharge date: 08/09/2019  Admission Diagnoses:  Discharge Diagnoses:  Active Problems:   BPH (benign prostatic hyperplasia)   Discharged Condition: good  Hospital Course: Patient underwent a TURP on 08/08/2019.  He tolerated the procedure well was stable postoperatively.  He remained under observation overnight and continuous bladder irrigation was weaned.  He was discharged home the following day in stable condition.  Consults: None  Significant Diagnostic Studies: labs: Renal function stable to slightly improved  Treatments: surgery: As above  Discharge Exam: Blood pressure 111/63, pulse 61, temperature (!) 97.3 F (36.3 C), temperature source Oral, resp. rate 16, height _0  (1.905 m), weight 95.3 kg, SpO2 97 %. General appearance: alert no acute distress Foley catheter draining light pink urine  Disposition: Discharge disposition: 01-Home or Self Care        Allergies as of 08/09/2019      Reactions   Hydrocodone Hives, Itching   Percocet [oxycodone-acetaminophen] Hives, Itching      Medication List    TAKE these medications   acetaminophen 500 MG tablet Commonly known as: TYLENOL Take 500-1,000 mg by mouth every 6 (six) hours as needed for headache (pain).   blood glucose meter kit and supplies Kit Dispense based on patient and insurance preference. Use up to four times daily as directed. (FOR ICD-9 250.00, 250.01).   carvedilol 6.25 MG tablet Commonly known as: COREG TAKE 1 TABLET BY MOUTH TWICE DAILY WITH MEALS What changed: how much to take Notes to patient: 08/09/2019 evening dose    carvedilol 12.5 MG tablet Commonly known as: COREG Take 12.5 mg by mouth 2 (two) times daily. What changed: Another medication with the same name was changed. Make sure you understand how and when to take each.   esomeprazole 40 MG capsule Commonly  known as: NexIUM Take 1 capsule (40 mg total) by mouth daily at 12 noon.   famotidine 20 MG tablet Commonly known as: PEPCID Take 1 tablet (20 mg total) by mouth daily. What changed:   when to take this  reasons to take this   furosemide 80 MG tablet Commonly known as: LASIX Take 80 mg by mouth 2 (two) times daily. Notes to patient: 08/09/2019 evening dose    gabapentin 300 MG capsule Commonly known as: NEURONTIN Take 1 capsule (300 mg total) by mouth at bedtime. Notes to patient: 08/09/2019 bedtime    hydrocortisone 2.5 % cream Apply topically 2 (two) times daily. What changed:   how much to take  when to take this  reasons to take this   insulin detemir 100 UNIT/ML injection Commonly known as: Levemir Inject 0.05 mLs (5 Units total) into the skin daily. Notes to patient: 08/09/2019    Insulin Syringes (Disposable) U-100 0.5 ML Misc Use as per instructions 3 times daily AC.   lipase/protease/amylase 36000 UNITS Cpep capsule Commonly known as: Creon Take 1 capsule with each snack and meal daily - up to two snacks daily. What changed:   how much to take  how to take this  when to take this Notes to patient: 08/09/2019 meals    loperamide 2 MG capsule Commonly known as: IMODIUM Take 1 capsule (2 mg total) by mouth as needed for diarrhea or loose stools. What changed:   how much to take  when to take this   Replesta 1.25 MG (50000 UT) Wafr Generic drug: Cholecalciferol Take 50,000 Units by  mouth every Tuesday.   tamsulosin 0.4 MG Caps capsule Commonly known as: FLOMAX Take 0.8 mg by mouth daily. Notes to patient: 08/09/2019 evening    traMADol 50 MG tablet Commonly known as: Ultram Take 1 tablet (50 mg total) by mouth every 6 (six) hours as needed.        Signed: Marton Redwood, III 08/09/2019, 5:38 PM

## 2019-08-10 ENCOUNTER — Ambulatory Visit: Payer: Self-pay | Admitting: Podiatry

## 2019-08-15 ENCOUNTER — Telehealth: Payer: Self-pay | Admitting: Gastroenterology

## 2019-08-15 NOTE — Telephone Encounter (Signed)
[name] wife called to inform that pt has been experiencing nausea and weakness. He thinks that creon is causing those side effects. Pt reports that sxs began aprox. 1 week ago.

## 2019-08-15 NOTE — Telephone Encounter (Signed)
The pt is complaining of nausea and fatigue.  He recently began taking Creon and thinks that is the cause.  Please advise

## 2019-08-15 NOTE — Telephone Encounter (Signed)
I don't normally associate the Creon with causing these symptoms. He also just went and had a TURP for his BPH within the last week. I would say he can hold the Creon for a week or two and see how he does, but he should update his Urologist about his symptoms as well, to ensure there is nothing from recent interventions, though it was a week ago. Please let him call us back in the next couple of weeks to let us know how he is doing. Set up a follow up in clinic in 4-6 weeks. Thanks. GM

## 2019-08-16 NOTE — Telephone Encounter (Signed)
Left message on machine to call back  

## 2019-08-16 NOTE — Telephone Encounter (Signed)
The pt has been advised and appt made for follow up. He has been advised to call urology as well.  He states he does have an appt already scheduled.

## 2019-08-29 ENCOUNTER — Encounter: Payer: Self-pay | Admitting: Family Medicine

## 2019-08-30 MED ORDER — GABAPENTIN 300 MG PO CAPS: 300.0000 mg | ORAL_CAPSULE | Freq: Every day | ORAL | 1 refills | Status: DC

## 2019-09-27 ENCOUNTER — Ambulatory Visit: Payer: BC Managed Care – PPO | Admitting: Gastroenterology

## 2019-09-27 ENCOUNTER — Encounter: Payer: Self-pay | Admitting: Gastroenterology

## 2019-09-27 VITALS — BP 140/78 | HR 83 | Ht 75.0 in | Wt 190.8 lb

## 2019-09-27 DIAGNOSIS — R195 Other fecal abnormalities: Secondary | ICD-10-CM

## 2019-09-27 DIAGNOSIS — K8681 Exocrine pancreatic insufficiency: Secondary | ICD-10-CM

## 2019-09-27 DIAGNOSIS — Z86010 Personal history of colonic polyps: Secondary | ICD-10-CM | POA: Diagnosis not present

## 2019-09-27 DIAGNOSIS — K3189 Other diseases of stomach and duodenum: Secondary | ICD-10-CM

## 2019-09-27 DIAGNOSIS — K31A0 Gastric intestinal metaplasia, unspecified: Secondary | ICD-10-CM

## 2019-09-27 MED ORDER — PANCRELIPASE (LIP-PROT-AMYL) 36000-114000 UNITS PO CPEP: 36000.0000 [IU] | ORAL_CAPSULE | ORAL | 2 refills | Status: DC

## 2019-09-27 NOTE — Progress Notes (Signed)
[facility] VISIT   Primary Care Provider Leeanne Rio, MD [place] [address] [phone]  Patient Profile: Louis Butler is a 64 y.o. male with a pmh significant for diabetes, hypertension, ESRD on HD (recently initiated), hypertension, urinary retention, alcohol use disorder, tobacco use disorder, gastritis, gastric intestinal metaplasia, colon polyps (TA/SSP), EPI.  The patient presents to the [facility] for an evaluation and management of problem(s) noted below:  Problem List 1. Exocrine pancreatic insufficiency   2. Intestinal metaplasia of gastric mucosa   3. Loose bowel movements   4. History of colonic polyps     History of Present Illness Please see prior consultation note for full details of HPI.  Interval History The patient returns for scheduled follow-up.  Since our last interaction with the patient, his fecal elastase testing has returned showing evidence of concern for severe exocrine insufficiency.  We initiated Creon.  After a few days of being on Creon he felt that he was getting more nausea and potential vomiting although this was also around the.  Time that he had just had a prostate therapy.  Unfortunately, the patient's kidney disease has progressed to the point that he is now on hemodialysis.  He just had his first 2 sessions on Saturday and then today.  He will be a Tuesday/Thursday/Saturday patient.  He is feeling significantly wiped out/fatigue.  The patient's bowel habits currently are slightly improved.  He still having some looseness to his bowel movements but they are not to the extent that they were previously.  He has a lot of Creon at home because he has not restarted.  Patient is not having any other significant GI issues at this time.  GI Review of Systems Positive as above Negative for odynophagia, dysphagia, pain, melena, hematochezia   Review of Systems General:  Denies fevers/chills/weight loss unintentionally Cardiovascular: Denies chest pain Pulmonary: Denies shortness of breath Gastroenterological: See HPI Genitourinary: Denies darkened urine Hematological: Denies easy bruising/bleeding Dermatological: Denies jaundice Psychological: Mood is anxious   Medications Current Outpatient Medications  Medication Sig Dispense Refill  . acetaminophen (TYLENOL) 500 MG tablet Take 500-1,000 mg by mouth every 6 (six) hours as needed for headache (pain).    . blood glucose meter kit and supplies KIT Dispense based on patient and insurance preference. Use up to four times daily as directed. (FOR ICD-9 250.00, 250.01). 1 each 0  . carvedilol (COREG) 12.5 MG tablet Take 12.5 mg by mouth 2 (two) times daily.    . carvedilol (COREG) 6.25 MG tablet TAKE 1 TABLET BY MOUTH TWICE DAILY WITH MEALS (Patient taking differently: Take 12.5 mg by mouth 2 (two) times daily with a meal. ) 180 tablet 2  . esomeprazole (NEXIUM) 40 MG capsule Take 1 capsule (40 mg total) by mouth daily at 12 noon. 60 capsule 3  . furosemide (LASIX) 80 MG tablet Take 80 mg by mouth 2 (two) times daily.     Marland Kitchen gabapentin (NEURONTIN) 300 MG capsule Take 1 capsule (300 mg total) by mouth at bedtime. 90 capsule 1  . hydrocortisone 2.5 % cream Apply topically 2 (two) times daily. (Patient taking differently: Apply 1 application topically 2 (two) times daily as needed (rash/skin irritation.). ) 30 g 0  . insulin detemir (LEVEMIR) 100 UNIT/ML injection Inject 0.05 mLs (5 Units total) into the skin daily.    . Insulin Syringes, Disposable, U-100 0.5 ML MISC Use as per instructions 3 times daily AC. 100 each  1  . lipase/protease/amylase (CREON) 36000 UNITS CPEP capsule Take 1 capsule (36,000 Units total) by mouth See admin instructions. Take 1 capsule with each snack and meal daily - up to two snacks daily. 240 capsule 2  . loperamide (IMODIUM) 2 MG capsule Take 1 capsule (2 mg total) by mouth as needed for  diarrhea or loose stools. (Patient taking differently: Take 2-4 mg by mouth 4 (four) times daily as needed for diarrhea or loose stools. ) 30 capsule 0  . tamsulosin (FLOMAX) 0.4 MG CAPS capsule Take 0.8 mg by mouth daily.    . famotidine (PEPCID) 20 MG tablet Take 1 tablet (20 mg total) by mouth daily. (Patient not taking: Reported on 09/27/2019) 30 tablet 0   Current Facility-Administered Medications  Medication Dose Route Frequency Provider Last Rate Last Admin  . 0.9 %  sodium chloride infusion  500 mL Intravenous Once Mansouraty, Telford Nab., MD        Allergies Allergies  Allergen Reactions  . Hydrocodone Hives and Itching  . Percocet [Oxycodone-Acetaminophen] Hives and Itching    Histories Past Medical History:  Diagnosis Date  . ARF (acute renal failure) ([place]) 03/14/2019  . Cellulitis 03/14/2019   feet  . Diabetes mellitus without complication ([place])   . Heart murmur   . Hypertension   . Irregular heart beat   . UTI (urinary tract infection)    Past Surgical History:  Procedure Laterality Date  . arm surgery Right    was cut to the bone  . TRANSURETHRAL RESECTION OF PROSTATE N/A 08/08/2019   Procedure: TRANSURETHRAL RESECTION OF THE PROSTATE (TURP);  Surgeon: Lucas Mallow, MD;  Location: [facility];  Service: Urology;  Laterality: N/A;  . WISDOM TOOTH EXTRACTION     Social History   Socioeconomic History  . Marital status: Married    Spouse name: Suanne Marker  . Number of children: 3  . Years of education: Not on file  . Highest education level: Not on file  Occupational History  . Occupation: Investment banker, operational [employer]  Tobacco Use  . Smoking status: Current Every Day Smoker    Packs/day: 1.00    Types: Cigarettes  . Smokeless tobacco: Never Used  Vaping Use  . Vaping Use: Never used  Substance and Sexual Activity  . Alcohol use: Not Currently    Comment: 12 pack a week, couple beers a day; nothing since December 2020.  Marland Kitchen Drug use: No  . Sexual  activity: Not on file  Other Topics Concern  . Not on file  Social History Narrative  . Not on file   Social Determinants of Health   Financial Resource Strain:   . Difficulty of Paying Living Expenses:   Food Insecurity:   . Worried About Charity fundraiser in the Last Year:   . Arboriculturist in the Last Year:   Transportation Needs:   . Film/video editor (Medical):   Marland Kitchen Lack of Transportation (Non-Medical):   Physical Activity:   . Days of Exercise per Week:   . Minutes of Exercise per Session:   Stress:   . Feeling of Stress :   Social Connections:   . Frequency of Communication with Friends and Family:   . Frequency of Social Gatherings with Friends and Family:   . Attends Religious Services:   . Active Member of Clubs or Organizations:   . Attends Archivist Meetings:   Marland Kitchen Marital Status:   Intimate Partner Violence:   .  Fear of Current or Ex-Partner:   . Emotionally Abused:   Marland Kitchen Physically Abused:   . Sexually Abused:    Family History  Problem Relation Age of Onset  . Diabetes type II Mother   . Lung cancer Mother   . Hypertension Mother   . Diabetes type II Father   . Diabetes type II Sister   . Diabetes type II Brother   . Hypertension Brother   . Other Son        burned  . CAD Neg Hx   . Colon cancer Neg Hx   . Esophageal cancer Neg Hx   . Inflammatory bowel disease Neg Hx   . Liver disease Neg Hx   . Pancreatic cancer Neg Hx   . Rectal cancer Neg Hx   . Stomach cancer Neg Hx    I have reviewed his medical, social, and family history in detail and updated the electronic medical record as necessary.    PHYSICAL EXAMINATION  BP 140/78 (BP Location: Left Arm, Patient Position: Sitting)   Pulse 83   Ht 6' 3"  (1.905 m)   Wt 190 lb 12.8 oz (86.5 kg)   SpO2 96%   BMI 23.85 kg/m  Wt Readings from Last 3 Encounters:  09/27/19 190 lb 12.8 oz (86.5 kg)  08/08/19 210 lb (95.3 kg)  08/04/19 (P) 202 lb 2 oz (91.7 kg)  GEN: NAD, appears  older than stated age, nontoxic PSYCH: Cooperative, without pressured speech EYE: Conjunctivae pink, sclerae anicteric ENT: MMM CV: Nontachycardic RESP: No audible wheezing GI: NABS, soft, small ventral diastases noted, NT, without rebound MSK/EXT: 1+ bilateral pedal edema present SKIN: No jaundice NEURO:  Alert & Oriented x 3, no focal deficits   REVIEW OF DATA  I reviewed the following data at the time of this encounter:  GI Procedures and Studies  March 2021 EGD - No gross lesions in esophagus. - Z-line irregular, 41 cm from the incisors. - Erosive gastropathy with no bleeding and no stigmata of recent bleeding. - No other gross lesions in the stomach. Biopsied. - No gross lesions in the duodenal bulb, in the first portion of the duodenum and in the second portion of the duodenum. Biopsied.  March 2021 colonoscopy - Hemorrhoids found on digital rectal exam. - The examined portion of the ileum was normal. Biopsied. - Ten 2 to 10 mm polyps in the transverse colon, in the ascending colon and at the ileocecal valve, removed with a cold snare. Resected and retrieved. - A single colonic angioectasia. - Diverticulosis in the recto-sigmoid colon and in the sigmoid colon. - Normal mucosa in the entire examined colon. Biopsied. - Non-bleeding non-thrombosed internal hemorrhoids.  Pathology Diagnosis 1. Surgical [P], duodenal biopsies - DUODENAL MUCOSA WITH NO SPECIFIC HISTOPATHOLOGIC CHANGES - NEGATIVE FOR INCREASED INTRAEPITHELIAL LYMPHOCYTES OR VILLOUS ARCHITECTURAL CHANGES 2. Surgical [P], random gastric biopsies - GASTRIC ANTRAL AND OXYNTIC MUCOSA WITH CHRONIC GASTRITIS, ATROPHY AND MULTIFOCAL INTESTINAL METAPLASIA. SEE NOTE - NEGATIVE FOR DYSPLASIA - WARTHIN STARRY STAIN IS NEGATIVE FOR HELICOBACTER PYLORI 3. Surgical [P], small bowel, terminal ileum - ILEAL MUCOSA WITH NO SPECIFIC HISTOPATHOLOGIC CHANGES - NEGATIVE FOR ACUTE INFLAMMATION, FEATURES OF CHRONICITY OR  GRANULOMAS 4. Surgical [P], random colon biopsies - COLONIC MUCOSA WITH NO SPECIFIC HISTOPATHOLOGIC CHANGES - NEGATIVE FOR ACUTE INFLAMMATION, INCREASED INTRAEPITHELIAL LYMPHOCYTES OR THICKENED SUBEPITHELIAL COLLAGEN TABLE 5. Surgical [P], ascending, ileocecal valve, transverse, polyp (9) - TUBULAR ADENOMA(S) WITHOUT HIGH-GRADE DYSPLASIA OR MALIGNANCY - SESSILE SERRATED POLYP(S) WITHOUT CYTOLOGIC DYSPLASIA - HYPERPLASTIC POLYP(S)  Diagnosis Note 2. Differential diagnosis can include chronic atrophic autoimmune gastritis.  Laboratory Studies  Reviewed those in epic Negative for acute hepatitis serologies in 2017 and 2020  Imaging Studies  No new imaging studies to review   ASSESSMENT  Mr. Vallery is a 64 y.o. male with a pmh significant for diabetes, hypertension, ESRD on HD (recently initiated), hypertension, urinary retention, alcohol use disorder, tobacco use disorder, gastritis, gastric intestinal metaplasia, colon polyps (TA/SSP), EPI.  The patient is seen today for evaluation and management of:  1. Exocrine pancreatic insufficiency   2. Intestinal metaplasia of gastric mucosa   3. Loose bowel movements   4. History of colonic polyps    The patient is hemodynamically and clinically stable from a GI perspective.  We diagnosed him with EPI based on low fecal elastase testing in the setting of his loose bowel movements.  He was initiated on Creon but was unable to continue taking it due to concern of potential nausea symptoms.  However, I suspect his nausea symptoms were likely a result of other systemic issues.  He is now on hemodialysis.  I told him over the course the next few weeks after he has become adjusted to his hemodialysis schedule that he should reinitiate Creon.  He can start by taking one 36,000 unit of lipase with each meal and with each snack during the course the day and slowly uptitrate that to 72,000 units with each meal at a minimum and 6 we will then be able to adjust  things based on his weight-based dosing and his symptom management.  Patient has gastric intestinal metaplasia and will need a EGD for surveillance and mapping in the setting of potential risk factor for development of gastric cancer in the future.  He will continue his fiber supplementation for now and we will see how his bowel habits change.  A surveillance colonoscopy is recommended in 3 years due to his history of multiple polyps.  All patient questions were answered, to the best of my ability, and the patient agrees to the aforementioned plan of action with follow-up as indicated.   PLAN  Restart Creon 36,000 units with each meal and 1 with each snack and slowly uptitrate back to 72,000 units with each meal and 36,000 units with each snack Monitor bowel habits and continue with diarrhea diary FiberCon 1 to 2 pills daily EGD for gastric mapping in coming months for history of GIM Surveillance colonoscopy in 2024   No orders of the defined types were placed in this encounter.   New Prescriptions   No medications on file   Modified Medications   Modified Medication Previous Medication   LIPASE/PROTEASE/AMYLASE (CREON) 36000 UNITS CPEP CAPSULE lipase/protease/amylase (CREON) 36000 UNITS CPEP capsule      Take 1 capsule (36,000 Units total) by mouth See admin instructions. Take 1 capsule with each snack and meal daily - up to two snacks daily.    Take 1 capsule with each snack and meal daily - up to two snacks daily.    Planned Follow Up No follow-ups on file.   Total Time in Face-to-Face and in Coordination of Care for patient including independent/personal interpretation/review of prior testing, medical history, examination, medication adjustment, communicating results with the patient directly, and documentation with the EHR is greater than 25 minutes.   Justice Britain, MD [facility] Office # [phone]

## 2019-09-27 NOTE — Patient Instructions (Addendum)
It has been recommended to you by your physician that you have a(n) EGD completed. Per your request, we did not schedule the procedure(s) today. Please contact our office at [phone] should you decide to have the procedure completed. You will be scheduled for a pre-visit and procedure at that time.  Restart Creon as directed. 36,000mg   with each snack / 72,000 mg with each meal.  If you are age 64 or older, your body mass index should be between 23-30. Your Body mass index is 23.85 kg/m. If this is out of the aforementioned range listed, please consider follow up with your Primary Care Provider.  If you are age 12 or younger, your body mass index should be between 19-25. Your Body mass index is 23.85 kg/m. If this is out of the aformentioned range listed, please consider follow up with your Primary Care Provider.    Thank you for choosing me and [facility].  Dr. Rush Landmark

## 2019-09-29 DIAGNOSIS — Z860100 Personal history of colon polyps, unspecified: Secondary | ICD-10-CM | POA: Insufficient documentation

## 2019-10-12 ENCOUNTER — Ambulatory Visit: Payer: Self-pay | Admitting: Podiatry

## 2019-10-18 ENCOUNTER — Encounter: Payer: Self-pay | Admitting: Family Medicine

## 2019-10-18 ENCOUNTER — Other Ambulatory Visit: Payer: Self-pay

## 2019-10-18 ENCOUNTER — Ambulatory Visit (INDEPENDENT_AMBULATORY_CARE_PROVIDER_SITE_OTHER): Payer: BC Managed Care – PPO | Admitting: Family Medicine

## 2019-10-18 VITALS — BP 100/60 | HR 100 | Ht 75.0 in | Wt 191.6 lb

## 2019-10-18 DIAGNOSIS — R339 Retention of urine, unspecified: Secondary | ICD-10-CM

## 2019-10-18 DIAGNOSIS — K3189 Other diseases of stomach and duodenum: Secondary | ICD-10-CM

## 2019-10-18 DIAGNOSIS — E1129 Type 2 diabetes mellitus with other diabetic kidney complication: Secondary | ICD-10-CM | POA: Diagnosis not present

## 2019-10-18 DIAGNOSIS — K31A0 Gastric intestinal metaplasia, unspecified: Secondary | ICD-10-CM

## 2019-10-18 MED ORDER — CARBAMIDE PEROXIDE 6.5 % OT SOLN: 5.0000 [drp] | Freq: Two times a day (BID) | OTIC | 0 refills | Status: DC

## 2019-10-18 NOTE — Patient Instructions (Signed)
It was great to see you again today!  Go up on insulin to 6u daily  Follow up with me in 3 months, sooner if needed  Referring to eye doctor  Be well, Dr. Ardelia Mems

## 2019-10-18 NOTE — Progress Notes (Addendum)
    SUBJECTIVE:   HPI:   Louis Butler presents today for routine follow up.   Benign Prostatic Hyperplasia- Underwent TURP on 08/08/2019. Reports some improvement in urinary retention.   CKD-Started dialysis. Patient describes drop in blood pressure during dialysis.  Intestinal metaplasia - Reports continued nausea. Follows with GI. Planned EGD for surveillance and mapping   Diabetes-taking Levemir 5 units daily.  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   Exocrine pancreatic insufficiency-Tolerating creon. Previous concern of potential nausea symptoms, but this was likely due to other issues.    OBJECTIVE:   BP 100/60   Pulse 100   Ht 6\' 3"  (1.905 m)   Wt 191 lb 9.6 oz (86.9 kg)   SpO2 99%   BMI 23.95 kg/m   General: no acute distress, alert and cooperative. HEENT: normocephalic, atraumatic. Moderate cerumen in ear canals.   Heart: Regular rate and rhythm, no murmur Lungs: normal work of breathing, clear to ausculation bilaterally Neuro: Alert, normal speech, nonfocal  ASSESSMENT/PLAN:   Benign prostatic hyperplasia: urinary retention -improved with TURP. Continue clinical monitoring  Intestinal Metaplasia -following with GI. Planned EGD for surveillance and mapping next month.   Diabetes -routine monitoring -consider increase in levemir dosing (note: patient was previously intolerant of the 10 units)   Earwax buildup -earwax removal performed -carbamide peroxide 6.5% solution  Exocrine pancreatic insufficiency  -continue 36,000 unit Creon with meals and snacks, with plans to titrate up.    [place]

## 2019-10-19 NOTE — Assessment & Plan Note (Signed)
Last A1c 6.9 on 5/3. Will increase levemir to 6u daily given fasting sugars not at goal. Patient seems quite sensitive to insulin (previously did not tolerate 10u daily). Follow up in 3 months. Refer to for eye exam.

## 2019-10-19 NOTE — Assessment & Plan Note (Signed)
Following with GI. Planned EGD for surveillance and mapping next month.

## 2019-10-19 NOTE — Assessment & Plan Note (Signed)
Improved with TURP procedure. Continue to follow up with urology per their recommendations.

## 2019-10-19 NOTE — Progress Notes (Signed)
    SUBJECTIVE:   HPI:   [name] presents today for routine follow up.   Benign Prostatic Hyperplasia- Underwent TURP on 08/08/2019. Reports improvement in urinary retention. Has stopped flomax and no longer has foley catheter in place.  CKD-Started dialysis. Patient describes drop in blood pressure during dialysis. Feels generally tired, especially on days he has HD. He is on T/Th/Sat schedule.  Intestinal metaplasia - Reports continued nausea. Follows with GI. Planned EGD for surveillance and mapping   Diabetes-taking Levemir 5 units daily. Fasting sugars typically ranging in the 150+ range. No sugars lower than this.  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 R worse than L. Would like this treated today if possible.  OBJECTIVE:   BP 100/60   Pulse 100   Ht 6\' 3"  (1.905 m)   Wt 191 lb 9.6 oz (86.9 kg)   SpO2 99%   BMI 23.95 kg/m   General: no acute distress, alert and cooperative. HEENT: normocephalic, atraumatic. Moderate cerumen in ear canals.   Heart: Regular rate and rhythm, no murmur Lungs: normal work of breathing, clear to ausculation bilaterally Neuro: Alert, normal speech, nonfocal grossly  ASSESSMENT/PLAN:   Cerumen impaction - attempted ear irrigation today, however wax hard and stuck in place - rx debrox to soften wax. Follow up after a week or so for repeat attempt at irrigation if needed.  Urinary retention Improved with TURP procedure. Continue to follow up with urology per their recommendations.   Intestinal metaplasia of gastric mucosa Following with GI. Planned EGD for surveillance and mapping next month.   Diabetes mellitus (HCC) Last A1c 6.9 on 5/3. Will increase levemir to 6u daily given fasting sugars not at goal. Patient seems quite sensitive to insulin (previously did not tolerate 10u daily). Follow up in 3 months. Refer to for eye exam.   Louis Butler, [place]   Patient seen along with MS3 student Louis Butler. I personally evaluated this patient along with the student, and verified all aspects of the history, physical exam, and medical decision making as documented by the student. I agree with the student's documentation and have made all necessary edits.  Louis Netters, MD  [place]

## 2019-11-01 ENCOUNTER — Other Ambulatory Visit: Payer: Self-pay

## 2019-11-01 DIAGNOSIS — N179 Acute kidney failure, unspecified: Secondary | ICD-10-CM

## 2019-11-10 ENCOUNTER — Other Ambulatory Visit: Payer: Self-pay

## 2019-11-10 DIAGNOSIS — N179 Acute kidney failure, unspecified: Secondary | ICD-10-CM

## 2019-11-14 ENCOUNTER — Other Ambulatory Visit: Payer: Self-pay

## 2019-11-14 ENCOUNTER — Ambulatory Visit: Payer: BC Managed Care – PPO | Admitting: Surgery

## 2019-11-14 ENCOUNTER — Encounter: Payer: Self-pay | Admitting: Surgery

## 2019-11-14 ENCOUNTER — Ambulatory Visit (HOSPITAL_COMMUNITY)
Admission: RE | Admit: 2019-11-14 | Discharge: 2019-11-14 | Disposition: A | Discharge status: Home / Self Care | Payer: BC Managed Care – PPO | Source: Ambulatory Visit | Attending: Surgery | Admitting: Surgery

## 2019-11-14 ENCOUNTER — Ambulatory Visit (INDEPENDENT_AMBULATORY_CARE_PROVIDER_SITE_OTHER)
Admission: RE | Admit: 2019-11-14 | Discharge: 2019-11-14 | Disposition: A | Discharge status: Home / Self Care | Payer: BC Managed Care – PPO | Source: Ambulatory Visit | Attending: Surgery | Admitting: Surgery

## 2019-11-14 VITALS — BP 182/64 | HR 43 | Temp 97.9°F | Resp 20 | Ht 75.0 in | Wt 193.9 lb

## 2019-11-14 DIAGNOSIS — Z992 Dependence on renal dialysis: Secondary | ICD-10-CM | POA: Diagnosis not present

## 2019-11-14 DIAGNOSIS — N186 End stage renal disease: Secondary | ICD-10-CM | POA: Diagnosis not present

## 2019-11-14 DIAGNOSIS — N179 Acute kidney failure, unspecified: Secondary | ICD-10-CM | POA: Insufficient documentation

## 2019-11-14 NOTE — Progress Notes (Signed)
Vascular and Vein Specialist of [facility]  Patient name: Louis Butler MRN: [id number] DOB: June 22, 1955 Sex: male   REQUESTING PROVIDER:    Dr. Detterding   REASON FOR CONSULT:    Dialysis access  HISTORY OF PRESENT ILLNESS:   Louis Butler is a 64 y.o. male, who is referred for dialysis access.  He has been on dialysis for approximately 2 months via a catheter.  He is right-handed.  His renal failure secondary to diabetes and hypertension.  He is a former smoker.  PAST MEDICAL HISTORY    Past Medical History:  Diagnosis Date  . ARF (acute renal failure) ([place]) 03/14/2019  . Cellulitis 03/14/2019   feet  . Diabetes mellitus without complication ([place])   . Heart murmur   . Hypertension   . Irregular heart beat   . UTI (urinary tract infection)      FAMILY HISTORY   Family History  Problem Relation Age of Onset  . Diabetes type II Mother   . Lung cancer Mother   . Hypertension Mother   . Diabetes type II Father   . Diabetes type II Sister   . Diabetes type II Brother   . Hypertension Brother   . Other Son        burned  . CAD Neg Hx   . Colon cancer Neg Hx   . Esophageal cancer Neg Hx   . Inflammatory bowel disease Neg Hx   . Liver disease Neg Hx   . Pancreatic cancer Neg Hx   . Rectal cancer Neg Hx   . Stomach cancer Neg Hx     SOCIAL HISTORY:   Social History   Socioeconomic History  . Marital status: Married    Spouse name: Suanne Marker  . Number of children: 3  . Years of education: Not on file  . Highest education level: Not on file  Occupational History  . Occupation: Investment banker, operational [employer]  Tobacco Use  . Smoking status: Former Smoker    Packs/day: 1.00    Types: Cigarettes    Quit date: 03/08/2019    Years since quitting: 0.6  . Smokeless tobacco: Never Used  Vaping Use  . Vaping Use: Never used  Substance and Sexual Activity  . Alcohol use: Not Currently    Comment: 12 pack a week, couple  beers a day; nothing since December 2020.  Marland Kitchen Drug use: No  . Sexual activity: Not on file  Other Topics Concern  . Not on file  Social History Narrative  . Not on file   Social Determinants of Health   Financial Resource Strain:   . Difficulty of Paying Living Expenses:   Food Insecurity:   . Worried About Charity fundraiser in the Last Year:   . Arboriculturist in the Last Year:   Transportation Needs:   . Film/video editor (Medical):   Marland Kitchen Lack of Transportation (Non-Medical):   Physical Activity:   . Days of Exercise per Week:   . Minutes of Exercise per Session:   Stress:   . Feeling of Stress :   Social Connections:   . Frequency of Communication with Friends and Family:   . Frequency of Social Gatherings with Friends and Family:   . Attends Religious Services:   . Active Member of Clubs or Organizations:   . Attends Archivist Meetings:   Marland Kitchen Marital Status:   Intimate Partner Violence:   . Fear of Current or Ex-Partner:   .  Emotionally Abused:   Marland Kitchen Physically Abused:   . Sexually Abused:     ALLERGIES:    Allergies  Allergen Reactions  . Hydrocodone Hives and Itching  . Percocet [Oxycodone-Acetaminophen] Hives and Itching    CURRENT MEDICATIONS:    Current Outpatient Medications  Medication Sig Dispense Refill  . acetaminophen (TYLENOL) 500 MG tablet Take 500-1,000 mg by mouth every 6 (six) hours as needed for headache (pain).    . blood glucose meter kit and supplies KIT Dispense based on patient and insurance preference. Use up to four times daily as directed. (FOR ICD-9 250.00, 250.01). 1 each 0  . carbamide peroxide (DEBROX) 6.5 % OTIC solution Place 5 drops into both ears 2 (two) times daily. 15 mL 0  . esomeprazole (NEXIUM) 40 MG capsule Take 1 capsule (40 mg total) by mouth daily at 12 noon. 60 capsule 3  . gabapentin (NEURONTIN) 300 MG capsule Take 1 capsule (300 mg total) by mouth at bedtime. 90 capsule 1  . hydrocortisone 2.5 % cream  Apply topically 2 (two) times daily. (Patient taking differently: Apply 1 application topically 2 (two) times daily as needed (rash/skin irritation.). ) 30 g 0  . insulin detemir (LEVEMIR) 100 UNIT/ML injection Inject 0.05 mLs (5 Units total) into the skin daily.    . Insulin Syringes, Disposable, U-100 0.5 ML MISC Use as per instructions 3 times daily AC. 100 each 1  . lipase/protease/amylase (CREON) 36000 UNITS CPEP capsule Take 1 capsule (36,000 Units total) by mouth See admin instructions. Take 1 capsule with each snack and meal daily - up to two snacks daily. 240 capsule 2  . famotidine (PEPCID) 20 MG tablet Take 1 tablet (20 mg total) by mouth daily. (Patient not taking: Reported on 09/27/2019) 30 tablet 0   Current Facility-Administered Medications  Medication Dose Route Frequency Provider Last Rate Last Admin  . 0.9 %  sodium chloride infusion  500 mL Intravenous Once Mansouraty, Telford Nab., MD        REVIEW OF SYSTEMS:   [X]  denotes positive finding, [ ]  denotes negative finding Cardiac  Comments:  Chest pain or chest pressure:    Shortness of breath upon exertion:    Short of breath when lying flat:    Irregular heart rhythm:        Vascular    Pain in calf, thigh, or hip brought on by ambulation: x   Pain in feet at night that wakes you up from your sleep:     Blood clot in your veins:    Leg swelling:         Pulmonary    Oxygen at home:    Productive cough:     Wheezing:         Neurologic    Sudden weakness in arms or legs:     Sudden numbness in arms or legs:     Sudden onset of difficulty speaking or slurred speech:    Temporary loss of vision in one eye:     Problems with dizziness:         Gastrointestinal    Blood in stool:      Vomited blood:         Genitourinary    Burning when urinating:     Blood in urine:        Psychiatric    Major depression:         Hematologic    Bleeding problems:    Problems with blood clotting  too easily:          Skin    Rashes or ulcers:        Constitutional    Fever or chills:     PHYSICAL EXAM:   Vitals:   11/14/19 1142  BP: (!) 182/64  Pulse: (!) 43  Resp: 20  Temp: 97.9 F (36.6 C)  SpO2: 98%  Weight: 193 lb 14.4 oz (88 kg)  Height: 6' 3"  (1.905 m)    GENERAL: The patient is a well-nourished male, in no acute distress. The vital signs are documented above. CARDIAC: There is a regular rate and rhythm.  VASCULAR: Palpable left radial pulse PULMONARY: Nonlabored respirations MUSCULOSKELETAL: There are no major deformities or cyanosis. NEUROLOGIC: No focal weakness or paresthesias are detected. SKIN: There are no ulcers or rashes noted. PSYCHIATRIC: The patient has a normal affect.  STUDIES:   I have reviewed the following: +-----------------+-------------+----------+--------------+  Left Cephalic  Diameter (cm)Depth (cm)  Findings    +-----------------+-------------+----------+--------------+  Shoulder                 not visualized  +-----------------+-------------+----------+--------------+  Prox upper arm              not visualized  +-----------------+-------------+----------+--------------+  Mid upper arm              not visualized  +-----------------+-------------+----------+--------------+  Dist upper arm              not visualized  +-----------------+-------------+----------+--------------+  Antecubital fossa            not visualized  +-----------------+-------------+----------+--------------+  Prox forearm               not visualized  +-----------------+-------------+----------+--------------+   +-----------------+-------------+----------+---------+  Left Basilic   Diameter (cm)Depth (cm)Findings   +-----------------+-------------+----------+---------+  Prox upper arm    0.66        branching   +-----------------+-------------+----------+---------+  Mid upper arm    0.42               +-----------------+-------------+----------+---------+  Dist upper arm    0.55               +-----------------+-------------+----------+---------+  Antecubital fossa  0.16        branching  +-----------------+-------------+----------+---------+   Artery: Right: No obstruction visualized in the right upper extremity.  Left: No obstruction visualized in the left upper extremity.    ASSESSMENT and PLAN   End-stage renal disease: The patient has had prior surgery to his right arm and he is right-handed.  Therefore think his best option is a left arm fistula.  His cephalic vein was not visualized.  He does have an excellent left basilic vein.  I discussed doing his operation in 2 stages.  He will undergo for staged basilic vein fistula on Friday, August 13.  I discussed the risks and benefits as well as the details of the procedure including but not limited to the risk of not maturity, the need for second operation, the risk of steal syndrome.  All of his questions were answered.   Leia Alf, MD, FACS Vascular and Vein Specialists of [facility] [phone] Pager [phone]

## 2019-11-14 NOTE — H&P (View-Only) (Signed)
Vascular and Vein Specialist of [facility]  Patient name: Louis Butler MRN: [id number] DOB: [birth year] Sex: male   REQUESTING PROVIDER:    Dr. Detterding   REASON FOR CONSULT:    Dialysis access  HISTORY OF PRESENT ILLNESS:   Louis Butler is a 64 y.o. male, who is referred for dialysis access.  He has been on dialysis for approximately 2 months via a catheter.  He is right-handed.  His renal failure secondary to diabetes and hypertension.  He is a former smoker.  PAST MEDICAL HISTORY    Past Medical History:  Diagnosis Date  . ARF (acute renal failure) ([place]) 03/14/2019  . Cellulitis 03/14/2019   feet  . Diabetes mellitus without complication ([place])   . Heart murmur   . Hypertension   . Irregular heart beat   . UTI (urinary tract infection)      FAMILY HISTORY   Family History  Problem Relation Age of Onset  . Diabetes type II Mother   . Lung cancer Mother   . Hypertension Mother   . Diabetes type II Father   . Diabetes type II Sister   . Diabetes type II Brother   . Hypertension Brother   . Other Son        burned  . CAD Neg Hx   . Colon cancer Neg Hx   . Esophageal cancer Neg Hx   . Inflammatory bowel disease Neg Hx   . Liver disease Neg Hx   . Pancreatic cancer Neg Hx   . Rectal cancer Neg Hx   . Stomach cancer Neg Hx     SOCIAL HISTORY:   Social History   Socioeconomic History  . Marital status: Married    Spouse name: Louis Butler  . Number of children: 3  . Years of education: Not on file  . Highest education level: Not on file  Occupational History  . Occupation: Investment banker, operational [employer]  Tobacco Use  . Smoking status: Former Smoker    Packs/day: 1.00    Types: Cigarettes    Quit date: 03/08/2019    Years since quitting: 0.6  . Smokeless tobacco: Never Used  Vaping Use  . Vaping Use: Never used  Substance and Sexual Activity  . Alcohol use: Not Currently    Comment: 12 pack a week, couple  beers a day; nothing since December 2020.  Marland Kitchen Drug use: No  . Sexual activity: Not on file  Other Topics Concern  . Not on file  Social History Narrative  . Not on file   Social Determinants of Health   Financial Resource Strain:   . Difficulty of Paying Living Expenses:   Food Insecurity:   . Worried About Charity fundraiser in the Last Year:   . Arboriculturist in the Last Year:   Transportation Needs:   . Film/video editor (Medical):   Marland Kitchen Lack of Transportation (Non-Medical):   Physical Activity:   . Days of Exercise per Week:   . Minutes of Exercise per Session:   Stress:   . Feeling of Stress :   Social Connections:   . Frequency of Communication with Friends and Family:   . Frequency of Social Gatherings with Friends and Family:   . Attends Religious Services:   . Active Member of Clubs or Organizations:   . Attends Archivist Meetings:   Marland Kitchen Marital Status:   Intimate Partner Violence:   . Fear of Current or Ex-Partner:   .  Emotionally Abused:   Marland Kitchen Physically Abused:   . Sexually Abused:     ALLERGIES:    Allergies  Allergen Reactions  . Hydrocodone Hives and Itching  . Percocet [Oxycodone-Acetaminophen] Hives and Itching    CURRENT MEDICATIONS:    Current Outpatient Medications  Medication Sig Dispense Refill  . acetaminophen (TYLENOL) 500 MG tablet Take 500-1,000 mg by mouth every 6 (six) hours as needed for headache (pain).    . blood glucose meter kit and supplies KIT Dispense based on patient and insurance preference. Use up to four times daily as directed. (FOR ICD-9 250.00, 250.01). 1 each 0  . carbamide peroxide (DEBROX) 6.5 % OTIC solution Place 5 drops into both ears 2 (two) times daily. 15 mL 0  . esomeprazole (NEXIUM) 40 MG capsule Take 1 capsule (40 mg total) by mouth daily at 12 noon. 60 capsule 3  . gabapentin (NEURONTIN) 300 MG capsule Take 1 capsule (300 mg total) by mouth at bedtime. 90 capsule 1  . hydrocortisone 2.5 % cream  Apply topically 2 (two) times daily. (Patient taking differently: Apply 1 application topically 2 (two) times daily as needed (rash/skin irritation.). ) 30 g 0  . insulin detemir (LEVEMIR) 100 UNIT/ML injection Inject 0.05 mLs (5 Units total) into the skin daily.    . Insulin Syringes, Disposable, U-100 0.5 ML MISC Use as per instructions 3 times daily AC. 100 each 1  . lipase/protease/amylase (CREON) 36000 UNITS CPEP capsule Take 1 capsule (36,000 Units total) by mouth See admin instructions. Take 1 capsule with each snack and meal daily - up to two snacks daily. 240 capsule 2  . famotidine (PEPCID) 20 MG tablet Take 1 tablet (20 mg total) by mouth daily. (Patient not taking: Reported on 09/27/2019) 30 tablet 0   Current Facility-Administered Medications  Medication Dose Route Frequency Provider Last Rate Last Admin  . 0.9 %  sodium chloride infusion  500 mL Intravenous Once Mansouraty, Telford Nab., MD        REVIEW OF SYSTEMS:   [X]  denotes positive finding, [ ]  denotes negative finding Cardiac  Comments:  Chest pain or chest pressure:    Shortness of breath upon exertion:    Short of breath when lying flat:    Irregular heart rhythm:        Vascular    Pain in calf, thigh, or hip brought on by ambulation: x   Pain in feet at night that wakes you up from your sleep:     Blood clot in your veins:    Leg swelling:         Pulmonary    Oxygen at home:    Productive cough:     Wheezing:         Neurologic    Sudden weakness in arms or legs:     Sudden numbness in arms or legs:     Sudden onset of difficulty speaking or slurred speech:    Temporary loss of vision in one eye:     Problems with dizziness:         Gastrointestinal    Blood in stool:      Vomited blood:         Genitourinary    Burning when urinating:     Blood in urine:        Psychiatric    Major depression:         Hematologic    Bleeding problems:    Problems with blood clotting  too easily:         Skin    Rashes or ulcers:        Constitutional    Fever or chills:     PHYSICAL EXAM:   Vitals:   11/14/19 1142  BP: (!) 182/64  Pulse: (!) 43  Resp: 20  Temp: 97.9 F (36.6 C)  SpO2: 98%  Weight: 193 lb 14.4 oz (88 kg)  Height: 6' 3"  (1.905 m)    GENERAL: The patient is a well-nourished male, in no acute distress. The vital signs are documented above. CARDIAC: There is a regular rate and rhythm.  VASCULAR: Palpable left radial pulse PULMONARY: Nonlabored respirations MUSCULOSKELETAL: There are no major deformities or cyanosis. NEUROLOGIC: No focal weakness or paresthesias are detected. SKIN: There are no ulcers or rashes noted. PSYCHIATRIC: The patient has a normal affect.  STUDIES:   I have reviewed the following: +-----------------+-------------+----------+--------------+  Left Cephalic  Diameter (cm)Depth (cm)  Findings    +-----------------+-------------+----------+--------------+  Shoulder                 not visualized  +-----------------+-------------+----------+--------------+  Prox upper arm              not visualized  +-----------------+-------------+----------+--------------+  Mid upper arm              not visualized  +-----------------+-------------+----------+--------------+  Dist upper arm              not visualized  +-----------------+-------------+----------+--------------+  Antecubital fossa            not visualized  +-----------------+-------------+----------+--------------+  Prox forearm               not visualized  +-----------------+-------------+----------+--------------+   +-----------------+-------------+----------+---------+  Left Basilic   Diameter (cm)Depth (cm)Findings   +-----------------+-------------+----------+---------+  Prox upper arm    0.66        branching   +-----------------+-------------+----------+---------+  Mid upper arm    0.42               +-----------------+-------------+----------+---------+  Dist upper arm    0.55               +-----------------+-------------+----------+---------+  Antecubital fossa  0.16        branching  +-----------------+-------------+----------+---------+   Artery: Right: No obstruction visualized in the right upper extremity.  Left: No obstruction visualized in the left upper extremity.    ASSESSMENT and PLAN   End-stage renal disease: The patient has had prior surgery to his right arm and he is right-handed.  Therefore think his best option is a left arm fistula.  His cephalic vein was not visualized.  He does have an excellent left basilic vein.  I discussed doing his operation in 2 stages.  He will undergo for staged basilic vein fistula on Friday, August 13.  I discussed the risks and benefits as well as the details of the procedure including but not limited to the risk of not maturity, the need for second operation, the risk of steal syndrome.  All of his questions were answered.   Leia Alf, MD, FACS Vascular and Vein Specialists of [facility] [phone] Pager [phone]

## 2019-11-16 ENCOUNTER — Other Ambulatory Visit (HOSPITAL_COMMUNITY)
Admission: RE | Admit: 2019-11-16 | Discharge: 2019-11-16 | Disposition: A | Discharge status: Home / Self Care | Payer: BC Managed Care – PPO | Source: Ambulatory Visit | Attending: Surgery | Admitting: Surgery

## 2019-11-16 ENCOUNTER — Encounter: Payer: Self-pay | Admitting: Gastroenterology

## 2019-11-16 ENCOUNTER — Other Ambulatory Visit: Payer: Self-pay

## 2019-11-16 ENCOUNTER — Ambulatory Visit (AMBULATORY_SURGERY_CENTER): Payer: Self-pay | Admitting: *Deleted

## 2019-11-16 VITALS — Ht 75.0 in | Wt 197.0 lb

## 2019-11-16 DIAGNOSIS — K3189 Other diseases of stomach and duodenum: Secondary | ICD-10-CM

## 2019-11-16 DIAGNOSIS — Z01812 Encounter for preprocedural laboratory examination: Secondary | ICD-10-CM | POA: Diagnosis not present

## 2019-11-16 DIAGNOSIS — Z20822 Contact with and (suspected) exposure to covid-19: Secondary | ICD-10-CM | POA: Diagnosis not present

## 2019-11-16 DIAGNOSIS — K31A0 Gastric intestinal metaplasia, unspecified: Secondary | ICD-10-CM

## 2019-11-16 DIAGNOSIS — Z01818 Encounter for other preprocedural examination: Secondary | ICD-10-CM

## 2019-11-16 LAB — SARS CORONAVIRUS 2 (TAT 6-24 HRS): SARS Coronavirus 2: NEGATIVE

## 2019-11-16 NOTE — Progress Notes (Signed)
Patient is here in-person for PV. Patient denies any allergies to eggs or soy. Patient denies any problems with anesthesia/sedation. Patient denies any oxygen use at home. Patient denies taking any diet/weight loss medications or blood thinners. Patient is not being treated for MRSA or C-diff. Patient is aware of our care-partner policy and [id number] safety protocol.  Patient denies any changes in hx since he was here in March 2021. COVID-19 screening test is on 8/25, the pt is aware.

## 2019-11-17 ENCOUNTER — Encounter (HOSPITAL_COMMUNITY): Payer: Self-pay | Admitting: Surgery

## 2019-11-17 ENCOUNTER — Other Ambulatory Visit: Payer: Self-pay

## 2019-11-17 NOTE — Progress Notes (Signed)
Mr Cardarelli requested that I speak with his wife Gavin Potters.  Mr. Lorentz has not had chest pain or shortness of breath- per Mr. Cordts, call was on speaker- he would answer some questions. Patient tested negative for Covid_8/11/2021_ and has been in quarantine since that time.  Mr. Flink has type II diabetes, patient reports that CBGs run 120-170. I instructed patient to take 3 units of Levemir if CBG is > 70. I instructed patient to check CBG after awaking and every 2 hours until arrival  to the hospital.  I instructed patient if CBG is less than 70 drink 1/2 cup of a clear juice. Recheck CBG in 15 minutes then call pre- op desk at [phone] for further instructions.

## 2019-11-18 ENCOUNTER — Ambulatory Visit (HOSPITAL_COMMUNITY)
Admission: RE | Admit: 2019-11-18 | Discharge: 2019-11-18 | Disposition: A | Discharge status: Home / Self Care | Payer: BC Managed Care – PPO | Attending: Surgery | Admitting: Surgery

## 2019-11-18 ENCOUNTER — Ambulatory Visit (HOSPITAL_COMMUNITY): Payer: BC Managed Care – PPO | Admitting: Certified Registered Nurse Anesthetist

## 2019-11-18 ENCOUNTER — Encounter (HOSPITAL_COMMUNITY): Payer: Self-pay | Admitting: Surgery

## 2019-11-18 ENCOUNTER — Encounter (HOSPITAL_COMMUNITY)
Admission: RE | Disposition: A | Discharge status: Home / Self Care | Payer: Self-pay | Source: Home / Self Care | Attending: Surgery

## 2019-11-18 DIAGNOSIS — Z992 Dependence on renal dialysis: Secondary | ICD-10-CM | POA: Insufficient documentation

## 2019-11-18 DIAGNOSIS — N186 End stage renal disease: Secondary | ICD-10-CM | POA: Diagnosis not present

## 2019-11-18 DIAGNOSIS — Z79899 Other long term (current) drug therapy: Secondary | ICD-10-CM | POA: Diagnosis not present

## 2019-11-18 DIAGNOSIS — E1122 Type 2 diabetes mellitus with diabetic chronic kidney disease: Secondary | ICD-10-CM | POA: Diagnosis not present

## 2019-11-18 DIAGNOSIS — Z87891 Personal history of nicotine dependence: Secondary | ICD-10-CM | POA: Insufficient documentation

## 2019-11-18 DIAGNOSIS — Z794 Long term (current) use of insulin: Secondary | ICD-10-CM | POA: Insufficient documentation

## 2019-11-18 DIAGNOSIS — I120 Hypertensive chronic kidney disease with stage 5 chronic kidney disease or end stage renal disease: Secondary | ICD-10-CM | POA: Diagnosis not present

## 2019-11-18 DIAGNOSIS — N185 Chronic kidney disease, stage 5: Secondary | ICD-10-CM

## 2019-11-18 HISTORY — DX: Unspecified osteoarthritis, unspecified site: M19.90

## 2019-11-18 HISTORY — DX: Gastro-esophageal reflux disease without esophagitis: K21.9

## 2019-11-18 HISTORY — DX: Personal history of other medical treatment: Z92.89

## 2019-11-18 HISTORY — PX: BASCILIC VEIN TRANSPOSITION: SHX5742

## 2019-11-18 LAB — POCT I-STAT, CHEM 8
BUN: 24 mg/dL — ABNORMAL HIGH (ref 8–23)
Calcium, Ion: 1.1 mmol/L — ABNORMAL LOW (ref 1.15–1.40)
Chloride: 94 mmol/L — ABNORMAL LOW (ref 98–111)
Creatinine, Ser: 3.2 mg/dL — ABNORMAL HIGH (ref 0.61–1.24)
Glucose, Bld: 146 mg/dL — ABNORMAL HIGH (ref 70–99)
HCT: 36 % — ABNORMAL LOW (ref 39.0–52.0)
Hemoglobin: 12.2 g/dL — ABNORMAL LOW (ref 13.0–17.0)
Potassium: 4 mmol/L (ref 3.5–5.1)
Sodium: 136 mmol/L (ref 135–145)
TCO2: 29 mmol/L (ref 22–32)

## 2019-11-18 LAB — GLUCOSE, CAPILLARY
Glucose-Capillary: 131 mg/dL — ABNORMAL HIGH (ref 70–99)
Glucose-Capillary: 145 mg/dL — ABNORMAL HIGH (ref 70–99)

## 2019-11-18 SURGERY — TRANSPOSITION, VEIN, BASILIC
Anesthesia: Monitor Anesthesia Care | Site: Arm Upper | Laterality: Left

## 2019-11-18 MED ORDER — ONDANSETRON HCL 4 MG/2ML IJ SOLN: 4.0000 mg | Freq: Once | INTRAMUSCULAR | Status: DC | PRN

## 2019-11-18 MED ORDER — MEPERIDINE HCL 25 MG/ML IJ SOLN: 6.2500 mg | INTRAMUSCULAR | Status: DC | PRN

## 2019-11-18 MED ORDER — CHLORHEXIDINE GLUCONATE 4 % EX LIQD: 60.0000 mL | Freq: Once | CUTANEOUS | Status: DC

## 2019-11-18 MED ORDER — FENTANYL CITRATE (PF) 100 MCG/2ML IJ SOLN
INTRAMUSCULAR | Status: DC | PRN
  Administered 2019-11-18: 50 ug via INTRAVENOUS

## 2019-11-18 MED ORDER — SODIUM CHLORIDE 0.9 % IV SOLN
INTRAVENOUS | Status: DC | PRN
  Administered 2019-11-18: 08:00:00 500 mL

## 2019-11-18 MED ORDER — CARVEDILOL 12.5 MG PO TABS
ORAL_TABLET | ORAL | Status: AC
  Administered 2019-11-18: 12.5 mg via ORAL
  Filled 2019-11-18: qty 1

## 2019-11-18 MED ORDER — HYDROMORPHONE HCL 1 MG/ML IJ SOLN
INTRAMUSCULAR | Status: AC
  Filled 2019-11-18: qty 1

## 2019-11-18 MED ORDER — PROPOFOL 500 MG/50ML IV EMUL
INTRAVENOUS | Status: DC | PRN
  Administered 2019-11-18: 125 ug/kg/min via INTRAVENOUS

## 2019-11-18 MED ORDER — PHENYLEPHRINE 40 MCG/ML (10ML) SYRINGE FOR IV PUSH (FOR BLOOD PRESSURE SUPPORT)
PREFILLED_SYRINGE | INTRAVENOUS | Status: AC
  Filled 2019-11-18: qty 10

## 2019-11-18 MED ORDER — CHLORHEXIDINE GLUCONATE 0.12 % MT SOLN
15.0000 mL | Freq: Once | OROMUCOSAL | Status: AC
  Administered 2019-11-18: 15 mL via OROMUCOSAL
  Filled 2019-11-18: qty 15

## 2019-11-18 MED ORDER — HYDROMORPHONE HCL 1 MG/ML IJ SOLN
0.2500 mg | INTRAMUSCULAR | Status: DC | PRN
  Administered 2019-11-18 (×2): 0.5 mg via INTRAVENOUS

## 2019-11-18 MED ORDER — LIDOCAINE-EPINEPHRINE (PF) 1 %-1:200000 IJ SOLN
INTRAMUSCULAR | Status: AC
  Filled 2019-11-18: qty 30

## 2019-11-18 MED ORDER — CARVEDILOL 12.5 MG PO TABS
12.5000 mg | ORAL_TABLET | Freq: Once | ORAL | Status: AC
  Filled 2019-11-18: qty 1

## 2019-11-18 MED ORDER — LIDOCAINE-EPINEPHRINE (PF) 1 %-1:200000 IJ SOLN
INTRAMUSCULAR | Status: DC | PRN
  Administered 2019-11-18: 3 mL via INTRADERMAL

## 2019-11-18 MED ORDER — PROPOFOL 10 MG/ML IV BOLUS
INTRAVENOUS | Status: AC
  Filled 2019-11-18: qty 20

## 2019-11-18 MED ORDER — SODIUM CHLORIDE 0.9 % IV SOLN: INTRAVENOUS | Status: DC

## 2019-11-18 MED ORDER — FENTANYL CITRATE (PF) 250 MCG/5ML IJ SOLN
INTRAMUSCULAR | Status: AC
  Filled 2019-11-18: qty 5

## 2019-11-18 MED ORDER — PROPOFOL 1000 MG/100ML IV EMUL
INTRAVENOUS | Status: AC
  Filled 2019-11-18: qty 100

## 2019-11-18 MED ORDER — SODIUM CHLORIDE 0.9 % IV SOLN
INTRAVENOUS | Status: AC
  Filled 2019-11-18: qty 1.2

## 2019-11-18 MED ORDER — TRAMADOL HCL 50 MG PO TABS: 50.0000 mg | ORAL_TABLET | Freq: Four times a day (QID) | ORAL | 0 refills | Status: DC | PRN

## 2019-11-18 MED ORDER — PROPOFOL 10 MG/ML IV BOLUS
INTRAVENOUS | Status: DC | PRN
  Administered 2019-11-18: 20 mg via INTRAVENOUS

## 2019-11-18 MED ORDER — 0.9 % SODIUM CHLORIDE (POUR BTL) OPTIME
TOPICAL | Status: DC | PRN
  Administered 2019-11-18: 1000 mL

## 2019-11-18 MED ORDER — ORAL CARE MOUTH RINSE: 15.0000 mL | Freq: Once | OROMUCOSAL | Status: AC

## 2019-11-18 MED ORDER — ONDANSETRON HCL 4 MG/2ML IJ SOLN
INTRAMUSCULAR | Status: DC | PRN
  Administered 2019-11-18: 4 mg via INTRAVENOUS

## 2019-11-18 MED ORDER — PHENYLEPHRINE HCL-NACL 10-0.9 MG/250ML-% IV SOLN
INTRAVENOUS | Status: DC | PRN
  Administered 2019-11-18: 50 ug/min via INTRAVENOUS

## 2019-11-18 MED ORDER — ONDANSETRON HCL 4 MG/2ML IJ SOLN
INTRAMUSCULAR | Status: AC
  Filled 2019-11-18: qty 2

## 2019-11-18 MED ORDER — CEFAZOLIN SODIUM-DEXTROSE 2-4 GM/100ML-% IV SOLN
2.0000 g | INTRAVENOUS | Status: AC
  Administered 2019-11-18: 2 g via INTRAVENOUS
  Filled 2019-11-18: qty 100

## 2019-11-18 MED ORDER — LIDOCAINE 2% (20 MG/ML) 5 ML SYRINGE
INTRAMUSCULAR | Status: AC
  Filled 2019-11-18: qty 5

## 2019-11-18 SURGICAL SUPPLY — 37 items
ADH SKN CLS APL DERMABOND .7 (GAUZE/BANDAGES/DRESSINGS) ×1
ARMBAND PINK RESTRICT EXTREMIT (MISCELLANEOUS) ×2 IMPLANT
CANISTER SUCT 3000ML PPV (MISCELLANEOUS) ×2 IMPLANT
CLIP VESOCCLUDE MED 24/CT (CLIP) IMPLANT
CLIP VESOCCLUDE MED 6/CT (CLIP) IMPLANT
CLIP VESOCCLUDE SM WIDE 24/CT (CLIP) ×1 IMPLANT
CLIP VESOCCLUDE SM WIDE 6/CT (CLIP) ×1 IMPLANT
COVER PROBE W GEL 5X96 (DRAPES) ×2 IMPLANT
COVER WAND RF STERILE (DRAPES) ×1 IMPLANT
DERMABOND ADVANCED (GAUZE/BANDAGES/DRESSINGS) ×1
DERMABOND ADVANCED .7 DNX12 (GAUZE/BANDAGES/DRESSINGS) ×1 IMPLANT
ELECT REM PT RETURN 9FT ADLT (ELECTROSURGICAL) ×2
ELECTRODE REM PT RTRN 9FT ADLT (ELECTROSURGICAL) ×1 IMPLANT
GLOVE BIOGEL PI IND STRL 7.0 (GLOVE) IMPLANT
GLOVE BIOGEL PI IND STRL 7.5 (GLOVE) ×1 IMPLANT
GLOVE BIOGEL PI INDICATOR 7.0 (GLOVE) ×1
GLOVE BIOGEL PI INDICATOR 7.5 (GLOVE) ×1
GLOVE SURG SS PI 7.5 STRL IVOR (GLOVE) ×2 IMPLANT
GOWN STRL REUS W/ TWL LRG LVL3 (GOWN DISPOSABLE) ×2 IMPLANT
GOWN STRL REUS W/ TWL XL LVL3 (GOWN DISPOSABLE) ×1 IMPLANT
GOWN STRL REUS W/TWL LRG LVL3 (GOWN DISPOSABLE) ×4
GOWN STRL REUS W/TWL XL LVL3 (GOWN DISPOSABLE) ×2
HEMOSTAT SNOW SURGICEL 2X4 (HEMOSTASIS) IMPLANT
KIT BASIN OR (CUSTOM PROCEDURE TRAY) ×2 IMPLANT
KIT TURNOVER KIT B (KITS) ×2 IMPLANT
LOOP VESSEL MINI RED (MISCELLANEOUS) ×1 IMPLANT
NS IRRIG 1000ML POUR BTL (IV SOLUTION) ×2 IMPLANT
PACK CV ACCESS (CUSTOM PROCEDURE TRAY) ×2 IMPLANT
PAD ARMBOARD 7.5X6 YLW CONV (MISCELLANEOUS) ×4 IMPLANT
SUT PROLENE 6 0 CC (SUTURE) ×4 IMPLANT
SUT SILK 2 0 SH (SUTURE) IMPLANT
SUT VIC AB 3-0 SH 27 (SUTURE) ×2
SUT VIC AB 3-0 SH 27X BRD (SUTURE) ×1 IMPLANT
SUT VICRYL 4-0 PS2 18IN ABS (SUTURE) ×2 IMPLANT
TOWEL GREEN STERILE (TOWEL DISPOSABLE) ×2 IMPLANT
UNDERPAD 30X36 HEAVY ABSORB (UNDERPADS AND DIAPERS) ×2 IMPLANT
WATER STERILE IRR 1000ML POUR (IV SOLUTION) ×2 IMPLANT

## 2019-11-18 NOTE — Transfer of Care (Signed)
Immediate Anesthesia Transfer of Care Note  Patient: [name]  Procedure(s) Performed: FIRST STAGE LEFT ARM [place] (Left Arm Upper)  Patient Location: PACU  Anesthesia Type:MAC  Level of Consciousness: awake, alert  and oriented  Airway & Oxygen Therapy: Patient Spontanous Breathing  Post-op Assessment: Report given to RN, Post -op Vital signs reviewed and stable and Patient moving all extremities X 4  Post vital signs: Reviewed and stable  Last Vitals:  Vitals Value Taken Time  BP 121/82 11/18/19 0910  Temp    Pulse 84 11/18/19 0911  Resp 16 11/18/19 0911  SpO2 97 % 11/18/19 0911  Vitals shown include unvalidated device data.  Last Pain:  Vitals:   11/18/19 0631  TempSrc:   PainSc: 0-No pain      Patients Stated Pain Goal: 4 (15/17/61 6073)  Complications: No complications documented.

## 2019-11-18 NOTE — Anesthesia Preprocedure Evaluation (Signed)
Anesthesia Evaluation  Patient identified by MRN, date of birth, ID band Patient awake    Reviewed: Allergy & Precautions, NPO status , Patient's Chart, lab work & pertinent test results  Airway Mallampati: I  TM Distance: >3 FB Neck ROM: Full    Dental   Pulmonary Patient abstained from smoking., former smoker,    Pulmonary exam normal        Cardiovascular hypertension, Pt. on medications Normal cardiovascular exam     Neuro/Psych    GI/Hepatic GERD  Medicated and Controlled,  Endo/Other  diabetes  Renal/GU Dialysis and ESRFRenal disease     Musculoskeletal   Abdominal   Peds  Hematology   Anesthesia Other Findings   Reproductive/Obstetrics                             Anesthesia Physical Anesthesia Plan  ASA: III  Anesthesia Plan: MAC   Post-op Pain Management:    Induction: Intravenous  PONV Risk Score and Plan: 1 and Ondansetron  Airway Management Planned: Nasal Cannula  Additional Equipment:   Intra-op Plan:   Post-operative Plan: Extubation in OR  Informed Consent: I have reviewed the patients History and Physical, chart, labs and discussed the procedure including the risks, benefits and alternatives for the proposed anesthesia with the patient or authorized representative who has indicated his/her understanding and acceptance.       Plan Discussed with: CRNA and Surgeon  Anesthesia Plan Comments:         Anesthesia Quick Evaluation

## 2019-11-18 NOTE — Anesthesia Procedure Notes (Signed)
Procedure Name: MAC Date/Time: 11/18/2019 7:36 AM Performed by: Inda Coke, CRNA Pre-anesthesia Checklist: Patient identified, Emergency Drugs available, Suction available, Timeout performed and Patient being monitored Patient Re-evaluated:Patient Re-evaluated prior to induction Oxygen Delivery Method: Simple face mask Induction Type: IV induction Ventilation: Oral airway inserted - appropriate to patient size Dental Injury: Teeth and Oropharynx as per pre-operative assessment

## 2019-11-18 NOTE — Op Note (Signed)
    Patient name: Louis Butler MRN: [id number] DOB: 1955-10-20 Sex: male  11/18/2019 Pre-operative Diagnosis: ESRD Post-operative diagnosis:  Same Surgeon:  Annamarie Major Assistants: Laurence Slate Procedure:   Left first stage basilic vein fistula creation Anesthesia: MAC Blood Loss: Minimal Specimens: None  Findings: Healthy appearing 4 mm brachial artery.  The basilic vein measured 4-5 mm.  Indications: The patient comes in today for dialysis access.  Preoperative vein mapping suggested adequate basilic vein.  Procedure:  The patient was identified in the holding area and taken to [place] 16  The patient was then placed supine on the table. MAC anesthesia was administered.  The patient was prepped and draped in the usual sterile fashion.  A time out was called and antibiotics were administered.  A PA was necessary to facilitate exposure and other technical details of the procedure.  1% lidocaine was used for local anesthesia.  Ultrasound was used to evaluate the cephalic vein which was not usable.  He did have an adequate basilic vein.  An oblique incision was made just proximal to the antecubital crease.  I first dissected out the brachial artery which was a healthy appearing 4 mm artery.  It was fully isolated within the incision.  I then identified the basilic vein.  It was fully mobilized throughout the width of the incision.  There were 3 branches were not exposed the vein.  Once the vein was fully mobilized it was marked for orientation.  I then ligated the 3 branches individually.  I did not feel that the branches would be sufficient and so I continued to mobilize the vein so that I could use the vein before it branched.  There was just barely enough length to do so.  I then occluded the brachial artery and opened it with a #11 blade which was extended longitudinally with Potts scissors.  I then performed a end-to-side anastomosis with running 6-0 Prolene.  Prior to completion the  appropriate flushing maneuvers were performed.  The anastomosis was completed.  The vein distended nicely and had an excellent thrill.  There was a palpable radial pulse.  The wound was then irrigated.  The incision was closed with 2 layers of Vicryl followed by Dermabond.   Disposition: To PACU stable.   [name], M.D., [facility] Vein Specialists of [place] Office: [phone] Pager:  [phone]

## 2019-11-18 NOTE — Interval H&P Note (Signed)
History and Physical Interval Note:  11/18/2019 7:27 AM  Louis Butler  has presented today for surgery, with the diagnosis of END STAGE RENAL DISEASE.  The various methods of treatment have been discussed with the patient and family. After consideration of risks, benefits and other options for treatment, the patient has consented to  Procedure(s): [place] (Left) as a surgical intervention.  The patient's history has been reviewed, patient examined, no change in status, stable for surgery.  I have reviewed the patient's chart and labs.  Questions were answered to the patient's satisfaction.     Annamarie Major

## 2019-11-18 NOTE — Discharge Instructions (Signed)
° °  [facility] ° °Discharge Instructions ° °AV Fistula or Graft Surgery for Dialysis Access ° °Please refer to the following instructions for your post-procedure care. Your surgeon or physician assistant will discuss any changes with you. ° °Activity ° °You may drive the day following your surgery, if you are comfortable and no longer taking prescription pain medication. Resume full activity as the soreness in your incision resolves. ° °Bathing/Showering ° °You may shower after you go home. Keep your incision dry for 48 hours. Do not soak in a bathtub, hot tub, or swim until the incision heals completely. You may not shower if you have a hemodialysis catheter. ° °Incision Care ° °Clean your incision with mild soap and water after 48 hours. Pat the area dry with a clean towel. You do not need a bandage unless otherwise instructed. Do not apply any ointments or creams to your incision. You may have skin glue on your incision. Do not peel it off. It will come off on its own in about one week. Your arm may swell a bit after surgery. To reduce swelling use pillows to elevate your arm so it is above your heart. Your doctor will tell you if you need to lightly wrap your arm with an ACE bandage. ° °Diet ° °Resume your normal diet. There are not special food restrictions following this procedure. In order to heal from your surgery, it is CRITICAL to get adequate nutrition. Your body requires vitamins, minerals, and protein. Vegetables are the best source of vitamins and minerals. Vegetables also provide the perfect balance of protein. Processed food has little nutritional value, so try to avoid this. ° °Medications ° °Resume taking all of your medications. If your incision is causing pain, you may take over-the counter pain relievers such as acetaminophen (Tylenol). If you were prescribed a stronger pain medication, please be aware these medications can cause nausea and constipation. Prevent  nausea by taking the medication with a snack or meal. Avoid constipation by drinking plenty of fluids and eating foods with high amount of fiber, such as fruits, vegetables, and grains. Do not take Tylenol if you are taking prescription pain medications. ° ° ° ° °Follow up °Your surgeon may want to see you in the office following your access surgery. If so, this will be arranged at the time of your surgery. ° °Please call us immediately for any of the following conditions: ° °Increased pain, redness, drainage (pus) from your incision site °Fever of 101 degrees or higher °Severe or worsening pain at your incision site °Hand pain or numbness. ° °Reduce your risk of vascular disease: ° °Stop smoking. If you would like help, call [facility] at 1-800-QUIT-NOW (1-800-784-8669) or [place] at 336-586-4000 ° °Manage your cholesterol °Maintain a desired weight °Control your diabetes °Keep your blood pressure down ° °Dialysis ° °It will take several weeks to several months for your new dialysis access to be ready for use. Your surgeon will determine when it is OK to use it. Your nephrologist will continue to direct your dialysis. You can continue to use your Permcath until your new access is ready for use. ° °If you have any questions, please call the office at [phone]. ° °

## 2019-11-21 ENCOUNTER — Encounter (HOSPITAL_COMMUNITY): Payer: Self-pay | Admitting: Surgery

## 2019-11-25 ENCOUNTER — Encounter (HOSPITAL_COMMUNITY): Payer: BC Managed Care – PPO

## 2019-11-25 ENCOUNTER — Other Ambulatory Visit (HOSPITAL_COMMUNITY): Payer: BC Managed Care – PPO

## 2019-11-25 ENCOUNTER — Encounter: Payer: BC Managed Care – PPO | Admitting: Vascular Surgery

## 2019-11-30 ENCOUNTER — Ambulatory Visit (INDEPENDENT_AMBULATORY_CARE_PROVIDER_SITE_OTHER): Payer: BC Managed Care – PPO

## 2019-11-30 ENCOUNTER — Other Ambulatory Visit: Payer: Self-pay | Admitting: Gastroenterology

## 2019-11-30 DIAGNOSIS — Z1159 Encounter for screening for other viral diseases: Secondary | ICD-10-CM

## 2019-11-30 NOTE — Anesthesia Postprocedure Evaluation (Signed)
Anesthesia Post Note  Patient: Louis Butler  Procedure(s) Performed: FIRST STAGE LEFT ARM BASCILIC VEIN TRANSPOSITION (Left Arm Upper)     Patient location during evaluation: PACU Anesthesia Type: MAC Level of consciousness: awake and alert Pain management: pain level controlled Vital Signs Assessment: post-procedure vital signs reviewed and stable Respiratory status: spontaneous breathing, nonlabored ventilation, respiratory function stable and patient connected to nasal cannula oxygen Cardiovascular status: stable and blood pressure returned to baseline Postop Assessment: no apparent nausea or vomiting Anesthetic complications: no   No complications documented.  Last Vitals:  Vitals:   11/18/19 0930 11/18/19 0944  BP: (!) 147/60   Pulse: 73 67  Resp: 18 18  Temp:  36.7 C  SpO2: 100% 96%    Last Pain:  Vitals:   11/18/19 0944  TempSrc:   PainSc: 3                  [name] DAVID

## 2019-12-01 ENCOUNTER — Other Ambulatory Visit: Payer: Self-pay

## 2019-12-01 LAB — SARS CORONAVIRUS 2 (TAT 6-24 HRS): SARS Coronavirus 2: NEGATIVE

## 2019-12-02 ENCOUNTER — Ambulatory Visit (AMBULATORY_SURGERY_CENTER): Payer: BC Managed Care – PPO | Admitting: Gastroenterology

## 2019-12-02 ENCOUNTER — Encounter: Payer: Self-pay | Admitting: Gastroenterology

## 2019-12-02 ENCOUNTER — Other Ambulatory Visit: Payer: Self-pay

## 2019-12-02 VITALS — BP 138/60 | HR 76 | Temp 98.2°F | Resp 14 | Ht 75.0 in | Wt 197.0 lb

## 2019-12-02 DIAGNOSIS — K31A0 Gastric intestinal metaplasia, unspecified: Secondary | ICD-10-CM

## 2019-12-02 DIAGNOSIS — K3189 Other diseases of stomach and duodenum: Secondary | ICD-10-CM | POA: Diagnosis present

## 2019-12-02 DIAGNOSIS — K2970 Gastritis, unspecified, without bleeding: Secondary | ICD-10-CM | POA: Diagnosis not present

## 2019-12-02 DIAGNOSIS — K2950 Unspecified chronic gastritis without bleeding: Secondary | ICD-10-CM

## 2019-12-02 MED ORDER — SODIUM CHLORIDE 0.9 % IV SOLN: 500.0000 mL | Freq: Once | INTRAVENOUS | Status: DC

## 2019-12-02 MED ORDER — ESOMEPRAZOLE MAGNESIUM 20 MG PO CPDR: 40.0000 mg | DELAYED_RELEASE_CAPSULE | Freq: Every day | ORAL | Status: DC

## 2019-12-02 NOTE — Patient Instructions (Signed)
Await pathology results.  Resume previous diet and medications.  Recommend increasing Nexium to 40 mg daily.  Take 30 minutes before breakfast or dinner for ideal effectiveness.  Repeat upper endoscopy in 3 years.  YOU HAD AN ENDOSCOPIC PROCEDURE TODAY AT [facility]:   Refer to the procedure report that was given to you for any specific questions about what was found during the examination.  If the procedure report does not answer your questions, please call your gastroenterologist to clarify.  If you requested that your care partner not be given the details of your procedure findings, then the procedure report has been included in a sealed envelope for you to review at your convenience later.  YOU SHOULD EXPECT: Some feelings of bloating in the abdomen. Passage of more gas than usual.  Walking can help get rid of the air that was put into your GI tract during the procedure and reduce the bloating. If you had a lower endoscopy (such as a colonoscopy or flexible sigmoidoscopy) you may notice spotting of blood in your stool or on the toilet paper. If you underwent a bowel prep for your procedure, you may not have a normal bowel movement for a few days.  Please Note:  You might notice some irritation and congestion in your nose or some drainage.  This is from the oxygen used during your procedure.  There is no need for concern and it should clear up in a day or so.  SYMPTOMS TO REPORT IMMEDIATELY:   Following upper endoscopy (EGD)  Vomiting of blood or coffee ground material  New chest pain or pain under the shoulder blades  Painful or persistently difficult swallowing  New shortness of breath  Fever of 100F or higher  Black, tarry-looking stools  For urgent or emergent issues, a gastroenterologist can be reached at any hour by calling [phone]. Do not use MyChart messaging for urgent concerns.    DIET:  We do recommend a small meal at first, but then you may  proceed to your regular diet.  Drink plenty of fluids but you should avoid alcoholic beverages for 24 hours.  ACTIVITY:  You should plan to take it easy for the rest of today and you should NOT DRIVE or use heavy machinery until tomorrow (because of the sedation medicines used during the test).    FOLLOW UP: Our staff will call the number listed on your records 48-72 hours following your procedure to check on you and address any questions or concerns that you may have regarding the information given to you following your procedure. If we do not reach you, we will leave a message.  We will attempt to reach you two times.  During this call, we will ask if you have developed any symptoms of COVID 19. If you develop any symptoms (ie: fever, flu-like symptoms, shortness of breath, cough etc.) before then, please call [phone].  If you test positive for Covid 19 in the 2 weeks post procedure, please call and report this information to [place].    If any biopsies were taken you will be contacted by phone or by letter within the next 1-3 weeks.  Please call us at [phone] if you have not heard about the biopsies in 3 weeks.    SIGNATURES/CONFIDENTIALITY: You and/or your care partner have signed paperwork which will be entered into your electronic medical record.  These signatures attest to the fact that that the information above on your After Visit Summary  has been reviewed and is understood.  Full responsibility of the confidentiality of this discharge information lies with you and/or your care-partner.

## 2019-12-02 NOTE — Progress Notes (Signed)
VS by CW  [name] states no medical or surgical changes since previsit or office visit.  

## 2019-12-02 NOTE — Op Note (Signed)
[facility] Patient Name: Louis Butler Procedure Date: 12/02/2019 11:48 AM MRN: [id number] Endoscopist: Justice Britain , MD Age: 64 Referring MD:  Date of Birth: 1955-06-28 Gender: Male Account #: [id number] Procedure:                Upper GI endoscopy Indications:              Intestinal metaplasia, Follow-up of intestinal                            metaplasia Medicines:                Monitored Anesthesia Care Procedure:                Pre-Anesthesia Assessment:                           - Prior to the procedure, a History and Physical                            was performed, and patient medications and                            allergies were reviewed. The patient's tolerance of                            previous anesthesia was also reviewed. The risks                            and benefits of the procedure and the sedation                            options and risks were discussed with the patient.                            All questions were answered, and informed consent                            was obtained. Prior Anticoagulants: The patient has                            taken no previous anticoagulant or antiplatelet                            agents. ASA Grade Assessment: III - A patient with                            severe systemic disease. After reviewing the risks                            and benefits, the patient was deemed in                            satisfactory condition to undergo the procedure.  After obtaining informed consent, the endoscope was                            passed under direct vision. Throughout the                            procedure, the patient's blood pressure, pulse, and                            oxygen saturations were monitored continuously. The                            Endoscope was introduced through the mouth, and                            advanced to the second part of duodenum. The  upper                            GI endoscopy was accomplished without difficulty.                            The patient tolerated the procedure. Scope In: Scope Out: Findings:                 No gross lesions were noted in the entire esophagus.                           The Z-line was irregular and was found 40 cm from                            the incisors.                           Localized moderately erythematous mucosa without                            bleeding was found in the gastric body and in the                            prepyloric region of the stomach.                           No other gross mucosal lesions were noted in the                            entire examined stomach. Decision made to pursue                            gastric mapping biopsies for history of intestinal                            metaplasia. Biopsies were taken with a cold forceps  for histology from the antrum. Biopsies were taken                            with a cold forceps for histology from the                            incisura. Biopsies were taken with a cold forceps                            for histology from the greater curve. Biopsies were                            taken with a cold forceps for histology from the                            lesser curve. Biopsies were taken with a cold                            forceps for histology from the fundus/cardia                            (placed in same jar).                           No gross lesions were noted in the duodenal bulb,                            in the first portion of the duodenum and in the                            second portion of the duodenum. Complications:            No immediate complications. Estimated Blood Loss:     Estimated blood loss was minimal. Impression:               - No gross lesions in esophagus. Z-line irregular,                            40 cm from the incisors.                            - Erythematous mucosa in the gastric body and                            prepyloric region of the stomach. No other gross                            lesions in the stomach. Gastric Mapping biopsies                            performed.                           - No gross lesions in the duodenal bulb,  in the                            first portion of the duodenum and in the second                            portion of the duodenum. Recommendation:           - The patient will be observed post-procedure,                            until all discharge criteria are met.                           - Discharge patient to home.                           - Patient has a contact number available for                            emergencies. The signs and symptoms of potential                            delayed complications were discussed with the                            patient. Return to normal activities tomorrow.                            Written discharge instructions were provided to the                            patient.                           - Resume previous diet.                           - Continue present medications.                           - Recommend increasing Nexium back to 40 mg daily.                           - Recommend Nexium be given 30 minutes before                            breakfast or before dinner for most ideal                            effectiveness.                           - Await pathology results.                           - Repeat upper endoscopy in likely  3 years for                            surveillance of GIM.                           - The findings and recommendations were discussed                            with the patient. Justice Britain, MD 12/02/2019 12:11:37 PM

## 2019-12-02 NOTE — Progress Notes (Signed)
PT taken to PACU. Monitors in place. VSS. Report given to RN. 

## 2019-12-02 NOTE — Progress Notes (Signed)
Called to room to assist during endoscopic procedure.  Patient ID and intended procedure confirmed with present staff. Received instructions for my participation in the procedure from the performing physician.  

## 2019-12-05 ENCOUNTER — Encounter: Payer: Self-pay | Admitting: Family Medicine

## 2019-12-05 LAB — HM DIABETES EYE EXAM

## 2019-12-06 ENCOUNTER — Telehealth: Payer: Self-pay

## 2019-12-06 ENCOUNTER — Other Ambulatory Visit: Payer: Self-pay

## 2019-12-06 DIAGNOSIS — Z992 Dependence on renal dialysis: Secondary | ICD-10-CM

## 2019-12-06 NOTE — Telephone Encounter (Signed)
°  Follow up Call-  Call back number 12/02/2019 06/28/2019  Post procedure Call Back phone  # [phone]  Permission to leave phone message Yes Yes  Some recent data might be hidden     Patient questions:  Do you have a fever, pain , or abdominal swelling? No. Pain Score  0 *  Have you tolerated food without any problems? Yes.    Have you been able to return to your normal activities? Yes.    Do you have any questions about your discharge instructions: Diet   No. Medications  No. Follow up visit  No.  Do you have questions or concerns about your Care? No.  Actions: * If pain score is 4 or above: No action needed, pain <4.  1. Have you developed a fever since your procedure? no  2.   Have you had an respiratory symptoms (SOB or cough) since your procedure? no  3.   Have you tested positive for COVID 19 since your procedure no  4.   Have you had any family members/close contacts diagnosed with the COVID 19 since your procedure?  no   If yes to any of these questions please route to Joylene John, RN and Joella Prince, RN

## 2019-12-15 ENCOUNTER — Encounter: Payer: Self-pay | Admitting: Gastroenterology

## 2019-12-16 NOTE — Progress Notes (Signed)
POST OPERATIVE OFFICE NOTE    CC:  F/u for surgery  HPI:  This is a 64 y.o. 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 Trula Slade.   Pt states he does not have pain/numbness in his left hand.  He has done well since surgery.    The pt is on dialysis T/T/S at [facility] location.   Allergies  Allergen Reactions  . Hydrocodone Hives and Itching  . Percocet [Oxycodone-Acetaminophen] Hives and Itching    Current Outpatient Medications  Medication Sig Dispense Refill  . acetaminophen (TYLENOL) 500 MG tablet Take 1,000 mg by mouth every 6 (six) hours as needed for headache (pain).     . B Complex-C-Folic Acid (RENA-VITE RX) 1 MG TABS Take 1 tablet by mouth See admin instructions. Take 1 tablet on Sun, Mon, Wed, and Fri. Dialysis gives this to him on Tues, Thurs, and Sat    . blood glucose meter kit and supplies KIT Dispense based on patient and insurance preference. Use up to four times daily as directed. (FOR ICD-9 250.00, 250.01). 1 each 0  . carvedilol (COREG) 12.5 MG tablet Take 12.5 mg by mouth 2 (two) times daily. (Patient not taking: Reported on 12/02/2019)    . famotidine (PEPCID) 20 MG tablet Take 1 tablet (20 mg total) by mouth daily. 30 tablet 0  . furosemide (LASIX) 80 MG tablet Take 80 mg by mouth 2 (two) times daily.    Marland Kitchen gabapentin (NEURONTIN) 300 MG capsule Take 1 capsule (300 mg total) by mouth at bedtime. 90 capsule 1  . hydrocortisone 2.5 % cream Apply topically 2 (two) times daily. (Patient taking differently: Apply 1 application topically 2 (two) times daily as needed (rash/skin irritation.). ) 30 g 0  . insulin detemir (LEVEMIR) 100 UNIT/ML injection Inject 0.05 mLs (5 Units total) into the skin daily. (Patient taking differently: Inject 6 Units into the skin daily. )    . Insulin Syringes, Disposable, U-100 0.5 ML MISC Use as per instructions 3 times daily AC. 100 each 1  . lipase/protease/amylase (CREON) 36000 UNITS CPEP capsule Take 1 capsule (36,000 Units  total) by mouth See admin instructions. Take 1 capsule with each snack and meal daily - up to two snacks daily. (Patient taking differently: Take 36,000 Units by mouth 3 (three) times daily with meals. ) 240 capsule 2  . loperamide (IMODIUM A-D) 2 MG tablet Take 2 mg by mouth 4 (four) times daily as needed for diarrhea or loose stools.    . naproxen sodium (ALEVE) 220 MG tablet Take 220 mg by mouth 2 (two) times daily as needed (pain).    Jonna Coup Oil (SWEET OIL) OIL Place 1 Dose into both ears daily as needed (ear wax).    . SULFAMETHOXAZOLE-TRIMETHOPRIM PO Take by mouth. 400-72m after dialysis    . tamsulosin (FLOMAX) 0.4 MG CAPS capsule Take 0.8 mg by mouth daily.    . traMADol (ULTRAM) 50 MG tablet Take 1 tablet (50 mg total) by mouth every 6 (six) hours as needed. 8 tablet 0   Current Facility-Administered Medications  Medication Dose Route Frequency Provider Last Rate Last Admin  . esomeprazole (NEXIUM) capsule 40 mg  40 mg Oral Q1200 Mansouraty, GTelford Nab, MD         ROS:  See HPI  Physical Exam:  Today's Vitals   12/19/19 1325  BP: (!) 159/81  Pulse: 76  Temp: 98.6 F (37 C)  TempSrc: Temporal  SpO2: 100%  Weight:  204 lb 6.4 oz (92.7 kg)  Height: 6' 3"  (1.905 m)   Body mass index is 25.55 kg/m.   Incision:  Well healed Extremities:   There is a palpable left and right radial pulse.   Motor and sensory are in tact.  Strong left hand grip. There is a thrill/bruit present.    Dialysis Duplex on 12/19/2019: Diameter:  0.73cm-1.13cm Depth:  0.74cm-1.22cm   Assessment/Plan:  This is a 64 y.o. 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 Trula Slade.    -the pt does not have evidence of steal. -the fistula has matured nicely and he will be scheduled for a left 2nd stage BVT by Dr. Trula Slade on a non dialysis day.   -Discussed steal sx with pt and if he developed any of those, we would need to see him back sooner.   -discussed 2nd stage BVT with pt and also  discussed that these do not last forever and will eventually need some maintenance or even new access.      Leontine Locket, [facility] [phone]  Clinic MD:  Trula Slade

## 2019-12-16 NOTE — H&P (View-Only) (Signed)
POST OPERATIVE OFFICE NOTE    CC:  F/u for surgery  HPI:  This is a 64 y.o. 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 Trula Slade.   Pt states he does not have pain/numbness in his left hand.  He has done well since surgery.    The pt is on dialysis T/T/S at [facility] location.   Allergies  Allergen Reactions  . Hydrocodone Hives and Itching  . Percocet [Oxycodone-Acetaminophen] Hives and Itching    Current Outpatient Medications  Medication Sig Dispense Refill  . acetaminophen (TYLENOL) 500 MG tablet Take 1,000 mg by mouth every 6 (six) hours as needed for headache (pain).     . B Complex-C-Folic Acid (RENA-VITE RX) 1 MG TABS Take 1 tablet by mouth See admin instructions. Take 1 tablet on Sun, Mon, Wed, and Fri. Dialysis gives this to him on Tues, Thurs, and Sat    . blood glucose meter kit and supplies KIT Dispense based on patient and insurance preference. Use up to four times daily as directed. (FOR ICD-9 250.00, 250.01). 1 each 0  . carvedilol (COREG) 12.5 MG tablet Take 12.5 mg by mouth 2 (two) times daily. (Patient not taking: Reported on 12/02/2019)    . famotidine (PEPCID) 20 MG tablet Take 1 tablet (20 mg total) by mouth daily. 30 tablet 0  . furosemide (LASIX) 80 MG tablet Take 80 mg by mouth 2 (two) times daily.    Marland Kitchen gabapentin (NEURONTIN) 300 MG capsule Take 1 capsule (300 mg total) by mouth at bedtime. 90 capsule 1  . hydrocortisone 2.5 % cream Apply topically 2 (two) times daily. (Patient taking differently: Apply 1 application topically 2 (two) times daily as needed (rash/skin irritation.). ) 30 g 0  . insulin detemir (LEVEMIR) 100 UNIT/ML injection Inject 0.05 mLs (5 Units total) into the skin daily. (Patient taking differently: Inject 6 Units into the skin daily. )    . Insulin Syringes, Disposable, U-100 0.5 ML MISC Use as per instructions 3 times daily AC. 100 each 1  . lipase/protease/amylase (CREON) 36000 UNITS CPEP capsule Take 1 capsule (36,000 Units  total) by mouth See admin instructions. Take 1 capsule with each snack and meal daily - up to two snacks daily. (Patient taking differently: Take 36,000 Units by mouth 3 (three) times daily with meals. ) 240 capsule 2  . loperamide (IMODIUM A-D) 2 MG tablet Take 2 mg by mouth 4 (four) times daily as needed for diarrhea or loose stools.    . naproxen sodium (ALEVE) 220 MG tablet Take 220 mg by mouth 2 (two) times daily as needed (pain).    Jonna Coup Oil (SWEET OIL) OIL Place 1 Dose into both ears daily as needed (ear wax).    . SULFAMETHOXAZOLE-TRIMETHOPRIM PO Take by mouth. 400-45m after dialysis    . tamsulosin (FLOMAX) 0.4 MG CAPS capsule Take 0.8 mg by mouth daily.    . traMADol (ULTRAM) 50 MG tablet Take 1 tablet (50 mg total) by mouth every 6 (six) hours as needed. 8 tablet 0   Current Facility-Administered Medications  Medication Dose Route Frequency Provider Last Rate Last Admin  . esomeprazole (NEXIUM) capsule 40 mg  40 mg Oral Q1200 Mansouraty, GTelford Nab, MD         ROS:  See HPI  Physical Exam:  Today's Vitals   12/19/19 1325  BP: (!) 159/81  Pulse: 76  Temp: 98.6 F (37 C)  TempSrc: Temporal  SpO2: 100%  Weight:  204 lb 6.4 oz (92.7 kg)  Height: 6' 3"  (1.905 m)   Body mass index is 25.55 kg/m.   Incision:  Well healed Extremities:   There is a palpable left and right radial pulse.   Motor and sensory are in tact.  Strong left hand grip. There is a thrill/bruit present.    Dialysis Duplex on 12/19/2019: Diameter:  0.73cm-1.13cm Depth:  0.74cm-1.22cm   Assessment/Plan:  This is a 64 y.o. 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 Trula Slade.    -the pt does not have evidence of steal. -the fistula has matured nicely and he will be scheduled for a left 2nd stage BVT by Dr. Trula Slade on a non dialysis day.   -Discussed steal sx with pt and if he developed any of those, we would need to see him back sooner.   -discussed 2nd stage BVT with pt and also  discussed that these do not last forever and will eventually need some maintenance or even new access.      Leontine Locket, [facility] [phone]  Clinic MD:  Trula Slade

## 2019-12-19 ENCOUNTER — Ambulatory Visit (INDEPENDENT_AMBULATORY_CARE_PROVIDER_SITE_OTHER): Payer: Self-pay | Admitting: Physician Assistant

## 2019-12-19 ENCOUNTER — Other Ambulatory Visit: Payer: Self-pay

## 2019-12-19 ENCOUNTER — Ambulatory Visit (HOSPITAL_COMMUNITY)
Admission: RE | Admit: 2019-12-19 | Discharge: 2019-12-19 | Disposition: A | Discharge status: Home / Self Care | Payer: BC Managed Care – PPO | Source: Ambulatory Visit | Attending: Surgery | Admitting: Surgery

## 2019-12-19 VITALS — BP 159/81 | HR 76 | Temp 98.6°F | Ht 75.0 in | Wt 204.4 lb

## 2019-12-19 DIAGNOSIS — Z992 Dependence on renal dialysis: Secondary | ICD-10-CM | POA: Insufficient documentation

## 2019-12-19 DIAGNOSIS — N186 End stage renal disease: Secondary | ICD-10-CM

## 2019-12-23 ENCOUNTER — Encounter (HOSPITAL_COMMUNITY): Payer: BC Managed Care – PPO

## 2019-12-28 ENCOUNTER — Encounter: Payer: Self-pay | Admitting: Podiatry

## 2019-12-28 ENCOUNTER — Ambulatory Visit: Payer: BC Managed Care – PPO | Admitting: Podiatry

## 2019-12-28 ENCOUNTER — Other Ambulatory Visit: Payer: Self-pay

## 2019-12-28 DIAGNOSIS — N179 Acute kidney failure, unspecified: Secondary | ICD-10-CM

## 2019-12-28 DIAGNOSIS — M79674 Pain in right toe(s): Secondary | ICD-10-CM

## 2019-12-28 DIAGNOSIS — B351 Tinea unguium: Secondary | ICD-10-CM | POA: Diagnosis not present

## 2019-12-28 DIAGNOSIS — M79675 Pain in left toe(s): Secondary | ICD-10-CM | POA: Diagnosis not present

## 2019-12-28 DIAGNOSIS — E1142 Type 2 diabetes mellitus with diabetic polyneuropathy: Secondary | ICD-10-CM

## 2019-12-28 NOTE — Progress Notes (Signed)
This patient presents to the office with chief complaint of long thick nails and diabetic feet.  This patient  says there  is  no pain and discomfort in his  feet.  This patient says there are long thick painful big  nails.  These nails are painful walking and wearing shoes.  Patient has no history of infection or drainage from both feet.  Patient is unable to  self treat his own nails . This patient presents  to the office today for treatment of the  long nails and a foot evaluation due to history of  diabetes.  General Appearance  Alert, conversant and in no acute stress.  Vascular  Dorsalis pedis and posterior tibial  pulses are palpable  bilaterally.  Capillary return is within normal limits  bilaterally. Temperature is within normal limits  bilaterally.  Neurologic  Senn-Weinstein monofilament wire test diminished/absent  bilaterally. Muscle power within normal limits bilaterally.  Nails Thick disfigured discolored nails with subungual debris  Hallux toenails  bilaterally. No evidence of bacterial infection or drainage bilaterally.  Orthopedic  No limitations of motion of motion feet .  No crepitus or effusions noted.  No bony pathology or digital deformities noted.  Skin  normotropic skin with no porokeratosis noted bilaterally.  No signs of infections or ulcers noted.     Onychomycosis  Diabetes with no foot complications  IE  Debride nails x 10.  A diabetic foot exam was performed and there is no evidence of any vascular .  Patient has neuropathy with LOPS diminished to absent  B/L. RTC 3 months.   Gardiner Barefoot DPM

## 2019-12-30 ENCOUNTER — Telehealth: Payer: Self-pay | Admitting: Family Medicine

## 2019-12-30 ENCOUNTER — Encounter (HOSPITAL_COMMUNITY): Payer: Self-pay | Admitting: Surgery

## 2019-12-30 ENCOUNTER — Other Ambulatory Visit: Payer: Self-pay

## 2019-12-30 NOTE — Telephone Encounter (Signed)
Just wanting to document in chart. Patient's wife called and wanted to schedule a follow for her and husband, but nothing would work due to patient having dialysis's on Tuesday's and Thursday's from 4:30am-10:30/11. I checked October and November and there was nothing for the day's they were available Monday, Wednesday, Friday. Just wanted to note in the chart  just to let doctor know and patient's wife said she would rather stick with PCP. Just wanted to [place] doctor wanted to reach out to patient's wife. Thanks

## 2019-12-30 NOTE — Progress Notes (Signed)
I called and spoke to [place] Bailiff, Thayer Headings wife; Mr Sabra Heck asked his wife to take the call and we have written permission to speak to [place].  Mrs. Tench reports that Mr. Fury has not had any chest pain or shortness of breath. Mrs. Sabra Heck stated that they do not have s/s of Covid and have not to  her knowledge been around anyone with S?S.  Mr. Duffner wil be tested for Covid on 9/27/2.  Mr. Sabra Heck has type II diabetes.  Mrs Ottaviano reports that CBGs have been normal, patient was not in the home to give any numbers. I instructed Mrs Sabra Heck to have Mr. Sabra Heck take 3 units of Levemir Insulin on Wednesday morning, if CBG is greater than 70. I instructed Mrs. Miller to have Mr. Sabra Heck  to check CBG after awaking and every 2 hours until arrival  to the hospital. If CBG is less than 70 to drink  1/2 cup of a clear juice. Recheck CBG in 15 minutes then call pre- op desk at [phone] for further instructions. If scheduled to receive Insulin, do not take Insulin

## 2020-01-02 ENCOUNTER — Other Ambulatory Visit (HOSPITAL_COMMUNITY)
Admission: RE | Admit: 2020-01-02 | Discharge: 2020-01-02 | Disposition: A | Discharge status: Home / Self Care | Payer: BC Managed Care – PPO | Source: Ambulatory Visit | Attending: Surgery | Admitting: Surgery

## 2020-01-02 DIAGNOSIS — Z20822 Contact with and (suspected) exposure to covid-19: Secondary | ICD-10-CM | POA: Insufficient documentation

## 2020-01-02 DIAGNOSIS — Z01812 Encounter for preprocedural laboratory examination: Secondary | ICD-10-CM | POA: Insufficient documentation

## 2020-01-02 LAB — SARS CORONAVIRUS 2 (TAT 6-24 HRS): SARS Coronavirus 2: NEGATIVE

## 2020-01-04 ENCOUNTER — Other Ambulatory Visit: Payer: Self-pay

## 2020-01-04 ENCOUNTER — Ambulatory Visit (HOSPITAL_COMMUNITY): Payer: BC Managed Care – PPO | Admitting: Anesthesiology

## 2020-01-04 ENCOUNTER — Ambulatory Visit (HOSPITAL_COMMUNITY)
Admission: RE | Admit: 2020-01-04 | Discharge: 2020-01-04 | Disposition: A | Discharge status: Home / Self Care | Payer: BC Managed Care – PPO | Attending: Surgery | Admitting: Surgery

## 2020-01-04 ENCOUNTER — Encounter (HOSPITAL_COMMUNITY): Payer: Self-pay | Admitting: Surgery

## 2020-01-04 ENCOUNTER — Encounter (HOSPITAL_COMMUNITY)
Admission: RE | Disposition: A | Discharge status: Home / Self Care | Payer: Self-pay | Source: Home / Self Care | Attending: Surgery

## 2020-01-04 DIAGNOSIS — E1122 Type 2 diabetes mellitus with diabetic chronic kidney disease: Secondary | ICD-10-CM | POA: Insufficient documentation

## 2020-01-04 DIAGNOSIS — N186 End stage renal disease: Secondary | ICD-10-CM | POA: Diagnosis not present

## 2020-01-04 DIAGNOSIS — Z79899 Other long term (current) drug therapy: Secondary | ICD-10-CM | POA: Insufficient documentation

## 2020-01-04 DIAGNOSIS — Z794 Long term (current) use of insulin: Secondary | ICD-10-CM | POA: Insufficient documentation

## 2020-01-04 DIAGNOSIS — Z87891 Personal history of nicotine dependence: Secondary | ICD-10-CM | POA: Insufficient documentation

## 2020-01-04 DIAGNOSIS — N185 Chronic kidney disease, stage 5: Secondary | ICD-10-CM

## 2020-01-04 DIAGNOSIS — I120 Hypertensive chronic kidney disease with stage 5 chronic kidney disease or end stage renal disease: Secondary | ICD-10-CM | POA: Diagnosis present

## 2020-01-04 DIAGNOSIS — K219 Gastro-esophageal reflux disease without esophagitis: Secondary | ICD-10-CM | POA: Insufficient documentation

## 2020-01-04 DIAGNOSIS — Z992 Dependence on renal dialysis: Secondary | ICD-10-CM | POA: Insufficient documentation

## 2020-01-04 HISTORY — PX: BASCILIC VEIN TRANSPOSITION: SHX5742

## 2020-01-04 LAB — POCT I-STAT, CHEM 8
BUN: 29 mg/dL — ABNORMAL HIGH (ref 8–23)
Calcium, Ion: 1.02 mmol/L — ABNORMAL LOW (ref 1.15–1.40)
Chloride: 94 mmol/L — ABNORMAL LOW (ref 98–111)
Creatinine, Ser: 3.6 mg/dL — ABNORMAL HIGH (ref 0.61–1.24)
Glucose, Bld: 143 mg/dL — ABNORMAL HIGH (ref 70–99)
HCT: 34 % — ABNORMAL LOW (ref 39.0–52.0)
Hemoglobin: 11.6 g/dL — ABNORMAL LOW (ref 13.0–17.0)
Potassium: 3.6 mmol/L (ref 3.5–5.1)
Sodium: 135 mmol/L (ref 135–145)
TCO2: 28 mmol/L (ref 22–32)

## 2020-01-04 LAB — GLUCOSE, CAPILLARY
Glucose-Capillary: 133 mg/dL — ABNORMAL HIGH (ref 70–99)
Glucose-Capillary: 126 mg/dL — ABNORMAL HIGH (ref 70–99)
Glucose-Capillary: 134 mg/dL — ABNORMAL HIGH (ref 70–99)

## 2020-01-04 SURGERY — TRANSPOSITION, VEIN, BASILIC
Anesthesia: Monitor Anesthesia Care | Site: Arm Upper | Laterality: Left

## 2020-01-04 MED ORDER — SODIUM CHLORIDE 0.9 % IV SOLN: INTRAVENOUS | Status: DC

## 2020-01-04 MED ORDER — CEFAZOLIN SODIUM-DEXTROSE 2-4 GM/100ML-% IV SOLN
2.0000 g | INTRAVENOUS | Status: AC
  Administered 2020-01-04: 2 g via INTRAVENOUS
  Filled 2020-01-04: qty 100

## 2020-01-04 MED ORDER — CHLORHEXIDINE GLUCONATE 0.12 % MT SOLN
15.0000 mL | Freq: Once | OROMUCOSAL | Status: AC
  Administered 2020-01-04: 15 mL via OROMUCOSAL
  Filled 2020-01-04: qty 15

## 2020-01-04 MED ORDER — ONDANSETRON HCL 4 MG/2ML IJ SOLN: 4.0000 mg | Freq: Once | INTRAMUSCULAR | Status: DC | PRN

## 2020-01-04 MED ORDER — MIDAZOLAM HCL 2 MG/2ML IJ SOLN
INTRAMUSCULAR | Status: AC
  Filled 2020-01-04: qty 2

## 2020-01-04 MED ORDER — LACTATED RINGERS IV SOLN: INTRAVENOUS | Status: DC

## 2020-01-04 MED ORDER — FENTANYL CITRATE (PF) 100 MCG/2ML IJ SOLN
INTRAMUSCULAR | Status: DC | PRN
Start: 2020-01-04 — End: 2020-01-04
  Administered 2020-01-04: 50 ug via INTRAVENOUS
  Administered 2020-01-04 (×2): 25 ug via INTRAVENOUS
  Administered 2020-01-04: 50 ug via INTRAVENOUS

## 2020-01-04 MED ORDER — PROPOFOL 10 MG/ML IV BOLUS
INTRAVENOUS | Status: AC
  Filled 2020-01-04: qty 40

## 2020-01-04 MED ORDER — MIDAZOLAM HCL 5 MG/5ML IJ SOLN
INTRAMUSCULAR | Status: DC | PRN
  Administered 2020-01-04 (×2): 1 mg via INTRAVENOUS

## 2020-01-04 MED ORDER — PROPOFOL 500 MG/50ML IV EMUL
INTRAVENOUS | Status: DC | PRN
  Administered 2020-01-04: 100 ug/kg/min via INTRAVENOUS

## 2020-01-04 MED ORDER — LIDOCAINE-EPINEPHRINE (PF) 1 %-1:200000 IJ SOLN
INTRAMUSCULAR | Status: DC | PRN
  Administered 2020-01-04: 9 mL

## 2020-01-04 MED ORDER — FENTANYL CITRATE (PF) 100 MCG/2ML IJ SOLN
25.0000 ug | INTRAMUSCULAR | Status: DC | PRN
  Administered 2020-01-04: 25 ug via INTRAVENOUS

## 2020-01-04 MED ORDER — TRAMADOL HCL 50 MG PO TABS: 50.0000 mg | ORAL_TABLET | Freq: Four times a day (QID) | ORAL | 0 refills | Status: DC | PRN

## 2020-01-04 MED ORDER — ORAL CARE MOUTH RINSE: 15.0000 mL | Freq: Once | OROMUCOSAL | Status: AC

## 2020-01-04 MED ORDER — CHLORHEXIDINE GLUCONATE 4 % EX LIQD: 60.0000 mL | Freq: Once | CUTANEOUS | Status: DC

## 2020-01-04 MED ORDER — FENTANYL CITRATE (PF) 250 MCG/5ML IJ SOLN
INTRAMUSCULAR | Status: AC
  Filled 2020-01-04: qty 5

## 2020-01-04 MED ORDER — CARVEDILOL 12.5 MG PO TABS
ORAL_TABLET | ORAL | Status: AC
  Administered 2020-01-04: 12.5 mg
  Filled 2020-01-04: qty 1

## 2020-01-04 MED ORDER — BUPIVACAINE HCL (PF) 0.5 % IJ SOLN
INTRAMUSCULAR | Status: AC
  Filled 2020-01-04: qty 30

## 2020-01-04 MED ORDER — BUPIVACAINE LIPOSOME 1.3 % IJ SUSP
20.0000 mL | Freq: Once | INTRAMUSCULAR | Status: DC
  Filled 2020-01-04: qty 20

## 2020-01-04 MED ORDER — BUPIVACAINE LIPOSOME 1.3 % IJ SUSP
INTRAMUSCULAR | Status: DC | PRN
  Administered 2020-01-04: 80 mL

## 2020-01-04 MED ORDER — SODIUM CHLORIDE 0.9 % IV SOLN
INTRAVENOUS | Status: DC | PRN
  Administered 2020-01-04: 11:00:00 500 mL

## 2020-01-04 MED ORDER — FENTANYL CITRATE (PF) 100 MCG/2ML IJ SOLN
INTRAMUSCULAR | Status: AC
  Filled 2020-01-04: qty 2

## 2020-01-04 SURGICAL SUPPLY — 34 items
ADH SKN CLS APL DERMABOND .7 (GAUZE/BANDAGES/DRESSINGS) ×1
ARMBAND PINK RESTRICT EXTREMIT (MISCELLANEOUS) ×3 IMPLANT
BNDG ELASTIC 4X5.8 VLCR STR LF (GAUZE/BANDAGES/DRESSINGS) ×2 IMPLANT
CANISTER SUCT 3000ML PPV (MISCELLANEOUS) ×3 IMPLANT
CLIP VESOCCLUDE MED 24/CT (CLIP) IMPLANT
CLIP VESOCCLUDE MED 6/CT (CLIP) ×2 IMPLANT
CLIP VESOCCLUDE SM WIDE 24/CT (CLIP) IMPLANT
CLIP VESOCCLUDE SM WIDE 6/CT (CLIP) ×2 IMPLANT
COVER PROBE W GEL 5X96 (DRAPES) ×3 IMPLANT
DERMABOND ADVANCED (GAUZE/BANDAGES/DRESSINGS) ×2
DERMABOND ADVANCED .7 DNX12 (GAUZE/BANDAGES/DRESSINGS) ×1 IMPLANT
ELECT REM PT RETURN 9FT ADLT (ELECTROSURGICAL) ×3
ELECTRODE REM PT RTRN 9FT ADLT (ELECTROSURGICAL) ×1 IMPLANT
GLOVE BIOGEL PI IND STRL 7.5 (GLOVE) ×1 IMPLANT
GLOVE BIOGEL PI INDICATOR 7.5 (GLOVE) ×2
GLOVE SURG SS PI 7.5 STRL IVOR (GLOVE) ×3 IMPLANT
GOWN STRL REUS W/ TWL LRG LVL3 (GOWN DISPOSABLE) ×2 IMPLANT
GOWN STRL REUS W/ TWL XL LVL3 (GOWN DISPOSABLE) ×1 IMPLANT
GOWN STRL REUS W/TWL LRG LVL3 (GOWN DISPOSABLE) ×6
GOWN STRL REUS W/TWL XL LVL3 (GOWN DISPOSABLE) ×3
HEMOSTAT SNOW SURGICEL 2X4 (HEMOSTASIS) IMPLANT
KIT BASIN OR (CUSTOM PROCEDURE TRAY) ×3 IMPLANT
KIT TURNOVER KIT B (KITS) ×3 IMPLANT
NS IRRIG 1000ML POUR BTL (IV SOLUTION) ×3 IMPLANT
PACK CV ACCESS (CUSTOM PROCEDURE TRAY) ×3 IMPLANT
PAD ARMBOARD 7.5X6 YLW CONV (MISCELLANEOUS) ×6 IMPLANT
SUT PROLENE 6 0 CC (SUTURE) ×3 IMPLANT
SUT SILK 2 0 SH (SUTURE) ×2 IMPLANT
SUT VIC AB 3-0 SH 27 (SUTURE) ×3
SUT VIC AB 3-0 SH 27X BRD (SUTURE) ×1 IMPLANT
SUT VICRYL 4-0 PS2 18IN ABS (SUTURE) ×3 IMPLANT
TOWEL GREEN STERILE (TOWEL DISPOSABLE) ×3 IMPLANT
UNDERPAD 30X36 HEAVY ABSORB (UNDERPADS AND DIAPERS) ×3 IMPLANT
WATER STERILE IRR 1000ML POUR (IV SOLUTION) ×3 IMPLANT

## 2020-01-04 NOTE — Op Note (Signed)
    Patient name: MEADE HOGELAND MRN: [id number] DOB: 04/27/1955 Sex: male  01/04/2020 Pre-operative Diagnosis: ESRD Post-operative diagnosis:  Same Surgeon:  Annamarie Major Assistants:  Ivin Booty Procedure:   Left second stage basilic vein fistula creation Anesthesia: MAC Blood Loss: Minimal Specimens: None  Findings: End to end anastomosis was performed near the antecubital crease.  Exparel was used within the incision  Indications: Patient is previously undergone for staged left basilic vein fistula creation.  The vein has matured nicely.  He is here today for second stage.  Procedure:  The patient was identified in the holding area and taken to [facility] [address]  The patient was then placed supine on the table. MAC anesthesia was administered.  The patient was prepped and draped in the usual sterile fashion.  A time out was called and antibiotics were administered.  A PA was necessary to expedite the procedure as well as assist with technical portions.  Exparel was used for local anesthesia.  Ultrasound was used to evaluate the fistula which was of excellent caliber.  2 longitudinal incisions were made in the upper arm over top of the fistula.  The vein was dissected free circumferentially mobilized.  Nerves were protected.  Side branches were ligated between silk ties.  Once the vein was fully mobilized from the axilla down to the antecubital crease it was marked for orientation.  A curved Gore tunneler was used to create a tunnel.  Baby Gregory clamps were placed in the axilla and antecubital crease on the vein and the vein was then transected near the antecubital crease.  It was brought through the previously created tunnel making sure to maintain proper orientation.  Next a end to end anastomosis was performed with running 5-0 Prolene.  Prior to completion the appropriate flushing members were performed the anastomosis was completed.  The clamps were then released.  I made sure that there was no  kink or twisting within the vein.  There was an excellent thrill within the fistula.  Wound was copiously irrigated.  Hemostasis was achieved.  The deep tissue was reapproximated 3-0 Vicryl.  The skin was closed with 4-0 Vicryl.  Dermabond was applied followed by an Ace bandage.  There were no immediate complications   Disposition:.  To PACU stable.   [name], M.D., [facility] [place] Office: [phone] Pager:  [phone]

## 2020-01-04 NOTE — Anesthesia Preprocedure Evaluation (Addendum)
Anesthesia Evaluation  Patient identified by MRN, date of birth, ID band Patient awake    Reviewed: Patient's Chart, lab work & pertinent test results  Airway Mallampati: II  TM Distance: >3 FB Neck ROM: Full    Dental  (+) Teeth Intact   Pulmonary Patient abstained from smoking., former smoker,    Pulmonary exam normal        Cardiovascular hypertension, Pt. on medications  Rhythm:Regular Rate:Normal     Neuro/Psych negative neurological ROS  negative psych ROS   GI/Hepatic Neg liver ROS, GERD  Medicated,  Endo/Other  negative endocrine ROSdiabetes  Renal/GU ESRF and DialysisRenal disease     Musculoskeletal  (+) Arthritis ,   Abdominal (+)  Abdomen: soft. Bowel sounds: normal.  Peds  Hematology negative hematology ROS (+)   Anesthesia Other Findings   Reproductive/Obstetrics                            Anesthesia Physical Anesthesia Plan  ASA: III  Anesthesia Plan: MAC   Post-op Pain Management:    Induction:   PONV Risk Score and Plan: Ondansetron and Treatment may vary due to age or medical condition  Airway Management Planned: Simple Face Mask and Nasal Cannula  Additional Equipment: None  Intra-op Plan:   Post-operative Plan:   Informed Consent: I have reviewed the patients History and Physical, chart, labs and discussed the procedure including the risks, benefits and alternatives for the proposed anesthesia with the patient or authorized representative who has indicated his/her understanding and acceptance.     Dental advisory given  Plan Discussed with: CRNA and Surgeon  Anesthesia Plan Comments: (Lab Results      Component                Value               Date                      WBC                      8.3                 08/04/2019                HGB                      11.6 (L)            01/04/2020                HCT                      34.0 (L)             01/04/2020                MCV                      93.4                08/04/2019                PLT                      242  08/04/2019           Lab Results      Component                Value               Date                      NA                       135                 01/04/2020                K                        3.6                 01/04/2020                CO2                      25                  08/09/2019                GLUCOSE                  143 (H)             01/04/2020                BUN                      29 (H)              01/04/2020                CREATININE               3.60 (H)            01/04/2020                CALCIUM                  7.8 (L)             08/09/2019                GFRNONAA                 13 (L)              08/09/2019                GFRAA                    15 (L)              08/09/2019          )        Anesthesia Quick Evaluation

## 2020-01-04 NOTE — Transfer of Care (Signed)
Immediate Anesthesia Transfer of Care Note  Patient: Louis Butler  Procedure(s) Performed: LEFT SECOND STAGE BASCILIC VEIN TRANSPOSITION (Left Arm Upper)  Patient Location: PACU  Anesthesia Type:MAC  Level of Consciousness: awake and alert   Airway & Oxygen Therapy: Patient Spontanous Breathing and Patient connected to face mask oxygen  Post-op Assessment: Report given to RN and Post -op Vital signs reviewed and stable  Post vital signs: Reviewed and stable  Last Vitals:  Vitals Value Taken Time  BP 107/72 01/04/20 1123  Temp    Pulse 127 01/04/20 1125  Resp 17 01/04/20 1125  SpO2 95 % 01/04/20 1125  Vitals shown include unvalidated device data.  Last Pain:  Vitals:   01/04/20 0804  TempSrc:   PainSc: 0-No pain      Patients Stated Pain Goal: 3 (52/84/13 2440)  Complications: No complications documented.

## 2020-01-04 NOTE — Anesthesia Postprocedure Evaluation (Signed)
Anesthesia Post Note  Patient: Louis Butler  Procedure(s) Performed: LEFT SECOND STAGE BASCILIC VEIN TRANSPOSITION (Left Arm Upper)     Patient location during evaluation: PACU Anesthesia Type: MAC Level of consciousness: awake and alert Pain management: pain level controlled Vital Signs Assessment: post-procedure vital signs reviewed and stable Respiratory status: spontaneous breathing, nonlabored ventilation, respiratory function stable and patient connected to nasal cannula oxygen Cardiovascular status: stable and blood pressure returned to baseline Postop Assessment: no apparent nausea or vomiting Anesthetic complications: no   No complications documented.  Last Vitals:  Vitals:   01/04/20 1140 01/04/20 1150  BP: 131/68 119/64  Pulse: 62 60  Resp: 15 14  Temp:  36.8 C  SpO2: 98% 95%    Last Pain:  Vitals:   01/04/20 1140  TempSrc:   PainSc: 8                  Janiesha Diehl P Arianna Delsanto

## 2020-01-04 NOTE — Interval H&P Note (Signed)
History and Physical Interval Note:  01/04/2020 7:23 AM  Louis Butler  has presented today for surgery, with the diagnosis of END STAGE RENAL DISEASE.  The various methods of treatment have been discussed with the patient and family. After consideration of risks, benefits and other options for treatment, the patient has consented to  Procedure(s): LEFT SECOND STAGE [place] (Left) as a surgical intervention.  The patient's history has been reviewed, patient examined, no change in status, stable for surgery.  I have reviewed the patient's chart and labs.  Questions were answered to the patient's satisfaction.     Annamarie Major

## 2020-01-04 NOTE — Anesthesia Procedure Notes (Signed)
Procedure Name: MAC Date/Time: 01/04/2020 10:00 AM Performed by: Lieutenant Diego, CRNA Pre-anesthesia Checklist: Patient identified, Emergency Drugs available, Suction available, Patient being monitored and Timeout performed Patient Re-evaluated:Patient Re-evaluated prior to induction Oxygen Delivery Method: Simple face mask Induction Type: IV induction

## 2020-01-04 NOTE — Discharge Instructions (Signed)
[facility]  Discharge Instructions  AV Fistula or Graft Surgery for Dialysis Access  Please refer to the following instructions for your post-procedure care. Your surgeon or physician assistant will discuss any changes with you.  Activity  You may drive the day following your surgery, if you are comfortable and no longer taking prescription pain medication. Resume full activity as the soreness in your incision resolves.  Bathing/Showering  You may shower after you go home. Keep your incision dry for 48 hours. Do not soak in a bathtub, hot tub, or swim until the incision heals completely. You may not shower if you have a hemodialysis catheter.  Incision Care  Clean your incision with mild soap and water after 48 hours. Pat the area dry with a clean towel. You do not need a bandage unless otherwise instructed. Do not apply any ointments or creams to your incision. You may have skin glue on your incision. Do not peel it off. It will come off on its own in about one week. Your arm may swell a bit after surgery. To reduce swelling use pillows to elevate your arm so it is above your heart. Your doctor will tell you if you need to lightly wrap your arm with an ACE bandage.  Diet  Resume your normal diet. There are not special food restrictions following this procedure. In order to heal from your surgery, it is CRITICAL to get adequate nutrition. Your body requires vitamins, minerals, and protein. Vegetables are the best source of vitamins and minerals. Vegetables also provide the perfect balance of protein. Processed food has little nutritional value, so try to avoid this.  Medications  Resume taking all of your medications. If your incision is causing pain, you may take over-the counter pain relievers such as acetaminophen (Tylenol). If you were prescribed a stronger pain medication, please be aware these medications can cause nausea and constipation. Prevent  nausea by taking the medication with a snack or meal. Avoid constipation by drinking plenty of fluids and eating foods with high amount of fiber, such as fruits, vegetables, and grains.  Do not take Tylenol if you are taking prescription pain medications.  Follow up Your surgeon may want to see you in the office following your access surgery. If so, this will be arranged at the time of your surgery.  Please call us immediately for any of the following conditions:  . Increased pain, redness, drainage (pus) from your incision site . Fever of 101 degrees or higher . Severe or worsening pain at your incision site . Hand pain or numbness. .  Reduce your risk of vascular disease:  . Stop smoking. If you would like help, call [facility] at 1-800-QUIT-NOW 505-503-2806) or [place] at (820) 831-5769  . Manage your cholesterol . Maintain a desired weight . Control your diabetes . Keep your blood pressure down  Dialysis  It will take several weeks to several months for your new dialysis access to be ready for use. Your surgeon will determine when it is okay to use it. Your nephrologist will continue to direct your dialysis. You can continue to use your Permcath until your new access is ready for use.   01/04/2020 Louis Butler [id number] 11-04-1955  Surgeon(s): Serafina Mitchell, MD  Procedure(s): LEFT SECOND STAGE BASCILIC VEIN TRANSPOSITION   May stick graft immediately   May stick graft on designated area only:   X Do not stick left AV fistula for for 6 weeks  If you have any questions, please call the office at [phone].

## 2020-01-05 ENCOUNTER — Encounter (HOSPITAL_COMMUNITY): Payer: Self-pay | Admitting: Surgery

## 2020-01-13 NOTE — Telephone Encounter (Signed)
Called wife. Scheduled him on 10/20 at 11:30 AM, outside of my normal clinic. This works for patient's needs  Wife appreciative Leeanne Rio, MD

## 2020-01-25 ENCOUNTER — Encounter: Payer: Self-pay | Admitting: Family Medicine

## 2020-01-25 ENCOUNTER — Ambulatory Visit (HOSPITAL_COMMUNITY)
Admission: RE | Admit: 2020-01-25 | Discharge: 2020-01-25 | Disposition: A | Discharge status: Home / Self Care | Payer: BC Managed Care – PPO | Source: Ambulatory Visit | Attending: Family Medicine | Admitting: Family Medicine

## 2020-01-25 ENCOUNTER — Other Ambulatory Visit: Payer: Self-pay

## 2020-01-25 ENCOUNTER — Ambulatory Visit (INDEPENDENT_AMBULATORY_CARE_PROVIDER_SITE_OTHER): Payer: BC Managed Care – PPO | Admitting: Family Medicine

## 2020-01-25 VITALS — BP 154/80 | HR 43 | Wt 197.2 lb

## 2020-01-25 DIAGNOSIS — R001 Bradycardia, unspecified: Secondary | ICD-10-CM | POA: Diagnosis present

## 2020-01-25 DIAGNOSIS — N186 End stage renal disease: Secondary | ICD-10-CM | POA: Diagnosis not present

## 2020-01-25 DIAGNOSIS — Z992 Dependence on renal dialysis: Secondary | ICD-10-CM

## 2020-01-25 DIAGNOSIS — E1129 Type 2 diabetes mellitus with other diabetic kidney complication: Secondary | ICD-10-CM

## 2020-01-25 DIAGNOSIS — K31A0 Gastric intestinal metaplasia, unspecified: Secondary | ICD-10-CM

## 2020-01-25 DIAGNOSIS — H6123 Impacted cerumen, bilateral: Secondary | ICD-10-CM

## 2020-01-25 NOTE — Progress Notes (Signed)
  Date of Visit: 01/25/2020   SUBJECTIVE:   HPI:  Louis Butler presents today for routine follow up.  ESRD - tolerating HD reasonably well. Using catheter for access, had recent surgery on his L arm fistula and has f/u appointment in a few months. Feels fatigued on days after has HD. Denies chest pain, shortness of breath, palpitations.  Intestinal metaplasia - had EGD at end of August, next one planned for 2-3 years. He is Taking naproxen for occasional shoulder pain. Says nephrologist Dr. Jimmy Footman told him to take this instead of ibuprofen. Is not sure if GI doctor is ok with him taking NSAIDs.  diabetes - taking levemir 6u daily. Fasting sugars all in low-mid 100s, no preprandial sugars over 200 during the day. No low sugars. Had recent cataract surgery through [facility] on both eyes.  Cerumen impaction - has bilateral impacted cerumen. Occasionally gets itching in ear and reaches in to scratch it. Has not seen ENT for this.  OBJECTIVE:   BP (!) 154/80   Pulse (!) 43   Wt 197 lb 3.2 oz (89.4 kg)   SpO2 98%   BMI 24.65 kg/m  Gen: no acute distress, pleasant, cooperative HEENT: normocephalic, atraumatic. Bilateral ear canals with impacted dry cerumen. Attempted curette removal of cerumen. On R side, after removing a small chunk (with using minimal force) blood noted in canal, with what appears to be laceration or avulsion of canal tissue at 6 o clock position. No bleeding on L ear, challenging to remove any cerumen from this side. Heart: bradycardic, regular rhythm initially. After EKG, regular rate and rhythm Lungs: clear to auscultation bilaterally, normal work of breathing  Neuro: alert speech normal grossly nonfocal  ASSESSMENT/PLAN:   Health maintenance:  -offered and strongly encouraged COVID vaccination, patient declines at this time -eye records reviewed, no retinopathy on 8/30 -reports had flu shot at dialysis  Bradycardia Noted to be quite bradycardic on initial  exam and vitals today, without symptoms attributable to low heart rate. EKG performed to eval for heart block, shows NSR. No further workup or medication adjustments at this time. Continue to monitor vitals with HD 3 times a week.  Cerumen impaction Attempted to gently remove cerumen from R ear, with successful removal of some cerumen, however external canal began to bleed at [facility] position. Unclear whether cerumen had been adherent to canal and removal caused disruption of skin, or whether there was already disruption to that tissue prior to removal. Area hemostatic - advised patient to just monitor. Will refer to ENT for cerumen removal and examination of canals due to concern for underlying pathology prompting bleeding with relatively minimal manipulation.  Intestinal metaplasia of gastric mucosa Reviewed recent EGD and biopsy results in chart. Patient has been taking naproxen; I've asked him to reach out to his GI doctor to see if it is acceptable to be on this in light of his gastric metaplasia.  Diabetes mellitus ([place]) Sugars well controlled on home CBGs. Will not check A1c; less accurate due to ESRD.  ESRD on dialysis ([place]) Tolerating HD  FOLLOW UP: Follow up in 3 mos for above issues Referring to ENT  [place] J. Ardelia Mems, [employer]

## 2020-01-25 NOTE — Patient Instructions (Signed)
It was great to see you again today!  Stay on current medications  Call GI doctor about naproxen and whether it is ok for you to be on this medication.  Follow up with me in 3 months ,sooner if needed  Referring to ENT doctor  Be well, Dr. Ardelia Mems

## 2020-01-28 DIAGNOSIS — N186 End stage renal disease: Secondary | ICD-10-CM | POA: Insufficient documentation

## 2020-01-28 NOTE — Assessment & Plan Note (Signed)
Reviewed recent EGD and biopsy results in chart. Patient has been taking naproxen; I've asked him to reach out to his GI doctor to see if it is acceptable to be on this in light of his gastric metaplasia.

## 2020-01-28 NOTE — Assessment & Plan Note (Signed)
Sugars well controlled on home CBGs. Will not check A1c; less accurate due to ESRD.

## 2020-01-28 NOTE — Assessment & Plan Note (Signed)
Tolerating HD 

## 2020-01-30 ENCOUNTER — Encounter: Payer: Self-pay | Admitting: Physician Assistant

## 2020-01-30 ENCOUNTER — Other Ambulatory Visit: Payer: Self-pay

## 2020-01-30 ENCOUNTER — Ambulatory Visit (INDEPENDENT_AMBULATORY_CARE_PROVIDER_SITE_OTHER): Payer: Self-pay | Admitting: Physician Assistant

## 2020-01-30 VITALS — BP 149/75 | HR 70 | Temp 97.4°F | Resp 20 | Ht 75.0 in | Wt 194.9 lb

## 2020-01-30 DIAGNOSIS — N186 End stage renal disease: Secondary | ICD-10-CM

## 2020-01-30 DIAGNOSIS — Z992 Dependence on renal dialysis: Secondary | ICD-10-CM

## 2020-01-30 NOTE — Progress Notes (Signed)
    Postoperative Access Visit   History of Present Illness   Louis Butler is a 64 y.o. year old male who presents for postoperative follow-up for: left second stage basilic vein transposition by Dr. Trula Slade (Date: 01/04/20).  The patient's wounds are healed.  The patient denies steal symptoms.  The patient is able to complete their activities of daily living.  He is currently dialyzing via R IJ TDC on a TThS schedule at [place] under the care of Dr. Posey Pronto.   Physical Examination   Vitals:   01/30/20 1123  BP: (!) 149/75  Pulse: 70  Resp: 20  Temp: (!) 97.4 F (36.3 C)  TempSrc: Temporal  SpO2: 98%  Weight: 194 lb 14.4 oz (88.4 kg)  Height: 6\' 3"  (1.905 m)   Body mass index is 24.36 kg/m.  left arm Incisions are healed, palpable radial pulse, hand grip is 5/5, sensation in digits is intact, palpable thrill, bruit can be auscultated     Medical Decision Making   Louis Butler is a 63 y.o. year old male who presents s/p left second stage basilic vein transposition   Patent basilic vein fistula without signs or symptoms of steal syndrome  The patient's access will be ready for use 02/09/20  The patient's tunneled dialysis catheter can be removed when Nephrology is comfortable with the performance of the fistula  The patient may follow up on a prn basis   Dagoberto Ligas PA-C [facility] [place] Office: [facility] MD: Trula Slade

## 2020-02-07 ENCOUNTER — Ambulatory Visit: Payer: BC Managed Care – PPO | Admitting: Family Medicine

## 2020-02-07 ENCOUNTER — Encounter: Payer: Self-pay | Admitting: Family Medicine

## 2020-02-07 NOTE — Telephone Encounter (Signed)
Called patient and spoke with his wife. Explained need for obtaining urine sample for UA and urine culture given his multiple medical issues and urologic surgery earlier in the year - very important to be sure we know what is growing in his urine so treatment can be targeted for that.   Visit scheduled for this afternoon at 3:10p with Dr. Vanessa Chesterfield (our only opening today or tomorrow).  If UTI is confirmed/suspected based on today's visit, then would recommend treatment with ciprofloxacin (given prior urine culture growing pseudomonas), while we await the result of today's culture.  Wife says she'll have him here by then and is appreciative.  FYI to Dr. Vanessa Wilmore.   Leeanne Rio, MD

## 2020-02-07 NOTE — Progress Notes (Deleted)
    SUBJECTIVE:   CHIEF COMPLAINT / HPI:  Dysuria Patient is a 64 yo male with a history of BPH and urinary retention s/p TURP in 08/2019. He has a history of UTI with urine cultures growing Pseudomonas.   PERTINENT  PMH / PSH: ***  OBJECTIVE:   There were no vitals taken for this visit.  ***  ASSESSMENT/PLAN:   No problem-specific Assessment & Plan notes found for this encounter.     Louis Butler, [place]

## 2020-02-08 NOTE — Telephone Encounter (Signed)
Pt scheduled for Friday afternoon. Teddrick Mallari Kennon Holter, CMA

## 2020-02-08 NOTE — Telephone Encounter (Signed)
Can you guys contact him to set up an appointment for Thurs afternoon or Friday?  Thanks Leeanne Rio, MD

## 2020-02-10 ENCOUNTER — Ambulatory Visit (INDEPENDENT_AMBULATORY_CARE_PROVIDER_SITE_OTHER): Payer: BC Managed Care – PPO | Admitting: Student in an Organized Health Care Education/Training Program

## 2020-02-10 ENCOUNTER — Encounter: Payer: Self-pay | Admitting: Student in an Organized Health Care Education/Training Program

## 2020-02-10 VITALS — BP 157/80 | HR 90 | Ht 72.0 in

## 2020-02-10 DIAGNOSIS — R339 Retention of urine, unspecified: Secondary | ICD-10-CM | POA: Diagnosis not present

## 2020-02-10 DIAGNOSIS — N186 End stage renal disease: Secondary | ICD-10-CM | POA: Diagnosis not present

## 2020-02-10 DIAGNOSIS — R399 Unspecified symptoms and signs involving the genitourinary system: Secondary | ICD-10-CM | POA: Diagnosis not present

## 2020-02-10 DIAGNOSIS — Z992 Dependence on renal dialysis: Secondary | ICD-10-CM

## 2020-02-10 DIAGNOSIS — N3001 Acute cystitis with hematuria: Secondary | ICD-10-CM | POA: Diagnosis not present

## 2020-02-10 LAB — POCT UA - MICROSCOPIC ONLY

## 2020-02-10 LAB — POCT URINALYSIS DIP (MANUAL ENTRY)
Bilirubin, UA: NEGATIVE
Glucose, UA: NEGATIVE mg/dL
Ketones, POC UA: NEGATIVE mg/dL
Nitrite, UA: NEGATIVE
Protein Ur, POC: 100 mg/dL — AB
Spec Grav, UA: 1.015 (ref 1.010–1.025)
Urobilinogen, UA: 1 E.U./dL
pH, UA: 7.5 (ref 5.0–8.0)

## 2020-02-10 MED ORDER — CIPROFLOXACIN HCL 500 MG PO TABS: 500.0000 mg | ORAL_TABLET | Freq: Every day | ORAL | 0 refills | Status: DC

## 2020-02-10 NOTE — Progress Notes (Signed)
   SUBJECTIVE:   CHIEF COMPLAINT / HPI: UTI concerns  Patient has several days of burning with urination. HD T,Th, S and has not had any fever or vital sign abnormalities. Symptoms are similar to previous UTIs. Has no other complaints or concerns at this time.  OBJECTIVE:   BP (!) 157/80   Pulse 90   Ht 6' (1.829 m)   SpO2 99%   BMI 26.43 kg/m   General: NAD, pleasant, able to participate in exam Cardiac: RRR, normal heart sounds, no murmurs. 2+ radial and PT pulses bilaterally Respiratory: CTAB, normal effort, No wheezes, rales or rhonchi Abdomen: soft, nontender, non-distended. Negative suprapubic pain Extremities: no edema. WWP. Skin: warm and dry, no rashes noted Neuro: alert and oriented, no focal deficits Psych: Normal affect and mood  ASSESSMENT/PLAN:   UTI (urinary tract infection) UTI symptoms with positive dipstick. PCP aware and recommended cipro based on previous cultures. Also positive for blood which is chronic. - sent urine for microscopy and culture.  - prescribed renal dose cipro - given strict precautions for ED presentation to wife and pt. He has HD tomorrow     Richarda Osmond, [place]

## 2020-02-10 NOTE — Patient Instructions (Signed)
It was a pleasure to see you today!  To summarize our discussion for this visit:  We have tested your urine for infection and there are signs of infection and blood. Like we talked about, I will be sending this sample for culture and microscopy for more information.   For today, I will start you on an antibiotic recommended by your PCP Dr. Ardelia Mems.   Please go to the ED if any fever or altered status occurs over the weekend. I will check in early next week.  Some additional health maintenance measures we should update are: Health Maintenance Due  Topic Date Due   COVID-19 Vaccine (1) Never done   HEMOGLOBIN A1C  02/08/2020      Call the clinic at [phone] if your symptoms worsen or you have any concerns.   Thank you for allowing me to take part in your care,  Dr. Doristine Mango

## 2020-02-12 DIAGNOSIS — N390 Urinary tract infection, site not specified: Secondary | ICD-10-CM | POA: Insufficient documentation

## 2020-02-12 NOTE — Assessment & Plan Note (Signed)
UTI symptoms with positive dipstick. PCP aware and recommended cipro based on previous cultures. Also positive for blood which is chronic. - sent urine for microscopy and culture.  - prescribed renal dose cipro - given strict precautions for ED presentation to wife and pt. He has HD tomorrow

## 2020-03-12 ENCOUNTER — Other Ambulatory Visit: Payer: Self-pay

## 2020-03-12 ENCOUNTER — Encounter (INDEPENDENT_AMBULATORY_CARE_PROVIDER_SITE_OTHER): Payer: Self-pay | Admitting: Otolaryngology

## 2020-03-12 ENCOUNTER — Ambulatory Visit (INDEPENDENT_AMBULATORY_CARE_PROVIDER_SITE_OTHER): Payer: BC Managed Care – PPO | Admitting: Otolaryngology

## 2020-03-12 VITALS — Temp 98.2°F

## 2020-03-12 DIAGNOSIS — H6123 Impacted cerumen, bilateral: Secondary | ICD-10-CM

## 2020-03-12 NOTE — Progress Notes (Signed)
HPI: Louis Butler is a 63 y.o. male who presents is referred by his PCP for evaluation of wax buildup and hearing problems.  Patient is noted decreased hearing for several years that is gradually gotten worse especially on the right side.  He was referred here because of wax buildup in his ears..  Past Medical History:  Diagnosis Date  . Arthritis   . Cellulitis 03/14/2019   feet  . Diabetes mellitus without complication ([place])    Type II  . Dialysis patient ([place])    tue,thur,sat  . End-stage kidney disease ([place]) 03/14/2019   TTHSat [address]  . GERD (gastroesophageal reflux disease)   . Heart murmur   . History of blood transfusion   . Hypertension    12/30/19- having low blood pressure since beginning  . Irregular heart beat   . UTI (urinary tract infection)    Past Surgical History:  Procedure Laterality Date  . arm surgery Right    was cut to the bone  . BASCILIC VEIN TRANSPOSITION Left 11/18/2019   Procedure: FIRST STAGE LEFT ARM [place];  Surgeon: Serafina Mitchell, MD;  Location: [facility];  Service: Vascular;  Laterality: Left;  . BASCILIC VEIN TRANSPOSITION Left 01/04/2020   Procedure: LEFT SECOND STAGE BASCILIC VEIN TRANSPOSITION;  Surgeon: Serafina Mitchell, MD;  Location: [facility];  Service: Vascular;  Laterality: Left;  . COLONOSCOPY  06/2019  . EYE SURGERY Bilateral    cataract  . TRANSURETHRAL RESECTION OF PROSTATE N/A 08/08/2019   Procedure: TRANSURETHRAL RESECTION OF THE PROSTATE (TURP);  Surgeon: Lucas Mallow, MD;  Location[facility];  Service: Urology;  Laterality: N/A;  . UPPER GASTROINTESTINAL ENDOSCOPY  06/2019  . WISDOM TOOTH EXTRACTION     Social History   Socioeconomic History  . Marital status: Married    Spouse name: Suanne Marker  . Number of children: 3  . Years of education: Not on file  . Highest education level: Not on file  Occupational History  . Occupation: Investment banker, operational [employer]  Tobacco Use  . Smoking status:  Former Smoker    Packs/day: 1.00    Types: Cigarettes    Quit date: 03/08/2019    Years since quitting: 1.0  . Smokeless tobacco: Never Used  Vaping Use  . Vaping Use: Never used  Substance and Sexual Activity  . Alcohol use: Not Currently    Comment: stopped drinking alcohol - November 2020  . Drug use: No  . Sexual activity: Not on file  Other Topics Concern  . Not on file  Social History Narrative  . Not on file   Social Determinants of Health   Financial Resource Strain:   . Difficulty of Paying Living Expenses: Not on file  Food Insecurity:   . Worried About Charity fundraiser in the Last Year: Not on file  . Ran Out of Food in the Last Year: Not on file  Transportation Needs:   . Lack of Transportation (Medical): Not on file  . Lack of Transportation (Non-Medical): Not on file  Physical Activity:   . Days of Exercise per Week: Not on file  . Minutes of Exercise per Session: Not on file  Stress:   . Feeling of Stress : Not on file  Social Connections:   . Frequency of Communication with Friends and Family: Not on file  . Frequency of Social Gatherings with Friends and Family: Not on file  . Attends Religious Services: Not on file  .  Active Member of Clubs or Organizations: Not on file  . Attends Archivist Meetings: Not on file  . Marital Status: Not on file   Family History  Problem Relation Age of Onset  . Diabetes type II Mother   . Lung cancer Mother   . Hypertension Mother   . Diabetes type II Father   . Diabetes type II Sister   . Diabetes type II Brother   . Hypertension Brother   . Other Son        burned  . CAD Neg Hx   . Colon cancer Neg Hx   . Esophageal cancer Neg Hx   . Inflammatory bowel disease Neg Hx   . Liver disease Neg Hx   . Pancreatic cancer Neg Hx   . Rectal cancer Neg Hx   . Stomach cancer Neg Hx    Allergies  Allergen Reactions  . Hydrocodone Hives and Itching  . Percocet [Oxycodone-Acetaminophen] Hives and Itching    Prior to Admission medications   Medication Sig Start Date End Date Taking? Authorizing Provider  acetaminophen (TYLENOL) 500 MG tablet Take 1,000 mg by mouth every 6 (six) hours as needed for headache (pain).    Yes [provider]  blood glucose meter kit and supplies KIT Dispense based on patient and insurance preference. Use up to four times daily as directed. (FOR ICD-9 250.00, 250.01). 03/28/19  Yes Hongalgi, Lenis Dickinson, MD  carvedilol (COREG) 12.5 MG tablet Take 12.5 mg by mouth 2 (two) times daily.    Yes [provider]  Cholecalciferol (VITAMIN D3 PO) Take 1 tablet by mouth daily.   Yes [provider]  ciprofloxacin (CIPRO) 500 MG tablet Take 1 tablet (500 mg total) by mouth daily with breakfast. 02/10/20  Yes Anderson, Chelsey L, DO  esomeprazole (NEXIUM) 40 MG capsule Take 40 mg by mouth daily at 12 noon.   Yes [provider]  famotidine (PEPCID) 20 MG tablet Take 1 tablet (20 mg total) by mouth daily. Patient taking differently: Take 20 mg by mouth daily as needed.  04/13/19  Yes Pokhrel, Laxman, MD  furosemide (LASIX) 80 MG tablet Take 80 mg by mouth 2 (two) times daily.   Yes [provider]  gabapentin (NEURONTIN) 300 MG capsule Take 1 capsule (300 mg total) by mouth at bedtime. 08/30/19  Yes Leeanne Rio, MD  hydrocortisone 2.5 % cream Apply topically 2 (two) times daily. Patient taking differently: Apply 1 application topically 2 (two) times daily as needed (rash/skin irritation.).  06/14/19  Yes Leeanne Rio, MD  insulin detemir (LEVEMIR) 100 UNIT/ML injection Inject 0.05 mLs (5 Units total) into the skin daily. Patient taking differently: Inject 6 Units into the skin in the morning.  06/14/19  Yes Leeanne Rio, MD  Insulin Syringes, Disposable, U-100 0.5 ML MISC Use as per instructions 3 times daily AC. 03/28/19  Yes Hongalgi, Lenis Dickinson, MD  iron sucrose in sodium chloride 0.9 % 100 mL Iron Sucrose (Venofer) 11/17/19  12/13/20 Yes [provider]  ketorolac (ACULAR) 0.5 % ophthalmic solution Place 1 drop into both eyes 3 (three) times daily.  12/05/19  Yes [provider]  lipase/protease/amylase (CREON) 36000 UNITS CPEP capsule Take 1 capsule (36,000 Units total) by mouth See admin instructions. Take 1 capsule with each snack and meal daily - up to two snacks daily. Patient taking differently: Take 36,000 Units by mouth 3 (three) times daily with meals.  09/27/19  Yes Mansouraty, Telford Nab., MD  loperamide (IMODIUM A-D) 2 MG tablet Take 2 mg by mouth 4 (four) times daily as needed for diarrhea or loose stools.   Yes [provider]  multivitamin (RENA-VIT) TABS tablet Take 1 tablet by mouth daily. 12/20/19  Yes [provider]  naproxen sodium (ALEVE) 220 MG tablet Take 220 mg by mouth 2 (two) times daily as needed (pain).   Yes [provider]  ofloxacin (OCUFLOX) 0.3 % ophthalmic solution Place 1 drop into both eyes in the morning, at noon, and at bedtime.  12/05/19  Yes [provider]  prednisoLONE acetate (PRED FORTE) 1 % ophthalmic suspension Place 1 drop into both eyes in the morning and at bedtime.  12/05/19  Yes [provider]  tamsulosin (FLOMAX) 0.4 MG CAPS capsule Take 0.8 mg by mouth daily.   Yes [provider]  traMADol (ULTRAM) 50 MG tablet Take 1 tablet (50 mg total) by mouth every 6 (six) hours as needed. 01/04/20  Yes Baglia, Corrina, PA-C     Positive ROS: Otherwise negative  All other systems have been reviewed and were otherwise negative with the exception of those mentioned in the HPI and as above.  Physical Exam: Constitutional: Alert, well-appearing, no acute distress Ears: External ears without lesions or tenderness.  L large amount of wax buildup in both ears right side worse than left that was cleaned in the office using suction and curettes as well as forceps.  After cleaning the ears both TMs were clear with good  mobility on pneumatic otoscopy.  On hearing screening with a tuning forks he had mild to moderate hearing loss in both ears. Nasal: External nose without lesions. Septum with mild deviation with mild rhinitis. Clear nasal passages otherwise with no signs of infection. Oral: Lips and gums without lesions. Tongue and palate mucosa without lesions. Posterior oropharynx clear. Neck: No palpable adenopathy or masses Respiratory: Breathing comfortably  Skin: No facial/neck lesions or rash noted.  Cerumen impaction removal  Date/Time: 03/12/2020 6:09 PM Performed by: Rozetta Nunnery, MD Authorized by: Rozetta Nunnery, MD   Consent:    Consent obtained:  Verbal   Consent given by:  Patient   Risks discussed:  Pain and bleeding Procedure details:    Location:  L ear and R ear   Procedure type: curette, suction and forceps   Post-procedure details:    Inspection:  TM intact and canal normal   Hearing quality:  Improved   Patient tolerance of procedure:  Tolerated well, no immediate complications Comments:     TMs are clear bilaterally.    Assessment: Bilateral cerumen buildup.  Plan: The ears were cleaned in the office today.  Briefly discussed with him that if he is having hearing difficulty would recommend obtaining audiologic testing and possible hearing aids at his discretion.   Radene Journey, MD   CC:

## 2020-04-20 ENCOUNTER — Other Ambulatory Visit: Payer: Self-pay | Admitting: Family Medicine

## 2020-04-20 ENCOUNTER — Other Ambulatory Visit: Payer: Self-pay | Admitting: Gastroenterology

## 2020-05-08 ENCOUNTER — Ambulatory Visit: Payer: BC Managed Care – PPO | Admitting: Family Medicine

## 2020-05-14 ENCOUNTER — Telehealth: Payer: Self-pay | Admitting: Family Medicine

## 2020-05-14 NOTE — Telephone Encounter (Signed)
Spoke with patient's wife Louis Butler via phone (she is admitted in hospital).  She reports Louis Butler has been congested and having diarrhea, and has not been to dialysis in 1 week. He has known COVID exposure. Advised he needs to be tested for COVID so that HD center knows his status, but that he should come to ER to be seen due to missing HD for that long. I offered to call Louis Butler to talk with him, but Louis Butler says he does not have a phone on him. She will get a family member to tell him to come to ED for assessment.   Louis Rio, MD

## 2020-06-01 ENCOUNTER — Emergency Department (HOSPITAL_COMMUNITY)
Admission: EM | Admit: 2020-06-01 | Discharge: 2020-06-01 | Disposition: A | Discharge status: Home / Self Care | Payer: BC Managed Care – PPO | Attending: Emergency Medicine | Admitting: Emergency Medicine

## 2020-06-01 ENCOUNTER — Emergency Department (HOSPITAL_COMMUNITY): Payer: BC Managed Care – PPO

## 2020-06-01 ENCOUNTER — Other Ambulatory Visit: Payer: Self-pay

## 2020-06-01 DIAGNOSIS — Z794 Long term (current) use of insulin: Secondary | ICD-10-CM | POA: Diagnosis not present

## 2020-06-01 DIAGNOSIS — Z87891 Personal history of nicotine dependence: Secondary | ICD-10-CM | POA: Diagnosis not present

## 2020-06-01 DIAGNOSIS — Z79899 Other long term (current) drug therapy: Secondary | ICD-10-CM | POA: Diagnosis not present

## 2020-06-01 DIAGNOSIS — N186 End stage renal disease: Secondary | ICD-10-CM | POA: Diagnosis not present

## 2020-06-01 DIAGNOSIS — Z992 Dependence on renal dialysis: Secondary | ICD-10-CM | POA: Diagnosis not present

## 2020-06-01 DIAGNOSIS — E1122 Type 2 diabetes mellitus with diabetic chronic kidney disease: Secondary | ICD-10-CM | POA: Insufficient documentation

## 2020-06-01 DIAGNOSIS — I120 Hypertensive chronic kidney disease with stage 5 chronic kidney disease or end stage renal disease: Secondary | ICD-10-CM | POA: Diagnosis present

## 2020-06-01 DIAGNOSIS — N189 Chronic kidney disease, unspecified: Secondary | ICD-10-CM

## 2020-06-01 LAB — CBC
HCT: 35.2 % — ABNORMAL LOW (ref 39.0–52.0)
Hemoglobin: 11.9 g/dL — ABNORMAL LOW (ref 13.0–17.0)
MCH: 31.6 pg (ref 26.0–34.0)
MCHC: 33.8 g/dL (ref 30.0–36.0)
MCV: 93.4 fL (ref 80.0–100.0)
Platelets: 180 10*3/uL (ref 150–400)
RBC: 3.77 MIL/uL — ABNORMAL LOW (ref 4.22–5.81)
RDW: 14.1 % (ref 11.5–15.5)
WBC: 6.5 10*3/uL (ref 4.0–10.5)
nRBC: 0 % (ref 0.0–0.2)

## 2020-06-01 LAB — BASIC METABOLIC PANEL
Anion gap: 8 (ref 5–15)
BUN: 37 mg/dL — ABNORMAL HIGH (ref 8–23)
CO2: 19 mmol/L — ABNORMAL LOW (ref 22–32)
Calcium: 8.3 mg/dL — ABNORMAL LOW (ref 8.9–10.3)
Chloride: 104 mmol/L (ref 98–111)
Creatinine, Ser: 3.42 mg/dL — ABNORMAL HIGH (ref 0.61–1.24)
GFR, Estimated: 19 mL/min — ABNORMAL LOW (ref >60)
Glucose, Bld: 103 mg/dL — ABNORMAL HIGH (ref 70–99)
Potassium: 4.8 mmol/L (ref 3.5–5.1)
Sodium: 131 mmol/L — ABNORMAL LOW (ref 135–145)

## 2020-06-01 NOTE — ED Notes (Signed)
Pt d/c by MD and is provided with d/c instructions and follow up care, Pt is out of ED ambulatory by self

## 2020-06-01 NOTE — ED Triage Notes (Signed)
Pt here with reports of missing dialysis for the whole month. Pt reports he missed due to a death in his family and then his wife came down with covid. Pt denies chest pain/shob.

## 2020-06-01 NOTE — Discharge Instructions (Signed)
GO TO [facility] AT APPOINTMENT NEXT Friday, 06/08/20, AT 10:15AM WITH PA LINDSAY. THEY WILL INSTRUCT YOU ON WHAT TO DO REGARDING DIALYSIS.  RETURN TO ER IF ANY BREATHING PROBLEMS, CHEST PAIN, OR SEVERE SWELLING OF LEGS.

## 2020-06-01 NOTE — Progress Notes (Signed)
Renal Navigator contacted by EDP/Dr. Rex Kras who states patient has not been to outpatient HD in a month and is here for evaluation--not hypoxic, normal K. Navigator dicussed case with Renal PA/L. Penninger, who notes that patient's SCR and BUN are improved today than a month ago at last HD treatment. Patient was a patient at [facility], however, when Navigator spoke with [facility], learned that clinic staff has made attempts to contact him, including sending welfare checks to the home. Discharge process has been initiated for lost to follow up.  Navigator spoke with inpatient Nephrologist/Dr. Jonnie Finner who recommends no further HD until he has follow up with a provider in the Nephrology office (within 1-2 weeks) for further evaluation and lab work.  Navigator called [facility] and scheduled an appointment with Renal PA/L. Penninger for Friday, March 4th at 10:30am. Patient needs to arrive at 10:15am.  The above has been communicated with Dr. Rex Kras, who states she will communicate with patient.  Alphonzo Cruise, [place] Renal Navigator [phone]

## 2020-06-01 NOTE — ED Provider Notes (Signed)
[facility] EMERGENCY DEPARTMENT Provider Note   CSN: [id number] Arrival date & time: 06/01/20  1316     History No chief complaint on file.   Louis Butler is a 65 y.o. male.  65 year old male with past medical history below including CKD on dialysis, hypertension, type 2 diabetes mellitus, GERD who presents for evaluation after missed dialysis. Patient has missed dialysis for the past 1 month due to death in family and COVID quarantine from exposure. He called his dialysis center today to try to make arrangements for a session and they told him he had to come to the ED for evaluation. He denies any complaints whatsoever including no shortness of breath, dyspnea on exertion, lower extremity edema, weight changes, chest pain, vomiting, fevers, or recent illness. He urinates regularly throughout the day.  The history is provided by the patient.       Past Medical History:  Diagnosis Date  . Arthritis   . Cellulitis 03/14/2019   feet  . Diabetes mellitus without complication ([place])    Type II  . Dialysis patient ([place])    tue,thur,sat  . End-stage kidney disease ([place]) 03/14/2019   TTHSat [facility]  . GERD (gastroesophageal reflux disease)   . Heart murmur   . History of blood transfusion   . Hypertension    12/30/19- having low blood pressure since beginning  . Irregular heart beat   . UTI (urinary tract infection)     Patient Active Problem List   Diagnosis Date Noted  . UTI (urinary tract infection)   . Pain due to onychomycosis of toenails of both feet 12/28/2019  . History of colonic polyps 09/29/2019  . Exocrine pancreatic insufficiency 09/27/2019  . BPH (benign prostatic hyperplasia) 08/08/2019  . Intestinal metaplasia of gastric mucosa 07/25/2019  . Loose bowel movements 05/21/2019  . Indigestion 05/21/2019  . Colon cancer screening 05/21/2019  . Preop examination 05/21/2019  . Calculus of gallbladder without cholecystitis without obstruction  05/21/2019  . Diabetic neuropathy ([place]) 05/16/2019  . Urinary retention 04/15/2019  . Alcohol dependence ([place]) 04/15/2019  . Right renal mass 04/15/2019  . Constipation 03/19/2019  . Tobacco use 03/14/2019  . Rash of both feet 03/14/2019  . ESRD on dialysis ([place]) 03/13/2019  . Diabetes mellitus ([place]) 08/22/2015  . Hypertension 08/22/2015    Past Surgical History:  Procedure Laterality Date  . arm surgery Right    was cut to the bone  . BASCILIC VEIN TRANSPOSITION Left 11/18/2019   Procedure: FIRST STAGE LEFT ARM [place];  Surgeon: Serafina Mitchell, MD;  Location: [facility];  Service: Vascular;  Laterality: Left;  . BASCILIC VEIN TRANSPOSITION Left 01/04/2020   Procedure: LEFT SECOND STAGE BASCILIC VEIN TRANSPOSITION;  Surgeon: Serafina Mitchell, MD;  Location: [facility];  Service: Vascular;  Laterality: Left;  . COLONOSCOPY  06/2019  . EYE SURGERY Bilateral    cataract  . TRANSURETHRAL RESECTION OF PROSTATE N/A 08/08/2019   Procedure: TRANSURETHRAL RESECTION OF THE PROSTATE (TURP);  Surgeon: Lucas Mallow, MD;  [facility];  Service: Urology;  Laterality: N/A;  . UPPER GASTROINTESTINAL ENDOSCOPY  06/2019  . WISDOM TOOTH EXTRACTION         Family History  Problem Relation Age of Onset  . Diabetes type II Mother   . Lung cancer Mother   . Hypertension Mother   . Diabetes type II Father   . Diabetes type II Sister   . Diabetes type II Brother   .  Hypertension Brother   . Other Son        burned  . CAD Neg Hx   . Colon cancer Neg Hx   . Esophageal cancer Neg Hx   . Inflammatory bowel disease Neg Hx   . Liver disease Neg Hx   . Pancreatic cancer Neg Hx   . Rectal cancer Neg Hx   . Stomach cancer Neg Hx     Social History   Tobacco Use  . Smoking status: Former Smoker    Packs/day: 1.00    Types: Cigarettes    Quit date: 03/08/2019    Years since quitting: 1.2  . Smokeless tobacco: Never Used  Vaping Use  . Vaping Use: Never used  Substance  Use Topics  . Alcohol use: Not Currently    Comment: stopped drinking alcohol - November 2020  . Drug use: No    Home Medications Prior to Admission medications   Medication Sig Start Date End Date Taking? Authorizing Provider  acetaminophen (TYLENOL) 500 MG tablet Take 1,000 mg by mouth every 6 (six) hours as needed for headache (pain).     [provider]  blood glucose meter kit and supplies KIT Dispense based on patient and insurance preference. Use up to four times daily as directed. (FOR ICD-9 250.00, 250.01). 03/28/19   Hongalgi, Lenis Dickinson, MD  carvedilol (COREG) 12.5 MG tablet Take 12.5 mg by mouth 2 (two) times daily.     [provider]  Cholecalciferol (VITAMIN D3 PO) Take 1 tablet by mouth daily.    [provider]  ciprofloxacin (CIPRO) 500 MG tablet Take 1 tablet (500 mg total) by mouth daily with breakfast. 02/10/20   Ouida Sills, Chelsey L, DO  esomeprazole (NEXIUM) 40 MG capsule TAKE 1 CAPSULE BY MOUTH ONCE DAILY AT  12  NOON 04/20/20   Mansouraty, Telford Nab., MD  famotidine (PEPCID) 20 MG tablet Take 1 tablet (20 mg total) by mouth daily. Patient taking differently: Take 20 mg by mouth daily as needed.  04/13/19   Pokhrel, Corrie Mckusick, MD  furosemide (LASIX) 80 MG tablet Take 80 mg by mouth 2 (two) times daily.    [provider]  gabapentin (NEURONTIN) 300 MG capsule Take 1 capsule by mouth once daily at bedtime 04/20/20   Leeanne Rio, MD  hydrocortisone 2.5 % cream Apply topically 2 (two) times daily. Patient taking differently: Apply 1 application topically 2 (two) times daily as needed (rash/skin irritation.).  06/14/19   Leeanne Rio, MD  insulin detemir (LEVEMIR) 100 UNIT/ML injection Inject 0.05 mLs (5 Units total) into the skin daily. Patient taking differently: Inject 6 Units into the skin in the morning.  06/14/19   Leeanne Rio, MD  Insulin Syringes, Disposable, U-100 0.5 ML MISC Use as per instructions 3 times daily AC.  03/28/19   Hongalgi, Lenis Dickinson, MD  iron sucrose in sodium chloride 0.9 % 100 mL Iron Sucrose (Venofer) 11/17/19 12/13/20  [provider]  ketorolac (ACULAR) 0.5 % ophthalmic solution Place 1 drop into both eyes 3 (three) times daily.  12/05/19   [provider]  lipase/protease/amylase (CREON) 36000 UNITS CPEP capsule Take 1 capsule (36,000 Units total) by mouth See admin instructions. Take 1 capsule with each snack and meal daily - up to two snacks daily. Patient taking differently: Take 36,000 Units by mouth 3 (three) times daily with meals.  09/27/19   Mansouraty, Telford Nab., MD  loperamide (IMODIUM A-D) 2 MG tablet Take 2 mg by mouth  4 (four) times daily as needed for diarrhea or loose stools.    [provider]  multivitamin (RENA-VIT) TABS tablet Take 1 tablet by mouth daily. 12/20/19   [provider]  naproxen sodium (ALEVE) 220 MG tablet Take 220 mg by mouth 2 (two) times daily as needed (pain).    [provider]  ofloxacin (OCUFLOX) 0.3 % ophthalmic solution Place 1 drop into both eyes in the morning, at noon, and at bedtime.  12/05/19   [provider]  prednisoLONE acetate (PRED FORTE) 1 % ophthalmic suspension Place 1 drop into both eyes in the morning and at bedtime.  12/05/19   [provider]  tamsulosin (FLOMAX) 0.4 MG CAPS capsule Take 0.8 mg by mouth daily.    [provider]  traMADol (ULTRAM) 50 MG tablet Take 1 tablet (50 mg total) by mouth every 6 (six) hours as needed. 01/04/20   Baglia, Corrina, PA-C    Allergies    Hydrocodone and Percocet [oxycodone-acetaminophen]  Review of Systems   Review of Systems All other systems reviewed and are negative except that which was mentioned in HPI  Physical Exam Updated Vital Signs BP (!) 145/83   Pulse 68   Temp 98.6 F (37 C) (Oral)   Resp 18   Ht _0  (1.88 m)   Wt 95.3 kg   SpO2 100%   BMI 26.96 kg/m   Physical Exam Constitutional:      General: He  is not in acute distress.    Appearance: Normal appearance.  HENT:     Head: Normocephalic and atraumatic.  Eyes:     Conjunctiva/sclera: Conjunctivae normal.  Cardiovascular:     Rate and Rhythm: Normal rate and regular rhythm.     Heart sounds: Normal heart sounds. No murmur heard.   Pulmonary:     Effort: Pulmonary effort is normal.     Breath sounds: Normal breath sounds.  Abdominal:     General: Abdomen is flat. Bowel sounds are normal. There is no distension.     Palpations: Abdomen is soft.     Tenderness: There is no abdominal tenderness.  Musculoskeletal:     Right lower leg: No edema.     Left lower leg: No edema.  Skin:    General: Skin is warm and dry.  Neurological:     Mental Status: He is alert and oriented to person, place, and time.     Comments: fluent  Psychiatric:        Mood and Affect: Mood normal.        Behavior: Behavior normal.     ED Results / Procedures / Treatments   Labs (all labs ordered are listed, but only abnormal results are displayed) Labs Reviewed  BASIC METABOLIC PANEL - Abnormal; Notable for the following components:      Result Value   Sodium 131 (*)    CO2 19 (*)    Glucose, Bld 103 (*)    BUN 37 (*)    Creatinine, Ser 3.42 (*)    Calcium 8.3 (*)    GFR, Estimated 19 (*)    All other components within normal limits  CBC - Abnormal; Notable for the following components:   RBC 3.77 (*)    Hemoglobin 11.9 (*)    HCT 35.2 (*)    All other components within normal limits    EKG EKG Interpretation  Date/Time:  Friday June 01 2020 13:54:09 EST Ventricular Rate:  80 PR Interval:  194  QRS Duration: 84 QT Interval:  354 QTC Calculation: 408 R Axis:   -32 Text Interpretation: Normal sinus rhythm Left axis deviation Septal infarct , age undetermined Abnormal ECG since previous tracing, PVCs have resolved Confirmed by [name] [phone]) on 06/01/2020 3:15:01 PM   Radiology DG Chest 2 View  Result Date:  06/01/2020 CLINICAL DATA:  Missed dialysis. EXAM: CHEST - 2 VIEW COMPARISON:  None. FINDINGS: Normal mediastinum and cardiac silhouette. Normal pulmonary vasculature. No evidence of effusion, infiltrate, or pneumothorax. No acute bony abnormality. IMPRESSION: No acute cardiopulmonary process. Electronically Signed   By: Suzy Bouchard M.D.   On: 06/01/2020 14:49    Procedures Procedures   Medications Ordered in ED Medications - No data to display  ED Course  I have reviewed the triage vital signs and the nursing notes.  Pertinent labs & imaging results that were available during my care of the patient were reviewed by me and considered in my medical decision making (see chart for details).    MDM Rules/Calculators/A&P                          Well-appearing on exam, BP 145/83, remainder vital signs normal. 100% on room air, normal work of breathing, clear breath sounds and no respiratory complaints. His chest x-ray is clear. EKG without any concerning features and no evidence of hyperkalemia. Creatinine today is actually slightly improved from labs back in January, 3.4 today versus 4. Potassium 4.8. Hemoglobin 11.9. He has no evidence of significant volume overload and no complaints whatsoever therefore do not feel he needs emergent dialysis. Contacted Terri Piedra, nephrology social worker, who spoke with Dr. Jonnie Finner. They recommended f/u in [facility] next week for further eval and plan. I relayed this plan with the patient and provided him with appointment time and contact information for the clinic. I extensively reviewed return precautions with him and he voiced understanding. Final Clinical Impression(s) / ED Diagnoses Final diagnoses:  Chronic kidney disease, unspecified CKD stage    Rx / DC Orders ED Discharge Orders    None       Zymiere Trostle, Wenda Overland, MD 06/01/20 1710

## 2020-06-04 ENCOUNTER — Telehealth: Payer: Self-pay

## 2020-06-04 NOTE — Telephone Encounter (Signed)
Transition Care Management Unsuccessful Follow-up Telephone Call  Date of discharge and from where:  06/01/2020 from [facility].   Attempts:  1st Attempt  Reason for unsuccessful TCM follow-up call:  Left voice message

## 2020-06-05 NOTE — Telephone Encounter (Signed)
Transition Care Management Follow-up Telephone Call  Date of discharge and from where: 06/01/2020 from [facility]  How have you been since you were released from the hospital? Pt spouse states that patient is doing well. Pt has an appointment to see Kidney Specialist tomorrow. Spouse stated that pt is able to perform ADL's independently.   Any questions or concerns? No  Items Reviewed:  Did the pt receive and understand the discharge instructions provided? Yes   Medications obtained and verified? Yes   Other? No   Any new allergies since your discharge? No   Dietary orders reviewed? Heart and DM Healthy  Do you have support at home? Yes    Functional Questionnaire: (I = Independent and D = Dependent) ADLs: I  Bathing/Dressing- I  Meal Prep- I  Eating- I  Maintaining continence- I  Transferring/Ambulation- I  Managing Meds- I   Follow up appointments reviewed:   PCP Hospital f/u appt confirmed? Yes  Scheduled to see Chrisandra Netters, MD on 07/03/2020 @ 10:30am.  [facility] f/u appt confirmed? Yes  Scheduled to see [facility] on 06/08/2020.  Are transportation arrangements needed? No   If their condition worsens, is the pt aware to call PCP or go to the Emergency Dept.? Yes  Was the patient provided with contact information for the PCP's office or ED? Yes  Was to pt encouraged to call back with questions or concerns? Yes

## 2020-06-06 ENCOUNTER — Encounter: Payer: Self-pay | Admitting: Family Medicine

## 2020-06-12 MED ORDER — LOPERAMIDE HCL 2 MG PO TABS: 2.0000 mg | ORAL_TABLET | Freq: Four times a day (QID) | ORAL | 0 refills | Status: DC | PRN

## 2020-06-29 LAB — HEMOGLOBIN A1C: Hemoglobin A1C: 7

## 2020-07-03 ENCOUNTER — Ambulatory Visit: Payer: BC Managed Care – PPO | Admitting: Family Medicine

## 2020-07-19 ENCOUNTER — Ambulatory Visit (INDEPENDENT_AMBULATORY_CARE_PROVIDER_SITE_OTHER): Payer: BC Managed Care – PPO | Admitting: Family Medicine

## 2020-07-19 ENCOUNTER — Ambulatory Visit (INDEPENDENT_AMBULATORY_CARE_PROVIDER_SITE_OTHER): Payer: BC Managed Care – PPO

## 2020-07-19 ENCOUNTER — Other Ambulatory Visit: Payer: Self-pay

## 2020-07-19 ENCOUNTER — Encounter: Payer: Self-pay | Admitting: Family Medicine

## 2020-07-19 VITALS — BP 122/62 | HR 66 | Wt 195.8 lb

## 2020-07-19 DIAGNOSIS — I10 Essential (primary) hypertension: Secondary | ICD-10-CM | POA: Diagnosis not present

## 2020-07-19 DIAGNOSIS — R21 Rash and other nonspecific skin eruption: Secondary | ICD-10-CM

## 2020-07-19 DIAGNOSIS — Z23 Encounter for immunization: Secondary | ICD-10-CM

## 2020-07-19 DIAGNOSIS — N186 End stage renal disease: Secondary | ICD-10-CM

## 2020-07-19 DIAGNOSIS — Z992 Dependence on renal dialysis: Secondary | ICD-10-CM

## 2020-07-19 DIAGNOSIS — E1129 Type 2 diabetes mellitus with other diabetic kidney complication: Secondary | ICD-10-CM | POA: Diagnosis not present

## 2020-07-19 NOTE — Progress Notes (Signed)
   Covid-19 Vaccination Clinic  Name:  Louis Butler    MRN: [id number] DOB: December 31, 1955  07/19/2020  Mr. Louis Butler was observed post Covid-19 immunization for 15 minutes without incident. He was provided with Vaccine Information Sheet and instruction to access the V-Safe system.   Mr. Louis Butler was instructed to call 911 with any severe reactions post vaccine: Marland Kitchen Difficulty breathing  . Swelling of face and throat  . A fast heartbeat  . A bad rash all over body  . Dizziness and weakness

## 2020-07-19 NOTE — Assessment & Plan Note (Signed)
Pt has been without dialysis since January 20 and is being closely followed by Nephrology and Dr. Posey Pronto, in addition to [facility] transplant evaluation. GFR improved to 17 at last Neph visit last week.  Plan: Continue follow up with Nephrology

## 2020-07-19 NOTE — Assessment & Plan Note (Signed)
Well controlled at this time with thorough at home blood sugar records.   Plan: Continue 6 units Levemir and glucose monitoring

## 2020-07-19 NOTE — Assessment & Plan Note (Addendum)
Lightly erythematous, pruritic rash noted around ears and cheeks. Has responded well to Hydrocortisone in the past.   Plan: Will refill Hydrocortisone today and advised not using this every day but only prn Recommended moisturizer for face, such as light layer of vaseline

## 2020-07-19 NOTE — Assessment & Plan Note (Signed)
Well controlled at this time, after recent Carvedilol increase to 25mg  with Dr. Posey Pronto in Nephrology.  Plan: Continue 25mg  Carvedilol and at home BP checks

## 2020-07-19 NOTE — Patient Instructions (Signed)
It was great to see you again today!  COVID vaccine today. Need appointment in 3 weeks for next vaccine  Pneumonia vaccine today  Follow up with me in 3 months, sooner if needed  Be well, Dr. Ardelia Mems

## 2020-07-19 NOTE — Progress Notes (Signed)
SUBJECTIVE:   CHIEF COMPLAINT / HPI:   Louis Butler returns today for follow up.   ESRD - He has continued kidney transplant evaluation with [facility] and will return in June for cardiology evaluation. Louis Louis Butler has gone without hemodialysis since April 26, 2020 and is doing unexpectedly well. Pt is also urinating about 5 times a day and notes that labs with neph last week showed GFR improved to 17.  Hypertension - He saw his nephrologist Dr. Posey Pronto on 4/6, who increased Louis. Butler's Carvedilol to 25mg . [name] thorough home BP tracker shows improvement to systolics in 825O and 037C since this adjustment.   Diabetes - Louis. Butler thorough reporting of home blood sugar readings show sugars in 100s exclusively except for one reading in the low 200s. He continues on 6 units Levemir and denies any lows.   Face rash - He also endorses a dry, red, occasionally pruritic rash on his face which has improved with Hydroxyzine, though he has run out of this.  OBJECTIVE:   BP 122/62   Pulse 66   Wt 195 lb 12.8 oz (88.8 kg)   SpO2 98%   BMI 25.14 kg/m   Physical Exam Constitutional:      General: He is not in acute distress.    Appearance: Normal appearance. He is not ill-appearing.  HENT:     Head: Normocephalic.  Eyes:     General: No scleral icterus. Cardiovascular:     Rate and Rhythm: Normal rate and regular rhythm.     Pulses: Normal pulses.     Heart sounds: Normal heart sounds.  Pulmonary:     Effort: Pulmonary effort is normal.     Breath sounds: Normal breath sounds. No stridor. No wheezing, rhonchi or rales.  Skin:    General: Skin is warm and dry.  Neurological:     Mental Status: He is alert and oriented to person, place, and time.  Psychiatric:        Mood and Affect: Mood normal.        Behavior: Behavior normal.   mild erythema with slight flaking on face   ASSESSMENT/PLAN:   Hypertension Well controlled at this time, after recent Carvedilol increase to 25mg   with Dr. Posey Pronto in Nephrology.  Plan: Continue 25mg  Carvedilol and at home BP checks  Diabetes mellitus ([place]) Well controlled at this time with thorough at home blood sugar records.   Plan: Continue 6 units Levemir and glucose monitoring  Skin rash Lightly erythematous, pruritic rash noted around ears and cheeks. Has responded well to Hydroxycortisone in the past.   Plan: Will refill Hydroxycortisone today and advised not using this every day but only prn Recommended moisturizer for face, such as light layer of vaseline  ESRD on dialysis [facility]) Pt has been without dialysis since January 20 and is being closely followed by Nephrology and Dr. Posey Pronto, in addition to [facility] transplant evaluation. GFR improved to 17 at last Neph visit last week.  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 Will defer pneumonia vaccine to another time.  [place]    Patient seen along with MS3 student Baldwin [place]. I personally evaluated this patient along with the student, and verified  all aspects of the history, physical exam, and medical decision making as documented by the student. I agree with the student's documentation and have made all necessary edits.  Louis Netters, MD  [place]

## 2020-08-09 ENCOUNTER — Ambulatory Visit (INDEPENDENT_AMBULATORY_CARE_PROVIDER_SITE_OTHER): Payer: BC Managed Care – PPO | Admitting: Family Medicine

## 2020-08-09 ENCOUNTER — Encounter: Payer: Self-pay | Admitting: Family Medicine

## 2020-08-09 ENCOUNTER — Other Ambulatory Visit: Payer: Self-pay

## 2020-08-09 ENCOUNTER — Ambulatory Visit (INDEPENDENT_AMBULATORY_CARE_PROVIDER_SITE_OTHER): Payer: BC Managed Care – PPO

## 2020-08-09 VITALS — BP 125/70 | HR 68 | Ht 74.0 in | Wt 192.6 lb

## 2020-08-09 DIAGNOSIS — Z23 Encounter for immunization: Secondary | ICD-10-CM

## 2020-08-09 DIAGNOSIS — N3000 Acute cystitis without hematuria: Secondary | ICD-10-CM | POA: Diagnosis not present

## 2020-08-09 DIAGNOSIS — R300 Dysuria: Secondary | ICD-10-CM | POA: Diagnosis not present

## 2020-08-09 LAB — POCT URINALYSIS DIP (MANUAL ENTRY)
Bilirubin, UA: NEGATIVE
Glucose, UA: NEGATIVE mg/dL
Ketones, POC UA: NEGATIVE mg/dL
Nitrite, UA: NEGATIVE
Protein Ur, POC: >=300 mg/dL — AB
Spec Grav, UA: 1.025 (ref 1.010–1.025)
Urobilinogen, UA: 0.2 E.U./dL
pH, UA: 6 (ref 5.0–8.0)

## 2020-08-09 LAB — POCT UA - MICROSCOPIC ONLY

## 2020-08-09 MED ORDER — CIPROFLOXACIN HCL 500 MG PO TABS: 500.0000 mg | ORAL_TABLET | Freq: Every day | ORAL | 0 refills | Status: DC

## 2020-08-09 NOTE — Patient Instructions (Signed)
It was great to see you again today!  Sent in ciprofloxacin 500mg  daily for 1 week. Let me know if not doing better on this.  Be well, Dr. Ardelia Mems

## 2020-08-10 ENCOUNTER — Ambulatory Visit: Payer: BC Managed Care – PPO

## 2020-08-10 NOTE — Progress Notes (Signed)
  Date of Visit: 08/09/2020   SUBJECTIVE:   HPI:  Cohl presents today for a same day appointment to discuss possible UTI.  For about a week has had dysuria, urgency, frequency, and burning. Especially occurs at night. Denies fevers or new flank/back pain. Otherwise feels well. Urine has appeared cloudy to him as well. No pain or issues with penis or testicles.  OBJECTIVE:   BP 125/70   Pulse 68   Ht 6\' 2"  (1.88 m)   Wt 192 lb 9.6 oz (87.4 kg)   SpO2 100%   BMI 24.73 kg/m  Gen: no acute distress, pleasant, cooperative Back: no CVA tenderness Abdomen: soft, nontender to palpation   ASSESSMENT/PLAN:   UTI History and UA suggestive of UTI. Patient overall well appearing today. Based on prior culture data will treat with cipro 500mg  daily (renally dosed) Follow up urine culture to confirm speciation & sensitivities.  FOLLOW UP: Follow up as needed if symptoms worsen or do not improve.   [place]. Ardelia Mems, [place]

## 2020-08-15 LAB — URINE CULTURE

## 2020-08-22 ENCOUNTER — Encounter: Payer: Self-pay | Admitting: Family Medicine

## 2020-09-19 DIAGNOSIS — N184 Chronic kidney disease, stage 4 (severe): Secondary | ICD-10-CM | POA: Diagnosis not present

## 2020-09-19 DIAGNOSIS — N2581 Secondary hyperparathyroidism of renal origin: Secondary | ICD-10-CM | POA: Diagnosis not present

## 2020-09-19 DIAGNOSIS — I129 Hypertensive chronic kidney disease with stage 1 through stage 4 chronic kidney disease, or unspecified chronic kidney disease: Secondary | ICD-10-CM | POA: Diagnosis not present

## 2020-09-19 DIAGNOSIS — D631 Anemia in chronic kidney disease: Secondary | ICD-10-CM | POA: Diagnosis not present

## 2020-11-01 ENCOUNTER — Ambulatory Visit (HOSPITAL_COMMUNITY)
Admission: RE | Admit: 2020-11-01 | Discharge: 2020-11-01 | Disposition: A | Discharge status: Home / Self Care | Payer: BC Managed Care – PPO | Source: Ambulatory Visit | Attending: Family Medicine | Admitting: Family Medicine

## 2020-11-01 ENCOUNTER — Other Ambulatory Visit: Payer: Self-pay

## 2020-11-01 ENCOUNTER — Ambulatory Visit (INDEPENDENT_AMBULATORY_CARE_PROVIDER_SITE_OTHER): Payer: BC Managed Care – PPO | Admitting: Family Medicine

## 2020-11-01 VITALS — BP 139/69 | HR 62 | Ht 74.0 in | Wt 196.4 lb

## 2020-11-01 DIAGNOSIS — N3000 Acute cystitis without hematuria: Secondary | ICD-10-CM

## 2020-11-01 DIAGNOSIS — R300 Dysuria: Secondary | ICD-10-CM | POA: Diagnosis not present

## 2020-11-01 DIAGNOSIS — E1129 Type 2 diabetes mellitus with other diabetic kidney complication: Secondary | ICD-10-CM

## 2020-11-01 DIAGNOSIS — R079 Chest pain, unspecified: Secondary | ICD-10-CM

## 2020-11-01 DIAGNOSIS — N186 End stage renal disease: Secondary | ICD-10-CM

## 2020-11-01 DIAGNOSIS — Z23 Encounter for immunization: Secondary | ICD-10-CM | POA: Diagnosis not present

## 2020-11-01 LAB — POCT UA - MICROSCOPIC ONLY

## 2020-11-01 LAB — POCT URINALYSIS DIP (MANUAL ENTRY)
Bilirubin, UA: NEGATIVE
Glucose, UA: NEGATIVE mg/dL
Ketones, POC UA: NEGATIVE mg/dL
Nitrite, UA: NEGATIVE
Protein Ur, POC: >=300 mg/dL — AB
Spec Grav, UA: 1.02 (ref 1.010–1.025)
Urobilinogen, UA: 0.2 E.U./dL
pH, UA: 6 (ref 5.0–8.0)

## 2020-11-01 MED ORDER — CIPROFLOXACIN HCL 500 MG PO TABS: 500.0000 mg | ORAL_TABLET | Freq: Every day | ORAL | 0 refills | Status: AC

## 2020-11-01 MED ORDER — HYDROCORTISONE 2.5 % EX CREA: 1.0000 "application " | TOPICAL_CREAM | Freq: Two times a day (BID) | CUTANEOUS | 1 refills | Status: DC | PRN

## 2020-11-01 NOTE — Patient Instructions (Signed)
It was great to see you again today!  Get cholesterol drawn at [facility] when you go  Pneumonia shot today  Refilled hydrocortisone  Sent in ciprofloxacin for your urine - will also obtain culture and see what grows  Completed disability paperwork today  Follow up with me in 63mos, sooner if needed  Be well, Dr. Ardelia Mems

## 2020-11-01 NOTE — Progress Notes (Signed)
  Date of Visit: 11/01/2020   SUBJECTIVE:   HPI:  Perez presents today for follow up.  Disability papers - needs forms updated for his long term disability. Even though he is no longer on HD he is still very weak and not able to work. Limited in how long he can stand, sit, or do anything of substance. He is being worked up for a renal transplant through [facility]. Has appointment with urology on 9/1. Is taking flomax. Also has some upcoming cardiology testing at [facility] for transplant planning.  Possible UTI - thinks he may have a UTI. Has burning when he pees. No fevers.  Chest pain - mentions at end of visit that he has chest pain about 4-5 times a day. It is sharp. Not associated with sweating, shortness of breath, or nausea. No rhyme or reason to when it comes on, no aggravating or alleviating factors. Recently had stress echo done at [facility], but says chest pain came on after that was done.  Diabetes - currently taking levemir 5u daily. No low sugars. Tolerating well overall.  OBJECTIVE:   BP 139/69   Pulse 62   Ht 6\' 2"  (1.88 m)   Wt 196 lb 6.4 oz (89.1 kg)   SpO2 100%   BMI 25.22 kg/m  Gen: no acute distress, pleasant, cooperative HEENT: normocephalic, atraumatic  Heart: regular rate, occasional sound of two grouped beats (?bigeminy), no murmurs Lungs: clear to auscultation bilaterally, normal work of breathing  Neuro: alert, speech normal, grossly nonfocal Abdomen: soft, nontender to palpation   ASSESSMENT/PLAN:   Health maintenance:  -PCV 20 given today -declines rx for shingrix -update lipids at future [facility] visit (getting labs drawn there next week anyways)  Diabetes mellitus ([place]) Well controlled based on current CBGs Continue present dose of levemir Printed CBG charts for patient  Follow up in 3 months   ESRD (end stage renal disease) ([place]) Remains off HD, being evaluated for renal transplant Completed LTD paperwork today - remains unable to work  UTI (urinary  tract infection) UA today suggestive of possible UTI, as do symptoms Will treat with cipro based on last urine culture Send urine for culture today  Chest pain Pain does not sound cardiac, but certainly has risk factors for CAD Recent stress echo at [facility] was "mildly abnormal" per Care Everywhere Has upcoming further cardiac testing planned but patient nor wife knew details of what this was, and I can't find documentation of what the plan is in Care Everywhere EKG today without findings of acute ischemia Advised patient to call [place] cardiology to update them on these symptoms in case they want to do testing sooner Discussed ED return precautions  FOLLOW UP: Follow up in 3 mos for routine medical problems  [place]. Ardelia Mems, [place]

## 2020-11-05 ENCOUNTER — Encounter: Payer: Self-pay | Admitting: Family Medicine

## 2020-11-06 DIAGNOSIS — N184 Chronic kidney disease, stage 4 (severe): Secondary | ICD-10-CM | POA: Diagnosis not present

## 2020-11-06 DIAGNOSIS — R079 Chest pain, unspecified: Secondary | ICD-10-CM | POA: Insufficient documentation

## 2020-11-06 DIAGNOSIS — E1129 Type 2 diabetes mellitus with other diabetic kidney complication: Secondary | ICD-10-CM | POA: Diagnosis not present

## 2020-11-06 LAB — URINE CULTURE

## 2020-11-06 NOTE — Assessment & Plan Note (Signed)
Well controlled based on current CBGs Continue present dose of levemir Printed CBG charts for patient  Follow up in 3 months

## 2020-11-06 NOTE — Assessment & Plan Note (Signed)
UA today suggestive of possible UTI, as do symptoms Will treat with cipro based on last urine culture Send urine for culture today

## 2020-11-06 NOTE — Assessment & Plan Note (Signed)
Pain does not sound cardiac, but certainly has risk factors for CAD Recent stress echo at [facility] was "mildly abnormal" per Care Everywhere Has upcoming further cardiac testing planned but patient nor wife knew details of what this was, and I can't find documentation of what the plan is in Care Everywhere EKG today without findings of acute ischemia Advised patient to call [place] cardiology to update them on these symptoms in case they want to do testing sooner Discussed ED return precautions

## 2020-11-06 NOTE — Assessment & Plan Note (Signed)
Remains off HD, being evaluated for renal transplant Completed LTD paperwork today - remains unable to work

## 2020-11-14 DIAGNOSIS — D631 Anemia in chronic kidney disease: Secondary | ICD-10-CM | POA: Diagnosis not present

## 2020-11-14 DIAGNOSIS — I129 Hypertensive chronic kidney disease with stage 1 through stage 4 chronic kidney disease, or unspecified chronic kidney disease: Secondary | ICD-10-CM | POA: Diagnosis not present

## 2020-11-14 DIAGNOSIS — N184 Chronic kidney disease, stage 4 (severe): Secondary | ICD-10-CM | POA: Diagnosis not present

## 2020-11-14 DIAGNOSIS — N2581 Secondary hyperparathyroidism of renal origin: Secondary | ICD-10-CM | POA: Diagnosis not present

## 2020-11-15 ENCOUNTER — Telehealth: Payer: Self-pay | Admitting: Family Medicine

## 2020-11-15 DIAGNOSIS — Z961 Presence of intraocular lens: Secondary | ICD-10-CM | POA: Diagnosis not present

## 2020-11-15 DIAGNOSIS — H26491 Other secondary cataract, right eye: Secondary | ICD-10-CM | POA: Diagnosis not present

## 2020-11-15 LAB — HM DIABETES EYE EXAM

## 2020-11-15 NOTE — Telephone Encounter (Signed)
Attempted to reach patient to discuss urine culture results Grew resistant pseudomonas - discussed with pharmacy, no options for treatment orally except fosfomycin Called [facility] and they cannot test against fosfomycin No answer; LVM asking him to call back If symptoms are resolved will plan to just be done with treatment If symptoms persist will likely rx fosfomycin, but want to speak with patient first  Leeanne Rio, MD

## 2020-11-16 NOTE — Telephone Encounter (Signed)
Late entry. Spoke with patient this morning. He reports he is feeling much better after tx with cipro Advised if symptoms return will need another urine culture. Patient appreciative  Leeanne Rio, MD

## 2020-12-03 ENCOUNTER — Other Ambulatory Visit: Payer: Self-pay | Admitting: Family Medicine

## 2020-12-06 ENCOUNTER — Encounter: Payer: Self-pay | Admitting: Family Medicine

## 2020-12-06 NOTE — Telephone Encounter (Signed)
It appears they ordered a urine culture at [facility], so they should be able to follow up and give him the appropriate antibiotics. He had a very challenging culture results - there were no "oral" options that his bacteria was sensitive to, and since his symptoms had improved when I spoke with him about it, we deemed it adequately treated.  Can you let patient/wife know this?  Thanks! Leeanne Rio, MD

## 2020-12-07 NOTE — Telephone Encounter (Signed)
Called patient's wife, informed to follow up with [facility] for treatment of UTI. Wife verbalizes  understanding.   Talbot Grumbling, RN

## 2020-12-17 ENCOUNTER — Other Ambulatory Visit: Payer: Self-pay | Admitting: Family Medicine

## 2021-01-14 DIAGNOSIS — I129 Hypertensive chronic kidney disease with stage 1 through stage 4 chronic kidney disease, or unspecified chronic kidney disease: Secondary | ICD-10-CM | POA: Diagnosis not present

## 2021-01-14 DIAGNOSIS — N2581 Secondary hyperparathyroidism of renal origin: Secondary | ICD-10-CM | POA: Diagnosis not present

## 2021-01-14 DIAGNOSIS — N390 Urinary tract infection, site not specified: Secondary | ICD-10-CM | POA: Diagnosis not present

## 2021-01-14 DIAGNOSIS — D631 Anemia in chronic kidney disease: Secondary | ICD-10-CM | POA: Diagnosis not present

## 2021-01-14 DIAGNOSIS — N184 Chronic kidney disease, stage 4 (severe): Secondary | ICD-10-CM | POA: Diagnosis not present

## 2021-01-17 ENCOUNTER — Encounter: Payer: Self-pay | Admitting: Family Medicine

## 2021-01-17 ENCOUNTER — Ambulatory Visit (INDEPENDENT_AMBULATORY_CARE_PROVIDER_SITE_OTHER): Payer: BC Managed Care – PPO | Admitting: Family Medicine

## 2021-01-17 ENCOUNTER — Other Ambulatory Visit: Payer: Self-pay

## 2021-01-17 ENCOUNTER — Ambulatory Visit (INDEPENDENT_AMBULATORY_CARE_PROVIDER_SITE_OTHER): Payer: BC Managed Care – PPO

## 2021-01-17 VITALS — BP 135/69 | HR 35 | Wt 200.0 lb

## 2021-01-17 DIAGNOSIS — E1142 Type 2 diabetes mellitus with diabetic polyneuropathy: Secondary | ICD-10-CM | POA: Diagnosis not present

## 2021-01-17 DIAGNOSIS — I10 Essential (primary) hypertension: Secondary | ICD-10-CM

## 2021-01-17 DIAGNOSIS — Z23 Encounter for immunization: Secondary | ICD-10-CM | POA: Diagnosis not present

## 2021-01-17 DIAGNOSIS — R300 Dysuria: Secondary | ICD-10-CM | POA: Diagnosis not present

## 2021-01-17 DIAGNOSIS — E1129 Type 2 diabetes mellitus with other diabetic kidney complication: Secondary | ICD-10-CM

## 2021-01-17 LAB — POCT URINALYSIS DIP (MANUAL ENTRY)
Bilirubin, UA: NEGATIVE
Glucose, UA: NEGATIVE mg/dL
Ketones, POC UA: NEGATIVE mg/dL
Nitrite, UA: NEGATIVE
Protein Ur, POC: >=300 mg/dL — AB
Spec Grav, UA: 1.02 (ref 1.010–1.025)
Urobilinogen, UA: 0.2 E.U./dL
pH, UA: 6 (ref 5.0–8.0)

## 2021-01-17 LAB — POCT GLYCOSYLATED HEMOGLOBIN (HGB A1C): HbA1c, POC (controlled diabetic range): 6.7 % (ref 0.0–7.0)

## 2021-01-17 LAB — POCT UA - MICROSCOPIC ONLY

## 2021-01-17 MED ORDER — SHINGRIX 50 MCG/0.5ML IM SUSR: 0.5000 mL | Freq: Once | INTRAMUSCULAR | 1 refills | Status: AC

## 2021-01-17 NOTE — Patient Instructions (Signed)
It was great to see you again today!  Stay on current medications  Keep feet clean and well moisturized, check them every day for cuts/sores  Waiting on urine results, will take a few days  COVID booster and flu shot given today  Follow up with me in March or April, sooner if needed  Be well, Dr. Ardelia Mems

## 2021-01-17 NOTE — Progress Notes (Signed)
  Date of Visit: 01/17/2021   SUBJECTIVE:   HPI:  Dois presents today for routine follow up.  Diabetes - currently taking levemir 6u daily. Fasting sugars are in the 100s.   Neuropathy - taking gabapentin 300mg  at night. Tolerating well, it helps his symptoms.  Hypertension - taking coreg 25mg  twice daily. Readings at home often run 140s/90s but reading today is normal.  UTI symptoms - thinks he has another UTI. Endorses dysuria. No fevers. Was treated at [facility] for pseudomonas UTI last month. He does not recall what medication he took. Symptoms improved after taking that, then worsened after stopping. Has had multiple recent UTIs requiring antibiotics. Follows with urology at [facility].   OBJECTIVE:   BP 135/69   Pulse (!) 35   Wt 200 lb (90.7 kg)   SpO2 99%   BMI 25.68 kg/m  Manual pulse on exam = 54 Gen: no acute distress, pleasant, cooperative HEENT: normocephalic, atraumatic  Heart: regular rate and rhythm, no murmur Lungs: clear to auscultation bilaterally nwob Neuro: alert,s peech normal Back: no cva tenderness Abdomen: soft nontender to palpation  Ext: No appreciable lower extremity edema bilaterally  Diabetic Foot Exam - Simple   Simple Foot Form Diabetic Foot exam was performed with the following findings: Yes 01/17/2021 12:00 PM  Visual Inspection See comments: Yes Sensation Testing See comments: Yes Pulse Check Posterior Tibialis and Dorsalis pulse intact bilaterally: Yes Comments Dry flaky skin on feet. Diminished sensation with monofilament testing bilaterally      ASSESSMENT/PLAN:   Health maintenance:  -discussed shingrix, patient wants to hold off for now -flu shot given today  -recent eye exam via [facility] (in care everywhere) -bivalent COVID booster given today -foot exam done today, advised to look at feet daily given decreased sensation  Diabetes mellitus ([place]) Well controlled by A1c (6.7) as well as fasting sugars Continue current  dose of levemir  Diabetic neuropathy ([place]) Well controlled symptoms with gabapentin Discussed daily foot care in light of decreased sensation  Hypertension Well controlled. Continue current medication regimen.   UTI symptoms Given multiple recent UTIs and antibiotics, with growth of resistant organisms, will await culture data before deciding to treat. Patient is systemically well at this time.  FOLLOW UP: Follow up in 6 months, sooner if needed  [place]. Ardelia Mems, [place]

## 2021-01-18 DIAGNOSIS — Z23 Encounter for immunization: Secondary | ICD-10-CM | POA: Diagnosis not present

## 2021-01-22 NOTE — Assessment & Plan Note (Signed)
Well controlled by A1c (6.7) as well as fasting sugars Continue current dose of levemir

## 2021-01-22 NOTE — Assessment & Plan Note (Signed)
Well controlled symptoms with gabapentin Discussed daily foot care in light of decreased sensation

## 2021-01-22 NOTE — Assessment & Plan Note (Signed)
Well controlled. Continue current medication regimen.  

## 2021-01-25 ENCOUNTER — Other Ambulatory Visit: Payer: Self-pay | Admitting: Gastroenterology

## 2021-01-26 LAB — URINE CULTURE

## 2021-01-30 ENCOUNTER — Telehealth: Payer: Self-pay | Admitting: Family Medicine

## 2021-01-30 MED ORDER — FOSFOMYCIN TROMETHAMINE 3 G PO PACK: 1.5000 g | PACK | ORAL | 0 refills | Status: AC

## 2021-01-30 NOTE — Telephone Encounter (Signed)
Called patient to discuss urine culture, which grew multi drug resistant pseudomonas. Spoke with Dr. Valentina Lucks who recommended fosfomycin, renally dosed at 1.5g once, repeated 2 days later. Informed patient of this plan  It appears [facility] also sent him in a dose of fosfomycin to take in 1 month prior to his urodynamics study - limited records visible in [place].  Explained the difference between these two prescriptions to patient.  Questions answered.  Patient appreciative.  Leeanne Rio, MD

## 2021-01-31 DIAGNOSIS — I129 Hypertensive chronic kidney disease with stage 1 through stage 4 chronic kidney disease, or unspecified chronic kidney disease: Secondary | ICD-10-CM | POA: Diagnosis not present

## 2021-01-31 DIAGNOSIS — R935 Abnormal findings on diagnostic imaging of other abdominal regions, including retroperitoneum: Secondary | ICD-10-CM | POA: Diagnosis not present

## 2021-01-31 DIAGNOSIS — N289 Disorder of kidney and ureter, unspecified: Secondary | ICD-10-CM | POA: Diagnosis not present

## 2021-01-31 DIAGNOSIS — I515 Myocardial degeneration: Secondary | ICD-10-CM | POA: Diagnosis not present

## 2021-01-31 DIAGNOSIS — Z79899 Other long term (current) drug therapy: Secondary | ICD-10-CM | POA: Diagnosis not present

## 2021-01-31 DIAGNOSIS — R918 Other nonspecific abnormal finding of lung field: Secondary | ICD-10-CM | POA: Diagnosis not present

## 2021-01-31 DIAGNOSIS — E1122 Type 2 diabetes mellitus with diabetic chronic kidney disease: Secondary | ICD-10-CM | POA: Diagnosis not present

## 2021-01-31 DIAGNOSIS — Z01810 Encounter for preprocedural cardiovascular examination: Secondary | ICD-10-CM | POA: Diagnosis not present

## 2021-01-31 DIAGNOSIS — N189 Chronic kidney disease, unspecified: Secondary | ICD-10-CM | POA: Diagnosis not present

## 2021-02-07 ENCOUNTER — Encounter: Payer: Self-pay | Admitting: Family Medicine

## 2021-02-07 NOTE — Telephone Encounter (Signed)
Forms completed, will return to RN team to fax & scan in copy Please let patient know forms have been completed  Thanks!  Leeanne Rio, MD

## 2021-03-07 DIAGNOSIS — N184 Chronic kidney disease, stage 4 (severe): Secondary | ICD-10-CM | POA: Diagnosis not present

## 2021-03-15 DIAGNOSIS — N184 Chronic kidney disease, stage 4 (severe): Secondary | ICD-10-CM | POA: Diagnosis not present

## 2021-03-15 DIAGNOSIS — I129 Hypertensive chronic kidney disease with stage 1 through stage 4 chronic kidney disease, or unspecified chronic kidney disease: Secondary | ICD-10-CM | POA: Diagnosis not present

## 2021-03-15 DIAGNOSIS — N2581 Secondary hyperparathyroidism of renal origin: Secondary | ICD-10-CM | POA: Diagnosis not present

## 2021-03-15 DIAGNOSIS — D631 Anemia in chronic kidney disease: Secondary | ICD-10-CM | POA: Diagnosis not present

## 2021-03-15 NOTE — Hospital Course (Signed)
error 

## 2021-03-20 DIAGNOSIS — R339 Retention of urine, unspecified: Secondary | ICD-10-CM | POA: Diagnosis not present

## 2021-03-20 DIAGNOSIS — N390 Urinary tract infection, site not specified: Secondary | ICD-10-CM | POA: Diagnosis not present

## 2021-03-21 ENCOUNTER — Encounter: Payer: Self-pay | Admitting: Family Medicine

## 2021-03-24 IMAGING — US US RENAL
1 series · 14 of 25 positions shown · non-contrast
Comparison: None.

CLINICAL DATA: 63-year-old male with acute renal failure. History
of diabetes.

EXAM:
RENAL / URINARY TRACT ULTRASOUND COMPLETE

[Series 1: us renal · 14 of 42 slices shown]
[im 1/42]
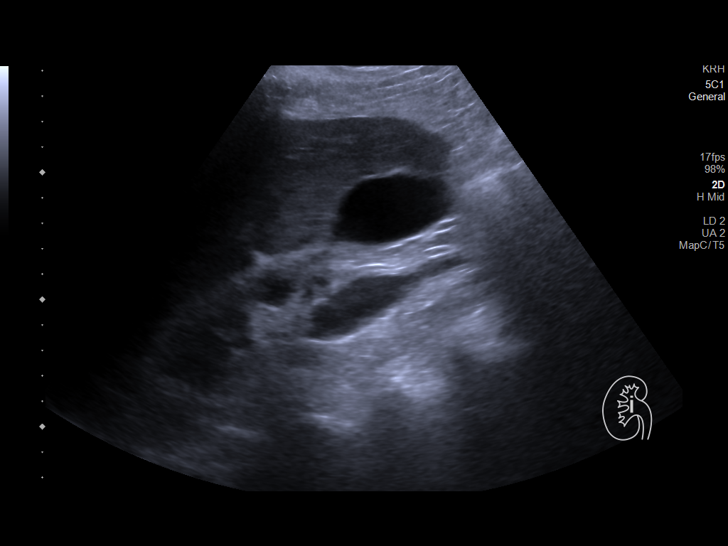
[im 4/42]
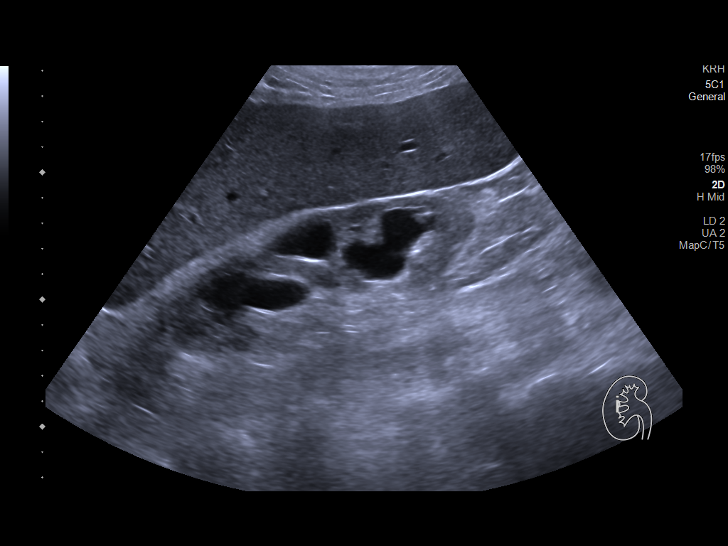
[im 7/42]
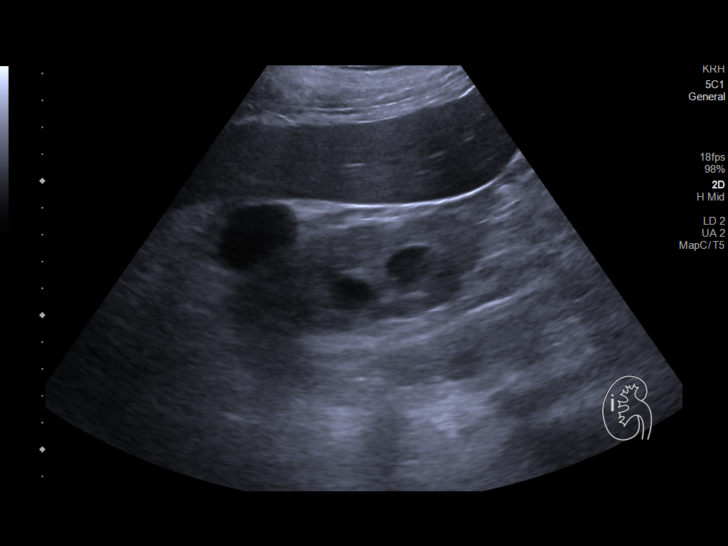
[im 11/42]
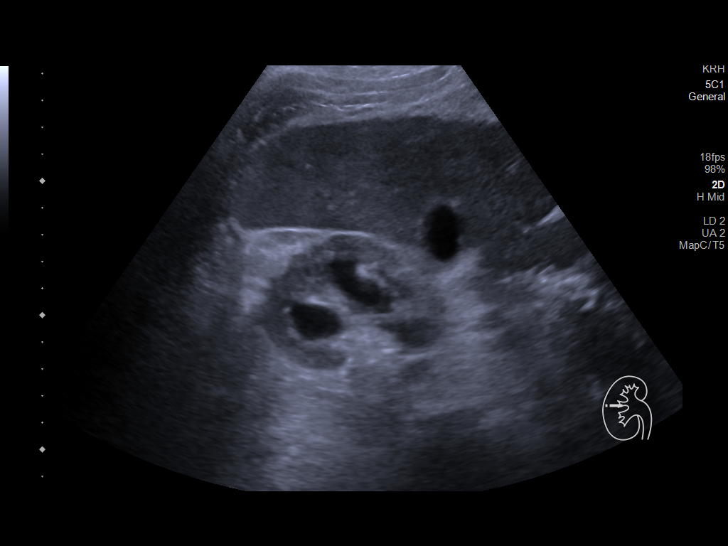
[im 14/42]
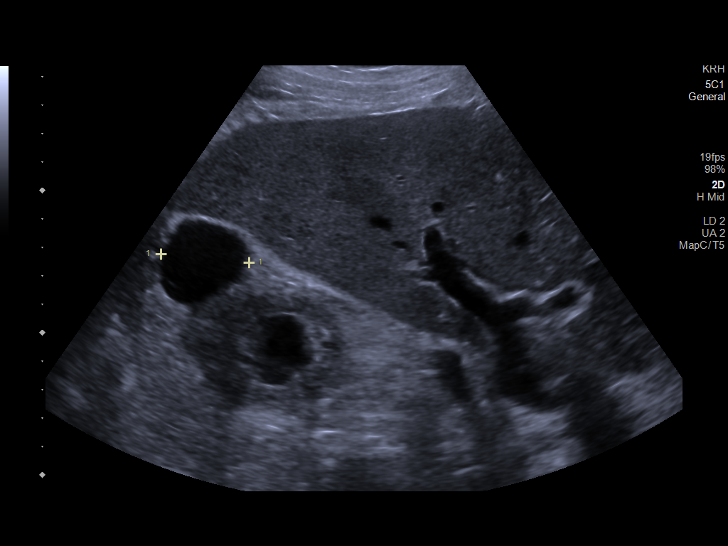
[im 16/42]
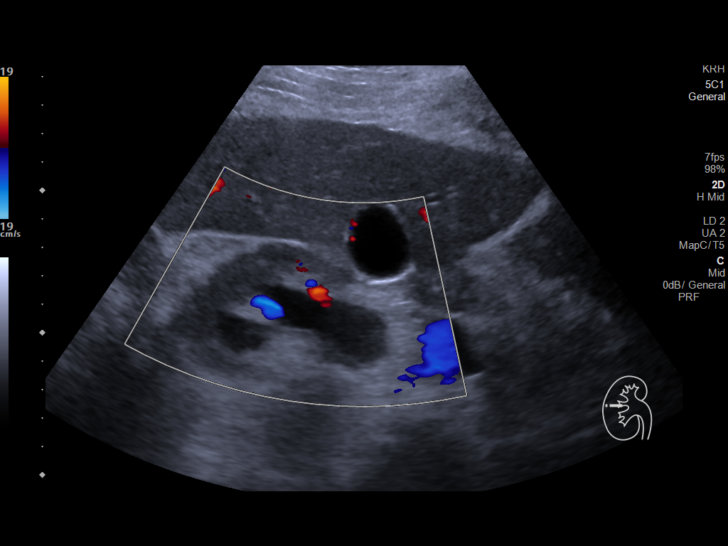
[im 19/42]
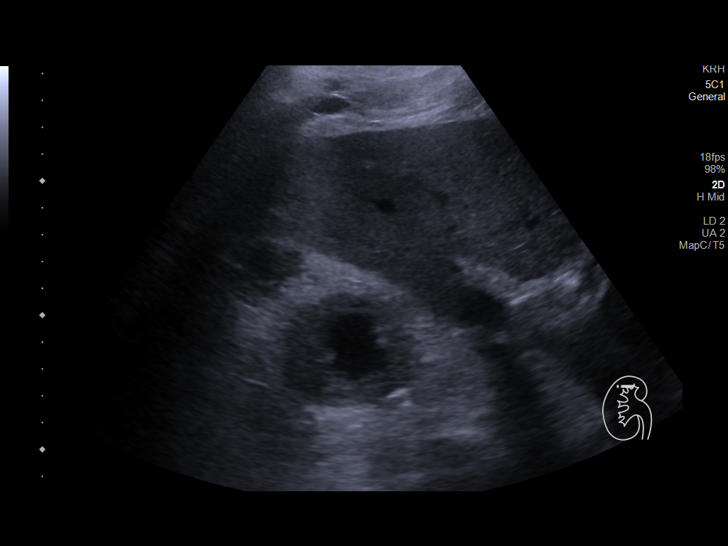
[im 23/42]
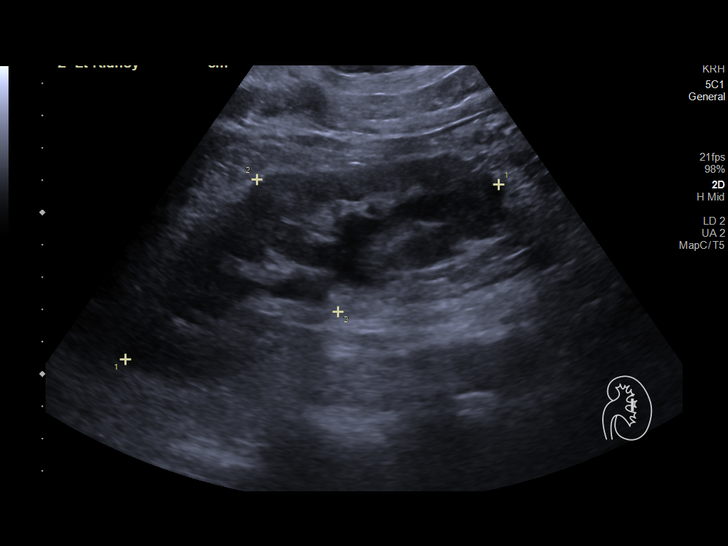
[im 26/42]
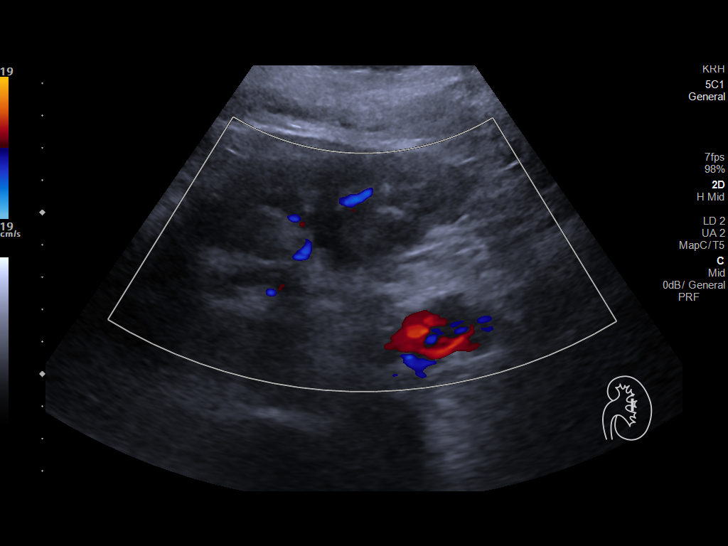
[im 28/42]
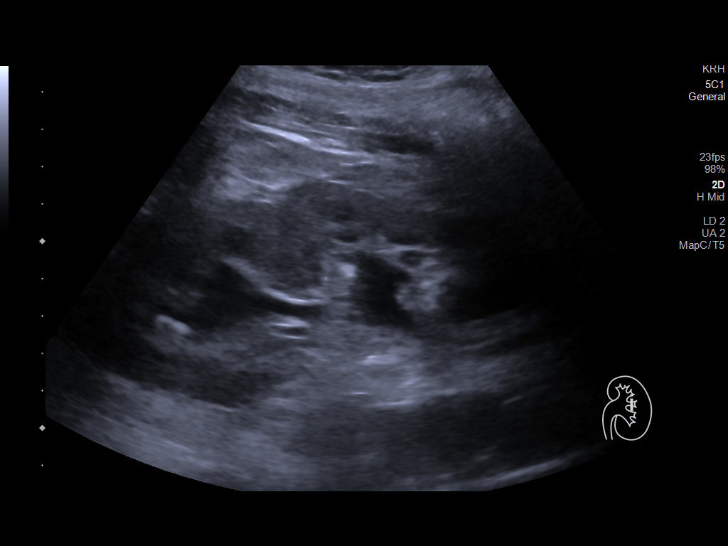
[im 31/42]
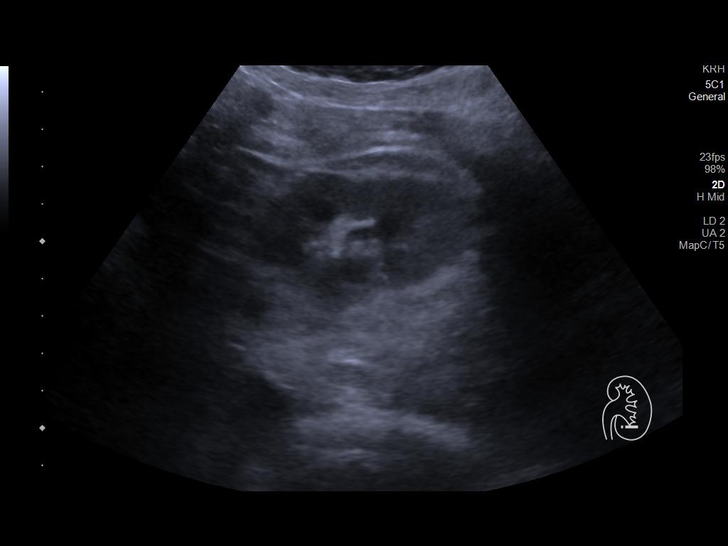
[im 35/42]
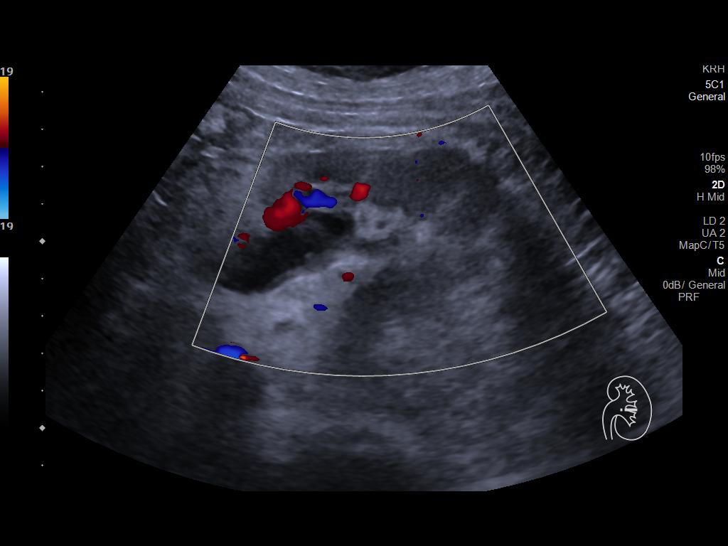
[im 38/42]
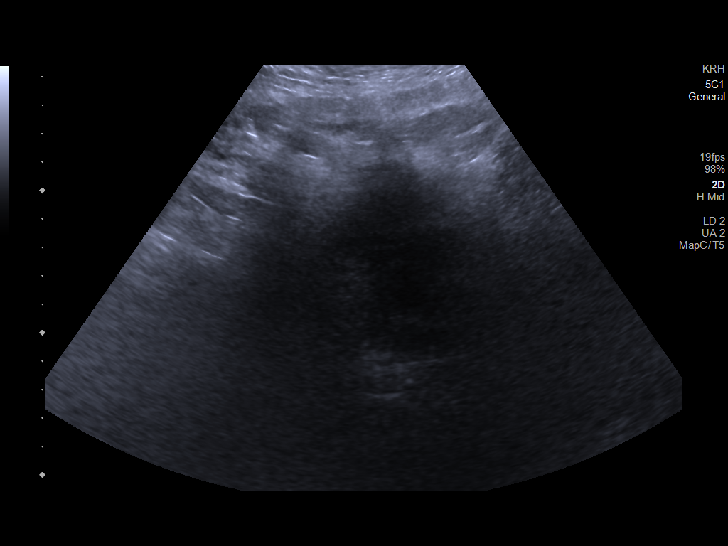
[im 42/42]
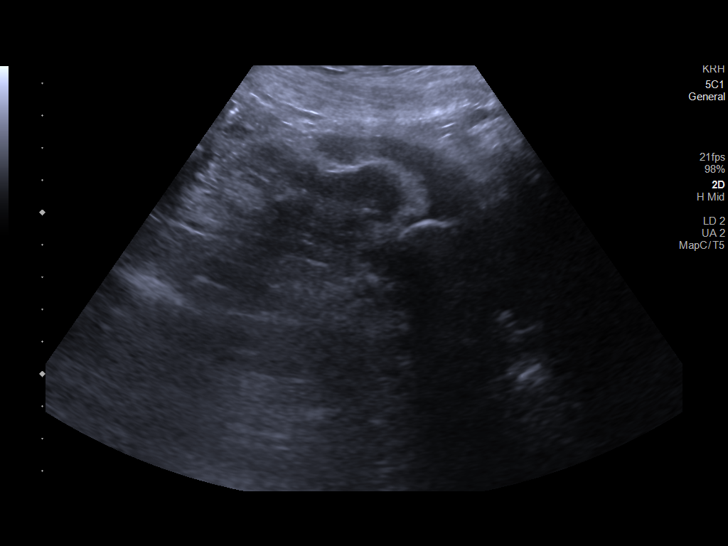

[14 of 25 positions shown; findings below may reference images not displayed]

FINDINGS: Evaluation is limited as the patient was not able to cooperate with
exam and due to portable technique.

Right Kidney:

Renal measurements: 12.6 x 4.4 x 4.1 cm = volume: 117 mL. There is
moderate parenchyma atrophy. There is increased renal parenchymal
echogenicity. There is a 3.3 x 2.4 x 3.1 cm upper pole cyst. There
is moderate right hydronephrosis. No shadowing stone.

Left Kidney:

Renal measurements: 12.8 x 4.8 x 5.1 cm = volume: 164 ML. There is
mild parenchyma atrophy and cortical thinning. There is increased
renal parenchymal echogenicity. Mild-to-moderate hydronephrosis. No
shadowing stone.

Bladder:

Not well visualized.

Other:

None.
IMPRESSION: 1. Increased renal parenchyma echogenicity in keeping with chronic
kidney disease.
2. Bilateral hydronephrosis, right greater left. No shadowing stone.

## 2021-03-25 IMAGING — CT CT ABD-PELV W/O CM
2 of 4 series · 16 of 46 positions shown, 18 images · non-contrast
Comparison: 03/13/2019 renal ultrasound.  No prior CT.

CLINICAL DATA: Renal failure.  Anemia.  Microscopic hematuria.

EXAM:
CT ABDOMEN AND PELVIS WITHOUT CONTRAST
TECHNIQUE: Multidetector CT imaging of the abdomen and pelvis was performed
following the standard protocol without IV contrast.

[Series 3: a/p w/o 5mm · axial · non-contrast · 0.93mm/px · z∈[+1089,+1559]mm · 13 of 104 slices shown, 15 images]
[im 5/104  soft-tissue]
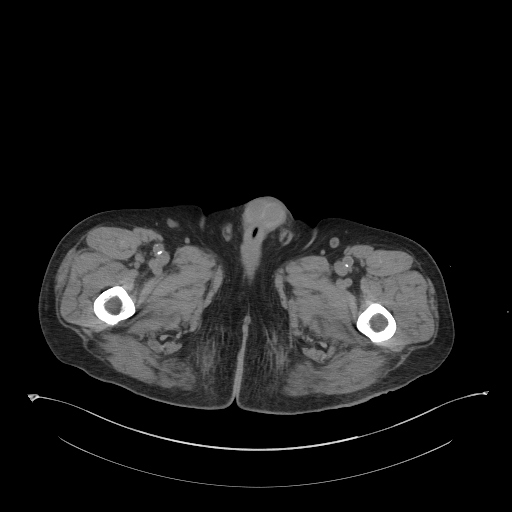
[im 5/104  bone]
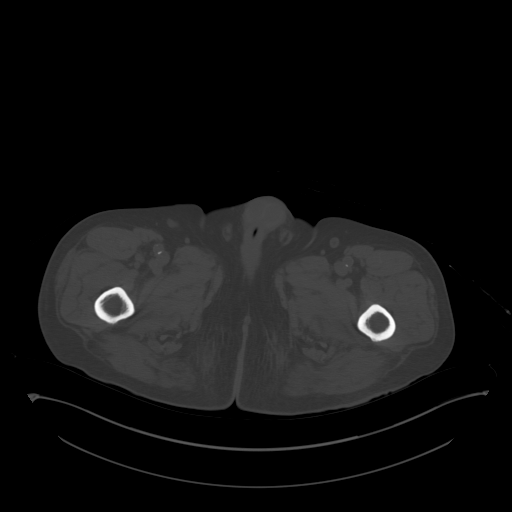
[im 13/104  soft-tissue]
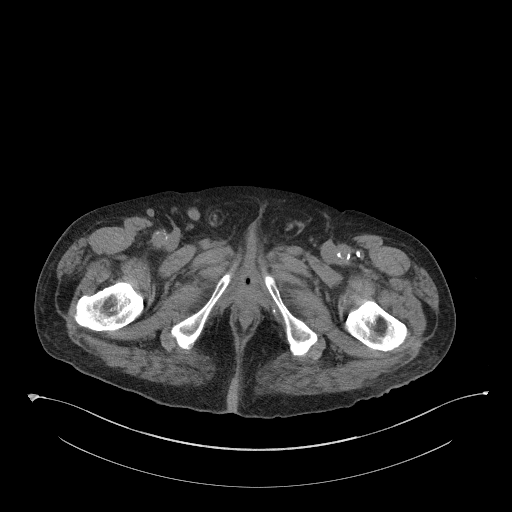
[im 21/104  soft-tissue]
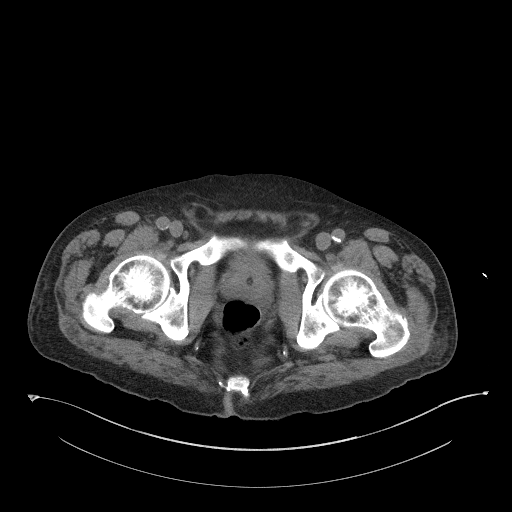
[im 29/104  soft-tissue]
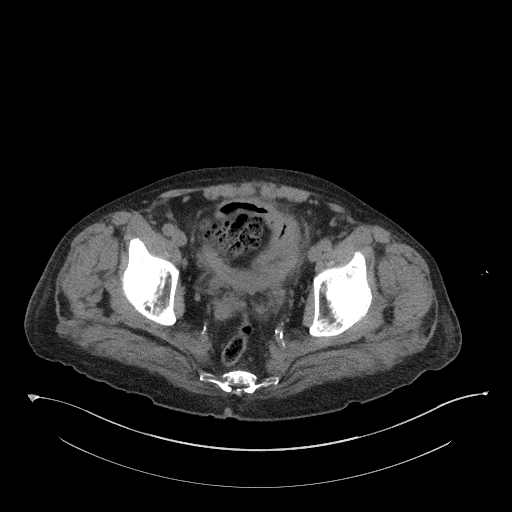
[im 38/104  soft-tissue]
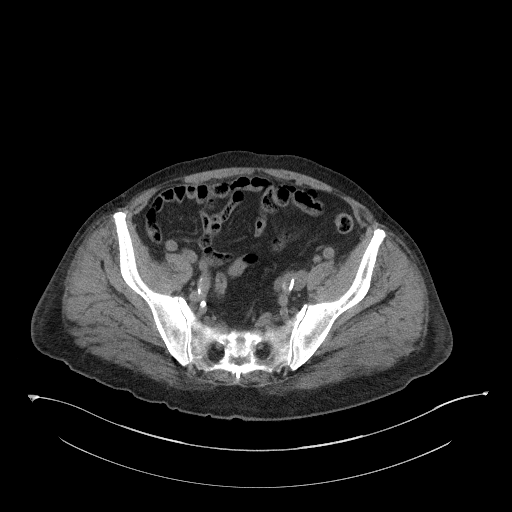
[im 46/104  soft-tissue]
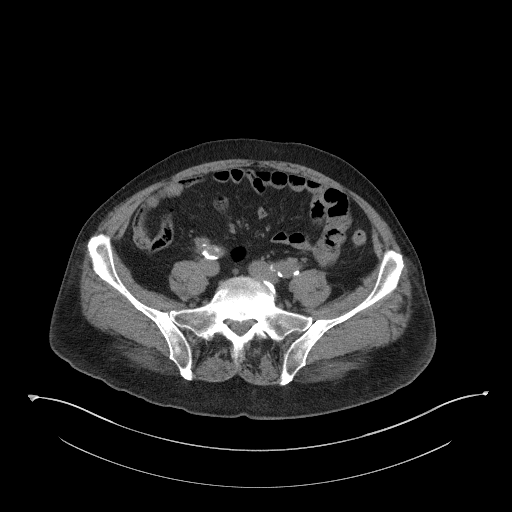
[im 54/104  soft-tissue]
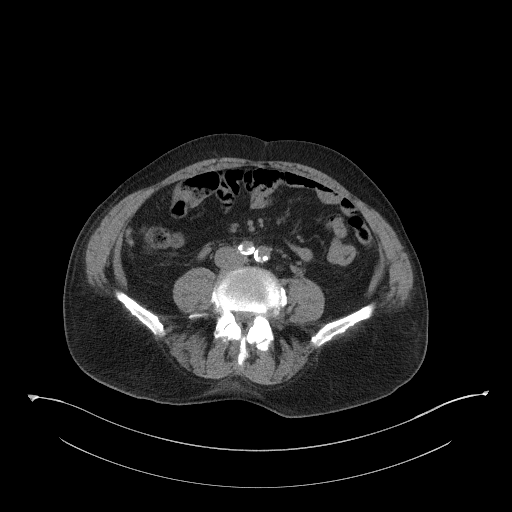
[im 58/104  soft-tissue]
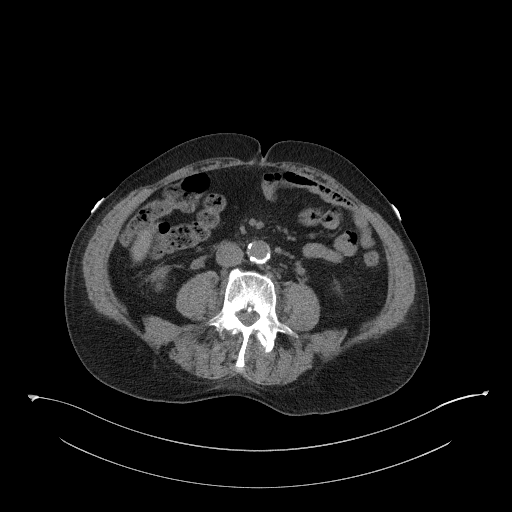
[im 66/104  soft-tissue]
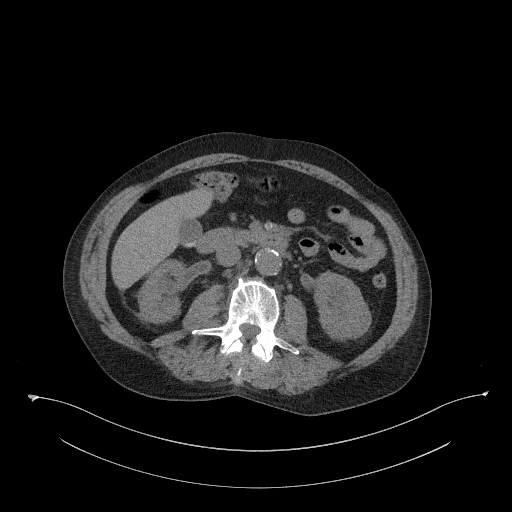
[im 66/104  bone]
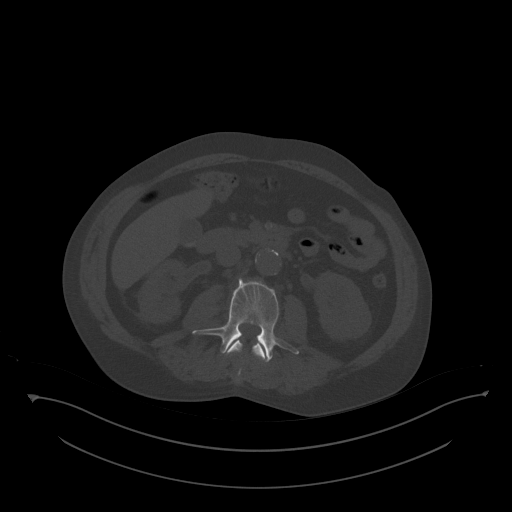
[im 75/104  soft-tissue]
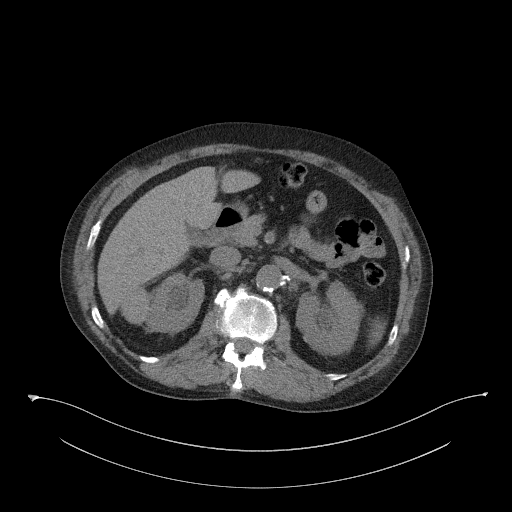
[im 83/104  soft-tissue]
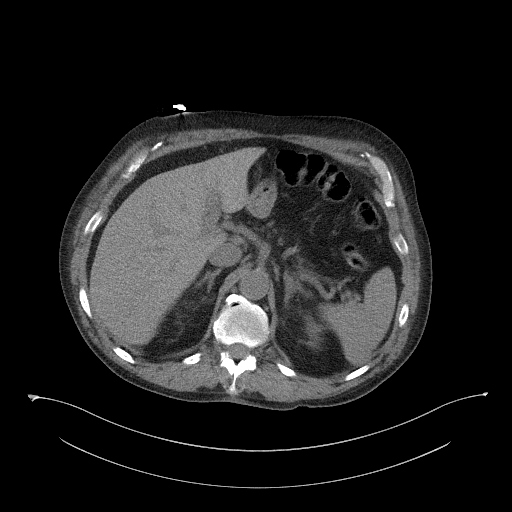
[im 91/104  soft-tissue]
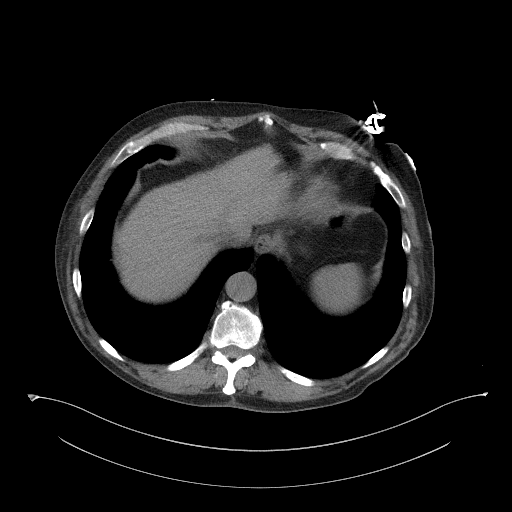
[im 99/104  soft-tissue]
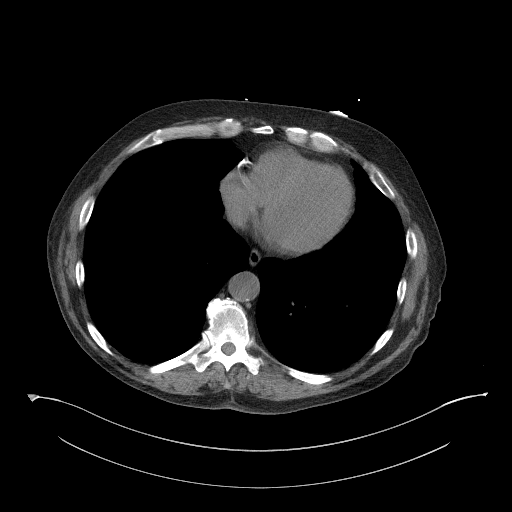

[Series 6: a/p w/o cor · coronal · non-contrast · 0.90mm/px · 3 of 171 slices shown]
[im 57/171  soft-tissue]
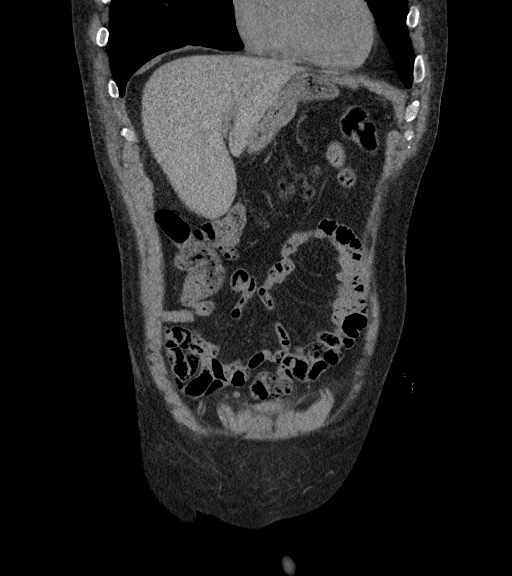
[im 76/171  soft-tissue]
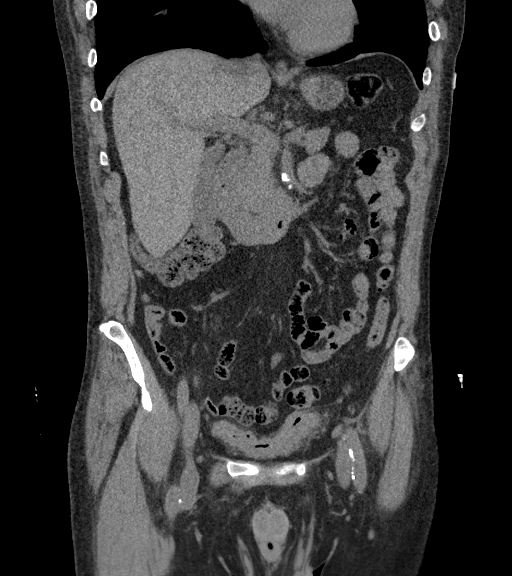
[im 95/171  soft-tissue]
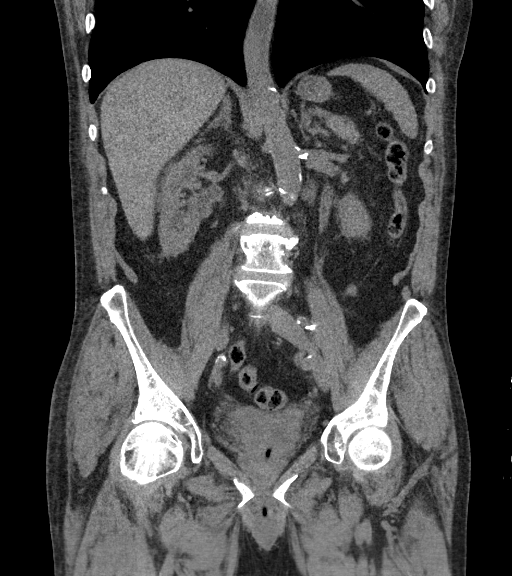

[16 of 46 positions shown; findings below may reference images not displayed]

FINDINGS: Lower chest: Emphysema. Normal heart size without pericardial or
pleural effusion. Multivessel coronary artery atherosclerosis.

Hepatobiliary: Normal noncontrast appearance of the liver. Small
gallstones without acute cholecystitis or biliary duct dilatation.

Pancreas: Normal, without mass or ductal dilatation.

Spleen: Normal in size, without focal abnormality.

Adrenals/Urinary Tract: Normal adrenal glands. Mild renal cortical
thinning bilaterally. Perirenal interstitial thickening. Exophytic
interpolar right renal 3.8 cm lesion measures 48 HU on [DATE]. No
renal calculi. Mild bilateral hydroureteronephrosis. Hydroureter is
followed to the level of the urinary bladder, without obstructive
mass. There is a nonobstructive punctate mid right ureteric stone on
59/3 and sagittal image 85.

Foley catheter within the urinary bladder. The bladder is thick
walled with mild surrounding edema.

Stomach/Bowel: Normal stomach, without wall thickening. Normal
colon, appendix, and terminal ileum. Normal small bowel.

Vascular/Lymphatic: Advanced aortic and branch vessel
atherosclerosis. Mildly prominent abdominal retroperitoneal nodes
are not pathologic by size criteria and likely reactive.

Prominent bilateral inguinal nodes are also identified and favored
to be reactive.

Reproductive: Normal prostate.

Other: No significant free fluid.

Musculoskeletal: Lumbosacral spondylosis. Minimal wedging of the T11
superior endplate.
IMPRESSION: 1. Bilateral mild hydroureteronephrosis, followed to the level of
the urinary bladder. The bladder is thick walled with surrounding
pericystic edema in the setting of a Foley catheter. Considerations
include cystitis and/or bladder outlet obstruction. Perirenal
interstitial thickening is nonspecific but likely related to the
clinical history of renal insufficiency.
2. 3 mm mid right ureteric nonobstructive stone.
3. Right renal mass is technically indeterminate on noncontrast
exam. Most consistent with a cyst on yesterday's ultrasound.
Consider surveillance with renal ultrasound at 6 months and
attention to the right renal lesion.
4. Coronary artery atherosclerosis. Aortic Atherosclerosis
(P4IN1-TYF.F). Emphysema (P4IN1-TQH.Q).
5. Cholelithiasis.

## 2021-04-15 ENCOUNTER — Other Ambulatory Visit: Payer: Self-pay | Admitting: Family Medicine

## 2021-06-26 DIAGNOSIS — D631 Anemia in chronic kidney disease: Secondary | ICD-10-CM | POA: Diagnosis not present

## 2021-06-26 DIAGNOSIS — N185 Chronic kidney disease, stage 5: Secondary | ICD-10-CM | POA: Diagnosis not present

## 2021-06-26 DIAGNOSIS — I120 Hypertensive chronic kidney disease with stage 5 chronic kidney disease or end stage renal disease: Secondary | ICD-10-CM | POA: Diagnosis not present

## 2021-06-26 DIAGNOSIS — N189 Chronic kidney disease, unspecified: Secondary | ICD-10-CM | POA: Diagnosis not present

## 2021-06-26 DIAGNOSIS — N390 Urinary tract infection, site not specified: Secondary | ICD-10-CM | POA: Diagnosis not present

## 2021-06-26 DIAGNOSIS — N2581 Secondary hyperparathyroidism of renal origin: Secondary | ICD-10-CM | POA: Diagnosis not present

## 2021-07-01 NOTE — Progress Notes (Signed)
?  Date of Visit: 07/02/2021  ? ?SUBJECTIVE:  ? ?HPI: ? ?Louis Butler presents today for follow up. He is accompanied by his wife. ? ?Disability paperwork - continues to be unable to work. Feels weak much of the time. Wife has to watch him closely if they are at the store, afraid he will fall. ? ?Diabetes - taking levemir 6u daily. Sugars running low to mid 100s. Nothing lower than 100. Knows how to manage hypoglycemic symptoms. ? ?Hypertension - taking carvedilol '25mg'$  twice daily, bps at home are well managed based on log he brought in today ? ?Neuropathy - taking gabapentin '300mg'$  at night with good relief ? ?ESRD - no longer on HD. Sees Dr. Posey Pronto with nephro every other month. Still undergoing renal transplant eval at [facility]. ? ?Chronic UTI - treated in December by [facility] urology PA with doxycycline twice daily x30 days for presumed prostatitis. Symptoms improved during that time but have since recurred. Patient reports nephrologist referred him to urology in [place], has appointment for that 4/6. Still having dysuria chronically. ? ?Of note, reviewed stress test done at [facility] as part of transplant workup, shows severe coronary artery calcification and EF 47%. Echo earlier in 2022 with normal EF. Stress test was negative for ischemia. Patient denies chest pain or breathing problems. ? ? ?OBJECTIVE:  ? ?BP 115/60   Pulse 60   Wt 201 lb (91.2 kg)   SpO2 100%   BMI 25.81 kg/m?  ?Gen: no acute distress, pleasant cooperative ?HEENT: normocephalic, atraumatic  ?Heart: regular rate and rhythm, no murmur ?Lungs: clear to auscultation bilaterally, normal work of breathing  ?Neuro: alert, speech normal, grossly nonfocal ? ?ASSESSMENT/PLAN:  ? ?Health maintenance:  ?-given rx for shingles vaccine, he is unsure if he wants to get it ? ?Diabetes mellitus ([place]) ?A1c 6.2 (likely unreliable given renal failure) ?No low sugars with levemir ?Continue present dose. Follow up 6 months  ? ?Diabetic neuropathy ([place]) ?Well controlled.  Continue current medication regimen.  ? ?ESRD (end stage renal disease) ([place]) ?Per patient and wife, renal function remains stable when checked by nephrology ?Urodynamic studies had to be canceled at [facility] due to recurrent infection. ?Has upcoming appointment with urology for chronic UTI here locally. ?Will complete disability paperwork and fax in ? ?Hypertension ?Well controlled. Continue current medication regimen.  ? ?Coronary artery calcification ?Severe calcification noted on stress test at [facility] ?Patient reports he is not seeing a cardiologist there, it was just for pretransplant eval ?Will refer to cardiology here to get him established ?Will also suggest he start taking aspirin '81mg'$  daily ? ?Completed handicap placard form ? ?Note - during visit patient made statement that he wanted to punch the people who require him to continually have disability paperwork updated. I asked if he had any serious thoughts of hurting others and he then said something about going and shooting all of those people up (said with a straight face). I inquired if he was serious, that we have to take statements like this seriously. He said he was joking and would not do this. His wife further stated he was joking, and said there are no guns in the home. Encouraged patient to not make joking statements like this. ? ?FOLLOW UP: ?Follow up in 6 months for above issues ?Referring to cardiology ? ?[place]. Ardelia Mems, MD ?[facility] ?

## 2021-07-02 ENCOUNTER — Encounter: Payer: Self-pay | Admitting: Family Medicine

## 2021-07-02 ENCOUNTER — Ambulatory Visit (INDEPENDENT_AMBULATORY_CARE_PROVIDER_SITE_OTHER): Payer: Medicare Other | Admitting: Family Medicine

## 2021-07-02 ENCOUNTER — Other Ambulatory Visit: Payer: Self-pay

## 2021-07-02 VITALS — BP 115/60 | HR 60 | Wt 201.0 lb

## 2021-07-02 DIAGNOSIS — I2510 Atherosclerotic heart disease of native coronary artery without angina pectoris: Secondary | ICD-10-CM | POA: Diagnosis not present

## 2021-07-02 DIAGNOSIS — E1129 Type 2 diabetes mellitus with other diabetic kidney complication: Secondary | ICD-10-CM | POA: Diagnosis not present

## 2021-07-02 DIAGNOSIS — E1142 Type 2 diabetes mellitus with diabetic polyneuropathy: Secondary | ICD-10-CM

## 2021-07-02 DIAGNOSIS — N186 End stage renal disease: Secondary | ICD-10-CM | POA: Diagnosis not present

## 2021-07-02 DIAGNOSIS — I10 Essential (primary) hypertension: Secondary | ICD-10-CM | POA: Diagnosis not present

## 2021-07-02 DIAGNOSIS — I2584 Coronary atherosclerosis due to calcified coronary lesion: Secondary | ICD-10-CM

## 2021-07-02 LAB — POCT GLYCOSYLATED HEMOGLOBIN (HGB A1C): HbA1c, POC (controlled diabetic range): 6.2 % (ref 0.0–7.0)

## 2021-07-02 MED ORDER — SHINGRIX 50 MCG/0.5ML IM SUSR: 0.5000 mL | Freq: Once | INTRAMUSCULAR | 1 refills | Status: AC

## 2021-07-02 NOTE — Assessment & Plan Note (Signed)
Well controlled. Continue current medication regimen.  

## 2021-07-02 NOTE — Assessment & Plan Note (Addendum)
Per patient and wife, renal function remains stable when checked by nephrology ?Urodynamic studies had to be canceled at [facility] due to recurrent infection. ?Has upcoming appointment with urology for chronic UTI here locally. ?Will complete disability paperwork and fax in ?

## 2021-07-02 NOTE — Assessment & Plan Note (Signed)
A1c 6.2 (likely unreliable given renal failure) ?No low sugars with levemir ?Continue present dose. Follow up 6 months  ?

## 2021-07-02 NOTE — Patient Instructions (Signed)
It was great to see you again today! ? ?Will fill out and send in disability paperwork ? ?Stay on current medications ? ?Keep appointment with urologist ? ?If you decide to get shingles vaccine, take prescription to your pharmacy ? ?Referring to cardiologist here in [place] ? ?Follow up with me in 6 months  ? ?Be well, ?Dr. Ardelia Mems  ?

## 2021-07-02 NOTE — Assessment & Plan Note (Signed)
Severe calcification noted on stress test at [facility] ?Patient reports he is not seeing a cardiologist there, it was just for pretransplant eval ?Will refer to cardiology here to get him established ?Will also suggest he start taking aspirin '81mg'$  daily ?

## 2021-07-11 ENCOUNTER — Ambulatory Visit: Payer: Self-pay | Admitting: Physician Assistant

## 2021-07-31 ENCOUNTER — Other Ambulatory Visit: Payer: Self-pay | Admitting: Gastroenterology

## 2021-07-31 ENCOUNTER — Other Ambulatory Visit: Payer: Self-pay | Admitting: Family Medicine

## 2021-08-03 NOTE — Progress Notes (Deleted)
Cardiology Office Note:    Date:  08/03/2021   ID:  Louis Butler, DOB [birth year], MRN [id number]  PCP:  Leeanne Rio, MD   [facility] Providers Cardiologist:  None {     Referring MD: Leeanne Rio, MD    History of Present Illness:    Louis Butler is a 66 y.o. male with a hx of coronary artery calcification, HTN, DMII, ESRD undergoing renal transplant work-up at [facility] , and tobacco abuse who was referred by Dr. Ardelia Mems for further evaluation of coronary calcium.  Underwent myocardial perfusion PET at [facility] 01/2021 showed no evidence of ischemia or infarction. Had severe coronary calcification. No microvascular disease. TTE 09/2020 with normal LVEF, no significant valve disease.   Past Medical History:  Diagnosis Date   Arthritis    Cellulitis 03/14/2019   feet   Diabetes mellitus without complication ([place])    Type II   Dialysis patient ([place])    tue,thur,sat   End-stage kidney disease ([place]) 03/14/2019   TTHSat [address]   GERD (gastroesophageal reflux disease)    Heart murmur    History of blood transfusion    Hypertension    12/30/19- having low blood pressure since beginning   Irregular heart beat    UTI (urinary tract infection)     Past Surgical History:  Procedure Laterality Date   arm surgery Right    was cut to the bone   BASCILIC VEIN TRANSPOSITION Left 11/18/2019   Procedure: FIRST STAGE LEFT ARM [place];  Surgeon: Serafina Mitchell, MD;  Location: [facility];  Service: Vascular;  Laterality: Left;   [place] Left 01/04/2020   Procedure: LEFT SECOND STAGE [place];  Surgeon: Serafina Mitchell, MD;  Location: [place];  Service: Vascular;  Laterality: Left;   COLONOSCOPY  06/2019   EYE SURGERY Bilateral    cataract   TRANSURETHRAL RESECTION OF PROSTATE N/A 08/08/2019   Procedure: TRANSURETHRAL RESECTION OF THE PROSTATE (TURP);  Surgeon: Lucas Mallow, MD;  Location: [facility];  Service:  Urology;  Laterality: N/A;   UPPER GASTROINTESTINAL ENDOSCOPY  06/2019   WISDOM TOOTH EXTRACTION      Current Medications: No outpatient medications have been marked as taking for the 08/06/21 encounter (Appointment) with Freada Bergeron, MD.     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  Tobacco Use   Smoking status: Former    Packs/day: 1.00    Types: Cigarettes    Quit date: 03/08/2019    Years since quitting: 2.4   Smokeless tobacco: Never  Vaping Use   Vaping Use: Never used  Substance and Sexual Activity   Alcohol use: Not Currently    Comment: stopped drinking alcohol - November 2020   Drug use: No   Sexual activity: Not on file  Other Topics Concern   Not on file  Social History Narrative   Not on file   Social Determinants of Health   Financial Resource Strain: Not on file  Food Insecurity: Not on file  Transportation Needs: Not on file  Physical Activity: Not on file  Stress: Not on file  Social Connections: Not on file  Family History: The patient's ***family history includes Diabetes type II in his brother, father, mother, and sister; Hypertension in his brother and mother; Lung cancer in his mother; Other in his son. There is no history of CAD, Colon cancer, Esophageal cancer, Inflammatory bowel disease, Liver disease, Pancreatic cancer, Rectal cancer, or Stomach cancer.  ROS:   Please see the history of present illness.    *** All other systems reviewed and are negative.  EKGs/Labs/Other Studies Reviewed:    The following studies were reviewed today: TTE 2020/10/04: INTERPRETATION ---------------------------------------------------------------    NORMAL LEFT  VENTRICULAR SYSTOLIC FUNCTION    NORMAL RIGHT VENTRICULAR SYSTOLIC FUNCTION    VALVULAR REGURGITATION: TRIVIAL AR, TRIVIAL MR, TRIVIAL PR, TRIVIAL TR    NO VALVULAR STENOSIS    LVEF LOWER LIMITS OF NORMAL    INSUFFICIENT TR TO ESTIMATE RVSP    NO PRIOR STUDY FOR COMPARISON    3D acquisition and reconstructions were performed as part of this    examination to more accurately quantify the effects of identified    structural abnormalities as part of the exam. (post-processing on an    Independent workstation).   Cardiac OET 01/2021: Cardiac PET/CT Myocardial Perfusion Imaging Study Demonstrate No Evidence of Ischemia or Infarct By Visual Analysis..    1. Severe coronary calcifications.   2. No evidence of focal epicardial or microvascular disease.   3. Mildly reduced LVEF with abnormal left ventricular volumes. Increased RV uptake.   EKG:  EKG is *** ordered today.  The ekg ordered today demonstrates ***  Recent Labs: No results found for requested labs within last 8760 hours.  Recent Lipid Panel No results found for: CHOL, TRIG, HDL, CHOLHDL, VLDL, LDLCALC, LDLDIRECT   Risk Assessment/Calculations:   {Does this patient have ATRIAL FIBRILLATION?:[phone]}       Physical Exam:    VS:  There were no vitals taken for this visit.    Wt Readings from Last 3 Encounters:  07/02/21 201 lb (91.2 kg)  01/17/21 200 lb (90.7 kg)  11/01/20 196 lb 6.4 oz (89.1 kg)     GEN: *** Well nourished, well developed in no acute distress HEENT: Normal NECK: No JVD; No carotid bruits LYMPHATICS: No lymphadenopathy CARDIAC: ***RRR, no murmurs, rubs, gallops RESPIRATORY:  Clear to auscultation without rales, wheezing or rhonchi  ABDOMEN: Soft, non-tender, non-distended MUSCULOSKELETAL:  No edema; No deformity  SKIN: Warm and dry NEUROLOGIC:  Alert and oriented x 3 PSYCHIATRIC:  Normal affect   ASSESSMENT:    No diagnosis found. PLAN:    In order of problems listed above:  #Coronary  Artery Calcification: Myocardial PET scan without evidence of ischemia.  -Continue coreg '25mg'$  BID -Start ASA '81mg'$  daily -Start crestor '20mg'$  daily  #ESRD: No longer on HD. Undergoing work-up for renal transplant at [facility]. -Follow-up with nephrology as scheduled  #DMII: -Continue insulin  #HTN: -Continue coreg '25mg'$  BID      {Are you ordering a CV Procedure (e.g. stress test, cath, DCCV, TEE, etc)?   Press F2        :[id number]}    Medication Adjustments/Labs and Tests Ordered: Current medicines are reviewed at length with the patient today.  Concerns regarding medicines are outlined above.  No orders of the defined types were placed in this encounter.  No orders of the defined types were placed in this encounter.   There are no Patient Instructions on file for this visit.   Signed, Freada Bergeron, MD  08/03/2021 3:01 PM  [facility]

## 2021-08-06 ENCOUNTER — Ambulatory Visit: Payer: Medicare Other | Admitting: Cardiology

## 2021-08-16 DIAGNOSIS — N185 Chronic kidney disease, stage 5: Secondary | ICD-10-CM | POA: Diagnosis not present

## 2021-08-22 ENCOUNTER — Other Ambulatory Visit: Payer: Self-pay | Admitting: Gastroenterology

## 2021-08-26 ENCOUNTER — Other Ambulatory Visit: Payer: Self-pay | Admitting: Gastroenterology

## 2021-09-10 ENCOUNTER — Encounter: Payer: Self-pay | Admitting: *Deleted

## 2021-10-04 DIAGNOSIS — I120 Hypertensive chronic kidney disease with stage 5 chronic kidney disease or end stage renal disease: Secondary | ICD-10-CM | POA: Diagnosis not present

## 2021-10-04 DIAGNOSIS — D631 Anemia in chronic kidney disease: Secondary | ICD-10-CM | POA: Diagnosis not present

## 2021-10-04 DIAGNOSIS — N2581 Secondary hyperparathyroidism of renal origin: Secondary | ICD-10-CM | POA: Diagnosis not present

## 2021-10-04 DIAGNOSIS — E872 Acidosis, unspecified: Secondary | ICD-10-CM | POA: Diagnosis not present

## 2021-10-04 DIAGNOSIS — N185 Chronic kidney disease, stage 5: Secondary | ICD-10-CM | POA: Diagnosis not present

## 2021-10-04 DIAGNOSIS — E875 Hyperkalemia: Secondary | ICD-10-CM | POA: Diagnosis not present

## 2021-10-04 DIAGNOSIS — N390 Urinary tract infection, site not specified: Secondary | ICD-10-CM | POA: Diagnosis not present

## 2021-10-10 ENCOUNTER — Other Ambulatory Visit: Payer: Self-pay

## 2021-10-17 ENCOUNTER — Ambulatory Visit (INDEPENDENT_AMBULATORY_CARE_PROVIDER_SITE_OTHER): Payer: Medicare Other | Admitting: Family Medicine

## 2021-10-17 VITALS — BP 137/77 | HR 70 | Wt 195.8 lb

## 2021-10-17 DIAGNOSIS — E1129 Type 2 diabetes mellitus with other diabetic kidney complication: Secondary | ICD-10-CM | POA: Diagnosis not present

## 2021-10-17 DIAGNOSIS — I10 Essential (primary) hypertension: Secondary | ICD-10-CM | POA: Diagnosis not present

## 2021-10-17 LAB — POCT GLYCOSYLATED HEMOGLOBIN (HGB A1C): HbA1c, POC (controlled diabetic range): 6.4 % (ref 0.0–7.0)

## 2021-10-17 MED ORDER — AMLODIPINE BESYLATE 2.5 MG PO TABS: 2.5000 mg | ORAL_TABLET | Freq: Every day | ORAL | Status: DC

## 2021-10-17 MED ORDER — SHINGRIX 50 MCG/0.5ML IM SUSR: INTRAMUSCULAR | 1 refills | Status: DC

## 2021-10-17 NOTE — Progress Notes (Signed)
  Date of Visit: 10/17/2021   SUBJECTIVE:   HPI:  Louis Butler presents today for routine follow up.  Diabetes - currently taking levemir 6u daily. Checks sugars regularly and almost all between 100-150. No low sugars.   Hypertension - currently taking no medications for blood pressure. Bps at home running high some. Not on HD anymore, though following regularly with nephrology.  He has gotten social security disability. Does not need any additional disability paperwork done from me.  OBJECTIVE:   BP 137/77   Pulse 70   Wt 195 lb 12.8 oz (88.8 kg)   SpO2 100%   BMI 25.14 kg/m  Gen: no acute distress pleasant cooperative HEENT: normocephalic, atraumatic  Heart: regular rate and rhythm, no murmur Lungs: clear to auscultation bilaterally normal work of breathing  Neuro: alert speech normal grossly nonfocal Ext: No appreciable lower extremity edema bilaterally   ASSESSMENT/PLAN:   Health maintenance:  -given shingles vaccine to take to pharmacy  Hypertension Bps high at home Above goal here, ideally <130/80 given CKD and desire to prevent progression back to needing HD Start amlodipine 2.'5mg'$  daily - patient believes he has a supply of this at home  Diabetes Doing well. Advised can back off on checking sugars, can just do fasting value daily if he'd like  Follow up in 3 months   [place] J. Ardelia Mems, [place]

## 2021-10-17 NOTE — Patient Instructions (Addendum)
It was great to see you again today!  Start amlodipine 2.'5mg'$  daily. Let me know if you need a prescription for it.  Take shingles shot prescription to your pharmacy  Follow up with me in 3 months, sooner if needed  Be well, Dr. Ardelia Mems

## 2021-10-31 DIAGNOSIS — R350 Frequency of micturition: Secondary | ICD-10-CM | POA: Diagnosis not present

## 2021-10-31 DIAGNOSIS — R3914 Feeling of incomplete bladder emptying: Secondary | ICD-10-CM | POA: Diagnosis not present

## 2021-10-31 DIAGNOSIS — N390 Urinary tract infection, site not specified: Secondary | ICD-10-CM | POA: Diagnosis not present

## 2021-10-31 DIAGNOSIS — N130 Hydronephrosis with ureteropelvic junction obstruction: Secondary | ICD-10-CM | POA: Diagnosis not present

## 2021-11-13 DIAGNOSIS — N1339 Other hydronephrosis: Secondary | ICD-10-CM | POA: Diagnosis not present

## 2021-11-13 DIAGNOSIS — K5730 Diverticulosis of large intestine without perforation or abscess without bleeding: Secondary | ICD-10-CM | POA: Diagnosis not present

## 2021-11-13 DIAGNOSIS — N3289 Other specified disorders of bladder: Secondary | ICD-10-CM | POA: Diagnosis not present

## 2021-11-13 DIAGNOSIS — N1330 Unspecified hydronephrosis: Secondary | ICD-10-CM | POA: Diagnosis not present

## 2021-11-13 DIAGNOSIS — K8020 Calculus of gallbladder without cholecystitis without obstruction: Secondary | ICD-10-CM | POA: Diagnosis not present

## 2021-11-19 DIAGNOSIS — H52201 Unspecified astigmatism, right eye: Secondary | ICD-10-CM | POA: Diagnosis not present

## 2021-11-19 DIAGNOSIS — H524 Presbyopia: Secondary | ICD-10-CM | POA: Diagnosis not present

## 2021-11-19 DIAGNOSIS — Z961 Presence of intraocular lens: Secondary | ICD-10-CM | POA: Diagnosis not present

## 2021-11-19 DIAGNOSIS — Z794 Long term (current) use of insulin: Secondary | ICD-10-CM | POA: Diagnosis not present

## 2021-11-19 DIAGNOSIS — H5201 Hypermetropia, right eye: Secondary | ICD-10-CM | POA: Diagnosis not present

## 2021-11-19 DIAGNOSIS — E119 Type 2 diabetes mellitus without complications: Secondary | ICD-10-CM | POA: Diagnosis not present

## 2021-11-22 ENCOUNTER — Encounter: Payer: Self-pay | Admitting: Family Medicine

## 2021-11-22 LAB — HM DIABETES EYE EXAM

## 2021-12-12 DIAGNOSIS — N2581 Secondary hyperparathyroidism of renal origin: Secondary | ICD-10-CM | POA: Diagnosis not present

## 2021-12-12 DIAGNOSIS — D631 Anemia in chronic kidney disease: Secondary | ICD-10-CM | POA: Diagnosis not present

## 2021-12-12 DIAGNOSIS — I120 Hypertensive chronic kidney disease with stage 5 chronic kidney disease or end stage renal disease: Secondary | ICD-10-CM | POA: Diagnosis not present

## 2021-12-12 DIAGNOSIS — N189 Chronic kidney disease, unspecified: Secondary | ICD-10-CM | POA: Diagnosis not present

## 2021-12-12 DIAGNOSIS — N185 Chronic kidney disease, stage 5: Secondary | ICD-10-CM | POA: Diagnosis not present

## 2021-12-23 ENCOUNTER — Encounter: Payer: Self-pay | Admitting: Gastroenterology

## 2022-01-08 DIAGNOSIS — N3000 Acute cystitis without hematuria: Secondary | ICD-10-CM | POA: Diagnosis not present

## 2022-01-08 DIAGNOSIS — R350 Frequency of micturition: Secondary | ICD-10-CM | POA: Diagnosis not present

## 2022-01-08 DIAGNOSIS — R3914 Feeling of incomplete bladder emptying: Secondary | ICD-10-CM | POA: Diagnosis not present

## 2022-01-22 ENCOUNTER — Telehealth: Payer: Self-pay | Admitting: *Deleted

## 2022-01-22 NOTE — Chronic Care Management (AMB) (Signed)
  Care Coordination  Outreach Note  01/22/2022 Name: LORIN GAWRON MRN: [id number] DOB: 1955-07-01   Care Coordination Outreach Attempts: An unsuccessful telephone outreach was attempted today to offer the patient information about available care coordination services as a benefit of their health plan.   Follow Up Plan:  Additional outreach attempts will be made to offer the patient care coordination information and services.   Encounter Outcome:  No Answer  [place]  Direct Dial: [phone]

## 2022-01-29 NOTE — Chronic Care Management (AMB) (Signed)
  Care Coordination  Outreach Note  01/29/2022 Name: MIKAEL SKODA MRN: [id number] DOB: [birth year]   Care Coordination Outreach Attempts: A second unsuccessful outreach was attempted today to offer the patient with information about available care coordination services as a benefit of their health plan.     Follow Up Plan:  Additional outreach attempts will be made to offer the patient care coordination information and services.   Encounter Outcome:  No Answer  [place]  Direct Dial: [phone]

## 2022-01-30 ENCOUNTER — Ambulatory Visit (INDEPENDENT_AMBULATORY_CARE_PROVIDER_SITE_OTHER): Payer: Medicare Other | Admitting: Family Medicine

## 2022-01-30 ENCOUNTER — Encounter: Payer: Self-pay | Admitting: Family Medicine

## 2022-01-30 VITALS — BP 135/77 | HR 63 | Temp 98.1°F | Ht 75.0 in | Wt 199.0 lb

## 2022-01-30 DIAGNOSIS — E1129 Type 2 diabetes mellitus with other diabetic kidney complication: Secondary | ICD-10-CM

## 2022-01-30 DIAGNOSIS — Z23 Encounter for immunization: Secondary | ICD-10-CM | POA: Diagnosis not present

## 2022-01-30 DIAGNOSIS — Z122 Encounter for screening for malignant neoplasm of respiratory organs: Secondary | ICD-10-CM | POA: Diagnosis not present

## 2022-01-30 DIAGNOSIS — N186 End stage renal disease: Secondary | ICD-10-CM | POA: Diagnosis not present

## 2022-01-30 LAB — POCT GLYCOSYLATED HEMOGLOBIN (HGB A1C): HbA1c, POC (controlled diabetic range): 6.3 % (ref 0.0–7.0)

## 2022-01-30 NOTE — Progress Notes (Unsigned)
    SUBJECTIVE:   CHIEF COMPLAINT / HPI:   Louis Butler is a 66 y.o. male with a past medical history of T2DM, HTN, ESRD not on dialysis, pancreatic insufficiency, and tobacco use disorder presenting to the clinic for a regular check-up visit.  Diabetes mellitus with kidney complications Patient takes 6 units of Levemir daily at breakfast and measures his blood glucose periodically.  His lowest readings occur upon awakening and stay above 110.  Patient reports that he has some neuropathy of his feet and manifested in the form of sharp pains in his toes that come and go throughout the day randomly.  He does not note any urinary frequency.  Patient is currently managing a chronic UTI since September with a low-dose antibiotic (does not member name) prescribed by [facility].  He has a follow-up appointment with [facility] on November 22nd.  Healthcare maintenance Patient is interested in a flu shot today.  Decided not to get Shingles vaccine because he does not feel it is necessary at this time. Planning to schedule a colonoscopy soon. Has never had a low-dose CT scan of his lungs.   PERTINENT  PMH / PSH: The patient has a 25 pack year smoking history over 50 years at about 0.5 packs per day.  He mostly quit smoking in 2020, but reports still "occasionally smoking a cigarette." He does not currently drink. His 73th wedding anniversary is coming up in November.   OBJECTIVE:   BP 135/77   Pulse 63   Temp 98.1 F (36.7 C)   Ht '6\' 3"'$  (1.905 m)   Wt 199 lb (90.3 kg)   SpO2 100%   BMI 24.87 kg/m   General: Age-appropriate, resting in chair, alert and at baseline. Cardiovascular: Regular rate and rhythm. Normal S1/S2. No murmurs, rubs, or gallops appreciated. 2+ radial pulses bilaterally. Pulmonary: Clear bilaterally to ascultation. No increased WOB, no accessory muscle usage. No wheezes, rales, or crackles. Skin: Warm and dry.  No rashes. Extremities: No peripheral edema  bilaterally. No cyanosis or clubbing.   Point of Care Labs  Reference Range 01/30/22 09:39  HbA1c, POC (controlled diabetic range) 0.0 - 7.0 % 6.3    ASSESSMENT/PLAN:   Diabetes mellitus (HCC) Patient's A1c of 6.3 measured today shows that diabetes is well controlled on current regimen of 6 units Levemir daily injection without any hypoglycemic spells on daily blood glucose checks.  Patient reports mild neuropathy and no urinary frequency. - Obtained BMP to evaluate kidney function, f/u results  Screening for lung cancer Given the patient's 25-pack-year history, he meets criteria for low-dose CT scan of his lungs and he agrees to this test at this time. - Ordered low dose CT chest w/o contrast  Healthcare maintenance - Flu vaccine administered today - Patient will go to pharmacy to get COVID-19 vaccine booster - Obtained screening lipid panel, f/u results   [name] Mining engineer, [place]

## 2022-01-30 NOTE — Assessment & Plan Note (Addendum)
Patient's A1c of 6.3 measured today shows that diabetes is well controlled on current regimen of 6 units Levemir daily injection without any hypoglycemic spells on daily blood glucose checks.  Patient reports mild neuropathy and no urinary frequency. - Obtained BMP to evaluate kidney function, f/u results

## 2022-01-30 NOTE — Patient Instructions (Signed)
It was great to see you again today!  Get COVID vaccine at pharmacy  Cholesterol and kidney function today  Ordered CT lungs for cancer screening  Be well, Dr. Ardelia Mems

## 2022-01-30 NOTE — Progress Notes (Incomplete)
    SUBJECTIVE:   CHIEF COMPLAINT / HPI:   Louis Butler is a 66 y.o. male with a past medical history of T2DM, HTN, ESRD not on dialysis, pancreatic insufficiency, and tobacco use disorder presenting to the clinic for a regular check-up visit.  Diabetes mellitus with kidney complications Patient takes 6 units of Levemir daily at breakfast and measures his blood glucose periodically.  His lowest readings occur upon awakening and stay above 110.  Patient reports that he has some neuropathy of his feet and manifested in the form of sharp pains in his toes that come and go throughout the day randomly.  He does not note any urinary frequency.  Patient is currently managing a chronic UTI since September with a low-dose antibiotic (does not member name) prescribed by [facility].  He has a follow-up appointment with [facility] on November 22nd.  Healthcare maintenance Patient is interested in a flu shot today.  Decided not to get Shingles vaccine because he does not feel it is necessary at this time. Planning to schedule a colonoscopy soon. Has never had a low-dose CT scan of his lungs.   PERTINENT  PMH / PSH: The patient has a 25 pack year smoking history over 50 years at about 0.5 packs per day.  He mostly quit smoking in 2020, but reports still "occasionally smoking a cigarette." He does not currently drink.   OBJECTIVE:   BP 135/77   Pulse 63   Temp 98.1 F (36.7 C)   Ht '6\' 3"'$  (1.905 m)   Wt 199 lb (90.3 kg)   SpO2 100%   BMI 24.87 kg/m   General: Age-appropriate, resting comfortably in chair, NAD, WNWD, alert and at baseline. Cardiovascular: Regular rate and rhythm. Normal S1/S2. No murmurs, rubs, or gallops appreciated. 2+ radial pulses. Pulmonary: Clear bilaterally to ascultation. No increased WOB, no accessory muscle usage. No wheezes, rales, or crackles. Skin: Warm and dry. Extremities: No peripheral edema bilaterally. No cyanosis or clubbing.   Labs  Reference Range  01/30/22 09:39  HbA1c, POC (controlled diabetic range) 0.0 - 7.0 % 6.3    ASSESSMENT/PLAN:   No problem-specific Assessment & Plan notes found for this encounter.   Healthcare maintenance - Flu vaccine administered today - Obtained lipid panel to evaluate patient's cholesterol   Nichollas Perusse Mining engineer, [place]

## 2022-01-31 ENCOUNTER — Telehealth: Payer: Self-pay

## 2022-01-31 DIAGNOSIS — Z122 Encounter for screening for malignant neoplasm of respiratory organs: Secondary | ICD-10-CM | POA: Insufficient documentation

## 2022-01-31 LAB — LIPID PANEL
Chol/HDL Ratio: 4.4 ratio (ref 0.0–5.0)
Cholesterol, Total: 124 mg/dL (ref 100–199)
HDL: 28 mg/dL — ABNORMAL LOW (ref >39)
LDL Chol Calc (NIH): 85 mg/dL (ref 0–99)
Triglycerides: 50 mg/dL (ref 0–149)
VLDL Cholesterol Cal: 11 mg/dL (ref 5–40)

## 2022-01-31 LAB — BASIC METABOLIC PANEL
BUN/Creatinine Ratio: 9 — ABNORMAL LOW (ref 10–24)
BUN: 40 mg/dL — ABNORMAL HIGH (ref 8–27)
CO2: 16 mmol/L — ABNORMAL LOW (ref 20–29)
Calcium: 8.8 mg/dL (ref 8.6–10.2)
Chloride: 100 mmol/L (ref 96–106)
Creatinine, Ser: 4.57 mg/dL — ABNORMAL HIGH (ref 0.76–1.27)
Glucose: 106 mg/dL — ABNORMAL HIGH (ref 70–99)
Potassium: 5.3 mmol/L — ABNORMAL HIGH (ref 3.5–5.2)
Sodium: 134 mmol/L (ref 134–144)
eGFR: 13 mL/min/{1.73_m2} — ABNORMAL LOW (ref >59)

## 2022-01-31 NOTE — Assessment & Plan Note (Signed)
Given the patient's 25-pack-year history, he meets criteria for low-dose CT scan of his lungs and he agrees to this test at this time. - Ordered low dose CT chest w/o contrast

## 2022-01-31 NOTE — Chronic Care Management (AMB) (Signed)
  Care Coordination   Note   01/31/2022 Name: Louis Butler MRN: [id number] DOB: April 19, 1955  Louis Butler is a 66 y.o. year old male who sees Ardelia Mems, Delorse Limber, MD for primary care. I reached out to Louis Butler by phone today to offer care coordination services.  [name] was given information about Care Coordination services today including:   The Care Coordination services include support from the care team which includes your Nurse Coordinator, Clinical Social Worker, or Pharmacist.  The Care Coordination team is here to help remove barriers to the health concerns and goals most important to you. Care Coordination services are voluntary, and the patient may decline or stop services at any time by request to their care team member.   Care Coordination Consent Status: Patient agreed to services and verbal consent obtained.   Follow up plan:  Telephone appointment with care coordination team member scheduled for:  02/12/22  Encounter Outcome:  Pt. Scheduled  [place]  Direct Dial: [phone]

## 2022-01-31 NOTE — Telephone Encounter (Signed)
Patient made aware of appointment.     Louis Butler CT Chest cancer screening 02/04/2022 1500 arrival 15 min early Entrance C  .Ozella Almond, CMA

## 2022-02-01 NOTE — Assessment & Plan Note (Signed)
Continues to follow up with nephrology regularly. Obtained BMET today as we have no recent measure of renal function in our system.

## 2022-02-04 ENCOUNTER — Ambulatory Visit (HOSPITAL_COMMUNITY)
Admission: RE | Admit: 2022-02-04 | Discharge: 2022-02-04 | Disposition: A | Discharge status: Home / Self Care | Payer: Medicare Other | Source: Ambulatory Visit | Attending: Family Medicine | Admitting: Family Medicine

## 2022-02-04 DIAGNOSIS — J439 Emphysema, unspecified: Secondary | ICD-10-CM | POA: Diagnosis not present

## 2022-02-04 DIAGNOSIS — I700 Atherosclerosis of aorta: Secondary | ICD-10-CM | POA: Insufficient documentation

## 2022-02-04 DIAGNOSIS — Z122 Encounter for screening for malignant neoplasm of respiratory organs: Secondary | ICD-10-CM | POA: Diagnosis not present

## 2022-02-04 DIAGNOSIS — N62 Hypertrophy of breast: Secondary | ICD-10-CM | POA: Insufficient documentation

## 2022-02-04 DIAGNOSIS — Z87891 Personal history of nicotine dependence: Secondary | ICD-10-CM | POA: Diagnosis not present

## 2022-02-05 ENCOUNTER — Telehealth: Payer: Self-pay | Admitting: Family Medicine

## 2022-02-05 DIAGNOSIS — N186 End stage renal disease: Secondary | ICD-10-CM

## 2022-02-05 NOTE — Telephone Encounter (Signed)
Called patient to discuss lab results.  Lipids look good, potassium is elevated at 5.3.  He is supposed to be taking potassium lowering agent prescribed by his nephrologist.  His wife thinks he is taking this medicine.  Recommend we recheck this value next week.  Schedule appointment to come and get labs drawn on Monday, November 6.  His wife will let him know.  DPR on file.  Leeanne Rio, MD

## 2022-02-10 ENCOUNTER — Other Ambulatory Visit: Payer: Medicare Other

## 2022-02-10 DIAGNOSIS — N186 End stage renal disease: Secondary | ICD-10-CM

## 2022-02-11 LAB — BASIC METABOLIC PANEL
BUN/Creatinine Ratio: 10 (ref 10–24)
BUN: 41 mg/dL — ABNORMAL HIGH (ref 8–27)
CO2: 18 mmol/L — ABNORMAL LOW (ref 20–29)
Calcium: 8.4 mg/dL — ABNORMAL LOW (ref 8.6–10.2)
Chloride: 102 mmol/L (ref 96–106)
Creatinine, Ser: 4.22 mg/dL — ABNORMAL HIGH (ref 0.76–1.27)
Glucose: 144 mg/dL — ABNORMAL HIGH (ref 70–99)
Potassium: 5.1 mmol/L (ref 3.5–5.2)
Sodium: 133 mmol/L — ABNORMAL LOW (ref 134–144)
eGFR: 15 mL/min/{1.73_m2} — ABNORMAL LOW (ref >59)

## 2022-02-12 ENCOUNTER — Ambulatory Visit: Payer: Self-pay

## 2022-02-12 ENCOUNTER — Other Ambulatory Visit: Payer: Self-pay | Admitting: Family Medicine

## 2022-02-12 NOTE — Patient Instructions (Signed)
Visit Information  Thank you for taking time to visit with me today. Please don't hesitate to contact me if I can be of assistance to you.   Following are the goals we discussed today:   Goals Addressed             This Visit's Progress    COMPLETED: Patient Stated        Care Coordination Interventions: Active listening / Reflection utilized  Emotional Support Provided Problem [place] strategies reviewed Discussed/.Educated Care Coordination Program 2.   Discussed/.Educated Annual Wellness Visit 3.   Discussed/.Educated Social Determinates of Health 4.   Please inform PCP if services needed in the future         .   If you are experiencing a Mental Health or [place] or need someone to talk to, please call 1-800-273-TALK (toll free, 24 hour hotline)  Patient verbalizes understanding of instructions and care plan provided today and agrees to view in [place]. Active MyChart status and patient understanding of how to access instructions and care plan via MyChart confirmed with patient.     Lazaro Arms RN, BSN, [facility] [facility]   Phone: [phone]

## 2022-02-12 NOTE — Patient Outreach (Signed)
  Care Coordination   Initial Visit Note   02/12/2022 Name: Louis Butler MRN: [id number] DOB: 09/11/1955  Louis Butler is a 66 y.o. year old male who sees Louis Butler, Louis Limber, MD for primary care. I spoke with  Louis Butler by phone today.  What matters to the patients health and wellness today?  The patient has no questions or concerns abouthis health.  He follows up with his PCP with any problems.    Goals Addressed             This Visit's Progress    COMPLETED: Patient Stated        Care Coordination Interventions: Active listening / Reflection utilized  Emotional Support Provided Problem Louis Butler strategies reviewed Discussed/.Educated Care Coordination Program 2.   Discussed/.Educated Annual Wellness Visit 3.   Discussed/.Educated Social Determinates of Health 4.   Please inform PCP if services needed in the future          SDOH assessments and interventions completed:  Yes  SDOH Interventions Today    Flowsheet Row Most Recent Value  SDOH Interventions   Food Insecurity Interventions Intervention Not Indicated  Transportation Interventions Intervention Not Indicated        Care Coordination Interventions Activated:  Yes  Care Coordination Interventions:  Yes, provided   Follow up plan: No further intervention required.   Encounter Outcome:  Pt. Visit Completed   Louis Arms RN, BSN, [facility]   Phone: [phone]

## 2022-02-14 ENCOUNTER — Other Ambulatory Visit: Payer: Self-pay

## 2022-02-26 MED ORDER — TRAMADOL HCL 50 MG PO TABS: 50.0000 mg | ORAL_TABLET | Freq: Four times a day (QID) | ORAL | 0 refills | Status: DC | PRN

## 2022-02-26 NOTE — Telephone Encounter (Signed)
Discussed with patient's wife, he takes this just occasionally for pain.  Last had this filled 2 years ago for only 20 pills and it has lasted him this long.  Cannot take NSAIDs due to his CKD.  I will refill this.  Leeanne Rio, MD

## 2022-03-06 DIAGNOSIS — N185 Chronic kidney disease, stage 5: Secondary | ICD-10-CM | POA: Diagnosis not present

## 2022-03-14 DIAGNOSIS — N185 Chronic kidney disease, stage 5: Secondary | ICD-10-CM | POA: Diagnosis not present

## 2022-03-14 DIAGNOSIS — I120 Hypertensive chronic kidney disease with stage 5 chronic kidney disease or end stage renal disease: Secondary | ICD-10-CM | POA: Diagnosis not present

## 2022-03-14 DIAGNOSIS — D631 Anemia in chronic kidney disease: Secondary | ICD-10-CM | POA: Diagnosis not present

## 2022-03-14 DIAGNOSIS — N2581 Secondary hyperparathyroidism of renal origin: Secondary | ICD-10-CM | POA: Diagnosis not present

## 2022-03-28 DIAGNOSIS — I871 Compression of vein: Secondary | ICD-10-CM | POA: Diagnosis not present

## 2022-03-28 DIAGNOSIS — N185 Chronic kidney disease, stage 5: Secondary | ICD-10-CM | POA: Diagnosis not present

## 2022-05-01 ENCOUNTER — Other Ambulatory Visit: Payer: Self-pay

## 2022-05-01 ENCOUNTER — Ambulatory Visit (INDEPENDENT_AMBULATORY_CARE_PROVIDER_SITE_OTHER): Payer: Medicare Other | Admitting: Family Medicine

## 2022-05-01 ENCOUNTER — Encounter: Payer: Self-pay | Admitting: Family Medicine

## 2022-05-01 VITALS — BP 131/64 | HR 72 | Ht 75.0 in | Wt 203.8 lb

## 2022-05-01 DIAGNOSIS — Z23 Encounter for immunization: Secondary | ICD-10-CM

## 2022-05-01 DIAGNOSIS — I10 Essential (primary) hypertension: Secondary | ICD-10-CM | POA: Diagnosis not present

## 2022-05-01 DIAGNOSIS — E1129 Type 2 diabetes mellitus with other diabetic kidney complication: Secondary | ICD-10-CM

## 2022-05-01 DIAGNOSIS — N390 Urinary tract infection, site not specified: Secondary | ICD-10-CM | POA: Diagnosis not present

## 2022-05-01 DIAGNOSIS — Z720 Tobacco use: Secondary | ICD-10-CM | POA: Diagnosis not present

## 2022-05-01 LAB — POCT GLYCOSYLATED HEMOGLOBIN (HGB A1C): HbA1c, POC (controlled diabetic range): 6.6 % (ref 0.0–7.0)

## 2022-05-01 NOTE — Progress Notes (Signed)
  Date of Visit: 05/01/2022   SUBJECTIVE:   HPI:  Louis Butler presents today for routine follow-up.  Diabetes: Currently taking Levemir 6 units daily.  Tolerating this well.  Denies any low sugars.  His fasting sugars tend to be in the 120s.  Hypertension: Currently taking amlodipine 2.5 mg daily and carvedilol 25 mg twice daily.  Recurrent UTIs: Taking trimethoprim 100 mg daily for recurrent UTI prevention, was prescribed by urology.  Continues to follow with them.   OBJECTIVE:   BP 131/64   Pulse 72   Ht '6\' 3"'$  (1.905 m)   Wt 203 lb 12.8 oz (92.4 kg)   SpO2 100%   BMI 25.47 kg/m  Gen: No acute distress, pleasant, cooperative HEENT: Normocephalic, atraumatic Heart: Regular rate and rhythm, no murmur Lungs: Clear to auscultation bilaterally, normal effort Neuro: Grossly nonfocal, speech normal Ext: No edema bilateral lower extremities  ASSESSMENT/PLAN:   Health maintenance:  -Interested in annual wellness visit, we will get him added to the list -COVID booster today -Declines shingles vaccine at this time  Diabetes mellitus (Louis Butler) Well-controlled with A1c of 6.6.  Continue current medication regimen.  Follow-up in 3 months.  Hypertension Well-controlled, continue current med regimen  Tobacco use Continues to smoke occasionally.  Encourage cessation.  Recurrent UTI Doing well on his suppressive trimethoprim which is prescribed by urology.  FOLLOW UP: Follow up in 3 months for above issues  Louis Butler, [place]

## 2022-05-01 NOTE — Patient Instructions (Signed)
It was great to see you again today.  COVID booster today A1c is great at 6.6  Follow up in 3 months, sooner if needed  Be well, Dr. Ardelia Mems

## 2022-05-02 DIAGNOSIS — N130 Hydronephrosis with ureteropelvic junction obstruction: Secondary | ICD-10-CM | POA: Diagnosis not present

## 2022-05-02 DIAGNOSIS — R3914 Feeling of incomplete bladder emptying: Secondary | ICD-10-CM | POA: Diagnosis not present

## 2022-05-02 NOTE — Assessment & Plan Note (Signed)
Well-controlled with A1c of 6.6.  Continue current medication regimen.  Follow-up in 3 months.

## 2022-05-02 NOTE — Assessment & Plan Note (Signed)
Well-controlled, continue current med regimen

## 2022-05-02 NOTE — Assessment & Plan Note (Signed)
Continues to smoke occasionally.  Encourage cessation.

## 2022-05-02 NOTE — Assessment & Plan Note (Signed)
Doing well on his suppressive trimethoprim which is prescribed by urology.

## 2022-05-28 ENCOUNTER — Encounter: Payer: Self-pay | Admitting: Family Medicine

## 2022-06-02 ENCOUNTER — Encounter: Payer: Self-pay | Admitting: Family Medicine

## 2022-06-02 ENCOUNTER — Ambulatory Visit (INDEPENDENT_AMBULATORY_CARE_PROVIDER_SITE_OTHER): Payer: Medicare Other | Admitting: Family Medicine

## 2022-06-02 VITALS — BP 137/70 | HR 69 | Ht 75.0 in | Wt 205.2 lb

## 2022-06-02 DIAGNOSIS — R221 Localized swelling, mass and lump, neck: Secondary | ICD-10-CM | POA: Diagnosis not present

## 2022-06-02 NOTE — Assessment & Plan Note (Addendum)
New problem, first presenting for this today.  Multiple features concerning for malignancy including duration, immobility, size, smoking history, and lack of tenderness.  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   Discussed with patient and his wife my concern about some form of cancer.  Follow-up is pending the results of the studies.  I will also refer to ENT for further evaluation.

## 2022-06-02 NOTE — Patient Instructions (Addendum)
It was great to see you again today.  Checking CT scan and also getting lab work today.  I am referring you to ENT for evaluation of this neck mass.  Be well, Dr. Ardelia Mems

## 2022-06-02 NOTE — Progress Notes (Signed)
  Date of Visit: 06/02/2022   SUBJECTIVE:   HPI:  Louis Butler presents today for evaluation of a neck mass.  He noticed the neck mass first in late December.  This was around the time he had a procedure done on his left arm fistula.  Denies any pain in the area.  He otherwise feels well.  Denies any night sweats, fevers, cough, or weight loss.  The mass has  overall stayed the same in size since he first noticed it.  OBJECTIVE:   BP 137/70   Pulse 69   Ht 6' 3"$  (1.905 m)   Wt 205 lb 3.2 oz (93.1 kg)   SpO2 99%   BMI 25.65 kg/m  Gen: No acute distress, pleasant, cooperative HEENT: Normocephalic.  Approximately 5 to 6 cm firm mass on the left anterolateral aspect of the neck.  No tenderness, erythema, warmth in this area.  The mass is largely immobile.  No other lymphadenopathy in the anterior neck or supraclavicular areas.  Oropharynx is clear.  No intraoral lesions noted. Heart: Regular rate and rhythm, no murmur Lungs: Clear bilaterally, normal respiratory effort Neuro: Grossly nonfocal, speech normal Lymph: Aside from neck mass, no lymphadenopathy appreciated in supraclavicular, axillary, or inguinal areas.       ASSESSMENT/PLAN:   Neck mass New problem, first presenting for this today.  Multiple features concerning for malignancy including duration, immobility, size, smoking history, and lack of tenderness.  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   Discussed with patient and his wife my concern about some form of cancer.  Follow-up is pending the results of the studies.  I will also refer to ENT for further evaluation.   FOLLOW UP: Referring to ENT for further evaluation of neck mass  [place] J. Ardelia Mems, [place]

## 2022-06-03 ENCOUNTER — Encounter: Payer: Self-pay | Admitting: Family Medicine

## 2022-06-03 DIAGNOSIS — E875 Hyperkalemia: Secondary | ICD-10-CM

## 2022-06-03 LAB — CBC WITH DIFFERENTIAL/PLATELET
Basophils Absolute: 0.1 10*3/uL (ref 0.0–0.2)
Basos: 1 %
EOS (ABSOLUTE): 0.3 10*3/uL (ref 0.0–0.4)
Eos: 5 %
Hematocrit: 32.2 % — ABNORMAL LOW (ref 37.5–51.0)
Hemoglobin: 10.7 g/dL — ABNORMAL LOW (ref 13.0–17.7)
Immature Grans (Abs): 0 10*3/uL (ref 0.0–0.1)
Immature Granulocytes: 0 %
Lymphocytes Absolute: 1.6 10*3/uL (ref 0.7–3.1)
Lymphs: 23 %
MCH: 32.7 pg (ref 26.6–33.0)
MCHC: 33.2 g/dL (ref 31.5–35.7)
MCV: 99 fL — ABNORMAL HIGH (ref 79–97)
Monocytes Absolute: 0.5 10*3/uL (ref 0.1–0.9)
Monocytes: 7 %
Neutrophils Absolute: 4.6 10*3/uL (ref 1.4–7.0)
Neutrophils: 64 %
Platelets: 159 10*3/uL (ref 150–450)
RBC: 3.27 x10E6/uL — ABNORMAL LOW (ref 4.14–5.80)
RDW: 13 % (ref 11.6–15.4)
WBC: 7.1 10*3/uL (ref 3.4–10.8)

## 2022-06-03 LAB — C-REACTIVE PROTEIN: CRP: 4 mg/L (ref 0–10)

## 2022-06-03 LAB — CMP14+EGFR
ALT: 9 IU/L (ref 0–44)
AST: 13 IU/L (ref 0–40)
Albumin/Globulin Ratio: 1.2 (ref 1.2–2.2)
Albumin: 3.9 g/dL (ref 3.9–4.9)
Alkaline Phosphatase: 64 IU/L (ref 44–121)
BUN/Creatinine Ratio: 7 — ABNORMAL LOW (ref 10–24)
BUN: 36 mg/dL — ABNORMAL HIGH (ref 8–27)
Bilirubin Total: 0.4 mg/dL (ref 0.0–1.2)
CO2: 18 mmol/L — ABNORMAL LOW (ref 20–29)
Calcium: 8.8 mg/dL (ref 8.6–10.2)
Chloride: 104 mmol/L (ref 96–106)
Creatinine, Ser: 5.09 mg/dL — ABNORMAL HIGH (ref 0.76–1.27)
Globulin, Total: 3.2 g/dL (ref 1.5–4.5)
Glucose: 136 mg/dL — ABNORMAL HIGH (ref 70–99)
Potassium: 6.1 mmol/L — ABNORMAL HIGH (ref 3.5–5.2)
Sodium: 137 mmol/L (ref 134–144)
Total Protein: 7.1 g/dL (ref 6.0–8.5)
eGFR: 12 mL/min/{1.73_m2} — ABNORMAL LOW (ref >59)

## 2022-06-03 LAB — SEDIMENTATION RATE: Sed Rate: 11 mm/hr (ref 0–30)

## 2022-06-03 LAB — HIV ANTIBODY (ROUTINE TESTING W REFLEX): HIV Screen 4th Generation wRfx: NONREACTIVE

## 2022-06-03 NOTE — Telephone Encounter (Signed)
Attempted to reach patient by phone to discuss hyperkalemia, no answer. Left voicemail asking him to call back ASAP.  Leeanne Rio, MD

## 2022-06-03 NOTE — Telephone Encounter (Signed)
Patient called back and I spoke with him about his elevated potassium level of 6.1.  He is supposed to be taking Veltassa 8.4 g once a day, prescribed by his nephrologist Dr. Posey Pronto.  He has not taken this in about a week, which likely explains the elevated level.  He does have Veltassa at home and is willing and able to take it. He is feeling well currently, without any symptoms attributable to elevated K.  He will go ahead and take a dose of Veltassa right now, and repeat another dose in the morning around 8 AM.  I scheduled a lab visit for him to come in the AM and recheck his potassium at 11:45am.  Discussed importance of adherence with Veltassa in order to prevent hyperkalemia.  Patient appreciative & agreeable.  Leeanne Rio, MD

## 2022-06-04 ENCOUNTER — Other Ambulatory Visit: Payer: Medicare Other

## 2022-06-04 DIAGNOSIS — E875 Hyperkalemia: Secondary | ICD-10-CM

## 2022-06-05 ENCOUNTER — Ambulatory Visit (HOSPITAL_COMMUNITY)
Admission: RE | Admit: 2022-06-05 | Discharge: 2022-06-05 | Disposition: A | Discharge status: Home / Self Care | Payer: Medicare Other | Source: Ambulatory Visit | Attending: Family Medicine | Admitting: Family Medicine

## 2022-06-05 DIAGNOSIS — R221 Localized swelling, mass and lump, neck: Secondary | ICD-10-CM | POA: Diagnosis not present

## 2022-06-05 LAB — BASIC METABOLIC PANEL
BUN/Creatinine Ratio: 8 — ABNORMAL LOW (ref 10–24)
BUN: 41 mg/dL — ABNORMAL HIGH (ref 8–27)
CO2: 17 mmol/L — ABNORMAL LOW (ref 20–29)
Calcium: 8.9 mg/dL (ref 8.6–10.2)
Chloride: 100 mmol/L (ref 96–106)
Creatinine, Ser: 5.07 mg/dL — ABNORMAL HIGH (ref 0.76–1.27)
Glucose: 106 mg/dL — ABNORMAL HIGH (ref 70–99)
Potassium: 5.7 mmol/L — ABNORMAL HIGH (ref 3.5–5.2)
Sodium: 133 mmol/L — ABNORMAL LOW (ref 134–144)
eGFR: 12 mL/min/{1.73_m2} — ABNORMAL LOW (ref >59)

## 2022-06-06 ENCOUNTER — Encounter: Payer: Self-pay | Admitting: Family Medicine

## 2022-06-06 ENCOUNTER — Telehealth: Payer: Self-pay | Admitting: Family Medicine

## 2022-06-06 DIAGNOSIS — E1129 Type 2 diabetes mellitus with other diabetic kidney complication: Secondary | ICD-10-CM

## 2022-06-06 DIAGNOSIS — E875 Hyperkalemia: Secondary | ICD-10-CM

## 2022-06-06 NOTE — Telephone Encounter (Signed)
Patient and his wife called back and I spoke with him.  Discussed results of CT scan, which are concerning for metastatic disease.  No known prior malignancy.  Next step is for him to get a tissue biopsy, likely through ENT.  I called [facility] to try to expedite him being scheduled, as it has not yet been scheduled after I placed referral earlier this week.  Spoke with someone in their office who took my information and are going to speak with her Freight forwarder.  I provided them my cell phone number so they can reach me.  Leeanne Rio, MD

## 2022-06-06 NOTE — Telephone Encounter (Signed)
Attempted to reach patient to discuss CT scan results. No answer. Left vm asking he call back  When he returns call please contact me so I can speak with him  Leeanne Rio, MD

## 2022-06-06 NOTE — Telephone Encounter (Signed)
Also discussed improved potassium level, although high still.  Continue Veltassa daily.  He has been taking this.  Lab visit scheduled for Tuesday of next week to monitor this again.  It is reassuring that it is below 5.8, which was the result the last time his nephrologist checked it.  He does have nephrology follow-up next month.  Leeanne Rio, MD

## 2022-06-06 NOTE — Telephone Encounter (Signed)
Received return call from [facility].  They are going to contact patient now to schedule him to be seen next week.  Leeanne Rio, MD

## 2022-06-06 NOTE — Telephone Encounter (Signed)
Attempted to reach patient to discuss CT scan results. No answer. Left vm asking he call back   When he returns call please contact me so I can speak with him   Leeanne Rio, MD

## 2022-06-09 DIAGNOSIS — R221 Localized swelling, mass and lump, neck: Secondary | ICD-10-CM | POA: Diagnosis not present

## 2022-06-09 DIAGNOSIS — C760 Malignant neoplasm of head, face and neck: Secondary | ICD-10-CM | POA: Diagnosis not present

## 2022-06-10 ENCOUNTER — Other Ambulatory Visit: Payer: Medicare Other

## 2022-06-10 DIAGNOSIS — E875 Hyperkalemia: Secondary | ICD-10-CM | POA: Diagnosis not present

## 2022-06-11 DIAGNOSIS — R221 Localized swelling, mass and lump, neck: Secondary | ICD-10-CM | POA: Diagnosis not present

## 2022-06-11 LAB — BASIC METABOLIC PANEL
BUN/Creatinine Ratio: 11 (ref 10–24)
BUN: 53 mg/dL — ABNORMAL HIGH (ref 8–27)
CO2: 16 mmol/L — ABNORMAL LOW (ref 20–29)
Calcium: 8.7 mg/dL (ref 8.6–10.2)
Chloride: 103 mmol/L (ref 96–106)
Creatinine, Ser: 5.03 mg/dL — ABNORMAL HIGH (ref 0.76–1.27)
Glucose: 139 mg/dL — ABNORMAL HIGH (ref 70–99)
Potassium: 5.4 mmol/L — ABNORMAL HIGH (ref 3.5–5.2)
Sodium: 133 mmol/L — ABNORMAL LOW (ref 134–144)
eGFR: 12 mL/min/{1.73_m2} — ABNORMAL LOW (ref >59)

## 2022-06-23 NOTE — Telephone Encounter (Signed)
Rosendo Gros, Do you know anything about this? Is he needing to change to a different insulin? I don't see any phone notes saying he needs to schedule with Dr. Valentina Lucks. I'd love to save Mr. Brkic a visit if possible right now since he's being newly diagnosed with cancer.  Thanks Leeanne Rio, MD

## 2022-06-27 DIAGNOSIS — I120 Hypertensive chronic kidney disease with stage 5 chronic kidney disease or end stage renal disease: Secondary | ICD-10-CM | POA: Diagnosis not present

## 2022-06-27 DIAGNOSIS — N185 Chronic kidney disease, stage 5: Secondary | ICD-10-CM | POA: Diagnosis not present

## 2022-06-27 DIAGNOSIS — N2581 Secondary hyperparathyroidism of renal origin: Secondary | ICD-10-CM | POA: Diagnosis not present

## 2022-06-27 DIAGNOSIS — D631 Anemia in chronic kidney disease: Secondary | ICD-10-CM | POA: Diagnosis not present

## 2022-07-03 ENCOUNTER — Other Ambulatory Visit (HOSPITAL_COMMUNITY): Payer: Self-pay | Admitting: Otolaryngology

## 2022-07-03 DIAGNOSIS — C7989 Secondary malignant neoplasm of other specified sites: Secondary | ICD-10-CM

## 2022-07-03 MED ORDER — LANTUS SOLOSTAR 100 UNIT/ML ~~LOC~~ SOPN: 6.0000 [IU] | PEN_INJECTOR | Freq: Every day | SUBCUTANEOUS | 1 refills | Status: DC

## 2022-07-17 ENCOUNTER — Telehealth: Payer: Self-pay | Admitting: *Deleted

## 2022-07-17 NOTE — Telephone Encounter (Signed)
Yesi,  This pt is ESRF and on HD; his procedure will need to be done at the hospital.  Thanks,  Cathlyn Parsons

## 2022-07-22 ENCOUNTER — Telehealth: Payer: Self-pay

## 2022-07-22 NOTE — Telephone Encounter (Signed)
Patient is on dialysis and per protocol cannot be done at [facility].  Chart was checked by Cathlyn Parsons and not approved.  Patient was called and verbalized understanding that he needed and OV.  OV scheduled for 6/10.

## 2022-07-25 ENCOUNTER — Telehealth: Payer: Self-pay

## 2022-07-25 ENCOUNTER — Ambulatory Visit (HOSPITAL_COMMUNITY)
Admission: RE | Admit: 2022-07-25 | Discharge: 2022-07-25 | Disposition: A | Discharge status: Home / Self Care | Payer: Medicare Other | Source: Ambulatory Visit | Attending: Otolaryngology | Admitting: Otolaryngology

## 2022-07-25 ENCOUNTER — Ambulatory Visit (INDEPENDENT_AMBULATORY_CARE_PROVIDER_SITE_OTHER): Payer: Medicare Other | Admitting: Family Medicine

## 2022-07-25 VITALS — BP 117/62 | HR 70 | Wt 194.8 lb

## 2022-07-25 DIAGNOSIS — M545 Low back pain, unspecified: Secondary | ICD-10-CM

## 2022-07-25 DIAGNOSIS — G8929 Other chronic pain: Secondary | ICD-10-CM | POA: Diagnosis not present

## 2022-07-25 DIAGNOSIS — R300 Dysuria: Secondary | ICD-10-CM | POA: Diagnosis not present

## 2022-07-25 DIAGNOSIS — C7989 Secondary malignant neoplasm of other specified sites: Secondary | ICD-10-CM | POA: Diagnosis not present

## 2022-07-25 DIAGNOSIS — C4442 Squamous cell carcinoma of skin of scalp and neck: Secondary | ICD-10-CM | POA: Diagnosis not present

## 2022-07-25 DIAGNOSIS — N390 Urinary tract infection, site not specified: Secondary | ICD-10-CM | POA: Diagnosis not present

## 2022-07-25 LAB — POCT UA - MICROSCOPIC ONLY: Epithelial cells, urine per micros: NONE SEEN

## 2022-07-25 LAB — POCT URINALYSIS DIP (MANUAL ENTRY)
Bilirubin, UA: NEGATIVE
Glucose, UA: NEGATIVE mg/dL
Ketones, POC UA: NEGATIVE mg/dL
Nitrite, UA: POSITIVE — AB
Protein Ur, POC: >=300 mg/dL — AB
Spec Grav, UA: 1.025 (ref 1.010–1.025)
Urobilinogen, UA: 0.2 E.U./dL
pH, UA: 6 (ref 5.0–8.0)

## 2022-07-25 LAB — GLUCOSE, CAPILLARY: Glucose-Capillary: 155 mg/dL — ABNORMAL HIGH (ref 70–99)

## 2022-07-25 MED ORDER — FLUDEOXYGLUCOSE F - 18 (FDG) INJECTION
10.2000 | Freq: Once | INTRAVENOUS | Status: AC
  Administered 2022-07-25: 10.29 via INTRAVENOUS

## 2022-07-25 MED ORDER — LIDOCAINE 4 % EX PTCH: 1.0000 | MEDICATED_PATCH | CUTANEOUS | 2 refills | Status: DC

## 2022-07-25 NOTE — Telephone Encounter (Signed)
Patients daughter calls nurse line requesting I reach out to patient and his wife.   Called wife.   Wife reports back pain over the last several days. She reports he has been having urinary frequency with very little output.   She denies any fevers or chills. Denies seeing any blood.   Patient has a PET scan today and [facility].   Patient scheduled for this afternoon for evaluation.   Precautions discussed in the meantime.

## 2022-07-25 NOTE — Progress Notes (Unsigned)
    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     Maury Dus, MD [facility]

## 2022-07-25 NOTE — Patient Instructions (Signed)
It was great to meet you!  Please see your urologist regarding your ongoing urinary symptoms.  For your back pain, this is likely related to arthritis in your back. I have sent a prescription for a lidocaine patch to use for pain relief. You can also take Tylenol as needed.  I recommend you see Dr Pollie Meyer in 1 month to follow-up on your back pain.  Take care, Dr Anner Crete

## 2022-07-27 DIAGNOSIS — M545 Low back pain, unspecified: Secondary | ICD-10-CM | POA: Insufficient documentation

## 2022-07-27 LAB — URINE CULTURE

## 2022-07-27 NOTE — Assessment & Plan Note (Addendum)
Chronic. Known degenerative disc disease and mild stenosis since at least 2018. Suspect this is main etiology. Metastatic disease is a possibility and he is awaiting results of PET scan which was done today. Unable to take NSAIDs due to ESRD. Advised Tylenol and lidocaine patch. Considered referral to PT but patient dealing with new cancer diagnosis so this was deferred. Follow up with PCP in 1 month.

## 2022-07-27 NOTE — Assessment & Plan Note (Signed)
Patient with chronic urinary symptoms including dysuria, frequency, cloudy urine which are unchanged. Will send today's urine for culture. Continue daily suppressive Trimethoprim for now and advised he reach out to his urologist for appointment.

## 2022-07-30 LAB — URINE CULTURE

## 2022-08-01 ENCOUNTER — Telehealth: Payer: Self-pay | Admitting: Family Medicine

## 2022-08-01 ENCOUNTER — Other Ambulatory Visit: Payer: Self-pay | Admitting: Otolaryngology

## 2022-08-01 NOTE — Telephone Encounter (Addendum)
Attempted to call patient to discuss results of urine culture. No answer x2. Left voicemail for him to call our office.   If he returns call, please ask if he's been able to follow-up with urology.  I would not recommend treating with antibiotics at this time since his symptoms are unchanged from baseline and he's had pseudomonas on his urine culture since at least January 2021 that has become progressively more resistant. I'm happy to discuss further if he'd like.   Maury Dus, MD PGY-3, [facility]

## 2022-08-04 ENCOUNTER — Telehealth: Payer: Self-pay | Admitting: *Deleted

## 2022-08-04 NOTE — Telephone Encounter (Signed)
Spoke with pt and permission to speak to wife given after pt verification. OV for 7/3/*24 @ 2:30 pm with Zehr PA made. Instructed them to come in 15 minutes prior to OV>

## 2022-08-04 NOTE — Telephone Encounter (Signed)
Spoke with wife but due to HIPPA could not inform her of the purpose of the call.  She stated that the patient was asleep and to call back in an hour.

## 2022-08-04 NOTE — Telephone Encounter (Signed)
Patient returning call. Please advise

## 2022-08-05 ENCOUNTER — Other Ambulatory Visit: Payer: Self-pay

## 2022-08-05 ENCOUNTER — Encounter (HOSPITAL_COMMUNITY): Payer: Self-pay | Admitting: Otolaryngology

## 2022-08-05 NOTE — Progress Notes (Signed)
SDW call  Patient was given pre-op instructions over the phone. Patient verbalized understanding of instructions provided.  Patient states he took himself off dialysis a "while" ago as it made him feel worse.     PCP - Dr. Ardine Bjork Nephrologist: Dr. Kathe Mariner Cardiologist - Denies   PPM/ICD - Denies  Chest x-ray - 06/01/2020 EKG -  DOS, 08/06/2022 Stress Test - ECHO -  Cardiac Cath -   Sleep Study/sleep apnea/CPAP: Denies  Type II diabetic.  States has not used his insulin in over a week as blood sugars have remained stable around 100 Fasting Blood sugar range:: 100-120 How often check sugars: Daily Insulin glargine (Lantus Solostar); if he is going to take it the morning of surgery, only use 3 units (50% regular dose).    Blood Thinner Instructions: Denies Aspirin Instructions:Denies   ERAS Protcol - Yes. Clear liquids until 0900 PRE-SURGERY Ensure or G2-    COVID TEST- n/a   Anesthesia review: Yes.  DM, HTN, ESRD, heart murmur, irregular heart beat.    Patient denies shortness of breath, fever, cough and chest pain over the phone call  Your procedure is scheduled on Wednesday Aug 06, 2022  Report to [facility] Main Entrance "A" at 0930  A.M., then check in with the Admitting office.  Call this number if you have problems the morning of surgery:  [phone]   If you have any questions prior to your surgery date call [phone]: Open Monday-Friday 8am-4pm If you experience any cold or flu symptoms such as cough, fever, chills, shortness of breath, etc. between now and your scheduled surgery, please notify us at the above number    Remember:  Do not eat after midnight the night before your surgery  You may drink clear liquids until 0900  the morning of your surgery.   Clear liquids allowed are: Water, Non-Citrus Juices (without pulp), Carbonated Beverages, Clear Tea, Black Coffee ONLY (NO MILK, CREAM OR POWDERED CREAMER of any kind), and Gatorade   Take these  medicines the morning of surgery with A SIP OF WATER:  Amlodipine, carvedilol, nexium pepcid  As needed: Tylenol, tramadol  As of today, STOP taking any Aspirin (unless otherwise instructed by your surgeon) Aleve, Naproxen, Ibuprofen, Motrin, Advil, Goody's, BC's, all herbal medications, fish oil, and all vitamins.

## 2022-08-05 NOTE — Anesthesia Preprocedure Evaluation (Signed)
Anesthesia Evaluation  Patient identified by MRN, date of birth, ID band Patient awake    Reviewed: Allergy & Precautions, NPO status , Patient's Chart, lab work & pertinent test results  Airway Mallampati: II  TM Distance: >3 FB Neck ROM: Full    Dental  (+) Dental Advisory Given   Pulmonary Current Smoker, former smoker   breath sounds clear to auscultation       Cardiovascular hypertension, Pt. on medications and Pt. on home beta blockers + CAD   Rhythm:Regular Rate:Normal     Neuro/Psych negative neurological ROS     GI/Hepatic Neg liver ROS,GERD  ,,  Endo/Other  diabetes, Type 2    Renal/GU CRFRenal disease     Musculoskeletal  (+) Arthritis ,    Abdominal   Peds  Hematology negative hematology ROS (+)   Anesthesia Other Findings   Reproductive/Obstetrics                             Anesthesia Physical Anesthesia Plan  ASA: 3  Anesthesia Plan: General   Post-op Pain Management: Tylenol PO (pre-op)* and Minimal or no pain anticipated   Induction: Intravenous  PONV Risk Score and Plan: 1 and Dexamethasone, Ondansetron and Treatment may vary due to age or medical condition  Airway Management Planned: Oral ETT  Additional Equipment: None  Intra-op Plan:   Post-operative Plan: Extubation in OR  Informed Consent: I have reviewed the patients History and Physical, chart, labs and discussed the procedure including the risks, benefits and alternatives for the proposed anesthesia with the patient or authorized representative who has indicated his/her understanding and acceptance.     Dental advisory given  Plan Discussed with: CRNA  Anesthesia Plan Comments: (  )       Anesthesia Quick Evaluation

## 2022-08-05 NOTE — Progress Notes (Signed)
Anesthesia Chart Review:  Case: [id number] Date/Time: 08/06/22 1132   Procedures:      DIRECT LARYNGOSCOPY WITH BIOPSY (Bilateral)     TONSILLECTOMY (Right)   Anesthesia type: General   Pre-op diagnosis: Neck mass   Location: [facility] ROOM 09 / [facility] OR   Surgeons: Christia Reading, MD       DISCUSSION: Patient is a 67 year old male scheduled for the above procedure. It appears case was moved from [facility] He presented to PCP on 06/02/22 with a neck mass. CT showed ~ 5.4 cm left level IB lymph node with other less prominent notes. Picture concerning for metastatic disease. He was referred to ENT. By notes, biopsy showed SCC.   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 Zetta Bills), GERD, murmur (trivial MR/AR/PR/TR 2022), irregular heart beat (without mention of afib), BPH (s/p TURP), alcohol use disorder (quit 02/2019).  He is followed at the [facility]. Last visit 07/25/22 for low back pain, possible UTI. Urine culture + Pseudomonas aeruginosa. He has chronic urinary symptoms followed by [facility] and is on chronic trimethoprim and Flomax. 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 Given recurrent positive culture with little change in symptoms, provider Dr. Anner Crete did not recommend treatment.   As above, he has been off of hemodialysis since ~ March 2022. Last nephrology note seen (scanned under Media tab) is from Dr. Allena Katz on 03/14/22. Creatinine 5.38, BUN 57, eGFR 11 then. Known GFR ~ 12-15 range. Euvolemic then without uremic symptoms. He referred him to [facility] to assess AVF due to concern for mid/outflow stenosis. 3 month follow-up recommended. He has had more recent labs through primary care, and required treatment for  hyperkalemia in February 2024. As of 06/10/22, K 5.4, Creatinine 5.03, glucose 139. H/H 10.7/32.2 on 06/02/22.    He is for updated labs and EKG on arrival. K 5.4 on 06/10/22. A1c 6.6% on 05/01/22. He said he made some dietary changes and is not currently taking insulin. Reported home CBGs in the 100's - 120's.   Anesthesia team to evaluate on the day of surgery.   VS:  BP Readings from Last 3 Encounters:  07/25/22 117/62  06/02/22 137/70  05/01/22 131/64   Pulse Readings from Last 3 Encounters:  07/25/22 70  06/02/22 69  05/01/22 72     PROVIDERS: Latrelle Dodrill, MD is PCP  Zetta Bills, MD is Nephrologist Alfredo Martinez, MD is Urologist. 05/02/22 office visit scanned under Media tab.   LABS: For day of surgery as indicated. Last results in [facility] include: Lab Results  Component Value Date   WBC 7.1 06/02/2022   HGB 10.7 (L) 06/02/2022   HCT 32.2 (L) 06/02/2022   PLT 159 06/02/2022   GLUCOSE 139 (H) 06/10/2022   ALT 9 06/02/2022   AST 13 06/02/2022   NA 133 (L) 06/10/2022   K 5.4 (H) 06/10/2022   CL 103 06/10/2022   CREATININE 5.03 (H) 06/10/2022   BUN 53 (H) 06/10/2022   CO2 16 (L) 06/10/2022   HGBA1C 6.6 05/01/2022     IMAGES: PET Scan 07/25/22: IMPRESSION: - Left cervical nodal metastasis, unchanged from recent CT neck. - No findings suspicious for distant metastases. - Hypermetabolic bladder wall thickening with perivesical stranding, favoring cystitis. Consider cystoscopy  as clinically warranted. - Associated moderate left hydroureteronephrosis extending to the UVJ, without obstructing calculus or soft tissue mass on unenhanced CT. Differential considerations include ascending infection versus distal ureteral stricture.  CT Soft Tissue Neck 06/04/22: IMPRESSION: 1. Enlarged and heterogeneous left level IB lymph node measuring up to 5.4 cm. Multiple mildly prominent lymph nodes measuring less than 1 cm are seen in level IB bilaterally and levels II and  III on the left. Findings are concerning for metastatic disease. 2. Dental decay with multiple periapical lucencies. 3. Calcified atherosclerotic plaques in the bilateral carotid bifurcation.   CT Chest LCS 02/04/22: IMPRESSION: 1. Lung-RADS 1, negative. Continue annual screening with low-dose chest CT without contrast in 12 months. 2. Pulmonary artery enlargement suggests pulmonary arterial hypertension. 3. Aortic atherosclerosis (ICD10-I70.0), coronary artery atherosclerosis and emphysema (ICD10-J43.9). 4. Right renal lesion which is, likely decreased in size compared to 03/14/2019. This is most consistent with a hemorrhagic/proteinaceous cyst . In the absence of clinically indicated signs/symptoms require(s) no independent follow-up. 5. Mild bilateral gynecomastia.    EKG: Last EKG noted is from 11/01/20: SR, LAD, anteroseptal infarct, age undermined..    CV:  PET Myocardial Perfusion Study 01/31/21 ([facility]): FINAL COMMENTS  Cardiac PET/CT Myocardial Perfusion Imaging Study Demonstrate No Evidence of Ischemia or Infarct By Visual Analysis..  1. Severe coronary calcifications.  2. No evidence of focal epicardial or microvascular disease.  3. Mildly reduced LVEF with abnormal left ventricular volumes. Increased RV uptake.    Stress echo 09/11/20 ([facility]): MILDLY ABNORMAL STRESS ECHOCARDIOGRAM.  NO DOPPLER PERFORMED FOR VALVULAR REGURGITATION  NO DOPPLER PERFORMED FOR VALVULAR STENOSIS  Note: NORMAL RESTING STUDY WITH WALL MOTION ABNORMALITIES SEEN AT SUBTARGET HR OF 130BPM (TARGET HR 131 BPM)  Maximum workload of  5.60 METs was achieved during exercise.  RESTING HYPERTENSION - EXAGGERATED RESPONSE    Echo 09/11/20 ([facility]): NORMAL LEFT VENTRICULAR SYSTOLIC FUNCTION  NORMAL RIGHT VENTRICULAR SYSTOLIC FUNCTION  VALVULAR REGURGITATION: TRIVIAL AR, TRIVIAL MR, TRIVIAL PR, TRIVIAL TR  NO VALVULAR STENOSIS  LVEF LOWER LIMITS OF NORMAL. Estimated EF > 55%. Calculated EF  50%. INSUFFICIENT TR TO ESTIMATE RVSP  NO PRIOR STUDY FOR COMPARISON    Past Medical History:  Diagnosis Date   Arthritis    Cellulitis 03/14/2019   feet   CKD (chronic kidney disease)    Diabetes mellitus without complication (HCC)    Type II   End-stage kidney disease (HCC) 03/14/2019   was on hemodialysis ~ June 2021 - March 2022 when he discontinued; followed by Dr. Zetta Bills   GERD (gastroesophageal reflux disease)    Heart murmur    History of blood transfusion    Hypertension    12/30/19- having low blood pressure since beginning   Irregular heart beat    UTI (urinary tract infection)     Past Surgical History:  Procedure Laterality Date   arm surgery Right    was cut to the bone   BASCILIC VEIN TRANSPOSITION Left 11/18/2019   Procedure: FIRST STAGE LEFT ARM BASCILIC VEIN TRANSPOSITION;  Surgeon: Nada Libman, MD;  Location: [facility];  Service: Vascular;  Laterality: Left;   BASCILIC VEIN TRANSPOSITION Left 01/04/2020   Procedure: LEFT SECOND STAGE BASCILIC VEIN TRANSPOSITION;  Surgeon: Nada Libman, MD;  Location: [facility];  Service: Vascular;  Laterality: Left;   COLONOSCOPY  06/2019   EYE SURGERY Bilateral    cataract   TRANSURETHRAL RESECTION OF PROSTATE N/A 08/08/2019   Procedure: TRANSURETHRAL RESECTION OF THE PROSTATE (  TURP);  Surgeon: Crista Elliot, MD;  Location: [facility]S;  Service: Urology;  Laterality: N/A;   UPPER GASTROINTESTINAL ENDOSCOPY  06/2019   WISDOM TOOTH EXTRACTION      MEDICATIONS: No current facility-administered medications for this encounter.    acetaminophen (TYLENOL) 500 MG tablet   amLODipine (NORVASC) 2.5 MG tablet   carvedilol (COREG) 25 MG tablet   Cholecalciferol (VITAMIN D3) 125 MCG (5000 UT) CAPS   esomeprazole (NEXIUM) 40 MG capsule   famotidine (PEPCID) 20 MG tablet   gabapentin (NEURONTIN) 300 MG capsule   insulin glargine (LANTUS SOLOSTAR) 100 UNIT/ML Solostar Pen   loperamide (IMODIUM A-D) 2 MG tablet   sodium  bicarbonate 650 MG tablet   tamsulosin (FLOMAX) 0.4 MG CAPS capsule   traMADol (ULTRAM) 50 MG tablet   trimethoprim (TRIMPEX) 100 MG tablet   VELTASSA 8.4 g packet   blood glucose meter kit and supplies KIT   Insulin Syringes, Disposable, U-100 0.5 ML MISC   lidocaine (HM LIDOCAINE PATCH) 4 %    Shonna Chock, PA-C Surgical Short Stay/Anesthesiology [facility] Phone [phone] [facility] Phone [phone] 08/05/2022 5:23 PM

## 2022-08-06 ENCOUNTER — Other Ambulatory Visit: Payer: Self-pay

## 2022-08-06 ENCOUNTER — Encounter (HOSPITAL_COMMUNITY)
Admission: RE | Disposition: A | Discharge status: Home / Self Care | Payer: Self-pay | Source: Home / Self Care | Attending: Otolaryngology

## 2022-08-06 ENCOUNTER — Encounter (HOSPITAL_COMMUNITY): Payer: Self-pay | Admitting: Otolaryngology

## 2022-08-06 ENCOUNTER — Ambulatory Visit (HOSPITAL_BASED_OUTPATIENT_CLINIC_OR_DEPARTMENT_OTHER): Payer: Medicare Other | Admitting: Anesthesiology

## 2022-08-06 ENCOUNTER — Ambulatory Visit (HOSPITAL_COMMUNITY)
Admission: RE | Admit: 2022-08-06 | Discharge: 2022-08-06 | Disposition: A | Discharge status: Home / Self Care | Payer: Medicare Other | Attending: Otolaryngology | Admitting: Otolaryngology

## 2022-08-06 ENCOUNTER — Ambulatory Visit (HOSPITAL_COMMUNITY): Payer: Medicare Other | Admitting: Anesthesiology

## 2022-08-06 ENCOUNTER — Encounter: Payer: Medicare Other | Admitting: Gastroenterology

## 2022-08-06 DIAGNOSIS — Z8249 Family history of ischemic heart disease and other diseases of the circulatory system: Secondary | ICD-10-CM | POA: Insufficient documentation

## 2022-08-06 DIAGNOSIS — Z79899 Other long term (current) drug therapy: Secondary | ICD-10-CM | POA: Diagnosis not present

## 2022-08-06 DIAGNOSIS — Z794 Long term (current) use of insulin: Secondary | ICD-10-CM | POA: Insufficient documentation

## 2022-08-06 DIAGNOSIS — N186 End stage renal disease: Secondary | ICD-10-CM | POA: Diagnosis not present

## 2022-08-06 DIAGNOSIS — K219 Gastro-esophageal reflux disease without esophagitis: Secondary | ICD-10-CM | POA: Insufficient documentation

## 2022-08-06 DIAGNOSIS — Z833 Family history of diabetes mellitus: Secondary | ICD-10-CM | POA: Insufficient documentation

## 2022-08-06 DIAGNOSIS — I120 Hypertensive chronic kidney disease with stage 5 chronic kidney disease or end stage renal disease: Secondary | ICD-10-CM | POA: Insufficient documentation

## 2022-08-06 DIAGNOSIS — E1122 Type 2 diabetes mellitus with diabetic chronic kidney disease: Secondary | ICD-10-CM | POA: Insufficient documentation

## 2022-08-06 DIAGNOSIS — I129 Hypertensive chronic kidney disease with stage 1 through stage 4 chronic kidney disease, or unspecified chronic kidney disease: Secondary | ICD-10-CM

## 2022-08-06 DIAGNOSIS — F17200 Nicotine dependence, unspecified, uncomplicated: Secondary | ICD-10-CM | POA: Diagnosis not present

## 2022-08-06 DIAGNOSIS — J038 Acute tonsillitis due to other specified organisms: Secondary | ICD-10-CM | POA: Insufficient documentation

## 2022-08-06 DIAGNOSIS — I2510 Atherosclerotic heart disease of native coronary artery without angina pectoris: Secondary | ICD-10-CM | POA: Insufficient documentation

## 2022-08-06 DIAGNOSIS — Z87891 Personal history of nicotine dependence: Secondary | ICD-10-CM | POA: Diagnosis not present

## 2022-08-06 DIAGNOSIS — K029 Dental caries, unspecified: Secondary | ICD-10-CM | POA: Insufficient documentation

## 2022-08-06 DIAGNOSIS — J039 Acute tonsillitis, unspecified: Secondary | ICD-10-CM | POA: Diagnosis not present

## 2022-08-06 DIAGNOSIS — A429 Actinomycosis, unspecified: Secondary | ICD-10-CM | POA: Diagnosis not present

## 2022-08-06 DIAGNOSIS — R221 Localized swelling, mass and lump, neck: Secondary | ICD-10-CM

## 2022-08-06 DIAGNOSIS — J040 Acute laryngitis: Secondary | ICD-10-CM | POA: Diagnosis not present

## 2022-08-06 DIAGNOSIS — N189 Chronic kidney disease, unspecified: Secondary | ICD-10-CM | POA: Diagnosis not present

## 2022-08-06 DIAGNOSIS — F17210 Nicotine dependence, cigarettes, uncomplicated: Secondary | ICD-10-CM | POA: Diagnosis not present

## 2022-08-06 DIAGNOSIS — M1990 Unspecified osteoarthritis, unspecified site: Secondary | ICD-10-CM | POA: Diagnosis not present

## 2022-08-06 DIAGNOSIS — C7989 Secondary malignant neoplasm of other specified sites: Secondary | ICD-10-CM | POA: Diagnosis present

## 2022-08-06 HISTORY — PX: TONSILLECTOMY: SHX5217

## 2022-08-06 HISTORY — PX: NASOPHARYNGEAL BIOPSY: SHX6488

## 2022-08-06 HISTORY — DX: Chronic kidney disease, unspecified: N18.9

## 2022-08-06 HISTORY — PX: DIRECT LARYNGOSCOPY: SHX5326

## 2022-08-06 LAB — CBC
HCT: 34.2 % — ABNORMAL LOW (ref 39.0–52.0)
Hemoglobin: 11.3 g/dL — ABNORMAL LOW (ref 13.0–17.0)
MCH: 33.3 pg (ref 26.0–34.0)
MCHC: 33 g/dL (ref 30.0–36.0)
MCV: 100.9 fL — ABNORMAL HIGH (ref 80.0–100.0)
Platelets: 174 10*3/uL (ref 150–400)
RBC: 3.39 MIL/uL — ABNORMAL LOW (ref 4.22–5.81)
RDW: 14.4 % (ref 11.5–15.5)
WBC: 7.6 10*3/uL (ref 4.0–10.5)
nRBC: 0 % (ref 0.0–0.2)

## 2022-08-06 LAB — GLUCOSE, CAPILLARY
Glucose-Capillary: 128 mg/dL — ABNORMAL HIGH (ref 70–99)
Glucose-Capillary: 98 mg/dL (ref 70–99)
Glucose-Capillary: 122 mg/dL — ABNORMAL HIGH (ref 70–99)

## 2022-08-06 LAB — BASIC METABOLIC PANEL
Anion gap: 9 (ref 5–15)
BUN: 53 mg/dL — ABNORMAL HIGH (ref 8–23)
CO2: 17 mmol/L — ABNORMAL LOW (ref 22–32)
Calcium: 8 mg/dL — ABNORMAL LOW (ref 8.9–10.3)
Chloride: 108 mmol/L (ref 98–111)
Creatinine, Ser: 5.58 mg/dL — ABNORMAL HIGH (ref 0.61–1.24)
GFR, Estimated: 10 mL/min — ABNORMAL LOW (ref >60)
Glucose, Bld: 130 mg/dL — ABNORMAL HIGH (ref 70–99)
Potassium: 4.6 mmol/L (ref 3.5–5.1)
Sodium: 134 mmol/L — ABNORMAL LOW (ref 135–145)

## 2022-08-06 SURGERY — LARYNGOSCOPY, DIRECT
Anesthesia: General | Laterality: Left

## 2022-08-06 MED ORDER — FENTANYL CITRATE (PF) 100 MCG/2ML IJ SOLN
INTRAMUSCULAR | Status: AC
  Administered 2022-08-06: 25 ug via INTRAVENOUS
  Filled 2022-08-06: qty 2

## 2022-08-06 MED ORDER — SUCCINYLCHOLINE CHLORIDE 200 MG/10ML IV SOSY
PREFILLED_SYRINGE | INTRAVENOUS | Status: DC | PRN
  Administered 2022-08-06: 100 mg via INTRAVENOUS

## 2022-08-06 MED ORDER — OXYMETAZOLINE HCL 0.05 % NA SOLN
NASAL | Status: AC
  Filled 2022-08-06: qty 30

## 2022-08-06 MED ORDER — LIDOCAINE 2% (20 MG/ML) 5 ML SYRINGE
INTRAMUSCULAR | Status: DC | PRN
  Administered 2022-08-06: 60 mg via INTRAVENOUS

## 2022-08-06 MED ORDER — PROPOFOL 10 MG/ML IV BOLUS
INTRAVENOUS | Status: AC
  Filled 2022-08-06: qty 20

## 2022-08-06 MED ORDER — SODIUM CHLORIDE 0.9 % IV SOLN: INTRAVENOUS | Status: DC

## 2022-08-06 MED ORDER — DEXAMETHASONE SODIUM PHOSPHATE 10 MG/ML IJ SOLN
INTRAMUSCULAR | Status: DC | PRN
  Administered 2022-08-06: 5 mg via INTRAVENOUS

## 2022-08-06 MED ORDER — FENTANYL CITRATE (PF) 100 MCG/2ML IJ SOLN: 25.0000 ug | INTRAMUSCULAR | Status: DC | PRN

## 2022-08-06 MED ORDER — 0.9 % SODIUM CHLORIDE (POUR BTL) OPTIME
TOPICAL | Status: DC | PRN
  Administered 2022-08-06: 1000 mL

## 2022-08-06 MED ORDER — CHLORHEXIDINE GLUCONATE 0.12 % MT SOLN: 15.0000 mL | Freq: Once | OROMUCOSAL | Status: AC

## 2022-08-06 MED ORDER — SUGAMMADEX SODIUM 200 MG/2ML IV SOLN
INTRAVENOUS | Status: DC | PRN
  Administered 2022-08-06 (×2): 100 mg via INTRAVENOUS

## 2022-08-06 MED ORDER — CEFAZOLIN SODIUM-DEXTROSE 2-4 GM/100ML-% IV SOLN
2.0000 g | INTRAVENOUS | Status: AC
  Administered 2022-08-06: 2 g via INTRAVENOUS

## 2022-08-06 MED ORDER — ROCURONIUM BROMIDE 10 MG/ML (PF) SYRINGE
PREFILLED_SYRINGE | INTRAVENOUS | Status: DC | PRN
  Administered 2022-08-06: 20 mg via INTRAVENOUS

## 2022-08-06 MED ORDER — CEFAZOLIN SODIUM-DEXTROSE 2-4 GM/100ML-% IV SOLN
INTRAVENOUS | Status: AC
  Filled 2022-08-06: qty 100

## 2022-08-06 MED ORDER — PROPOFOL 10 MG/ML IV BOLUS
INTRAVENOUS | Status: DC | PRN
  Administered 2022-08-06: 150 mg via INTRAVENOUS

## 2022-08-06 MED ORDER — TRAMADOL HCL 50 MG PO TABS
ORAL_TABLET | ORAL | Status: AC
  Administered 2022-08-06: 50 mg
  Filled 2022-08-06: qty 1

## 2022-08-06 MED ORDER — CHLORHEXIDINE GLUCONATE 0.12 % MT SOLN
OROMUCOSAL | Status: AC
  Administered 2022-08-06: 15 mL via OROMUCOSAL
  Filled 2022-08-06: qty 15

## 2022-08-06 MED ORDER — FENTANYL CITRATE (PF) 250 MCG/5ML IJ SOLN
INTRAMUSCULAR | Status: DC | PRN
  Administered 2022-08-06: 100 ug via INTRAVENOUS

## 2022-08-06 MED ORDER — EPINEPHRINE HCL (NASAL) 0.1 % NA SOLN
NASAL | Status: AC
  Filled 2022-08-06: qty 30

## 2022-08-06 MED ORDER — FENTANYL CITRATE (PF) 250 MCG/5ML IJ SOLN
INTRAMUSCULAR | Status: AC
  Filled 2022-08-06: qty 5

## 2022-08-06 MED ORDER — PROMETHAZINE HCL 25 MG/ML IJ SOLN: 6.2500 mg | INTRAMUSCULAR | Status: DC | PRN

## 2022-08-06 MED ORDER — ONDANSETRON HCL 4 MG/2ML IJ SOLN
INTRAMUSCULAR | Status: DC | PRN
  Administered 2022-08-06: 4 mg via INTRAVENOUS

## 2022-08-06 MED ORDER — ORAL CARE MOUTH RINSE: 15.0000 mL | Freq: Once | OROMUCOSAL | Status: AC

## 2022-08-06 SURGICAL SUPPLY — 48 items
BAG COUNTER SPONGE SURGICOUNT (BAG) ×3 IMPLANT
BAG SPNG CNTER NS LX DISP (BAG) ×2
BALLN PULM 15 16.5 18X75 (BALLOONS)
BALLOON PULM 15 16.5 18X75 (BALLOONS) IMPLANT
BLADE SURG 15 STRL LF DISP TIS (BLADE) IMPLANT
BLADE SURG 15 STRL SS (BLADE)
CANISTER SUCT 3000ML PPV (MISCELLANEOUS) ×3 IMPLANT
CATH ROBINSON RED A/P 10FR (CATHETERS) IMPLANT
CLEANER TIP ELECTROSURG 2X2 (MISCELLANEOUS) ×3 IMPLANT
CNTNR URN SCR LID CUP LEK RST (MISCELLANEOUS) IMPLANT
COAGULATOR SUCT SWTCH 10FR 6 (ELECTROSURGICAL) ×3 IMPLANT
CONT SPEC 4OZ STRL OR WHT (MISCELLANEOUS)
COVER BACK TABLE 60X90IN (DRAPES) ×3 IMPLANT
COVER MAYO STAND STRL (DRAPES) ×3 IMPLANT
DRAPE HALF SHEET 40X57 (DRAPES) ×3 IMPLANT
ELECT COATED BLADE 2.86 ST (ELECTRODE) ×3 IMPLANT
ELECT REM PT RETURN 9FT ADLT (ELECTROSURGICAL)
ELECT REM PT RETURN 9FT PED (ELECTROSURGICAL)
ELECTRODE REM PT RETRN 9FT PED (ELECTROSURGICAL) IMPLANT
ELECTRODE REM PT RTRN 9FT ADLT (ELECTROSURGICAL) IMPLANT
GAUZE 4X4 16PLY ~~LOC~~+RFID DBL (SPONGE) ×3 IMPLANT
GLOVE BIO SURGEON STRL SZ7.5 (GLOVE) ×3 IMPLANT
GOWN STRL REUS W/ TWL LRG LVL3 (GOWN DISPOSABLE) ×6 IMPLANT
GOWN STRL REUS W/TWL LRG LVL3 (GOWN DISPOSABLE) ×4
GUARD TEETH (MISCELLANEOUS) ×3 IMPLANT
KIT BASIN OR (CUSTOM PROCEDURE TRAY) ×3 IMPLANT
KIT TURNOVER KIT B (KITS) ×3 IMPLANT
NDL HYPO 25GX1X1/2 BEV (NEEDLE) IMPLANT
NDL TRANS ORAL INJECTION (NEEDLE) IMPLANT
NEEDLE HYPO 25GX1X1/2 BEV (NEEDLE) IMPLANT
NEEDLE TRANS ORAL INJECTION (NEEDLE) IMPLANT
NS IRRIG 1000ML POUR BTL (IV SOLUTION) ×3 IMPLANT
PACK BASIC III (CUSTOM PROCEDURE TRAY) ×2
PACK SRG BSC III STRL LF ECLPS (CUSTOM PROCEDURE TRAY) ×3 IMPLANT
PAD ARMBOARD 7.5X6 YLW CONV (MISCELLANEOUS) ×6 IMPLANT
PATTIES SURGICAL .5 X3 (DISPOSABLE) IMPLANT
PENCIL SMOKE EVACUATOR (MISCELLANEOUS) ×3 IMPLANT
POSITIONER HEAD DONUT 9IN (MISCELLANEOUS) IMPLANT
SOL ANTI FOG 6CC (MISCELLANEOUS) IMPLANT
SPECIMEN JAR SMALL (MISCELLANEOUS) ×6 IMPLANT
SPONGE TONSIL 1.25 RF SGL STRG (GAUZE/BANDAGES/DRESSINGS) IMPLANT
SURGILUBE 2OZ TUBE FLIPTOP (MISCELLANEOUS) IMPLANT
SYR BULB EAR ULCER 3OZ GRN STR (SYRINGE) ×3 IMPLANT
TOWEL GREEN STERILE FF (TOWEL DISPOSABLE) ×6 IMPLANT
TUBE CONNECTING 12X1/4 (SUCTIONS) ×3 IMPLANT
TUBE SALEM SUMP 14F (TUBING) ×3 IMPLANT
TUBE SALEM SUMP 16F (TUBING) IMPLANT
YANKAUER SUCT BULB TIP NO VENT (SUCTIONS) ×3 IMPLANT

## 2022-08-06 NOTE — Brief Op Note (Signed)
08/06/2022  1:04 PM  PATIENT:  Louis Butler  67 y.o. male  PRE-OPERATIVE DIAGNOSIS:  Neck mass  POST-OPERATIVE DIAGNOSIS:  Neck mass  PROCEDURE:  Procedure(s): DIRECT LARYNGOSCOPY WITH BIOPSY (Bilateral) TONSILLECTOMY (Left) NASOPHARYNGEAL BIOPSY (Left)  SURGEON:  Surgeon(s) and Role:    Christia Reading, MD - Primary  PHYSICIAN ASSISTANT:   ASSISTANTS: none   ANESTHESIA:   general  EBL:  25 mL   BLOOD ADMINISTERED:none  DRAINS: none   LOCAL MEDICATIONS USED:  NONE  SPECIMEN:  Source of Specimen:  Left pyriform sinus, left tongue base, left tonsil, and left nasopharynx  DISPOSITION OF SPECIMEN:  PATHOLOGY  COUNTS:  YES  TOURNIQUET:  * No tourniquets in log *  DICTATION: .Note written in EPIC  PLAN OF CARE: Discharge to home after PACU  PATIENT DISPOSITION:  PACU - hemodynamically stable.   Delay start of Pharmacological VTE agent (>24hrs) due to surgical blood loss or risk of bleeding: yes

## 2022-08-06 NOTE — Op Note (Signed)
Preop diagnosis: Left neck metastatic carcinoma, unknown primary Postop diagnosis: same Procedure: Direct laryngoscopy with biopsy, left tonsillectomy, and nasopharynx biopsy Surgeon: Jenne Pane Anesth: General endotracheal anesthesia Compl: None Indications: The patient is a 67 year old male with a history of left neck mass found to represent squamous cell carcinoma on FNA.  A primary source is not evident.  He presents for surgical evaluation and biopsy. Findings: No specific abnormalities seen in the pharynx, larynx, tonsil, or nasopharynx. Description:  After discussing risks, benefits, and alternatives, the patient was brought to the operating room and placed on the operative table in the supine position.  Anesthesia was induced and the patient was intubated by the anesthesia team without difficulty.  The eyes were taped closed and the bed was turned 90 degrees from anesthesia.  A tooth guard was placed over the upper teeth.  A Dedo laryngoscope was inserted and used to view the various areas of the pharynx and larynx including the endolarynx, post-cricoid area, pyriform sinuses, and vallecula.  Findings are noted above.  Biopsies were taken from the left pyriform sinus and left tongue base.  After completion, the laryngoscope was removed.  The oropharynx was exposed with a Crow-Davis retractor that was placed in suspension on the Mayo stand.   The left tonsil was grasped with a curved Allis and retracted medially while a curvilinear incision was made with the Bovie electrocautery.  Dissection continued in the subcapsular plane until the tonsil was removed.  The left tonsil was passed to nursing for pathology.  Bleeding was controlled using suction cautery.  A red rubber catheter was passed through the right nasal passage and pulled through the mouth to provide anterior retraction on the soft palate.  A laryngeal mirror was inserted to view the nasopharynx.  Biopsies were then taken from the left lateral  nasopharynx using cup forceps through the nasal passage.  After this was completed, the red rubber catheter was removed and the mouth and nose were copiously irrigated with saline.  A flexible suction catheter was passed down the esophagus to suction out the stomach and esophagus.  The Crow-Davis retractor was taken out of suspension and removed from the patient's mouth.  The patient was then turned back to anesthesia for wake up and was extubated and moved to the recovery room in stable condition.

## 2022-08-06 NOTE — Transfer of Care (Signed)
Immediate Anesthesia Transfer of Care Note  Patient: Louis Butler  Procedure(s) Performed: DIRECT LARYNGOSCOPY WITH BIOPSY (Bilateral) TONSILLECTOMY (Left) NASOPHARYNGEAL BIOPSY (Left)  Patient Location: PACU  Anesthesia Type:General  Level of Consciousness: awake, alert , and oriented  Airway & Oxygen Therapy: Patient Spontanous Breathing  Post-op Assessment: Report given to RN and Post -op Vital signs reviewed and stable  Post vital signs: Reviewed and stable  Last Vitals:  Vitals Value Taken Time  BP 134/67 08/06/22 1315  Temp    Pulse 66 08/06/22 1322  Resp 16 08/06/22 1322  SpO2 99 % 08/06/22 1322  Vitals shown include unvalidated device data.  Last Pain:  Vitals:   08/06/22 0936  TempSrc:   PainSc: 0-No pain         Complications: No notable events documented.

## 2022-08-06 NOTE — H&P (Addendum)
Louis Butler is an 67 y.o. male.   Chief Complaint: Neck mass HPI: 67 y.o. male has noticed a lump in the left neck for the past two months. It is firm and has remained stable. There is no pain. He has smoked since his teenage years averaging up to a pack per day. He was a heavy drinker but quit three years ago. FNA demonstrated squamous cell carcinoma.  A PET scan did not reveal the primary site.  Past Medical History:  Diagnosis Date   Arthritis    Cellulitis 03/14/2019   feet   CKD (chronic kidney disease)    Diabetes mellitus without complication (HCC)    Type II   End-stage kidney disease (HCC) 03/14/2019   was on hemodialysis ~ June 2021 - March 2022 when he discontinued; followed by Dr. Zetta Bills   GERD (gastroesophageal reflux disease)    Heart murmur    History of blood transfusion    Hypertension    12/30/19- having low blood pressure since beginning   Irregular heart beat    UTI (urinary tract infection)     Past Surgical History:  Procedure Laterality Date   arm surgery Right    was cut to the bone   BASCILIC VEIN TRANSPOSITION Left 11/18/2019   Procedure: FIRST STAGE LEFT ARM BASCILIC VEIN TRANSPOSITION;  Surgeon: Nada Libman, MD;  [facility];  Service: Vascular;  Laterality: Left;   BASCILIC VEIN TRANSPOSITION Left 01/04/2020   Procedure: LEFT SECOND STAGE BASCILIC VEIN TRANSPOSITION;  Surgeon: Nada Libman, MD;  Location[facility];  Service: Vascular;  Laterality: Left;   COLONOSCOPY  06/2019   EYE SURGERY Bilateral    cataract   TRANSURETHRAL RESECTION OF PROSTATE N/A 08/08/2019   Procedure: TRANSURETHRAL RESECTION OF THE PROSTATE (TURP);  Surgeon: Crista Elliot, MD;  Location[facility];  Service: Urology;  Laterality: N/A;   UPPER GASTROINTESTINAL ENDOSCOPY  06/2019   WISDOM TOOTH EXTRACTION      Family History  Problem Relation Age of Onset   Diabetes type II Mother    Lung cancer Mother    Hypertension Mother    Diabetes type II Father     Diabetes type II Sister    Diabetes type II Brother    Hypertension Brother    Other Son        burned   CAD Neg Hx    Colon cancer Neg Hx    Esophageal cancer Neg Hx    Inflammatory bowel disease Neg Hx    Liver disease Neg Hx    Pancreatic cancer Neg Hx    Rectal cancer Neg Hx    Stomach cancer Neg Hx    Social History:  reports that he has been smoking cigarettes. He has a 25.00 pack-year smoking history. He has never used smokeless tobacco. He reports that he does not currently use alcohol. He reports that he does not use drugs.  Allergies:  Allergies  Allergen Reactions   Hydrocodone Hives and Itching   Percocet [Oxycodone-Acetaminophen] Hives and Itching    Medications Prior to Admission  Medication Sig Dispense Refill   amLODipine (NORVASC) 2.5 MG tablet Take 1 tablet (2.5 mg total) by mouth at bedtime.     carvedilol (COREG) 25 MG tablet Take 25 mg by mouth 2 (two) times daily with a meal.     Cholecalciferol (VITAMIN D3) 125 MCG (5000 UT) CAPS Take 5,000 Units by mouth 2 (two) times a week.  esomeprazole (NEXIUM) 40 MG capsule TAKE 1 CAPSULE BY MOUTH ONCE DAILY AT  NOON (Patient taking differently: Take 40 mg by mouth daily at 12 noon.) 60 capsule 2   famotidine (PEPCID) 20 MG tablet Take 1 tablet (20 mg total) by mouth daily. 30 tablet 0   gabapentin (NEURONTIN) 300 MG capsule Take 1 capsule by mouth once daily at bedtime 90 capsule 3   insulin glargine (LANTUS SOLOSTAR) 100 UNIT/ML Solostar Pen Inject 6 Units into the skin daily. 15 mL 1   sodium bicarbonate 650 MG tablet Take 650 mg by mouth 2 (two) times daily.     tamsulosin (FLOMAX) 0.4 MG CAPS capsule Take 0.4 mg by mouth daily.     traMADol (ULTRAM) 50 MG tablet Take 1 tablet (50 mg total) by mouth every 6 (six) hours as needed. 20 tablet 0   trimethoprim (TRIMPEX) 100 MG tablet Take 100 mg by mouth daily.     VELTASSA 8.4 g packet Take 1 packet by mouth daily.     acetaminophen (TYLENOL) 500 MG tablet Take  1,000 mg by mouth every 6 (six) hours as needed for headache (pain).      blood glucose meter kit and supplies KIT Dispense based on patient and insurance preference. Use up to four times daily as directed. (FOR ICD-9 250.00, 250.01). 1 each 0   Insulin Syringes, Disposable, U-100 0.5 ML MISC Use as per instructions 3 times daily AC. 100 each 1   lidocaine (HM LIDOCAINE PATCH) 4 % Place 1 patch onto the skin daily. 30 patch 2   loperamide (IMODIUM A-D) 2 MG tablet Take 1 tablet (2 mg total) by mouth 4 (four) times daily as needed for diarrhea or loose stools. 30 tablet 0    Results for orders placed or performed during the hospital encounter of 08/06/22 (from the past 48 hour(s))  CBC per protocol     Status: Abnormal   Collection Time: 08/06/22  9:07 AM  Result Value Ref Range   WBC 7.6 4.0 - 10.5 K/uL   RBC 3.39 (L) 4.22 - 5.81 MIL/uL   Hemoglobin 11.3 (L) 13.0 - 17.0 g/dL   HCT 16.1 (L) 09.6 - 04.5 %   MCV 100.9 (H) 80.0 - 100.0 fL   MCH 33.3 26.0 - 34.0 pg   MCHC 33.0 30.0 - 36.0 g/dL   RDW 40.9 81.1 - 91.4 %   Platelets 174 150 - 400 K/uL   nRBC 0.0 0.0 - 0.2 %    Comment: Performed at [facility], [address], [place], [place] [address]  Basic metabolic panel per protocol     Status: Abnormal   Collection Time: 08/06/22  9:07 AM  Result Value Ref Range   Sodium 134 (L) 135 - 145 mmol/L   Potassium 4.6 3.5 - 5.1 mmol/L   Chloride 108 98 - 111 mmol/L   CO2 17 (L) 22 - 32 mmol/L   Glucose, Bld 130 (H) 70 - 99 mg/dL    Comment: Glucose reference range applies only to samples taken after fasting for at least 8 hours.   BUN 53 (H) 8 - 23 mg/dL   Creatinine, Ser 6.21 (H) 0.61 - 1.24 mg/dL   Calcium 8.0 (L) 8.9 - 10.3 mg/dL   GFR, Estimated 10 (L) >60 mL/min    Comment: (NOTE) Calculated using the CKD-EPI Creatinine Equation (2021)    Anion gap 9 5 - 15    Comment: Performed at [facility], [address] [place], [place] [address]  Glucose, capillary      Status: Abnormal   Collection Time: 08/06/22  9:22 AM  Result Value Ref Range   Glucose-Capillary 128 (H) 70 - 99 mg/dL    Comment: Glucose reference range applies only to samples taken after fasting for at least 8 hours.   Comment 1 Notify RN    Comment 2 Document in Chart    No results found.  Review of Systems  All other systems reviewed and are negative.   Blood pressure (!) 160/70, pulse 71, temperature 98 F (36.7 C), temperature source Oral, resp. rate 20, height 6\' 3"  (1.905 m), weight 93 kg, SpO2 100 %. Physical Exam Constitutional:      Appearance: Normal appearance. He is normal weight.  HENT:     Head: Normocephalic and atraumatic.     Right Ear: External ear normal.     Left Ear: External ear normal.     Nose: Nose normal.     Mouth/Throat:     Mouth: Mucous membranes are moist.     Pharynx: Oropharynx is clear.  Eyes:     Extraocular Movements: Extraocular movements intact.     Conjunctiva/sclera: Conjunctivae normal.     Pupils: Pupils are equal, round, and reactive to light.  Neck:     Comments: Firm left neck mass. Cardiovascular:     Rate and Rhythm: Normal rate.  Pulmonary:     Effort: Pulmonary effort is normal.  Skin:    General: Skin is warm and dry.  Neurological:     General: No focal deficit present.     Mental Status: He is alert and oriented to person, place, and time.  Psychiatric:        Mood and Affect: Mood normal.        Behavior: Behavior normal.        Thought Content: Thought content normal.        Judgment: Judgment normal.      Assessment/Plan Left neck metastatic carcinoma, unknown primary  To OR for direct laryngoscopy with biopsies and left tonsillectomy.  Christia Reading, MD 08/06/2022, 11:33 AM

## 2022-08-06 NOTE — Anesthesia Procedure Notes (Signed)
Procedure Name: Intubation Date/Time: 08/06/2022 12:25 PM  Performed by: Laruth Bouchard., CRNAPre-anesthesia Checklist: Patient identified, Emergency Drugs available, Suction available, Patient being monitored and Timeout performed Patient Re-evaluated:Patient Re-evaluated prior to induction Oxygen Delivery Method: Circle system utilized Preoxygenation: Pre-oxygenation with 100% oxygen Induction Type: IV induction Ventilation: Mask ventilation without difficulty and Oral airway inserted - appropriate to patient size Laryngoscope Size: Mac and 4 Grade View: Grade II Tube type: Oral Tube size: 7.5 mm Number of attempts: 1 Airway Equipment and Method: Stylet Placement Confirmation: ETT inserted through vocal cords under direct vision, positive ETCO2 and breath sounds checked- equal and bilateral Secured at: 22 cm Tube secured with: Tape Dental Injury: Teeth and Oropharynx as per pre-operative assessment

## 2022-08-06 NOTE — Anesthesia Postprocedure Evaluation (Signed)
Anesthesia Post Note  Patient: Louis Butler  Procedure(s) Performed: DIRECT LARYNGOSCOPY WITH BIOPSY (Bilateral) TONSILLECTOMY (Left) NASOPHARYNGEAL BIOPSY (Left)     Patient location during evaluation: PACU Anesthesia Type: General Level of consciousness: awake and alert Pain management: pain level controlled Vital Signs Assessment: post-procedure vital signs reviewed and stable Respiratory status: spontaneous breathing, nonlabored ventilation, respiratory function stable and patient connected to nasal cannula oxygen Cardiovascular status: blood pressure returned to baseline and stable Postop Assessment: no apparent nausea or vomiting Anesthetic complications: no  No notable events documented.  Last Vitals:  Vitals:   08/06/22 1315 08/06/22 1345  BP: 134/67 (!) 129/58  Pulse: 72 64  Resp: 14 16  Temp:  36.4 C  SpO2: 95% 100%    Last Pain:  Vitals:   08/06/22 1345  TempSrc:   PainSc: 6                  Kennieth Rad

## 2022-08-07 ENCOUNTER — Encounter (HOSPITAL_COMMUNITY): Payer: Self-pay | Admitting: Otolaryngology

## 2022-08-07 LAB — SURGICAL PATHOLOGY

## 2022-08-08 ENCOUNTER — Encounter (HOSPITAL_BASED_OUTPATIENT_CLINIC_OR_DEPARTMENT_OTHER): Admission: RE | Payer: Self-pay | Source: Home / Self Care

## 2022-08-08 ENCOUNTER — Ambulatory Visit (HOSPITAL_BASED_OUTPATIENT_CLINIC_OR_DEPARTMENT_OTHER): Admission: RE | Admit: 2022-08-08 | Payer: Medicare Other | Source: Home / Self Care | Admitting: Otolaryngology

## 2022-08-08 DIAGNOSIS — C7989 Secondary malignant neoplasm of other specified sites: Secondary | ICD-10-CM | POA: Diagnosis not present

## 2022-08-08 DIAGNOSIS — C801 Malignant (primary) neoplasm, unspecified: Secondary | ICD-10-CM | POA: Diagnosis not present

## 2022-08-08 SURGERY — LARYNGOSCOPY, DIRECT
Anesthesia: General | Laterality: Right

## 2022-08-21 ENCOUNTER — Telehealth: Payer: Self-pay | Admitting: Family Medicine

## 2022-08-21 ENCOUNTER — Ambulatory Visit: Payer: Medicare Other | Admitting: Family Medicine

## 2022-08-21 NOTE — Telephone Encounter (Signed)
Called patient to schedule Medicare Annual Wellness Visit (AWV). Left message for patient to call back and schedule Medicare Annual Wellness Visit (AWV).  Last date of AWV:  AWVI eligible as of 04/07/2009  Please schedule an AWVI appointment at any time with [facility] ANNUAL WELLNESS VISIT.  If any questions, please contact me at [phone].    Thank you,  Vanice Rappa  Ambulatory Clinic Support [facility] Direct dial  [phone]   

## 2022-08-27 DIAGNOSIS — H6122 Impacted cerumen, left ear: Secondary | ICD-10-CM | POA: Diagnosis not present

## 2022-08-27 DIAGNOSIS — D0422 Carcinoma in situ of skin of left ear and external auricular canal: Secondary | ICD-10-CM | POA: Diagnosis not present

## 2022-08-27 DIAGNOSIS — C770 Secondary and unspecified malignant neoplasm of lymph nodes of head, face and neck: Secondary | ICD-10-CM | POA: Diagnosis not present

## 2022-08-27 DIAGNOSIS — H6192 Disorder of left external ear, unspecified: Secondary | ICD-10-CM | POA: Diagnosis not present

## 2022-09-04 DIAGNOSIS — N185 Chronic kidney disease, stage 5: Secondary | ICD-10-CM | POA: Diagnosis not present

## 2022-09-04 DIAGNOSIS — N2581 Secondary hyperparathyroidism of renal origin: Secondary | ICD-10-CM | POA: Diagnosis not present

## 2022-09-04 DIAGNOSIS — D631 Anemia in chronic kidney disease: Secondary | ICD-10-CM | POA: Diagnosis not present

## 2022-09-04 DIAGNOSIS — I120 Hypertensive chronic kidney disease with stage 5 chronic kidney disease or end stage renal disease: Secondary | ICD-10-CM | POA: Diagnosis not present

## 2022-09-15 ENCOUNTER — Ambulatory Visit: Payer: Medicare Other | Admitting: Physician Assistant

## 2022-09-17 DIAGNOSIS — H6192 Disorder of left external ear, unspecified: Secondary | ICD-10-CM | POA: Diagnosis not present

## 2022-09-17 DIAGNOSIS — C44201 Unspecified malignant neoplasm of skin of unspecified ear and external auricular canal: Secondary | ICD-10-CM | POA: Diagnosis not present

## 2022-09-17 DIAGNOSIS — H908 Mixed conductive and sensorineural hearing loss, unspecified: Secondary | ICD-10-CM | POA: Diagnosis not present

## 2022-09-17 DIAGNOSIS — C770 Secondary and unspecified malignant neoplasm of lymph nodes of head, face and neck: Secondary | ICD-10-CM | POA: Diagnosis not present

## 2022-09-23 DIAGNOSIS — H6191 Disorder of right external ear, unspecified: Secondary | ICD-10-CM | POA: Diagnosis not present

## 2022-09-23 DIAGNOSIS — D0422 Carcinoma in situ of skin of left ear and external auricular canal: Secondary | ICD-10-CM | POA: Diagnosis not present

## 2022-09-23 DIAGNOSIS — M898X8 Other specified disorders of bone, other site: Secondary | ICD-10-CM | POA: Diagnosis not present

## 2022-09-23 DIAGNOSIS — H748X3 Other specified disorders of middle ear and mastoid, bilateral: Secondary | ICD-10-CM | POA: Diagnosis not present

## 2022-09-23 DIAGNOSIS — H908 Mixed conductive and sensorineural hearing loss, unspecified: Secondary | ICD-10-CM | POA: Diagnosis not present

## 2022-09-23 DIAGNOSIS — F17210 Nicotine dependence, cigarettes, uncomplicated: Secondary | ICD-10-CM | POA: Diagnosis not present

## 2022-09-23 DIAGNOSIS — C44201 Unspecified malignant neoplasm of skin of unspecified ear and external auricular canal: Secondary | ICD-10-CM | POA: Diagnosis not present

## 2022-09-23 DIAGNOSIS — L570 Actinic keratosis: Secondary | ICD-10-CM | POA: Diagnosis not present

## 2022-09-23 DIAGNOSIS — C44229 Squamous cell carcinoma of skin of left ear and external auricular canal: Secondary | ICD-10-CM | POA: Diagnosis not present

## 2022-09-23 DIAGNOSIS — D0421 Carcinoma in situ of skin of right ear and external auricular canal: Secondary | ICD-10-CM | POA: Diagnosis not present

## 2022-09-23 DIAGNOSIS — L905 Scar conditions and fibrosis of skin: Secondary | ICD-10-CM | POA: Diagnosis not present

## 2022-09-23 DIAGNOSIS — H903 Sensorineural hearing loss, bilateral: Secondary | ICD-10-CM | POA: Diagnosis not present

## 2022-09-23 DIAGNOSIS — C770 Secondary and unspecified malignant neoplasm of lymph nodes of head, face and neck: Secondary | ICD-10-CM | POA: Diagnosis not present

## 2022-09-23 DIAGNOSIS — H6192 Disorder of left external ear, unspecified: Secondary | ICD-10-CM | POA: Diagnosis not present

## 2022-09-23 DIAGNOSIS — H906 Mixed conductive and sensorineural hearing loss, bilateral: Secondary | ICD-10-CM | POA: Diagnosis not present

## 2022-09-25 ENCOUNTER — Encounter: Payer: Self-pay | Admitting: *Deleted

## 2022-10-06 ENCOUNTER — Ambulatory Visit: Payer: Medicare Other | Admitting: Gastroenterology

## 2022-10-08 ENCOUNTER — Ambulatory Visit: Payer: Medicare Other | Admitting: Gastroenterology

## 2022-10-08 DIAGNOSIS — D631 Anemia in chronic kidney disease: Secondary | ICD-10-CM | POA: Diagnosis not present

## 2022-10-08 DIAGNOSIS — N2581 Secondary hyperparathyroidism of renal origin: Secondary | ICD-10-CM | POA: Diagnosis not present

## 2022-10-08 DIAGNOSIS — N189 Chronic kidney disease, unspecified: Secondary | ICD-10-CM | POA: Diagnosis not present

## 2022-10-08 DIAGNOSIS — N185 Chronic kidney disease, stage 5: Secondary | ICD-10-CM | POA: Diagnosis not present

## 2022-10-08 DIAGNOSIS — I120 Hypertensive chronic kidney disease with stage 5 chronic kidney disease or end stage renal disease: Secondary | ICD-10-CM | POA: Diagnosis not present

## 2022-10-22 ENCOUNTER — Encounter: Payer: Self-pay | Admitting: Family Medicine

## 2022-10-22 ENCOUNTER — Ambulatory Visit (INDEPENDENT_AMBULATORY_CARE_PROVIDER_SITE_OTHER): Payer: Medicare Other | Admitting: Family Medicine

## 2022-10-22 VITALS — BP 146/69 | HR 61 | Ht 75.0 in | Wt 202.0 lb

## 2022-10-22 DIAGNOSIS — I10 Essential (primary) hypertension: Secondary | ICD-10-CM

## 2022-10-22 DIAGNOSIS — C44201 Unspecified malignant neoplasm of skin of unspecified ear and external auricular canal: Secondary | ICD-10-CM | POA: Diagnosis not present

## 2022-10-22 DIAGNOSIS — E1129 Type 2 diabetes mellitus with other diabetic kidney complication: Secondary | ICD-10-CM | POA: Diagnosis not present

## 2022-10-22 LAB — POCT GLYCOSYLATED HEMOGLOBIN (HGB A1C): HbA1c, POC (prediabetic range): 6.3 % (ref 5.7–6.4)

## 2022-10-22 MED ORDER — AMLODIPINE BESYLATE 5 MG PO TABS: 5.0000 mg | ORAL_TABLET | Freq: Every day | ORAL | 3 refills | Status: DC

## 2022-10-22 NOTE — Patient Instructions (Signed)
It was great to see you again today.  A1c is excellent at 6.3. Good luck with your surgery, I will be thinking about you guys.  Be well, Dr. Pollie Meyer

## 2022-10-22 NOTE — Progress Notes (Signed)
  Date of Visit: 10/22/2022   SUBJECTIVE:   HPI:  Mendel presents today for routine follow-up.  Diabetes: Currently taking Lantus 6 units daily.  Tolerating this well.  Not checking sugars regularly but denies having any symptoms of low blood sugars.  He is happy with this regimen.  A1c today 6.3.  Bilateral ear canal cancer: Last time I saw him in February he presented with a fixed new neck mass.  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  He has surgery scheduled on 7/25 at [facility].  He is coping reasonably well given all of this.  Hypertension: Currently taking amlodipine 2.5 mg daily and carvedilol 25 mg twice daily.  Tolerating these well.   OBJECTIVE:   BP (!) 146/69   Pulse 61   Ht 6\' 3"  (1.905 m)   Wt 202 lb (91.6 kg)   SpO2 100%   BMI 25.25 kg/m  Gen: No acute distress, pleasant, cooperative HEENT: Normocephalic, atraumatic, TMs clear bilaterally, erosions present on bilateral ears canals Heart: Regular rate and rhythm, no murmur Lungs: Clear to auscultation bilaterally, normal effort Neuro: Grossly nonfocal, speech normal Ext: No edema bilateral lower extremities  ASSESSMENT/PLAN:   Health maintenance:  -Agreeable to scheduling annual wellness visit, I will get him added to the list -Discussed he is due for colonoscopy, this is being deferred for now given his upcoming surgery for ear canal cancer bilaterally  Diabetes mellitus (HCC) well-controlled, continue current dose of Lantus.  Hypertension BP elevated including on recheck, increase amlodipine to 5 mg daily.  Patient reports it was high at his recent nephrology appointment as well.  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  FOLLOW UP: Follow up in 3 mos for diabetes, sooner if needed  [place] J. Pollie Meyer, MD [facility]

## 2022-10-24 DIAGNOSIS — C44201 Unspecified malignant neoplasm of skin of unspecified ear and external auricular canal: Secondary | ICD-10-CM | POA: Insufficient documentation

## 2022-10-24 NOTE — Assessment & Plan Note (Signed)
Wished him luck in his upcoming surgery.

## 2022-10-24 NOTE — Assessment & Plan Note (Addendum)
BP elevated including on recheck, increase amlodipine to 5 mg daily.  Patient reports it was high at his recent nephrology appointment as well.

## 2022-10-24 NOTE — Assessment & Plan Note (Signed)
well-controlled, continue current dose of Lantus.

## 2022-10-27 DIAGNOSIS — C770 Secondary and unspecified malignant neoplasm of lymph nodes of head, face and neck: Secondary | ICD-10-CM | POA: Diagnosis not present

## 2022-10-27 DIAGNOSIS — F17210 Nicotine dependence, cigarettes, uncomplicated: Secondary | ICD-10-CM | POA: Diagnosis not present

## 2022-10-27 DIAGNOSIS — I120 Hypertensive chronic kidney disease with stage 5 chronic kidney disease or end stage renal disease: Secondary | ICD-10-CM | POA: Diagnosis not present

## 2022-10-27 DIAGNOSIS — C7989 Secondary malignant neoplasm of other specified sites: Secondary | ICD-10-CM | POA: Diagnosis not present

## 2022-10-27 DIAGNOSIS — C44222 Squamous cell carcinoma of skin of right ear and external auricular canal: Secondary | ICD-10-CM | POA: Diagnosis not present

## 2022-10-27 DIAGNOSIS — E872 Acidosis, unspecified: Secondary | ICD-10-CM | POA: Diagnosis not present

## 2022-10-27 DIAGNOSIS — C799 Secondary malignant neoplasm of unspecified site: Secondary | ICD-10-CM | POA: Diagnosis not present

## 2022-10-27 DIAGNOSIS — C44221 Squamous cell carcinoma of skin of unspecified ear and external auricular canal: Secondary | ICD-10-CM | POA: Diagnosis not present

## 2022-10-27 DIAGNOSIS — C44201 Unspecified malignant neoplasm of skin of unspecified ear and external auricular canal: Secondary | ICD-10-CM | POA: Diagnosis not present

## 2022-10-27 DIAGNOSIS — N185 Chronic kidney disease, stage 5: Secondary | ICD-10-CM | POA: Diagnosis not present

## 2022-10-27 DIAGNOSIS — E871 Hypo-osmolality and hyponatremia: Secondary | ICD-10-CM | POA: Diagnosis not present

## 2022-10-27 DIAGNOSIS — C44229 Squamous cell carcinoma of skin of left ear and external auricular canal: Secondary | ICD-10-CM | POA: Diagnosis not present

## 2022-10-27 DIAGNOSIS — E1140 Type 2 diabetes mellitus with diabetic neuropathy, unspecified: Secondary | ICD-10-CM | POA: Diagnosis not present

## 2022-10-27 DIAGNOSIS — Z794 Long term (current) use of insulin: Secondary | ICD-10-CM | POA: Diagnosis not present

## 2022-10-27 DIAGNOSIS — E1122 Type 2 diabetes mellitus with diabetic chronic kidney disease: Secondary | ICD-10-CM | POA: Diagnosis not present

## 2022-10-27 DIAGNOSIS — D62 Acute posthemorrhagic anemia: Secondary | ICD-10-CM | POA: Diagnosis not present

## 2022-10-27 DIAGNOSIS — K219 Gastro-esophageal reflux disease without esophagitis: Secondary | ICD-10-CM | POA: Diagnosis not present

## 2022-10-30 DIAGNOSIS — C7989 Secondary malignant neoplasm of other specified sites: Secondary | ICD-10-CM | POA: Diagnosis not present

## 2022-10-30 DIAGNOSIS — C44229 Squamous cell carcinoma of skin of left ear and external auricular canal: Secondary | ICD-10-CM | POA: Diagnosis not present

## 2022-10-31 DIAGNOSIS — C44221 Squamous cell carcinoma of skin of unspecified ear and external auricular canal: Secondary | ICD-10-CM | POA: Diagnosis not present

## 2022-11-01 DIAGNOSIS — C44221 Squamous cell carcinoma of skin of unspecified ear and external auricular canal: Secondary | ICD-10-CM | POA: Diagnosis not present

## 2022-11-02 DIAGNOSIS — C44221 Squamous cell carcinoma of skin of unspecified ear and external auricular canal: Secondary | ICD-10-CM | POA: Diagnosis not present

## 2022-11-05 ENCOUNTER — Telehealth: Payer: Self-pay

## 2022-11-05 NOTE — Transitions of Care (Post Inpatient/ED Visit) (Signed)
   11/05/2022  Name: Louis Butler MRN: [id number] DOB: 1956/02/05  Today's TOC FU Call Status: Today's TOC FU Call Status:: Unsuccessful Call (1st Attempt) Unsuccessful Call (1st Attempt) Date: 11/05/22  Attempted to reach the patient regarding the most recent Inpatient/ED visit.  Follow Up Plan: Additional outreach attempts will be made to reach the patient to complete the Transitions of Care (Post Inpatient/ED visit) call.   Jodelle Gross, RN, BSN, CCM Care Management Coordinator [place]/[facility]

## 2022-11-06 ENCOUNTER — Telehealth: Payer: Self-pay

## 2022-11-06 NOTE — Transitions of Care (Post Inpatient/ED Visit) (Signed)
   11/06/2022  Name: Louis Butler MRN: [id number] DOB: [birth year]  Today's TOC FU Call Status: Today's TOC FU Call Status:: Unsuccessful Call (2nd Attempt) Unsuccessful Call (2nd Attempt) Date: 11/06/22  Attempted to reach the patient regarding the most recent Inpatient/ED visit.  Follow Up Plan: Additional outreach attempts will be made to reach the patient to complete the Transitions of Care (Post Inpatient/ED visit) call.   Jodelle Gross, RN, BSN, CCM Care Management Coordinator [place] Phone: [phone]/Fax: [phone]

## 2022-11-07 ENCOUNTER — Encounter: Payer: Self-pay | Admitting: *Deleted

## 2022-11-07 ENCOUNTER — Telehealth: Payer: Self-pay | Admitting: *Deleted

## 2022-11-07 NOTE — Transitions of Care (Post Inpatient/ED Visit) (Signed)
   11/07/2022  Name: CLIFFORD BENNINGER MRN: [id number] DOB: 24-Sep-1955  Today's TOC FU Call Status: Today's TOC FU Call Status:: Unsuccessful Call (3rd Attempt)  Attempted to reach the patient regarding the most recent Inpatient visit; left HIPAA compliant voice message requesting call back  Follow Up Plan: No further outreach attempts will be made at this time. We have been unable to contact the patient.  Caryl Pina, RN, BSN, CCRN Alumnus RN CM Care Coordination/ Transition of Care- [facility] Care Management [phone]: direct office

## 2022-11-12 DIAGNOSIS — C770 Secondary and unspecified malignant neoplasm of lymph nodes of head, face and neck: Secondary | ICD-10-CM | POA: Diagnosis not present

## 2022-11-12 DIAGNOSIS — Z09 Encounter for follow-up examination after completed treatment for conditions other than malignant neoplasm: Secondary | ICD-10-CM | POA: Diagnosis not present

## 2022-11-30 NOTE — Patient Instructions (Incomplete)
Mr. Sepanski , Thank you for taking time to come for your Medicare Wellness Visit. I appreciate your ongoing commitment to your health goals. Please review the following plan we discussed and let me know if I can assist you in the future.   Referrals/Orders/Follow-Ups/Clinician Recommendations: Aim for 30 minutes of exercise or brisk walking, 6-8 glasses of water, and 5 servings of fruits and vegetables each day.  This is a list of the screening recommended for you and due dates:  Health Maintenance  Topic Date Due   Zoster (Shingles) Vaccine (1 of 2) Never done   Complete foot exam   01/17/2022   COVID-19 Vaccine (5 - 2023-24 season) 06/26/2022   Colon Cancer Screening  06/28/2022   Flu Shot  11/06/2022   Eye exam for diabetics  11/23/2022   Screening for Lung Cancer  02/05/2023   Hemoglobin A1C  04/24/2023   Medicare Annual Wellness Visit  12/01/2023   DTaP/Tdap/Td vaccine (2 - Td or Tdap) 08/19/2025   Pneumonia Vaccine  Completed   Hepatitis C Screening  Completed   HPV Vaccine  Aged Out    Advanced directives: (ACP Link)Information on Advanced Care Planning can be found at [facility] [place] Advance Health Care Directives Advance Health Care Directives ([email or web address]   Next Medicare Annual Wellness Visit scheduled for next year: Yes

## 2022-11-30 NOTE — Progress Notes (Signed)
Subjective:   Louis Butler is a 67 y.o. male who presents for an Initial Medicare Annual Wellness Visit.  Visit Complete: {VISITMETHOD:[phone]}  Patient Medicare AWV questionnaire was completed by the patient on ***; I have confirmed that all information answered by patient is correct and no changes since this date.  Vital Signs: {telehealth vitals:30100}  Review of Systems    ***       Objective:    There were no vitals filed for this visit. There is no height or weight on file to calculate BMI.     10/22/2022    1:45 PM 08/06/2022    9:33 AM 06/02/2022    9:22 AM 05/01/2022   10:08 AM 01/30/2022   11:07 AM 10/17/2021    1:43 PM 07/02/2021   10:34 AM  Advanced Directives  Does Patient Have a Medical Advance Directive? No No No No No No No  Would patient like information on creating a medical advance directive? No - Patient declined   No - Patient declined       Current Medications (verified) Outpatient Encounter Medications as of 12/01/2022  Medication Sig   acetaminophen (TYLENOL) 500 MG tablet Take 1,000 mg by mouth every 6 (six) hours as needed for headache (pain).    amLODipine (NORVASC) 5 MG tablet Take 1 tablet (5 mg total) by mouth at bedtime.   blood glucose meter kit and supplies KIT Dispense based on patient and insurance preference. Use up to four times daily as directed. (FOR ICD-9 250.00, 250.01).   carvedilol (COREG) 25 MG tablet Take 25 mg by mouth 2 (two) times daily with a meal.   Cholecalciferol (VITAMIN D3) 125 MCG (5000 UT) CAPS Take 5,000 Units by mouth 2 (two) times a week.   esomeprazole (NEXIUM) 40 MG capsule TAKE 1 CAPSULE BY MOUTH ONCE DAILY AT  NOON (Patient taking differently: Take 40 mg by mouth daily as needed.)   famotidine (PEPCID) 20 MG tablet Take 1 tablet (20 mg total) by mouth daily. (Patient taking differently: Take 20 mg by mouth 2 (two) times daily as needed.)   gabapentin (NEURONTIN) 300 MG capsule Take 1 capsule by mouth once daily  at bedtime   insulin glargine (LANTUS SOLOSTAR) 100 UNIT/ML Solostar Pen Inject 6 Units into the skin daily.   Insulin Syringes, Disposable, U-100 0.5 ML MISC Use as per instructions 3 times daily AC.   loperamide (IMODIUM A-D) 2 MG tablet Take 1 tablet (2 mg total) by mouth 4 (four) times daily as needed for diarrhea or loose stools.   sodium bicarbonate 650 MG tablet Take 650 mg by mouth 2 (two) times daily.   tamsulosin (FLOMAX) 0.4 MG CAPS capsule Take 0.4 mg by mouth daily.   traMADol (ULTRAM) 50 MG tablet Take 1 tablet (50 mg total) by mouth every 6 (six) hours as needed.   trimethoprim (TRIMPEX) 100 MG tablet Take 100 mg by mouth daily.   VELTASSA 8.4 g packet Take 1 packet by mouth daily.   No facility-administered encounter medications on file as of 12/01/2022.    Allergies (verified) Hydrocodone and Percocet [oxycodone-acetaminophen]   History: Past Medical History:  Diagnosis Date   Arthritis    Cellulitis 03/14/2019   feet   CKD (chronic kidney disease)    Diabetes mellitus without complication (HCC)    Type II   End-stage kidney disease (HCC) 03/14/2019   was on hemodialysis ~ June 2021 - March 2022 when he discontinued; followed by Dr. Zetta Bills  GERD (gastroesophageal reflux disease)    Heart murmur    History of blood transfusion    Hypertension    12/30/19- having low blood pressure since beginning   Irregular heart beat    UTI (urinary tract infection)    Past Surgical History:  Procedure Laterality Date   arm surgery Right    was cut to the bone   BASCILIC VEIN TRANSPOSITION Left 11/18/2019   Procedure: FIRST STAGE LEFT ARM BASCILIC VEIN TRANSPOSITION;  Surgeon: Nada Libman, MD;  Location: [facility] OR;  Service: Vascular;  Laterality: Left;   BASCILIC VEIN TRANSPOSITION Left 01/04/2020   Procedure: LEFT SECOND STAGE BASCILIC VEIN TRANSPOSITION;  Surgeon: Nada Libman, MD;  Location: [facility] OR;  Service: Vascular;  Laterality: Left;   COLONOSCOPY  06/2019    DIRECT LARYNGOSCOPY Bilateral 08/06/2022   Procedure: DIRECT LARYNGOSCOPY WITH BIOPSY;  Surgeon: Christia Reading, MD;  Location: [facility] OR;  Service: ENT;  Laterality: Bilateral;   EYE SURGERY Bilateral    cataract   NASOPHARYNGEAL BIOPSY Left 08/06/2022   Procedure: NASOPHARYNGEAL BIOPSY;  Surgeon: Christia Reading, MD;  Location: [facility] OR;  Service: ENT;  Laterality: Left;   TONSILLECTOMY Left 08/06/2022   Procedure: TONSILLECTOMY;  Surgeon: Christia Reading, MD;  Location: [facility] OR;  Service: ENT;  Laterality: Left;   TRANSURETHRAL RESECTION OF PROSTATE N/A 08/08/2019   Procedure: TRANSURETHRAL RESECTION OF THE PROSTATE (TURP);  Surgeon: Crista Elliot, MD;  Location: [facility] ORS;  Service: Urology;  Laterality: N/A;   UPPER GASTROINTESTINAL ENDOSCOPY  06/2019   WISDOM TOOTH EXTRACTION     Family History  Problem Relation Age of Onset   Diabetes type II Mother    Lung cancer Mother    Hypertension Mother    Diabetes type II Father    Diabetes type II Sister    Diabetes type II Brother    Hypertension Brother    Other Son        burned   CAD Neg Hx    Colon cancer Neg Hx    Esophageal cancer Neg Hx    Inflammatory bowel disease Neg Hx    Liver disease Neg Hx    Pancreatic cancer Neg Hx    Rectal cancer Neg Hx    Stomach cancer Neg Hx    Social History   Socioeconomic History   Marital status: Married    Spouse name: Bjorn Loser   Number of children: 3   Years of education: Not on file   Highest education level: Not on file  Occupational History   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  Tobacco Use   Smoking status: Every Day    Current packs/day: 0.25    Average packs/day: 0.5 packs/day for 52.6 years (25.7 ttl pk-yrs)    Types: Cigarettes    Start date: 03/07/1969    Last attempt to quit: 03/08/2019   Smokeless tobacco: Never  Vaping Use   Vaping status: Never Used  Substance and Sexual Activity   Alcohol use: Not Currently    Comment: stopped drinking alcohol - November 2020    Drug use: No   Sexual activity: Not on file  Other Topics Concern   Not on file  Social History Narrative   Not on file   Social Determinants of Health   Financial Resource Strain: Not on file  Food Insecurity: Low Risk  (11/03/2022)   Received from [facility]   Food vital sign    Within the past 12 months, you worried that  your food would run out before you got money to buy more: Never true    Within the past 12 months, the food you bought just didn't last and you didn't have money to get more. : Never true  Transportation Needs: Not on file (11/03/2022)  Physical Activity: Not on file  Stress: Not on file  Social Connections: Not on file    Tobacco Counseling Ready to quit: Not Answered Counseling given: Not Answered   Clinical Intake:                        Activities of Daily Living    08/06/2022    9:39 AM 08/06/2022    9:37 AM  In your present state of health, do you have any difficulty performing the following activities:  Hearing?  1  Comment  left ear  Vision?  0  Difficulty concentrating or making decisions?  0  Walking or climbing stairs?  1  Dressing or bathing?  0  Doing errands, shopping? 0     Patient Care Team: Latrelle Dodrill, MD as PCP - General (Family Medicine) Center, [facility] Juanell Fairly, RN as [facility] Care Management  Indicate any recent Medical Services you may have received from other than [facility] providers in the past year (date may be approximate).     Assessment:   This is a routine wellness examination for Louis Butler.  Hearing/Vision screen No results found.  Dietary issues and exercise activities discussed:     Goals Addressed   None    Depression Screen    10/22/2022    1:44 PM 05/01/2022   10:08 AM 01/30/2022   11:06 AM 10/17/2021    1:42 PM 01/17/2021   10:25 AM 11/01/2020   11:33 AM 07/19/2020   10:40 AM  PHQ 2/9 Scores  PHQ - 2 Score 3 0 1 2 0 1 0  PHQ- 9 Score 6 2 4 8 2 7 6      Fall Risk    10/22/2022    1:44 PM 05/01/2022   10:08 AM 02/12/2022    1:01 PM 01/30/2022   11:47 AM 10/17/2021    1:45 PM  Fall Risk   Falls in the past year? 1 0 0 1 0  Number falls in past yr: 1 0  1   Injury with Fall? 0 0  0   Follow up Falls evaluation completed        MEDICARE RISK AT HOME:    TIMED UP AND GO:  Was the test performed? No    Cognitive Function:        Immunizations Immunization History  Administered Date(s) Administered   COVID-19, mRNA, vaccine(Comirnaty)12 years and older 05/01/2022   Fluad Quad(high Dose 65+) 01/18/2021, 01/30/2022   Influenza-Unspecified 02/06/2019   PFIZER Comirnaty(Gray Top)Covid-19 Tri-Sucrose Vaccine 07/19/2020, 08/09/2020   PNEUMOCOCCAL CONJUGATE-20 11/01/2020   Pfizer Covid-19 Vaccine Bivalent Booster 4yrs & up 01/17/2021   Pneumococcal Polysaccharide-23 04/14/2019   Tdap 08/20/2015    TDAP status: Up to date  {Flu Vaccine status:2101806}  Pneumococcal vaccine status: Up to date  Covid-19 vaccine status: Information provided on how to obtain vaccines.   Qualifies for Shingles Vaccine? Yes   Zostavax completed No   Shingrix Completed?: No.    Education has been provided regarding the importance of this vaccine. Patient has been advised to call insurance company to determine out of pocket expense if they have not yet received this vaccine. Advised may  also receive vaccine at local pharmacy or Health Dept. Verbalized acceptance and understanding.  Screening Tests Health Maintenance  Topic Date Due   Medicare Annual Wellness (AWV)  Never done   Zoster Vaccines- Shingrix (1 of 2) Never done   FOOT EXAM  01/17/2022   COVID-19 Vaccine (5 - 2023-24 season) 06/26/2022   Colonoscopy  06/28/2022   INFLUENZA VACCINE  11/06/2022   OPHTHALMOLOGY EXAM  11/23/2022   Lung Cancer Screening  02/05/2023   HEMOGLOBIN A1C  04/24/2023   DTaP/Tdap/Td (2 - Td or Tdap) 08/19/2025   Pneumonia Vaccine 87+ Years old  Completed    Hepatitis C Screening  Completed   HPV VACCINES  Aged Out    Health Maintenance  Health Maintenance Due  Topic Date Due   Medicare Annual Wellness (AWV)  Never done   Zoster Vaccines- Shingrix (1 of 2) Never done   FOOT EXAM  01/17/2022   COVID-19 Vaccine (5 - 2023-24 season) 06/26/2022   Colonoscopy  06/28/2022   INFLUENZA VACCINE  11/06/2022   OPHTHALMOLOGY EXAM  11/23/2022    {Colorectal cancer screening:2101809}  Lung Cancer Screening: (Low Dose CT Chest recommended if Age 52-80 years, 20 pack-year currently smoking OR have quit w/in 15years.) does not qualify.   Lung Cancer Screening Referral: n/a  Additional Screening:  Hepatitis C Screening: does qualify; Completed 03/15/19  Vision Screening: Recommended annual ophthalmology exams for early detection of glaucoma and other disorders of the eye. Is the patient up to date with their annual eye exam?  {YES/NO:21197} Who is the provider or what is the name of the office in which the patient attends annual eye exams? *** If pt is not established with a provider, would they like to be referred to a provider to establish care? {YES/NO:21197}.   Dental Screening: Recommended annual dental exams for proper oral hygiene  Diabetic Foot Exam: Diabetic Foot Exam: Overdue, Pt has been advised about the importance in completing this exam. Pt is scheduled for diabetic foot exam on at next office visit.  Community Resource Referral / Chronic Care Management: CRR required this visit?  {YES/NO:21197}  CCM required this visit?  {CCM Required choices:[phone]}    Plan:     I have personally reviewed and noted the following in the patient's chart:   Medical and social history Use of alcohol, tobacco or illicit drugs  Current medications and supplements including opioid prescriptions. {Opioid Prescriptions:[phone]} Functional ability and status Nutritional status Physical activity Advanced directives List of other  physicians Hospitalizations, surgeries, and ER visits in previous 12 months Vitals Screenings to include cognitive, depression, and falls Referrals and appointments  In addition, I have reviewed and discussed with patient certain preventive protocols, quality metrics, and best practice recommendations. A written personalized care plan for preventive services as well as general preventive health recommendations were provided to patient.     Kandis Fantasia [place], [place]   1/61/0960   After Visit Summary: {CHL AMB AWV After Visit [phone]}  Nurse Notes: ***

## 2022-12-01 ENCOUNTER — Ambulatory Visit (INDEPENDENT_AMBULATORY_CARE_PROVIDER_SITE_OTHER): Payer: Medicare Other

## 2022-12-01 VITALS — Ht 75.0 in | Wt 202.0 lb

## 2022-12-01 DIAGNOSIS — Z0000 Encounter for general adult medical examination without abnormal findings: Secondary | ICD-10-CM | POA: Diagnosis not present

## 2022-12-01 DIAGNOSIS — C770 Secondary and unspecified malignant neoplasm of lymph nodes of head, face and neck: Secondary | ICD-10-CM | POA: Diagnosis not present

## 2022-12-03 DIAGNOSIS — C4492 Squamous cell carcinoma of skin, unspecified: Secondary | ICD-10-CM | POA: Diagnosis not present

## 2022-12-05 DIAGNOSIS — N1330 Unspecified hydronephrosis: Secondary | ICD-10-CM | POA: Diagnosis not present

## 2022-12-05 DIAGNOSIS — I700 Atherosclerosis of aorta: Secondary | ICD-10-CM | POA: Diagnosis not present

## 2022-12-05 DIAGNOSIS — R590 Localized enlarged lymph nodes: Secondary | ICD-10-CM | POA: Diagnosis not present

## 2022-12-05 DIAGNOSIS — C770 Secondary and unspecified malignant neoplasm of lymph nodes of head, face and neck: Secondary | ICD-10-CM | POA: Diagnosis not present

## 2022-12-05 DIAGNOSIS — I2510 Atherosclerotic heart disease of native coronary artery without angina pectoris: Secondary | ICD-10-CM | POA: Diagnosis not present

## 2022-12-11 NOTE — Progress Notes (Signed)
This encounter was created in error - please disregard.

## 2022-12-12 DIAGNOSIS — C44201 Unspecified malignant neoplasm of skin of unspecified ear and external auricular canal: Secondary | ICD-10-CM | POA: Diagnosis not present

## 2022-12-12 DIAGNOSIS — Z09 Encounter for follow-up examination after completed treatment for conditions other than malignant neoplasm: Secondary | ICD-10-CM | POA: Diagnosis not present

## 2022-12-12 DIAGNOSIS — C770 Secondary and unspecified malignant neoplasm of lymph nodes of head, face and neck: Secondary | ICD-10-CM | POA: Diagnosis not present

## 2022-12-17 DIAGNOSIS — N189 Chronic kidney disease, unspecified: Secondary | ICD-10-CM | POA: Diagnosis not present

## 2022-12-17 DIAGNOSIS — D631 Anemia in chronic kidney disease: Secondary | ICD-10-CM | POA: Diagnosis not present

## 2022-12-17 DIAGNOSIS — N185 Chronic kidney disease, stage 5: Secondary | ICD-10-CM | POA: Diagnosis not present

## 2022-12-17 DIAGNOSIS — N2581 Secondary hyperparathyroidism of renal origin: Secondary | ICD-10-CM | POA: Diagnosis not present

## 2022-12-17 DIAGNOSIS — I120 Hypertensive chronic kidney disease with stage 5 chronic kidney disease or end stage renal disease: Secondary | ICD-10-CM | POA: Diagnosis not present

## 2022-12-24 ENCOUNTER — Ambulatory Visit (INDEPENDENT_AMBULATORY_CARE_PROVIDER_SITE_OTHER): Payer: Medicare Other | Admitting: Student

## 2022-12-24 VITALS — BP 128/60 | HR 47 | Ht 75.0 in | Wt 206.0 lb

## 2022-12-24 DIAGNOSIS — Z01818 Encounter for other preprocedural examination: Secondary | ICD-10-CM

## 2022-12-24 NOTE — Patient Instructions (Addendum)
It was great seeing you today.  As we discussed, -I will send the paperwork over to your dentist   If you have any questions or concerns, please feel free to call the clinic.   Have a wonderful day,  Dr. Darral Dash [facility] [phone]

## 2022-12-24 NOTE — Progress Notes (Signed)
    SUBJECTIVE:   CHIEF COMPLAINT / HPI:   Louis Butler is a pleasant 67 year old male here to discuss medical clearance for dental procedure. 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 He is also ESRD, but not currently on hemodialysis. His radiation oncologist is requiring complete removal of all teeth as they all have significant decay, before starting radiation therapy.  He will be undergoing general anesthesia with a hospital stay.  Allergies: No known drug allergies Surgical history:  -Lateral temporal bone resection and temporalis muscle flap/fascia graft and canaloplasty with wide local excision of skin cancer with local tissue arrangement from ear in 2024 -Transurethral resection of prostate 2021 -Basilic vein transposition in 2021 x 2 -Bilateral cataracts surgery -Wisdom tooth extraction  Resuscitation status is: Full code.  Discussed with patient and his wife.  She became tearful during this conversation, understandably.  For the time being, they wish to continue with full medical treatment and resuscitation including CPR, intubation, IV medications, defibrillation.  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  Extensive dental decay in all teeth with significant caries.  No obvious abscess or signs of infection.  Dry mucous membranes. Cardiac: Regular rate and rhythm Respiratory: Normal work of breathing on room air without wheezing or crackles Skin: Multiple areas of hyperpigmentation over the upper extremities.  No rashes, open  wounds. Neuro: A&O x 4.  Speech clear and fluent.  No focal deficits.   ASSESSMENT/PLAN:   Preop examination Plan for dental surgery under general anesthesia. No indication for preoperative antibiotics as he has no history of infectious endocarditis or prosthetic joints. See risk calculation below, placing him at above average risk for almost all outcomes.Reassuringly,, he recently had lateral temporal bone resection with muscle flap graft under general anesthesia and did not have any major postoperative complications.       Darral Dash, DO [facility]

## 2022-12-25 DIAGNOSIS — C770 Secondary and unspecified malignant neoplasm of lymph nodes of head, face and neck: Secondary | ICD-10-CM | POA: Diagnosis not present

## 2022-12-25 DIAGNOSIS — Z87891 Personal history of nicotine dependence: Secondary | ICD-10-CM | POA: Diagnosis not present

## 2022-12-25 DIAGNOSIS — Z9089 Acquired absence of other organs: Secondary | ICD-10-CM | POA: Diagnosis not present

## 2022-12-25 DIAGNOSIS — D0422 Carcinoma in situ of skin of left ear and external auricular canal: Secondary | ICD-10-CM | POA: Diagnosis not present

## 2022-12-25 DIAGNOSIS — N189 Chronic kidney disease, unspecified: Secondary | ICD-10-CM | POA: Diagnosis not present

## 2022-12-25 DIAGNOSIS — D0421 Carcinoma in situ of skin of right ear and external auricular canal: Secondary | ICD-10-CM | POA: Diagnosis not present

## 2022-12-25 DIAGNOSIS — Z9009 Acquired absence of other part of head and neck: Secondary | ICD-10-CM | POA: Diagnosis not present

## 2022-12-25 DIAGNOSIS — K089 Disorder of teeth and supporting structures, unspecified: Secondary | ICD-10-CM | POA: Diagnosis not present

## 2022-12-29 NOTE — Assessment & Plan Note (Addendum)
Plan for dental surgery under general anesthesia. No indication for preoperative antibiotics as he has no history of infectious endocarditis or prosthetic joints. See risk calculation below, placing him at above average risk for almost all outcomes.Reassuringly,, he recently had lateral temporal bone resection with muscle flap graft under general anesthesia and did not have any major postoperative complications.

## 2023-01-01 ENCOUNTER — Other Ambulatory Visit: Payer: Self-pay | Admitting: Family Medicine

## 2023-01-02 ENCOUNTER — Telehealth: Payer: Self-pay | Admitting: Family Medicine

## 2023-01-02 NOTE — Telephone Encounter (Signed)
Someone from the [facility] at [facility] is calling to check on the status of dental clearance form for patients dental surgery. He is scheduled in the OR for 01/06/23.   She states she saw in care everywhere the note that Dr. Melissa Noon documented she would fax it over. This visit was 12/24/22. I did not see the form in the has been faxed pile.    Dr. Melissa Noon, have you completed this and if so do you know where it is?  They are needing it as soon as possible.

## 2023-01-06 DIAGNOSIS — K029 Dental caries, unspecified: Secondary | ICD-10-CM | POA: Diagnosis not present

## 2023-01-06 DIAGNOSIS — K089 Disorder of teeth and supporting structures, unspecified: Secondary | ICD-10-CM | POA: Diagnosis not present

## 2023-01-06 DIAGNOSIS — K05322 Chronic periodontitis, generalized, moderate: Secondary | ICD-10-CM | POA: Diagnosis not present

## 2023-01-06 DIAGNOSIS — E119 Type 2 diabetes mellitus without complications: Secondary | ICD-10-CM | POA: Diagnosis not present

## 2023-01-06 DIAGNOSIS — C760 Malignant neoplasm of head, face and neck: Secondary | ICD-10-CM | POA: Diagnosis not present

## 2023-01-07 ENCOUNTER — Other Ambulatory Visit: Payer: Self-pay

## 2023-01-12 MED ORDER — GABAPENTIN 300 MG PO CAPS: 300.0000 mg | ORAL_CAPSULE | Freq: Every day | ORAL | 3 refills | Status: DC

## 2023-01-19 ENCOUNTER — Other Ambulatory Visit: Payer: Self-pay | Admitting: Family Medicine

## 2023-01-19 DIAGNOSIS — E1129 Type 2 diabetes mellitus with other diabetic kidney complication: Secondary | ICD-10-CM

## 2023-01-22 DIAGNOSIS — C770 Secondary and unspecified malignant neoplasm of lymph nodes of head, face and neck: Secondary | ICD-10-CM | POA: Diagnosis not present

## 2023-01-22 DIAGNOSIS — C760 Malignant neoplasm of head, face and neck: Secondary | ICD-10-CM | POA: Diagnosis not present

## 2023-01-22 DIAGNOSIS — Z8589 Personal history of malignant neoplasm of other organs and systems: Secondary | ICD-10-CM | POA: Diagnosis not present

## 2023-01-22 DIAGNOSIS — Z87891 Personal history of nicotine dependence: Secondary | ICD-10-CM | POA: Diagnosis not present

## 2023-01-22 DIAGNOSIS — K08109 Complete loss of teeth, unspecified cause, unspecified class: Secondary | ICD-10-CM | POA: Diagnosis not present

## 2023-01-30 DIAGNOSIS — H02155 Paralytic ectropion of left lower eyelid: Secondary | ICD-10-CM | POA: Diagnosis not present

## 2023-01-30 DIAGNOSIS — G510 Bell's palsy: Secondary | ICD-10-CM | POA: Diagnosis not present

## 2023-02-03 DIAGNOSIS — H906 Mixed conductive and sensorineural hearing loss, bilateral: Secondary | ICD-10-CM | POA: Diagnosis not present

## 2023-02-03 DIAGNOSIS — Z01118 Encounter for examination of ears and hearing with other abnormal findings: Secondary | ICD-10-CM | POA: Diagnosis not present

## 2023-02-03 DIAGNOSIS — H7391 Unspecified disorder of tympanic membrane, right ear: Secondary | ICD-10-CM | POA: Diagnosis not present

## 2023-02-12 ENCOUNTER — Other Ambulatory Visit: Payer: Self-pay

## 2023-02-16 DIAGNOSIS — N185 Chronic kidney disease, stage 5: Secondary | ICD-10-CM | POA: Diagnosis not present

## 2023-02-16 DIAGNOSIS — N390 Urinary tract infection, site not specified: Secondary | ICD-10-CM | POA: Diagnosis not present

## 2023-02-19 DIAGNOSIS — I120 Hypertensive chronic kidney disease with stage 5 chronic kidney disease or end stage renal disease: Secondary | ICD-10-CM | POA: Diagnosis not present

## 2023-02-19 DIAGNOSIS — N2581 Secondary hyperparathyroidism of renal origin: Secondary | ICD-10-CM | POA: Diagnosis not present

## 2023-02-19 DIAGNOSIS — N185 Chronic kidney disease, stage 5: Secondary | ICD-10-CM | POA: Diagnosis not present

## 2023-02-19 DIAGNOSIS — D631 Anemia in chronic kidney disease: Secondary | ICD-10-CM | POA: Diagnosis not present

## 2023-04-29 ENCOUNTER — Inpatient Hospital Stay (HOSPITAL_COMMUNITY)
Admission: EM | Admit: 2023-04-29 | Discharge: 2023-06-13 | DRG: 871 | Disposition: A | Discharge status: Other Acute Inpatient Hospital | Payer: Medicare Other | Attending: Family Medicine | Admitting: Family Medicine

## 2023-04-29 ENCOUNTER — Encounter (HOSPITAL_COMMUNITY): Payer: Self-pay | Admitting: Emergency Medicine

## 2023-04-29 ENCOUNTER — Telehealth: Payer: Self-pay | Admitting: Student

## 2023-04-29 ENCOUNTER — Other Ambulatory Visit: Payer: Self-pay

## 2023-04-29 DIAGNOSIS — Z751 Person awaiting admission to adequate facility elsewhere: Secondary | ICD-10-CM | POA: Diagnosis not present

## 2023-04-29 DIAGNOSIS — E440 Moderate protein-calorie malnutrition: Secondary | ICD-10-CM | POA: Diagnosis not present

## 2023-04-29 DIAGNOSIS — G9349 Other encephalopathy: Secondary | ICD-10-CM | POA: Diagnosis present

## 2023-04-29 DIAGNOSIS — N186 End stage renal disease: Secondary | ICD-10-CM | POA: Diagnosis not present

## 2023-04-29 DIAGNOSIS — N189 Chronic kidney disease, unspecified: Secondary | ICD-10-CM | POA: Diagnosis not present

## 2023-04-29 DIAGNOSIS — R109 Unspecified abdominal pain: Secondary | ICD-10-CM | POA: Diagnosis not present

## 2023-04-29 DIAGNOSIS — Z452 Encounter for adjustment and management of vascular access device: Secondary | ICD-10-CM | POA: Diagnosis not present

## 2023-04-29 DIAGNOSIS — F32A Depression, unspecified: Secondary | ICD-10-CM | POA: Diagnosis present

## 2023-04-29 DIAGNOSIS — E876 Hypokalemia: Secondary | ICD-10-CM | POA: Insufficient documentation

## 2023-04-29 DIAGNOSIS — E860 Dehydration: Secondary | ICD-10-CM | POA: Diagnosis not present

## 2023-04-29 DIAGNOSIS — N281 Cyst of kidney, acquired: Secondary | ICD-10-CM | POA: Diagnosis not present

## 2023-04-29 DIAGNOSIS — I129 Hypertensive chronic kidney disease with stage 1 through stage 4 chronic kidney disease, or unspecified chronic kidney disease: Secondary | ICD-10-CM | POA: Diagnosis not present

## 2023-04-29 DIAGNOSIS — C44201 Unspecified malignant neoplasm of skin of unspecified ear and external auricular canal: Secondary | ICD-10-CM | POA: Diagnosis not present

## 2023-04-29 DIAGNOSIS — Z794 Long term (current) use of insulin: Secondary | ICD-10-CM | POA: Diagnosis not present

## 2023-04-29 DIAGNOSIS — K219 Gastro-esophageal reflux disease without esophagitis: Secondary | ICD-10-CM | POA: Diagnosis not present

## 2023-04-29 DIAGNOSIS — E875 Hyperkalemia: Secondary | ICD-10-CM | POA: Diagnosis not present

## 2023-04-29 DIAGNOSIS — M47812 Spondylosis without myelopathy or radiculopathy, cervical region: Secondary | ICD-10-CM | POA: Diagnosis not present

## 2023-04-29 DIAGNOSIS — N390 Urinary tract infection, site not specified: Secondary | ICD-10-CM

## 2023-04-29 DIAGNOSIS — R338 Other retention of urine: Secondary | ICD-10-CM | POA: Diagnosis not present

## 2023-04-29 DIAGNOSIS — E1122 Type 2 diabetes mellitus with diabetic chronic kidney disease: Secondary | ICD-10-CM | POA: Diagnosis not present

## 2023-04-29 DIAGNOSIS — K8020 Calculus of gallbladder without cholecystitis without obstruction: Secondary | ICD-10-CM | POA: Diagnosis not present

## 2023-04-29 DIAGNOSIS — N134 Hydroureter: Secondary | ICD-10-CM | POA: Diagnosis not present

## 2023-04-29 DIAGNOSIS — M4802 Spinal stenosis, cervical region: Secondary | ICD-10-CM | POA: Diagnosis not present

## 2023-04-29 DIAGNOSIS — D649 Anemia, unspecified: Secondary | ICD-10-CM | POA: Diagnosis not present

## 2023-04-29 DIAGNOSIS — Z9841 Cataract extraction status, right eye: Secondary | ICD-10-CM

## 2023-04-29 DIAGNOSIS — E8889 Other specified metabolic disorders: Secondary | ICD-10-CM | POA: Diagnosis not present

## 2023-04-29 DIAGNOSIS — E119 Type 2 diabetes mellitus without complications: Secondary | ICD-10-CM

## 2023-04-29 DIAGNOSIS — I499 Cardiac arrhythmia, unspecified: Secondary | ICD-10-CM | POA: Diagnosis not present

## 2023-04-29 DIAGNOSIS — R112 Nausea with vomiting, unspecified: Secondary | ICD-10-CM | POA: Diagnosis not present

## 2023-04-29 DIAGNOSIS — N19 Unspecified kidney failure: Secondary | ICD-10-CM | POA: Diagnosis not present

## 2023-04-29 DIAGNOSIS — Z8589 Personal history of malignant neoplasm of other organs and systems: Secondary | ICD-10-CM

## 2023-04-29 DIAGNOSIS — R4589 Other symptoms and signs involving emotional state: Secondary | ICD-10-CM | POA: Diagnosis present

## 2023-04-29 DIAGNOSIS — M542 Cervicalgia: Secondary | ICD-10-CM | POA: Diagnosis not present

## 2023-04-29 DIAGNOSIS — R0989 Other specified symptoms and signs involving the circulatory and respiratory systems: Secondary | ICD-10-CM | POA: Diagnosis not present

## 2023-04-29 DIAGNOSIS — Z923 Personal history of irradiation: Secondary | ICD-10-CM

## 2023-04-29 DIAGNOSIS — N1330 Unspecified hydronephrosis: Secondary | ICD-10-CM | POA: Diagnosis not present

## 2023-04-29 DIAGNOSIS — I6523 Occlusion and stenosis of bilateral carotid arteries: Secondary | ICD-10-CM | POA: Diagnosis not present

## 2023-04-29 DIAGNOSIS — G9340 Encephalopathy, unspecified: Secondary | ICD-10-CM | POA: Diagnosis not present

## 2023-04-29 DIAGNOSIS — G9341 Metabolic encephalopathy: Secondary | ICD-10-CM | POA: Diagnosis not present

## 2023-04-29 DIAGNOSIS — E1140 Type 2 diabetes mellitus with diabetic neuropathy, unspecified: Secondary | ICD-10-CM | POA: Diagnosis not present

## 2023-04-29 DIAGNOSIS — R140 Abdominal distension (gaseous): Secondary | ICD-10-CM | POA: Diagnosis not present

## 2023-04-29 DIAGNOSIS — Z6824 Body mass index (BMI) 24.0-24.9, adult: Secondary | ICD-10-CM

## 2023-04-29 DIAGNOSIS — A419 Sepsis, unspecified organism: Principal | ICD-10-CM | POA: Diagnosis present

## 2023-04-29 DIAGNOSIS — Z9842 Cataract extraction status, left eye: Secondary | ICD-10-CM

## 2023-04-29 DIAGNOSIS — Z8249 Family history of ischemic heart disease and other diseases of the circulatory system: Secondary | ICD-10-CM

## 2023-04-29 DIAGNOSIS — R262 Difficulty in walking, not elsewhere classified: Secondary | ICD-10-CM | POA: Diagnosis present

## 2023-04-29 DIAGNOSIS — I9581 Postprocedural hypotension: Secondary | ICD-10-CM | POA: Diagnosis not present

## 2023-04-29 DIAGNOSIS — Z833 Family history of diabetes mellitus: Secondary | ICD-10-CM

## 2023-04-29 DIAGNOSIS — C760 Malignant neoplasm of head, face and neck: Secondary | ICD-10-CM | POA: Diagnosis not present

## 2023-04-29 DIAGNOSIS — I10 Essential (primary) hypertension: Secondary | ICD-10-CM | POA: Diagnosis not present

## 2023-04-29 DIAGNOSIS — Z9079 Acquired absence of other genital organ(s): Secondary | ICD-10-CM

## 2023-04-29 DIAGNOSIS — I951 Orthostatic hypotension: Secondary | ICD-10-CM | POA: Diagnosis present

## 2023-04-29 DIAGNOSIS — N136 Pyonephrosis: Secondary | ICD-10-CM | POA: Diagnosis not present

## 2023-04-29 DIAGNOSIS — I120 Hypertensive chronic kidney disease with stage 5 chronic kidney disease or end stage renal disease: Secondary | ICD-10-CM | POA: Diagnosis not present

## 2023-04-29 DIAGNOSIS — Z79899 Other long term (current) drug therapy: Secondary | ICD-10-CM

## 2023-04-29 DIAGNOSIS — B965 Pseudomonas (aeruginosa) (mallei) (pseudomallei) as the cause of diseases classified elsewhere: Secondary | ICD-10-CM | POA: Diagnosis present

## 2023-04-29 DIAGNOSIS — E872 Acidosis, unspecified: Secondary | ICD-10-CM | POA: Diagnosis not present

## 2023-04-29 DIAGNOSIS — R339 Retention of urine, unspecified: Secondary | ICD-10-CM | POA: Diagnosis not present

## 2023-04-29 DIAGNOSIS — N400 Enlarged prostate without lower urinary tract symptoms: Secondary | ICD-10-CM | POA: Diagnosis present

## 2023-04-29 DIAGNOSIS — R627 Adult failure to thrive: Secondary | ICD-10-CM | POA: Diagnosis not present

## 2023-04-29 DIAGNOSIS — L89152 Pressure ulcer of sacral region, stage 2: Secondary | ICD-10-CM | POA: Diagnosis present

## 2023-04-29 DIAGNOSIS — Z886 Allergy status to analgesic agent status: Secondary | ICD-10-CM

## 2023-04-29 DIAGNOSIS — Z7401 Bed confinement status: Secondary | ICD-10-CM

## 2023-04-29 DIAGNOSIS — Z885 Allergy status to narcotic agent status: Secondary | ICD-10-CM

## 2023-04-29 DIAGNOSIS — Z992 Dependence on renal dialysis: Secondary | ICD-10-CM | POA: Diagnosis not present

## 2023-04-29 DIAGNOSIS — L98429 Non-pressure chronic ulcer of back with unspecified severity: Secondary | ICD-10-CM | POA: Diagnosis present

## 2023-04-29 DIAGNOSIS — H9202 Otalgia, left ear: Secondary | ICD-10-CM | POA: Diagnosis present

## 2023-04-29 DIAGNOSIS — R6889 Other general symptoms and signs: Secondary | ICD-10-CM | POA: Diagnosis not present

## 2023-04-29 DIAGNOSIS — D631 Anemia in chronic kidney disease: Secondary | ICD-10-CM | POA: Diagnosis not present

## 2023-04-29 DIAGNOSIS — M799 Soft tissue disorder, unspecified: Secondary | ICD-10-CM | POA: Diagnosis not present

## 2023-04-29 DIAGNOSIS — E871 Hypo-osmolality and hyponatremia: Secondary | ICD-10-CM | POA: Diagnosis not present

## 2023-04-29 DIAGNOSIS — I2789 Other specified pulmonary heart diseases: Secondary | ICD-10-CM | POA: Diagnosis not present

## 2023-04-29 DIAGNOSIS — N179 Acute kidney failure, unspecified: Principal | ICD-10-CM | POA: Diagnosis present

## 2023-04-29 DIAGNOSIS — Z87891 Personal history of nicotine dependence: Secondary | ICD-10-CM

## 2023-04-29 DIAGNOSIS — R6521 Severe sepsis with septic shock: Secondary | ICD-10-CM | POA: Diagnosis present

## 2023-04-29 DIAGNOSIS — Z801 Family history of malignant neoplasm of trachea, bronchus and lung: Secondary | ICD-10-CM

## 2023-04-29 DIAGNOSIS — Z789 Other specified health status: Secondary | ICD-10-CM

## 2023-04-29 DIAGNOSIS — L299 Pruritus, unspecified: Secondary | ICD-10-CM | POA: Diagnosis present

## 2023-04-29 DIAGNOSIS — N185 Chronic kidney disease, stage 5: Secondary | ICD-10-CM | POA: Diagnosis not present

## 2023-04-29 DIAGNOSIS — R5381 Other malaise: Secondary | ICD-10-CM | POA: Diagnosis present

## 2023-04-29 DIAGNOSIS — R531 Weakness: Secondary | ICD-10-CM | POA: Diagnosis not present

## 2023-04-29 DIAGNOSIS — Z743 Need for continuous supervision: Secondary | ICD-10-CM | POA: Diagnosis not present

## 2023-04-29 HISTORY — DX: Hyperkalemia: E87.5

## 2023-04-29 LAB — APTT: aPTT: 27 s (ref 24–36)

## 2023-04-29 LAB — BASIC METABOLIC PANEL
Anion gap: 21 — ABNORMAL HIGH (ref 5–15)
BUN: 260 mg/dL — ABNORMAL HIGH (ref 8–23)
CO2: 10 mmol/L — ABNORMAL LOW (ref 22–32)
Calcium: 6.7 mg/dL — ABNORMAL LOW (ref 8.9–10.3)
Chloride: 95 mmol/L — ABNORMAL LOW (ref 98–111)
Creatinine, Ser: 30.39 mg/dL — ABNORMAL HIGH (ref 0.61–1.24)
GFR, Estimated: 1 mL/min — ABNORMAL LOW (ref >60)
Glucose, Bld: 170 mg/dL — ABNORMAL HIGH (ref 70–99)
Potassium: >7.5 mmol/L (ref 3.5–5.1)
Sodium: 126 mmol/L — ABNORMAL LOW (ref 135–145)

## 2023-04-29 LAB — CBC
HCT: 28.6 % — ABNORMAL LOW (ref 39.0–52.0)
Hemoglobin: 9.5 g/dL — ABNORMAL LOW (ref 13.0–17.0)
MCH: 30 pg (ref 26.0–34.0)
MCHC: 33.2 g/dL (ref 30.0–36.0)
MCV: 90.2 fL (ref 80.0–100.0)
Platelets: 162 10*3/uL (ref 150–400)
RBC: 3.17 MIL/uL — ABNORMAL LOW (ref 4.22–5.81)
RDW: 13 % (ref 11.5–15.5)
WBC: 8.2 10*3/uL (ref 4.0–10.5)
nRBC: 0 % (ref 0.0–0.2)

## 2023-04-29 LAB — MRSA NEXT GEN BY PCR, NASAL: MRSA by PCR Next Gen: NOT DETECTED

## 2023-04-29 LAB — TROPONIN I (HIGH SENSITIVITY): Troponin I (High Sensitivity): 6 ng/L (ref <18)

## 2023-04-29 LAB — CBG MONITORING, ED
Glucose-Capillary: 164 mg/dL — ABNORMAL HIGH (ref 70–99)
Glucose-Capillary: 216 mg/dL — ABNORMAL HIGH (ref 70–99)

## 2023-04-29 LAB — GLUCOSE, RANDOM: Glucose, Bld: 280 mg/dL — ABNORMAL HIGH (ref 70–99)

## 2023-04-29 LAB — GLUCOSE, CAPILLARY: Glucose-Capillary: 159 mg/dL — ABNORMAL HIGH (ref 70–99)

## 2023-04-29 LAB — HEPATITIS B SURFACE ANTIGEN: Hepatitis B Surface Ag: NONREACTIVE

## 2023-04-29 LAB — PROTIME-INR
INR: 1.3 — ABNORMAL HIGH (ref 0.8–1.2)
Prothrombin Time: 16.7 s — ABNORMAL HIGH (ref 11.4–15.2)

## 2023-04-29 MED ORDER — ALBUTEROL SULFATE (2.5 MG/3ML) 0.083% IN NEBU
10.0000 mg | INHALATION_SOLUTION | Freq: Once | RESPIRATORY_TRACT | Status: AC
  Administered 2023-04-29: 10 mg via RESPIRATORY_TRACT
  Filled 2023-04-29: qty 12

## 2023-04-29 MED ORDER — DEXTROSE 10 % IV SOLN: Freq: Once | INTRAVENOUS | Status: AC

## 2023-04-29 MED ORDER — SODIUM ZIRCONIUM CYCLOSILICATE 5 G PO PACK
5.0000 g | PACK | Freq: Once | ORAL | Status: AC
  Administered 2023-04-29: 5 g via ORAL
  Filled 2023-04-29: qty 1

## 2023-04-29 MED ORDER — DEXTROSE 50 % IV SOLN
1.0000 | Freq: Once | INTRAVENOUS | Status: AC
  Administered 2023-04-29: 50 mL via INTRAVENOUS
  Filled 2023-04-29: qty 50

## 2023-04-29 MED ORDER — CALCIUM GLUCONATE 10 % IV SOLN
1.0000 g | Freq: Once | INTRAVENOUS | Status: AC
  Administered 2023-04-29: 1 g via INTRAVENOUS
  Filled 2023-04-29: qty 10

## 2023-04-29 MED ORDER — INSULIN ASPART 100 UNIT/ML IV SOLN
5.0000 [IU] | Freq: Once | INTRAVENOUS | Status: AC
  Administered 2023-04-29: 5 [IU] via INTRAVENOUS

## 2023-04-29 MED ORDER — PATIROMER SORBITEX CALCIUM 8.4 G PO PACK
1.0000 | PACK | Freq: Every day | ORAL | Status: DC
  Filled 2023-04-29: qty 1

## 2023-04-29 MED ORDER — CHLORHEXIDINE GLUCONATE CLOTH 2 % EX PADS
6.0000 | MEDICATED_PAD | Freq: Every day | CUTANEOUS | Status: DC
  Administered 2023-04-30 – 2023-05-09 (×9): 6 via TOPICAL

## 2023-04-29 MED ORDER — GABAPENTIN 300 MG PO CAPS
300.0000 mg | ORAL_CAPSULE | Freq: Every day | ORAL | Status: DC
  Administered 2023-04-29 – 2023-06-12 (×45): 300 mg via ORAL
  Filled 2023-04-29 (×20): qty 1
  Filled 2023-04-29: qty 3
  Filled 2023-04-29 (×24): qty 1

## 2023-04-29 MED ORDER — SODIUM BICARBONATE 650 MG PO TABS
650.0000 mg | ORAL_TABLET | Freq: Two times a day (BID) | ORAL | Status: DC
  Administered 2023-04-29 – 2023-05-02 (×7): 650 mg via ORAL
  Filled 2023-04-29 (×7): qty 1

## 2023-04-29 MED ORDER — SODIUM BICARBONATE 8.4 % IV SOLN
50.0000 meq | Freq: Once | INTRAVENOUS | Status: AC
  Administered 2023-04-29: 50 meq via INTRAVENOUS
  Filled 2023-04-29: qty 50

## 2023-04-29 MED ORDER — ACETAMINOPHEN 500 MG PO TABS
1000.0000 mg | ORAL_TABLET | Freq: Four times a day (QID) | ORAL | Status: DC | PRN
  Administered 2023-04-29 – 2023-05-03 (×9): 1000 mg via ORAL
  Filled 2023-04-29 (×10): qty 2

## 2023-04-29 MED ORDER — LOPERAMIDE HCL 2 MG PO CAPS
2.0000 mg | ORAL_CAPSULE | Freq: Four times a day (QID) | ORAL | Status: DC | PRN
  Administered 2023-05-02 – 2023-05-06 (×4): 2 mg via ORAL
  Filled 2023-04-29 (×5): qty 1

## 2023-04-29 MED ORDER — PANTOPRAZOLE SODIUM 40 MG PO TBEC
40.0000 mg | DELAYED_RELEASE_TABLET | Freq: Every day | ORAL | Status: DC
  Administered 2023-04-30 – 2023-06-11 (×41): 40 mg via ORAL
  Filled 2023-04-29 (×41): qty 1

## 2023-04-29 MED ORDER — TAMSULOSIN HCL 0.4 MG PO CAPS
0.4000 mg | ORAL_CAPSULE | Freq: Every day | ORAL | Status: DC
  Administered 2023-04-30 – 2023-06-12 (×42): 0.4 mg via ORAL
  Filled 2023-04-29 (×41): qty 1

## 2023-04-29 MED ORDER — SODIUM CHLORIDE 0.9 % IV BOLUS: 500.0000 mL | Freq: Once | INTRAVENOUS | Status: DC

## 2023-04-29 MED ORDER — ONDANSETRON HCL 4 MG/2ML IJ SOLN
4.0000 mg | Freq: Four times a day (QID) | INTRAMUSCULAR | Status: AC | PRN
Start: 2023-04-29 — End: 2023-04-30
  Administered 2023-04-29 – 2023-04-30 (×2): 4 mg via INTRAVENOUS
  Filled 2023-04-29 (×2): qty 2

## 2023-04-29 MED ORDER — SODIUM CHLORIDE 0.9 % IV BOLUS
1000.0000 mL | Freq: Once | INTRAVENOUS | Status: AC
  Administered 2023-04-29: 1000 mL via INTRAVENOUS

## 2023-04-29 MED ORDER — FUROSEMIDE 10 MG/ML IJ SOLN
60.0000 mg | Freq: Once | INTRAMUSCULAR | Status: AC
  Administered 2023-04-29: 60 mg via INTRAVENOUS
  Filled 2023-04-29: qty 6

## 2023-04-29 MED ORDER — ONDANSETRON HCL 4 MG/2ML IJ SOLN
4.0000 mg | Freq: Once | INTRAMUSCULAR | Status: AC
  Administered 2023-04-29: 4 mg via INTRAVENOUS
  Filled 2023-04-29: qty 2

## 2023-04-29 MED ORDER — VITAMIN D 25 MCG (1000 UNIT) PO TABS
5000.0000 [IU] | ORAL_TABLET | ORAL | Status: DC
  Administered 2023-04-30 – 2023-06-11 (×12): 5000 [IU] via ORAL
  Filled 2023-04-29 (×19): qty 5

## 2023-04-29 MED ORDER — DOCUSATE SODIUM 100 MG PO CAPS
100.0000 mg | ORAL_CAPSULE | Freq: Two times a day (BID) | ORAL | Status: DC | PRN
  Filled 2023-04-29: qty 1

## 2023-04-29 MED ORDER — INSULIN ASPART 100 UNIT/ML IJ SOLN
0.0000 [IU] | Freq: Three times a day (TID) | INTRAMUSCULAR | Status: DC
Start: 2023-04-30 — End: 2023-04-30

## 2023-04-29 MED ORDER — POLYETHYLENE GLYCOL 3350 17 G PO PACK
17.0000 g | PACK | Freq: Every day | ORAL | Status: DC | PRN
  Filled 2023-04-29: qty 1

## 2023-04-29 NOTE — ED Provider Notes (Signed)
Louis Butler Provider Note   CSN: [id number] Arrival date & time: 04/29/23  1740     History  Chief Complaint  Patient presents with   Weakness    Louis Butler is a 68 y.o. male with PMH as listed below who presents BIB EMS from home accompanied by family who provides additional history.  Patient has had nausea and vomiting with inability to take p.o. for several days up to 1 week.  Family reports he has not complained of any abdominal pain or fever/chills but just has not been able to eat anything.  Also was not having any bowel movements or making any urine.  He does have a history of CKD and required dialysis for a short period of time but otherwise has refused it and is not currently on dialysis.  Patient has significant generalized weakness and wife states that she was not going to be able to physically get him to the emergency department today so had to call EMS.  He does have an old left upper extremity fistula.  He at this time denies any chest pain or shortness of breath but endorses mild left side pain or left upper quadrant pain that has been intermittent he states.  Patient is sleepy on exam with eyes closed but is responsive to voice, does answer questions appropriately but at times seems mildly confused.   20g RFA  4mg  zofran  108/58 Pulse 68 94% RA Cbg 212  Past Medical History:  Diagnosis Date   Arthritis    Cellulitis 03/14/2019   feet   CKD (chronic kidney disease)    Diabetes mellitus without complication (HCC)    Type II   End-stage kidney disease (HCC) 03/14/2019   was on hemodialysis ~ June 2021 - March 2022 when he discontinued; followed by Dr. Zetta Bills   GERD (gastroesophageal reflux disease)    Heart murmur    History of blood transfusion    Hypertension    12/30/19- having low blood pressure since beginning   Irregular heart beat    UTI (urinary tract infection)        Home Medications Prior to  Admission medications   Medication Sig Start Date End Date Taking? Authorizing Provider  acetaminophen (TYLENOL) 500 MG tablet Take 1,000 mg by mouth every 6 (six) hours as needed for headache (pain).     [provider]  amLODipine (NORVASC) 5 MG tablet Take 1 tablet (5 mg total) by mouth at bedtime. 10/22/22   Latrelle Dodrill, MD  blood glucose meter kit and supplies KIT Dispense based on patient and insurance preference. Use up to four times daily as directed. (FOR ICD-9 250.00, 250.01). 03/28/19   Hongalgi, Maximino Greenland, MD  carvedilol (COREG) 25 MG tablet Take 25 mg by mouth 2 (two) times daily with a meal.    [provider]  Cholecalciferol (VITAMIN D3) 125 MCG (5000 UT) CAPS Take 5,000 Units by mouth 2 (two) times a week.    [provider]  esomeprazole (NEXIUM) 40 MG capsule TAKE 1 CAPSULE BY MOUTH ONCE DAILY AT  NOON Patient taking differently: Take 40 mg by mouth daily as needed. 08/23/21   Mansouraty, Netty Starring., MD  famotidine (PEPCID) 20 MG tablet Take one tablet by mouth daily 01/02/23   Latrelle Dodrill, MD  gabapentin (NEURONTIN) 300 MG capsule Take 1 capsule (300 mg total) by mouth at bedtime. 01/12/23   Latrelle Dodrill, MD  hydrocortisone 2.5 %  cream Apply to affected area twice daily as needed 01/21/23   Latrelle Dodrill, MD  Insulin Syringes, Disposable, U-100 0.5 ML MISC Use as per instructions 3 times daily AC. 03/28/19   Hongalgi, Maximino Greenland, MD  LANTUS SOLOSTAR 100 UNIT/ML Solostar Pen Inject 6 units subcutaneously once daily 01/21/23   Latrelle Dodrill, MD  loperamide (IMODIUM A-D) 2 MG tablet Take 1 tablet (2 mg total) by mouth 4 (four) times daily as needed for diarrhea or loose stools. 06/12/20   Latrelle Dodrill, MD  sodium bicarbonate 650 MG tablet Take 650 mg by mouth 2 (two) times daily.    [provider]  tamsulosin (FLOMAX) 0.4 MG CAPS capsule Take 0.4 mg by mouth daily.    [provider]  traMADol  (ULTRAM) 50 MG tablet Take 1 tablet (50 mg total) by mouth every 6 (six) hours as needed. 02/26/22   Latrelle Dodrill, MD  trimethoprim (TRIMPEX) 100 MG tablet Take 100 mg by mouth daily. 01/08/22   [provider]  VELTASSA 8.4 g packet Take 1 packet by mouth daily.    [provider]      Allergies    Hydrocodone and Percocet [oxycodone-acetaminophen]    Review of Systems   Review of Systems A 10 point review of systems was performed and is negative unless otherwise reported in HPI.  Physical Exam Updated Vital Signs BP (!) 105/49   Pulse 71   Temp (!) 97 F (36.1 C) (Rectal)   Resp 14   Ht 6\' 3"  (1.905 m)   Wt 93 kg   SpO2 100%   BMI 25.63 kg/m  Physical Exam General: acutely ill-appearing male, lying in bed.  HEENT: PERRLA, Sclera anicteric, very dry mucous membranes, trachea midline.  Cardiology: RRR, no murmurs/rubs/gallops. BL radial and DP pulses equal bilaterally.  Resp: Normal respiratory rate and effort. CTAB, no wheezes, rhonchi, crackles.  Abd: Soft, non-tender, non-distended. No rebound tenderness or guarding.  GU: Deferred. MSK: No peripheral edema or signs of trauma. Extremities without deformity or TTP.  Skin: cool, dry. Poor skin turgor.  Back: No CVA tenderness Neuro: Mildly somnolent, responds to questions and verbal stimuli, answers questions appropriately but at times seems confused, CNs II-XII grossly intact. MAEs. Sensation grossly intact.   ED Results / Procedures / Treatments   Labs (all labs ordered are listed, but only abnormal results are displayed) Labs Reviewed  BASIC METABOLIC PANEL - Abnormal; Notable for the following components:      Result Value   Sodium 126 (*)    Potassium >7.5 (*)    Chloride 95 (*)    CO2 10 (*)    Glucose, Bld 170 (*)    BUN 260 (*)    Creatinine, Ser 30.39 (*)    Calcium 6.7 (*)    GFR, Estimated 1 (*)    Anion gap 21 (*)    All other components within normal limits  CBC - Abnormal;  Notable for the following components:   RBC 3.17 (*)    Hemoglobin 9.5 (*)    HCT 28.6 (*)    All other components within normal limits  CBG MONITORING, ED - Abnormal; Notable for the following components:   Glucose-Capillary 164 (*)    All other components within normal limits  URINALYSIS, ROUTINE W REFLEX MICROSCOPIC  GLUCOSE, RANDOM  CBG MONITORING, ED  I-STAT CHEM 8, ED  TROPONIN I (HIGH SENSITIVITY)    EKG EKG Interpretation Date/Time:  Wednesday April 29 2023  17:45:38 EST Ventricular Rate:  70 PR Interval:    QRS Duration:  123 QT Interval:  427 QTC Calculation: 461 R Axis:   -52  Text Interpretation: Sinus rhythm with first degree AV block Nonspecific IVCD with LAD Inferior infarct, old Consider anterior infarct Consider peaked T waves Confirmed by Vivi Barrack [phone]) on 04/29/2023 5:58:06 PM  Radiology   Procedures .Critical Care  Performed by: Loetta Rough, MD Authorized by: Loetta Rough, MD   Critical care provider statement:    Critical care time (minutes):  60   Critical care was necessary to treat or prevent imminent or life-threatening deterioration of the following conditions:  Renal failure, metabolic crisis and CNS failure or compromise   Critical care was time spent personally by me on the following activities:  Development of treatment plan with patient or surrogate, discussions with consultants, evaluation of patient's response to treatment, examination of patient, ordering and review of laboratory studies, ordering and review of radiographic studies, ordering and performing treatments and interventions, pulse oximetry, re-evaluation of patient's condition, review of old charts and obtaining history from patient or surrogate   Care discussed with: admitting provider       Medications Ordered in ED Medications  dextrose 10 % infusion (has no administration in time range)  insulin aspart (novoLOG) injection 5 Units (has no administration in  time range)    And  dextrose 50 % solution 50 mL (has no administration in time range)  sodium bicarbonate injection 50 mEq (has no administration in time range)  calcium gluconate inj 10% (1 g) URGENT USE ONLY! (has no administration in time range)  sodium chloride 0.9 % bolus 1,000 mL (has no administration in time range)  furosemide (LASIX) injection 60 mg (has no administration in time range)  calcium gluconate inj 10% (1 g) URGENT USE ONLY! (1 g Intravenous Given 04/29/23 1833)  albuterol (PROVENTIL) (2.5 MG/3ML) 0.083% nebulizer solution 10 mg (10 mg Nebulization Given 04/29/23 1907)    ED Course/ Medical Decision Making/ A&P                          Medical Decision Making Amount and/or Complexity of Data Reviewed Labs: ordered. Decision-making details documented in ED Course. Radiology: ordered.  Risk OTC drugs. Prescription drug management. Decision regarding hospitalization.    This patient presents to the ED for concern of gen weakness, GI losses, this involves an extensive number of treatment options, and is a complaint that carries with it a high risk of complications and morbidity.  I considered the following differential and admission for this acute, potentially life threatening condition.   MDM:    DDX for generalized weakness includes but is not limited to:  Patient seems to be dealing with a GI illness with nausea vomiting diarrhea and decreased p.o. intake, as well as severe generalized weakness and subacute FTT.  Infectious processes, severe metabolic derangements or electrolyte abnormalities, ischemia/ACS, heart failure, anemia. Also consider intracranial/central processes but think these are unlikely given the history and physical exam.    Clinical Course as of 04/29/23 1909  Wed Apr 29, 2023  1758 Patient with possible peaked T waves on his EKG.  Does have history of ESRD not currently on hemodialysis, also has history of hyperkalemia, will give calcium  gluconate urgent push while awaiting electrolytes to result [HN]  1842 Hemoglobin(!): 9.5 Drop from 11.3 three motnhs ago [HN]  1845 Potassium(!!): >7.5 Already given calcium, will initiate  remainder of treatment for hyperkalemia and call nephrology emergently [HN]  1846 Creatinine(!): 30.39 [HN]  1846 BUN(!): 260 [HN]  1846 Sodium(!): 126 Hyponatremia, giving NS bolus [HN]  1902 D/w Nephrology Dr. Glenna Fellows who will come to see the patient, recommends CRRT in the ICU. Consulted to intensivist.   Discussed with patient and his family.  Patient has refused dialysis in the past and his wife relates that he has been very "hard headed" about it.  I discussed with the patient, his wife, and his daughter and son who agreed to CRRT at this moment understanding that it may not be permanent and can reevaluate after several days. Initiated fluids, lasix, albuterol, insulin/dextrose, bicarb, and repeat calcium gluconate dose for patient's hyperkalemia.  He is monitored on telemetry currently in normal sinus rhythm. [HN]  1908 Admitted to ICU [HN]    Clinical Course User Index [HN] Loetta Rough, MD    Labs: I Ordered, and personally interpreted labs.  The pertinent results include:  those listed above  Imaging Studies ordered: I ordered imaging studies including CT abd pelvis wo contrast I independently visualized and interpreted imaging. I agree with the radiologist interpretation  Additional history obtained from chart review, family at bedside.    Cardiac Monitoring: The patient was maintained on a cardiac monitor.  I personally viewed and interpreted the cardiac monitored which showed an underlying rhythm of: NSR w/ peaked T waves  Reevaluation: After the interventions noted above, I reevaluated the patient and found that they have :improved  Social Determinants of Health: Lives independently  Disposition:  Admit to ICU w/ nephrology following  Co morbidities that complicate the  patient evaluation  Past Medical History:  Diagnosis Date   Arthritis    Cellulitis 03/14/2019   feet   CKD (chronic kidney disease)    Diabetes mellitus without complication (HCC)    Type II   End-stage kidney disease (HCC) 03/14/2019   was on hemodialysis ~ June 2021 - March 2022 when he discontinued; followed by Dr. Zetta Bills   GERD (gastroesophageal reflux disease)    Heart murmur    History of blood transfusion    Hypertension    12/30/19- having low blood pressure since beginning   Irregular heart beat    UTI (urinary tract infection)      Medicines Meds ordered this encounter  Medications   calcium gluconate inj 10% (1 g) URGENT USE ONLY!   DISCONTD: sodium chloride 0.9 % bolus 500 mL   albuterol (PROVENTIL) (2.5 MG/3ML) 0.083% nebulizer solution 10 mg   dextrose 10 % infusion   AND Linked Order Group    insulin aspart (novoLOG) injection 5 Units    dextrose 50 % solution 50 mL   sodium bicarbonate injection 50 mEq   calcium gluconate inj 10% (1 g) URGENT USE ONLY!   sodium chloride 0.9 % bolus 1,000 mL   furosemide (LASIX) injection 60 mg    I have reviewed the patients home medicines and have made adjustments as needed  Problem List / ED Course: Problem List Items Addressed This Visit   None Visit Diagnoses       Acute renal failure superimposed on chronic kidney disease, unspecified acute renal failure type, unspecified CKD stage (HCC)    -  Primary     Hyperkalemia         Hyponatremia         Severe dehydration         Uremia  This note was created using dictation software, which may contain spelling or grammatical errors.    Loetta Rough, MD 05/03/23 1200

## 2023-04-29 NOTE — Consult Note (Addendum)
[facility]  INPATIENT CONSULTATION  Reason for Consultation: hyperkalemia, uremia Requesting Provider: Dr. Jearld Fenton  HPI: Louis Butler is an 68 y.o. male with CKD 5 (f/b Dr. Ricard Dillon), DM, HTN, recent squamous cell ca H&N (surgery, XRT 2024), anemia who presented with N/V/abd pain and was found to have marked hyperkalemia and renal failure for which nephrology is consulted.   History mainly obtained from wife of 49y who is bedside and his primary caregiver.  Had advanced CKD thought ESRD and was on dialysis 2021-06/2020 when he self discontinued and has followed in clinic since.  Last OV with Dr. Allena Katz was 02/19/24 - GFR11, Hb 9.6.    Sounds like subacute decline recently - less mobile, less po intake, marked pruritus over weeks.  In the past few days po intake has declined to nothing, not even keeping medications down.  Wife was begging him for days to present to ED but he declined.  Was doing so poorly today EMS was finally called. In the ED T 97, BP 104/49, HR 70s.  Labs showing Na 126, K > 7.5, BUN 260, Cr 30, Ca 6.8, Hb 9.5, WBC 8.2, Plt 162.   Rec'd IV calcium, bicarb, lasix, NS bolus, insulin and dextrose, albuterol due to peaked T waves.  CT abd w/o contrast pending.  ICU has been called to admit patient.  Wife and patient amenable to temporary HD.  Pt unclear if he would be accepting of long term dialysis.    PMH: Past Medical History:  Diagnosis Date   Arthritis    Cellulitis 03/14/2019   feet   CKD (chronic kidney disease)    Diabetes mellitus without complication (HCC)    Type II   End-stage kidney disease (HCC) 03/14/2019   was on hemodialysis ~ June 2021 - March 2022 when he discontinued; followed by Dr. Zetta Bills   GERD (gastroesophageal reflux disease)    Heart murmur    History of blood transfusion    Hypertension    12/30/19- having low blood pressure since beginning   Irregular heart beat    UTI (urinary tract infection)    PSH: Past Surgical  History:  Procedure Laterality Date   arm surgery Right    was cut to the bone   BASCILIC VEIN TRANSPOSITION Left 11/18/2019   Procedure: FIRST STAGE LEFT ARM BASCILIC VEIN TRANSPOSITION;  Surgeon: Nada Libman, MD;  Location: [facility] OR;  Service: Vascular;  Laterality: Left;   BASCILIC VEIN TRANSPOSITION Left 01/04/2020   Procedure: LEFT SECOND STAGE BASCILIC VEIN TRANSPOSITION;  Surgeon: Nada Libman, MD;  Location: [facility] OR;  Service: Vascular;  Laterality: Left;   COLONOSCOPY  06/2019   DIRECT LARYNGOSCOPY Bilateral 08/06/2022   Procedure: DIRECT LARYNGOSCOPY WITH BIOPSY;  Surgeon: Christia Reading, MD;  Location: [facility] OR;  Service: ENT;  Laterality: Bilateral;   EYE SURGERY Bilateral    cataract   NASOPHARYNGEAL BIOPSY Left 08/06/2022   Procedure: NASOPHARYNGEAL BIOPSY;  Surgeon: Christia Reading, MD;  Location: [facility] OR;  Service: ENT;  Laterality: Left;   TONSILLECTOMY Left 08/06/2022   Procedure: TONSILLECTOMY;  Surgeon: Christia Reading, MD;  Location: [facility] OR;  Service: ENT;  Laterality: Left;   TRANSURETHRAL RESECTION OF PROSTATE N/A 08/08/2019   Procedure: TRANSURETHRAL RESECTION OF THE PROSTATE (TURP);  Surgeon: Crista Elliot, MD;  Location: [facility] ORS;  Service: Urology;  Laterality: N/A;   UPPER GASTROINTESTINAL ENDOSCOPY  06/2019   WISDOM TOOTH EXTRACTION     Past Medical History:  Diagnosis  Date   Arthritis    Cellulitis 03/14/2019   feet   CKD (chronic kidney disease)    Diabetes mellitus without complication (HCC)    Type II   End-stage kidney disease (HCC) 03/14/2019   was on hemodialysis ~ June 2021 - March 2022 when he discontinued; followed by Dr. Zetta Bills   GERD (gastroesophageal reflux disease)    Heart murmur    History of blood transfusion    Hypertension    12/30/19- having low blood pressure since beginning   Irregular heart beat    UTI (urinary tract infection)     Medications:  I have reviewed the patient's current medications.  (Not in a hospital  admission)   ALLERGIES:   Allergies  Allergen Reactions   Hydrocodone Hives and Itching   Percocet [Oxycodone-Acetaminophen] Hives and Itching    FAM HX: Family History  Problem Relation Age of Onset   Diabetes type II Mother    Lung cancer Mother    Hypertension Mother    Diabetes type II Father    Diabetes type II Sister    Diabetes type II Brother    Hypertension Brother    Other Son        burned   CAD Neg Hx    Colon cancer Neg Hx    Esophageal cancer Neg Hx    Inflammatory bowel disease Neg Hx    Liver disease Neg Hx    Pancreatic cancer Neg Hx    Rectal cancer Neg Hx    Stomach cancer Neg Hx     Social History:   reports that he quit smoking about 6 months ago. His smoking use included cigarettes. He started smoking about 54 years ago. He has a 25.6 pack-year smoking history. He has never used smokeless tobacco. He reports that he does not currently use alcohol. He reports that he does not use drugs.  ROS: 12 system ROS neg except per HPI above  Blood pressure 121/76, pulse 76, temperature (!) 97 F (36.1 C), temperature source Rectal, resp. rate 19, height 6\' 3"  (1.905 m), weight 93 kg, SpO2 100%. 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  Neuro: AOx3 but relying on wife for most details, appearing drowsy at times Skin: excoriations noted covering forearms from pruritus, none appearing infected   Results for orders placed or performed during the hospital encounter of 04/29/23 (from the past 48 hours)  CBG monitoring, ED     Status: Abnormal   Collection Time: 04/29/23  5:44 PM  Result Value Ref Range   Glucose-Capillary 164 (H)  70 - 99 mg/dL    Comment: Glucose reference range applies only to samples taken after fasting for at least 8 hours.  Basic metabolic panel     Status: Abnormal   Collection Time: 04/29/23  5:44 PM  Result Value Ref Range   Sodium 126 (L) 135 - 145 mmol/L   Potassium >7.5 (HH) 3.5 - 5.1 mmol/L    Comment: CRITICAL RESULT CALLED TO, READ BACK BY AND VERIFIED WITH P.BLANCHARD,RN @1842  04/29/2023 VANG.J REPEATED TO VERIFY    Chloride 95 (L) 98 - 111 mmol/L   CO2 10 (  L) 22 - 32 mmol/L   Glucose, Bld 170 (H) 70 - 99 mg/dL    Comment: Glucose reference range applies only to samples taken after fasting for at least 8 hours.   BUN 260 (H) 8 - 23 mg/dL   Creatinine, Ser 01.60 (H) 0.61 - 1.24 mg/dL    Comment: RESULTS CONFIRMED BY MANUAL DILUTION   Calcium 6.7 (L) 8.9 - 10.3 mg/dL   GFR, Estimated 1 (L) >60 mL/min    Comment: (NOTE) Calculated using the CKD-EPI Creatinine Equation (2021)    Anion gap 21 (H) 5 - 15    Comment: ELECTROLYTES REPEATED TO VERIFY Performed at [facility], [address], [place], [place] [address]   CBC     Status: Abnormal   Collection Time: 04/29/23  5:44 PM  Result Value Ref Range   WBC 8.2 4.0 - 10.5 K/uL   RBC 3.17 (L) 4.22 - 5.81 MIL/uL   Hemoglobin 9.5 (L) 13.0 - 17.0 g/dL   HCT 35.5 (L) 73.2 - 20.2 %   MCV 90.2 80.0 - 100.0 fL   MCH 30.0 26.0 - 34.0 pg   MCHC 33.2 30.0 - 36.0 g/dL   RDW 54.2 70.6 - 23.7 %   Platelets 162 150 - 400 K/uL   nRBC 0.0 0.0 - 0.2 %    Comment: Performed at [facility], [address] [place], [place] [address]  Troponin I (High Sensitivity)     Status: None   Collection Time: 04/29/23  6:09 PM  Result Value Ref Range   Troponin I (High Sensitivity) 6 <18 ng/L    Comment: (NOTE) Elevated high sensitivity troponin I (hsTnI) values and significant  changes across serial measurements may suggest ACS but many other  chronic and acute conditions are known to elevate hsTnI results.  Refer to the "Links"  section for chest pain algorithms and additional  guidance. Performed at [facility], [address] [place], [place] [address]   CBG monitoring, ED     Status: Abnormal   Collection Time: 04/29/23  7:36 PM  Result Value Ref Range   Glucose-Capillary 216 (H) 70 - 99 mg/dL    Comment: Glucose reference range applies only to samples taken after fasting for at least 8 hours.    No results found.  Assessment/PlanBruce L Butler is an 68 y.o. male with CKD 5 (f/b Dr. Ricard Dillon), DM, HTN, recent squamous cell ca H&N (surgery, XRT 2024), anemia who presented with N/V/abd pain and was found to have marked hyperkalemia and renal failure for which nephrology is consulted.   **CKD 5 with AKI vs progression to ESRD:  Longstanding advanced CKD, was on dialysis 2022 but stopped and has followed with Dr. Allena Katz since; most recent GFR 02/2023 11. By history seems patient may have had progression of CKD which led to his acute presentation though there is some concern for acute GI illness potentiating AKI.  Either way emergent dialysis is indicated now to which he is agreeable.  Would provide hydration, CRRT to correct K and uremia.  Once less uremic hopefully patient can express wishes should he continue to need HD long term.   Follow I/Os, BID labs, avoid nephrotoxins, dose meds for GFR < 10.   **Hyperkalemia: CRRT now; had med management in ED.   **Hyponatremia: suspect hypovolemic, IVF for now; CRRT will also help correct.   **HTN: on coreg baseline, hold in setting of marginal BP  **AGMA: in setting of severe  AKI, defer VBG to PCCM; can give bicarb pushes PRN, CRRT will correct.   **Anemia: Hb 9.5, add iron indices.  Would clarify his H&N ca status prior to ESA administration.    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 Has rec'd IV ca supplement.   Will follow, reach out with concerns.   Tyler Pita 04/29/2023, 7:49 PM  ADDENDUM:  discussed with PCCM - no dialysis  RN immediately available to do treatment, PCCM comfortable to wait and do HD when staff available given presence of functional AVF.  Will d/w HD staff to expedite as much as possible.  If clinical status change dictates CRRT urgently indicated plan is PCCM to place line and inform me so I can order CRRT.

## 2023-04-29 NOTE — ED Triage Notes (Signed)
BIB EMS from home with complaints of weakness and nausea. Has not eaten in 1 week. LUQ pain started today. Pcp was contacted and advised to come to ER.    20g RFA  4mg  zofran  108/58 Pulse 68 94% RA Cbg 212

## 2023-04-29 NOTE — Telephone Encounter (Signed)
**  After Hours/ Emergency Line Call**  Received a page to call [phone]) - [phone].  Patient: Louis Butler  Caller:  Spouse Rhonda Confirmed name & DOB of patient with caller  Subjective:  Sick for 2 weeks. Not eating or drinking multiple days now. Intractable vomiting, not taking medications. Pruritic all over. No fevers. No sick contacts. Spouse reports he is confused and off balance.  PMHx: HTN, Diabetes, ESRD (not on dialysis),   Objective:  Observations: NAD,   Assessment & Plan  TADAN JANICKI is a 68 y.o. male with PMHx s/f HTN, Diabetes, ESRD (not on dialysis). Spouse calls with concern that patient is ill and confused, not ambulating and not able to keep fluids or medications down for several days now. Recommendations:  Recommend spouse call 911 for EMS transport to the ED for evaluation. Inpatient team is aware of patient.  -- Red flags discussed.   -- Will forward to PCP.  Tiffany Kocher, DO [facility], PGY-1

## 2023-04-29 NOTE — H&P (Signed)
NAME:  Louis Butler, MRN:  [id number], DOB:  [birth year], LOS: 0 ADMISSION DATE:  04/29/2023, CONSULTATION DATE:  04/29/2023 REFERRING MD:  Louis Butler, EDP, CHIEF COMPLAINT:  renal failure    History of Present Illness:  68 year old male with past medical history of ESRD (discontinued HD 06/2020, followed by Dr. 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   Patient is encephalopathic and history obtained from chart review and discussion with wife and multiple other family members (at bedside). Reported 1 week of nausea, vomiting, decreased PO intake. Additionally, symptoms of uremic Plt dysfunction noted with confusion/AMS, epistaxis and hematuria. Labs notable for Na 126, K >7.5, BUN/sCr 260/30.39, AG 21, Hgb 9.5. EKG with peaked T waves and was temporized with calcium gluconate, bicarb, insulin/dextrose, Lasix. Nephrology was consulted and recommended RRT, likely CRRT.  PCCM consulted for admission.   Pertinent  Medical History  ESRD (discontinued HD 06/2020, followed by Dr. Allena Butler), GERD, T2DM, HTN  Significant Hospital Events: Including procedures, antibiotic start and stop dates in addition to other pertinent events   1/22: Admit to ICU for renal failure, emergent HD vs CRRT  Interim History / Subjective:  PCCM consulted for ICU admission.  Objective   Blood pressure (!) 105/49, pulse 72, temperature (!) 97 F (36.1 C), temperature source Rectal, resp. rate 16, height 6\' 3"  (1.905 m), weight 93 kg, SpO2 100%.       No intake or output data in the 24 hours ending 04/29/23 1914 Filed Weights   04/29/23 1745  Weight: 93 kg   Physical Examination: General: Acute-on-chronically ill-appearing older man in NAD. HEENT: [place]/AT, anicteric sclera, pupils equal and round 4mm, moist mucous membranes. L ear canal with +flap s/p resection of SCC. Neuro: Awake, variable orientation but seems oriented to self/place/situation,  intermittent confusion. Responds to verbal stimuli. Following commands consistently. Moves all 4 extremities spontaneously. Generalized weakness.  CV: RRR, no m/g/r. PULM: Breathing even and unlabored on RA. Lung fields grossly clear in upper fields. GI: Soft, nontender, nondistended. Normoactive bowel sounds. Extremities: No significant LE edema noted. LUE AVF with +thrill. Skin: Warm/dry, no rashes.  Resolved Hospital Problem List:     Assessment & Plan:  Acute-on-chronic renal failure; baseline sCr ~3.8 (10/2022).  Discontinued dialysis 06/2020. Old LUE fistula, followed by Dr. Allena Butler ([facility]). BUN 260, Cr 30.39. K > 7.5, temporized with hyperkalemia protocol. Encephalopathic.  Progression to ESRD Hyperkalemia Uremia - Nephrology consulted, following; appreciate recommendations - Needs RRT; will attempt HD if BP tolerates/patient remains hemodynamically stable - If worsening clinical status or hypotension, will proceed with Trialysis catheter placement/CRRT initiation - Trend BMP - S/p shifting of K with hyperK protocol - Replete electrolytes as indicated - Monitor I&Os - Avoid nephrotoxic agents as able - Ensure adequate renal perfusion - CT A/P ordered by EDP, r/o structural component of renal failure  Acute metabolic encephalopathy in the setting of uremia - HD versus CRRT for correction of metabolic derangements - Presume this will improve as labs downtrend  Diabetes  - Basal glargine HELD for now while NPO - SSI - CBGs Q4H - Goal CBG 140-180  Hypertension  Home medications: amlodipine, carvedilol. 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 - Outpatient follow up with [facility]  GOC Given patient's degree of critical illness in the setting of SCC and now ESRD, ongoing GOC discussions would be warranted for Louis Butler. We discussed that reinitiation of dialysis would likely be a long-term necessity and not just a temporizing measure; we want to ensure that patient understands this as he makes ongoing care decisions. Will readdress GOC/patient's wishes after initial dialysis for clearance/improvement in encephalopathy. - Remains FULL CODE at present   Best Practice (right click and "Reselect all SmartList Selections" daily)   Diet/type: NPO DVT prophylaxis: SCDs Pressure ulcer(s): None POA GI prophylaxis: PPI Lines: N/A Foley:  N/A Code Status:  full code Last date of multidisciplinary goals of care discussion [Pending - see above]  Labs   CBC: Recent Labs  Lab 04/29/23 1744  WBC 8.2  HGB 9.5*  HCT 28.6*  MCV 90.2  PLT 162   Basic Metabolic Panel: Recent Labs  Lab 04/29/23 1744  NA 126*  K >7.5*  CL 95*  CO2 10*  GLUCOSE 170*  BUN 260*  CREATININE 30.39*  CALCIUM 6.7*   GFR: Estimated Creatinine Clearance: 2.8 mL/min (A) (by C-G formula based on SCr of 30.39 mg/dL (H)). Recent Labs  Lab 04/29/23 1744  WBC 8.2   Liver Function Tests: No results for input(s): "AST", "ALT", "ALKPHOS", "BILITOT", "PROT", "ALBUMIN" in the last 168 hours. No results for input(s): "LIPASE", "AMYLASE" in the last 168 hours. No results for input(s): "AMMONIA" in the last 168 hours.  ABG:    Component Value Date/Time   TCO2 28 01/04/2020 0805    Coagulation Profile: No results for input(s): "INR", "PROTIME" in the last 168 hours.  Cardiac Enzymes: No results for input(s): "CKTOTAL", "CKMB", "CKMBINDEX", "TROPONINI" in the last 168 hours.  HbA1C: Hemoglobin A1C  Date/Time Value Ref Range Status   06/29/2020 12:00 AM 7.0  Final   HbA1c, POC (prediabetic range)  Date/Time Value Ref Range Status  10/22/2022 01:57 PM 6.3 5.7 - 6.4 % Final   HbA1c, POC (controlled diabetic range)  Date/Time Value Ref Range Status  05/01/2022 10:18 AM 6.6 0.0 - 7.0 % Final  01/30/2022 09:39 AM 6.3 0.0 - 7.0 % Final   CBG: Recent Labs  Lab 04/29/23 1744  GLUCAP 164*   Review of Systems:   Review of Systems  Constitutional:  Positive for chills and malaise/fatigue. Negative for fever.  HENT:  Positive for congestion, ear pain, hearing loss and nosebleeds. Negative for sore throat.   Eyes:  Negative for blurred vision and double vision.  Respiratory:  Positive for shortness of breath. Negative for cough and hemoptysis.   Cardiovascular:  Negative for chest pain, palpitations and leg swelling.  Gastrointestinal:  Positive for nausea and vomiting. Negative for abdominal pain, blood in stool and diarrhea.  Genitourinary:  Positive for hematuria.  Neurological:  Positive for weakness. Negative for headaches.  Endo/Heme/Allergies:  Bruises/bleeds easily.   Past Medical History:  He,  has a past medical history of Arthritis, Cellulitis (03/14/2019), CKD (chronic kidney disease), Diabetes mellitus without complication (HCC), End-stage kidney disease (HCC) (03/14/2019), GERD (gastroesophageal reflux disease), Heart murmur, History of blood transfusion, Hypertension, Irregular heart beat, and UTI (urinary tract infection).   Surgical History:   Past Surgical History:  Procedure Laterality Date   arm surgery Right  was cut to the bone   BASCILIC VEIN TRANSPOSITION Left 11/18/2019   Procedure: FIRST STAGE LEFT ARM BASCILIC VEIN TRANSPOSITION;  Surgeon: Nada Libman, MD;  Location: [facility] OR;  Service: Vascular;  Laterality: Left;   BASCILIC VEIN TRANSPOSITION Left 01/04/2020   Procedure: LEFT SECOND STAGE BASCILIC VEIN TRANSPOSITION;  Surgeon: Nada Libman, MD;  Location: [facility] OR;  Service: Vascular;   Laterality: Left;   COLONOSCOPY  06/2019   DIRECT LARYNGOSCOPY Bilateral 08/06/2022   Procedure: DIRECT LARYNGOSCOPY WITH BIOPSY;  Surgeon: Christia Reading, MD;  Location[facility] OR;  Service: ENT;  Laterality: Bilateral;   EYE SURGERY Bilateral    cataract   NASOPHARYNGEAL BIOPSY Left 08/06/2022   Procedure: NASOPHARYNGEAL BIOPSY;  Surgeon: Christia Reading, MD;  Location: [facility] OR;  Service: ENT;  Laterality: Left;   TONSILLECTOMY Left 08/06/2022   Procedure: TONSILLECTOMY;  Surgeon: Christia Reading, MD;  Location: [facility] OR;  Service: ENT;  Laterality: Left;   TRANSURETHRAL RESECTION OF PROSTATE N/A 08/08/2019   Procedure: TRANSURETHRAL RESECTION OF THE PROSTATE (TURP);  Surgeon: Crista Elliot, MD;  Location: [facility] ORS;  Service: Urology;  Laterality: N/A;   UPPER GASTROINTESTINAL ENDOSCOPY  06/2019   WISDOM TOOTH EXTRACTION      Social History:   reports that he quit smoking about 6 months ago. His smoking use included cigarettes. He started smoking about 54 years ago. He has a 25.6 pack-year smoking history. He has never used smokeless tobacco. He reports that he does not currently use alcohol. He reports that he does not use drugs.   Family History:  His family history includes Diabetes type II in his brother, father, mother, and sister; Hypertension in his brother and mother; Lung cancer in his mother; Other in his son. There is no history of CAD, Colon cancer, Esophageal cancer, Inflammatory bowel disease, Liver disease, Pancreatic cancer, Rectal cancer, or Stomach cancer.   Allergies: Allergies  Allergen Reactions   Hydrocodone Hives and Itching   Percocet [Oxycodone-Acetaminophen] Hives and Itching    Home Medications: Prior to Admission medications   Medication Sig Start Date End Date Taking? Authorizing Provider  acetaminophen (TYLENOL) 500 MG tablet Take 1,000 mg by mouth every 6 (six) hours as needed for headache (pain).     [provider]  amLODipine (NORVASC) 5 MG tablet Take 1  tablet (5 mg total) by mouth at bedtime. 10/22/22   Latrelle Dodrill, MD  blood glucose meter kit and supplies KIT Dispense based on patient and insurance preference. Use up to four times daily as directed. (FOR ICD-9 250.00, 250.01). 03/28/19   Hongalgi, Maximino Greenland, MD  carvedilol (COREG) 25 MG tablet Take 25 mg by mouth 2 (two) times daily with a meal.    [provider]  Cholecalciferol (VITAMIN D3) 125 MCG (5000 UT) CAPS Take 5,000 Units by mouth 2 (two) times a week.    [provider]  esomeprazole (NEXIUM) 40 MG capsule TAKE 1 CAPSULE BY MOUTH ONCE DAILY AT  NOON Patient taking differently: Take 40 mg by mouth daily as needed. 08/23/21   Mansouraty, Netty Starring., MD  famotidine (PEPCID) 20 MG tablet Take one tablet by mouth daily 01/02/23   Latrelle Dodrill, MD  gabapentin (NEURONTIN) 300 MG capsule Take 1 capsule (300 mg total) by mouth at bedtime. 01/12/23   Latrelle Dodrill, MD  hydrocortisone 2.5 % cream Apply to affected area twice daily as needed 01/21/23   Latrelle Dodrill, MD  Insulin Syringes, Disposable, U-100  0.5 ML MISC Use as per instructions 3 times daily AC. 03/28/19   Hongalgi, Maximino Greenland, MD  LANTUS SOLOSTAR 100 UNIT/ML Solostar Pen Inject 6 units subcutaneously once daily 01/21/23   Latrelle Dodrill, MD  loperamide (IMODIUM A-D) 2 MG tablet Take 1 tablet (2 mg total) by mouth 4 (four) times daily as needed for diarrhea or loose stools. 06/12/20   Latrelle Dodrill, MD  sodium bicarbonate 650 MG tablet Take 650 mg by mouth 2 (two) times daily.    [provider]  tamsulosin (FLOMAX) 0.4 MG CAPS capsule Take 0.4 mg by mouth daily.    [provider]  traMADol (ULTRAM) 50 MG tablet Take 1 tablet (50 mg total) by mouth every 6 (six) hours as needed. 02/26/22   Latrelle Dodrill, MD  trimethoprim (TRIMPEX) 100 MG tablet Take 100 mg by mouth daily. 01/08/22   [provider]  VELTASSA 8.4 g packet Take 1 packet by mouth  daily.    [provider]   Critical care time:   The patient is critically ill with multiple organ system failure and requires high complexity decision making for assessment and support, frequent evaluation and titration of therapies, advanced monitoring, review of radiographic studies and interpretation of complex data.   Critical Care Time devoted to patient care services, exclusive of separately billable procedures, described in this note is 38 minutes.  Tim Lair, PA-C [facility] 04/29/23 8:14 PM  Please see [email or web address] for pager details.  From 7A-7P if no response, please call [phone] After hours, please call ELink [phone]

## 2023-04-29 NOTE — Progress Notes (Signed)
eLink Physician-Brief Progress Note Patient Name: Louis Butler DOB: 11/07/1955 MRN: [id number]   Date of Service  04/29/2023  HPI/Events of Note  Patient with ESRD who unilaterally discontinued dialysis present with severe uremia and hyperkalemia. He is also complaining of nausea and otalgia (he has a history of head & neck cancer s/p operation but did not follow up for chemo / radiation therapy).  eICU Interventions  New Patient Evaluation, Hyperkalemia Rx, Emergent dialysis, Zofran PRN nausea, Oxycodone PRN pain.        Migdalia Dk 04/29/2023, 10:17 PM

## 2023-04-30 ENCOUNTER — Inpatient Hospital Stay (HOSPITAL_COMMUNITY): Payer: Medicare Other

## 2023-04-30 DIAGNOSIS — I2789 Other specified pulmonary heart diseases: Secondary | ICD-10-CM

## 2023-04-30 DIAGNOSIS — N189 Chronic kidney disease, unspecified: Secondary | ICD-10-CM

## 2023-04-30 DIAGNOSIS — E119 Type 2 diabetes mellitus without complications: Secondary | ICD-10-CM | POA: Diagnosis not present

## 2023-04-30 DIAGNOSIS — N179 Acute kidney failure, unspecified: Secondary | ICD-10-CM | POA: Diagnosis present

## 2023-04-30 DIAGNOSIS — G9340 Encephalopathy, unspecified: Secondary | ICD-10-CM | POA: Diagnosis not present

## 2023-04-30 DIAGNOSIS — N19 Unspecified kidney failure: Secondary | ICD-10-CM | POA: Diagnosis present

## 2023-04-30 DIAGNOSIS — E875 Hyperkalemia: Secondary | ICD-10-CM | POA: Diagnosis not present

## 2023-04-30 LAB — RENAL FUNCTION PANEL
Albumin: 2.8 g/dL — ABNORMAL LOW (ref 3.5–5.0)
Albumin: 2.6 g/dL — ABNORMAL LOW (ref 3.5–5.0)
Albumin: 2.5 g/dL — ABNORMAL LOW (ref 3.5–5.0)
Anion gap: 19 — ABNORMAL HIGH (ref 5–15)
Anion gap: 16 — ABNORMAL HIGH (ref 5–15)
Anion gap: 15 (ref 5–15)
BUN: 258 mg/dL — ABNORMAL HIGH (ref 8–23)
BUN: 109 mg/dL — ABNORMAL HIGH (ref 8–23)
BUN: 83 mg/dL — ABNORMAL HIGH (ref 8–23)
CO2: 13 mmol/L — ABNORMAL LOW (ref 22–32)
CO2: 20 mmol/L — ABNORMAL LOW (ref 22–32)
CO2: 18 mmol/L — ABNORMAL LOW (ref 22–32)
Calcium: 6.4 mg/dL — CL (ref 8.9–10.3)
Calcium: 7.7 mg/dL — ABNORMAL LOW (ref 8.9–10.3)
Calcium: 7.4 mg/dL — ABNORMAL LOW (ref 8.9–10.3)
Chloride: 92 mmol/L — ABNORMAL LOW (ref 98–111)
Chloride: 97 mmol/L — ABNORMAL LOW (ref 98–111)
Chloride: 99 mmol/L (ref 98–111)
Creatinine, Ser: 26.68 mg/dL — ABNORMAL HIGH (ref 0.61–1.24)
Creatinine, Ser: 12.45 mg/dL — ABNORMAL HIGH (ref 0.61–1.24)
Creatinine, Ser: 10.02 mg/dL — ABNORMAL HIGH (ref 0.61–1.24)
GFR, Estimated: 2 mL/min — ABNORMAL LOW (ref >60)
GFR, Estimated: 4 mL/min — ABNORMAL LOW (ref >60)
GFR, Estimated: 5 mL/min — ABNORMAL LOW (ref >60)
Glucose, Bld: 144 mg/dL — ABNORMAL HIGH (ref 70–99)
Glucose, Bld: 122 mg/dL — ABNORMAL HIGH (ref 70–99)
Glucose, Bld: 164 mg/dL — ABNORMAL HIGH (ref 70–99)
Phosphorus: >30 mg/dL — ABNORMAL HIGH (ref 2.5–4.6)
Phosphorus: 5.5 mg/dL — ABNORMAL HIGH (ref 2.5–4.6)
Phosphorus: 4.4 mg/dL (ref 2.5–4.6)
Potassium: 5.4 mmol/L — ABNORMAL HIGH (ref 3.5–5.1)
Potassium: 4.5 mmol/L (ref 3.5–5.1)
Potassium: 4.7 mmol/L (ref 3.5–5.1)
Sodium: 124 mmol/L — ABNORMAL LOW (ref 135–145)
Sodium: 133 mmol/L — ABNORMAL LOW (ref 135–145)
Sodium: 132 mmol/L — ABNORMAL LOW (ref 135–145)

## 2023-04-30 LAB — URINALYSIS, ROUTINE W REFLEX MICROSCOPIC
Bacteria, UA: NONE SEEN
Bilirubin Urine: NEGATIVE
Glucose, UA: NEGATIVE mg/dL
Ketones, ur: 5 mg/dL — AB
Nitrite: NEGATIVE
Protein, ur: 100 mg/dL — AB
RBC / HPF: >50 RBC/hpf (ref 0–5)
Specific Gravity, Urine: 1.012 (ref 1.005–1.030)
WBC, UA: >50 WBC/hpf (ref 0–5)
pH: 6 (ref 5.0–8.0)

## 2023-04-30 LAB — ECHOCARDIOGRAM COMPLETE
Area-P 1/2: 3.21 cm2
Height: 75 in
S' Lateral: 2.5 cm
Weight: 2744.29 [oz_av]

## 2023-04-30 LAB — HEPATIC FUNCTION PANEL
ALT: 11 U/L (ref 0–44)
AST: 15 U/L (ref 15–41)
Albumin: 2.4 g/dL — ABNORMAL LOW (ref 3.5–5.0)
Alkaline Phosphatase: 47 U/L (ref 38–126)
Bilirubin, Direct: 0.3 mg/dL — ABNORMAL HIGH (ref 0.0–0.2)
Indirect Bilirubin: 0.9 mg/dL (ref 0.3–0.9)
Total Bilirubin: 1.2 mg/dL (ref 0.0–1.2)
Total Protein: 7 g/dL (ref 6.5–8.1)

## 2023-04-30 LAB — LIPID PANEL
Cholesterol: 70 mg/dL (ref 0–200)
HDL: 21 mg/dL — ABNORMAL LOW (ref >40)
LDL Cholesterol: 34 mg/dL (ref 0–99)
Total CHOL/HDL Ratio: 3.3 {ratio}
Triglycerides: 77 mg/dL (ref <150)
VLDL: 15 mg/dL (ref 0–40)

## 2023-04-30 LAB — CBC
HCT: 23.7 % — ABNORMAL LOW (ref 39.0–52.0)
Hemoglobin: 8.2 g/dL — ABNORMAL LOW (ref 13.0–17.0)
MCH: 30.3 pg (ref 26.0–34.0)
MCHC: 34.6 g/dL (ref 30.0–36.0)
MCV: 87.5 fL (ref 80.0–100.0)
Platelets: 133 10*3/uL — ABNORMAL LOW (ref 150–400)
RBC: 2.71 MIL/uL — ABNORMAL LOW (ref 4.22–5.81)
RDW: 12.9 % (ref 11.5–15.5)
WBC: 6.5 10*3/uL (ref 4.0–10.5)
nRBC: 0 % (ref 0.0–0.2)

## 2023-04-30 LAB — IRON AND TIBC
Iron: 128 ug/dL (ref 45–182)
Saturation Ratios: 90 % — ABNORMAL HIGH (ref 17.9–39.5)
TIBC: 143 ug/dL — ABNORMAL LOW (ref 250–450)
UIBC: 15 ug/dL

## 2023-04-30 LAB — FERRITIN: Ferritin: 648 ng/mL — ABNORMAL HIGH (ref 24–336)

## 2023-04-30 LAB — BASIC METABOLIC PANEL
Anion gap: 21 — ABNORMAL HIGH (ref 5–15)
BUN: 248 mg/dL — ABNORMAL HIGH (ref 8–23)
CO2: 12 mmol/L — ABNORMAL LOW (ref 22–32)
Calcium: 6.8 mg/dL — ABNORMAL LOW (ref 8.9–10.3)
Chloride: 96 mmol/L — ABNORMAL LOW (ref 98–111)
Creatinine, Ser: 26.6 mg/dL — ABNORMAL HIGH (ref 0.61–1.24)
GFR, Estimated: 2 mL/min — ABNORMAL LOW (ref >60)
Glucose, Bld: 123 mg/dL — ABNORMAL HIGH (ref 70–99)
Potassium: 6.7 mmol/L (ref 3.5–5.1)
Sodium: 129 mmol/L — ABNORMAL LOW (ref 135–145)

## 2023-04-30 LAB — HEMOGLOBIN A1C
Hgb A1c MFr Bld: 6.8 % — ABNORMAL HIGH (ref 4.8–5.6)
Mean Plasma Glucose: 148.46 mg/dL

## 2023-04-30 LAB — GLUCOSE, CAPILLARY
Glucose-Capillary: 151 mg/dL — ABNORMAL HIGH (ref 70–99)
Glucose-Capillary: 95 mg/dL (ref 70–99)
Glucose-Capillary: 85 mg/dL (ref 70–99)
Glucose-Capillary: 105 mg/dL — ABNORMAL HIGH (ref 70–99)
Glucose-Capillary: 122 mg/dL — ABNORMAL HIGH (ref 70–99)

## 2023-04-30 LAB — POTASSIUM
Potassium: 5.4 mmol/L — ABNORMAL HIGH (ref 3.5–5.1)
Potassium: 3.9 mmol/L (ref 3.5–5.1)

## 2023-04-30 LAB — T4, FREE: Free T4: 0.88 ng/dL (ref 0.61–1.12)

## 2023-04-30 LAB — VITAMIN B12: Vitamin B-12: 765 pg/mL (ref 180–914)

## 2023-04-30 LAB — FOLATE: Folate: 9.8 ng/mL (ref >5.9)

## 2023-04-30 LAB — AMMONIA: Ammonia: 17 umol/L (ref 9–35)

## 2023-04-30 LAB — MAGNESIUM: Magnesium: 2.5 mg/dL — ABNORMAL HIGH (ref 1.7–2.4)

## 2023-04-30 LAB — TSH: TSH: 1.034 u[IU]/mL (ref 0.350–4.500)

## 2023-04-30 MED ORDER — HYDROXYZINE HCL 25 MG PO TABS
25.0000 mg | ORAL_TABLET | Freq: Once | ORAL | Status: AC | PRN
  Administered 2023-04-30: 25 mg via ORAL
  Filled 2023-04-30: qty 1

## 2023-04-30 MED ORDER — PRISMASOL BGK 4/2.5 32-4-2.5 MEQ/L EC SOLN
Status: DC
  Filled 2023-04-30 (×13): qty 5000

## 2023-04-30 MED ORDER — LIDOCAINE HCL (CARDIAC) PF 100 MG/5ML IV SOSY
PREFILLED_SYRINGE | INTRAVENOUS | Status: AC
  Filled 2023-04-30: qty 5

## 2023-04-30 MED ORDER — PRISMASOL BGK 4/2.5 32-4-2.5 MEQ/L REPLACEMENT SOLN
Status: DC
  Filled 2023-04-30 (×4): qty 5000

## 2023-04-30 MED ORDER — FENTANYL CITRATE PF 50 MCG/ML IJ SOSY
12.5000 ug | PREFILLED_SYRINGE | INTRAMUSCULAR | Status: DC | PRN
  Administered 2023-04-30 – 2023-05-04 (×4): 12.5 ug via INTRAVENOUS
  Filled 2023-04-30 (×4): qty 1

## 2023-04-30 MED ORDER — PIPERACILLIN-TAZOBACTAM 3.375 G IVPB 30 MIN
3.3750 g | Freq: Four times a day (QID) | INTRAVENOUS | Status: DC
  Administered 2023-04-30 – 2023-05-02 (×7): 3.375 g via INTRAVENOUS
  Filled 2023-04-30 (×7): qty 50

## 2023-04-30 MED ORDER — SODIUM ZIRCONIUM CYCLOSILICATE 10 G PO PACK
10.0000 g | PACK | Freq: Two times a day (BID) | ORAL | Status: DC
  Administered 2023-04-30: 10 g
  Filled 2023-04-30: qty 1

## 2023-04-30 MED ORDER — CALCIUM GLUCONATE-NACL 1-0.675 GM/50ML-% IV SOLN
1.0000 g | Freq: Once | INTRAVENOUS | Status: AC
  Administered 2023-04-30: 1000 mg via INTRAVENOUS
  Filled 2023-04-30: qty 50

## 2023-04-30 MED ORDER — HEPARIN (PORCINE) 2000 UNITS/L FOR CRRT: INTRAVENOUS_CENTRAL | Status: DC | PRN

## 2023-04-30 MED ORDER — NOREPINEPHRINE 4 MG/250ML-% IV SOLN
2.0000 ug/min | INTRAVENOUS | Status: DC
  Administered 2023-04-30: 2 ug/min via INTRAVENOUS
  Administered 2023-05-01: 7 ug/min via INTRAVENOUS
  Filled 2023-04-30 (×2): qty 250

## 2023-04-30 MED ORDER — HYDROXYZINE HCL 25 MG PO TABS
25.0000 mg | ORAL_TABLET | Freq: Four times a day (QID) | ORAL | Status: DC | PRN
  Administered 2023-04-30 – 2023-06-12 (×27): 25 mg via ORAL
  Filled 2023-04-30 (×29): qty 1

## 2023-04-30 MED ORDER — HEPARIN SODIUM (PORCINE) 1000 UNIT/ML DIALYSIS
1000.0000 [IU] | INTRAMUSCULAR | Status: DC | PRN
  Administered 2023-05-01: 2400 [IU] via INTRAVENOUS_CENTRAL
  Filled 2023-04-30: qty 4
  Filled 2023-04-30: qty 3
  Filled 2023-04-30: qty 6

## 2023-04-30 MED ORDER — CALCIUM GLUCONATE-NACL 2-0.675 GM/100ML-% IV SOLN
2.0000 g | Freq: Once | INTRAVENOUS | Status: AC
  Administered 2023-04-30: 2000 mg via INTRAVENOUS
  Filled 2023-04-30: qty 100

## 2023-04-30 MED ORDER — INSULIN ASPART 100 UNIT/ML IV SOLN
5.0000 [IU] | Freq: Once | INTRAVENOUS | Status: AC
  Administered 2023-04-30: 5 [IU] via INTRAVENOUS

## 2023-04-30 MED ORDER — HEPARIN SODIUM (PORCINE) 5000 UNIT/ML IJ SOLN
5000.0000 [IU] | Freq: Three times a day (TID) | INTRAMUSCULAR | Status: DC
  Administered 2023-04-30 – 2023-05-05 (×17): 5000 [IU] via SUBCUTANEOUS
  Filled 2023-04-30 (×17): qty 1

## 2023-04-30 MED ORDER — DEXTROSE 50 % IV SOLN
1.0000 | Freq: Once | INTRAVENOUS | Status: AC
  Administered 2023-04-30: 50 mL via INTRAVENOUS
  Filled 2023-04-30: qty 50

## 2023-04-30 MED ORDER — SODIUM CHLORIDE 0.9 % IV SOLN
250.0000 mL | INTRAVENOUS | Status: DC
  Administered 2023-04-30: 250 mL via INTRAVENOUS

## 2023-04-30 MED ORDER — INSULIN ASPART 100 UNIT/ML IJ SOLN
0.0000 [IU] | INTRAMUSCULAR | Status: DC
Start: 2023-04-30 — End: 2023-05-02
  Administered 2023-05-01: 1 [IU] via SUBCUTANEOUS

## 2023-04-30 MED ORDER — ONDANSETRON HCL 4 MG/2ML IJ SOLN
4.0000 mg | Freq: Four times a day (QID) | INTRAMUSCULAR | Status: AC | PRN
  Administered 2023-04-30 – 2023-05-01 (×2): 4 mg via INTRAVENOUS
  Filled 2023-04-30 (×2): qty 2

## 2023-04-30 MED ORDER — SODIUM ZIRCONIUM CYCLOSILICATE 10 G PO PACK: 10.0000 g | PACK | Freq: Two times a day (BID) | ORAL | Status: DC

## 2023-04-30 MED ORDER — ALBUTEROL SULFATE (2.5 MG/3ML) 0.083% IN NEBU
10.0000 mg | INHALATION_SOLUTION | Freq: Once | RESPIRATORY_TRACT | Status: AC
  Administered 2023-04-30: 10 mg via RESPIRATORY_TRACT
  Filled 2023-04-30: qty 12

## 2023-04-30 NOTE — Consult Note (Signed)
Urology Consult Note   Requesting Attending Physician:  Cheri Fowler, MD Service Providing Consult: Urology  Consulting Attending: Dr Jerilee Field    Reason for Consult:  bilateral hydronephrosis, acute urinary retention, AKI  HPI: Louis Butler is seen in consultation for reasons noted above at the request of Cheri Fowler, MD for the evaluation of bilateral hydronephrosis with AKI to 30 and acute urinary retention.  This is a 68 y.o. 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  He reports difficulty urinating for the past few days.  Has not had a full urination in 4 days.  At baseline he does not urinate much.  He said he makes very little urine in general but noticed that he was only able to get a couple drops out.  The drops that he was getting out were bloody.  Therefore he endorses positive hematuria.  Denies any urgency, frequency or dysuria.  Has had UTIs in the past with Pseudomonas.  Previous TURP in 2021.  He got off of dialysis in 2022 because it did not make him feel great.   Past Medical History: Past Medical History:  Diagnosis Date   Arthritis    Cellulitis 03/14/2019   feet   CKD (chronic kidney disease)    Diabetes mellitus without complication (HCC)    Type II   End-stage kidney disease (HCC) 03/14/2019   was on hemodialysis ~ June 2021 - March 2022 when he discontinued; followed by Dr. Zetta Bills   GERD (gastroesophageal reflux disease)    Heart murmur    History of blood transfusion    Hypertension    12/30/19- having low blood pressure since beginning   Irregular heart beat    UTI (urinary tract infection)     Past Surgical History:  Past Surgical History:  Procedure Laterality Date   arm surgery Right    was cut to the bone   BASCILIC VEIN  TRANSPOSITION Left 11/18/2019   Procedure: FIRST STAGE LEFT ARM BASCILIC VEIN TRANSPOSITION;  Surgeon: Nada Libman, MD;  Location: [facility] OR;  Service: Vascular;  Laterality: Left;   BASCILIC VEIN TRANSPOSITION Left 01/04/2020   Procedure: LEFT SECOND STAGE BASCILIC VEIN TRANSPOSITION;  Surgeon: Nada Libman, MD;  Location: [facility] OR;  Service: Vascular;  Laterality: Left;   COLONOSCOPY  06/2019   DIRECT LARYNGOSCOPY Bilateral 08/06/2022   Procedure: DIRECT LARYNGOSCOPY WITH BIOPSY;  Surgeon: Christia Reading, MD;  Location: [facility] OR;  Service: ENT;  Laterality: Bilateral;   EYE SURGERY Bilateral    cataract   NASOPHARYNGEAL BIOPSY Left 08/06/2022   Procedure: NASOPHARYNGEAL BIOPSY;  Surgeon: Christia Reading, MD;  Location: [facility] OR;  Service: ENT;  Laterality: Left;   TONSILLECTOMY Left 08/06/2022   Procedure: TONSILLECTOMY;  Surgeon: Christia Reading, MD;  Location: [facility] OR;  Service: ENT;  Laterality: Left;   TRANSURETHRAL RESECTION OF PROSTATE N/A 08/08/2019   Procedure: TRANSURETHRAL RESECTION OF THE PROSTATE (TURP);  Surgeon: Crista Elliot, MD;  Location: [facility] ORS;  Service: Urology;  Laterality: N/A;   UPPER GASTROINTESTINAL ENDOSCOPY  06/2019   WISDOM TOOTH EXTRACTION      Medication: Current Facility-Administered Medications  Medication Dose Route Frequency Provider Last Rate Last Admin    prismasol BGK 4/2.5 infusion   CRRT Continuous Ethelene Hal, MD 400 mL/hr at 04/30/23 1106 New  Bag at 04/30/23 1106    prismasol BGK 4/2.5 infusion   CRRT Continuous Ethelene Hal, MD 400 mL/hr at 04/30/23 1105 New Bag at 04/30/23 1105   acetaminophen (TYLENOL) tablet 1,000 mg  1,000 mg Oral Q6H PRN Patrici Ranks, MD   1,000 mg at 04/30/23 0606   Chlorhexidine Gluconate Cloth 2 % PADS 6 each  6 each Topical Q0600 Tyler Pita, MD   6 each at 04/30/23 9528   cholecalciferol (VITAMIN D3) 25 MCG (1000 UNIT) tablet 5,000 Units  5,000 Units Oral Once per day on Monday Thursday Patrici Ranks, MD   5,000 Units at  04/30/23 1112   docusate sodium (COLACE) capsule 100 mg  100 mg Oral BID PRN Patrici Ranks, MD       fentaNYL (SUBLIMAZE) injection 12.5 mcg  12.5 mcg Intravenous Q2H PRN Pia Mau D, PA-C   12.5 mcg at 04/30/23 1256   gabapentin (NEURONTIN) capsule 300 mg  300 mg Oral QHS Patrici Ranks, MD   300 mg at 04/29/23 2149   heparin injection 1,000-6,000 Units  1,000-6,000 Units CRRT PRN Ethelene Hal, MD       heparin injection 5,000 Units  5,000 Units Subcutaneous Q8H Lidia Collum, PA-C   5,000 Units at 04/30/23 1553   heparinized saline (2000 units/L) primer fluid for CRRT   CRRT PRN Ethelene Hal, MD       hydrOXYzine (ATARAX) tablet 25 mg  25 mg Oral Q6H PRN Lidia Collum, PA-C   25 mg at 04/30/23 1258   insulin aspart (novoLOG) injection 0-6 Units  0-6 Units Subcutaneous Q4H Pia Mau D, PA-C       loperamide (IMODIUM) capsule 2 mg  2 mg Oral QID PRN Albustami, Flonnie Hailstone, MD       norepinephrine (LEVOPHED) 4mg  in (0.016 mg/mL) premix infusion  2-10 mcg/min Intravenous Titrated Ogan, Okoronkwo U, MD       pantoprazole (PROTONIX) EC tablet 40 mg  40 mg Oral Daily Patrici Ranks, MD   40 mg at 04/30/23 1111   polyethylene glycol (MIRALAX / GLYCOLAX) packet 17 g  17 g Oral Daily PRN Albustami, Flonnie Hailstone, MD       prismasol BGK 4/2.5 infusion   CRRT Continuous Ethelene Hal, MD 1,500 mL/hr at 04/30/23 1444 New Bag at 04/30/23 1444   sodium bicarbonate tablet 650 mg  650 mg Oral BID Patrici Ranks, MD   650 mg at 04/30/23 1114   tamsulosin (FLOMAX) capsule 0.4 mg  0.4 mg Oral Daily Patrici Ranks, MD   0.4 mg at 04/30/23 1112    Allergies: Allergies  Allergen Reactions   Hydrocodone Hives and Itching   Percocet [Oxycodone-Acetaminophen] Hives and Itching    Social History: Social History   Tobacco Use   Smoking status: Former    Current packs/day: 0.00    Average packs/day: 0.5 packs/day for 52.5 years (25.6 ttl pk-yrs)    Types: Cigarettes    Start date: 03/07/1969     Quit date: 10/06/2022    Years since quitting: 0.5   Smokeless tobacco: Never  Vaping Use   Vaping status: Never Used  Substance Use Topics   Alcohol use: Not Currently    Comment: stopped drinking alcohol - November 2020   Drug use: No    Family History Family History  Problem Relation Age of Onset   Diabetes type II Mother    Lung cancer Mother  Hypertension Mother    Diabetes type II Father    Diabetes type II Sister    Diabetes type II Brother    Hypertension Brother    Other Son        burned   CAD Neg Hx    Colon cancer Neg Hx    Esophageal cancer Neg Hx    Inflammatory bowel disease Neg Hx    Liver disease Neg Hx    Pancreatic cancer Neg Hx    Rectal cancer Neg Hx    Stomach cancer Neg Hx     Review of Systems 10 systems were reviewed and are negative except as noted specifically in the HPI.  Objective   Vital signs in last 24 hours: BP (!) 98/55   Pulse 85   Temp (!) 97.5 F (36.4 C) (Oral)   Resp 17   Ht 6\' 3"  (1.905 m)   Wt 77.8 kg   SpO2 98%   BMI 21.44 kg/m   Physical Exam General: NAD, A&O, resting, appropriate HEENT: [place]/AT, EOMI, MMM Pulmonary: Normal work of breathing Cardiovascular: HDS, adequate peripheral perfusion Abdomen: Soft, tender in the suprapubic area. GU: Bladder palpable.  No CVA tenderness. Extremities: warm and well perfused Neuro: Appropriate, no focal neurological deficits  Most Recent Labs: Lab Results  Component Value Date   WBC 6.5 04/30/2023   HGB 8.2 (L) 04/30/2023   HCT 23.7 (L) 04/30/2023   PLT 133 (L) 04/30/2023    Lab Results  Component Value Date   NA 124 (L) 04/30/2023   K 3.9 04/30/2023   CL 92 (L) 04/30/2023   CO2 13 (L) 04/30/2023   BUN 258 (H) 04/30/2023   CREATININE 26.68 (H) 04/30/2023   CALCIUM 6.4 (LL) 04/30/2023   MG 2.5 (H) 04/30/2023   PHOS >30.0 (H) 04/30/2023    Lab Results  Component Value Date   INR 1.3 (H) 04/29/2023   APTT 27 04/29/2023     Urine  Culture: @[id number] (laburin,org,[id number])@   IMAGING: US RENAL Result Date: 04/30/2023 CLINICAL DATA:  Acute kidney injury EXAM: RENAL / URINARY TRACT ULTRASOUND COMPLETE COMPARISON:  PET CT 12/05/2022 ultrasound 04/08/2022 FINDINGS: Right Kidney: Renal measurements: 13 x 6.8 x 5.2 cm = volume: 485.4 mL. Cortex is echogenic. Diffuse cortical thinning consistent with atrophy. Severe hydronephrosis and hydroureter partially visualized. Cyst upper pole right kidney measuring 2.7 cm, no imaging follow-up is recommended. Left Kidney: Renal measurements: 12.4 x 5 x 5.1 cm = volume: 349.5 mL. Cortex is echogenic. Renal cortical thinning consistent with atrophy. Severe left-sided hydronephrosis and partially visualized hydroureter. Small cyst measuring 7 mm for which no imaging follow-up is recommended. Bladder: Avascular area within the dependent bladder measuring 5.6 by 4.8 x 1.3 cm, with straight edge suspected to represent fluid level Other: None. IMPRESSION: 1. Severe bilateral hydronephrosis and hydroureter which is new compared to prior 2. Echogenic kidneys with cortical thinning consistent with chronic kidney disease and atrophy 3. Suspect layering echogenic avascular debris or hemorrhagic material within the dependent bladder. Electronically Signed   By: Jasmine Pang M.D.   On: 04/30/2023 15:09   DG CHEST PORT 1 VIEW Result Date: 04/30/2023 CLINICAL DATA:  68 year old male status post dialysis catheter placement. Metastatic head and neck cancer. EXAM: PORTABLE CHEST 1 VIEW COMPARISON:  Portable chest 06/01/2020.  PET-CT 07/25/2022. FINDINGS: Portable AP semi upright view at 0947 hours. Right IJ approach vascular catheter placed, tip is at the level of the carina, SVC. Mediastinal contours are stable and within normal limits. Lower  lung volumes. No pneumothorax. Allowing for portable technique the lungs are clear. Visualized tracheal air column is within normal limits. Trace oral contrast in the gastric  fundus. Paucity of bowel gas. No acute osseous abnormality identified. IMPRESSION: Right IJ approach vascular catheter placed, tip at the level of the SVC. No pneumothorax or acute cardiopulmonary abnormality. Electronically Signed   By: Odessa Fleming M.D.   On: 04/30/2023 10:03    ------  Assessment:  68 y.o. 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  He is currently in the ICU on CRRT.  Patient was currently afebrile with blood pressures with SBP's in the 80s 200s however maps above 65.  Cr currently 26. Baseline between 3.4-5.   Foley catheter placed at bedside.    Recommendations: #Bilateral hydronephrosis and AKI -Placed catheter.  Continue catheter drainage. -Recommend CT scan for further evaluation  -Trend creatinine -Send a UA and a urine culture -Please start zosyn for history of pseudomonas positive Ucx. Follow up urine culture sent from bedside. Treat for total course of 14 days.  -monitor for post obstructive diuresis. Treatment per primary team.  -Do not remove Foley catheter without discussing with urology -pt will require outpatient cystoscopy given hematuria.    Urology to follow    Thank you for this consult. Please contact the urology consult pager with any further questions/concerns.

## 2023-04-30 NOTE — Progress Notes (Signed)
Louis Butler just called from lab for corrected creatinine 26.6mg /dL. Incorrect resulted was 26.1mg /dL

## 2023-04-30 NOTE — Progress Notes (Signed)
NAME:  Louis Butler, MRN:  [id number], DOB:  [birth year], LOS: 1 ADMISSION DATE:  04/29/2023, CONSULTATION DATE:  04/29/2023 REFERRING MD:  Jearld Fenton, EDP, CHIEF COMPLAINT:  ESRD; hyperkalemia; ams  History of Present Illness:  68 year old male with past medical history of ESRD (discontinued HD 06/2020, followed by Dr. 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   Patient is encephalopathic and history obtained from chart review and discussion with wife and multiple other family members (at bedside). Reported 1 week of nausea, vomiting, decreased PO intake. Additionally, symptoms of uremic Plt dysfunction noted with confusion/AMS, epistaxis and hematuria. Labs notable for Na 126, K >7.5, BUN/sCr 260/30.39, AG 21, Hgb 9.5. EKG with peaked T waves and was temporized with calcium gluconate, bicarb, insulin/dextrose, Lasix. Nephrology was consulted and recommended RRT, likely CRRT.  PCCM consulted for admission.   Pertinent  Medical History  ESRD (discontinued HD 06/2020, followed by Dr. Allena Katz), GERD, T2DM, HTN  Significant Hospital Events: Including procedures, antibiotic start and stop dates in addition to other pertinent events   1/22: Admit to ICU for renal failure, emergent HD 1/23 remains hyperkalemic; plan for temp hd placement and CRRT  Interim History / Subjective:  HD overnight K 5.4, na 124, co2 13, ag 19, calc 6.4  Objective   Blood pressure 115/66, pulse 80, temperature 97.9 F (36.6 C), temperature source Axillary, resp. rate 13, height 6\' 3"  (1.905 m), weight 76.3 kg, SpO2 96%.        Intake/Output Summary (Last 24 hours) at 04/30/2023 0817 Last data filed at 04/30/2023 0640 Gross per 24 hour  Intake 1140 ml  Output 0 ml  Net 1140 ml   Filed Weights   04/29/23 1745 04/29/23 2134  Weight: 93 kg 76.3 kg   Physical Examination: General:  chorill appearing on mech vent HEENT: MM pink/moist; L ear canal  with +flap s/p resection of SCC Neuro: some confusion but arousable AOx3; MAE w/ generalized weakness CV: s1s2, RRR, no m/r/g PULM:  dim clear BS bilaterally; RA GI: soft, bsx4 active  Extremities: warm/dry, ble scabbing; LUE fistula   Resolved Hospital Problem List:     Assessment & Plan:  Acute-on-chronic renal failure; baseline sCr ~3.8 (10/2022).  Discontinued dialysis 06/2020. Old LUE fistula, followed by Dr. Allena Katz ([facility]). BUN 260, Cr 30.39. K > 7.5, temporized with hyperkalemia protocol. Encephalopathic.  Progression to ESRD Hyperkalemia Hypocalcemia Uremia Plan: -nephro following; plan on temp HD placement for CRRT today -replete calcium -Trend BMP / urinary output -Replace electrolytes as indicated -Avoid nephrotoxic agents, ensure adequate renal perfusion  Acute metabolic encephalopathy in the setting of uremia Plan: -nephro plan to start CRRT today -limt sedating meds -check ammonia -check UA if able  T2DM  Plan: -ssi and cbg monitoring  Hypertension  Home medications: amlodipine, carvedilol. Plan: -hold home bp meds while normotensive  GERD Plan: - PPI  History of squamous cell carcinoma with involvement of head and neck Followed by Oncology at [facility]; s/p L neck dissection, L temporal bone resection and L ear canal dissection/flap 10/2022; initial plan for radiation in 01/2023 prompting dental extractions; per wife never got XRT or chemo, resection/dissection only. Plan: - Outpatient follow up with [facility]  Stage 2 sacral ulcer POA Plan: -woc consult  GOC Given patient's degree of critical illness in the setting of SCC and now ESRD, ongoing GOC discussions would be warranted for Louis Butler. We discussed that reinitiation of dialysis  would likely be a long-term necessity and not just a temporizing measure; we want to ensure that patient understands this as he makes ongoing care decisions. Will readdress GOC/patient's wishes after initial dialysis for  clearance/improvement in encephalopathy. - Remains FULL CODE at present   Best Practice (right click and "Reselect all SmartList Selections" daily)   Diet/type: NPO DVT prophylaxis: prophylactic heparin  Pressure ulcer(s): stage 2 sacral POA GI prophylaxis: PPI Lines: N/A Foley:  N/A Code Status:  full code Last date of multidisciplinary goals of care discussion [Pending - see above]  Labs   CBC: Recent Labs  Lab 04/29/23 1744  WBC 8.2  HGB 9.5*  HCT 28.6*  MCV 90.2  PLT 162   Basic Metabolic Panel: Recent Labs  Lab 04/29/23 1744 04/29/23 2016 04/30/23 0000 04/30/23 0410 04/30/23 0411 04/30/23 0412  NA 126*  --  129*  --   --  124*  K >7.5*  --  6.7*  --  5.4* 5.4*  CL 95*  --  96*  --   --  92*  CO2 10*  --  12*  --   --  13*  GLUCOSE 170* 280* 123*  --   --  144*  BUN 260*  --  248*  --   --  258*  CREATININE 30.39*  --  26.60*  --   --  26.68*  CALCIUM 6.7*  --  6.8*  --   --  6.4*  MG  --   --   --  2.5*  --   --   PHOS  --   --   --   --   --  >30.0*   GFR: Estimated Creatinine Clearance: 2.9 mL/min (A) (by C-G formula based on SCr of 26.68 mg/dL (H)). Recent Labs  Lab 04/29/23 1744  WBC 8.2   Liver Function Tests: Recent Labs  Lab 04/30/23 0412  ALBUMIN 2.8*   No results for input(s): "LIPASE", "AMYLASE" in the last 168 hours. No results for input(s): "AMMONIA" in the last 168 hours.  ABG:    Component Value Date/Time   TCO2 28 01/04/2020 0805    Coagulation Profile: Recent Labs  Lab 04/29/23 2134  INR 1.3*    Cardiac Enzymes: No results for input(s): "CKTOTAL", "CKMB", "CKMBINDEX", "TROPONINI" in the last 168 hours.  HbA1C: Hemoglobin A1C  Date/Time Value Ref Range Status  06/29/2020 12:00 AM 7.0  Final   HbA1c, POC (prediabetic range)  Date/Time Value Ref Range Status  10/22/2022 01:57 PM 6.3 5.7 - 6.4 % Final   HbA1c, POC (controlled diabetic range)  Date/Time Value Ref Range Status  05/01/2022 10:18 AM 6.6 0.0 - 7.0 %  Final  01/30/2022 09:39 AM 6.3 0.0 - 7.0 % Final   Hgb A1c MFr Bld  Date/Time Value Ref Range Status  04/30/2023 04:10 AM 6.8 (H) 4.8 - 5.6 % Final    Comment:    (NOTE) Pre diabetes:          5.7%-6.4%  Diabetes:              >6.4%  Glycemic control for   <7.0% adults with diabetes    CBG: Recent Labs  Lab 04/29/23 1744 04/29/23 1936 04/29/23 2123 04/30/23 0319 04/30/23 0741  GLUCAP 164* 216* 159* 151* 95   Review of Systems:    Past Medical History:  He,  has a past medical history of Arthritis, Cellulitis (03/14/2019), CKD (chronic kidney disease), Diabetes mellitus without complication (HCC), End-stage kidney disease (  HCC) (03/14/2019), GERD (gastroesophageal reflux disease), Heart murmur, History of blood transfusion, Hypertension, Irregular heart beat, and UTI (urinary tract infection).   Surgical History:   Past Surgical History:  Procedure Laterality Date   arm surgery Right    was cut to the bone   BASCILIC VEIN TRANSPOSITION Left 11/18/2019   Procedure: FIRST STAGE LEFT ARM BASCILIC VEIN TRANSPOSITION;  Surgeon: Nada Libman, MD;  Location: [facility] OR;  Service: Vascular;  Laterality: Left;   BASCILIC VEIN TRANSPOSITION Left 01/04/2020   Procedure: LEFT SECOND STAGE BASCILIC VEIN TRANSPOSITION;  Surgeon: Nada Libman, MD;  Location: [facility] OR;  Service: Vascular;  Laterality: Left;   COLONOSCOPY  06/2019   DIRECT LARYNGOSCOPY Bilateral 08/06/2022   Procedure: DIRECT LARYNGOSCOPY WITH BIOPSY;  Surgeon: Christia Reading, MD;  Location[facility] OR;  Service: ENT;  Laterality: Bilateral;   EYE SURGERY Bilateral    cataract   NASOPHARYNGEAL BIOPSY Left 08/06/2022   Procedure: NASOPHARYNGEAL BIOPSY;  Surgeon: Christia Reading, MD;  Location: [facility] OR;  Service: ENT;  Laterality: Left;   TONSILLECTOMY Left 08/06/2022   Procedure: TONSILLECTOMY;  Surgeon: Christia Reading, MD;  Location: [facility] OR;  Service: ENT;  Laterality: Left;   TRANSURETHRAL RESECTION OF PROSTATE N/A 08/08/2019    Procedure: TRANSURETHRAL RESECTION OF THE PROSTATE (TURP);  Surgeon: Crista Elliot, MD;  Location: [facility] ORS;  Service: Urology;  Laterality: N/A;   UPPER GASTROINTESTINAL ENDOSCOPY  06/2019   WISDOM TOOTH EXTRACTION      Social History:   reports that he quit smoking about 6 months ago. His smoking use included cigarettes. He started smoking about 54 years ago. He has a 25.6 pack-year smoking history. He has never used smokeless tobacco. He reports that he does not currently use alcohol. He reports that he does not use drugs.   Family History:  His family history includes Diabetes type II in his brother, father, mother, and sister; Hypertension in his brother and mother; Lung cancer in his mother; Other in his son. There is no history of CAD, Colon cancer, Esophageal cancer, Inflammatory bowel disease, Liver disease, Pancreatic cancer, Rectal cancer, or Stomach cancer.   Allergies: Allergies  Allergen Reactions   Hydrocodone Hives and Itching   Percocet [Oxycodone-Acetaminophen] Hives and Itching    Home Medications: Prior to Admission medications   Medication Sig Start Date End Date Taking? Authorizing Provider  acetaminophen (TYLENOL) 500 MG tablet Take 1,000 mg by mouth every 6 (six) hours as needed for headache (pain).     [provider]  amLODipine (NORVASC) 5 MG tablet Take 1 tablet (5 mg total) by mouth at bedtime. 10/22/22   Latrelle Dodrill, MD  blood glucose meter kit and supplies KIT Dispense based on patient and insurance preference. Use up to four times daily as directed. (FOR ICD-9 250.00, 250.01). 03/28/19   Hongalgi, Maximino Greenland, MD  carvedilol (COREG) 25 MG tablet Take 25 mg by mouth 2 (two) times daily with a meal.    [provider]  Cholecalciferol (VITAMIN D3) 125 MCG (5000 UT) CAPS Take 5,000 Units by mouth 2 (two) times a week.    [provider]  esomeprazole (NEXIUM) 40 MG capsule TAKE 1 CAPSULE BY MOUTH ONCE DAILY AT  NOON Patient  taking differently: Take 40 mg by mouth daily as needed. 08/23/21   Mansouraty, Netty Starring., MD  famotidine (PEPCID) 20 MG tablet Take one tablet by mouth daily 01/02/23   Latrelle Dodrill, MD  gabapentin (NEURONTIN) 300 MG capsule  Take 1 capsule (300 mg total) by mouth at bedtime. 01/12/23   Latrelle Dodrill, MD  hydrocortisone 2.5 % cream Apply to affected area twice daily as needed 01/21/23   Latrelle Dodrill, MD  Insulin Syringes, Disposable, U-100 0.5 ML MISC Use as per instructions 3 times daily AC. 03/28/19   Hongalgi, Maximino Greenland, MD  LANTUS SOLOSTAR 100 UNIT/ML Solostar Pen Inject 6 units subcutaneously once daily 01/21/23   Latrelle Dodrill, MD  loperamide (IMODIUM A-D) 2 MG tablet Take 1 tablet (2 mg total) by mouth 4 (four) times daily as needed for diarrhea or loose stools. 06/12/20   Latrelle Dodrill, MD  sodium bicarbonate 650 MG tablet Take 650 mg by mouth 2 (two) times daily.    [provider]  tamsulosin (FLOMAX) 0.4 MG CAPS capsule Take 0.4 mg by mouth daily.    [provider]  traMADol (ULTRAM) 50 MG tablet Take 1 tablet (50 mg total) by mouth every 6 (six) hours as needed. 02/26/22   Latrelle Dodrill, MD  trimethoprim (TRIMPEX) 100 MG tablet Take 100 mg by mouth daily. 01/08/22   [provider]  VELTASSA 8.4 g packet Take 1 packet by mouth daily.    [provider]   Critical care time: 35 minutes     Lidia Collum, PA-C [facility] 04/30/23 8:17 AM  Please see [email or web address] for pager details.  From 7A-7P if no response, please call [phone] After hours, please call ELink [phone]

## 2023-04-30 NOTE — Progress Notes (Signed)
Pharmacy Antibiotic Note  Louis Butler is a 68 y.o. male admitted on 04/29/2023 with  UTI; hx of pseudomonal UTI .  Pharmacy has been consulted for Zosyn dosing.  Pt is on CRRT  Plan: Zosyn 3.375gm IV q6h Will f/u micro data, pt's clinical condition, and CRRT tolerance  Height: 6\' 3"  (190.5 cm) Weight: 77.8 kg (171 lb 8.3 oz) IBW/kg (Calculated) : 84.5  Temp (24hrs), Avg:97.7 F (36.5 C), Min:97 F (36.1 C), Max:98.1 F (36.7 C)  Recent Labs  Lab 04/29/23 1744 04/30/23 0000 04/30/23 0410 04/30/23 0412 04/30/23 1622  WBC 8.2  --  6.5  --   --   CREATININE 30.39* 26.60*  --  26.68* 12.45*    Estimated Creatinine Clearance: 6.3 mL/min (A) (by C-G formula based on SCr of 12.45 mg/dL (H)).    Allergies  Allergen Reactions   Hydrocodone Hives and Itching   Percocet [Oxycodone-Acetaminophen] Hives and Itching    Antimicrobials this admission: 1/23 Zosyn >>   Microbiology results: Pending  Thank you for allowing pharmacy to be a part of this patient's care.  Christoper Fabian, PharmD, BCPS Please see amion for complete clinical pharmacist phone list 04/30/2023 5:31 PM

## 2023-04-30 NOTE — Procedures (Signed)
Foley Catheter Placement Note  Indications: 68 y.o. male with urinary retention, bilateral hydronephrosis, AKI.   Pre-operative Diagnosis: Urinary retention  Post-operative Diagnosis: Same  Surgeon: Jerald Kief, MD  Assistants: None  Procedure Details  Patient was placed in the supine position, prepped with Betadine and draped in the usual sterile fashion.  We injected lidocaine jelly per urethra prior to the procedure.  We then inserted a 16 French straight tip foley catheter per urethra which passed into the bladder with some resistance.  We achieved return of clear yellow urine with pink debris and then proceeded to insert 10 mL of sterile water into the Foley balloon. The catheter was flushed and pink thick debris was removed until the urine was clear.  The catheter was attached to a drainage bag and secured with a StatLock.  Placement of the catheter had return of greater than 600 mL of urine/debris.               Complications: None; patient tolerated the procedure well.  Plan:   1.  See consult note for plan        Attending Attestation: Dr. Mena Goes was available.

## 2023-04-30 NOTE — Progress Notes (Signed)
Louis Butler is an 68 y.o. male with CKD 5 (f/b Dr. Ricard Dillon), DM, HTN, recent squamous cell ca H&N (surgery, XRT 2024), anemia who presented with N/V/abd pain and was found to have marked hyperkalemia and renal failure for which nephrology is consulted.  Was on HD 2021-06/2020 when he self discontinued and has followed in clinic since.  Last OV with Dr. Allena Katz was 02/19/24 - GFR11, Hb 9.6.  Pt unclear if he would be accepting of long term dialysis.    Assessment/Plan: 1) CKD 5 with AKI vs progression to ESRD:  Longstanding advanced CKD, was on dialysis 2022 but stopped and has followed with Dr. Allena Katz since; most recent GFR 02/2023 11. By history seems patient may have had progression of CKD which led to his acute presentation though there is some concern for acute GI illness potentiating AKI.  Either way emergent dialysis is indicated now to which he is agreeable.    - Tolerated iHD overnight but only 2 hours @ low efficiency given very high risk for dialysis states disequilibrium; he really should be transition to CRRT. Once less uremic hopefully patient can express wishes should he continue to need HD long term.   Follow I/Os, BID labs, avoid nephrotoxins, dose meds for GFR < 10.    D/w CCM and will place a temporary catheter.  Spouse bedside updated.  After he is more lucid will need GOC to be established.  2) Hyperkalemia: CRRT now; had med management in ED.    3) Hyponatremia: suspect hypovolemic, IVF for now; CRRT will also help correct.    4) HTN: on coreg baseline, hold in setting of marginal BP   5) AGMA: in setting of severe AKI, defer VBG to PCCM; can give bicarb pushes PRN, CRRT will correct.    6) Anemia: Hb 9.5, add iron indices.  Would clarify his H&N ca status prior to ESA administration.     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 Has rec'd IV ca supplement.    Subjective: Fever chills nausea vomiting questionable ROS, still allergic and moaning but is  oriented to place and time   Chemistry and CBC: Creatinine, Ser  Date/Time Value Ref Range Status  04/30/2023 04:12 AM 26.68 (H) 0.61 - 1.24 mg/dL Final    Comment:    RESULT CONFIRMED BY MANUAL DILUTION  04/30/2023 12:00 AM 26.60 (H) 0.61 - 1.24 mg/dL Corrected    Comment:    RESULT CONFIRMED BY MANUAL DILUTION CORRECTED RESULTS CALLED TO: M.MEJIA 0046 04/30/2023 BY G.GANADEN CORRECTED ON 01/23 AT 0046: PREVIOUSLY REPORTED AS 26.10 RESULT CONFIRMED BY MANUAL DILUTION   04/29/2023 05:44 PM 30.39 (H) 0.61 - 1.24 mg/dL Final    Comment:    RESULTS CONFIRMED BY MANUAL DILUTION  08/06/2022 09:07 AM 5.58 (H) 0.61 - 1.24 mg/dL Final  16/01/9603 54:09 PM 5.03 (H) 0.76 - 1.27 mg/dL Final  81/19/1478 29:56 PM 5.07 (H) 0.76 - 1.27 mg/dL Final  21/30/8657 84:69 PM 5.09 (H) 0.76 - 1.27 mg/dL Final  62/95/2841 32:44 AM 4.22 (H) 0.76 - 1.27 mg/dL Final  04/09/7251 66:44 PM 4.57 (H) 0.76 - 1.27 mg/dL Final  03/47/4259 56:38 PM 3.42 (H) 0.61 - 1.24 mg/dL Final  75/64/3329 51:88 AM 3.60 (H) 0.61 - 1.24 mg/dL Final  41/66/0630 16:01 AM 3.20 (H) 0.61 - 1.24 mg/dL Final  09/32/3557 32:20 AM 4.47 (H) 0.61 - 1.24 mg/dL Final  25/42/7062 37:62 PM 4.62 (H) 0.61 - 1.24 mg/dL Final  83/15/1761 60:73 PM  5.15 (H) 0.61 - 1.24 mg/dL Final  16/01/9603 54:09 PM 4.41 (H) 0.76 - 1.27 mg/dL Final  81/19/1478 29:56 AM 4.34 (H) 0.61 - 1.24 mg/dL Final  21/30/8657 84:69 AM 4.27 (H) 0.61 - 1.24 mg/dL Final  62/95/2841 32:44 AM 4.20 (H) 0.61 - 1.24 mg/dL Final  04/09/7251 66:44 AM 3.96 (H) 0.61 - 1.24 mg/dL Final  03/47/4259 56:38 PM 3.94 (H) 0.61 - 1.24 mg/dL Final  75/64/3329 51:88 AM 4.70 (H) 0.61 - 1.24 mg/dL Final  41/66/0630 16:01 AM 4.75 (H) 0.61 - 1.24 mg/dL Final  09/32/3557 32:20 AM 5.06 (H) 0.61 - 1.24 mg/dL Final  25/42/7062 37:62 AM 5.23 (H) 0.61 - 1.24 mg/dL Final  83/15/1761 60:73 AM 5.70 (H) 0.61 - 1.24 mg/dL Final  71/09/2692 85:46 AM 5.75 (H) 0.61 - 1.24 mg/dL Final  27/06/5007 38:18 AM 5.67 (H)  0.61 - 1.24 mg/dL Final  29/93/7169 67:89 AM 5.37 (H) 0.61 - 1.24 mg/dL Final  38/01/1750 02:58 AM 4.88 (H) 0.61 - 1.24 mg/dL Final  52/77/8242 35:36 AM 4.54 (H) 0.61 - 1.24 mg/dL Final  14/43/1540 08:67 AM 4.71 (H) 0.61 - 1.24 mg/dL Final  61/95/0932 67:12 AM 4.55 (H) 0.61 - 1.24 mg/dL Final  45/80/9983 38:25 AM 4.29 (H) 0.61 - 1.24 mg/dL Final  05/39/7673 41:93 AM 4.80 (H) 0.61 - 1.24 mg/dL Final  79/05/4095 35:32 PM 4.80 (H) 0.61 - 1.24 mg/dL Final  99/24/2683 41:96 AM 4.93 (H) 0.61 - 1.24 mg/dL Final  22/29/7989 21:19 PM 5.24 (H) 0.61 - 1.24 mg/dL Final  41/74/0814 48:18 AM 1.20 0.61 - 1.24 mg/dL Final  56/31/4970 26:37 PM 0.80 0.61 - 1.24 mg/dL Final  85/88/5027 74:12 AM 1.02 0.61 - 1.24 mg/dL Final  87/86/7672 09:47 AM 0.90 0.61 - 1.24 mg/dL Final  09/62/8366 29:47 AM 0.87 0.61 - 1.24 mg/dL Final  65/46/5035 46:56 AM 0.89 0.61 - 1.24 mg/dL Final  81/27/5170 01:74 PM 0.86 0.61 - 1.24 mg/dL Final  94/49/6759 16:38 AM 1.01 0.61 - 1.24 mg/dL Final   Recent Labs  Lab 04/29/23 1744 04/29/23 2016 04/30/23 0000 04/30/23 0411 04/30/23 0412  NA 126*  --  129*  --  124*  K >7.5*  --  6.7* 5.4* 5.4*  CL 95*  --  96*  --  92*  CO2 10*  --  12*  --  13*  GLUCOSE 170* 280* 123*  --  144*  BUN 260*  --  248*  --  258*  CREATININE 30.39*  --  26.60*  --  26.68*  CALCIUM 6.7*  --  6.8*  --  6.4*  PHOS  --   --   --   --  >30.0*   Recent Labs  Lab 04/29/23 1744  WBC 8.2  HGB 9.5*  HCT 28.6*  MCV 90.2  PLT 162   Liver Function Tests: Recent Labs  Lab 04/30/23 0412  ALBUMIN 2.8*   No results for input(s): "LIPASE", "AMYLASE" in the last 168 hours. No results for input(s): "AMMONIA" in the last 168 hours. Cardiac Enzymes: No results for input(s): "CKTOTAL", "CKMB", "CKMBINDEX", "TROPONINI" in the last 168 hours. Iron Studies:  Recent Labs    04/30/23 0410  IRON 128  TIBC 143*  FERRITIN 648*   PT/INR: @LABRCNTIP (inr:5)  Xrays/Other Studies: ) Results for orders  placed or performed during the hospital encounter of 04/29/23 (from the past 48 hours)  CBG monitoring, ED     Status: Abnormal   Collection Time: 04/29/23  5:44 PM  Result Value Ref Range  Glucose-Capillary 164 (H) 70 - 99 mg/dL    Comment: Glucose reference range applies only to samples taken after fasting for at least 8 hours.  Basic metabolic panel     Status: Abnormal   Collection Time: 04/29/23  5:44 PM  Result Value Ref Range   Sodium 126 (L) 135 - 145 mmol/L   Potassium >7.5 (HH) 3.5 - 5.1 mmol/L    Comment: CRITICAL RESULT CALLED TO, READ BACK BY AND VERIFIED WITH P.BLANCHARD,RN @1842  04/29/2023 VANG.J REPEATED TO VERIFY    Chloride 95 (L) 98 - 111 mmol/L   CO2 10 (L) 22 - 32 mmol/L   Glucose, Bld 170 (H) 70 - 99 mg/dL    Comment: Glucose reference range applies only to samples taken after fasting for at least 8 hours.   BUN 260 (H) 8 - 23 mg/dL   Creatinine, Ser 60.45 (H) 0.61 - 1.24 mg/dL    Comment: RESULTS CONFIRMED BY MANUAL DILUTION   Calcium 6.7 (L) 8.9 - 10.3 mg/dL   GFR, Estimated 1 (L) >60 mL/min    Comment: (NOTE) Calculated using the CKD-EPI Creatinine Equation (2021)    Anion gap 21 (H) 5 - 15    Comment: ELECTROLYTES REPEATED TO VERIFY Performed at [facility] Lab, [address], [place], [place] [address]   CBC     Status: Abnormal   Collection Time: 04/29/23  5:44 PM  Result Value Ref Range   WBC 8.2 4.0 - 10.5 K/uL   RBC 3.17 (L) 4.22 - 5.81 MIL/uL   Hemoglobin 9.5 (L) 13.0 - 17.0 g/dL   HCT 19.1 (L) 47.8 - 29.5 %   MCV 90.2 80.0 - 100.0 fL   MCH 30.0 26.0 - 34.0 pg   MCHC 33.2 30.0 - 36.0 g/dL   RDW 62.1 30.8 - 65.7 %   Platelets 162 150 - 400 K/uL   nRBC 0.0 0.0 - 0.2 %    Comment: Performed at [facility], [address], [place], [place] [address]  Troponin I (High Sensitivity)     Status: None   Collection Time: 04/29/23  6:09 PM  Result Value Ref Range   Troponin I (High Sensitivity) 6 <18 ng/L    Comment: (NOTE) Elevated  high sensitivity troponin I (hsTnI) values and significant  changes across serial measurements may suggest ACS but many other  chronic and acute conditions are known to elevate hsTnI results.  Refer to the "Links" section for chest pain algorithms and additional  guidance. Performed at [facility], [address], [place], [place] [address]   CBG monitoring, ED     Status: Abnormal   Collection Time: 04/29/23  7:36 PM  Result Value Ref Range   Glucose-Capillary 216 (H) 70 - 99 mg/dL    Comment: Glucose reference range applies only to samples taken after fasting for at least 8 hours.  Glucose, random     Status: Abnormal   Collection Time: 04/29/23  8:16 PM  Result Value Ref Range   Glucose, Bld 280 (H) 70 - 99 mg/dL    Comment: Glucose reference range applies only to samples taken after fasting for at least 8 hours. Performed at [facility], [address], [place], [place] [address]   Hepatitis B surface antigen     Status: None   Collection Time: 04/29/23  8:16 PM  Result Value Ref Range   Hepatitis B Surface Ag NON REACTIVE NON REACTIVE    Comment: Performed at  [facility], [address] [place], [place] [address]  MRSA Next Gen by PCR, Nasal     Status: None   Collection Time: 04/29/23  9:20 PM   Specimen: Nasal Mucosa; Nasal Swab  Result Value Ref Range   MRSA by PCR Next Gen NOT DETECTED NOT DETECTED    Comment: (NOTE) The GeneXpert MRSA Assay (FDA approved for NASAL specimens only), is one component of a comprehensive MRSA colonization surveillance program. It is not intended to diagnose MRSA infection nor to guide or monitor treatment for MRSA infections. Test performance is not FDA approved in patients less than 68 years old. Performed at [facility], [address] [place], [place] [address]   Glucose, capillary     Status: Abnormal   Collection Time: 04/29/23  9:23 PM  Result Value Ref Range   Glucose-Capillary 159 (H) 70 - 99 mg/dL     Comment: Glucose reference range applies only to samples taken after fasting for at least 8 hours.  APTT     Status: None   Collection Time: 04/29/23  9:34 PM  Result Value Ref Range   aPTT 27 24 - 36 seconds    Comment: Performed at [facility], [address], [place], [place] [address]  Protime-INR     Status: Abnormal   Collection Time: 04/29/23  9:34 PM  Result Value Ref Range   Prothrombin Time 16.7 (H) 11.4 - 15.2 seconds   INR 1.3 (H) 0.8 - 1.2    Comment: (NOTE) INR goal varies based on device and disease states. Performed at [facility], [address] [place], [place] [address]   Basic metabolic panel     Status: Abnormal   Collection Time: 04/30/23 12:00 AM  Result Value Ref Range   Sodium 129 (L) 135 - 145 mmol/L   Potassium 6.7 (HH) 3.5 - 5.1 mmol/L    Comment: CRITICAL RESULT CALLED TO, READ BACK BY AND VERIFIED WITH M.MEJIA RN 0045 04/30/2023 BY G.GANADEN   Chloride 96 (L) 98 - 111 mmol/L   CO2 12 (L) 22 - 32 mmol/L   Glucose, Bld 123 (H) 70 - 99 mg/dL    Comment: Glucose reference range applies only to samples taken after fasting for at least 8 hours.   BUN 248 (H) 8 - 23 mg/dL   Creatinine, Ser 29.56 (H) 0.61 - 1.24 mg/dL    Comment: RESULT CONFIRMED BY MANUAL DILUTION CORRECTED RESULTS CALLED TO: M.MEJIA 0046 04/30/2023 BY G.GANADEN CORRECTED ON 01/23 AT 0046: PREVIOUSLY REPORTED AS 26.10 RESULT CONFIRMED BY MANUAL DILUTION    Calcium 6.8 (L) 8.9 - 10.3 mg/dL   GFR, Estimated 2 (L) >60 mL/min    Comment: (NOTE) Calculated using the CKD-EPI Creatinine Equation (2021)    Anion gap 21 (H) 5 - 15    Comment: ELECTROLYTES REPEATED TO VERIFY Performed at [facility], [address], [place], [place] [address]   Glucose, capillary     Status: Abnormal   Collection Time: 04/30/23  3:19 AM  Result Value Ref Range   Glucose-Capillary 151 (H) 70 - 99 mg/dL    Comment: Glucose reference range applies only to samples taken after fasting  for at least 8 hours.  Hemoglobin A1c     Status: Abnormal   Collection Time: 04/30/23  4:10 AM  Result Value Ref Range   Hgb A1c MFr Bld 6.8 (H) 4.8 - 5.6 %    Comment: (NOTE) Pre diabetes:  5.7%-6.4%  Diabetes:              >6.4%  Glycemic control for   <7.0% adults with diabetes    Mean Plasma Glucose 148.46 mg/dL    Comment: Performed at [facility], [address] [place], [place] [address]  Ferritin     Status: Abnormal   Collection Time: 04/30/23  4:10 AM  Result Value Ref Range   Ferritin 648 (H) 24 - 336 ng/mL    Comment: Performed at [facility], [address], [place], [place] [address]  Iron and TIBC     Status: Abnormal   Collection Time: 04/30/23  4:10 AM  Result Value Ref Range   Iron 128 45 - 182 ug/dL   TIBC 244 (L) 010 - 272 ug/dL   Saturation Ratios 90 (H) 17.9 - 39.5 %   UIBC 15 ug/dL    Comment: Performed at [facility], [address], [place], [place] [address]  Magnesium     Status: Abnormal   Collection Time: 04/30/23  4:10 AM  Result Value Ref Range   Magnesium 2.5 (H) 1.7 - 2.4 mg/dL    Comment: Performed at [facility], [address] [place], [place] [address]  Lipid panel     Status: Abnormal   Collection Time: 04/30/23  4:10 AM  Result Value Ref Range   Cholesterol 70 0 - 200 mg/dL   Triglycerides 77 <425 mg/dL   HDL 21 (L) >95 mg/dL   Total CHOL/HDL Ratio 3.3 RATIO   VLDL 15 0 - 40 mg/dL   LDL Cholesterol 34 0 - 99 mg/dL    Comment:        Total Cholesterol/HDL:CHD Risk Coronary Heart Disease Risk Table                     Men   Women  1/2 Average Risk   3.4   3.3  Average Risk       5.0   4.4  2 X Average Risk   9.6   7.1  3 X Average Risk  23.4   11.0        Use the calculated Patient Ratio above and the CHD Risk Table to determine the patient's CHD Risk.        ATP III CLASSIFICATION (LDL):  <100     mg/dL   Optimal  638-756  mg/dL   Near or Above                    Optimal  130-159   mg/dL   Borderline  433-295  mg/dL   High  >188     mg/dL   Very High Performed at [facility], [address] [place], [place] [address]   T4, free     Status: None   Collection Time: 04/30/23  4:10 AM  Result Value Ref Range   Free T4 0.88 0.61 - 1.12 ng/dL    Comment: (NOTE) Biotin ingestion may interfere with free T4 tests. If the results are inconsistent with the TSH level, previous test results, or the clinical presentation, then consider biotin interference. If needed, order repeat testing after stopping biotin. Performed at [facility], [address] [place], [place] [address]   Vitamin B12     Status: None   Collection Time: 04/30/23  4:10 AM  Result Value Ref Range   Vitamin B-12 765 180 -  914 pg/mL    Comment: (NOTE) This assay is not validated for testing neonatal or myeloproliferative syndrome specimens for Vitamin B12 levels. Performed at [facility], [address] [place], [place] [address]   TSH     Status: None   Collection Time: 04/30/23  4:11 AM  Result Value Ref Range   TSH 1.034 0.350 - 4.500 uIU/mL    Comment: Performed by a 3rd Generation assay with a functional sensitivity of <=0.01 uIU/mL. Performed at [facility], [address] [place], [place] [address]   Potassium     Status: Abnormal   Collection Time: 04/30/23  4:11 AM  Result Value Ref Range   Potassium 5.4 (H) 3.5 - 5.1 mmol/L    Comment: Performed at [facility], [address] [place], [place] [address]  Renal function panel     Status: Abnormal   Collection Time: 04/30/23  4:12 AM  Result Value Ref Range   Sodium 124 (L) 135 - 145 mmol/L   Potassium 5.4 (H) 3.5 - 5.1 mmol/L   Chloride 92 (L) 98 - 111 mmol/L   CO2 13 (L) 22 - 32 mmol/L   Glucose, Bld 144 (H) 70 - 99 mg/dL    Comment: Glucose reference range applies only to samples taken after fasting for at least 8 hours.   BUN 258 (H) 8 - 23 mg/dL   Creatinine, Ser 64.40 (H) 0.61 - 1.24  mg/dL    Comment: RESULT CONFIRMED BY MANUAL DILUTION   Calcium 6.4 (LL) 8.9 - 10.3 mg/dL    Comment: CRITICAL RESULT CALLED TO, READ BACK BY AND VERIFIED WITH E.MEJIA  RN [phone] 04/30/2023 BY G.GANADEN   Phosphorus >30.0 (H) 2.5 - 4.6 mg/dL    Comment: RESULT CONFIRMED BY MANUAL DILUTION   Albumin 2.8 (L) 3.5 - 5.0 g/dL   GFR, Estimated 2 (L) >60 mL/min    Comment: (NOTE) Calculated using the CKD-EPI Creatinine Equation (2021)    Anion gap 19 (H) 5 - 15    Comment: Performed at [facility] Lab, [address], [place], [place] [address]  Glucose, capillary     Status: None   Collection Time: 04/30/23  7:41 AM  Result Value Ref Range   Glucose-Capillary 95 70 - 99 mg/dL    Comment: Glucose reference range applies only to samples taken after fasting for at least 8 hours.   No results found.  PMH:   Past Medical History:  Diagnosis Date   Arthritis    Cellulitis 03/14/2019   feet   CKD (chronic kidney disease)    Diabetes mellitus without complication (HCC)    Type II   End-stage kidney disease (HCC) 03/14/2019   was on hemodialysis ~ June 2021 - March 2022 when he discontinued; followed by Dr. Zetta Bills   GERD (gastroesophageal reflux disease)    Heart murmur    History of blood transfusion    Hypertension    12/30/19- having low blood pressure since beginning   Irregular heart beat    UTI (urinary tract infection)     PSH:   Past Surgical History:  Procedure Laterality Date   arm surgery Right    was cut to the bone   BASCILIC VEIN TRANSPOSITION Left 11/18/2019   Procedure: FIRST STAGE LEFT ARM BASCILIC VEIN TRANSPOSITION;  Surgeon: Nada Libman, MD;  Location: [facility] OR;  Service: Vascular;  Laterality: Left;   BASCILIC VEIN TRANSPOSITION Left 01/04/2020   Procedure: LEFT  SECOND STAGE BASCILIC VEIN TRANSPOSITION;  Surgeon: Nada Libman, MD;  Location: [facility] OR;  Service: Vascular;  Laterality: Left;   COLONOSCOPY  06/2019   DIRECT LARYNGOSCOPY Bilateral 08/06/2022    Procedure: DIRECT LARYNGOSCOPY WITH BIOPSY;  Surgeon: Christia Reading, MD;  Location: [facility] OR;  Service: ENT;  Laterality: Bilateral;   EYE SURGERY Bilateral    cataract   NASOPHARYNGEAL BIOPSY Left 08/06/2022   Procedure: NASOPHARYNGEAL BIOPSY;  Surgeon: Christia Reading, MD;  Location: [facility] OR;  Service: ENT;  Laterality: Left;   TONSILLECTOMY Left 08/06/2022   Procedure: TONSILLECTOMY;  Surgeon: Christia Reading, MD;  Location: [facility] OR;  Service: ENT;  Laterality: Left;   TRANSURETHRAL RESECTION OF PROSTATE N/A 08/08/2019   Procedure: TRANSURETHRAL RESECTION OF THE PROSTATE (TURP);  Surgeon: Crista Elliot, MD;  Location: [facility] ORS;  Service: Urology;  Laterality: N/A;   UPPER GASTROINTESTINAL ENDOSCOPY  06/2019   WISDOM TOOTH EXTRACTION      Allergies:  Allergies  Allergen Reactions   Hydrocodone Hives and Itching   Percocet [Oxycodone-Acetaminophen] Hives and Itching    Medications:   Prior to Admission medications   Medication Sig Start Date End Date Taking? Authorizing Provider  acetaminophen (TYLENOL) 500 MG tablet Take 1,000 mg by mouth every 6 (six) hours as needed for headache (pain).    Yes [provider]  amLODipine (NORVASC) 5 MG tablet Take 1 tablet (5 mg total) by mouth at bedtime. 10/22/22  Yes Latrelle Dodrill, MD  carvedilol (COREG) 25 MG tablet Take 25 mg by mouth 2 (two) times daily with a meal.   Yes [provider]  Cholecalciferol (VITAMIN D3) 125 MCG (5000 UT) CAPS Take 5,000 Units by mouth daily.   Yes [provider]  esomeprazole (NEXIUM) 40 MG capsule TAKE 1 CAPSULE BY MOUTH ONCE DAILY AT  NOON Patient taking differently: Take 40 mg by mouth daily as needed (for heartburn). 08/23/21  Yes Mansouraty, Netty Starring., MD  gabapentin (NEURONTIN) 300 MG capsule Take 1 capsule (300 mg total) by mouth at bedtime. 01/12/23  Yes Latrelle Dodrill, MD  LANTUS SOLOSTAR 100 UNIT/ML Solostar Pen Inject 6 units subcutaneously once daily 01/21/23  Yes Latrelle Dodrill, MD  loperamide (IMODIUM A-D) 2 MG tablet Take 1 tablet (2 mg total) by mouth 4 (four) times daily as needed for diarrhea or loose stools. 06/12/20  Yes Latrelle Dodrill, MD  mupirocin ointment (BACTROBAN) 2 % Apply 1 Application topically 2 (two) times daily. Left ear canal   Yes [provider]  sodium bicarbonate 650 MG tablet Take 650 mg by mouth 2 (two) times daily.   Yes [provider]  tamsulosin (FLOMAX) 0.4 MG CAPS capsule Take 0.4 mg by mouth daily.   Yes [provider]  traMADol (ULTRAM) 50 MG tablet Take 1 tablet (50 mg total) by mouth every 6 (six) hours as needed. Patient taking differently: Take 50 mg by mouth every 6 (six) hours as needed for moderate pain (pain score 4-6). 02/26/22  Yes Latrelle Dodrill, MD  trimethoprim (TRIMPEX) 100 MG tablet Take 100 mg by mouth daily. 01/08/22  Yes [provider]  VELTASSA 8.4 g packet Take 1 packet by mouth daily.   Yes [provider]  blood glucose meter kit and supplies KIT Dispense based on patient and insurance preference. Use up to four times daily as directed. (FOR ICD-9 250.00, 250.01). 03/28/19   Hongalgi, Maximino Greenland, MD  Insulin Syringes, Disposable, U-100 0.5 ML MISC  Use as per instructions 3 times daily AC. 03/28/19   Hongalgi, Maximino Greenland, MD    Discontinued Meds:   Medications Discontinued During This Encounter  Medication Reason   sodium chloride 0.9 % bolus 500 mL    hydrocortisone 2.5 % cream Patient Preference   famotidine (PEPCID) 20 MG tablet Patient Preference   sodium zirconium cyclosilicate (LOKELMA) packet 10 g     Social History:  reports that he quit smoking about 6 months ago. His smoking use included cigarettes. He started smoking about 54 years ago. He has a 25.6 pack-year smoking history. He has never used smokeless tobacco. He reports that he does not currently use alcohol. He reports that he does not use drugs.  Family History:   Family History   Problem Relation Age of Onset   Diabetes type II Mother    Lung cancer Mother    Hypertension Mother    Diabetes type II Father    Diabetes type II Sister    Diabetes type II Brother    Hypertension Brother    Other Son        burned   CAD Neg Hx    Colon cancer Neg Hx    Esophageal cancer Neg Hx    Inflammatory bowel disease Neg Hx    Liver disease Neg Hx    Pancreatic cancer Neg Hx    Rectal cancer Neg Hx    Stomach cancer Neg Hx     Blood pressure 115/66, pulse 80, temperature 97.9 F (36.6 C), temperature source Axillary, resp. rate 13, height 6\' 3"  (1.905 m), weight 76.3 kg, SpO2 96%. 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

## 2023-04-30 NOTE — Progress Notes (Signed)
   04/30/23 0640  Vitals  BP Location Right Arm  BP Method Automatic  Patient Position (if appropriate) Lying  Pulse Rate 80  Pulse Rate Source Monitor  ECG Heart Rate 79  Resp 13  Oxygen Therapy  SpO2 96 %  During Treatment Monitoring  Blood Flow Rate (mL/min) 0 mL/min  Arterial Pressure (mmHg) -1.41 mmHg  Venous Pressure (mmHg) 3.43 mmHg  TMP (mmHg) -46.06 mmHg  Ultrafiltration Rate (mL/min) 276 mL/min  Dialysate Flow Rate (mL/min) 300 ml/min  Dialysate Potassium Concentration 2  Dialysate Calcium Concentration 2.5  Duration of HD Treatment -hour(s) 2.17 hour(s)  Cumulative Fluid Removed (mL) per Treatment  0.05  HD Safety Checks Performed Yes  Intra-Hemodialysis Comments Tx completed  Post Treatment  Dialyzer Clearance Lightly streaked  Liters Processed 32.8  Fluid Removed (mL) 0 mL  Tolerated HD Treatment Yes  Post-Hemodialysis Comments pt stable4  AVG/AVF Arterial Site Held (minutes) 10 minutes  AVG/AVF Venous Site Held (minutes) 10 minutes  Fistula / Graft Left Forearm Arteriovenous fistula  Placement Date/Time: 11/18/19 0847   Orientation: Left  Access Location: Forearm  Access Type: Arteriovenous fistula  Site Condition No complications  Fistula / Graft Assessment Present;Thrill;Bruit  Status Deaccessed  Needle Size 16  Drainage Description None   Fistula is short, 16 g used. Pt treatment uneventful. No fluid removal.

## 2023-04-30 NOTE — Consult Note (Signed)
WOC Nurse Consult Note: Reason for Consult: Stage 2 buttocks Wound type: excoriation, noted in several place that patient is ESRD and had become extremity pruritic at home due to CKD.  Pressure Injury POA: NA Measurement: see nursing flow sheets Wound bed: liner scratch marks with some surrounding redness. Not on a bony prominence  Drainage (amount, consistency, odor) none documented Periwound: intact  Dressing procedure/placement/frequency: Cleanse with saline, cover with single layer of xeroform and top with foam.  Orders updated.  Re consult if needed, will not follow at this time. Thanks  Skarlette Lattner [employer], RN,CWOCN, CNS, CWON-AP [phone])

## 2023-04-30 NOTE — Progress Notes (Signed)
Family Medicine Teaching Service PCP Social Visit  Stopped by to visit Smitty Cords and Bjorn Loser in [facility] hospital room. He is currently undergoing CRRT for his acute on chronic renal failure. Shared supportive words with them and advised when he is ready to come out of the ICU, our FM team will take over his care. They were appreciative of the visit.  Levert Feinstein, MD, [facility]

## 2023-04-30 NOTE — Procedures (Addendum)
Central Venous Catheter Insertion Procedure Note  Louis Butler  [id number]  [birth year]  Date:04/30/23  Time:9:48 AM   Provider Performing:Mika Anastasi D [name]   Procedure: Insertion of Non-tunneled Central Venous [phone]) with US guidance ([id number])    Indication(s) Hemodialysis  Consent Risks of the procedure as well as the alternatives and risks of each were explained to the patient and/or caregiver.  Consent for the procedure was obtained and is signed in the bedside chart  Anesthesia Topical only with 1% lidocaine   Timeout Verified patient identification, verified procedure, site/side was marked, verified correct patient position, special equipment/implants available, medications/allergies/relevant history reviewed, required imaging and test results available.  Sterile Technique Maximal sterile technique including full sterile barrier drape, hand hygiene, sterile gown, sterile gloves, mask, hair covering, sterile ultrasound probe cover (if used).  Procedure Description Area of catheter insertion was cleaned with chlorhexidine and draped in sterile fashion.  With real-time ultrasound guidance a HD catheter was placed into the right internal jugular vein. Nonpulsatile blood flow and easy flushing noted in all ports.  The catheter was sutured in place and sterile dressing applied.  Complications/Tolerance None; patient tolerated the procedure well. Chest X-ray is ordered to verify placement for internal jugular or subclavian cannulation.   Chest x-ray is not ordered for femoral cannulation.  EBL Minimal  Specimen(s) None  [name] 04/30/2023, 9:50 AM  Please see [email or web address] for pager details.  From 7A-7P if no response, please call [phone]. After hours, please call ELink [phone].

## 2023-05-01 DIAGNOSIS — I9581 Postprocedural hypotension: Secondary | ICD-10-CM

## 2023-05-01 DIAGNOSIS — E440 Moderate protein-calorie malnutrition: Secondary | ICD-10-CM | POA: Diagnosis present

## 2023-05-01 DIAGNOSIS — R6521 Severe sepsis with septic shock: Secondary | ICD-10-CM | POA: Diagnosis not present

## 2023-05-01 DIAGNOSIS — A419 Sepsis, unspecified organism: Secondary | ICD-10-CM | POA: Diagnosis not present

## 2023-05-01 DIAGNOSIS — N179 Acute kidney failure, unspecified: Secondary | ICD-10-CM | POA: Diagnosis not present

## 2023-05-01 LAB — RENAL FUNCTION PANEL
Albumin: 2.5 g/dL — ABNORMAL LOW (ref 3.5–5.0)
Albumin: 2.4 g/dL — ABNORMAL LOW (ref 3.5–5.0)
Anion gap: 12 (ref 5–15)
Anion gap: 14 (ref 5–15)
BUN: 58 mg/dL — ABNORMAL HIGH (ref 8–23)
BUN: 44 mg/dL — ABNORMAL HIGH (ref 8–23)
CO2: 21 mmol/L — ABNORMAL LOW (ref 22–32)
CO2: 20 mmol/L — ABNORMAL LOW (ref 22–32)
Calcium: 7.5 mg/dL — ABNORMAL LOW (ref 8.9–10.3)
Calcium: 7.5 mg/dL — ABNORMAL LOW (ref 8.9–10.3)
Chloride: 101 mmol/L (ref 98–111)
Chloride: 99 mmol/L (ref 98–111)
Creatinine, Ser: 7.13 mg/dL — ABNORMAL HIGH (ref 0.61–1.24)
Creatinine, Ser: 6.28 mg/dL — ABNORMAL HIGH (ref 0.61–1.24)
GFR, Estimated: 8 mL/min — ABNORMAL LOW (ref >60)
GFR, Estimated: 9 mL/min — ABNORMAL LOW (ref >60)
Glucose, Bld: 144 mg/dL — ABNORMAL HIGH (ref 70–99)
Glucose, Bld: 154 mg/dL — ABNORMAL HIGH (ref 70–99)
Phosphorus: 3.5 mg/dL (ref 2.5–4.6)
Phosphorus: 3 mg/dL (ref 2.5–4.6)
Potassium: 4.6 mmol/L (ref 3.5–5.1)
Potassium: 4.3 mmol/L (ref 3.5–5.1)
Sodium: 134 mmol/L — ABNORMAL LOW (ref 135–145)
Sodium: 133 mmol/L — ABNORMAL LOW (ref 135–145)

## 2023-05-01 LAB — MAGNESIUM: Magnesium: 2.2 mg/dL (ref 1.7–2.4)

## 2023-05-01 LAB — CBC
HCT: 24.6 % — ABNORMAL LOW (ref 39.0–52.0)
Hemoglobin: 8.3 g/dL — ABNORMAL LOW (ref 13.0–17.0)
MCH: 29.7 pg (ref 26.0–34.0)
MCHC: 33.7 g/dL (ref 30.0–36.0)
MCV: 88.2 fL (ref 80.0–100.0)
Platelets: 140 10*3/uL — ABNORMAL LOW (ref 150–400)
RBC: 2.79 MIL/uL — ABNORMAL LOW (ref 4.22–5.81)
RDW: 13 % (ref 11.5–15.5)
WBC: 8.3 10*3/uL (ref 4.0–10.5)
nRBC: 0 % (ref 0.0–0.2)

## 2023-05-01 LAB — GLUCOSE, CAPILLARY
Glucose-Capillary: 104 mg/dL — ABNORMAL HIGH (ref 70–99)
Glucose-Capillary: 119 mg/dL — ABNORMAL HIGH (ref 70–99)
Glucose-Capillary: 124 mg/dL — ABNORMAL HIGH (ref 70–99)
Glucose-Capillary: 134 mg/dL — ABNORMAL HIGH (ref 70–99)
Glucose-Capillary: 145 mg/dL — ABNORMAL HIGH (ref 70–99)
Glucose-Capillary: 148 mg/dL — ABNORMAL HIGH (ref 70–99)
Glucose-Capillary: 185 mg/dL — ABNORMAL HIGH (ref 70–99)

## 2023-05-01 LAB — PTH, INTACT AND CALCIUM
Calcium, Total (PTH): 6.5 mg/dL — CL (ref 8.6–10.2)
PTH: 203 pg/mL — ABNORMAL HIGH (ref 15–65)

## 2023-05-01 LAB — HEPATITIS B SURFACE ANTIBODY, QUANTITATIVE: Hep B S AB Quant (Post): 7590 m[IU]/mL

## 2023-05-01 MED ORDER — MIDODRINE HCL 5 MG PO TABS
10.0000 mg | ORAL_TABLET | Freq: Three times a day (TID) | ORAL | Status: DC
  Administered 2023-05-01: 10 mg via ORAL
  Filled 2023-05-01: qty 2

## 2023-05-01 MED ORDER — MIDODRINE HCL 5 MG PO TABS
5.0000 mg | ORAL_TABLET | Freq: Three times a day (TID) | ORAL | Status: DC
  Administered 2023-05-01: 5 mg via ORAL
  Filled 2023-05-01: qty 1

## 2023-05-01 MED ORDER — MELATONIN 3 MG PO TABS: 3.0000 mg | ORAL_TABLET | Freq: Once | ORAL | Status: DC

## 2023-05-01 MED ORDER — BOOST / RESOURCE BREEZE PO LIQD CUSTOM
1.0000 | Freq: Two times a day (BID) | ORAL | Status: DC
  Administered 2023-05-02 – 2023-05-09 (×6): 1 via ORAL

## 2023-05-01 MED ORDER — RENA-VITE PO TABS
1.0000 | ORAL_TABLET | Freq: Every day | ORAL | Status: DC
  Administered 2023-05-01 – 2023-06-12 (×43): 1 via ORAL
  Filled 2023-05-01 (×43): qty 1

## 2023-05-01 NOTE — Progress Notes (Signed)
Requested to see pt for out-pt HD needs at d/c. Spoke to pt's wife via phone. Introduced self and explained role. Discussed providers recs for out-pt HD for pt at d/c. Wife states that pt is currently agreeable to out-pt HD and hopeful that pt will attend treatments at d/c. Discussed out-pt HD clinic options. Wife states pt sees Dr Allena Katz with [facility] and pt would do best if able to stay under his care at d/c. Explained that Dr Allena Katz sees pts at [facility]. Wife states she would be willing to transport pt from [place] to [place] if pt able to continue with Dr Allena Katz at [facility] clinic. Referral submitted to [facility] admissions for [facility] clinic per pt's wife's request. While speaking to pt's wife, she proceeds to voice some concerns to navigator regarding a comment that pt made to her this morning. [name] wife states that pt told her this morning that if/when he goes home, he may kill himself. Inquired of pt's wife if she had made any of the medical staff aware of pt's comment. Wife states she was not able to speak with any of the providers alone after pt made statement. Wife requested that navigator please contact providers to make them aware of pt's comment and her concerns. Navigator contacted attending, nephrologist,and pt's RN with wife's concern regarding pt's statement. Will follow and assist with out-pt HD placement.   Olivia Canter Renal Navigator [phone]

## 2023-05-01 NOTE — Progress Notes (Signed)
Received message from CSW in that wife relayed concerns of SI for her husband on prior phone call to set up with outpt dialysis.  See CSW note for details, but wife reported that pt told her today he may kill himself when he goes home.  Wife stated she was unable to relay to staff when here.  Attempted to call wife for further clarification but no answer, VM left.  Nothing mentioned on rounds previously today when talking to wife.   Pt at this time denies any thoughts or plans for SI.  States if he was planning on hurting himself, why would he have agreed to continue dialysis and full care earlier today. Does admit to being "down" given current circumstances of having to restart dialysis but "that's life".  Does not wish to take medicines or talk with anyone at this time.  No current concerns for self harm at this time based on discussions with pt, witnessed by bedside RN.      Posey Boyer, MSN, AG-ACNP-BC [facility] 05/01/2023, 2:46 PM  See Amion for pager If no response to pager , please call [phone] until 7pm After 7:00 pm call Elink  [phone]

## 2023-05-01 NOTE — Progress Notes (Signed)
FATE CASTER is an 68 y.o. male with CKD 5 (f/b Dr. Ricard Dillon), DM, HTN, recent squamous cell ca H&N (surgery, XRT 2024), anemia who presented with N/V/abd pain and was found to have marked hyperkalemia and renal failure for which nephrology is consulted.  Was on HD 2021-06/2020 when he self discontinued and has followed in clinic since.  Last OV with Dr. Allena Katz was 02/19/24 - GFR11, Hb 9.6.  Pt unclear if he would be accepting of long term dialysis.    Assessment/Plan: 1) CKD 5 with AKI vs progression to ESRD:  Longstanding advanced CKD, was on dialysis 2022 but stopped and has followed with Dr. Allena Katz since; most recent GFR 02/2023 11. By history seems patient may have had progression of CKD which led to his acute presentation though there is some concern for acute GI illness potentiating AKI.  Either way emergent dialysis is indicated now to which he is agreeable.    - Tolerated iHD evening of 1/22 but only 2 hours @ low efficiency given very high risk for dialysis states disequilibrium; appreciate CCV placing temp cath. Good enough clearance now and will rinse back. Plan on 3hr lower efficiency dialysis tomorrow.  CLIP process started; he is ESRD.  He is much more lucid and willing to be on dialysis again but does not want to go to [facility].  2) Hyperkalemia: CRRT now; had med management in ED -> better.    3) Hyponatremia: suspect hypovolemic, IVF for now; CRRT will also help correct.    4) HTN: on coreg baseline, hold in setting of marginal BP   5) AGMA: in setting of severe AKI, defer VBG to PCCM; can give bicarb pushes PRN, CRRT will correct.    6) Anemia: Hb 9.5, add iron indices.  Would clarify his H&N ca status prior to ESA administration.     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 Has rec'd IV ca supplement.    Subjective: Denies fever chills sob. Oriented to place and time   Chemistry and CBC: Creatinine, Ser  Date/Time Value Ref Range Status  05/01/2023  04:43 AM 7.13 (H) 0.61 - 1.24 mg/dL Final  09/81/1914 78:29 PM 10.02 (H) 0.61 - 1.24 mg/dL Final  56/21/3086 57:84 PM 12.45 (H) 0.61 - 1.24 mg/dL Final    Comment:    DIALYSIS RESULTS CHECKED   04/30/2023 04:12 AM 26.68 (H) 0.61 - 1.24 mg/dL Final    Comment:    RESULT CONFIRMED BY MANUAL DILUTION  04/30/2023 12:00 AM 26.60 (H) 0.61 - 1.24 mg/dL Corrected    Comment:    RESULT CONFIRMED BY MANUAL DILUTION CORRECTED RESULTS CALLED TO: M.MEJIA 0046 04/30/2023 BY G.GANADEN CORRECTED ON 01/23 AT 0046: PREVIOUSLY REPORTED AS 26.10 RESULT CONFIRMED BY MANUAL DILUTION   04/29/2023 05:44 PM 30.39 (H) 0.61 - 1.24 mg/dL Final    Comment:    RESULTS CONFIRMED BY MANUAL DILUTION  08/06/2022 09:07 AM 5.58 (H) 0.61 - 1.24 mg/dL Final  69/62/9528 41:32 PM 5.03 (H) 0.76 - 1.27 mg/dL Final  44/04/270 53:66 PM 5.07 (H) 0.76 - 1.27 mg/dL Final  44/06/4740 59:56 PM 5.09 (H) 0.76 - 1.27 mg/dL Final  38/75/6433 29:51 AM 4.22 (H) 0.76 - 1.27 mg/dL Final  88/41/6606 30:16 PM 4.57 (H) 0.76 - 1.27 mg/dL Final  04/15/3233 57:32 PM 3.42 (H) 0.61 - 1.24 mg/dL Final  20/25/4270 62:37 AM 3.60 (H) 0.61 - 1.24 mg/dL Final  62/83/1517 61:60 AM 3.20 (H) 0.61 - 1.24 mg/dL Final  73/71/0626  05:23 AM 4.47 (H) 0.61 - 1.24 mg/dL Final  16/01/9603 54:09 PM 4.62 (H) 0.61 - 1.24 mg/dL Final  81/19/1478 29:56 PM 5.15 (H) 0.61 - 1.24 mg/dL Final  21/30/8657 84:69 PM 4.41 (H) 0.76 - 1.27 mg/dL Final  62/95/2841 32:44 AM 4.34 (H) 0.61 - 1.24 mg/dL Final  04/09/7251 66:44 AM 4.27 (H) 0.61 - 1.24 mg/dL Final  03/47/4259 56:38 AM 4.20 (H) 0.61 - 1.24 mg/dL Final  75/64/3329 51:88 AM 3.96 (H) 0.61 - 1.24 mg/dL Final  41/66/0630 16:01 PM 3.94 (H) 0.61 - 1.24 mg/dL Final  09/32/3557 32:20 AM 4.70 (H) 0.61 - 1.24 mg/dL Final  25/42/7062 37:62 AM 4.75 (H) 0.61 - 1.24 mg/dL Final  83/15/1761 60:73 AM 5.06 (H) 0.61 - 1.24 mg/dL Final  71/09/2692 85:46 AM 5.23 (H) 0.61 - 1.24 mg/dL Final  27/06/5007 38:18 AM 5.70 (H) 0.61 - 1.24  mg/dL Final  29/93/7169 67:89 AM 5.75 (H) 0.61 - 1.24 mg/dL Final  38/01/1750 02:58 AM 5.67 (H) 0.61 - 1.24 mg/dL Final  52/77/8242 35:36 AM 5.37 (H) 0.61 - 1.24 mg/dL Final  14/43/1540 08:67 AM 4.88 (H) 0.61 - 1.24 mg/dL Final  61/95/0932 67:12 AM 4.54 (H) 0.61 - 1.24 mg/dL Final  45/80/9983 38:25 AM 4.71 (H) 0.61 - 1.24 mg/dL Final  05/39/7673 41:93 AM 4.55 (H) 0.61 - 1.24 mg/dL Final  79/05/4095 35:32 AM 4.29 (H) 0.61 - 1.24 mg/dL Final  99/24/2683 41:96 AM 4.80 (H) 0.61 - 1.24 mg/dL Final  22/29/7989 21:19 PM 4.80 (H) 0.61 - 1.24 mg/dL Final  41/74/0814 48:18 AM 4.93 (H) 0.61 - 1.24 mg/dL Final  56/31/4970 26:37 PM 5.24 (H) 0.61 - 1.24 mg/dL Final  85/88/5027 74:12 AM 1.20 0.61 - 1.24 mg/dL Final  87/86/7672 09:47 PM 0.80 0.61 - 1.24 mg/dL Final  09/62/8366 29:47 AM 1.02 0.61 - 1.24 mg/dL Final  65/46/5035 46:56 AM 0.90 0.61 - 1.24 mg/dL Final  81/27/5170 01:74 AM 0.87 0.61 - 1.24 mg/dL Final  94/49/6759 16:38 AM 0.89 0.61 - 1.24 mg/dL Final  46/65/9935 70:17 PM 0.86 0.61 - 1.24 mg/dL Final  79/39/0300 92:33 AM 1.01 0.61 - 1.24 mg/dL Final   Recent Labs  Lab 04/29/23 1744 04/29/23 2016 04/30/23 0000 04/30/23 0411 04/30/23 0412 04/30/23 0858 04/30/23 1622 04/30/23 2224 05/01/23 0443  NA 126*  --  129*  --  124*  --  133* 132* 134*  K >7.5*  --  6.7* 5.4* 5.4* 3.9 4.5 4.7 4.6  CL 95*  --  96*  --  92*  --  97* 99 101  CO2 10*  --  12*  --  13*  --  20* 18* 21*  GLUCOSE 170* 280* 123*  --  144*  --  122* 164* 144*  BUN 260*  --  248*  --  258*  --  109* 83* 58*  CREATININE 30.39*  --  26.60*  --  26.68*  --  12.45* 10.02* 7.13*  CALCIUM 6.7*  --  6.8*  --  6.4*  --  7.7* 7.4* 7.5*  PHOS  --   --   --   --  >30.0*  --  5.5* 4.4 3.5   Recent Labs  Lab 04/29/23 1744 04/30/23 0410 05/01/23 0443  WBC 8.2 6.5 8.3  HGB 9.5* 8.2* 8.3*  HCT 28.6* 23.7* 24.6*  MCV 90.2 87.5 88.2  PLT 162 133* 140*   Liver Function Tests: Recent Labs  Lab 04/30/23 2052 04/30/23 2224  05/01/23 0443  AST 15  --   --  ALT 11  --   --   ALKPHOS 47  --   --   BILITOT 1.2  --   --   PROT 7.0  --   --   ALBUMIN 2.4* 2.5* 2.5*   No results for input(s): "LIPASE", "AMYLASE" in the last 168 hours. Recent Labs  Lab 04/30/23 0955  AMMONIA 17   Cardiac Enzymes: No results for input(s): "CKTOTAL", "CKMB", "CKMBINDEX", "TROPONINI" in the last 168 hours. Iron Studies:  Recent Labs    04/30/23 0410  IRON 128  TIBC 143*  FERRITIN 648*   PT/INR: @LABRCNTIP (inr:5)  Xrays/Other Studies: ) Results for orders placed or performed during the hospital encounter of 04/29/23 (from the past 48 hours)  CBG monitoring, ED     Status: Abnormal   Collection Time: 04/29/23  5:44 PM  Result Value Ref Range   Glucose-Capillary 164 (H) 70 - 99 mg/dL    Comment: Glucose reference range applies only to samples taken after fasting for at least 8 hours.  Basic metabolic panel     Status: Abnormal   Collection Time: 04/29/23  5:44 PM  Result Value Ref Range   Sodium 126 (L) 135 - 145 mmol/L   Potassium >7.5 (HH) 3.5 - 5.1 mmol/L    Comment: CRITICAL RESULT CALLED TO, READ BACK BY AND VERIFIED WITH P.BLANCHARD,RN @1842  04/29/2023 VANG.J REPEATED TO VERIFY    Chloride 95 (L) 98 - 111 mmol/L   CO2 10 (L) 22 - 32 mmol/L   Glucose, Bld 170 (H) 70 - 99 mg/dL    Comment: Glucose reference range applies only to samples taken after fasting for at least 8 hours.   BUN 260 (H) 8 - 23 mg/dL   Creatinine, Ser 16.10 (H) 0.61 - 1.24 mg/dL    Comment: RESULTS CONFIRMED BY MANUAL DILUTION   Calcium 6.7 (L) 8.9 - 10.3 mg/dL   GFR, Estimated 1 (L) >60 mL/min    Comment: (NOTE) Calculated using the CKD-EPI Creatinine Equation (2021)    Anion gap 21 (H) 5 - 15    Comment: ELECTROLYTES REPEATED TO VERIFY Performed at [facility], [address] [place], [place] [address]   CBC     Status: Abnormal   Collection Time: 04/29/23  5:44 PM  Result Value Ref Range   WBC 8.2 4.0 - 10.5 K/uL    RBC 3.17 (L) 4.22 - 5.81 MIL/uL   Hemoglobin 9.5 (L) 13.0 - 17.0 g/dL   HCT 40.9 (L) 81.1 - 91.4 %   MCV 90.2 80.0 - 100.0 fL   MCH 30.0 26.0 - 34.0 pg   MCHC 33.2 30.0 - 36.0 g/dL   RDW 78.2 95.6 - 21.3 %   Platelets 162 150 - 400 K/uL   nRBC 0.0 0.0 - 0.2 %    Comment: Performed at [facility], [address], [place], [place] [address]  Troponin I (High Sensitivity)     Status: None   Collection Time: 04/29/23  6:09 PM  Result Value Ref Range   Troponin I (High Sensitivity) 6 <18 ng/L    Comment: (NOTE) Elevated high sensitivity troponin I (hsTnI) values and significant  changes across serial measurements may suggest ACS but many other  chronic and acute conditions are known to elevate hsTnI results.  Refer to the "Links" section for chest pain algorithms and additional  guidance. Performed at [facility], [address], [place], [place] [address]   CBG monitoring, ED  Status: Abnormal   Collection Time: 04/29/23  7:36 PM  Result Value Ref Range   Glucose-Capillary 216 (H) 70 - 99 mg/dL    Comment: Glucose reference range applies only to samples taken after fasting for at least 8 hours.  Glucose, random     Status: Abnormal   Collection Time: 04/29/23  8:16 PM  Result Value Ref Range   Glucose, Bld 280 (H) 70 - 99 mg/dL    Comment: Glucose reference range applies only to samples taken after fasting for at least 8 hours. Performed at [facility], [address] [place], [place] [address]   Hepatitis B surface antigen     Status: None   Collection Time: 04/29/23  8:16 PM  Result Value Ref Range   Hepatitis B Surface Ag NON REACTIVE NON REACTIVE    Comment: Performed at [facility], [address] [place], [place] [address]  Hepatitis B surface antibody,quantitative     Status: None   Collection Time: 04/29/23  8:16 PM  Result Value Ref Range   Hep B S AB Quant (Post) 7,590.0 Immunity>10 mIU/mL    Comment: (NOTE) Results confirmed  on dilution.  Status of Immunity                     Anti-HBs Level  ------------------                     -------------- Inconsistent with Immunity                  0.0 - 10.0 Consistent with Immunity                         >10.0 Performed At: [facility] [address] [place], [place] [id number] Jolene Schimke MD [id number]   MRSA Next Gen by PCR, Nasal     Status: None   Collection Time: 04/29/23  9:20 PM   Specimen: Nasal Mucosa; Nasal Swab  Result Value Ref Range   MRSA by PCR Next Gen NOT DETECTED NOT DETECTED    Comment: (NOTE) The GeneXpert MRSA Assay (FDA approved for NASAL specimens only), is one component of a comprehensive MRSA colonization surveillance program. It is not intended to diagnose MRSA infection nor to guide or monitor treatment for MRSA infections. Test performance is not FDA approved in patients less than 31 years old. Performed at [facility], [address], [place], [place] [address]   Glucose, capillary     Status: Abnormal   Collection Time: 04/29/23  9:23 PM  Result Value Ref Range   Glucose-Capillary 159 (H) 70 - 99 mg/dL    Comment: Glucose reference range applies only to samples taken after fasting for at least 8 hours.  APTT     Status: None   Collection Time: 04/29/23  9:34 PM  Result Value Ref Range   aPTT 27 24 - 36 seconds    Comment: Performed at [facility], [address] [place], [place] [address]  Protime-INR     Status: Abnormal   Collection Time: 04/29/23  9:34 PM  Result Value Ref Range   Prothrombin Time 16.7 (H) 11.4 - 15.2 seconds   INR 1.3 (H) 0.8 - 1.2    Comment: (NOTE) INR goal varies based on device and disease states. Performed at [facility], [address], [place], [place] [address]   Basic metabolic panel  Status: Abnormal   Collection Time: 04/30/23 12:00 AM  Result Value Ref Range   Sodium 129 (L) 135 - 145 mmol/L   Potassium 6.7 (HH) 3.5 - 5.1 mmol/L    Comment:  CRITICAL RESULT CALLED TO, READ BACK BY AND VERIFIED WITH M.MEJIA RN 0045 04/30/2023 BY G.GANADEN   Chloride 96 (L) 98 - 111 mmol/L   CO2 12 (L) 22 - 32 mmol/L   Glucose, Bld 123 (H) 70 - 99 mg/dL    Comment: Glucose reference range applies only to samples taken after fasting for at least 8 hours.   BUN 248 (H) 8 - 23 mg/dL   Creatinine, Ser 65.78 (H) 0.61 - 1.24 mg/dL    Comment: RESULT CONFIRMED BY MANUAL DILUTION CORRECTED RESULTS CALLED TO: M.MEJIA 0046 04/30/2023 BY G.GANADEN CORRECTED ON 01/23 AT 0046: PREVIOUSLY REPORTED AS 26.10 RESULT CONFIRMED BY MANUAL DILUTION    Calcium 6.8 (L) 8.9 - 10.3 mg/dL   GFR, Estimated 2 (L) >60 mL/min    Comment: (NOTE) Calculated using the CKD-EPI Creatinine Equation (2021)    Anion gap 21 (H) 5 - 15    Comment: ELECTROLYTES REPEATED TO VERIFY Performed at [facility], [address], [place], [place] [address]   Urinalysis, Routine w reflex microscopic -Urine, Clean Catch     Status: Abnormal   Collection Time: 04/30/23 12:05 AM  Result Value Ref Range   Color, Urine YELLOW YELLOW   APPearance CLOUDY (A) CLEAR   Specific Gravity, Urine 1.012 1.005 - 1.030   pH 6.0 5.0 - 8.0   Glucose, UA NEGATIVE NEGATIVE mg/dL   Hgb urine dipstick LARGE (A) NEGATIVE   Bilirubin Urine NEGATIVE NEGATIVE   Ketones, ur 5 (A) NEGATIVE mg/dL   Protein, ur 952 (A) NEGATIVE mg/dL   Nitrite NEGATIVE NEGATIVE   Leukocytes,Ua LARGE (A) NEGATIVE   RBC / HPF >50 0 - 5 RBC/hpf   WBC, UA >50 0 - 5 WBC/hpf   Bacteria, UA NONE SEEN NONE SEEN   Squamous Epithelial / HPF 0-5 0 - 5 /HPF   WBC Clumps PRESENT     Comment: Performed at [facility], [address], [place], [place] [address]  Glucose, capillary     Status: Abnormal   Collection Time: 04/30/23  3:19 AM  Result Value Ref Range   Glucose-Capillary 151 (H) 70 - 99 mg/dL    Comment: Glucose reference range applies only to samples taken after fasting for at least 8 hours.  Hemoglobin A1c      Status: Abnormal   Collection Time: 04/30/23  4:10 AM  Result Value Ref Range   Hgb A1c MFr Bld 6.8 (H) 4.8 - 5.6 %    Comment: (NOTE) Pre diabetes:          5.7%-6.4%  Diabetes:              >6.4%  Glycemic control for   <7.0% adults with diabetes    Mean Plasma Glucose 148.46 mg/dL    Comment: Performed at [facility], [address] [place], [place] [address]  Ferritin     Status: Abnormal   Collection Time: 04/30/23  4:10 AM  Result Value Ref Range   Ferritin 648 (H) 24 - 336 ng/mL    Comment: Performed at [facility], [address] [place], [place] [address]  Iron and TIBC     Status: Abnormal   Collection Time: 04/30/23  4:10 AM  Result Value Ref Range  Iron 128 45 - 182 ug/dL   TIBC 409 (L) 811 - 914 ug/dL   Saturation Ratios 90 (H) 17.9 - 39.5 %   UIBC 15 ug/dL    Comment: Performed at [facility], [address], [place], [place] [address]  Magnesium     Status: Abnormal   Collection Time: 04/30/23  4:10 AM  Result Value Ref Range   Magnesium 2.5 (H) 1.7 - 2.4 mg/dL    Comment: Performed at [facility], [address] [place], [place] [address]  Lipid panel     Status: Abnormal   Collection Time: 04/30/23  4:10 AM  Result Value Ref Range   Cholesterol 70 0 - 200 mg/dL   Triglycerides 77 <865 mg/dL   HDL 21 (L) >78 mg/dL   Total CHOL/HDL Ratio 3.3 RATIO   VLDL 15 0 - 40 mg/dL   LDL Cholesterol 34 0 - 99 mg/dL    Comment:        Total Cholesterol/HDL:CHD Risk Coronary Heart Disease Risk Table                     Men   Women  1/2 Average Risk   3.4   3.3  Average Risk       5.0   4.4  2 X Average Risk   9.6   7.1  3 X Average Risk  23.4   11.0        Use the calculated Patient Ratio above and the CHD Risk Table to determine the patient's CHD Risk.        ATP III CLASSIFICATION (LDL):  <100     mg/dL   Optimal  469-629  mg/dL   Near or Above                    Optimal  130-159  mg/dL   Borderline  528-413  mg/dL   High   >244     mg/dL   Very High Performed at [facility], [address], [place], [place] [address]   T4, free     Status: None   Collection Time: 04/30/23  4:10 AM  Result Value Ref Range   Free T4 0.88 0.61 - 1.12 ng/dL    Comment: (NOTE) Biotin ingestion may interfere with free T4 tests. If the results are inconsistent with the TSH level, previous test results, or the clinical presentation, then consider biotin interference. If needed, order repeat testing after stopping biotin. Performed at [facility], [address] [place], [place] [address]   CBC     Status: Abnormal   Collection Time: 04/30/23  4:10 AM  Result Value Ref Range   WBC 6.5 4.0 - 10.5 K/uL   RBC 2.71 (L) 4.22 - 5.81 MIL/uL   Hemoglobin 8.2 (L) 13.0 - 17.0 g/dL   HCT 44.0 (L) 34.7 - 42.5 %   MCV 87.5 80.0 - 100.0 fL   MCH 30.3 26.0 - 34.0 pg   MCHC 34.6 30.0 - 36.0 g/dL   RDW 95.6 38.7 - 56.4 %   Platelets 133 (L) 150 - 400 K/uL   nRBC 0.0 0.0 - 0.2 %    Comment: Performed at [facility], [address], [place], [place] [address]  Vitamin B12     Status: None   Collection Time: 04/30/23  4:10 AM  Result Value Ref Range   Vitamin B-12 765 180 - 914  pg/mL    Comment: (NOTE) This assay is not validated for testing neonatal or myeloproliferative syndrome specimens for Vitamin B12 levels. Performed at [facility], [address] [place], [place] [address]   TSH     Status: None   Collection Time: 04/30/23  4:11 AM  Result Value Ref Range   TSH 1.034 0.350 - 4.500 uIU/mL    Comment: Performed by a 3rd Generation assay with a functional sensitivity of <=0.01 uIU/mL. Performed at [facility], [address], [place], [place] [address]   Folate     Status: None   Collection Time: 04/30/23  4:11 AM  Result Value Ref Range   Folate 9.8 >5.9 ng/mL    Comment: Performed at [facility], [address] [place], [place] [address]  Potassium     Status: Abnormal    Collection Time: 04/30/23  4:11 AM  Result Value Ref Range   Potassium 5.4 (H) 3.5 - 5.1 mmol/L    Comment: Performed at [facility], [address], [place], [place] [address]  Renal function panel     Status: Abnormal   Collection Time: 04/30/23  4:12 AM  Result Value Ref Range   Sodium 124 (L) 135 - 145 mmol/L   Potassium 5.4 (H) 3.5 - 5.1 mmol/L   Chloride 92 (L) 98 - 111 mmol/L   CO2 13 (L) 22 - 32 mmol/L   Glucose, Bld 144 (H) 70 - 99 mg/dL    Comment: Glucose reference range applies only to samples taken after fasting for at least 8 hours.   BUN 258 (H) 8 - 23 mg/dL   Creatinine, Ser 59.56 (H) 0.61 - 1.24 mg/dL    Comment: RESULT CONFIRMED BY MANUAL DILUTION   Calcium 6.4 (LL) 8.9 - 10.3 mg/dL    Comment: CRITICAL RESULT CALLED TO, READ BACK BY AND VERIFIED WITH E.MEJIA  RN [phone] 04/30/2023 BY G.GANADEN   Phosphorus >30.0 (H) 2.5 - 4.6 mg/dL    Comment: RESULT CONFIRMED BY MANUAL DILUTION   Albumin 2.8 (L) 3.5 - 5.0 g/dL   GFR, Estimated 2 (L) >60 mL/min    Comment: (NOTE) Calculated using the CKD-EPI Creatinine Equation (2021)    Anion gap 19 (H) 5 - 15    Comment: Performed at [facility], [address], [place], [place] [address]  Glucose, capillary     Status: None   Collection Time: 04/30/23  7:41 AM  Result Value Ref Range   Glucose-Capillary 95 70 - 99 mg/dL    Comment: Glucose reference range applies only to samples taken after fasting for at least 8 hours.  Potassium     Status: None   Collection Time: 04/30/23  8:58 AM  Result Value Ref Range   Potassium 3.9 3.5 - 5.1 mmol/L    Comment: Performed at [facility], [address], [place], [place] [address]  Ammonia     Status: None   Collection Time: 04/30/23  9:55 AM  Result Value Ref Range   Ammonia 17 9 - 35 umol/L    Comment: Performed at [facility], [address], [place], [place] [address]  Glucose, capillary     Status: None   Collection Time: 04/30/23 12:01 PM  Result  Value Ref Range   Glucose-Capillary 85 70 - 99 mg/dL    Comment: Glucose reference range applies only to samples taken after fasting for at least 8 hours.  Glucose,  capillary     Status: Abnormal   Collection Time: 04/30/23  3:51 PM  Result Value Ref Range   Glucose-Capillary 105 (H) 70 - 99 mg/dL    Comment: Glucose reference range applies only to samples taken after fasting for at least 8 hours.  Renal function panel (daily at 1600)     Status: Abnormal   Collection Time: 04/30/23  4:22 PM  Result Value Ref Range   Sodium 133 (L) 135 - 145 mmol/L    Comment: DELTA CHECK NOTED   Potassium 4.5 3.5 - 5.1 mmol/L   Chloride 97 (L) 98 - 111 mmol/L   CO2 20 (L) 22 - 32 mmol/L   Glucose, Bld 122 (H) 70 - 99 mg/dL    Comment: Glucose reference range applies only to samples taken after fasting for at least 8 hours.   BUN 109 (H) 8 - 23 mg/dL   Creatinine, Ser 78.46 (H) 0.61 - 1.24 mg/dL    Comment: DIALYSIS RESULTS CHECKED    Calcium 7.7 (L) 8.9 - 10.3 mg/dL   Phosphorus 5.5 (H) 2.5 - 4.6 mg/dL   Albumin 2.6 (L) 3.5 - 5.0 g/dL   GFR, Estimated 4 (L) >60 mL/min    Comment: (NOTE) Calculated using the CKD-EPI Creatinine Equation (2021)    Anion gap 16 (H) 5 - 15    Comment: Performed at [facility], [address] [place], [place] [address]  Glucose, capillary     Status: Abnormal   Collection Time: 04/30/23  8:06 PM  Result Value Ref Range   Glucose-Capillary 122 (H) 70 - 99 mg/dL    Comment: Glucose reference range applies only to samples taken after fasting for at least 8 hours.  Hepatic function panel     Status: Abnormal   Collection Time: 04/30/23  8:52 PM  Result Value Ref Range   Total Protein 7.0 6.5 - 8.1 g/dL   Albumin 2.4 (L) 3.5 - 5.0 g/dL   AST 15 15 - 41 U/L   ALT 11 0 - 44 U/L   Alkaline Phosphatase 47 38 - 126 U/L   Total Bilirubin 1.2 0.0 - 1.2 mg/dL   Bilirubin, Direct 0.3 (H) 0.0 - 0.2 mg/dL   Indirect Bilirubin 0.9 0.3 - 0.9 mg/dL    Comment:  Performed at [facility], [address], [place], [place] [address]  Renal function panel     Status: Abnormal   Collection Time: 04/30/23 10:24 PM  Result Value Ref Range   Sodium 132 (L) 135 - 145 mmol/L   Potassium 4.7 3.5 - 5.1 mmol/L   Chloride 99 98 - 111 mmol/L   CO2 18 (L) 22 - 32 mmol/L   Glucose, Bld 164 (H) 70 - 99 mg/dL    Comment: Glucose reference range applies only to samples taken after fasting for at least 8 hours.   BUN 83 (H) 8 - 23 mg/dL   Creatinine, Ser 24.40 (H) 0.61 - 1.24 mg/dL   Calcium 7.4 (L) 8.9 - 10.3 mg/dL   Phosphorus 4.4 2.5 - 4.6 mg/dL   Albumin 2.5 (L) 3.5 - 5.0 g/dL   GFR, Estimated 5 (L) >60 mL/min    Comment: (NOTE) Calculated using the CKD-EPI Creatinine Equation (2021)    Anion gap 15 5 - 15    Comment: Performed at [facility], [address], [place], [place] [address]  Glucose, capillary     Status: Abnormal   Collection Time: 05/01/23 12:01 AM  Result Value Ref Range   Glucose-Capillary 145 (H) 70 - 99 mg/dL    Comment: Glucose reference range applies only to samples taken after fasting for at least 8 hours.  Glucose, capillary     Status: Abnormal   Collection Time: 05/01/23  4:17 AM  Result Value Ref Range   Glucose-Capillary 185 (H) 70 - 99 mg/dL    Comment: Glucose reference range applies only to samples taken after fasting for at least 8 hours.  Renal function panel (daily at 0500)     Status: Abnormal   Collection Time: 05/01/23  4:43 AM  Result Value Ref Range   Sodium 134 (L) 135 - 145 mmol/L   Potassium 4.6 3.5 - 5.1 mmol/L   Chloride 101 98 - 111 mmol/L   CO2 21 (L) 22 - 32 mmol/L   Glucose, Bld 144 (H) 70 - 99 mg/dL    Comment: Glucose reference range applies only to samples taken after fasting for at least 8 hours.   BUN 58 (H) 8 - 23 mg/dL   Creatinine, Ser 1.61 (H) 0.61 - 1.24 mg/dL   Calcium 7.5 (L) 8.9 - 10.3 mg/dL   Phosphorus 3.5 2.5 - 4.6 mg/dL   Albumin 2.5 (L) 3.5 - 5.0 g/dL   GFR, Estimated 8  (L) >60 mL/min    Comment: (NOTE) Calculated using the CKD-EPI Creatinine Equation (2021)    Anion gap 12 5 - 15    Comment: Performed at [facility], [address] [place], [place] [address]  Magnesium     Status: None   Collection Time: 05/01/23  4:43 AM  Result Value Ref Range   Magnesium 2.2 1.7 - 2.4 mg/dL    Comment: Performed at [facility], [address], [place], [place] [address]  CBC     Status: Abnormal   Collection Time: 05/01/23  4:43 AM  Result Value Ref Range   WBC 8.3 4.0 - 10.5 K/uL   RBC 2.79 (L) 4.22 - 5.81 MIL/uL   Hemoglobin 8.3 (L) 13.0 - 17.0 g/dL   HCT 11.9 (L) 14.7 - 82.9 %   MCV 88.2 80.0 - 100.0 fL   MCH 29.7 26.0 - 34.0 pg   MCHC 33.7 30.0 - 36.0 g/dL   RDW 56.2 13.0 - 86.5 %   Platelets 140 (L) 150 - 400 K/uL   nRBC 0.0 0.0 - 0.2 %    Comment: Performed at [facility], [address] [place], [place] [address]  Glucose, capillary     Status: Abnormal   Collection Time: 05/01/23  7:51 AM  Result Value Ref Range   Glucose-Capillary 124 (H) 70 - 99 mg/dL    Comment: Glucose reference range applies only to samples taken after fasting for at least 8 hours.  Glucose, capillary     Status: Abnormal   Collection Time: 05/01/23 11:29 AM  Result Value Ref Range   Glucose-Capillary 148 (H) 70 - 99 mg/dL    Comment: Glucose reference range applies only to samples taken after fasting for at least 8 hours.   CT ABDOMEN PELVIS WO CONTRAST Result Date: 04/30/2023 CLINICAL DATA:  Hydronephrosis EXAM: CT ABDOMEN AND PELVIS WITHOUT CONTRAST TECHNIQUE: Multidetector CT imaging of the abdomen and pelvis was performed following the standard protocol without IV contrast. RADIATION DOSE REDUCTION: This exam was performed according to the departmental dose-optimization program which includes automated exposure control, adjustment of the mA and/or kV according to patient size and/or use of  iterative reconstruction technique. COMPARISON:  Renal  ultrasound 04/30/2023. PET-CT 12/05/2022. CT abdomen and pelvis 11/13/2021 FINDINGS: Lower chest: Patchy infiltrates in both lung bases likely representing multifocal pneumonia. Trace right pleural effusion. Hepatobiliary: Cholelithiasis. No gallbladder wall thickening or stranding. No bile duct dilatation. No focal liver lesions. Pancreas: Unremarkable. No pancreatic ductal dilatation or surrounding inflammatory changes. Spleen: Normal in size without focal abnormality. Adrenals/Urinary Tract: No adrenal gland nodules. Prominent bilateral hydronephrosis and hydroureter increasing since the previous study. The bladder is decompressed with a Foley catheter but there appears to be residual bladder wall thickening and there is stranding in the fat around the bladder which is similar to the prior study. No discrete ureteral stones are demonstrated suggesting that hydronephrosis and ureterectasis probably results from reflux or stasis. Cystitis and pyelonephritis could be present. Correlate with urinalysis. Bilateral renal parenchymal atrophy, greater on the right. Hyperdense lesion on the right kidney measuring 2.6 cm diameter is unchanged since prior study, likely hemorrhagic cyst. No specific imaging follow-up is indicated. Stomach/Bowel: Stomach and small bowel are mostly decompressed. Stool-filled colon without abnormal distention. No wall thickening or inflammatory changes. Appendix is normal. Vascular/Lymphatic: Aortic atherosclerosis. No enlarged abdominal or pelvic lymph nodes. Reproductive: Prostate is unremarkable. Other: No free air or free fluid in the abdomen. Abdominal wall musculature appears intact. Musculoskeletal: Degenerative changes in the spine. No destructive bone lesions. IMPRESSION: 1. Bilateral hydronephrosis and hydroureter, new since prior study. No obstructing mass or stones demonstrated suggesting reflux or stasis. Possible pyelonephritis. Bilateral renal parenchymal atrophy greater on the  right. 2. Bladder is decompressed with a Foley catheter but there appears to remain diffuse bladder wall thickening with significant stranding in the fat around the bladder, possibly cystitis. 3. Cholelithiasis. 4. Diffuse aortic atherosclerosis. 5. Patchy infiltrates in the lung bases with small right pleural effusion likely representing multifocal pneumonia. Electronically Signed   By: Burman Nieves M.D.   On: 04/30/2023 18:25   ECHOCARDIOGRAM COMPLETE Result Date: 04/30/2023    ECHOCARDIOGRAM REPORT   Patient Name:   EYOB GODLEWSKI Date of Exam: 04/30/2023 Medical Rec #:  [id number]      Height:       75.0 in Accession #:    [id number]     Weight:       171.5 lb Date of Birth:  05/22/1955      BSA:          2.056 m Patient Age:    67 years       BP:           114/66 mmHg Patient Gender: M              HR:           83 bpm. Exam Location:  Inpatient Procedure: 2D Echo, Color Doppler and Cardiac Doppler Indications:    Pulmonary HTN  History:        Patient has no prior history of Echocardiogram examinations.                 Risk Factors:Diabetes. ESRD.  Sonographer:    Webb Laws Referring Phys: [id number] OMAR M ALBUSTAMI IMPRESSIONS  1. Left ventricular ejection fraction, by estimation, is 65 to 70%. The left ventricle has normal function. The left ventricle has no regional wall motion abnormalities. There is moderate concentric left ventricular hypertrophy. Indeterminate diastolic filling due to E-A fusion.  2. Right ventricular systolic function is normal. The right ventricular size is normal. Tricuspid regurgitation signal is inadequate for assessing  PA pressure.  3. The mitral valve is grossly normal. Trivial mitral valve regurgitation. No evidence of mitral stenosis.  4. The aortic valve is tricuspid. Aortic valve regurgitation is not visualized. No aortic stenosis is present.  5. There is mild dilatation of the ascending aorta, measuring 40 mm.  6. The inferior vena cava is normal in size with  greater than 50% respiratory variability, suggesting right atrial pressure of 3 mmHg. FINDINGS  Left Ventricle: Left ventricular ejection fraction, by estimation, is 65 to 70%. The left ventricle has normal function. The left ventricle has no regional wall motion abnormalities. The left ventricular internal cavity size was normal in size. There is  moderate concentric left ventricular hypertrophy. Indeterminate diastolic filling due to E-A fusion. Right Ventricle: The right ventricular size is normal. No increase in right ventricular wall thickness. Right ventricular systolic function is normal. Tricuspid regurgitation signal is inadequate for assessing PA pressure. Left Atrium: Left atrial size was normal in size. Right Atrium: Right atrial size was normal in size. Pericardium: There is no evidence of pericardial effusion. Mitral Valve: The mitral valve is grossly normal. Trivial mitral valve regurgitation. No evidence of mitral valve stenosis. Tricuspid Valve: The tricuspid valve is grossly normal. Tricuspid valve regurgitation is trivial. No evidence of tricuspid stenosis. Aortic Valve: The aortic valve is tricuspid. Aortic valve regurgitation is not visualized. No aortic stenosis is present. Pulmonic Valve: The pulmonic valve was grossly normal. Pulmonic valve regurgitation is trivial. No evidence of pulmonic stenosis. Aorta: The aortic root is normal in size and structure. There is mild dilatation of the ascending aorta, measuring 40 mm. Venous: The inferior vena cava is normal in size with greater than 50% respiratory variability, suggesting right atrial pressure of 3 mmHg. IAS/Shunts: The atrial septum is grossly normal.  LEFT VENTRICLE PLAX 2D LVIDd:         5.30 cm   Diastology LVIDs:         2.50 cm   LV e' medial:    5.78 cm/s LV PW:         1.30 cm   LV E/e' medial:  8.3 LV IVS:        1.40 cm   LV e' lateral:   13.80 cm/s LVOT diam:     2.30 cm   LV E/e' lateral: 3.5 LV SV:         89 LV SV Index:   43  LVOT Area:     4.15 cm  RIGHT VENTRICLE             IVC RV Basal diam:  3.60 cm     IVC diam: 1.80 cm RV S prime:     22.40 cm/s TAPSE (M-mode): 1.8 cm LEFT ATRIUM             Index        RIGHT ATRIUM           Index LA diam:        4.30 cm 2.09 cm/m   RA Area:     13.40 cm LA Vol (A2C):   23.5 ml 11.43 ml/m  RA Volume:   30.70 ml  14.93 ml/m LA Vol (A4C):   34.6 ml 16.83 ml/m LA Biplane Vol: 29.6 ml 14.40 ml/m  AORTIC VALVE LVOT Vmax:   109.00 cm/s LVOT Vmean:  71.700 cm/s LVOT VTI:    0.214 m  AORTA Ao Root diam: 3.40 cm Ao Asc diam:  4.00 cm MITRAL VALVE MV Area (PHT):  3.21 cm    SHUNTS MV Decel Time: 236 msec    Systemic VTI:  0.21 m MV E velocity: 47.90 cm/s  Systemic Diam: 2.30 cm MV A velocity: 72.00 cm/s MV E/A ratio:  0.67 Lennie Odor MD Electronically signed by Lennie Odor MD Signature Date/Time: 04/30/2023/4:43:03 PM    Final    US RENAL Result Date: 04/30/2023 CLINICAL DATA:  Acute kidney injury EXAM: RENAL / URINARY TRACT ULTRASOUND COMPLETE COMPARISON:  PET CT 12/05/2022 ultrasound 04/08/2022 FINDINGS: Right Kidney: Renal measurements: 13 x 6.8 x 5.2 cm = volume: 485.4 mL. Cortex is echogenic. Diffuse cortical thinning consistent with atrophy. Severe hydronephrosis and hydroureter partially visualized. Cyst upper pole right kidney measuring 2.7 cm, no imaging follow-up is recommended. Left Kidney: Renal measurements: 12.4 x 5 x 5.1 cm = volume: 349.5 mL. Cortex is echogenic. Renal cortical thinning consistent with atrophy. Severe left-sided hydronephrosis and partially visualized hydroureter. Small cyst measuring 7 mm for which no imaging follow-up is recommended. Bladder: Avascular area within the dependent bladder measuring 5.6 by 4.8 x 1.3 cm, with straight edge suspected to represent fluid level Other: None. IMPRESSION: 1. Severe bilateral hydronephrosis and hydroureter which is new compared to prior 2. Echogenic kidneys with cortical thinning consistent with chronic kidney disease  and atrophy 3. Suspect layering echogenic avascular debris or hemorrhagic material within the dependent bladder. Electronically Signed   By: Jasmine Pang M.D.   On: 04/30/2023 15:09   DG CHEST PORT 1 VIEW Result Date: 04/30/2023 CLINICAL DATA:  68 year old male status post dialysis catheter placement. Metastatic head and neck cancer. EXAM: PORTABLE CHEST 1 VIEW COMPARISON:  Portable chest 06/01/2020.  PET-CT 07/25/2022. FINDINGS: Portable AP semi upright view at 0947 hours. Right IJ approach vascular catheter placed, tip is at the level of the carina, SVC. Mediastinal contours are stable and within normal limits. Lower lung volumes. No pneumothorax. Allowing for portable technique the lungs are clear. Visualized tracheal air column is within normal limits. Trace oral contrast in the gastric fundus. Paucity of bowel gas. No acute osseous abnormality identified. IMPRESSION: Right IJ approach vascular catheter placed, tip at the level of the SVC. No pneumothorax or acute cardiopulmonary abnormality. Electronically Signed   By: Odessa Fleming M.D.   On: 04/30/2023 10:03    PMH:   Past Medical History:  Diagnosis Date   Arthritis    Cellulitis 03/14/2019   feet   CKD (chronic kidney disease)    Diabetes mellitus without complication (HCC)    Type II   End-stage kidney disease (HCC) 03/14/2019   was on hemodialysis ~ June 2021 - March 2022 when he discontinued; followed by Dr. Zetta Bills   GERD (gastroesophageal reflux disease)    Heart murmur    History of blood transfusion    Hypertension    12/30/19- having low blood pressure since beginning   Irregular heart beat    UTI (urinary tract infection)     PSH:   Past Surgical History:  Procedure Laterality Date   arm surgery Right    was cut to the bone   BASCILIC VEIN TRANSPOSITION Left 11/18/2019   Procedure: FIRST STAGE LEFT ARM BASCILIC VEIN TRANSPOSITION;  Surgeon: Nada Libman, MD;  Location: [facility] OR;  Service: Vascular;  Laterality: Left;    BASCILIC VEIN TRANSPOSITION Left 01/04/2020   Procedure: LEFT SECOND STAGE BASCILIC VEIN TRANSPOSITION;  Surgeon: Nada Libman, MD;  Location: [facility] OR;  Service: Vascular;  Laterality: Left;   COLONOSCOPY  06/2019  DIRECT LARYNGOSCOPY Bilateral 08/06/2022   Procedure: DIRECT LARYNGOSCOPY WITH BIOPSY;  Surgeon: Christia Reading, MD;  [facility] OR;  Service: ENT;  Laterality: Bilateral;   EYE SURGERY Bilateral    cataract   NASOPHARYNGEAL BIOPSY Left 08/06/2022   Procedure: NASOPHARYNGEAL BIOPSY;  Surgeon: Christia Reading, MD;  Location: [facility] OR;  Service: ENT;  Laterality: Left;   TONSILLECTOMY Left 08/06/2022   Procedure: TONSILLECTOMY;  Surgeon: Christia Reading, MD;  Location: [facility] OR;  Service: ENT;  Laterality: Left;   TRANSURETHRAL RESECTION OF PROSTATE N/A 08/08/2019   Procedure: TRANSURETHRAL RESECTION OF THE PROSTATE (TURP);  Surgeon: Crista Elliot, MD;  Location: [facility] ORS;  Service: Urology;  Laterality: N/A;   UPPER GASTROINTESTINAL ENDOSCOPY  06/2019   WISDOM TOOTH EXTRACTION      Allergies:  Allergies  Allergen Reactions   Hydrocodone Hives and Itching   Percocet [Oxycodone-Acetaminophen] Hives and Itching    Medications:   Prior to Admission medications   Medication Sig Start Date End Date Taking? Authorizing Provider  acetaminophen (TYLENOL) 500 MG tablet Take 1,000 mg by mouth every 6 (six) hours as needed for headache (pain).    Yes [provider]  amLODipine (NORVASC) 5 MG tablet Take 1 tablet (5 mg total) by mouth at bedtime. 10/22/22  Yes Latrelle Dodrill, MD  carvedilol (COREG) 25 MG tablet Take 25 mg by mouth 2 (two) times daily with a meal.   Yes [provider]  Cholecalciferol (VITAMIN D3) 125 MCG (5000 UT) CAPS Take 5,000 Units by mouth daily.   Yes [provider]  esomeprazole (NEXIUM) 40 MG capsule TAKE 1 CAPSULE BY MOUTH ONCE DAILY AT  NOON Patient taking differently: Take 40 mg by mouth daily as needed (for heartburn). 08/23/21  Yes  Mansouraty, Netty Starring., MD  gabapentin (NEURONTIN) 300 MG capsule Take 1 capsule (300 mg total) by mouth at bedtime. 01/12/23  Yes Latrelle Dodrill, MD  LANTUS SOLOSTAR 100 UNIT/ML Solostar Pen Inject 6 units subcutaneously once daily 01/21/23  Yes Latrelle Dodrill, MD  loperamide (IMODIUM A-D) 2 MG tablet Take 1 tablet (2 mg total) by mouth 4 (four) times daily as needed for diarrhea or loose stools. 06/12/20  Yes Latrelle Dodrill, MD  mupirocin ointment (BACTROBAN) 2 % Apply 1 Application topically 2 (two) times daily. Left ear canal   Yes [provider]  sodium bicarbonate 650 MG tablet Take 650 mg by mouth 2 (two) times daily.   Yes [provider]  tamsulosin (FLOMAX) 0.4 MG CAPS capsule Take 0.4 mg by mouth daily.   Yes [provider]  traMADol (ULTRAM) 50 MG tablet Take 1 tablet (50 mg total) by mouth every 6 (six) hours as needed. Patient taking differently: Take 50 mg by mouth every 6 (six) hours as needed for moderate pain (pain score 4-6). 02/26/22  Yes Latrelle Dodrill, MD  trimethoprim (TRIMPEX) 100 MG tablet Take 100 mg by mouth daily. 01/08/22  Yes [provider]  VELTASSA 8.4 g packet Take 1 packet by mouth daily.   Yes [provider]  blood glucose meter kit and supplies KIT Dispense based on patient and insurance preference. Use up to four times daily as directed. (FOR ICD-9 250.00, 250.01). 03/28/19   Elease Etienne, MD  Insulin Syringes, Disposable, U-100 0.5 ML MISC Use as per instructions 3 times daily AC. 03/28/19   Hongalgi, Maximino Greenland, MD    Discontinued Meds:   Medications Discontinued During This Encounter  Medication  Reason   sodium chloride 0.9 % bolus 500 mL    hydrocortisone 2.5 % cream Patient Preference   famotidine (PEPCID) 20 MG tablet Patient Preference   sodium zirconium cyclosilicate (LOKELMA) packet 10 g    0.9 %  sodium chloride infusion    insulin aspart (novoLOG) injection 0-6 Units     sodium zirconium cyclosilicate (LOKELMA) packet 10 g    patiromer Lelon Perla) packet 1 packet     Social History:  reports that he quit smoking about 6 months ago. His smoking use included cigarettes. He started smoking about 54 years ago. He has a 25.6 pack-year smoking history. He has never used smokeless tobacco. He reports that he does not currently use alcohol. He reports that he does not use drugs.  Family History:   Family History  Problem Relation Age of Onset   Diabetes type II Mother    Lung cancer Mother    Hypertension Mother    Diabetes type II Father    Diabetes type II Sister    Diabetes type II Brother    Hypertension Brother    Other Son        burned   CAD Neg Hx    Colon cancer Neg Hx    Esophageal cancer Neg Hx    Inflammatory bowel disease Neg Hx    Liver disease Neg Hx    Pancreatic cancer Neg Hx    Rectal cancer Neg Hx    Stomach cancer Neg Hx     Blood pressure (!) 103/48, pulse 76, temperature 97.6 F (36.4 C), temperature source Oral, resp. rate 17, height 6\' 3"  (1.905 m), weight 76.6 kg, SpO2 94%. 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

## 2023-05-01 NOTE — Progress Notes (Signed)
Subjective: First time meeting Louis Butler.  He was awake up in bed and undergoing dialysis on my arrival.  He was accompanied by his wife.  He is putting out a small amount of dark sediment filled urine.  He was in a poor mood overall and does not wish to participate in care.  Objective: Vital signs in last 24 hours: Temp:  [97.5 F (36.4 C)-97.9 F (36.6 C)] 97.9 F (36.6 C) (01/24 0753) Pulse Rate:  [42-96] 71 (01/24 0800) Resp:  [10-30] 13 (01/24 0800) BP: (62-180)/(43-155) 114/54 (01/24 0800) SpO2:  [75 %-99 %] 97 % (01/24 0800) Weight:  [76.6 kg] 76.6 kg (01/24 0438)  Assessment/Plan: # Bilateral hydronephrosis #BPH # Acute urinary retention # AoCKD V  Foley catheter placed on 1/23 by Dr. Gordan Butler  Trend labs.  Serum creatinine > 30. Now down to 7.3 on CRRT S/p TURP with Dr. Alvester Butler in 2021 Foley catheter will likely need to stay in place until patient has a chance to follow-up with Dr. Alvester Butler on an outpatient basis to assess for any potential surgical needs.  That being said, after a kidney injury of this degree with stage V kidney disease, I am not sure how much of a renal recovery he is going to make.  In my conversation with the patient and wife it was made clear by both parties that he does not have interest in participating in his treatment going forward and that he does not want to participate in dialysis.  He states that he does not care if he dies and "everyone has to go sometime".  They both confirm that he refused to go seek medical care or EMS for multiple days until he fell so weak that he was no longer able to resist, and that is how he ended up in hospital.  We had a long discussion about goals of care and involving palliative care if he truly was not interested in participation. They have agreed to speak with them.   Intake/Output from previous day: 01/23 0701 - 01/24 0700 In: 662.6 [P.O.:130; I.V.:271.7; IV Piggyback:260.8] Out: 1240  [Urine:820]  Intake/Output this shift: Total I/O In: 26.3 [I.V.:26.3] Out: -   Physical Exam:  General: Alert and oriented CV: No cyanosis Lungs: equal chest rise Abdomen: Soft, NTND, no rebound or guarding Gu: Foley catheter in place draining small volumes of concentrated sediment filled urine  Lab Results: Recent Labs    04/29/23 1744 04/30/23 0410 05/01/23 0443  HGB 9.5* 8.2* 8.3*  HCT 28.6* 23.7* 24.6*   BMET Recent Labs    04/30/23 2224 05/01/23 0443  NA 132* 134*  K 4.7 4.6  CL 99 101  CO2 18* 21*  GLUCOSE 164* 144*  BUN 83* 58*  CREATININE 10.02* 7.13*  CALCIUM 7.4* 7.5*     Studies/Results: CT ABDOMEN PELVIS WO CONTRAST Result Date: 04/30/2023 CLINICAL DATA:  Hydronephrosis EXAM: CT ABDOMEN AND PELVIS WITHOUT CONTRAST TECHNIQUE: Multidetector CT imaging of the abdomen and pelvis was performed following the standard protocol without IV contrast. RADIATION DOSE REDUCTION: This exam was performed according to the departmental dose-optimization program which includes automated exposure control, adjustment of the mA and/or kV according to patient size and/or use of iterative reconstruction technique. COMPARISON:  Renal ultrasound 04/30/2023. PET-CT 12/05/2022. CT abdomen and pelvis 11/13/2021 FINDINGS: Lower chest: Patchy infiltrates in both lung bases likely representing multifocal pneumonia. Trace right pleural effusion. Hepatobiliary: Cholelithiasis. No gallbladder wall thickening or stranding. No bile duct dilatation. No focal liver  lesions. Pancreas: Unremarkable. No pancreatic ductal dilatation or surrounding inflammatory changes. Spleen: Normal in size without focal abnormality. Adrenals/Urinary Tract: No adrenal gland nodules. Prominent bilateral hydronephrosis and hydroureter increasing since the previous study. The bladder is decompressed with a Foley catheter but there appears to be residual bladder wall thickening and there is stranding in the fat around the  bladder which is similar to the prior study. No discrete ureteral stones are demonstrated suggesting that hydronephrosis and ureterectasis probably results from reflux or stasis. Cystitis and pyelonephritis could be present. Correlate with urinalysis. Bilateral renal parenchymal atrophy, greater on the right. Hyperdense lesion on the right kidney measuring 2.6 cm diameter is unchanged since prior study, likely hemorrhagic cyst. No specific imaging follow-up is indicated. Stomach/Bowel: Stomach and small bowel are mostly decompressed. Stool-filled colon without abnormal distention. No wall thickening or inflammatory changes. Appendix is normal. Vascular/Lymphatic: Aortic atherosclerosis. No enlarged abdominal or pelvic lymph nodes. Reproductive: Prostate is unremarkable. Other: No free air or free fluid in the abdomen. Abdominal wall musculature appears intact. Musculoskeletal: Degenerative changes in the spine. No destructive bone lesions. IMPRESSION: 1. Bilateral hydronephrosis and hydroureter, new since prior study. No obstructing mass or stones demonstrated suggesting reflux or stasis. Possible pyelonephritis. Bilateral renal parenchymal atrophy greater on the right. 2. Bladder is decompressed with a Foley catheter but there appears to remain diffuse bladder wall thickening with significant stranding in the fat around the bladder, possibly cystitis. 3. Cholelithiasis. 4. Diffuse aortic atherosclerosis. 5. Patchy infiltrates in the lung bases with small right pleural effusion likely representing multifocal pneumonia. Electronically Signed   By: Louis Butler M.D.   On: 04/30/2023 18:25   ECHOCARDIOGRAM COMPLETE Result Date: 04/30/2023    ECHOCARDIOGRAM REPORT   Patient Name:   Louis Butler Date of Exam: 04/30/2023 Medical Rec #:  [id number]      Height:       75.0 in Accession #:    [id number]     Weight:       171.5 lb Date of Birth:  [birth year]      BSA:          2.056 m Patient Age:    67 years       BP:            114/66 mmHg Patient Gender: M              HR:           83 bpm. Exam Location:  Inpatient Procedure: 2D Echo, Color Doppler and Cardiac Doppler Indications:    Pulmonary HTN  History:        Patient has no prior history of Echocardiogram examinations.                 Risk Factors:Diabetes. ESRD.  Sonographer:    Webb Laws Referring Phys: [id number] OMAR M ALBUSTAMI IMPRESSIONS  1. Left ventricular ejection fraction, by estimation, is 65 to 70%. The left ventricle has normal function. The left ventricle has no regional wall motion abnormalities. There is moderate concentric left ventricular hypertrophy. Indeterminate diastolic filling due to E-A fusion.  2. Right ventricular systolic function is normal. The right ventricular size is normal. Tricuspid regurgitation signal is inadequate for assessing PA pressure.  3. The mitral valve is grossly normal. Trivial mitral valve regurgitation. No evidence of mitral stenosis.  4. The aortic valve is tricuspid. Aortic valve regurgitation is not visualized. No aortic stenosis is present.  5. There is mild dilatation of the ascending aorta,  measuring 40 mm.  6. The inferior vena cava is normal in size with greater than 50% respiratory variability, suggesting right atrial pressure of 3 mmHg. FINDINGS  Left Ventricle: Left ventricular ejection fraction, by estimation, is 65 to 70%. The left ventricle has normal function. The left ventricle has no regional wall motion abnormalities. The left ventricular internal cavity size was normal in size. There is  moderate concentric left ventricular hypertrophy. Indeterminate diastolic filling due to E-A fusion. Right Ventricle: The right ventricular size is normal. No increase in right ventricular wall thickness. Right ventricular systolic function is normal. Tricuspid regurgitation signal is inadequate for assessing PA pressure. Left [facility]: Left atrial size was normal in size. Right [facility]: Right atrial size was normal in  size. Pericardium: There is no evidence of pericardial effusion. Mitral Valve: The mitral valve is grossly normal. Trivial mitral valve regurgitation. No evidence of mitral valve stenosis. Tricuspid Valve: The tricuspid valve is grossly normal. Tricuspid valve regurgitation is trivial. No evidence of tricuspid stenosis. Aortic Valve: The aortic valve is tricuspid. Aortic valve regurgitation is not visualized. No aortic stenosis is present. Pulmonic Valve: The pulmonic valve was grossly normal. Pulmonic valve regurgitation is trivial. No evidence of pulmonic stenosis. Aorta: The aortic root is normal in size and structure. There is mild dilatation of the ascending aorta, measuring 40 mm. Venous: The inferior vena cava is normal in size with greater than 50% respiratory variability, suggesting right atrial pressure of 3 mmHg. IAS/Shunts: The atrial septum is grossly normal.  LEFT VENTRICLE PLAX 2D LVIDd:         5.30 cm   Diastology LVIDs:         2.50 cm   LV e' medial:    5.78 cm/s LV PW:         1.30 cm   LV E/e' medial:  8.3 LV IVS:        1.40 cm   LV e' lateral:   13.80 cm/s LVOT diam:     2.30 cm   LV E/e' lateral: 3.5 LV SV:         89 LV SV Index:   43 LVOT Area:     4.15 cm  RIGHT VENTRICLE             IVC RV Basal diam:  3.60 cm     IVC diam: 1.80 cm RV S prime:     22.40 cm/s TAPSE (M-mode): 1.8 cm LEFT ATRIUM             Index        RIGHT ATRIUM           Index LA diam:        4.30 cm 2.09 cm/m   RA Area:     13.40 cm LA Vol (A2C):   23.5 ml 11.43 ml/m  RA Volume:   30.70 ml  14.93 ml/m LA Vol (A4C):   34.6 ml 16.83 ml/m LA Biplane Vol: 29.6 ml 14.40 ml/m  AORTIC VALVE LVOT Vmax:   109.00 cm/s LVOT Vmean:  71.700 cm/s LVOT VTI:    0.214 m  AORTA Ao Root diam: 3.40 cm Ao Asc diam:  4.00 cm MITRAL VALVE MV Area (PHT): 3.21 cm    SHUNTS MV Decel Time: 236 msec    Systemic VTI:  0.21 m MV E velocity: 47.90 cm/s  Systemic Diam: 2.30 cm MV A velocity: 72.00 cm/s MV E/A ratio:  0.67 Lennie Odor MD  Electronically signed by Lennie Odor  MD Signature Date/Time: 04/30/2023/4:43:03 PM    Final    US RENAL Result Date: 04/30/2023 CLINICAL DATA:  Acute kidney injury EXAM: RENAL / URINARY TRACT ULTRASOUND COMPLETE COMPARISON:  PET CT 12/05/2022 ultrasound 04/08/2022 FINDINGS: Right Kidney: Renal measurements: 13 x 6.8 x 5.2 cm = volume: 485.4 mL. Cortex is echogenic. Diffuse cortical thinning consistent with atrophy. Severe hydronephrosis and hydroureter partially visualized. Cyst upper pole right kidney measuring 2.7 cm, no imaging follow-up is recommended. Left Kidney: Renal measurements: 12.4 x 5 x 5.1 cm = volume: 349.5 mL. Cortex is echogenic. Renal cortical thinning consistent with atrophy. Severe left-sided hydronephrosis and partially visualized hydroureter. Small cyst measuring 7 mm for which no imaging follow-up is recommended. Bladder: Avascular area within the dependent bladder measuring 5.6 by 4.8 x 1.3 cm, with straight edge suspected to represent fluid level Other: None. IMPRESSION: 1. Severe bilateral hydronephrosis and hydroureter which is new compared to prior 2. Echogenic kidneys with cortical thinning consistent with chronic kidney disease and atrophy 3. Suspect layering echogenic avascular debris or hemorrhagic material within the dependent bladder. Electronically Signed   By: Jasmine Pang M.D.   On: 04/30/2023 15:09   DG CHEST PORT 1 VIEW Result Date: 04/30/2023 CLINICAL DATA:  68 year old male status post dialysis catheter placement. Metastatic head and neck cancer. EXAM: PORTABLE CHEST 1 VIEW COMPARISON:  Portable chest 06/01/2020.  PET-CT 07/25/2022. FINDINGS: Portable AP semi upright view at 0947 hours. Right IJ approach vascular catheter placed, tip is at the level of the carina, SVC. Mediastinal contours are stable and within normal limits. Lower lung volumes. No pneumothorax. Allowing for portable technique the lungs are clear. Visualized tracheal air column is within normal  limits. Trace oral contrast in the gastric fundus. Paucity of bowel gas. No acute osseous abnormality identified. IMPRESSION: Right IJ approach vascular catheter placed, tip at the level of the SVC. No pneumothorax or acute cardiopulmonary abnormality. Electronically Signed   By: Odessa Fleming M.D.   On: 04/30/2023 10:03      LOS: 2 days   Elmon Kirschner, NP [facility] Pager: [phone]  05/01/2023, 8:51 AM

## 2023-05-01 NOTE — Progress Notes (Signed)
NAME:  Louis Butler, MRN:  [id number], DOB:  [birth year], LOS: 2 ADMISSION DATE:  04/29/2023, CONSULTATION DATE:  04/29/2023 REFERRING MD:  Jearld Fenton, EDP, CHIEF COMPLAINT:  ESRD; hyperkalemia; ams  History of Present Illness:  68 year old male with past medical history of ESRD (discontinued HD 06/2020, followed by Dr. 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   Patient is encephalopathic and history obtained from chart review and discussion with wife and multiple other family members (at bedside). Reported 1 week of nausea, vomiting, decreased PO intake. Additionally, symptoms of uremic Plt dysfunction noted with confusion/AMS, epistaxis and hematuria. Labs notable for Na 126, K >7.5, BUN/sCr 260/30.39, AG 21, Hgb 9.5. EKG with peaked T waves and was temporized with calcium gluconate, bicarb, insulin/dextrose, Lasix. Nephrology was consulted and recommended RRT, likely CRRT.  PCCM consulted for admission.   Pertinent  Medical History  ESRD (discontinued HD 06/2020, followed by Dr. Allena Katz), GERD, T2DM, HTN  Significant Hospital Events: Including procedures, antibiotic start and stop dates in addition to other pertinent events   1/22: Admit to ICU for renal failure, emergent HD 1/23 remains hyperkalemic; temp hd placement, transitioned to CRRT, urology consult, foley placed/ irrigated for thick debris  Interim History / Subjective:  Remains on CRRT, UF even Started on NE overnight Refusing bowel regimen because doesn't want to use bedpan.  C/o of mild lower abd pain Denies nausea or SOB  Objective   Blood pressure (!) 114/54, pulse 71, temperature 97.9 F (36.6 C), temperature source Oral, resp. rate 13, height 6\' 3"  (1.905 m), weight 76.6 kg, SpO2 97%.        Intake/Output Summary (Last 24 hours) at 05/01/2023 7829 Last data filed at 05/01/2023 0800 Gross per 24 hour  Intake 654.19 ml  Output 1240 ml  Net -585.81 ml    Filed Weights   04/29/23 2134 04/30/23 0709 05/01/23 0438  Weight: 76.3 kg 77.8 kg 76.6 kg   Physical Examination: General:  chronically ill appearing elderly male in NAD drinking coffee Neuro: Alert, appropriate, MAE- no appreciated focal deficits limited 2/2 to pt irritable, doesn't always answer questions,and argumentative at times  CV: rr, NSR, LUE AVF +b/t PULM:  non labored, clear anteriorly, diminished in bases/ shallow at times, room air GI: soft, bs hyper, ND, foley - amber with copious sediment Extremities: warm/dry, no pitting tibial edema, numerous scabs to LEs  Afebrile Labs> K 4.6, Na 134, BUN/ sCr 83/ 10> 58/ 7.13, H/H stable, plts 140 UOP 233ml/ 12hrs -527ml/ 24hrs Net +562 Micro reviewed  Last BM pta/ 2 days ago  Resolved Hospital Problem List:     Assessment & Plan:  Acute-on-chronic renal failure; baseline sCr ~3.8 (10/2022).  Self discontinued dialysis 06/2020. Old LUE fistula, followed by Dr. Allena Katz ([facility]). BUN 260, Cr 30.39. K > 7.5, temporized with hyperkalemia protocol. Encephalopathic.  Progression to ESRD Hyperkalemia Hyponatremia Hypocalcemia Uremia Hydronephrosis 2/2 obstruction due to debris Plan: - CRRT per Nephrology - oral bicarb per nephrology  - cont to wean NE for MAP goal > 60-65 - add midodrine - Urology following s/p foley placement, irrigated due to thick debris till clear, output of urine/ debris.  Continue to monitor closely, further recs per urology - pt agrees to cont dialysis for now, would like to change outpt HD center, defer to Nephrology  Shock- multifactorial - after starting CRRT/ fentanyl overnight - keeping even balance - check CVP.  Wts unchanged  since admit - cont NE for MAP goal > 65 - add midodrine - cont zosyn, day 2 given hx of pseudomonal UTI - trend fever curve/ CBC - follow cultures   Acute metabolic encephalopathy in the setting of uremia Plan: - resolved  - cont to limit sedating meds - ammonia  17  T2DM  Plan: - cont SSI prn   Hypertension  Home medications: amlodipine, carvedilol. 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 Plan: - Outpatient follow up with [facility]  Stage 2 sacral ulcer POA At risk protein calorie malnutrition Plan: - WOC, frequent turns, maximize nutrition - PT  GOC - pt wishes to remain full code at this time, stated "dont ask me again".     Best Practice (right click and "Reselect all SmartList Selections" daily)   Diet/type: Regular consistency (see orders)> renal DVT prophylaxis: prophylactic heparin  Pressure ulcer(s): stage 2 sacral POA GI prophylaxis: PPI Lines: Dialysis Catheter- temp HD R internal jugular 1/23 Foley:  Yes, and it is still needed Code Status:  full code Last date of multidisciplinary goals of care discussion [1/24]  Wife at bedside.   Labs   CBC: Recent Labs  Lab 04/29/23 1744 04/30/23 0410 05/01/23 0443  WBC 8.2 6.5 8.3  HGB 9.5* 8.2* 8.3*  HCT 28.6* 23.7* 24.6*  MCV 90.2 87.5 88.2  PLT 162 133* 140*   Basic Metabolic Panel: Recent Labs  Lab 04/30/23 0000 04/30/23 0410 04/30/23 0411 04/30/23 0412 04/30/23 0858 04/30/23 1622 04/30/23 2224 05/01/23 0443  NA 129*  --   --  124*  --  133* 132* 134*  K 6.7*  --    < > 5.4* 3.9 4.5 4.7 4.6  CL 96*  --   --  92*  --  97* 99 101  CO2 12*  --   --  13*  --  20* 18* 21*  GLUCOSE 123*  --   --  144*  --  122* 164* 144*  BUN 248*  --   --  258*  --  109* 83* 58*  CREATININE 26.60*  --   --  26.68*  --  12.45* 10.02* 7.13*  CALCIUM 6.8*  --   --  6.4*  --  7.7* 7.4* 7.5*  MG  --  2.5*  --   --   --   --    --  2.2  PHOS  --   --   --  >30.0*  --  5.5* 4.4 3.5   < > = values in this interval not displayed.   GFR: Estimated Creatinine Clearance: 10.9 mL/min (A) (by C-G formula based on SCr of 7.13 mg/dL (H)). Recent Labs  Lab 04/29/23 1744 04/30/23 0410 05/01/23 0443  WBC 8.2 6.5 8.3   Liver Function Tests: Recent Labs  Lab 04/30/23 0412 04/30/23 1622 04/30/23 2052 04/30/23 2224 05/01/23 0443  AST  --   --  15  --   --   ALT  --   --  11  --   --   ALKPHOS  --   --  47  --   --   BILITOT  --   --  1.2  --   --   PROT  --   --  7.0  --   --   ALBUMIN 2.8* 2.6* 2.4* 2.5* 2.5*   No results for input(s): "LIPASE", "AMYLASE" in the last 168 hours. Recent Labs  Lab 04/30/23 0955  AMMONIA 17    ABG:    Component Value Date/Time   TCO2 28 01/04/2020 0805    Coagulation Profile: Recent Labs  Lab 04/29/23 2134  INR 1.3*    Cardiac Enzymes: No results for input(s): "CKTOTAL", "CKMB", "CKMBINDEX", "TROPONINI" in the last 168 hours.  HbA1C: Hemoglobin A1C  Date/Time Value Ref Range Status  06/29/2020 12:00 AM 7.0  Final   HbA1c, POC (prediabetic range)  Date/Time Value Ref Range Status  10/22/2022 01:57 PM 6.3 5.7 - 6.4 % Final   HbA1c, POC (controlled diabetic range)  Date/Time Value Ref Range Status  05/01/2022 10:18 AM 6.6 0.0 - 7.0 % Final  01/30/2022 09:39 AM 6.3 0.0 - 7.0 % Final   Hgb A1c MFr Bld  Date/Time Value Ref Range Status  04/30/2023 04:10 AM 6.8 (H) 4.8 - 5.6 % Final    Comment:    (NOTE) Pre diabetes:          5.7%-6.4%  Diabetes:              >6.4%  Glycemic control for   <7.0% adults with diabetes    CBG: Recent Labs  Lab 04/30/23 1551 04/30/23 2006 05/01/23 0001 05/01/23 0417 05/01/23 0751  GLUCAP 105* 122* 145* 185* 124*   Allergies: Allergies  Allergen Reactions   Hydrocodone Hives and Itching   Percocet [Oxycodone-Acetaminophen] Hives and Itching    Home Medications: Prior to Admission medications   Medication Sig  Start Date End Date Taking? Authorizing Provider  acetaminophen (TYLENOL) 500 MG tablet Take 1,000 mg by mouth every 6 (six) hours as needed for headache (pain).     [provider]  amLODipine (NORVASC) 5 MG tablet Take 1 tablet (5 mg total) by mouth at bedtime. 10/22/22   Latrelle Dodrill, MD  blood glucose meter kit and supplies KIT Dispense based on patient and insurance preference. Use up to four times daily as directed. (FOR ICD-9 250.00, 250.01). 03/28/19   Hongalgi, Maximino Greenland, MD  carvedilol (COREG) 25 MG tablet Take 25 mg by mouth 2 (two) times daily with a meal.    [provider]  Cholecalciferol (VITAMIN D3) 125 MCG (5000 UT) CAPS Take 5,000 Units by mouth 2 (two) times a week.    [provider]  esomeprazole (NEXIUM) 40 MG capsule TAKE 1 CAPSULE BY MOUTH ONCE DAILY AT  NOON Patient taking differently: Take 40 mg by mouth daily as needed. 08/23/21   Mansouraty, Netty Starring., MD  famotidine (PEPCID) 20 MG tablet Take one tablet by mouth daily 01/02/23   Latrelle Dodrill, MD  gabapentin (NEURONTIN) 300 MG capsule Take 1 capsule (300 mg total) by mouth at bedtime. 01/12/23   Latrelle Dodrill, MD  hydrocortisone 2.5 % cream Apply to affected area twice daily as needed 01/21/23   Latrelle Dodrill, MD  Insulin Syringes, Disposable, U-100 0.5 ML MISC Use as per instructions 3 times daily AC. 03/28/19   Hongalgi, Maximino Greenland, MD  LANTUS SOLOSTAR 100 UNIT/ML Solostar Pen Inject 6 units subcutaneously once daily 01/21/23   Latrelle Dodrill, MD  loperamide (IMODIUM A-D) 2 MG tablet Take 1 tablet (2 mg total) by mouth 4 (four) times daily as needed for  diarrhea or loose stools. 06/12/20   Latrelle Dodrill, MD  sodium bicarbonate 650 MG tablet Take 650 mg by mouth 2 (two) times daily.    [provider]  tamsulosin (FLOMAX) 0.4 MG CAPS capsule Take 0.4 mg by mouth daily.    [provider]  traMADol (ULTRAM) 50 MG tablet Take 1 tablet (50 mg  total) by mouth every 6 (six) hours as needed. 02/26/22   Latrelle Dodrill, MD  trimethoprim (TRIMPEX) 100 MG tablet Take 100 mg by mouth daily. 01/08/22   [provider]  VELTASSA 8.4 g packet Take 1 packet by mouth daily.    [provider]   Critical care time: 38 mins minutes       Posey Boyer, MSN, AG-ACNP-BC [facility] 05/01/2023, 10:00 AM  See Amion for pager If no response to pager , please call [phone] until 7pm After 7:00 pm call Elink  [phone]

## 2023-05-01 NOTE — Progress Notes (Signed)
Initial Nutrition Assessment  DOCUMENTATION CODES:   Non-severe (moderate) malnutrition in context of chronic illness  INTERVENTION:   Boost Breeze po BID, each supplement provides 250 kcal and 9 grams of protein (mix with ginger ale per patient preference).  Liberalize diet to regular, continue 1200 ml fluid restriction.  Encourage POs (family to bring in food). Renal MVI daily.  NUTRITION DIAGNOSIS:   Moderate Malnutrition related to chronic illness (renal disease) as evidenced by moderate fat depletion, mild muscle depletion, moderate muscle depletion, percent weight loss (16% weight loss within 6 months).  GOAL:   Patient will meet greater than or equal to 90% of their needs  MONITOR:   PO intake, Supplement acceptance, Skin  REASON FOR ASSESSMENT:   Consult Assessment of nutrition requirement/status (CRRT)  ASSESSMENT:   68 yo male admitted with acute on chronic renal failure. PMH includes ESRD-stopped HD 06/2020, DM-2, GERD, HTN.  Renal function has progressed to ESRD. Patient has altered taste, suspect related to uremia. CRRT initiated yesterday, discontinued today with plans to trial iHD.    Spoke with patient and family in room (granddaughter and her husband). Patient reports unsure of weight loss, but knows he has lost muscle mass. He eats poorly at home and that has been going on for a while. He tried some [employer], but didn't like it. RD mixed it with some ginger ale and he accepted it better. He has been wanting to drink something else besides water. Family wants to bring in food. Patient likes BBQ and asked for a Big Mac.   Labs reviewed. Na 134 (L), K 4.6 WNL, Phos 3.5 WNL CBG: [phone]  Medications reviewed and include vitamin D3, Novolog, Protonix, sodium bicarb tablet, Flomax, Levophed.  Weight history reviewed. Patient with 16% weight loss within the past 6 months.  Patient meets criteria for moderate malnutrition, given mild-moderate  depletion of muscle and subcutaneous fat mass with 16% weight loss in 6 months.  NUTRITION - FOCUSED PHYSICAL EXAM:  Flowsheet Row Most Recent Value  Orbital Region Moderate depletion  Upper Arm Region Moderate depletion  Thoracic and Lumbar Region Moderate depletion  Buccal Region Moderate depletion  Temple Region Moderate depletion  Clavicle Bone Region Moderate depletion  Clavicle and Acromion Bone Region Moderate depletion  Scapular Bone Region Moderate depletion  Dorsal Hand Mild depletion  Patellar Region Mild depletion  Anterior Thigh Region Mild depletion  Posterior Calf Region Mild depletion  Edema (RD Assessment) None  Hair Reviewed  Eyes Reviewed  Mouth Reviewed  Skin Reviewed  Nails Reviewed       Diet Order:   Diet Order             Diet renal/carb modified with fluid restriction Diet-HS Snack? Nothing; Fluid restriction: 1200 mL Fluid; Room service appropriate? Yes; Fluid consistency: Thin  Diet effective now                   EDUCATION NEEDS:   Education needs have been addressed  Skin:  Skin Assessment: Skin Integrity Issues: Skin Integrity Issues:: Stage II Stage II: R buttocks  Last BM:  no BM documented  Height:   Ht Readings from Last 1 Encounters:  04/29/23 6\' 3"  (1.905 m)    Weight:   Wt Readings from Last 1 Encounters:  05/01/23 76.6 kg    Ideal Body Weight:  89.1 kg  BMI:  Body mass index is 21.11 kg/m.  Estimated Nutritional Needs:   Kcal:  2200-2400  Protein:  110-120 gm  Fluid:  1 L + UOP   Gabriel Rainwater RD, LDN, CNSC Contact Inpatient RD using Secure Chat. If unavailable, use group chat "RD Inpatient" via Secure Chat in EP[employer].

## 2023-05-01 NOTE — Progress Notes (Signed)
Pt with agitation throughout shift, insisting to get out of bed to bedside commode. Around 2330 this RN, along with Avel Peace RN, assessed patient's readiness to stand by sitting him on edge of bed. Pt immediately became dizzy and was promptly placed back in supine and offered a bedpan. Around  0400, pt woke up and attempted to get out of bed. Pt positioned back in bed and educated on why it is unsafe for him to get out of bed at this time. Pt agitated and argumentative with safety measures. [name] wife at bedside stating that pt is "stubborn" and "does not listen". Pt has bed alarm, fall mats, fall risk bracelet, and non-slip socks.  Patient's behavior noted in chart as requested by pt's wife, Louis Butler.

## 2023-05-02 ENCOUNTER — Inpatient Hospital Stay (HOSPITAL_COMMUNITY): Payer: Medicare Other

## 2023-05-02 DIAGNOSIS — R6521 Severe sepsis with septic shock: Secondary | ICD-10-CM | POA: Diagnosis not present

## 2023-05-02 DIAGNOSIS — N179 Acute kidney failure, unspecified: Secondary | ICD-10-CM | POA: Diagnosis not present

## 2023-05-02 DIAGNOSIS — I9581 Postprocedural hypotension: Secondary | ICD-10-CM | POA: Diagnosis not present

## 2023-05-02 DIAGNOSIS — A419 Sepsis, unspecified organism: Secondary | ICD-10-CM | POA: Diagnosis not present

## 2023-05-02 LAB — CBC
HCT: 23.4 % — ABNORMAL LOW (ref 39.0–52.0)
Hemoglobin: 7.7 g/dL — ABNORMAL LOW (ref 13.0–17.0)
MCH: 30.3 pg (ref 26.0–34.0)
MCHC: 32.9 g/dL (ref 30.0–36.0)
MCV: 92.1 fL (ref 80.0–100.0)
Platelets: 127 10*3/uL — ABNORMAL LOW (ref 150–400)
RBC: 2.54 MIL/uL — ABNORMAL LOW (ref 4.22–5.81)
RDW: 13.2 % (ref 11.5–15.5)
WBC: 7.3 10*3/uL (ref 4.0–10.5)
nRBC: 0 % (ref 0.0–0.2)

## 2023-05-02 LAB — RENAL FUNCTION PANEL
Albumin: 2.3 g/dL — ABNORMAL LOW (ref 3.5–5.0)
Anion gap: 11 (ref 5–15)
BUN: 50 mg/dL — ABNORMAL HIGH (ref 8–23)
CO2: 23 mmol/L (ref 22–32)
Calcium: 7.3 mg/dL — ABNORMAL LOW (ref 8.9–10.3)
Chloride: 99 mmol/L (ref 98–111)
Creatinine, Ser: 7.33 mg/dL — ABNORMAL HIGH (ref 0.61–1.24)
GFR, Estimated: 8 mL/min — ABNORMAL LOW (ref >60)
Glucose, Bld: 104 mg/dL — ABNORMAL HIGH (ref 70–99)
Phosphorus: 3.7 mg/dL (ref 2.5–4.6)
Potassium: 4.2 mmol/L (ref 3.5–5.1)
Sodium: 133 mmol/L — ABNORMAL LOW (ref 135–145)

## 2023-05-02 LAB — GLUCOSE, CAPILLARY
Glucose-Capillary: 107 mg/dL — ABNORMAL HIGH (ref 70–99)
Glucose-Capillary: 98 mg/dL (ref 70–99)
Glucose-Capillary: 169 mg/dL — ABNORMAL HIGH (ref 70–99)
Glucose-Capillary: 151 mg/dL — ABNORMAL HIGH (ref 70–99)
Glucose-Capillary: 132 mg/dL — ABNORMAL HIGH (ref 70–99)

## 2023-05-02 LAB — MAGNESIUM: Magnesium: 2.3 mg/dL (ref 1.7–2.4)

## 2023-05-02 MED ORDER — SODIUM CHLORIDE 0.9 % IV SOLN
250.0000 mL | INTRAVENOUS | Status: AC
Start: 2023-05-02 — End: 2023-05-03

## 2023-05-02 MED ORDER — NOREPINEPHRINE 4 MG/250ML-% IV SOLN
0.0000 ug/min | INTRAVENOUS | Status: DC
Start: 2023-05-02 — End: 2023-05-02

## 2023-05-02 MED ORDER — MIDODRINE HCL 5 MG PO TABS
10.0000 mg | ORAL_TABLET | Freq: Three times a day (TID) | ORAL | Status: DC
Start: 2023-05-02 — End: 2023-05-13
  Administered 2023-05-02 – 2023-05-12 (×32): 10 mg via ORAL
  Filled 2023-05-02 (×32): qty 2

## 2023-05-02 MED ORDER — NOREPINEPHRINE 4 MG/250ML-% IV SOLN
2.0000 ug/min | INTRAVENOUS | Status: DC
Start: 2023-05-02 — End: 2023-05-02
  Administered 2023-05-02 (×2): 2 ug/min via INTRAVENOUS
  Filled 2023-05-02: qty 250

## 2023-05-02 MED ORDER — ORAL CARE MOUTH RINSE: 15.0000 mL | OROMUCOSAL | Status: DC | PRN

## 2023-05-02 MED ORDER — INSULIN ASPART 100 UNIT/ML IJ SOLN
0.0000 [IU] | Freq: Three times a day (TID) | INTRAMUSCULAR | Status: DC
  Administered 2023-05-02 (×2): 1 [IU] via SUBCUTANEOUS
  Administered 2023-05-03 – 2023-05-04 (×2): 2 [IU] via SUBCUTANEOUS
  Administered 2023-05-05: 1 [IU] via SUBCUTANEOUS
  Administered 2023-05-06 (×2): 2 [IU] via SUBCUTANEOUS
  Administered 2023-05-07 – 2023-05-09 (×2): 1 [IU] via SUBCUTANEOUS
  Administered 2023-05-10: 4 [IU] via SUBCUTANEOUS
  Administered 2023-05-11: 1 [IU] via SUBCUTANEOUS
  Administered 2023-05-11: 2 [IU] via SUBCUTANEOUS
  Administered 2023-05-12 – 2023-05-14 (×3): 1 [IU] via SUBCUTANEOUS

## 2023-05-02 MED ORDER — PIPERACILLIN-TAZOBACTAM IN DEX 2-0.25 GM/50ML IV SOLN
2.2500 g | Freq: Three times a day (TID) | INTRAVENOUS | Status: DC
  Administered 2023-05-02 – 2023-05-04 (×6): 2.25 g via INTRAVENOUS
  Filled 2023-05-02 (×8): qty 50

## 2023-05-02 NOTE — Progress Notes (Signed)
NAME:  Louis Butler, MRN:  [id number], DOB:  22-May-1955, LOS: 3 ADMISSION DATE:  04/29/2023, CONSULTATION DATE:  04/29/2023 REFERRING MD:  Jearld Fenton, EDP, CHIEF COMPLAINT:  ESRD; hyperkalemia; ams  History of Present Illness:  68 year old male with past medical history of ESRD (discontinued HD 06/2020, followed by Dr. 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   Patient is encephalopathic and history obtained from chart review and discussion with wife and multiple other family members (at bedside). Reported 1 week of nausea, vomiting, decreased PO intake. Additionally, symptoms of uremic Plt dysfunction noted with confusion/AMS, epistaxis and hematuria. Labs notable for Na 126, K >7.5, BUN/sCr 260/30.39, AG 21, Hgb 9.5. EKG with peaked T waves and was temporized with calcium gluconate, bicarb, insulin/dextrose, Lasix. Nephrology was consulted and recommended RRT, likely CRRT.  PCCM consulted for admission.   Pertinent  Medical History  ESRD (discontinued HD 06/2020, followed by Dr. Allena Katz), GERD, T2DM, HTN  Significant Hospital Events: Including procedures, antibiotic start and stop dates in addition to other pertinent events   1/22: Admit to ICU for renal failure, emergent HD 1/23 remains hyperkalemic; temp hd placement, transitioned to CRRT, urology consult, foley placed/ irrigated for thick debris 1/23 started on levophed 1/24 off CRRT, intermittently still on NE  Interim History / Subjective:  C/o of ongoing left ear pain for over a week, prior surgery to same ear, closed off per pt/ wife after surgery for SCC  Remains on low dose NE, weaning Having Bms now, but not eating well, doesn't like the food  Objective   Blood pressure (!) 119/54, pulse (!) 58, temperature 98.5 F (36.9 C), temperature source Axillary, resp. rate 12, height 6\' 3"  (1.905 m), weight 76.6 kg, SpO2 94%.        Intake/Output Summary (Last 24  hours) at 05/02/2023 0850 Last data filed at 05/02/2023 0600 Gross per 24 hour  Intake 580.23 ml  Output 417 ml  Net 163.23 ml   Filed Weights   04/29/23 2134 04/30/23 0709 05/01/23 0438  Weight: 76.3 kg 77.8 kg 76.6 kg   Physical Examination: General:  Chronically ill appearing elderly male sitting upright in bed in NAD, more pleasant and interactive today HEENT: MM pink/moist, pupils 3/r Neuro: Awake, oriented/ appropriate, MAE CV: rr, NSR, no murmur, R internal jugular temp HD cath, LUE AVF +b/t PULM:  non labored, CTA, room air GI: soft, bs+, NT, foley today slight pink tinged with heavy sediment, not obstructed Extremities: warm/dry, no LE edema  Skin: multiple scabs, healing to LE   Afebrile Labs> WBC 7.3, H/H 8.3/ 24> 7.7/ 23, ptls 140> 120, K 4.2, Na 133, BUN/ sCr 44/ 6.28> 50/ 7.33, Mag 2.3 UOP 1100ml/ 12hrs +159 ml/ 24hrs Net +722 ml Wts > not today Micro reviewed > UC pending  Resolved Hospital Problem List:     Assessment & Plan:  Acute-on-chronic renal failure; baseline sCr ~3.8 (10/2022).  Self discontinued dialysis 06/2020. Old LUE fistula, followed by Dr. Allena Katz ([facility]). BUN 260, Cr 30.39. K > 7.5, temporized with hyperkalemia protocol. Encephalopathic.  Progression to ESRD Hyperkalemia, resolved Hyponatremia, improving Hypocalcemia, improving Uremia, improving Hydronephrosis 2/2 obstruction due to debris Plan: - CRRT stopped 1/24, plans for iHD today, per Nephrology.  Pt agreeable to cont outpt HD - oral bicarb per nephrology  - cont to wean NE for MAP goal > 60-65 - can d/c temp HD cath when off pressors > no longer  needed per renal - increase midodrine 5> 10mg  TID - check CVP - tend BMET/ monitor electrolytes - Urology following> plans to keep foley for now, f/u outpt.  Monitor for obstruction due to ongoing sediment, flush prn   Shock- multifactorial, sepsis, malnutrition, sedating meds  - off NE yest but back on overnight, adjusting midodrine, goal  MAP > 60-65 - pending UC - continuing zosyn for now.  Pt c/o of pain in left ear> followed at [facility], EMR confirms left ear canal closed over.  Consider imaging if ongoing.  Pt worried about infection vs progression, zosyn should cover any infection but no evidence> remains afebrile, wbc wnl - check CVP - cont zosyn, day 3 given hx of pseudomonal UTI pending UC - trend fever curve/ CBC   Acute metabolic encephalopathy in the setting of uremia, resolved Plan: - limit sedating meds - monitor   T2DM  Plan: - change SSI to AC/ HS prn, well controlled  Hypertension  Home medications: amlodipine, carvedilol. 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  Last seen 01/2023, not f/u as pt did not want to go Plan: - recommend pt follow up with [facility]/ oncology outpt  Stage 2 sacral ulcer POA Protein calorie malnutrition Plan: - WOC, frequent turns, maximize nutrition> RD consult - PT/ OT  GOC - pt wishes to remain full code and continue outpt dialysis.  Not interested in PMT consult 1/24.   Best Practice (right click and "Reselect all SmartList Selections" daily)   Diet/type: Regular consistency (see orders)> renal DVT prophylaxis: prophylactic heparin  Pressure ulcer(s): stage 2 sacral POA GI prophylaxis: PPI Lines: Dialysis Catheter- temp HD R internal jugular 1/23 Foley:  Yes, and it is still needed> will d/c w/ per urology recs Code Status:  full code Last date of multidisciplinary goals of care discussion [1/24]  Pt and wife updated at bedside 1/25  Labs   CBC: Recent Labs  Lab 04/29/23 1744 04/30/23 0410  05/01/23 0443 05/02/23 0423  WBC 8.2 6.5 8.3 7.3  HGB 9.5* 8.2* 8.3* 7.7*  HCT 28.6* 23.7* 24.6* 23.4*  MCV 90.2 87.5 88.2 92.1  PLT 162 133* 140* 127*   Basic Metabolic Panel: Recent Labs  Lab 04/30/23 0410 04/30/23 0411 04/30/23 1622 04/30/23 2224 05/01/23 0443 05/01/23 1623 05/02/23 0423  NA  --    < > 133* 132* 134* 133* 133*  K  --    < > 4.5 4.7 4.6 4.3 4.2  CL  --    < > 97* 99 101 99 99  CO2  --    < > 20* 18* 21* 20* 23  GLUCOSE  --    < > 122* 164* 144* 154* 104*  BUN  --    < > 109* 83* 58* 44* 50*  CREATININE  --    < > 12.45* 10.02* 7.13* 6.28* 7.33*  CALCIUM 6.5*   < > 7.7* 7.4* 7.5* 7.5* 7.3*  MG 2.5*  --   --   --  2.2  --  2.3  PHOS  --    < > 5.5* 4.4 3.5 3.0 3.7   < > =  values in this interval not displayed.   GFR: Estimated Creatinine Clearance: 10.6 mL/min (A) (by C-G formula based on SCr of 7.33 mg/dL (H)). Recent Labs  Lab 04/29/23 1744 04/30/23 0410 05/01/23 0443 05/02/23 0423  WBC 8.2 6.5 8.3 7.3   Liver Function Tests: Recent Labs  Lab 04/30/23 2052 04/30/23 2224 05/01/23 0443 05/01/23 1623 05/02/23 0423  AST 15  --   --   --   --   ALT 11  --   --   --   --   ALKPHOS 47  --   --   --   --   BILITOT 1.2  --   --   --   --   PROT 7.0  --   --   --   --   ALBUMIN 2.4* 2.5* 2.5* 2.4* 2.3*   No results for input(s): "LIPASE", "AMYLASE" in the last 168 hours. Recent Labs  Lab 04/30/23 0955  AMMONIA 17    ABG:    Component Value Date/Time   TCO2 28 01/04/2020 0805    Coagulation Profile: Recent Labs  Lab 04/29/23 2134  INR 1.3*    Cardiac Enzymes: No results for input(s): "CKTOTAL", "CKMB", "CKMBINDEX", "TROPONINI" in the last 168 hours.  HbA1C: Hemoglobin A1C  Date/Time Value Ref Range Status  06/29/2020 12:00 AM 7.0  Final   HbA1c, POC (prediabetic range)  Date/Time Value Ref Range Status  10/22/2022 01:57 PM 6.3 5.7 - 6.4 % Final   HbA1c, POC (controlled diabetic range)  Date/Time Value Ref Range Status   05/01/2022 10:18 AM 6.6 0.0 - 7.0 % Final  01/30/2022 09:39 AM 6.3 0.0 - 7.0 % Final   Hgb A1c MFr Bld  Date/Time Value Ref Range Status  04/30/2023 04:10 AM 6.8 (H) 4.8 - 5.6 % Final    Comment:    (NOTE) Pre diabetes:          5.7%-6.4%  Diabetes:              >6.4%  Glycemic control for   <7.0% adults with diabetes    CBG: Recent Labs  Lab 05/01/23 1605 05/01/23 2014 05/01/23 2325 05/02/23 0330 05/02/23 0715  GLUCAP 119* 134* 104* 107* 98   Allergies: Allergies  Allergen Reactions   Hydrocodone Hives and Itching   Percocet [Oxycodone-Acetaminophen] Hives and Itching    Home Medications: Prior to Admission medications   Medication Sig Start Date End Date Taking? Authorizing Provider  acetaminophen (TYLENOL) 500 MG tablet Take 1,000 mg by mouth every 6 (six) hours as needed for headache (pain).     [provider]  amLODipine (NORVASC) 5 MG tablet Take 1 tablet (5 mg total) by mouth at bedtime. 10/22/22   Latrelle Dodrill, MD  blood glucose meter kit and supplies KIT Dispense based on patient and insurance preference. Use up to four times daily as directed. (FOR ICD-9 250.00, 250.01). 03/28/19   Hongalgi, Maximino Greenland, MD  carvedilol (COREG) 25 MG tablet Take 25 mg by mouth 2 (two) times daily with a meal.    [provider]  Cholecalciferol (VITAMIN D3) 125 MCG (5000 UT) CAPS Take 5,000 Units by mouth 2 (two) times a week.    [provider]  esomeprazole (NEXIUM) 40 MG capsule TAKE 1 CAPSULE BY MOUTH ONCE DAILY AT  NOON Patient taking differently: Take 40 mg by mouth daily as needed. 08/23/21   Mansouraty, Netty Starring., MD  famotidine (PEPCID) 20 MG tablet Take one tablet by mouth daily  01/02/23   Latrelle Dodrill, MD  gabapentin (NEURONTIN) 300 MG capsule Take 1 capsule (300 mg total) by mouth at bedtime. 01/12/23   Latrelle Dodrill, MD  hydrocortisone 2.5 % cream Apply to affected area twice daily as needed 01/21/23   Latrelle Dodrill, MD  Insulin Syringes, Disposable, U-100 0.5 ML MISC Use as per instructions 3 times daily AC. 03/28/19   Hongalgi, Maximino Greenland, MD  LANTUS SOLOSTAR 100 UNIT/ML Solostar Pen Inject 6 units subcutaneously once daily 01/21/23   Latrelle Dodrill, MD  loperamide (IMODIUM A-D) 2 MG tablet Take 1 tablet (2 mg total) by mouth 4 (four) times daily as needed for diarrhea or loose stools. 06/12/20   Latrelle Dodrill, MD  sodium bicarbonate 650 MG tablet Take 650 mg by mouth 2 (two) times daily.    [provider]  tamsulosin (FLOMAX) 0.4 MG CAPS capsule Take 0.4 mg by mouth daily.    [provider]  traMADol (ULTRAM) 50 MG tablet Take 1 tablet (50 mg total) by mouth every 6 (six) hours as needed. 02/26/22   Latrelle Dodrill, MD  trimethoprim (TRIMPEX) 100 MG tablet Take 100 mg by mouth daily. 01/08/22   [provider]  VELTASSA 8.4 g packet Take 1 packet by mouth daily.    [provider]   Critical care time: 36 mins        Posey Boyer, MSN, AG-ACNP-BC [facility] 05/02/2023, 8:50 AM  See Amion for pager If no response to pager , please call [phone] until 7pm After 7:00 pm call Elink  [phone]

## 2023-05-02 NOTE — Discharge Instructions (Addendum)
 Dear Louis Butler,  Thank you for letting us participate in your care. You were hospitalized and diagnosed with ESRD (end stage renal disease) (HCC). You were treated with scheduled dialysis. You were also have found to have orthostatic hypotension. You were given medications to help better control your drop in blood pressures. You were also treated for a urinary tract infection. The urologist evaluated you while in the hospital and recommended keeping your foley in place. Your urinary tract infection was treated with antibiotics.    POST-HOSPITAL & CARE INSTRUCTIONS Do not remove Foley catheter unless discussed with urology first. Please follow up with them for catheter exchange.  Please ensure follow-up with [facility] We recommend following up with your PCP within 1 week from being discharged from the hospital. Stage II sacral ulcer, monitor and refer to wound care clinic if necessary Go to your follow up appointments (listed below)  DOCTOR'S APPOINTMENT   No future appointments.  Contact information for follow-up providers     [facility] Follow up.   Specialty: Home Health Services Why: Someone will call you to schedule first home visit. Contact information: [address] [place] [address] [phone]         [facility].   Why: Schedule is Monday, Wednesday, Friday with 12:25 pm chair time.   For first treatment, please arrive at 11:30 am to complete paperwork prior to treatment. Contact information: [address] [place] [address] [phone]              Contact information for after-discharge care     Destination     [facility] Preferred SNF .   Service: Skilled Nursing Contact information: [address] [place] [address] [phone]                     Take care and be well!  Family Medicine Teaching Service Inpatient Team [place]   [facility]  [address] [place], [place] [address] [phone]

## 2023-05-02 NOTE — Progress Notes (Addendum)
1935: This RN called CT to organize getting pt down for ordered routine CT. CT to give this RN a call when they are ready for him. Patient and family updated.   2030: This RN had long discussion with patient's wife and daughter regarding patient's wishes. Wife states that patient is tired and has expressed feelings of no longer wanting to continue treatment. I explained to the wife that currently the patient is a full code, and if his heart were to stop, CPR would be performed and pt would likely be placed on life support. Patient's wife stated that she doesn't believe he would want this. This RN recommended having CCM come discuss code status and care plan with patient to make sure his wishes are being conveyed. Shortly after, wife stepped back in the room to talk with patient. Patient's wife asked patient if he wished to talk to the doctor about stopping treatment and patient stated "I said I would do this shit, now don't ask me another damn time about it." Patient's wife stated that we can push discussion off until morning.   1145: Pt transported to and from CT without issue.   0600: This morning patient seems to be in much better spirits. Pt more agreeable to care, apologizing for his behavior over the past couple days, and thanking staff for the care. Patient states "I'll do what ya'll tell me I need to do so I can get home."

## 2023-05-02 NOTE — Progress Notes (Signed)
Nutrition Follow-up  DOCUMENTATION CODES:   Non-severe (moderate) malnutrition in context of chronic illness  INTERVENTION:   Continue Boost Breeze po BID, each supplement provides 250 kcal and 9 grams of protein (mix with ginger ale per patient preference).  Continue regular diet, continue 1200 ml fluid restriction.  Encourage POs (family to bring in food). Continue Renal MVI daily. RD provided "Nutrition for People on Dialysis" from [facility]; attached to AVS/ discharge summary; pt will also have access to RD at outpatient HD center  NUTRITION DIAGNOSIS:   Moderate Malnutrition related to chronic illness (renal disease) as evidenced by moderate fat depletion, mild muscle depletion, moderate muscle depletion, percent weight loss (16% weight loss within 6 months).  Ongoing  GOAL:   Patient will meet greater than or equal to 90% of their needs  Progressing  MONITOR:   PO intake, Supplement acceptance, Skin  REASON FOR ASSESSMENT:   Consult Assessment of nutrition requirement/status, Diet education  ASSESSMENT:   68 yo male admitted with acute on chronic renal failure. PMH includes ESRD-stopped HD 06/2020, DM-2, GERD, HTN.  1/24- CRRT d/c  Reviewed I/O's: +160 ml x 24 hours and +722 ml since admission  UOP: 240 ml x 24 hours  CRRT: 207 ml x 24 hours  Pt unavailable at time of visit. Attempted to speak with pt via call to hospital room phone, however, unable to reach.   Plan for CT of temporal bones; pt with history of squamous cell carcinoma s/p lt neck dissection, complains of ear pain.   Per nephrology notes, plan to transition to intermittent HD today. CLIP process has been initiated. Pt amenable to HD if he can  Be followed by [facility] with Dr. Allena Katz as primary nephrologist.   Pt currently on a regular diet with 1.2 L fluid restriction. RD agrees with liberalized diet secondary to malnutrition and poor oral intake. No meal  completion data available to assess at this time.   Medications reviewed and include vitamin D3, neurontin, melatonin, and protonix.   Labs reviewed: Na: 133, CBGS: 98-169 (inpatient orders for glycemic control are 0-6 units insulin aspart TID with meals).    Diet Order:   Diet Order             Diet regular Room service appropriate? Yes; Fluid consistency: Thin; Fluid restriction: 1200 mL Fluid  Diet effective now                   EDUCATION NEEDS:   Education needs have been addressed  Skin:  Skin Assessment: Skin Integrity Issues: Skin Integrity Issues:: Stage II Stage II: R buttocks  Last BM:  05/02/23 (type 7)  Height:   Ht Readings from Last 1 Encounters:  04/29/23 6\' 3"  (1.905 m)    Weight:   Wt Readings from Last 1 Encounters:  05/01/23 76.6 kg    Ideal Body Weight:  89.1 kg  BMI:  Body mass index is 21.11 kg/m.  Estimated Nutritional Needs:   Kcal:  2200-2400  Protein:  110-120 gm  Fluid:  1 L + UOP    Ilyse Tremain W, RD, LDN, CDCES Registered Dietitian III Certified Diabetes Care and Education Specialist If unable to reach this RD, please use "RD Inpatient" group chat on secure chat between hours of 8am-4 pm daily

## 2023-05-02 NOTE — Progress Notes (Signed)
Family Medicine Teaching Service PCP Social Visit  Stopped by briefly this AM to speak with Smitty Cords and Bjorn Loser. He looks much better today. They shared he is likely coming out of ICU tomorrow. Assured them FPTS will take over his care once out of ICU. I sincerely appreciate the care of the CCM and nephrology teams.  Levert Feinstein, MD, [facility]

## 2023-05-02 NOTE — Evaluation (Signed)
Physical Therapy Evaluation Patient Details Name: Louis Butler MRN: [id number] DOB: 15-Apr-1955 Today's Date: 05/02/2023  History of Present Illness  68 y.o. male presented 1/22 with N/V/abd pain and was found to have marked hyperkalemia and renal failure. CRRT 1/23-1/24. 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 Ricard Dillon), DM, HTN, recent squamous cell ca H&N (surgery, XRT 2024), anemia.  Clinical Impression  Pt admitted with above diagnosis. Performing fair amount of ADLs on his own at home PTA but notices things have gotten more difficult recently; has daughter in-law do cooking and cleaning now. States he has good family support at home 24/7. Hopeful to return home with HHPT follow-up. Agreeable to ST SNF if he does not progress well enough to safely mobilize. Primarily limited by drop in BP today from 109/56 HR 71 in supine, to 88/59 HR 87 seated on BSC. Symptomatic with dizziness, palor. This gradully improved and was able to have a BM in [facility] before returning to bed where BP improved to 129/64. Will continue to progress acutely, as tolerated, and update recs as appropriate. RN notified. Pt currently with functional limitations due to the deficits listed below (see PT Problem List). Pt will benefit from acute skilled PT to increase their independence and safety with mobility to allow discharge.           If plan is discharge home, recommend the following: A little help with walking and/or transfers;A little help with bathing/dressing/bathroom;Assistance with cooking/housework;Direct supervision/assist for medications management;Direct supervision/assist for financial management;Assist for transportation;Help with stairs or ramp for entrance;Supervision due to cognitive status   Can travel by private vehicle        Equipment Recommendations None recommended by PT  Recommendations for Other Services       Functional Status Assessment Patient has had a recent decline in their functional status and demonstrates  the ability to make significant improvements in function in a reasonable and predictable amount of time.     Precautions / Restrictions Precautions Precautions: Fall Precaution Comments: Monitor BP Restrictions Weight Bearing Restrictions Per Provider Order: No      Mobility  Bed Mobility Overal bed mobility: Needs Assistance Bed Mobility: Supine to Sit, Sit to Supine     Supine to sit: Contact guard Sit to supine: Contact guard assist   General bed mobility comments: CGA for safety, managed lines/leads, cues for awareness, required extra time. Able to lift LEs back into bed with effort.    Transfers Overall transfer level: Needs assistance Equipment used: Rolling walker (2 wheels), 1 person hand held assist Transfers: Sit to/from Stand, Bed to chair/wheelchair/BSC Sit to Stand: Contact guard assist   Step pivot transfers: Contact guard assist       General transfer comment: CGA for safety, assisted with lines/leads, cues for awareness and technique. HHA with step pivot to BSC from bed, not fully upright with stance. CGA for step pivot transfer back to bed, better upright stand with this device. No buckling. Fatigues quickly though.    Ambulation/Gait               General Gait Details: deferred due drop in BP and bowel urgency  Stairs            Wheelchair Mobility     Tilt Bed    Modified Rankin (Stroke Patients Only)       Balance  Pertinent Vitals/Pain Pain Assessment Pain Assessment: No/denies pain    Home Living Family/patient expects to be discharged to:: Private residence Living Arrangements: Spouse/significant other;Children;Other relatives Available Help at Discharge: Family;Available 24 hours/day Type of Home: House Home Access: Ramped entrance       Home Layout: One level Home Equipment: Agricultural consultant (2 wheels);Rollator (4 wheels);Cane - single point;Shower  seat;Wheelchair - manual      Prior Function Prior Level of Function : Needs assist             Mobility Comments: Reports independent until recently. Denies falls ADLs Comments: Reports independent until recently, DIL cooks and cleans. He is able to bath/dress himself.     Extremity/Trunk Assessment   Upper Extremity Assessment Upper Extremity Assessment: Defer to OT evaluation    Lower Extremity Assessment Lower Extremity Assessment: Generalized weakness       Communication   Communication Communication: Hearing impairment (Lt ear surgically closed) Cueing Techniques: Verbal cues;Gestural cues  Cognition Arousal: Alert Behavior During Therapy: WFL for tasks assessed/performed Overall Cognitive Status: No family/caregiver present to determine baseline cognitive functioning                                 General Comments: Following commands appropriately. Some delayed processing.        General Comments General comments (skin integrity, edema, etc.): BP supine 109/56. After transfer to [facility] pt dizzy and BP 88/59. Returned to bed (reclined slightly) BP 129/64 at end of session, symptoms resolved. Assisted to [facility] due to bowel urgency. Assisted with peri-care in standing, holding RW. CGA    Exercises General Exercises - Lower Extremity Ankle Circles/Pumps: AROM, Both, 10 reps, Supine Quad Sets: Strengthening, Both, 10 reps, Supine Gluteal Sets: Strengthening, Both, 10 reps, Supine Straight Leg Raises: Strengthening, Both, 10 reps, Supine   Assessment/Plan    PT Assessment Patient needs continued PT services  PT Problem List Decreased strength;Decreased activity tolerance;Decreased balance;Decreased mobility;Decreased cognition;Decreased knowledge of use of DME;Decreased knowledge of precautions;Cardiopulmonary status limiting activity       PT Treatment Interventions DME instruction;Gait training;Functional mobility training;Therapeutic  activities;Therapeutic exercise;Neuromuscular re-education;Balance training;Cognitive remediation;Patient/family education;Wheelchair mobility training    PT Goals (Current goals can be found in the Care Plan section)  Acute Rehab PT Goals Patient Stated Goal: Get well, go home. Okay with rehab if he doesn't feel strong enough to go home though. PT Goal Formulation: With patient Time For Goal Achievement: 05/22/23 Potential to Achieve Goals: Fair    Frequency Min 1X/week     Co-evaluation               AM-PAC PT "6 Clicks" Mobility  Outcome Measure Help needed turning from your back to your side while in a flat bed without using bedrails?: None Help needed moving from lying on your back to sitting on the side of a flat bed without using bedrails?: A Little Help needed moving to and from a bed to a chair (including a wheelchair)?: A Little Help needed standing up from a chair using your arms (e.g., wheelchair or bedside chair)?: A Little Help needed to walk in hospital room?: A Lot Help needed climbing 3-5 steps with a railing? : A Lot 6 Click Score: 17    End of Session Equipment Utilized During Treatment: Gait belt Activity Tolerance: Treatment limited secondary to medical complications (Comment) (Drop in BP, bowel urgency) Patient left: in chair Nurse Communication: Mobility status PT  Visit Diagnosis: Unsteadiness on feet (R26.81);Muscle weakness (generalized) (M62.81);Difficulty in walking, not elsewhere classified (R26.2);Dizziness and giddiness (R42)    Time: 6578-4696 PT Time Calculation (min) (ACUTE ONLY): 33 min   Charges:   PT Evaluation $PT Eval Low Complexity: 1 Low PT Treatments $Therapeutic Activity: 8-22 mins PT General Charges $$ ACUTE PT VISIT: 1 Visit         Kathlyn Sacramento, PT, DPT [facility]  Rehabilitation Services Physical Therapist Office: [phone] Website: [email or web address]   Berton Mount 05/02/2023, 4:11 PM

## 2023-05-02 NOTE — Progress Notes (Signed)
Louis Butler is an 68 y.o. male with CKD 5 (f/b Dr. Ricard Dillon), DM, HTN, recent squamous cell ca H&N (surgery, XRT 2024), anemia who presented with N/V/abd pain and was found to have marked hyperkalemia and renal failure for which nephrology is consulted.  Was on HD 2021-06/2020 when he self discontinued and has followed in clinic since.  Last OV with Dr. Allena Katz was 02/19/24 - GFR11, Hb 9.6.  Pt unclear if he would be accepting of long term dialysis.    Assessment/Plan: 1) CKD 5 with AKI vs progression to ESRD:  Longstanding advanced CKD, was on dialysis 2022 but stopped and has followed with Dr. Allena Katz since; most recent GFR 02/2023 11. By history seems patient may have had progression of CKD which led to his acute presentation though there is some concern for acute GI illness potentiating AKI.  Either way emergent dialysis is indicated now to which he is agreeable.    - Tolerated iHD evening of 1/22 but only 2 hours @ low efficiency given very high risk for dialysis states disequilibrium; appreciate CCV placing temp cath. Good enough clearance now and will rinse back. Plan on 3hr lower efficiency dialysis today.  CLIP process started; he is ESRD. He only wants to go to [facility] for Dr. Allena Katz to be his nephrologist. I tried d/w the spouse and she stated he is very difficult and can be argumentative. If having to drive to [facility] means the pt will be compliant then she's willing to do it.  2) Hyperkalemia: CRRT now; had med management in ED -> better.    3) Hyponatremia: suspect hypovolemic, IVF for now; CRRT will also help correct.    4) HTN: on coreg baseline, hold in setting of marginal BP   5) AGMA: in setting of severe AKI, defer VBG to PCCM; can give bicarb pushes PRN, CRRT will correct.    6) Anemia: Hb 9.5, add iron indices.  Would clarify his H&N ca status prior to ESA administration.     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 Has rec'd IV ca supplement.     Subjective: Denies fever chills sob. Oriented to place and time. Updated spouse who was bedside.   Chemistry and CBC: Creatinine, Ser  Date/Time Value Ref Range Status  05/02/2023 04:23 AM 7.33 (H) 0.61 - 1.24 mg/dL Final  29/52/8413 24:40 PM 6.28 (H) 0.61 - 1.24 mg/dL Final  02/01/2535 64:40 AM 7.13 (H) 0.61 - 1.24 mg/dL Final  34/74/2595 63:87 PM 10.02 (H) 0.61 - 1.24 mg/dL Final  56/43/3295 18:84 PM 12.45 (H) 0.61 - 1.24 mg/dL Final    Comment:    DIALYSIS RESULTS CHECKED   04/30/2023 04:12 AM 26.68 (H) 0.61 - 1.24 mg/dL Final    Comment:    RESULT CONFIRMED BY MANUAL DILUTION  04/30/2023 12:00 AM 26.60 (H) 0.61 - 1.24 mg/dL Corrected    Comment:    RESULT CONFIRMED BY MANUAL DILUTION CORRECTED RESULTS CALLED TO: M.MEJIA 0046 04/30/2023 BY G.GANADEN CORRECTED ON 01/23 AT 0046: PREVIOUSLY REPORTED AS 26.10 RESULT CONFIRMED BY MANUAL DILUTION   04/29/2023 05:44 PM 30.39 (H) 0.61 - 1.24 mg/dL Final    Comment:    RESULTS CONFIRMED BY MANUAL DILUTION  08/06/2022 09:07 AM 5.58 (H) 0.61 - 1.24 mg/dL Final  16/60/6301 60:10 PM 5.03 (H) 0.76 - 1.27 mg/dL Final  93/23/5573 22:02 PM 5.07 (H) 0.76 - 1.27 mg/dL Final  54/27/0623 76:28 PM 5.09 (H) 0.76 - 1.27 mg/dL Final  31/51/7616  11:50 AM 4.22 (H) 0.76 - 1.27 mg/dL Final  16/01/9603 54:09 PM 4.57 (H) 0.76 - 1.27 mg/dL Final  81/19/1478 29:56 PM 3.42 (H) 0.61 - 1.24 mg/dL Final  21/30/8657 84:69 AM 3.60 (H) 0.61 - 1.24 mg/dL Final  62/95/2841 32:44 AM 3.20 (H) 0.61 - 1.24 mg/dL Final  04/09/7251 66:44 AM 4.47 (H) 0.61 - 1.24 mg/dL Final  03/47/4259 56:38 PM 4.62 (H) 0.61 - 1.24 mg/dL Final  75/64/3329 51:88 PM 5.15 (H) 0.61 - 1.24 mg/dL Final  41/66/0630 16:01 PM 4.41 (H) 0.76 - 1.27 mg/dL Final  09/32/3557 32:20 AM 4.34 (H) 0.61 - 1.24 mg/dL Final  25/42/7062 37:62 AM 4.27 (H) 0.61 - 1.24 mg/dL Final  83/15/1761 60:73 AM 4.20 (H) 0.61 - 1.24 mg/dL Final  71/09/2692 85:46 AM 3.96 (H) 0.61 - 1.24 mg/dL Final  27/06/5007  38:18 PM 3.94 (H) 0.61 - 1.24 mg/dL Final  29/93/7169 67:89 AM 4.70 (H) 0.61 - 1.24 mg/dL Final  38/01/1750 02:58 AM 4.75 (H) 0.61 - 1.24 mg/dL Final  52/77/8242 35:36 AM 5.06 (H) 0.61 - 1.24 mg/dL Final  14/43/1540 08:67 AM 5.23 (H) 0.61 - 1.24 mg/dL Final  61/95/0932 67:12 AM 5.70 (H) 0.61 - 1.24 mg/dL Final  45/80/9983 38:25 AM 5.75 (H) 0.61 - 1.24 mg/dL Final  05/39/7673 41:93 AM 5.67 (H) 0.61 - 1.24 mg/dL Final  79/05/4095 35:32 AM 5.37 (H) 0.61 - 1.24 mg/dL Final  99/24/2683 41:96 AM 4.88 (H) 0.61 - 1.24 mg/dL Final  22/29/7989 21:19 AM 4.54 (H) 0.61 - 1.24 mg/dL Final  41/74/0814 48:18 AM 4.71 (H) 0.61 - 1.24 mg/dL Final  56/31/4970 26:37 AM 4.55 (H) 0.61 - 1.24 mg/dL Final  85/88/5027 74:12 AM 4.29 (H) 0.61 - 1.24 mg/dL Final  87/86/7672 09:47 AM 4.80 (H) 0.61 - 1.24 mg/dL Final  09/62/8366 29:47 PM 4.80 (H) 0.61 - 1.24 mg/dL Final  65/46/5035 46:56 AM 4.93 (H) 0.61 - 1.24 mg/dL Final  81/27/5170 01:74 PM 5.24 (H) 0.61 - 1.24 mg/dL Final  94/49/6759 16:38 AM 1.20 0.61 - 1.24 mg/dL Final  46/65/9935 70:17 PM 0.80 0.61 - 1.24 mg/dL Final  79/39/0300 92:33 AM 1.02 0.61 - 1.24 mg/dL Final  00/76/2263 33:54 AM 0.90 0.61 - 1.24 mg/dL Final  56/25/6389 37:34 AM 0.87 0.61 - 1.24 mg/dL Final  28/76/8115 72:62 AM 0.89 0.61 - 1.24 mg/dL Final  03/55/9741 63:84 PM 0.86 0.61 - 1.24 mg/dL Final  53/64/6803 21:22 AM 1.01 0.61 - 1.24 mg/dL Final   Recent Labs  Lab 04/30/23 0000 04/30/23 0410 04/30/23 0411 04/30/23 0412 04/30/23 0858 04/30/23 1622 04/30/23 2224 05/01/23 0443 05/01/23 1623 05/02/23 0423  NA 129*  --   --  124*  --  133* 132* 134* 133* 133*  K 6.7*  --    < > 5.4* 3.9 4.5 4.7 4.6 4.3 4.2  CL 96*  --   --  92*  --  97* 99 101 99 99  CO2 12*  --   --  13*  --  20* 18* 21* 20* 23  GLUCOSE 123*  --   --  144*  --  122* 164* 144* 154* 104*  BUN 248*  --   --  258*  --  109* 83* 58* 44* 50*  CREATININE 26.60*  --   --  26.68*  --  12.45* 10.02* 7.13* 6.28* 7.33*  CALCIUM  6.8* 6.5*  --  6.4*  --  7.7* 7.4* 7.5* 7.5* 7.3*  PHOS  --   --   --  >30.0*  --  5.5* 4.4 3.5 3.0 3.7   < > = values in this interval not displayed.   Recent Labs  Lab 04/29/23 1744 04/30/23 0410 05/01/23 0443 05/02/23 0423  WBC 8.2 6.5 8.3 7.3  HGB 9.5* 8.2* 8.3* 7.7*  HCT 28.6* 23.7* 24.6* 23.4*  MCV 90.2 87.5 88.2 92.1  PLT 162 133* 140* 127*   Liver Function Tests: Recent Labs  Lab 04/30/23 2052 04/30/23 2224 05/01/23 0443 05/01/23 1623 05/02/23 0423  AST 15  --   --   --   --   ALT 11  --   --   --   --   ALKPHOS 47  --   --   --   --   BILITOT 1.2  --   --   --   --   PROT 7.0  --   --   --   --   ALBUMIN 2.4*   < > 2.5* 2.4* 2.3*   < > = values in this interval not displayed.   No results for input(s): "LIPASE", "AMYLASE" in the last 168 hours. Recent Labs  Lab 04/30/23 0955  AMMONIA 17   Cardiac Enzymes: No results for input(s): "CKTOTAL", "CKMB", "CKMBINDEX", "TROPONINI" in the last 168 hours. Iron Studies:  Recent Labs    04/30/23 0410  IRON 128  TIBC 143*  FERRITIN 648*   PT/INR: @LABRCNTIP (inr:5)  Xrays/Other Studies: ) Results for orders placed or performed during the hospital encounter of 04/29/23 (from the past 48 hours)  Potassium     Status: None   Collection Time: 04/30/23  8:58 AM  Result Value Ref Range   Potassium 3.9 3.5 - 5.1 mmol/L    Comment: Performed at [facility], [address], [place], [place] [address]  Ammonia     Status: None   Collection Time: 04/30/23  9:55 AM  Result Value Ref Range   Ammonia 17 9 - 35 umol/L    Comment: Performed at [facility], [address] [place], [place] [address]  Glucose, capillary     Status: None   Collection Time: 04/30/23 12:01 PM  Result Value Ref Range   Glucose-Capillary 85 70 - 99 mg/dL    Comment: Glucose reference range applies only to samples taken after fasting for at least 8 hours.  Glucose, capillary     Status: Abnormal   Collection Time: 04/30/23  3:51  PM  Result Value Ref Range   Glucose-Capillary 105 (H) 70 - 99 mg/dL    Comment: Glucose reference range applies only to samples taken after fasting for at least 8 hours.  Renal function panel (daily at 1600)     Status: Abnormal   Collection Time: 04/30/23  4:22 PM  Result Value Ref Range   Sodium 133 (L) 135 - 145 mmol/L    Comment: DELTA CHECK NOTED   Potassium 4.5 3.5 - 5.1 mmol/L   Chloride 97 (L) 98 - 111 mmol/L   CO2 20 (L) 22 - 32 mmol/L   Glucose, Bld 122 (H) 70 - 99 mg/dL    Comment: Glucose reference range applies only to samples taken after fasting for at least 8 hours.   BUN 109 (H) 8 - 23 mg/dL   Creatinine, Ser 14.78 (H) 0.61 - 1.24 mg/dL    Comment: DIALYSIS RESULTS CHECKED    Calcium 7.7 (L) 8.9 - 10.3 mg/dL   Phosphorus 5.5 (H) 2.5 - 4.6 mg/dL   Albumin 2.6 (L) 3.5 - 5.0 g/dL  GFR, Estimated 4 (L) >60 mL/min    Comment: (NOTE) Calculated using the CKD-EPI Creatinine Equation (2021)    Anion gap 16 (H) 5 - 15    Comment: Performed at [facility], [address], [place], [place] [address]  Urine Culture (for pregnant, neutropenic or urologic patients or patients with an indwelling urinary catheter)     Status: None (Preliminary result)   Collection Time: 04/30/23  5:29 PM   Specimen: Urine, Clean Catch  Result Value Ref Range   Specimen Description URINE, CLEAN CATCH    Special Requests NONE    Culture      CULTURE REINCUBATED FOR BETTER GROWTH Performed at [facility], [address] [place], [place] [address]    Report Status PENDING   Glucose, capillary     Status: Abnormal   Collection Time: 04/30/23  8:06 PM  Result Value Ref Range   Glucose-Capillary 122 (H) 70 - 99 mg/dL    Comment: Glucose reference range applies only to samples taken after fasting for at least 8 hours.  Hepatic function panel     Status: Abnormal   Collection Time: 04/30/23  8:52 PM  Result Value Ref Range   Total Protein 7.0 6.5 - 8.1 g/dL   Albumin 2.4 (L)  3.5 - 5.0 g/dL   AST 15 15 - 41 U/L   ALT 11 0 - 44 U/L   Alkaline Phosphatase 47 38 - 126 U/L   Total Bilirubin 1.2 0.0 - 1.2 mg/dL   Bilirubin, Direct 0.3 (H) 0.0 - 0.2 mg/dL   Indirect Bilirubin 0.9 0.3 - 0.9 mg/dL    Comment: Performed at [facility], [address], [place], [place] [address]  Renal function panel     Status: Abnormal   Collection Time: 04/30/23 10:24 PM  Result Value Ref Range   Sodium 132 (L) 135 - 145 mmol/L   Potassium 4.7 3.5 - 5.1 mmol/L   Chloride 99 98 - 111 mmol/L   CO2 18 (L) 22 - 32 mmol/L   Glucose, Bld 164 (H) 70 - 99 mg/dL    Comment: Glucose reference range applies only to samples taken after fasting for at least 8 hours.   BUN 83 (H) 8 - 23 mg/dL   Creatinine, Ser 91.47 (H) 0.61 - 1.24 mg/dL   Calcium 7.4 (L) 8.9 - 10.3 mg/dL   Phosphorus 4.4 2.5 - 4.6 mg/dL   Albumin 2.5 (L) 3.5 - 5.0 g/dL   GFR, Estimated 5 (L) >60 mL/min    Comment: (NOTE) Calculated using the CKD-EPI Creatinine Equation (2021)    Anion gap 15 5 - 15    Comment: Performed at [facility], [address], [place], [place] [address]  Glucose, capillary     Status: Abnormal   Collection Time: 05/01/23 12:01 AM  Result Value Ref Range   Glucose-Capillary 145 (H) 70 - 99 mg/dL    Comment: Glucose reference range applies only to samples taken after fasting for at least 8 hours.  Glucose, capillary     Status: Abnormal   Collection Time: 05/01/23  4:17 AM  Result Value Ref Range   Glucose-Capillary 185 (H) 70 - 99 mg/dL    Comment: Glucose reference range applies only to samples taken after fasting for at least 8 hours.  Renal function panel (daily at 0500)     Status: Abnormal   Collection Time: 05/01/23  4:43 AM  Result Value Ref Range  Sodium 134 (L) 135 - 145 mmol/L   Potassium 4.6 3.5 - 5.1 mmol/L   Chloride 101 98 - 111 mmol/L   CO2 21 (L) 22 - 32 mmol/L   Glucose, Bld 144 (H) 70 - 99 mg/dL    Comment: Glucose reference range applies only to samples  taken after fasting for at least 8 hours.   BUN 58 (H) 8 - 23 mg/dL   Creatinine, Ser 6.96 (H) 0.61 - 1.24 mg/dL   Calcium 7.5 (L) 8.9 - 10.3 mg/dL   Phosphorus 3.5 2.5 - 4.6 mg/dL   Albumin 2.5 (L) 3.5 - 5.0 g/dL   GFR, Estimated 8 (L) >60 mL/min    Comment: (NOTE) Calculated using the CKD-EPI Creatinine Equation (2021)    Anion gap 12 5 - 15    Comment: Performed at [facility], [address], [place], [place] [address]  Magnesium     Status: None   Collection Time: 05/01/23  4:43 AM  Result Value Ref Range   Magnesium 2.2 1.7 - 2.4 mg/dL    Comment: Performed at [facility], [address], [place], [place] [address]  CBC     Status: Abnormal   Collection Time: 05/01/23  4:43 AM  Result Value Ref Range   WBC 8.3 4.0 - 10.5 K/uL   RBC 2.79 (L) 4.22 - 5.81 MIL/uL   Hemoglobin 8.3 (L) 13.0 - 17.0 g/dL   HCT 40.1 (L) 02.7 - 25.3 %   MCV 88.2 80.0 - 100.0 fL   MCH 29.7 26.0 - 34.0 pg   MCHC 33.7 30.0 - 36.0 g/dL   RDW 66.4 40.3 - 47.4 %   Platelets 140 (L) 150 - 400 K/uL   nRBC 0.0 0.0 - 0.2 %    Comment: Performed at [facility], [address], [place], [place] [address]  Glucose, capillary     Status: Abnormal   Collection Time: 05/01/23  7:51 AM  Result Value Ref Range   Glucose-Capillary 124 (H) 70 - 99 mg/dL    Comment: Glucose reference range applies only to samples taken after fasting for at least 8 hours.  Glucose, capillary     Status: Abnormal   Collection Time: 05/01/23 11:29 AM  Result Value Ref Range   Glucose-Capillary 148 (H) 70 - 99 mg/dL    Comment: Glucose reference range applies only to samples taken after fasting for at least 8 hours.  Glucose, capillary     Status: Abnormal   Collection Time: 05/01/23  4:05 PM  Result Value Ref Range   Glucose-Capillary 119 (H) 70 - 99 mg/dL    Comment: Glucose reference range applies only to samples taken after fasting for at least 8 hours.  Renal function panel (daily at 1600)     Status:  Abnormal   Collection Time: 05/01/23  4:23 PM  Result Value Ref Range   Sodium 133 (L) 135 - 145 mmol/L   Potassium 4.3 3.5 - 5.1 mmol/L   Chloride 99 98 - 111 mmol/L   CO2 20 (L) 22 - 32 mmol/L   Glucose, Bld 154 (H) 70 - 99 mg/dL    Comment: Glucose reference range applies only to samples taken after fasting for at least 8 hours.   BUN 44 (H) 8 - 23 mg/dL   Creatinine, Ser 3.87 (H) 0.61 - 1.24 mg/dL   Calcium 7.5 (L) 8.9 - 10.3 mg/dL   Phosphorus 3.0 2.5 - 4.6 mg/dL   Albumin 2.4 (  L) 3.5 - 5.0 g/dL   GFR, Estimated 9 (L) >60 mL/min    Comment: (NOTE) Calculated using the CKD-EPI Creatinine Equation (2021)    Anion gap 14 5 - 15    Comment: Performed at [facility], [address] [place], [place] [address]  Glucose, capillary     Status: Abnormal   Collection Time: 05/01/23  8:14 PM  Result Value Ref Range   Glucose-Capillary 134 (H) 70 - 99 mg/dL    Comment: Glucose reference range applies only to samples taken after fasting for at least 8 hours.  Glucose, capillary     Status: Abnormal   Collection Time: 05/01/23 11:25 PM  Result Value Ref Range   Glucose-Capillary 104 (H) 70 - 99 mg/dL    Comment: Glucose reference range applies only to samples taken after fasting for at least 8 hours.  Glucose, capillary     Status: Abnormal   Collection Time: 05/02/23  3:30 AM  Result Value Ref Range   Glucose-Capillary 107 (H) 70 - 99 mg/dL    Comment: Glucose reference range applies only to samples taken after fasting for at least 8 hours.  Renal function panel (daily at 0500)     Status: Abnormal   Collection Time: 05/02/23  4:23 AM  Result Value Ref Range   Sodium 133 (L) 135 - 145 mmol/L   Potassium 4.2 3.5 - 5.1 mmol/L   Chloride 99 98 - 111 mmol/L   CO2 23 22 - 32 mmol/L   Glucose, Bld 104 (H) 70 - 99 mg/dL    Comment: Glucose reference range applies only to samples taken after fasting for at least 8 hours.   BUN 50 (H) 8 - 23 mg/dL   Creatinine, Ser 6.04 (H) 0.61 -  1.24 mg/dL   Calcium 7.3 (L) 8.9 - 10.3 mg/dL   Phosphorus 3.7 2.5 - 4.6 mg/dL   Albumin 2.3 (L) 3.5 - 5.0 g/dL   GFR, Estimated 8 (L) >60 mL/min    Comment: (NOTE) Calculated using the CKD-EPI Creatinine Equation (2021)    Anion gap 11 5 - 15    Comment: Performed at [facility], [address], [place], [place] [address]  Magnesium     Status: None   Collection Time: 05/02/23  4:23 AM  Result Value Ref Range   Magnesium 2.3 1.7 - 2.4 mg/dL    Comment: Performed at [facility], [address] [place], [place] [address]  CBC     Status: Abnormal   Collection Time: 05/02/23  4:23 AM  Result Value Ref Range   WBC 7.3 4.0 - 10.5 K/uL   RBC 2.54 (L) 4.22 - 5.81 MIL/uL   Hemoglobin 7.7 (L) 13.0 - 17.0 g/dL   HCT 78.2 (L) 95.6 - 21.3 %   MCV 92.1 80.0 - 100.0 fL   MCH 30.3 26.0 - 34.0 pg   MCHC 32.9 30.0 - 36.0 g/dL   RDW 08.6 57.8 - 46.9 %   Platelets 127 (L) 150 - 400 K/uL   nRBC 0.0 0.0 - 0.2 %    Comment: Performed at [facility], [address], [place], [place] [address]  Glucose, capillary     Status: None   Collection Time: 05/02/23  7:15 AM  Result Value Ref Range   Glucose-Capillary 98 70 - 99 mg/dL    Comment: Glucose reference range applies only to samples taken after fasting for at least 8 hours.   CT  ABDOMEN PELVIS WO CONTRAST Result Date: 04/30/2023 CLINICAL DATA:  Hydronephrosis EXAM: CT ABDOMEN AND PELVIS WITHOUT CONTRAST TECHNIQUE: Multidetector CT imaging of the abdomen and pelvis was performed following the standard protocol without IV contrast. RADIATION DOSE REDUCTION: This exam was performed according to the departmental dose-optimization program which includes automated exposure control, adjustment of the mA and/or kV according to patient size and/or use of iterative reconstruction technique. COMPARISON:  Renal ultrasound 04/30/2023. PET-CT 12/05/2022. CT abdomen and pelvis 11/13/2021 FINDINGS: Lower chest: Patchy infiltrates in both lung  bases likely representing multifocal pneumonia. Trace right pleural effusion. Hepatobiliary: Cholelithiasis. No gallbladder wall thickening or stranding. No bile duct dilatation. No focal liver lesions. Pancreas: Unremarkable. No pancreatic ductal dilatation or surrounding inflammatory changes. Spleen: Normal in size without focal abnormality. Adrenals/Urinary Tract: No adrenal gland nodules. Prominent bilateral hydronephrosis and hydroureter increasing since the previous study. The bladder is decompressed with a Foley catheter but there appears to be residual bladder wall thickening and there is stranding in the fat around the bladder which is similar to the prior study. No discrete ureteral stones are demonstrated suggesting that hydronephrosis and ureterectasis probably results from reflux or stasis. Cystitis and pyelonephritis could be present. Correlate with urinalysis. Bilateral renal parenchymal atrophy, greater on the right. Hyperdense lesion on the right kidney measuring 2.6 cm diameter is unchanged since prior study, likely hemorrhagic cyst. No specific imaging follow-up is indicated. Stomach/Bowel: Stomach and small bowel are mostly decompressed. Stool-filled colon without abnormal distention. No wall thickening or inflammatory changes. Appendix is normal. Vascular/Lymphatic: Aortic atherosclerosis. No enlarged abdominal or pelvic lymph nodes. Reproductive: Prostate is unremarkable. Other: No free air or free fluid in the abdomen. Abdominal wall musculature appears intact. Musculoskeletal: Degenerative changes in the spine. No destructive bone lesions. IMPRESSION: 1. Bilateral hydronephrosis and hydroureter, new since prior study. No obstructing mass or stones demonstrated suggesting reflux or stasis. Possible pyelonephritis. Bilateral renal parenchymal atrophy greater on the right. 2. Bladder is decompressed with a Foley catheter but there appears to remain diffuse bladder wall thickening with  significant stranding in the fat around the bladder, possibly cystitis. 3. Cholelithiasis. 4. Diffuse aortic atherosclerosis. 5. Patchy infiltrates in the lung bases with small right pleural effusion likely representing multifocal pneumonia. Electronically Signed   By: Burman Nieves M.D.   On: 04/30/2023 18:25   ECHOCARDIOGRAM COMPLETE Result Date: 04/30/2023    ECHOCARDIOGRAM REPORT   Patient Name:   Louis Butler Date of Exam: 04/30/2023 Medical Rec #:  [id number]      Height:       75.0 in Accession #:    [id number]     Weight:       171.5 lb Date of Birth:  [birth year]      BSA:          2.056 m Patient Age:    67 years       BP:           114/66 mmHg Patient Gender: M              HR:           83 bpm. Exam Location:  Inpatient Procedure: 2D Echo, Color Doppler and Cardiac Doppler Indications:    Pulmonary HTN  History:        Patient has no prior history of Echocardiogram examinations.                 Risk Factors:Diabetes. ESRD.  Sonographer:    Webb Laws Referring Phys:  [id number] OMAR M ALBUSTAMI IMPRESSIONS  1. Left ventricular ejection fraction, by estimation, is 65 to 70%. The left ventricle has normal function. The left ventricle has no regional wall motion abnormalities. There is moderate concentric left ventricular hypertrophy. Indeterminate diastolic filling due to E-A fusion.  2. Right ventricular systolic function is normal. The right ventricular size is normal. Tricuspid regurgitation signal is inadequate for assessing PA pressure.  3. The mitral valve is grossly normal. Trivial mitral valve regurgitation. No evidence of mitral stenosis.  4. The aortic valve is tricuspid. Aortic valve regurgitation is not visualized. No aortic stenosis is present.  5. There is mild dilatation of the ascending aorta, measuring 40 mm.  6. The inferior vena cava is normal in size with greater than 50% respiratory variability, suggesting right atrial pressure of 3 mmHg. FINDINGS  Left Ventricle: Left  ventricular ejection fraction, by estimation, is 65 to 70%. The left ventricle has normal function. The left ventricle has no regional wall motion abnormalities. The left ventricular internal cavity size was normal in size. There is  moderate concentric left ventricular hypertrophy. Indeterminate diastolic filling due to E-A fusion. Right Ventricle: The right ventricular size is normal. No increase in right ventricular wall thickness. Right ventricular systolic function is normal. Tricuspid regurgitation signal is inadequate for assessing PA pressure. Left Atrium: Left atrial size was normal in size. Right Atrium: Right atrial size was normal in size. Pericardium: There is no evidence of pericardial effusion. Mitral Valve: The mitral valve is grossly normal. Trivial mitral valve regurgitation. No evidence of mitral valve stenosis. Tricuspid Valve: The tricuspid valve is grossly normal. Tricuspid valve regurgitation is trivial. No evidence of tricuspid stenosis. Aortic Valve: The aortic valve is tricuspid. Aortic valve regurgitation is not visualized. No aortic stenosis is present. Pulmonic Valve: The pulmonic valve was grossly normal. Pulmonic valve regurgitation is trivial. No evidence of pulmonic stenosis. Aorta: The aortic root is normal in size and structure. There is mild dilatation of the ascending aorta, measuring 40 mm. Venous: The inferior vena cava is normal in size with greater than 50% respiratory variability, suggesting right atrial pressure of 3 mmHg. IAS/Shunts: The atrial septum is grossly normal.  LEFT VENTRICLE PLAX 2D LVIDd:         5.30 cm   Diastology LVIDs:         2.50 cm   LV e' medial:    5.78 cm/s LV PW:         1.30 cm   LV E/e' medial:  8.3 LV IVS:        1.40 cm   LV e' lateral:   13.80 cm/s LVOT diam:     2.30 cm   LV E/e' lateral: 3.5 LV SV:         89 LV SV Index:   43 LVOT Area:     4.15 cm  RIGHT VENTRICLE             IVC RV Basal diam:  3.60 cm     IVC diam: 1.80 cm RV S prime:      22.40 cm/s TAPSE (M-mode): 1.8 cm LEFT ATRIUM             Index        RIGHT ATRIUM           Index LA diam:        4.30 cm 2.09 cm/m   RA Area:     13.40 cm LA Vol (A2C):   23.5 ml  11.43 ml/m  RA Volume:   30.70 ml  14.93 ml/m LA Vol (A4C):   34.6 ml 16.83 ml/m LA Biplane Vol: 29.6 ml 14.40 ml/m  AORTIC VALVE LVOT Vmax:   109.00 cm/s LVOT Vmean:  71.700 cm/s LVOT VTI:    0.214 m  AORTA Ao Root diam: 3.40 cm Ao Asc diam:  4.00 cm MITRAL VALVE MV Area (PHT): 3.21 cm    SHUNTS MV Decel Time: 236 msec    Systemic VTI:  0.21 m MV E velocity: 47.90 cm/s  Systemic Diam: 2.30 cm MV A velocity: 72.00 cm/s MV E/A ratio:  0.67 Lennie Odor MD Electronically signed by Lennie Odor MD Signature Date/Time: 04/30/2023/4:43:03 PM    Final    US RENAL Result Date: 04/30/2023 CLINICAL DATA:  Acute kidney injury EXAM: RENAL / URINARY TRACT ULTRASOUND COMPLETE COMPARISON:  PET CT 12/05/2022 ultrasound 04/08/2022 FINDINGS: Right Kidney: Renal measurements: 13 x 6.8 x 5.2 cm = volume: 485.4 mL. Cortex is echogenic. Diffuse cortical thinning consistent with atrophy. Severe hydronephrosis and hydroureter partially visualized. Cyst upper pole right kidney measuring 2.7 cm, no imaging follow-up is recommended. Left Kidney: Renal measurements: 12.4 x 5 x 5.1 cm = volume: 349.5 mL. Cortex is echogenic. Renal cortical thinning consistent with atrophy. Severe left-sided hydronephrosis and partially visualized hydroureter. Small cyst measuring 7 mm for which no imaging follow-up is recommended. Bladder: Avascular area within the dependent bladder measuring 5.6 by 4.8 x 1.3 cm, with straight edge suspected to represent fluid level Other: None. IMPRESSION: 1. Severe bilateral hydronephrosis and hydroureter which is new compared to prior 2. Echogenic kidneys with cortical thinning consistent with chronic kidney disease and atrophy 3. Suspect layering echogenic avascular debris or hemorrhagic material within the dependent bladder.  Electronically Signed   By: Jasmine Pang M.D.   On: 04/30/2023 15:09   DG CHEST PORT 1 VIEW Result Date: 04/30/2023 CLINICAL DATA:  68 year old male status post dialysis catheter placement. Metastatic head and neck cancer. EXAM: PORTABLE CHEST 1 VIEW COMPARISON:  Portable chest 06/01/2020.  PET-CT 07/25/2022. FINDINGS: Portable AP semi upright view at 0947 hours. Right IJ approach vascular catheter placed, tip is at the level of the carina, SVC. Mediastinal contours are stable and within normal limits. Lower lung volumes. No pneumothorax. Allowing for portable technique the lungs are clear. Visualized tracheal air column is within normal limits. Trace oral contrast in the gastric fundus. Paucity of bowel gas. No acute osseous abnormality identified. IMPRESSION: Right IJ approach vascular catheter placed, tip at the level of the SVC. No pneumothorax or acute cardiopulmonary abnormality. Electronically Signed   By: Odessa Fleming M.D.   On: 04/30/2023 10:03    PMH:   Past Medical History:  Diagnosis Date   Arthritis    Cellulitis 03/14/2019   feet   CKD (chronic kidney disease)    Diabetes mellitus without complication (HCC)    Type II   End-stage kidney disease (HCC) 03/14/2019   was on hemodialysis ~ June 2021 - March 2022 when he discontinued; followed by Dr. Zetta Bills   GERD (gastroesophageal reflux disease)    Heart murmur    History of blood transfusion    Hypertension    12/30/19- having low blood pressure since beginning   Irregular heart beat    UTI (urinary tract infection)     PSH:   Past Surgical History:  Procedure Laterality Date   arm surgery Right    was cut to the bone   BASCILIC VEIN TRANSPOSITION Left  11/18/2019   Procedure: FIRST STAGE LEFT ARM BASCILIC VEIN TRANSPOSITION;  Surgeon: Nada Libman, MD;  Location: [facility] OR;  Service: Vascular;  Laterality: Left;   BASCILIC VEIN TRANSPOSITION Left 01/04/2020   Procedure: LEFT SECOND STAGE BASCILIC VEIN TRANSPOSITION;  Surgeon:  Nada Libman, MD;  Location: [facility] OR;  Service: Vascular;  Laterality: Left;   COLONOSCOPY  06/2019   DIRECT LARYNGOSCOPY Bilateral 08/06/2022   Procedure: DIRECT LARYNGOSCOPY WITH BIOPSY;  Surgeon: Christia Reading, MD;  Location: [facility] OR;  Service: ENT;  Laterality: Bilateral;   EYE SURGERY Bilateral    cataract   NASOPHARYNGEAL BIOPSY Left 08/06/2022   Procedure: NASOPHARYNGEAL BIOPSY;  Surgeon: Christia Reading, MD;  Location: [facility] OR;  Service: ENT;  Laterality: Left;   TONSILLECTOMY Left 08/06/2022   Procedure: TONSILLECTOMY;  Surgeon: Christia Reading, MD;  Location: [facility] OR;  Service: ENT;  Laterality: Left;   TRANSURETHRAL RESECTION OF PROSTATE N/A 08/08/2019   Procedure: TRANSURETHRAL RESECTION OF THE PROSTATE (TURP);  Surgeon: Crista Elliot, MD;  Location: [facility] ORS;  Service: Urology;  Laterality: N/A;   UPPER GASTROINTESTINAL ENDOSCOPY  06/2019   WISDOM TOOTH EXTRACTION      Allergies:  Allergies  Allergen Reactions   Hydrocodone Hives and Itching   Percocet [Oxycodone-Acetaminophen] Hives and Itching    Medications:   Prior to Admission medications   Medication Sig Start Date End Date Taking? Authorizing Provider  acetaminophen (TYLENOL) 500 MG tablet Take 1,000 mg by mouth every 6 (six) hours as needed for headache (pain).    Yes [provider]  amLODipine (NORVASC) 5 MG tablet Take 1 tablet (5 mg total) by mouth at bedtime. 10/22/22  Yes Latrelle Dodrill, MD  carvedilol (COREG) 25 MG tablet Take 25 mg by mouth 2 (two) times daily with a meal.   Yes [provider]  Cholecalciferol (VITAMIN D3) 125 MCG (5000 UT) CAPS Take 5,000 Units by mouth daily.   Yes [provider]  esomeprazole (NEXIUM) 40 MG capsule TAKE 1 CAPSULE BY MOUTH ONCE DAILY AT  NOON Patient taking differently: Take 40 mg by mouth daily as needed (for heartburn). 08/23/21  Yes Mansouraty, Netty Starring., MD  gabapentin (NEURONTIN) 300 MG capsule Take 1 capsule (300 mg total) by mouth at bedtime.  01/12/23  Yes Latrelle Dodrill, MD  LANTUS SOLOSTAR 100 UNIT/ML Solostar Pen Inject 6 units subcutaneously once daily 01/21/23  Yes Latrelle Dodrill, MD  loperamide (IMODIUM A-D) 2 MG tablet Take 1 tablet (2 mg total) by mouth 4 (four) times daily as needed for diarrhea or loose stools. 06/12/20  Yes Latrelle Dodrill, MD  mupirocin ointment (BACTROBAN) 2 % Apply 1 Application topically 2 (two) times daily. Left ear canal   Yes [provider]  sodium bicarbonate 650 MG tablet Take 650 mg by mouth 2 (two) times daily.   Yes [provider]  tamsulosin (FLOMAX) 0.4 MG CAPS capsule Take 0.4 mg by mouth daily.   Yes [provider]  traMADol (ULTRAM) 50 MG tablet Take 1 tablet (50 mg total) by mouth every 6 (six) hours as needed. Patient taking differently: Take 50 mg by mouth every 6 (six) hours as needed for moderate pain (pain score 4-6). 02/26/22  Yes Latrelle Dodrill, MD  trimethoprim (TRIMPEX) 100 MG tablet Take 100 mg by mouth daily. 01/08/22  Yes [provider]  VELTASSA 8.4 g packet Take 1 packet by mouth daily.   Yes [provider]  blood glucose meter  kit and supplies KIT Dispense based on patient and insurance preference. Use up to four times daily as directed. (FOR ICD-9 250.00, 250.01). 03/28/19   Elease Etienne, MD  Insulin Syringes, Disposable, U-100 0.5 ML MISC Use as per instructions 3 times daily AC. 03/28/19   Hongalgi, Maximino Greenland, MD    Discontinued Meds:   Medications Discontinued During This Encounter  Medication Reason   sodium chloride 0.9 % bolus 500 mL    hydrocortisone 2.5 % cream Patient Preference   famotidine (PEPCID) 20 MG tablet Patient Preference   sodium zirconium cyclosilicate (LOKELMA) packet 10 g    0.9 %  sodium chloride infusion    insulin aspart (novoLOG) injection 0-6 Units    sodium zirconium cyclosilicate (LOKELMA) packet 10 g    patiromer Lelon Perla) packet 1 packet     prismasol BGK 4/2.5  infusion     prismasol BGK 4/2.5 infusion    prismasol BGK 4/2.5 infusion    midodrine (PROAMATINE) tablet 10 mg    norepinephrine (LEVOPHED) 4mg  in (0.016 mg/mL) premix infusion    midodrine (PROAMATINE) tablet 5 mg     Social History:  reports that he quit smoking about 6 months ago. His smoking use included cigarettes. He started smoking about 54 years ago. He has a 25.6 pack-year smoking history. He has never used smokeless tobacco. He reports that he does not currently use alcohol. He reports that he does not use drugs.  Family History:   Family History  Problem Relation Age of Onset   Diabetes type II Mother    Lung cancer Mother    Hypertension Mother    Diabetes type II Father    Diabetes type II Sister    Diabetes type II Brother    Hypertension Brother    Other Son        burned   CAD Neg Hx    Colon cancer Neg Hx    Esophageal cancer Neg Hx    Inflammatory bowel disease Neg Hx    Liver disease Neg Hx    Pancreatic cancer Neg Hx    Rectal cancer Neg Hx    Stomach cancer Neg Hx     Blood pressure (!) 119/54, pulse (!) 58, temperature 98.5 F (36.9 C), temperature source Axillary, resp. rate 12, height 6\' 3"  (1.905 m), weight 76.6 kg, SpO2 94%. Physical Exam: Gen: supin in bed moaning but arousable ENT: L ear/scalp post surgical changes noted Neck: supple, flat JVD CV:  RRR, no rub appreciated Abd:  soft, nontender, NABS Lungs: normal WOB Extr: no edema, thin Access:  L BC AVF +t/b with moderate aneurysm mid cannulation zone, RIJ Temp Neuro: AOx3 now and pleasant, conversing appropriately Skin: excoriations noted covering forearms from pruritus     Ethelene Hal, MD 05/02/2023, 8:55 AM

## 2023-05-03 DIAGNOSIS — D649 Anemia, unspecified: Secondary | ICD-10-CM | POA: Diagnosis present

## 2023-05-03 DIAGNOSIS — N390 Urinary tract infection, site not specified: Secondary | ICD-10-CM

## 2023-05-03 DIAGNOSIS — L98429 Non-pressure chronic ulcer of back with unspecified severity: Secondary | ICD-10-CM | POA: Diagnosis present

## 2023-05-03 DIAGNOSIS — N189 Chronic kidney disease, unspecified: Secondary | ICD-10-CM | POA: Diagnosis not present

## 2023-05-03 DIAGNOSIS — E1122 Type 2 diabetes mellitus with diabetic chronic kidney disease: Secondary | ICD-10-CM | POA: Insufficient documentation

## 2023-05-03 DIAGNOSIS — N179 Acute kidney failure, unspecified: Secondary | ICD-10-CM | POA: Diagnosis not present

## 2023-05-03 HISTORY — DX: Urinary tract infection, site not specified: N39.0

## 2023-05-03 LAB — RENAL FUNCTION PANEL
Albumin: 2.3 g/dL — ABNORMAL LOW (ref 3.5–5.0)
Anion gap: 12 (ref 5–15)
BUN: 35 mg/dL — ABNORMAL HIGH (ref 8–23)
CO2: 24 mmol/L (ref 22–32)
Calcium: 7.6 mg/dL — ABNORMAL LOW (ref 8.9–10.3)
Chloride: 98 mmol/L (ref 98–111)
Creatinine, Ser: 5.09 mg/dL — ABNORMAL HIGH (ref 0.61–1.24)
GFR, Estimated: 12 mL/min — ABNORMAL LOW (ref >60)
Glucose, Bld: 103 mg/dL — ABNORMAL HIGH (ref 70–99)
Phosphorus: 2.1 mg/dL — ABNORMAL LOW (ref 2.5–4.6)
Potassium: 3.4 mmol/L — ABNORMAL LOW (ref 3.5–5.1)
Sodium: 134 mmol/L — ABNORMAL LOW (ref 135–145)

## 2023-05-03 LAB — CBC
HCT: 23.3 % — ABNORMAL LOW (ref 39.0–52.0)
Hemoglobin: 7.8 g/dL — ABNORMAL LOW (ref 13.0–17.0)
MCH: 30.6 pg (ref 26.0–34.0)
MCHC: 33.5 g/dL (ref 30.0–36.0)
MCV: 91.4 fL (ref 80.0–100.0)
Platelets: 137 10*3/uL — ABNORMAL LOW (ref 150–400)
RBC: 2.55 MIL/uL — ABNORMAL LOW (ref 4.22–5.81)
RDW: 13 % (ref 11.5–15.5)
WBC: 7.8 10*3/uL (ref 4.0–10.5)
nRBC: 0 % (ref 0.0–0.2)

## 2023-05-03 LAB — GLUCOSE, CAPILLARY
Glucose-Capillary: 118 mg/dL — ABNORMAL HIGH (ref 70–99)
Glucose-Capillary: 144 mg/dL — ABNORMAL HIGH (ref 70–99)
Glucose-Capillary: 225 mg/dL — ABNORMAL HIGH (ref 70–99)
Glucose-Capillary: 143 mg/dL — ABNORMAL HIGH (ref 70–99)
Glucose-Capillary: 129 mg/dL — ABNORMAL HIGH (ref 70–99)

## 2023-05-03 LAB — URINE CULTURE

## 2023-05-03 LAB — MAGNESIUM: Magnesium: 2 mg/dL (ref 1.7–2.4)

## 2023-05-03 MED ORDER — MIDODRINE HCL 5 MG PO TABS
10.0000 mg | ORAL_TABLET | ORAL | Status: DC | PRN
  Administered 2023-05-08 – 2023-05-18 (×3): 10 mg via ORAL
  Filled 2023-05-03 (×4): qty 2

## 2023-05-03 MED ORDER — SERTRALINE HCL 20 MG/ML PO CONC: 25.0000 mg | Freq: Every day | ORAL | Status: DC

## 2023-05-03 MED ORDER — SERTRALINE HCL 50 MG PO TABS
25.0000 mg | ORAL_TABLET | Freq: Every day | ORAL | Status: DC
  Administered 2023-05-03 – 2023-05-19 (×17): 25 mg via ORAL
  Filled 2023-05-03 (×18): qty 1

## 2023-05-03 MED ORDER — POTASSIUM & SODIUM PHOSPHATES 280-160-250 MG PO PACK
2.0000 | PACK | Freq: Once | ORAL | Status: AC
  Administered 2023-05-03: 2 via ORAL
  Filled 2023-05-03: qty 2

## 2023-05-03 MED ORDER — NOREPINEPHRINE 4 MG/250ML-% IV SOLN
0.0000 ug/min | INTRAVENOUS | Status: DC
  Administered 2023-05-03: 2 ug/min via INTRAVENOUS

## 2023-05-03 NOTE — Progress Notes (Signed)
eLink Physician-Brief Progress Note Patient Name: JACCOB CZAPLICKI DOB: 1956/02/02 MRN: [id number]   Date of Service  05/03/2023  HPI/Events of Note  Hypotension.  Patient with blood pressure of 117/40.  Mean arterial pressure is less than 65.  He is to have hemodialysis tonight.  Bedside RN requesting norepinephrine as backup in case patient gets hypotensive during dialysis.  eICU Interventions  Norepinephrine ordered, however, I have discussed with RN not to start norepinephrine if patient is not symptomatic from hypotension or if his systolic blood pressure remains above 100.       Intervention Category Major Interventions: Hypotension - evaluation and management  Carilyn Goodpasture 05/03/2023, 12:09 AM

## 2023-05-03 NOTE — Assessment & Plan Note (Addendum)
Hgb stable 7.7 >7.8. Normocytic. Suspect anemia of chronic disease. Iron studies including ferritin of 648, makes iron deficiency less likely. C/f iron overload- may be related to infection, inflammatory state or malignancy. Liver function studies WNL, lower concern for hereditary hemochromatosis. -Continue to monitor -Recommend OP follow-up

## 2023-05-03 NOTE — Hospital Course (Addendum)
 BLAYDEN CONWELL is a 68 y.o. 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  Acute on Chronic Renal Failure  Electrolyte disturbance Patient was admitted to ICU and started on CRRT for renal failure, had great improvement of electrolyte disturbances when this was initiated.  Tolerated transition to intermittent HD well in hospital.  LUE AVF creation performed on 1/28.  CLIP process initiated and was continued on MWF schedule at [facility].  At time of discharge he tolerated HD well.   Hypotension Presented with hypotension requiring pressor support, was initially admitted to the ICU.  Pressures improved with CRRT, and he was off pressors by 1/26 when he transitioned to the floor. Intermittently hypotensive after starting HD.  Was continued on midodrine 10 mg TID to improve pressures.  He continued to have hypotension which hindered his discharge to appropriate facility, so he was started on midodrine 10 mg 3 times daily, with 10 mg as needed prior to HD.  He continued to have profound orthostasis.  ACTH stim test was equivocal. Per Nephro, proceeded with fludrocortisone treatment, which produced mild improvement in patient's symptoms.  After discussion with the patient and his wife, he was discharged to [facility] upon bed becoming available approximately 2 weeks later.  Final regimen included midodrine 10 mg 3 times daily and 0.2 mg of fludrocortisone once daily.  Altered Mental Status Patient was encephalopathic on admission, suspected to be secondary to uremia and hyperkalemia, which resolved with initiation of dialysis.  At time of discharge patient was alert and oriented x4 and at baseline.  Bilateral Hydronephrosis obstructive uropathy  Pseudomonal UTI S/p Zosyn (1/24-1/27) with transition to Cefepime (1/27-1/30) in the  hospital.  Per Urology recommendations, will keep Foley in place until outpatient follow-up.  Patient initially thought to require Foley exchange this hospitalization, but will keep Foley in place.  Anemia Hemoglobin remained around 7-8 while in the hospital.  Thought to be related to anemia of chronic disease.  Was given ESA. 1u pRBC given for hgb 6.7 on 1/29 likely from blood draws.  Other chronic conditions were medically managed with home medications and formulary alternatives as necessary (GERD).  Follow-up recommendations Patient will need urology follow up when discharged regarding Foley changes. Blood pressure medication held due to soft pressures.  Additionally, midodrine and fludrocortisone started for hypotension. Consider discontinuation of these and addition of blood pressure medication if pressures are elevated. Please ensure follow-up with [facility]. Stage II sacral ulcer, monitor and refer to wound care clinic if necessary. Please monitor CBGs outpatient and adjust insulin regimen as needed.

## 2023-05-03 NOTE — Assessment & Plan Note (Signed)
Encephalopathy and electrolyte abnormality resolved with dialysis.  Plan for CRRT, will resume as outpatient in [place].  Patient hypotensive, particularly with dialysis-on midodrine. -Nephrology consulted, appreciate recommendations -Continue CRRT -Continue midodrine

## 2023-05-03 NOTE — Assessment & Plan Note (Signed)
Paitent has c/f L ear pain. S/p L neck dssection, L temporal bone resection and L ear canal dissection/flap in 10/2022. CT temporal bones showing post-operative changes without new masses or acute findings to explain L ear pain.  -Follow-up with [facility] O/P

## 2023-05-03 NOTE — Assessment & Plan Note (Signed)
Pseudomonal UTI, with evidence of bialteral hydronephrosis c/f outlet obstruction. Urology has seen, recommend keep foley in place until OP f/u, and 14 day antibiotic course, OP cystoscopy. -Pending susceptibilities  -Zosyn (1/24- ) -Keep foley in place

## 2023-05-03 NOTE — Assessment & Plan Note (Signed)
-  WOC, frequent turns -RD recs

## 2023-05-03 NOTE — Progress Notes (Signed)
Daily Progress Note Intern Pager: [phone]  Patient name: Louis Butler Medical record number: [id number] Date of birth: 1955-06-01 Age: 68 y.o. 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  He was noted to be uremic and hyperkalemic with a potassium of 7.5.  He was admitted to the ICU for pressors and started on CRRT.  Additionally, found to have pseudomonal UTI.  Patient's encephalopathy and electrolytes improved with RRT, he is currently undergoing CLIP process and will continue dialysis as an outpatient in [place].  Assessment & Plan ESRD (end stage renal disease) (HCC) Encephalopathy and electrolyte abnormality resolved with dialysis.  Plan for CRRT, will resume as outpatient in [place].  Patient hypotensive, particularly with dialysis-on midodrine. -Nephrology consulted, appreciate recommendations -Continue CRRT -Continue midodrine Urinary tract infection  Bilateral Hydronephrosis Pseudomonal UTI, with evidence of bialteral hydronephrosis c/f outlet obstruction. Urology has seen, recommend keep foley in place until OP f/u, and 14 day antibiotic course, OP cystoscopy. 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 S/p L neck dssection, L temporal bone resection and L ear canal dissection/flap in 10/2022. CT temporal bones showing post-operative changes  without new masses or acute findings to explain L ear pain.  -Follow-up with [facility] Anemia Hgb stable 7.7 >7.8. Normocytic. Suspect anemia of chronic disease. Iron studies including ferritin of 648, makes iron deficiency less likely. C/f iron overload- may be related to infection, inflammatory state or malignancy. Liver function studies WNL, lower concern for hereditary hemochromatosis. -Continue to monitor -Recommend OP follow-up Type 2 diabetes mellitus with diabetic chronic kidney disease (HCC) A1C of 6.8. On 6U lantus at home. Well controlled, has only needed 3 U short acting during admission.  -CBGs -SSI  Chronic and Stable Problems:  HTN- hold BP medications 2/2 soft pressures, resume as tolerated GERD-Protonix 40 mg daily   FEN/GI: Regular PPx: Heparin Dispo:Home pending clinical improvement  and discontinuation of IV medications.  Subjective:  Patient reports feeling better. No acute concerns.  Objective: Temp:  [97.4 F (36.3 C)-98.1 F (36.7 C)] 97.9 F (36.6 C) (01/26 0717) Pulse Rate:  [55-214] 75 (01/26 0645) Resp:  [10-21] 13 (01/26 0645) BP: (78-143)/(45-83) 120/55 (01/26 0645) SpO2:  [90 %-100 %] 99 % (01/26 0645) Weight:  [78.6 kg-79.5 kg] 78.6 kg (01/26 0435) Physical Exam: General: NAD, chronically ill-appearing Cardiovascular: RRR, no murmurs Respiratory: CTAB, normal wob on RA Abdomen: Soft, not tender, not distended Extremities: Moving all 4 extremities, SCDs in place.  Laboratory: Most recent CBC Lab Results  Component Value Date   WBC 7.8 05/03/2023   HGB 7.8 (L) 05/03/2023   HCT 23.3 (L) 05/03/2023   MCV 91.4 05/03/2023   PLT 137 (L) 05/03/2023   Most recent BMP    Latest Ref Rng & Units 05/03/2023    2:29 AM  BMP  Glucose 70 - 99 mg/dL 130   BUN 8 -  23 mg/dL 35   Creatinine 1.61 - 1.24 mg/dL 0.96   Sodium 045 - 409 mmol/L 134   Potassium 3.5 - 5.1 mmol/L 3.4   Chloride 98 - 111 mmol/L 98   CO2 22 - 32 mmol/L 24   Calcium  8.9 - 10.3 mg/dL 7.6     Louis Kocher, DO 05/03/2023, 7:23 AM  PGY-2, [facility] FPTS Intern pager: [phone], text pages welcome Secure chat group [facility] Teaching Service

## 2023-05-03 NOTE — Assessment & Plan Note (Signed)
A1C of 6.8. On 6U lantus at home. Well controlled, has only needed 3 U short acting during admission.  -CBGs -SSI

## 2023-05-03 NOTE — Progress Notes (Signed)
Louis Butler is an 68 y.o. male with CKD 5 (f/b Dr. Ricard Dillon), DM, HTN, recent squamous cell ca H&N (surgery, XRT 2024), anemia who presented with N/V/abd pain and was found to have marked hyperkalemia and renal failure for which nephrology is consulted.  Was on HD 2021-06/2020 when he self discontinued and has followed in clinic since.  Last OV with Dr. Allena Katz was 02/19/24 - GFR11, Hb 9.6.  Pt unclear if he would be accepting of long term dialysis.    Assessment/Plan: 1) CKD 5 with AKI vs progression to ESRD:  Longstanding advanced CKD, was on dialysis 2022 but stopped and has followed with Dr. Allena Katz since; most recent GFR 02/2023 11. By history seems patient may have had progression of CKD which led to his acute presentation though there is some concern for acute GI illness potentiating AKI.  Either way emergent dialysis is indicated now to which he is agreeable.    - Tolerated iHD evening of 1/22 but only 2 hours @ low efficiency given very high risk for dialysis states disequilibrium; appreciate CCV placing temp cath -> CRRT -> iHD #2 finishing Sun AM. BP dropped and will give midodrine before hd next treatment. Next HD treatment Tuesday AM (just finished this AM) -> TTS.  CLIP process started; he is ESRD. He only wants to go to [facility] for Dr. Allena Katz to be his nephrologist.   2) Hyperkalemia: CRRT for 24hrs; better now   3) Hyponatremia: corrected with RRT   4) HTN: on coreg baseline, hold in setting of marginal BP   5) AGMA: in setting of severe AKI, defer VBG to PCCM;  corrected now   6) Anemia: Hb 9.5, add iron indices.  Would clarify his H&N ca status prior to ESA administration.     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 Has rec'd IV ca supplement.   8) Obstructive uropathy w/ b/l hydro now with foley   Subjective: Denies fever chills sob. BP dropped with dialysis which finished this AM. Oriented to place and time. Updated spouse who  was bedside.   Chemistry and CBC: Creatinine, Ser  Date/Time Value Ref Range Status  05/03/2023 02:29 AM 5.09 (H) 0.61 - 1.24 mg/dL Final  69/62/9528 41:32 AM 7.33 (H) 0.61 - 1.24 mg/dL Final  44/04/270 53:66 PM 6.28 (H) 0.61 - 1.24 mg/dL Final  44/06/4740 59:56 AM 7.13 (H) 0.61 - 1.24 mg/dL Final  38/75/6433 29:51 PM 10.02 (H) 0.61 - 1.24 mg/dL Final  88/41/6606 30:16 PM 12.45 (H) 0.61 - 1.24 mg/dL Final    Comment:    DIALYSIS RESULTS CHECKED   04/30/2023 04:12 AM 26.68 (H) 0.61 - 1.24 mg/dL Final    Comment:    RESULT CONFIRMED BY MANUAL DILUTION  04/30/2023 12:00 AM 26.60 (H) 0.61 - 1.24 mg/dL Corrected    Comment:    RESULT CONFIRMED BY MANUAL DILUTION CORRECTED RESULTS CALLED TO: M.MEJIA 0046 04/30/2023 BY G.GANADEN CORRECTED ON 01/23 AT 0046: PREVIOUSLY REPORTED AS 26.10 RESULT CONFIRMED BY MANUAL DILUTION   04/29/2023 05:44 PM 30.39 (H) 0.61 - 1.24 mg/dL Final    Comment:    RESULTS CONFIRMED BY MANUAL DILUTION  08/06/2022 09:07 AM 5.58 (H) 0.61 - 1.24 mg/dL Final  04/15/3233 57:32 PM 5.03 (H) 0.76 - 1.27 mg/dL Final  20/25/4270 62:37 PM 5.07 (H) 0.76 - 1.27 mg/dL Final  62/83/1517 61:60 PM 5.09 (H) 0.76 - 1.27 mg/dL Final  73/71/0626 94:85 AM 4.22 (H) 0.76 -  1.27 mg/dL Final  16/01/9603 54:09 PM 4.57 (H) 0.76 - 1.27 mg/dL Final  81/19/1478 29:56 PM 3.42 (H) 0.61 - 1.24 mg/dL Final  21/30/8657 84:69 AM 3.60 (H) 0.61 - 1.24 mg/dL Final  62/95/2841 32:44 AM 3.20 (H) 0.61 - 1.24 mg/dL Final  04/09/7251 66:44 AM 4.47 (H) 0.61 - 1.24 mg/dL Final  03/47/4259 56:38 PM 4.62 (H) 0.61 - 1.24 mg/dL Final  75/64/3329 51:88 PM 5.15 (H) 0.61 - 1.24 mg/dL Final  41/66/0630 16:01 PM 4.41 (H) 0.76 - 1.27 mg/dL Final  09/32/3557 32:20 AM 4.34 (H) 0.61 - 1.24 mg/dL Final  25/42/7062 37:62 AM 4.27 (H) 0.61 - 1.24 mg/dL Final  83/15/1761 60:73 AM 4.20 (H) 0.61 - 1.24 mg/dL Final  71/09/2692 85:46 AM 3.96 (H) 0.61 - 1.24 mg/dL Final  27/06/5007 38:18 PM 3.94 (H) 0.61 - 1.24 mg/dL  Final  29/93/7169 67:89 AM 4.70 (H) 0.61 - 1.24 mg/dL Final  38/01/1750 02:58 AM 4.75 (H) 0.61 - 1.24 mg/dL Final  52/77/8242 35:36 AM 5.06 (H) 0.61 - 1.24 mg/dL Final  14/43/1540 08:67 AM 5.23 (H) 0.61 - 1.24 mg/dL Final  61/95/0932 67:12 AM 5.70 (H) 0.61 - 1.24 mg/dL Final  45/80/9983 38:25 AM 5.75 (H) 0.61 - 1.24 mg/dL Final  05/39/7673 41:93 AM 5.67 (H) 0.61 - 1.24 mg/dL Final  79/05/4095 35:32 AM 5.37 (H) 0.61 - 1.24 mg/dL Final  99/24/2683 41:96 AM 4.88 (H) 0.61 - 1.24 mg/dL Final  22/29/7989 21:19 AM 4.54 (H) 0.61 - 1.24 mg/dL Final  41/74/0814 48:18 AM 4.71 (H) 0.61 - 1.24 mg/dL Final  56/31/4970 26:37 AM 4.55 (H) 0.61 - 1.24 mg/dL Final  85/88/5027 74:12 AM 4.29 (H) 0.61 - 1.24 mg/dL Final  87/86/7672 09:47 AM 4.80 (H) 0.61 - 1.24 mg/dL Final  09/62/8366 29:47 PM 4.80 (H) 0.61 - 1.24 mg/dL Final  65/46/5035 46:56 AM 4.93 (H) 0.61 - 1.24 mg/dL Final  81/27/5170 01:74 PM 5.24 (H) 0.61 - 1.24 mg/dL Final  94/49/6759 16:38 AM 1.20 0.61 - 1.24 mg/dL Final  46/65/9935 70:17 PM 0.80 0.61 - 1.24 mg/dL Final  79/39/0300 92:33 AM 1.02 0.61 - 1.24 mg/dL Final  00/76/2263 33:54 AM 0.90 0.61 - 1.24 mg/dL Final  56/25/6389 37:34 AM 0.87 0.61 - 1.24 mg/dL Final  28/76/8115 72:62 AM 0.89 0.61 - 1.24 mg/dL Final  03/55/9741 63:84 PM 0.86 0.61 - 1.24 mg/dL Final  53/64/6803 21:22 AM 1.01 0.61 - 1.24 mg/dL Final   Recent Labs  Lab 04/30/23 0412 04/30/23 0858 04/30/23 1622 04/30/23 2224 05/01/23 0443 05/01/23 1623 05/02/23 0423 05/03/23 0229  NA 124*  --  133* 132* 134* 133* 133* 134*  K 5.4* 3.9 4.5 4.7 4.6 4.3 4.2 3.4*  CL 92*  --  97* 99 101 99 99 98  CO2 13*  --  20* 18* 21* 20* 23 24  GLUCOSE 144*  --  122* 164* 144* 154* 104* 103*  BUN 258*  --  109* 83* 58* 44* 50* 35*  CREATININE 26.68*  --  12.45* 10.02* 7.13* 6.28* 7.33* 5.09*  CALCIUM 6.4*  --  7.7* 7.4* 7.5* 7.5* 7.3* 7.6*  PHOS >30.0*  --  5.5* 4.4 3.5 3.0 3.7 2.1*   Recent Labs  Lab 04/30/23 0410 05/01/23 0443  05/02/23 0423 05/03/23 0229  WBC 6.5 8.3 7.3 7.8  HGB 8.2* 8.3* 7.7* 7.8*  HCT 23.7* 24.6* 23.4* 23.3*  MCV 87.5 88.2 92.1 91.4  PLT 133* 140* 127* 137*   Liver Function Tests: Recent Labs  Lab 04/30/23 2052 04/30/23 2224  05/01/23 1623 05/02/23 0423 05/03/23 0229  AST 15  --   --   --   --   ALT 11  --   --   --   --   ALKPHOS 47  --   --   --   --   BILITOT 1.2  --   --   --   --   PROT 7.0  --   --   --   --   ALBUMIN 2.4*   < > 2.4* 2.3* 2.3*   < > = values in this interval not displayed.   No results for input(s): "LIPASE", "AMYLASE" in the last 168 hours. Recent Labs  Lab 04/30/23 0955  AMMONIA 17   Cardiac Enzymes: No results for input(s): "CKTOTAL", "CKMB", "CKMBINDEX", "TROPONINI" in the last 168 hours. Iron Studies:  No results for input(s): "IRON", "TIBC", "TRANSFERRIN", "FERRITIN" in the last 72 hours.  PT/INR: @LABRCNTIP (inr:5)  Xrays/Other Studies: ) Results for orders placed or performed during the hospital encounter of 04/29/23 (from the past 48 hours)  Glucose, capillary     Status: Abnormal   Collection Time: 05/01/23 11:29 AM  Result Value Ref Range   Glucose-Capillary 148 (H) 70 - 99 mg/dL    Comment: Glucose reference range applies only to samples taken after fasting for at least 8 hours.  Glucose, capillary     Status: Abnormal   Collection Time: 05/01/23  4:05 PM  Result Value Ref Range   Glucose-Capillary 119 (H) 70 - 99 mg/dL    Comment: Glucose reference range applies only to samples taken after fasting for at least 8 hours.  Renal function panel (daily at 1600)     Status: Abnormal   Collection Time: 05/01/23  4:23 PM  Result Value Ref Range   Sodium 133 (L) 135 - 145 mmol/L   Potassium 4.3 3.5 - 5.1 mmol/L   Chloride 99 98 - 111 mmol/L   CO2 20 (L) 22 - 32 mmol/L   Glucose, Bld 154 (H) 70 - 99 mg/dL    Comment: Glucose reference range applies only to samples taken after fasting for at least 8 hours.   BUN 44 (H) 8 - 23 mg/dL    Creatinine, Ser 1.61 (H) 0.61 - 1.24 mg/dL   Calcium 7.5 (L) 8.9 - 10.3 mg/dL   Phosphorus 3.0 2.5 - 4.6 mg/dL   Albumin 2.4 (L) 3.5 - 5.0 g/dL   GFR, Estimated 9 (L) >60 mL/min    Comment: (NOTE) Calculated using the CKD-EPI Creatinine Equation (2021)    Anion gap 14 5 - 15    Comment: Performed at [facility], [address] [place], [place] [address]  Glucose, capillary     Status: Abnormal   Collection Time: 05/01/23  8:14 PM  Result Value Ref Range   Glucose-Capillary 134 (H) 70 - 99 mg/dL    Comment: Glucose reference range applies only to samples taken after fasting for at least 8 hours.  Glucose, capillary     Status: Abnormal   Collection Time: 05/01/23 11:25 PM  Result Value Ref Range   Glucose-Capillary 104 (H) 70 - 99 mg/dL    Comment: Glucose reference range applies only to samples taken after fasting for at least 8 hours.  Glucose, capillary     Status: Abnormal   Collection Time: 05/02/23  3:30 AM  Result Value Ref Range   Glucose-Capillary 107 (H) 70 - 99 mg/dL    Comment: Glucose reference range applies only  to samples taken after fasting for at least 8 hours.  Renal function panel (daily at 0500)     Status: Abnormal   Collection Time: 05/02/23  4:23 AM  Result Value Ref Range   Sodium 133 (L) 135 - 145 mmol/L   Potassium 4.2 3.5 - 5.1 mmol/L   Chloride 99 98 - 111 mmol/L   CO2 23 22 - 32 mmol/L   Glucose, Bld 104 (H) 70 - 99 mg/dL    Comment: Glucose reference range applies only to samples taken after fasting for at least 8 hours.   BUN 50 (H) 8 - 23 mg/dL   Creatinine, Ser 6.57 (H) 0.61 - 1.24 mg/dL   Calcium 7.3 (L) 8.9 - 10.3 mg/dL   Phosphorus 3.7 2.5 - 4.6 mg/dL   Albumin 2.3 (L) 3.5 - 5.0 g/dL   GFR, Estimated 8 (L) >60 mL/min    Comment: (NOTE) Calculated using the CKD-EPI Creatinine Equation (2021)    Anion gap 11 5 - 15    Comment: Performed at [facility], [address] [place], [place] [address]  Magnesium     Status: None    Collection Time: 05/02/23  4:23 AM  Result Value Ref Range   Magnesium 2.3 1.7 - 2.4 mg/dL    Comment: Performed at [facility], [address], [place], [place] [address]  CBC     Status: Abnormal   Collection Time: 05/02/23  4:23 AM  Result Value Ref Range   WBC 7.3 4.0 - 10.5 K/uL   RBC 2.54 (L) 4.22 - 5.81 MIL/uL   Hemoglobin 7.7 (L) 13.0 - 17.0 g/dL   HCT 41.3 (L) 24.4 - 01.0 %   MCV 92.1 80.0 - 100.0 fL   MCH 30.3 26.0 - 34.0 pg   MCHC 32.9 30.0 - 36.0 g/dL   RDW 27.2 53.6 - 64.4 %   Platelets 127 (L) 150 - 400 K/uL   nRBC 0.0 0.0 - 0.2 %    Comment: Performed at [facility], [address] [place], [place] [address]  Glucose, capillary     Status: None   Collection Time: 05/02/23  7:15 AM  Result Value Ref Range   Glucose-Capillary 98 70 - 99 mg/dL    Comment: Glucose reference range applies only to samples taken after fasting for at least 8 hours.  Glucose, capillary     Status: Abnormal   Collection Time: 05/02/23 11:25 AM  Result Value Ref Range   Glucose-Capillary 169 (H) 70 - 99 mg/dL    Comment: Glucose reference range applies only to samples taken after fasting for at least 8 hours.  Glucose, capillary     Status: Abnormal   Collection Time: 05/02/23  3:16 PM  Result Value Ref Range   Glucose-Capillary 151 (H) 70 - 99 mg/dL    Comment: Glucose reference range applies only to samples taken after fasting for at least 8 hours.  Glucose, capillary     Status: Abnormal   Collection Time: 05/02/23  7:26 PM  Result Value Ref Range   Glucose-Capillary 132 (H) 70 - 99 mg/dL    Comment: Glucose reference range applies only to samples taken after fasting for at least 8 hours.  Renal function panel (daily at 0500)     Status: Abnormal   Collection Time: 05/03/23  2:29 AM  Result Value Ref Range   Sodium 134 (L) 135 - 145 mmol/L   Potassium 3.4 (L) 3.5 - 5.1 mmol/L  Chloride 98 98 - 111 mmol/L   CO2 24 22 - 32 mmol/L   Glucose, Bld 103 (H) 70 - 99 mg/dL     Comment: Glucose reference range applies only to samples taken after fasting for at least 8 hours.   BUN 35 (H) 8 - 23 mg/dL   Creatinine, Ser 1.91 (H) 0.61 - 1.24 mg/dL   Calcium 7.6 (L) 8.9 - 10.3 mg/dL   Phosphorus 2.1 (L) 2.5 - 4.6 mg/dL   Albumin 2.3 (L) 3.5 - 5.0 g/dL   GFR, Estimated 12 (L) >60 mL/min    Comment: (NOTE) Calculated using the CKD-EPI Creatinine Equation (2021)    Anion gap 12 5 - 15    Comment: Performed at [facility], [address], [place], [place] [address]  Magnesium     Status: None   Collection Time: 05/03/23  2:29 AM  Result Value Ref Range   Magnesium 2.0 1.7 - 2.4 mg/dL    Comment: Performed at [facility], [address] [place], [place] [address]  CBC     Status: Abnormal   Collection Time: 05/03/23  2:29 AM  Result Value Ref Range   WBC 7.8 4.0 - 10.5 K/uL   RBC 2.55 (L) 4.22 - 5.81 MIL/uL   Hemoglobin 7.8 (L) 13.0 - 17.0 g/dL   HCT 08.6 (L) 57.8 - 46.9 %   MCV 91.4 80.0 - 100.0 fL   MCH 30.6 26.0 - 34.0 pg   MCHC 33.5 30.0 - 36.0 g/dL   RDW 62.9 52.8 - 41.3 %   Platelets 137 (L) 150 - 400 K/uL   nRBC 0.0 0.0 - 0.2 %    Comment: Performed at [facility], [address] [place], [place] [address]  Glucose, capillary     Status: Abnormal   Collection Time: 05/03/23  3:12 AM  Result Value Ref Range   Glucose-Capillary 118 (H) 70 - 99 mg/dL    Comment: Glucose reference range applies only to samples taken after fasting for at least 8 hours.  Glucose, capillary     Status: Abnormal   Collection Time: 05/03/23  7:16 AM  Result Value Ref Range   Glucose-Capillary 144 (H) 70 - 99 mg/dL    Comment: Glucose reference range applies only to samples taken after fasting for at least 8 hours.   CT TEMPORAL BONES WO CONTRAST Result Date: 05/03/2023 CLINICAL DATA:  Initial evaluation for history of squamous cell carcinoma, status post left neck dissection with flap placement. EXAM: CT TEMPORAL BONES WITHOUT CONTRAST TECHNIQUE:  Axial and coronal plane CT imaging of the petrous temporal bones was performed with thin-collimation image reconstruction. No intravenous contrast was administered. Multiplanar CT image reconstructions were also generated. RADIATION DOSE REDUCTION: This exam was performed according to the departmental dose-optimization program which includes automated exposure control, adjustment of the mA and/or kV according to patient size and/or use of iterative reconstruction technique. COMPARISON:  Comparison made to prior neck CT from 06/05/2022 FINDINGS: RIGHT TEMPORAL BONE External auditory canal: Mild soft tissue thickening about the right EAC. No osseous erosion. Tympanic membrane thin and intact. Middle ear cavity: Tympanic cavity is largely clear. Tegmen tympani intact. Ossicular chain intact and normally formed. Inner ear structures: Vestibule, semi circular canals, and cochlea within normal limits. Internal auditory and facial nerve canals: Right IAC within normal limits. No visible mass within the right CP angle cistern. Facial nerve canal intact and bony covered to the stylomastoid foramen. Mastoid air cells:  Moderate right mastoid effusion. No osseous erosion or coalescence. Sigmoid plate intact. LEFT TEMPORAL BONE External auditory canal: Postoperative changes from prior canal wall down mastoidectomy. Fat graft fills the mastoid bowl and left middle ear cavity. Surgical clips noted within the overlying flap. No visible nodular or mass-like soft tissue density about the mastoidectomy site. No superimposed inflammatory changes or swelling. Middle ear cavity: Status post resection with fat graft in place. Tegmen tympani is thinned with associated osseous dehiscence (series 10, image 77). Inner ear structures: Vestibule, semi circular canals, and cochlea within normal limits. Internal auditory and facial nerve canals: Left IAC within normal limits. No mass lesion within the CP angle cistern. Facial nerve canal grossly  intact to the stylomastoid foramen. Mastoid air cells: Residual mastoid air cells are opacified. No erosive changes or coalescence. Sigmoid plate remains intact. Vascular: No abnormality seen about either carotid canal or jugular bulb. Vascular calcifications noted about the carotid siphons. Limited intracranial:  No acute or significant finding. Visible orbits/paranasal sinuses: Visualized globes and orbital soft tissues within normal limits. Visualized paranasal sinuses mild mucoperiosteal thickening present about the visualized maxillary sinuses. Visualized paranasal sinuses are otherwise largely clear. Soft tissues: External soft tissues demonstrate no acute abnormality. No swelling or inflammatory changes. No visible soft tissue implants or abnormal skin thickening. IMPRESSION: 1. Postoperative changes from prior left canal wall down mastoidectomy with fat graft placement. No visible nodular or mass-like soft tissue density about the mastoidectomy site. No superimposed inflammatory changes or other abnormality to explain patient's symptoms. 2. Osseous dehiscence of the left tegmen tympani as detailed above. 3. Moderate right mastoid effusion. No osseous erosion or coalescence. Electronically Signed   By: Rise Mu M.D.   On: 05/03/2023 02:10    PMH:   Past Medical History:  Diagnosis Date   Arthritis    Cellulitis 03/14/2019   feet   CKD (chronic kidney disease)    Diabetes mellitus without complication (HCC)    Type II   End-stage kidney disease (HCC) 03/14/2019   was on hemodialysis ~ June 2021 - March 2022 when he discontinued; followed by Dr. Zetta Bills   GERD (gastroesophageal reflux disease)    Heart murmur    History of blood transfusion    Hypertension    12/30/19- having low blood pressure since beginning   Irregular heart beat    UTI (urinary tract infection)     PSH:   Past Surgical History:  Procedure Laterality Date   arm surgery Right    was cut to the bone    BASCILIC VEIN TRANSPOSITION Left 11/18/2019   Procedure: FIRST STAGE LEFT ARM BASCILIC VEIN TRANSPOSITION;  Surgeon: Nada Libman, MD;  Location: [facility] OR;  Service: Vascular;  Laterality: Left;   BASCILIC VEIN TRANSPOSITION Left 01/04/2020   Procedure: LEFT SECOND STAGE BASCILIC VEIN TRANSPOSITION;  Surgeon: Nada Libman, MD;  Location: [facility] OR;  Service: Vascular;  Laterality: Left;   COLONOSCOPY  06/2019   DIRECT LARYNGOSCOPY Bilateral 08/06/2022   Procedure: DIRECT LARYNGOSCOPY WITH BIOPSY;  Surgeon: Christia Reading, MD;  Location: [facility] OR;  Service: ENT;  Laterality: Bilateral;   EYE SURGERY Bilateral    cataract   NASOPHARYNGEAL BIOPSY Left 08/06/2022   Procedure: NASOPHARYNGEAL BIOPSY;  Surgeon: Christia Reading, MD;  Location: [facility] OR;  Service: ENT;  Laterality: Left;   TONSILLECTOMY Left 08/06/2022   Procedure: TONSILLECTOMY;  Surgeon: Christia Reading, MD;  Location: [facility] OR;  Service: ENT;  Laterality: Left;   TRANSURETHRAL RESECTION OF PROSTATE  N/A 08/08/2019   Procedure: TRANSURETHRAL RESECTION OF THE PROSTATE (TURP);  Surgeon: Crista Elliot, MD;  Location: [facility] ORS;  Service: Urology;  Laterality: N/A;   UPPER GASTROINTESTINAL ENDOSCOPY  06/2019   WISDOM TOOTH EXTRACTION      Allergies:  Allergies  Allergen Reactions   Hydrocodone Hives and Itching   Percocet [Oxycodone-Acetaminophen] Hives and Itching    Medications:   Prior to Admission medications   Medication Sig Start Date End Date Taking? Authorizing Provider  acetaminophen (TYLENOL) 500 MG tablet Take 1,000 mg by mouth every 6 (six) hours as needed for headache (pain).    Yes [provider]  amLODipine (NORVASC) 5 MG tablet Take 1 tablet (5 mg total) by mouth at bedtime. 10/22/22  Yes Latrelle Dodrill, MD  carvedilol (COREG) 25 MG tablet Take 25 mg by mouth 2 (two) times daily with a meal.   Yes [provider]  Cholecalciferol (VITAMIN D3) 125 MCG (5000 UT) CAPS Take 5,000 Units by mouth daily.   Yes  [provider]  esomeprazole (NEXIUM) 40 MG capsule TAKE 1 CAPSULE BY MOUTH ONCE DAILY AT  NOON Patient taking differently: Take 40 mg by mouth daily as needed (for heartburn). 08/23/21  Yes Mansouraty, Netty Starring., MD  gabapentin (NEURONTIN) 300 MG capsule Take 1 capsule (300 mg total) by mouth at bedtime. 01/12/23  Yes Latrelle Dodrill, MD  LANTUS SOLOSTAR 100 UNIT/ML Solostar Pen Inject 6 units subcutaneously once daily 01/21/23  Yes Latrelle Dodrill, MD  loperamide (IMODIUM A-D) 2 MG tablet Take 1 tablet (2 mg total) by mouth 4 (four) times daily as needed for diarrhea or loose stools. 06/12/20  Yes Latrelle Dodrill, MD  mupirocin ointment (BACTROBAN) 2 % Apply 1 Application topically 2 (two) times daily. Left ear canal   Yes [provider]  sodium bicarbonate 650 MG tablet Take 650 mg by mouth 2 (two) times daily.   Yes [provider]  tamsulosin (FLOMAX) 0.4 MG CAPS capsule Take 0.4 mg by mouth daily.   Yes [provider]  traMADol (ULTRAM) 50 MG tablet Take 1 tablet (50 mg total) by mouth every 6 (six) hours as needed. Patient taking differently: Take 50 mg by mouth every 6 (six) hours as needed for moderate pain (pain score 4-6). 02/26/22  Yes Latrelle Dodrill, MD  trimethoprim (TRIMPEX) 100 MG tablet Take 100 mg by mouth daily. 01/08/22  Yes [provider]  VELTASSA 8.4 g packet Take 1 packet by mouth daily.   Yes [provider]  blood glucose meter kit and supplies KIT Dispense based on patient and insurance preference. Use up to four times daily as directed. (FOR ICD-9 250.00, 250.01). 03/28/19   Hongalgi, Maximino Greenland, MD  Insulin Syringes, Disposable, U-100 0.5 ML MISC Use as per instructions 3 times daily AC. 03/28/19   Hongalgi, Maximino Greenland, MD    Discontinued Meds:   Medications Discontinued During This Encounter  Medication Reason   sodium chloride 0.9 % bolus 500 mL    hydrocortisone 2.5 % cream Patient Preference    famotidine (PEPCID) 20 MG tablet Patient Preference   sodium zirconium cyclosilicate (LOKELMA) packet 10 g    0.9 %  sodium chloride infusion    insulin aspart (novoLOG) injection 0-6 Units    sodium zirconium cyclosilicate (LOKELMA) packet 10 g    patiromer Lelon Perla) packet 1 packet     prismasol BGK 4/2.5 infusion     prismasol BGK 4/2.5 infusion  prismasol BGK 4/2.5 infusion    midodrine (PROAMATINE) tablet 10 mg    norepinephrine (LEVOPHED) 4mg  in (0.016 mg/mL) premix infusion    midodrine (PROAMATINE) tablet 5 mg    insulin aspart (novoLOG) injection 0-6 Units    norepinephrine (LEVOPHED) 4mg  in (0.016 mg/mL) premix infusion    piperacillin-tazobactam (ZOSYN) IVPB 3.375 g    norepinephrine (LEVOPHED) 4mg  in (0.016 mg/mL) premix infusion    norepinephrine (LEVOPHED) 4mg  in (0.016 mg/mL) premix infusion     Social History:  reports that he quit smoking about 6 months ago. His smoking use included cigarettes. He started smoking about 54 years ago. He has a 25.6 pack-year smoking history. He has never used smokeless tobacco. He reports that he does not currently use alcohol. He reports that he does not use drugs.  Family History:   Family History  Problem Relation Age of Onset   Diabetes type II Mother    Lung cancer Mother    Hypertension Mother    Diabetes type II Father    Diabetes type II Sister    Diabetes type II Brother    Hypertension Brother    Other Son        burned   CAD Neg Hx    Colon cancer Neg Hx    Esophageal cancer Neg Hx    Inflammatory bowel disease Neg Hx    Liver disease Neg Hx    Pancreatic cancer Neg Hx    Rectal cancer Neg Hx    Stomach cancer Neg Hx     Blood pressure (!) 120/52, pulse 76, temperature 97.9 F (36.6 C), temperature source Oral, resp. rate 17, height 6\' 3"  (1.905 m), weight 78.6 kg, SpO2 99%. 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

## 2023-05-03 NOTE — Progress Notes (Signed)
Received patient in bed  Alert and oriented.  Informed consent signed and in chart.   TX duration:3 hours  Patient tolerated well.  Alert, without acute distress.  Hand-off given to patient's nurse.   Access used: AVF Access issues: none  Total UF removed: 1L Medication(s) given: none Post HD VS: see table below Post HD weight: 78.6kg   05/03/23 0430  Vitals  Temp 97.7 F (36.5 C)  Temp Source Oral  BP 114/61  MAP (mmHg) 72  BP Location Right Arm  BP Method Automatic  Patient Position (if appropriate) Lying  Pulse Rate 75  Pulse Rate Source Monitor  ECG Heart Rate 74  Resp 13  Oxygen Therapy  SpO2 98 %  O2 Device Room Air  Patient Activity (if Appropriate) In bed  Pulse Oximetry Type Continuous  During Treatment Monitoring  HD Safety Checks Performed Yes  Intra-Hemodialysis Comments Tolerated well  Post Treatment  Dialyzer Clearance Clear  Hemodialysis Intake (mL) 0 mL  Liters Processed 54  Fluid Removed (mL) 1000 mL  Tolerated HD Treatment Yes  Post-Hemodialysis Comments goal met  AVG/AVF Arterial Site Held (minutes) 5 minutes  AVG/AVF Venous Site Held (minutes) 5 minutes  Fistula / Graft Left Forearm Arteriovenous fistula  Placement Date/Time: 11/18/19 0847   Orientation: Left  Access Location: Forearm  Access Type: Arteriovenous fistula  Site Condition No complications  Fistula / Graft Assessment Present;Thrill;Bruit;Aneurysm present  Status Patent      [facility]

## 2023-05-04 DIAGNOSIS — N186 End stage renal disease: Secondary | ICD-10-CM

## 2023-05-04 DIAGNOSIS — R4589 Other symptoms and signs involving emotional state: Secondary | ICD-10-CM | POA: Diagnosis present

## 2023-05-04 LAB — RENAL FUNCTION PANEL
Albumin: 2.1 g/dL — ABNORMAL LOW (ref 3.5–5.0)
Anion gap: 11 (ref 5–15)
BUN: 31 mg/dL — ABNORMAL HIGH (ref 8–23)
CO2: 23 mmol/L (ref 22–32)
Calcium: 7.1 mg/dL — ABNORMAL LOW (ref 8.9–10.3)
Chloride: 98 mmol/L (ref 98–111)
Creatinine, Ser: 6.74 mg/dL — ABNORMAL HIGH (ref 0.61–1.24)
GFR, Estimated: 8 mL/min — ABNORMAL LOW (ref >60)
Glucose, Bld: 96 mg/dL (ref 70–99)
Phosphorus: 2.8 mg/dL (ref 2.5–4.6)
Potassium: 3.3 mmol/L — ABNORMAL LOW (ref 3.5–5.1)
Sodium: 132 mmol/L — ABNORMAL LOW (ref 135–145)

## 2023-05-04 LAB — CBC
HCT: 22 % — ABNORMAL LOW (ref 39.0–52.0)
Hemoglobin: 7 g/dL — ABNORMAL LOW (ref 13.0–17.0)
MCH: 30 pg (ref 26.0–34.0)
MCHC: 31.8 g/dL (ref 30.0–36.0)
MCV: 94.4 fL (ref 80.0–100.0)
Platelets: 140 10*3/uL — ABNORMAL LOW (ref 150–400)
RBC: 2.33 MIL/uL — ABNORMAL LOW (ref 4.22–5.81)
RDW: 13.1 % (ref 11.5–15.5)
WBC: 8.3 10*3/uL (ref 4.0–10.5)
nRBC: 0 % (ref 0.0–0.2)

## 2023-05-04 LAB — GLUCOSE, CAPILLARY
Glucose-Capillary: 93 mg/dL (ref 70–99)
Glucose-Capillary: 238 mg/dL — ABNORMAL HIGH (ref 70–99)
Glucose-Capillary: 128 mg/dL — ABNORMAL HIGH (ref 70–99)
Glucose-Capillary: 160 mg/dL — ABNORMAL HIGH (ref 70–99)

## 2023-05-04 LAB — MAGNESIUM: Magnesium: 1.8 mg/dL (ref 1.7–2.4)

## 2023-05-04 MED ORDER — SODIUM CHLORIDE 0.9 % IV SOLN
1.0000 g | Freq: Once | INTRAVENOUS | Status: AC
  Administered 2023-05-04: 1 g via INTRAVENOUS
  Filled 2023-05-04: qty 10

## 2023-05-04 MED ORDER — DARBEPOETIN ALFA 60 MCG/0.3ML IJ SOSY
60.0000 ug | PREFILLED_SYRINGE | INTRAMUSCULAR | Status: DC
  Administered 2023-05-04: 60 ug via SUBCUTANEOUS
  Filled 2023-05-04: qty 0.3

## 2023-05-04 MED ORDER — SODIUM CHLORIDE 0.9 % IV SOLN
2.0000 g | INTRAVENOUS | Status: AC
  Administered 2023-05-05 – 2023-05-07 (×2): 2 g via INTRAVENOUS
  Filled 2023-05-04 (×2): qty 12.5

## 2023-05-04 NOTE — Progress Notes (Signed)
Patient ID: Louis Butler, male   DOB: [birth year], 68 y.o.   MRN: [id number] S: No new complaints O:BP (!) 95/53   Pulse 66   Temp 97.9 F (36.6 C) (Axillary)   Resp 14   Ht 6\' 3"  (1.905 m)   Wt 78.6 kg   SpO2 94%   BMI 21.66 kg/m   Intake/Output Summary (Last 24 hours) at 05/04/2023 1107 Last data filed at 05/04/2023 0800 Gross per 24 hour  Intake 220 ml  Output 180 ml  Net 40 ml   Intake/Output: I/O last 3 completed shifts: In: 335.9 [I.V.:30.9; Other:155; IV Piggyback:150] Out: 1435 [Urine:435; Other:1000]  Intake/Output this shift:  Total I/O In: 100 [P.O.:100] Out: -  Weight change:  Gen: NAD CVS: RRR Resp:CTA Abd: +BS, soft, TN/ND Ext: no edema, LUE AVF +T/B  Recent Labs  Lab 04/30/23 1622 04/30/23 2052 04/30/23 2224 05/01/23 0443 05/01/23 1623 05/02/23 0423 05/03/23 0229 05/04/23 0329  NA 133*  --  132* 134* 133* 133* 134* 132*  K 4.5  --  4.7 4.6 4.3 4.2 3.4* 3.3*  CL 97*  --  99 101 99 99 98 98  CO2 20*  --  18* 21* 20* 23 24 23   GLUCOSE 122*  --  164* 144* 154* 104* 103* 96  BUN 109*  --  83* 58* 44* 50* 35* 31*  CREATININE 12.45*  --  10.02* 7.13* 6.28* 7.33* 5.09* 6.74*  ALBUMIN 2.6* 2.4* 2.5* 2.5* 2.4* 2.3* 2.3* 2.1*  CALCIUM 7.7*  --  7.4* 7.5* 7.5* 7.3* 7.6* 7.1*  PHOS 5.5*  --  4.4 3.5 3.0 3.7 2.1* 2.8  AST  --  15  --   --   --   --   --   --   ALT  --  11  --   --   --   --   --   --    Liver Function Tests: Recent Labs  Lab 04/30/23 2052 04/30/23 2224 05/02/23 0423 05/03/23 0229 05/04/23 0329  AST 15  --   --   --   --   ALT 11  --   --   --   --   ALKPHOS 47  --   --   --   --   BILITOT 1.2  --   --   --   --   PROT 7.0  --   --   --   --   ALBUMIN 2.4*   < > 2.3* 2.3* 2.1*   < > = values in this interval not displayed.   No results for input(s): "LIPASE", "AMYLASE" in the last 168 hours. Recent Labs  Lab 04/30/23 0955  AMMONIA 17   CBC: Recent Labs  Lab 04/30/23 0410 05/01/23 0443 05/02/23 0423 05/03/23 0229  05/04/23 0329  WBC 6.5 8.3 7.3 7.8 8.3  HGB 8.2* 8.3* 7.7* 7.8* 7.0*  HCT 23.7* 24.6* 23.4* 23.3* 22.0*  MCV 87.5 88.2 92.1 91.4 94.4  PLT 133* 140* 127* 137* 140*   Cardiac Enzymes: No results for input(s): "CKTOTAL", "CKMB", "CKMBINDEX", "TROPONINI" in the last 168 hours. CBG: Recent Labs  Lab 05/03/23 0716 05/03/23 1122 05/03/23 1510 05/03/23 2250 05/04/23 0745  GLUCAP 144* 225* 143* 129* 93    Iron Studies: No results for input(s): "IRON", "TIBC", "TRANSFERRIN", "FERRITIN" in the last 72 hours. Studies/Results: CT TEMPORAL BONES WO CONTRAST Result Date: 05/03/2023 CLINICAL DATA:  Initial evaluation for history of squamous cell carcinoma, status post left neck dissection  with flap placement. EXAM: CT TEMPORAL BONES WITHOUT CONTRAST TECHNIQUE: Axial and coronal plane CT imaging of the petrous temporal bones was performed with thin-collimation image reconstruction. No intravenous contrast was administered. Multiplanar CT image reconstructions were also generated. RADIATION DOSE REDUCTION: This exam was performed according to the departmental dose-optimization program which includes automated exposure control, adjustment of the mA and/or kV according to patient size and/or use of iterative reconstruction technique. COMPARISON:  Comparison made to prior neck CT from 06/05/2022 FINDINGS: RIGHT TEMPORAL BONE External auditory canal: Mild soft tissue thickening about the right EAC. No osseous erosion. Tympanic membrane thin and intact. Middle ear cavity: Tympanic cavity is largely clear. Tegmen tympani intact. Ossicular chain intact and normally formed. Inner ear structures: Vestibule, semi circular canals, and cochlea within normal limits. Internal auditory and facial nerve canals: Right IAC within normal limits. No visible mass within the right CP angle cistern. Facial nerve canal intact and bony covered to the stylomastoid foramen. Mastoid air cells: Moderate right mastoid effusion. No osseous  erosion or coalescence. Sigmoid plate intact. LEFT TEMPORAL BONE External auditory canal: Postoperative changes from prior canal wall down mastoidectomy. Fat graft fills the mastoid bowl and left middle ear cavity. Surgical clips noted within the overlying flap. No visible nodular or mass-like soft tissue density about the mastoidectomy site. No superimposed inflammatory changes or swelling. Middle ear cavity: Status post resection with fat graft in place. Tegmen tympani is thinned with associated osseous dehiscence (series 10, image 77). Inner ear structures: Vestibule, semi circular canals, and cochlea within normal limits. Internal auditory and facial nerve canals: Left IAC within normal limits. No mass lesion within the CP angle cistern. Facial nerve canal grossly intact to the stylomastoid foramen. Mastoid air cells: Residual mastoid air cells are opacified. No erosive changes or coalescence. Sigmoid plate remains intact. Vascular: No abnormality seen about either carotid canal or jugular bulb. Vascular calcifications noted about the carotid siphons. Limited intracranial:  No acute or significant finding. Visible orbits/paranasal sinuses: Visualized globes and orbital soft tissues within normal limits. Visualized paranasal sinuses mild mucoperiosteal thickening present about the visualized maxillary sinuses. Visualized paranasal sinuses are otherwise largely clear. Soft tissues: External soft tissues demonstrate no acute abnormality. No swelling or inflammatory changes. No visible soft tissue implants or abnormal skin thickening. IMPRESSION: 1. Postoperative changes from prior left canal wall down mastoidectomy with fat graft placement. No visible nodular or mass-like soft tissue density about the mastoidectomy site. No superimposed inflammatory changes or other abnormality to explain patient's symptoms. 2. Osseous dehiscence of the left tegmen tympani as detailed above. 3. Moderate right mastoid effusion. No  osseous erosion or coalescence. Electronically Signed   By: Rise Mu M.D.   On: 05/03/2023 02:10    Chlorhexidine Gluconate Cloth  6 each Topical Q0600   cholecalciferol  5,000 Units Oral Once per day on Monday Thursday   feeding supplement  1 Container Oral BID BM   gabapentin  300 mg Oral QHS   heparin injection (subcutaneous)  5,000 Units Subcutaneous Q8H   insulin aspart  0-6 Units Subcutaneous TID WC   melatonin  3 mg Oral Once   midodrine  10 mg Oral TID WC   multivitamin  1 tablet Oral QHS   pantoprazole  40 mg Oral Daily   sertraline  25 mg Oral Daily   tamsulosin  0.4 mg Oral Daily    BMET    Component Value Date/Time   NA 132 (L) 05/04/2023 0329   NA 133 (L)  06/10/2022 1528   K 3.3 (L) 05/04/2023 0329   CL 98 05/04/2023 0329   CO2 23 05/04/2023 0329   GLUCOSE 96 05/04/2023 0329   BUN 31 (H) 05/04/2023 0329   BUN 53 (H) 06/10/2022 1528   CREATININE 6.74 (H) 05/04/2023 0329   CALCIUM 7.1 (L) 05/04/2023 0329   CALCIUM 6.5 (LL) 04/30/2023 0410   GFRNONAA 8 (L) 05/04/2023 0329   GFRAA 15 (L) 08/09/2019 0523   CBC    Component Value Date/Time   WBC 8.3 05/04/2023 0329   RBC 2.33 (L) 05/04/2023 0329   HGB 7.0 (L) 05/04/2023 0329   HGB 10.7 (L) 06/02/2022 1217   HCT 22.0 (L) 05/04/2023 0329   HCT 32.2 (L) 06/02/2022 1217   PLT 140 (L) 05/04/2023 0329   PLT 159 06/02/2022 1217   MCV 94.4 05/04/2023 0329   MCV 99 (H) 06/02/2022 1217   MCH 30.0 05/04/2023 0329   MCHC 31.8 05/04/2023 0329   RDW 13.1 05/04/2023 0329   RDW 13.0 06/02/2022 1217   LYMPHSABS 1.6 06/02/2022 1217   MONOABS 0.7 04/08/2019 1621   EOSABS 0.3 06/02/2022 1217   BASOSABS 0.1 06/02/2022 1217   Emmanuelle L Macke is an 68 y.o. male with CKD 5 (f/b Dr. Ricard Dillon), DM, HTN, recent squamous cell ca H&N (surgery, XRT 2024), anemia who presented with N/V/abd pain and was found to have marked hyperkalemia and renal failure for which nephrology is consulted.  Was on HD 2021-06/2020 when he self  discontinued and has followed in clinic since.  Last OV with Dr. Allena Katz was 02/19/24 - GFR11, Hb 9.6.  Pt unclear if he would be accepting of long term dialysis   Assessment/Plan:  New ESRD - tolerating IHD and had his second HD session yesterday.  Will plan for third tomorrow.  CLIP process started and he wants [facility] only because he wants to be with Dr. Eliane Decree unit. 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  Obstructive uropathy - now with foley  Disposition - awaiting for outpatient HD arrangements  Vascular access - using LUE AVF without issues.  Irena Cords, MD [employer] [phone]

## 2023-05-04 NOTE — Progress Notes (Signed)
Daily Progress Note Intern Pager: [phone]  Patient name: Louis Butler Medical record number: [id number] Date of birth: Nov 12, 1955 Age: 68 y.o. 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 CLIP process started, waiting on outpatient dialysis establishment. Assessment & Plan ESRD (end stage renal disease) (HCC) Encephalopathy and electrolyte abnormality resolved with dialysis. Currently undergoing CLIP process, will continue TTS Dialysis outpatient in [facility] -Nephrology following, appreciate recommendations -Continue midodrine 10mg  TID and with dialysis Hyperkalemia K 3.3 this morning, improved with dialysis -am RFP Urinary tract infection  Bilateral Hydronephrosis Pseudomonal UTI, with evidence of bilateral hydronephrosis c/f outlet obstruction. Urology has seen, recommend keep foley in place until OP f/u, and 14 day antibiotic course, OP cystoscopy. -Pending susceptibilities  -Zosyn (1/24-1/27), switch to Cefepime today and discharge with 10 day antibiotic course -Keep foley in place Anemia Hgb stable 7.7 >7.8>7.0. Normocytic. Suspect anemia of chronic disease. Iron studies including ferritin of 648, makes iron deficiency less likely. C/f iron overload- may be related to infection, inflammatory state or malignancy. Liver function studies WNL, lower concern for hereditary hemochromatosis. -Continue to monitor -Recommend OP follow-up Type 2 diabetes mellitus with diabetic chronic kidney disease (HCC) A1C of 6.8. On 6U lantus at home. Well controlled during admission.  -CBGs -very sensitive  SSI Depressed mood Depressed mood recently. 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 CT temporal bones showing post-operative changes without new masses or acute findings to explain L ear pain.  -[facility]up [facility] Ulcer of sacral region, stage 2 (HCC) -WOC, frequent turns -RD recs   Chronic and Stable Problems:  HTN- holding BP medications 2/2 soft pressures, resume as tolerated GERD-Protonix 40 mg daily   FEN/GI: Regular diet PPx: Heparin Dispo:Home with home health pending clinical improvement   Subjective:  NAEON. Feeling well this morning. Reports dizziness when he stands up.   Objective: Temp:  [97.3 F (36.3 C)-98.7 F (37.1 C)] 98 F (36.7 C) (01/27 0337) Pulse Rate:  [58-111] 66 (01/27 0600) Resp:  [11-18] 14 (01/27 0600) BP: (91-132)/(43-82) 95/53 (01/27 0600) SpO2:  [93 %-100 %] 94 % (01/27 0600) Physical Exam: General: chronically ill appearing, NAD Cardiovascular: RRR, no m/r/g Respiratory: normal work of breathing on RA, CTAB Abdomen: Normal bowel sounds, soft, non-tender Extremities: No swelling BLE  Laboratory: Most recent CBC Lab Results  Component Value Date   WBC 8.3 05/04/2023   HGB 7.0 (L) 05/04/2023   HCT 22.0 (L) 05/04/2023   MCV 94.4 05/04/2023   PLT 140 (L) 05/04/2023   Most recent BMP    Latest Ref Rng & Units 05/04/2023    3:29 AM  BMP  Glucose 70 - 99 mg/dL 96   BUN 8 - 23 mg/dL 31   Creatinine 2.95 - 1.24 mg/dL 6.21   Sodium 308 - 657 mmol/L 132   Potassium 3.5 - 5.1 mmol/L 3.3   Chloride 98 - 111 mmol/L 98   CO2 22 - 32 mmol/L 23  Calcium 8.9 - 10.3 mg/dL 7.1     Imaging/Diagnostic Tests: CT Temporal Bones 05/03/23 IMPRESSION: 1. Postoperative changes from prior left canal wall down mastoidectomy with fat graft placement. No visible nodular  or mass-like soft tissue density about the mastoidectomy site. No superimposed inflammatory changes or other abnormality to explain patient's symptoms. 2. Osseous dehiscence of the left tegmen tympani as detailed above. 3. Moderate right mastoid effusion. No osseous erosion or coalescence.  Debie Ashline, DO 05/04/2023, 7:12 AM  PGY-1, [facility] FPTS Intern pager: [phone], text pages welcome Secure chat group [facility] Teaching Service

## 2023-05-04 NOTE — Progress Notes (Signed)
Contacted [facility] admissions to request an update on pt's referral. Awaiting response. Will assist as needed.   Olivia Canter Renal Navigator [phone]

## 2023-05-04 NOTE — Evaluation (Addendum)
Occupational Therapy Evaluation Patient Details Name: Louis Butler MRN: [id number] DOB: Nov 02, 1955 Today's Date: 05/04/2023   History of Present Illness 68 y.o. male presented 1/22 with N/V/abd pain and was found to have marked hyperkalemia and renal failure. CRRT 1/23-1/24. 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 Ricard Dillon), DM, HTN, recent squamous cell ca H&N (surgery, XRT 2024), anemia.   Clinical Impression   Pt is typically independent and drives. He lives with his supportive wife and son. His family outside of the home helps with IADLs. Pt presents with generalized weakness and decreased activity tolerance. He has been routinely walking to the bathroom with RW and CGA. He needs set up to [facility] for ADLs and can perform figure 4 with LEs to access his feet for donning socks. Pt is well equipped at home with exception of a hospital bed with mattress overlay, which is wife is requesting. Will follow acutely to address ADL independence, endurance and energy conservation strategies. Do not anticipate pt will need post acute OT.       If plan is discharge home, recommend the following: A little help with walking and/or transfers;A little help with bathing/dressing/bathroom;Assistance with cooking/housework;Assist for transportation;Help with stairs or ramp for entrance    Functional Status Assessment  Patient has had a recent decline in their functional status and demonstrates the ability to make significant improvements in function in a reasonable and predictable amount of time.  Equipment Recommendations  Hospital bed;Other (comment) (mattress overlay)    Recommendations for Other Services       Precautions / Restrictions Precautions Precautions: Fall Precaution Comments: Monitor BP Restrictions Weight Bearing Restrictions Per Provider Order: No      Mobility Bed Mobility Overal bed mobility: Needs Assistance Bed Mobility: Supine to Sit, Sit to Supine     Supine to sit: Contact guard Sit to  supine: Contact guard assist   General bed mobility comments: wife requests hospital bed with mattress overlay due to skin breakdown    Transfers Overall transfer level: Needs assistance Equipment used: Rolling walker (2 wheels), 1 person hand held assist   Sit to Stand: Contact guard assist           General transfer comment: Pt has been routinely walking with assist and RW to bathroom.      Balance Overall balance assessment: Needs assistance                                         ADL either performed or assessed with clinical judgement   ADL Overall ADL's : Needs assistance/impaired Eating/Feeding: Independent;Bed level   Grooming: Set up;Sitting;Bed level   Upper Body Bathing: Set up;Sitting   Lower Body Bathing: Contact guard assist;Sit to/from stand   Upper Body Dressing : Set up;Sitting   Lower Body Dressing: Contact guard assist;Sit to/from stand   Toilet Transfer: Contact guard assist;Ambulation;Rolling walker (2 wheels)           Functional mobility during ADLs: Contact guard assist;Rolling walker (2 wheels) General ADL Comments: Can perform figure 4 for donning shoes and socks. Educated in energy conservation and recommended supervision for tub transfer. Needs to improve endurance.     Vision Ability to See in Adequate Light: 0 Adequate Patient Visual Report: No change from baseline       Perception         Praxis  Pertinent Vitals/Pain Pain Assessment Pain Assessment: No/denies pain     Extremity/Trunk Assessment Upper Extremity Assessment Upper Extremity Assessment: Overall WFL for tasks assessed;Right hand dominant   Lower Extremity Assessment Lower Extremity Assessment: Defer to PT evaluation       Communication Communication Communication: Hearing impairment   Cognition Arousal: Alert Behavior During Therapy: WFL for tasks assessed/performed Overall Cognitive Status: Within Functional Limits for  tasks assessed                                 General Comments: HOH     General Comments       Exercises     Shoulder Instructions      Home Living Family/patient expects to be discharged to:: Private residence Living Arrangements: Spouse/significant other;Children Available Help at Discharge: Family;Available 24 hours/day Type of Home: House Home Access: Ramped entrance     Home Layout: One level     Bathroom Shower/Tub: Chief Strategy Officer: Standard     Home Equipment: Agricultural consultant (2 wheels);Rollator (4 wheels);Cane - single point;Shower seat;Wheelchair - manual;BSC/3in1          Prior Functioning/Environment Prior Level of Function : Independent/Modified Independent;Driving                        OT Problem List: Decreased activity tolerance;Impaired balance (sitting and/or standing)      OT Treatment/Interventions: Self-care/ADL training;DME and/or AE instruction;Therapeutic activities;Patient/family education;Balance training;Energy conservation    OT Goals(Current goals can be found in the care plan section) Acute Rehab OT Goals OT Goal Formulation: With patient/family Time For Goal Achievement: 05/18/23 Potential to Achieve Goals: Good ADL Goals Pt Will Perform Grooming: with supervision;standing Pt Will Perform Lower Body Bathing: with supervision;sit to/from stand Pt Will Perform Lower Body Dressing: with supervision;sit to/from stand Pt Will Transfer to Toilet: with supervision;ambulating;bedside commode (over toilet) Pt Will Perform Toileting - Clothing Manipulation and hygiene: with supervision;sit to/from stand Additional ADL Goal #1: Pt will state at least 3 energy conservation strategies as instructed.  OT Frequency: Min 1X/week    Co-evaluation              AM-PAC OT "6 Clicks" Daily Activity     Outcome Measure Help from another person eating meals?: None Help from another person taking care  of personal grooming?: A Little Help from another person toileting, which includes using toliet, bedpan, or urinal?: A Little Help from another person bathing (including washing, rinsing, drying)?: A Little Help from another person to put on and taking off regular upper body clothing?: A Little Help from another person to put on and taking off regular lower body clothing?: A Little 6 Click Score: 19   End of Session    Activity Tolerance: Patient tolerated treatment well Patient left: in bed;with call bell/phone within reach;with bed alarm set;with family/visitor present  OT Visit Diagnosis: Unsteadiness on feet (R26.81);Muscle weakness (generalized) (M62.81)                Time: 0981-1914 OT Time Calculation (min): 20 min Charges:  OT General Charges $OT Visit: 1 Visit OT Evaluation $OT Eval Low Complexity: 1 Low $OT Eval Moderate Complexity: 1 Mod Berna Spare, OTR/L Acute Rehabilitation Services Office: [phone]   Evern Bio 05/04/2023, 3:19 PM

## 2023-05-04 NOTE — Progress Notes (Signed)
    Durable Medical Equipment  (From admission, onward)           Start     Ordered   05/04/23 1653  For home use only DME Hospital bed  Once       Comments: Patient requires frequent changes in body position ways not feasible with a regular bed.  Question Answer Comment  Length of Need Lifetime   The above medical condition requires: Patient requires the ability to reposition frequently   Head must be elevated greater than: 45 degrees   Bed type Semi-electric   Support Surface: Gel Overlay      05/04/23 1659   05/04/23 1642  For home use only DME Overbed table  Once        05/04/23 1641

## 2023-05-04 NOTE — Assessment & Plan Note (Signed)
-  WOC, frequent turns -RD recs

## 2023-05-04 NOTE — Assessment & Plan Note (Addendum)
Encephalopathy and electrolyte abnormality resolved with dialysis. Currently undergoing CLIP process, will continue TTS Dialysis outpatient in [facility] -Nephrology following, appreciate recommendations -Continue midodrine 10mg  TID and with dialysis

## 2023-05-04 NOTE — Assessment & Plan Note (Addendum)
S/p L neck dssection, L temporal bone resection and L ear canal dissection/flap in 10/2022. CT temporal bones showing post-operative changes without new masses or acute findings to explain L ear pain.  -Follow-up with [facility]

## 2023-05-04 NOTE — Assessment & Plan Note (Addendum)
A1C of 6.8. On 6U lantus at home. Well controlled during admission.  -CBGs -very sensitive SSI

## 2023-05-04 NOTE — Assessment & Plan Note (Signed)
K 3.3 this morning, improved with dialysis -am RFP

## 2023-05-04 NOTE — Assessment & Plan Note (Signed)
Depressed mood recently. Sertraline 25mg  started while inpatient - continue to monitor

## 2023-05-04 NOTE — TOC CM/SW Note (Signed)
Transition of Care [facility]) - Inpatient Brief Assessment   Patient Details  Name: Louis Butler MRN: [id number] Date of Birth: [birth year]  Transition of Care [facility]) CM/SW Contact:    Tom-Johnson, Hershal Coria, RN Phone Number: 05/04/2023, 4:43 PM   Clinical Narrative:  Patient presented to the ED with Generalized weakness and Nausea. Admitted with Hyperkalemia. 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   Patient is referred to [facility] admissions for outpatient HD by Renal Navigator, awaiting response.   From home with wife, has two supportive children. Has a cane, walker, shower seat and BSC at home. Hospital bed recommended, order called to [employer] and Earna Coder to deliver to patient's home. Patient's wife requested bedside table, CM informed by [employer] that insurance does not cover bedside table, patient will have to pay out of pocket. CM notified patient's wife.  Home health recommended, patient has non preference. Referral sent to [facility] and Amy noted acceptance, info on AVS. Patient's wife is scheduled for surgery tomorrow 05/05/23. States their son Loraine Leriche will assist. PCP is Latrelle Dodrill, MD and uses [facility] on [address] in [place]. Also uses [facility] mail order script.   Patient not Medically ready for discharge.  CM will continue to follow as patient progresses with care towards discharge.         Transition of Care Asessment: Insurance and Status: Insurance coverage has been reviewed Patient has primary care physician: Yes Home environment has been reviewed: Yes Prior level of function:: Modified Independent Prior/Current Home Services: No current home services Social Drivers of Health Review: SDOH reviewed no interventions necessary Readmission risk has been reviewed: Yes Transition of care needs: transition of care needs identified, TOC will continue to follow

## 2023-05-04 NOTE — Assessment & Plan Note (Addendum)
Hgb stable 7.7 >7.8>7.0. Normocytic. Suspect anemia of chronic disease. Iron studies including ferritin of 648, makes iron deficiency less likely. C/f iron overload- may be related to infection, inflammatory state or malignancy. Liver function studies WNL, lower concern for hereditary hemochromatosis. -Continue to monitor -Recommend OP follow-up

## 2023-05-04 NOTE — Assessment & Plan Note (Addendum)
Pseudomonal UTI, with evidence of bilateral hydronephrosis c/f outlet obstruction. Urology has seen, recommend keep foley in place until OP f/u, and 14 day antibiotic course, OP cystoscopy. -Pending susceptibilities  -Zosyn (1/24-1/27), switch to Cefepime today and discharge with 10 day antibiotic course -Keep foley in place

## 2023-05-05 DIAGNOSIS — N186 End stage renal disease: Secondary | ICD-10-CM | POA: Diagnosis not present

## 2023-05-05 LAB — CBC
HCT: 21.5 % — ABNORMAL LOW (ref 39.0–52.0)
Hemoglobin: 7 g/dL — ABNORMAL LOW (ref 13.0–17.0)
MCH: 30.7 pg (ref 26.0–34.0)
MCHC: 32.6 g/dL (ref 30.0–36.0)
MCV: 94.3 fL (ref 80.0–100.0)
Platelets: 147 10*3/uL — ABNORMAL LOW (ref 150–400)
RBC: 2.28 MIL/uL — ABNORMAL LOW (ref 4.22–5.81)
RDW: 13 % (ref 11.5–15.5)
WBC: 7 10*3/uL (ref 4.0–10.5)
nRBC: 0 % (ref 0.0–0.2)

## 2023-05-05 LAB — GLUCOSE, CAPILLARY
Glucose-Capillary: 88 mg/dL (ref 70–99)
Glucose-Capillary: 97 mg/dL (ref 70–99)
Glucose-Capillary: 146 mg/dL — ABNORMAL HIGH (ref 70–99)
Glucose-Capillary: 190 mg/dL — ABNORMAL HIGH (ref 70–99)
Glucose-Capillary: 191 mg/dL — ABNORMAL HIGH (ref 70–99)

## 2023-05-05 LAB — RENAL FUNCTION PANEL
Albumin: 2.1 g/dL — ABNORMAL LOW (ref 3.5–5.0)
Anion gap: 11 (ref 5–15)
BUN: 36 mg/dL — ABNORMAL HIGH (ref 8–23)
CO2: 23 mmol/L (ref 22–32)
Calcium: 7.1 mg/dL — ABNORMAL LOW (ref 8.9–10.3)
Chloride: 96 mmol/L — ABNORMAL LOW (ref 98–111)
Creatinine, Ser: 8.81 mg/dL — ABNORMAL HIGH (ref 0.61–1.24)
GFR, Estimated: 6 mL/min — ABNORMAL LOW (ref >60)
Glucose, Bld: 93 mg/dL (ref 70–99)
Phosphorus: 2.6 mg/dL (ref 2.5–4.6)
Potassium: 3.4 mmol/L — ABNORMAL LOW (ref 3.5–5.1)
Sodium: 130 mmol/L — ABNORMAL LOW (ref 135–145)

## 2023-05-05 LAB — MAGNESIUM: Magnesium: 1.8 mg/dL (ref 1.7–2.4)

## 2023-05-05 MED ORDER — ALBUMIN HUMAN 25 % IV SOLN
25.0000 g | Freq: Once | INTRAVENOUS | Status: AC
  Administered 2023-05-05: 25 g via INTRAVENOUS
  Filled 2023-05-05: qty 100

## 2023-05-05 NOTE — Progress Notes (Signed)
Daily Progress Note Intern Pager: [phone]  Patient name: Louis Butler Medical record number: [id number] Date of birth: [birth year] Age: 68 y.o. 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 Will receive HD today, CLIP process in place Received outpatient dialysis assignment, will plan for patient to start that on Friday and will prepare for him to discharge home hopefully tomorrow Assessment & Plan ESRD (end stage renal disease) (HCC) Encephalopathy and electrolyte abnormality resolved with dialysis. Currently undergoing CLIP process, will continue TTS Dialysis outpatient in [facility] -Nephrology following, appreciate recommendations -Continue midodrine 10mg  TID and with dialysis Hyperkalemia K 3.4 this morning, will receive HD this morning -am RFP Urinary tract infection  Bilateral Hydronephrosis Pseudomonal UTI, with evidence of bilateral hydronephrosis c/f outlet obstruction. Urology has seen, recommend keep foley in place until OP f/u, and 14 day antibiotic course, OP cystoscopy. -Pending susceptibilities  -Zosyn (1/24-1/27), switched to Cefepime yesterday and discharge with 10 day antibiotic course -Keep foley in place Anemia Hgb stable 7.7 >7.8>7.0. Normocytic. Suspect anemia of chronic disease. Iron studies including ferritin of 648, makes iron deficiency less likely. C/f iron overload- may be related to infection, inflammatory state or malignancy. Liver function studies WNL, lower concern for hereditary hemochromatosis. -Continue to monitor -Recommend OP follow-up Type 2 diabetes  mellitus with diabetic chronic kidney disease (HCC) A1C of 6.8. On 6U lantus at home. Well controlled during admission.  -CBGs -very sensitive SSI Depressed mood Depressed mood recently. 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 CT temporal bones showing post-operative changes without new masses or acute findings to explain L ear pain.  -[facility] Ulcer of sacral region, stage 2 (HCC) -WOC, frequent turns -RD recs   Chronic and Stable Problems:  HTN- holding BP medications 2/2 soft pressures, resume as tolerated GERD-Protonix 40 mg daily   FEN/GI: regular diet PPx: heparin Dispo:Home Barriers include CLIP process.   Subjective:  No acute events overnight.  Receiving HD my evaluation.  No concerns or complaints  Objective: Temp:  [97.6 F (36.4 C)-98.3 F (36.8 C)] 97.6 F (36.4 C) (01/28 0737) Pulse Rate:  [62-88] 69 (01/28 0700) Resp:  [12-17] 13 (01/28 0700) BP: (99-133)/(46-74) 106/51 (01/28 0700) SpO2:  [94 %-100 %] 97 % (01/28 0700) Weight:  [79.9 kg] 79.9 kg (01/28 0500) Physical Exam: General: chronically ill appearing, NAD, receiving HD Cardiovascular: RRR, no m/r/g Respiratory: normal work of breathing on RA, CTAB Abdomen: Normal bowel sounds, soft, non-tender Extremities: No swelling BLE  Laboratory: Most recent CBC Lab Results  Component Value Date   WBC 7.0 05/05/2023   HGB 7.0 (L) 05/05/2023   HCT 21.5 (L) 05/05/2023   MCV 94.3 05/05/2023   PLT 147 (L) 05/05/2023   Most recent BMP    Latest Ref Rng & Units 05/05/2023    5:48 AM  BMP  Glucose 70 - 99 mg/dL 93   BUN 8 - 23 mg/dL 36   Creatinine 0.27 - 1.24 mg/dL  8.81   Sodium 135 - 145 mmol/L 130   Potassium 3.5 - 5.1 mmol/L 3.4   Chloride 98 - 111 mmol/L 96   CO2 22 - 32 mmol/L 23   Calcium 8.9 - 10.3 mg/dL 7.1      Ladarrell Cornwall, DO 05/05/2023, 7:51 AM  PGY-1, [facility] Family Medicine FPTS Intern pager: [phone], text pages welcome Secure chat group [facility] Teaching Service

## 2023-05-05 NOTE — Progress Notes (Signed)
Physical Therapy Treatment Patient Details Name: Louis Butler MRN: [id number] DOB: 24-Nov-1955 Today's Date: 05/05/2023   History of Present Illness 68 y.o. male presented 1/22 with N/V/abd pain and was found to have marked hyperkalemia and renal failure. CRRT 1/23-1/24. 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 Ricard Dillon), DM, HTN, recent squamous cell ca H&N (surgery, XRT 2024), anemia.    PT Comments  Slow progress but demonstrating improved independence and safety awareness with mobility. Still limited by symptomatic orthostatic hypotension when standing/walking (see below.) Standing time limited <1 minute. Extensive education on symptom awareness, limitations with standing/walking, and modifying tasks to avoid syncopal episode/fall. Pt still eager to go home and feels he has adequate supervision with all necessary DME. Will use w/c for mobility, stand with RW, and have supervision with any short distance gait in home. If medically, team feels this should be managed at a skilled facility, I can certainly agree that rehab efforts by PT/OT could be continued in that setting if deemed safest disposition. Currently he can manage transfers without physical assistance. Will continue to progress as tolerated. Medical team updated. Also suggested possible use of compression stockings and abdominal binder when mobilizing out of bed to see if that helps symptoms and tolerance with mobility.      05/05/23 1508  Orthostatic Lying   BP- Lying 129/52  Pulse- Lying 86  Orthostatic Sitting  BP- Sitting 105/58  Pulse- Sitting 103  Orthostatic Standing at 0 minutes  BP- Standing at 0 minutes (!) 79/47  Pulse- Standing at 0 minutes 113      If plan is discharge home, recommend the following: A little help with walking and/or transfers;A little help with bathing/dressing/bathroom;Assistance with cooking/housework;Assist for transportation;Help with stairs or ramp for entrance   Can travel by private vehicle         Equipment Recommendations  None recommended by PT    Recommendations for Other Services       Precautions / Restrictions Precautions Precautions: Fall Precaution Comments: Monitor BP. Orthostatic Restrictions Weight Bearing Restrictions Per Provider Order: No     Mobility  Bed Mobility Overal bed mobility: Needs Assistance Bed Mobility: Supine to Sit     Supine to sit: Supervision     General bed mobility comments: Supervision for safety, cues for technique, no assist needed.    Transfers Overall transfer level: Needs assistance Equipment used: Rolling walker (2 wheels) Transfers: Sit to/from Stand Sit to Stand: Supervision           General transfer comment: Supervision for safety, slow to rise, a little effortful but without physical assistance today. Becomes dizzy and cannot tolerate standing >1 minute (see orthostatics.)    Ambulation/Gait Ambulation/Gait assistance: Contact guard assist Gait Distance (Feet): 12 Feet (x2) Assistive device: Rolling walker (2 wheels) Gait Pattern/deviations: Step-through pattern, Decreased stride length, Trunk flexed Gait velocity: dec Gait velocity interpretation: <1.31 ft/sec, indicative of household ambulator   General Gait Details: Educated on symptom awareness, short distances only due to known drop in BP with standing. Able to navigate to and from bathroom, symptoms improve upon sitting but BP remains low. CGA for safety, but no overt LOB or buckling while using RW for support.   Stairs             Wheelchair Mobility     Tilt Bed    Modified Rankin (Stroke Patients Only)       Balance Overall balance assessment: Needs assistance Sitting-balance support: No upper extremity supported, Feet supported Sitting balance-Leahy  Scale: Good     Standing balance support: Bilateral upper extremity supported, Reliant on assistive device for balance Standing balance-Leahy Scale: Poor                               Cognition Arousal: Alert Behavior During Therapy: WFL for tasks assessed/performed Overall Cognitive Status: Within Functional Limits for tasks assessed                                          Exercises      General Comments General comments (skin integrity, edema, etc.): See orthostatics. Pt symptomatic with standing. HR 86 at rest, SpO2 97% on RA, RR 19. BP end of session reclined 45 deg with LEs elevated 108/59 HR 83.      Pertinent Vitals/Pain Pain Assessment Pain Assessment: Faces Faces Pain Scale: Hurts little more Pain Location: neck, reports chronic issue Pain Descriptors / Indicators: Aching Pain Intervention(s): Monitored during session, Repositioned    Home Living                          Prior Function            PT Goals (current goals can now be found in the care plan section) Acute Rehab PT Goals Patient Stated Goal: Get well, go home. PT Goal Formulation: With patient Time For Goal Achievement: 05/22/23 Potential to Achieve Goals: Fair Progress towards PT goals: Progressing toward goals    Frequency    Min 1X/week      PT Plan      Co-evaluation              AM-PAC PT "6 Clicks" Mobility   Outcome Measure  Help needed turning from your back to your side while in a flat bed without using bedrails?: None Help needed moving from lying on your back to sitting on the side of a flat bed without using bedrails?: None Help needed moving to and from a bed to a chair (including a wheelchair)?: A Little Help needed standing up from a chair using your arms (e.g., wheelchair or bedside chair)?: A Little Help needed to walk in hospital room?: A Little Help needed climbing 3-5 steps with a railing? : Total 6 Click Score: 18    End of Session Equipment Utilized During Treatment: Gait belt Activity Tolerance: Treatment limited secondary to medical complications (Comment) (Drop in BP, bowel urgency) Patient  left: in chair;with call bell/phone within reach;with chair alarm set;with family/visitor present Nurse Communication: Mobility status (BP orthostatics) PT Visit Diagnosis: Unsteadiness on feet (R26.81);Muscle weakness (generalized) (M62.81);Difficulty in walking, not elsewhere classified (R26.2);Dizziness and giddiness (R42)     Time: 4098-1191 PT Time Calculation (min) (ACUTE ONLY): 33 min  Charges:    $Gait Training: 8-22 mins $Therapeutic Activity: 8-22 mins PT General Charges $$ ACUTE PT VISIT: 1 Visit                     Kathlyn Sacramento, PT, DPT [facility]  Rehabilitation Services Physical Therapist Office: [phone] Website: [email or web address]    Berton Mount 05/05/2023, 4:03 PM

## 2023-05-05 NOTE — Progress Notes (Addendum)
Pt has been accepted at [facility] on MWF 12:25 pm chair time. Pt can start tomorrow and will need to arrive at 11:30 am to complete paperwork prior to treatment. Left voicemail for pt's wife requesting a return call. [name] RN to also alert navigator when pt's wife arrives back at hospital. Update provided to attending, RN CM, and pt's RN. Also discussed with nephrologist. Pt is for d/c later today with plans for pt to start at clinic tomorrow. Contacted renal PA to request that orders be sent to clinic for tomorrow. Contacted clinic to make staff aware that pt should d/c today and start tomorrow and that renal PA to send orders. Arrangements added to AVS as well. Will assist as needed.   Olivia Canter Renal Navigator [phone]  Addendum at 2:11 pm: Spoke to pt's wife via phone. Discussed out-pt HD arrangements over the phone. Wife agreeable to plans and advised navigator to provide schedule letter to pt's son at pt's bedside. Met with pt and pt's son at bedside. Discussed out-pt details and provided information to pt's son per wife's request. Wife voiced concerns to navigator regarding pt being d/c today. Concerns passed along to attending. Plan will be for pt to start at clinic on Friday. Contacted clinic and renal PA to be made aware of plans to start on Friday.

## 2023-05-05 NOTE — Assessment & Plan Note (Addendum)
Depressed mood recently. Sertraline 25mg  started while inpatient - continue to monitor

## 2023-05-05 NOTE — Assessment & Plan Note (Addendum)
Pseudomonal UTI, with evidence of bilateral hydronephrosis c/f outlet obstruction. Urology has seen, recommend keep foley in place until OP f/u, and 14 day antibiotic course, OP cystoscopy. -Pending susceptibilities  -Zosyn (1/24-1/27), switched to Cefepime yesterday and discharge with 10 day antibiotic course -Keep foley in place

## 2023-05-05 NOTE — Procedures (Signed)
I was present at this dialysis session. I have reviewed the session itself and made appropriate changes.   Vital signs in last 24 hours:  Temp:  [97.6 F (36.4 C)-98.3 F (36.8 C)] 97.6 F (36.4 C) (01/28 0737) Pulse Rate:  [62-88] 78 (01/28 0858) Resp:  [9-18] 15 (01/28 0858) BP: (97-138)/(46-74) 97/50 (01/28 0830) SpO2:  [94 %-100 %] 99 % (01/28 0858) Weight:  [79.9 kg] 79.9 kg (01/28 0500) Weight change:  Filed Weights   05/03/23 0104 05/03/23 0435 05/05/23 0500  Weight: 79.5 kg 78.6 kg 79.9 kg    Recent Labs  Lab 05/05/23 0548  NA 130*  K 3.4*  CL 96*  CO2 23  GLUCOSE 93  BUN 36*  CREATININE 8.81*  CALCIUM 7.1*  PHOS 2.6    Recent Labs  Lab 05/03/23 0229 05/04/23 0329 05/05/23 0548  WBC 7.8 8.3 7.0  HGB 7.8* 7.0* 7.0*  HCT 23.3* 22.0* 21.5*  MCV 91.4 94.4 94.3  PLT 137* 140* 147*    Scheduled Meds:  Chlorhexidine Gluconate Cloth  6 each Topical Q0600   cholecalciferol  5,000 Units Oral Once per day on Monday Thursday   darbepoetin (ARANESP) injection - DIALYSIS  60 mcg Subcutaneous Q Mon-1800   feeding supplement  1 Container Oral BID BM   gabapentin  300 mg Oral QHS   heparin injection (subcutaneous)  5,000 Units Subcutaneous Q8H   insulin aspart  0-6 Units Subcutaneous TID WC   midodrine  10 mg Oral TID WC   multivitamin  1 tablet Oral QHS   pantoprazole  40 mg Oral Daily   sertraline  25 mg Oral Daily   tamsulosin  0.4 mg Oral Daily   Continuous Infusions:  ceFEPime (MAXIPIME) IV     PRN Meds:.acetaminophen, docusate sodium, fentaNYL (SUBLIMAZE) injection, hydrOXYzine, loperamide, midodrine, mouth rinse, polyethylene glycol   Irena Cords,  MD 05/05/2023, 9:00 AM

## 2023-05-05 NOTE — Assessment & Plan Note (Addendum)
S/p L neck dissection, L temporal bone resection and L ear canal dissection/flap in 10/2022. CT temporal bones showing post-operative changes without new masses or acute findings to explain L ear pain.  -[facility] with [facility]

## 2023-05-05 NOTE — Assessment & Plan Note (Addendum)
-  WOC, frequent turns -RD recs

## 2023-05-05 NOTE — Assessment & Plan Note (Addendum)
Encephalopathy and electrolyte abnormality resolved with dialysis. Currently undergoing CLIP process, will continue TTS Dialysis outpatient in [facility] -Nephrology following, appreciate recommendations -Continue midodrine 10mg  TID and with dialysis

## 2023-05-05 NOTE — Assessment & Plan Note (Addendum)
K 3.4 this morning, will receive HD this morning -am RFP

## 2023-05-05 NOTE — Assessment & Plan Note (Addendum)
A1C of 6.8. On 6U lantus at home. Well controlled during admission.  -CBGs -very sensitive SSI

## 2023-05-05 NOTE — Assessment & Plan Note (Addendum)
Hgb stable 7.7 >7.8>7.0. Normocytic. Suspect anemia of chronic disease. Iron studies including ferritin of 648, makes iron deficiency less likely. C/f iron overload- may be related to infection, inflammatory state or malignancy. Liver function studies WNL, lower concern for hereditary hemochromatosis. -Continue to monitor -Recommend OP follow-up

## 2023-05-05 NOTE — Procedures (Signed)
HD Note:  Some information was entered later than the data was gathered due to patient care needs. The stated time with the data is accurate.  Patient treatment performed at bedside.  Alert and oriented.   Informed consent signed and in chart.   Access used: Left upper arm fistula Access issues: None  Patient had hypotensive issues with complaint of dizziness during treatment where UF had to be paused, patient placed in the trendelenburg position, bolus of 100 ml given, and Albumin given.  See MAR and flowsheet for details. Patient O2 sat went into the high 80s and O2 at 2l/m was initiated.  See flowsheet. Dr. Eulogio Ditch notified of patient response to treatment.  UF goal reduced to a total of 500 ml.  TX duration: 3.5 hours  Alert, without acute distress.  Total UF removed: 500 ml  Hand-off given to patient's nurse.     Louis Simoni L. Dareen Piano, RN Kidney Dialysis Unit.

## 2023-05-06 DIAGNOSIS — N186 End stage renal disease: Secondary | ICD-10-CM | POA: Diagnosis not present

## 2023-05-06 LAB — HEMOGLOBIN AND HEMATOCRIT, BLOOD
HCT: 25.8 % — ABNORMAL LOW (ref 39.0–52.0)
Hemoglobin: 8.1 g/dL — ABNORMAL LOW (ref 13.0–17.0)

## 2023-05-06 LAB — RENAL FUNCTION PANEL
Albumin: 2.3 g/dL — ABNORMAL LOW (ref 3.5–5.0)
Anion gap: 10 (ref 5–15)
BUN: 23 mg/dL (ref 8–23)
CO2: 26 mmol/L (ref 22–32)
Calcium: 7.2 mg/dL — ABNORMAL LOW (ref 8.9–10.3)
Chloride: 94 mmol/L — ABNORMAL LOW (ref 98–111)
Creatinine, Ser: 6.22 mg/dL — ABNORMAL HIGH (ref 0.61–1.24)
GFR, Estimated: 9 mL/min — ABNORMAL LOW (ref >60)
Glucose, Bld: 106 mg/dL — ABNORMAL HIGH (ref 70–99)
Phosphorus: 1.7 mg/dL — ABNORMAL LOW (ref 2.5–4.6)
Potassium: 3.2 mmol/L — ABNORMAL LOW (ref 3.5–5.1)
Sodium: 130 mmol/L — ABNORMAL LOW (ref 135–145)

## 2023-05-06 LAB — CBC
HCT: 21.4 % — ABNORMAL LOW (ref 39.0–52.0)
Hemoglobin: 6.7 g/dL — CL (ref 13.0–17.0)
MCH: 29.9 pg (ref 26.0–34.0)
MCHC: 31.3 g/dL (ref 30.0–36.0)
MCV: 95.5 fL (ref 80.0–100.0)
Platelets: 146 10*3/uL — ABNORMAL LOW (ref 150–400)
RBC: 2.24 MIL/uL — ABNORMAL LOW (ref 4.22–5.81)
RDW: 13.1 % (ref 11.5–15.5)
WBC: 5.6 10*3/uL (ref 4.0–10.5)
nRBC: 0 % (ref 0.0–0.2)

## 2023-05-06 LAB — GLUCOSE, CAPILLARY
Glucose-Capillary: 101 mg/dL — ABNORMAL HIGH (ref 70–99)
Glucose-Capillary: 222 mg/dL — ABNORMAL HIGH (ref 70–99)
Glucose-Capillary: 231 mg/dL — ABNORMAL HIGH (ref 70–99)
Glucose-Capillary: 142 mg/dL — ABNORMAL HIGH (ref 70–99)

## 2023-05-06 LAB — MAGNESIUM: Magnesium: 1.7 mg/dL (ref 1.7–2.4)

## 2023-05-06 LAB — PREPARE RBC (CROSSMATCH)

## 2023-05-06 MED ORDER — HEPARIN SODIUM (PORCINE) 5000 UNIT/ML IJ SOLN
5000.0000 [IU] | Freq: Three times a day (TID) | INTRAMUSCULAR | Status: DC
  Administered 2023-05-06 – 2023-06-12 (×104): 5000 [IU] via SUBCUTANEOUS
  Filled 2023-05-06 (×100): qty 1

## 2023-05-06 MED ORDER — ACETAMINOPHEN 500 MG PO TABS
1000.0000 mg | ORAL_TABLET | Freq: Four times a day (QID) | ORAL | Status: DC
  Administered 2023-05-06 – 2023-05-15 (×29): 1000 mg via ORAL
  Filled 2023-05-06 (×31): qty 2

## 2023-05-06 MED ORDER — SODIUM CHLORIDE 0.9% IV SOLUTION: Freq: Once | INTRAVENOUS | Status: DC

## 2023-05-06 NOTE — Progress Notes (Signed)
Daily Progress Note Intern Pager: [phone]  Patient name: Louis Butler Medical record number: [id number] Date of birth: June 24, 1955 Age: 68 y.o. Gender: male  Primary Care Provider: Latrelle Dodrill, MD Consultants: CCM (signed off), nephrology  Code Status: Full   Pt Overview and Major Events to Date:  1/22: Admited to ICU, restarted CRRT 1/26: FMTS resumed care   Assessment and Plan:  68yo male with PMHx CKD5 now ESRD on dialysis, DM, HTN, SCC H&N, anemia who presented with N/V abdominal pain and confusion, found to have renal failure and significant hyperkalemia.  Wife and pt amenable to SNF placement after discharge due to generalized weakness, TOC consult placed for assistance with SNF planning.  Orthostatic hypotension during PT yesterday, will order abdominal binder for today Assessment & Plan ESRD (end stage renal disease) (HCC) Encephalopathy and electrolyte abnormality resolved with dialysis. Currently undergoing CLIP process, will continue TTS Dialysis outpatient in [facility] -Nephrology following, appreciate recommendations -Continue midodrine 10mg  TID and with dialysis Hyperkalemia K 3.2 this morning -nephrology following -am RFP Urinary tract infection  Bilateral Hydronephrosis Pseudomonal UTI, with evidence of bilateral hydronephrosis c/f outlet obstruction. Urology has seen, recommend keep foley in place until OP f/u, and 14 day antibiotic course, OP cystoscopy. -Zosyn (1/24-1/27), switched to Cefepime yesterday and discharge with 10 day antibiotic course -Keep foley in place Anemia Hgb 7.0>6.7 this morning. Probably due to frequent lab draws in addition to anemia of chronic disease. Iron studies including ferritin of 648, makes iron deficiency less likely. C/f iron overload- may be related to infection, inflammatory state or malignancy. Liver function studies WNL, lower concern for hereditary hemochromatosis. - 1U PRBC  - post transfusion  H/H -Continue to monitor -Recommend OP follow-up Type 2 diabetes mellitus with diabetic chronic kidney disease (HCC) A1C of 6.8. On 6U lantus at home. Well controlled during admission.  -CBGs -very sensitive SSI Depressed mood Depressed mood recently. 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 CT temporal bones showing post-operative changes without new masses or acute findings to explain L ear pain.  -[facility] -Scheduled tylenol 1000mg  q6h for pain Ulcer of sacral region, stage 2 (HCC) -WOC, frequent turns -RD recs    Chronic and Stable Problems:  HTN- holding BP medications 2/2 soft pressures, resume as tolerated GERD-Protonix 40 mg daily   FEN/GI: regular diet PPx: heparin Dispo:SNF pending clinical improvement .   Subjective:  No acute events overnight.  Wife states he has been intermittently complaining of neck pain she thinks is related to his cancer.  She also expresses concern that he is too weak to be discharged soon.  Objective: Temp:  [98 F (36.7 C)-98.7 F (37.1 C)] 98.7 F (37.1 C) (01/29 0744) Pulse Rate:  [69-95] 78 (01/29 0500) Resp:  [10-20] 17 (01/29 0500) BP: (90-144)/(33-121) 90/33 (01/29 0400) SpO2:  [93 %-100 %] 97 % (01/29 0500) Weight:  [79.4 kg] 79.4 kg (01/28 1242) Physical Exam: General: chronically ill appearing, NAD Cardiovascular: well perfused Respiratory: normal work of breathing on RA Extremities: No swelling BLE  Laboratory: Most recent CBC Lab Results  Component Value Date   WBC 5.6 05/06/2023   HGB 6.7 (LL) 05/06/2023   HCT 21.4 (L) 05/06/2023   MCV 95.5 05/06/2023   PLT 146 (L) 05/06/2023   Most recent BMP    Latest Ref  Rng & Units 05/06/2023    2:50 AM  BMP  Glucose 70 - 99 mg/dL 098   BUN 8 - 23 mg/dL 23   Creatinine 1.19 - 1.24 mg/dL  1.47   Sodium 829 - 562 mmol/L 130   Potassium 3.5 - 5.1 mmol/L 3.2   Chloride 98 - 111 mmol/L 94   CO2 22 - 32 mmol/L 26   Calcium 8.9 - 10.3 mg/dL 7.2     Stefania Goulart, DO 05/06/2023, 11:35 AM  PGY-1, [place] [facility] FPTS Intern pager: [phone], text pages welcome Secure chat group [facility] Teaching Service

## 2023-05-06 NOTE — Assessment & Plan Note (Addendum)
K 3.2 this morning -nephrology following -am RFP

## 2023-05-06 NOTE — Progress Notes (Signed)
OT Cancellation Note  Patient Details Name: Louis Butler MRN: [id number] DOB: 02-25-1956   Cancelled Treatment:    Reason Eval/Treat Not Completed: Medical issues which prohibited therapy (Pt eating and then with plan for blood transfusion (hgb 6.9).)  Evern Bio 05/06/2023, 12:37 PM Berna Spare, OTR/L Acute Rehabilitation Services Office: [phone]

## 2023-05-06 NOTE — Assessment & Plan Note (Addendum)
S/p L neck dissection, L temporal bone resection and L ear canal dissection/flap in 10/2022. CT temporal bones showing post-operative changes without new masses or acute findings to explain L ear pain.  -[facility] -Scheduled tylenol 1000mg  q6h for pain

## 2023-05-06 NOTE — Progress Notes (Signed)
Patient ID: [name], male   DOB: [birth year], 68 y.o.   MRN: [id number] S: Was not discharged yesterday as his wife needs to have an hospital bed delivered.  Also, Hgb down to 6.7 and is receiving blood this morning.  O:BP (!) 90/33 (BP Location: Right Arm)   Pulse 78   Temp 98.7 F (37.1 C) (Oral)   Resp 17   Ht 6\' 3"  (1.905 m)   Wt 79.4 kg   SpO2 97%   BMI 21.88 kg/m   Intake/Output Summary (Last 24 hours) at 05/06/2023 0945 Last data filed at 05/06/2023 0600 Gross per 24 hour  Intake 186.67 ml  Output 1145 ml  Net -958.33 ml   Intake/Output: I/O last 3 completed shifts: In: 546.7 [P.O.:480; IV Piggyback:66.7] Out: 1445 [Urine:945; Other:500]  Intake/Output this shift:  No intake/output data recorded. Weight change: 0 kg Gen: NAD CVS: RRR Resp: CTA Abd: +BS, soft, NT/ND Ext: no edema, LUE AVF +T/B.   Recent Labs  Lab 04/30/23 2052 04/30/23 2224 05/01/23 0443 05/01/23 1623 05/02/23 0423 05/03/23 0229 05/04/23 0329 05/05/23 0548 05/06/23 0250  NA  --    < > 134* 133* 133* 134* 132* 130* 130*  K  --    < > 4.6 4.3 4.2 3.4* 3.3* 3.4* 3.2*  CL  --    < > 101 99 99 98 98 96* 94*  CO2  --    < > 21* 20* 23 24 23 23 26   GLUCOSE  --    < > 144* 154* 104* 103* 96 93 106*  BUN  --    < > 58* 44* 50* 35* 31* 36* 23  CREATININE  --    < > 7.13* 6.28* 7.33* 5.09* 6.74* 8.81* 6.22*  ALBUMIN 2.4*   < > 2.5* 2.4* 2.3* 2.3* 2.1* 2.1* 2.3*  CALCIUM  --    < > 7.5* 7.5* 7.3* 7.6* 7.1* 7.1* 7.2*  PHOS  --    < > 3.5 3.0 3.7 2.1* 2.8 2.6 1.7*  AST 15  --   --   --   --   --   --   --   --   ALT 11  --   --   --   --   --   --   --   --    < > = values in this interval not displayed.   Liver Function Tests: Recent Labs  Lab 04/30/23 2052 04/30/23 2224 05/04/23 0329 05/05/23 0548 05/06/23 0250  AST 15  --   --   --   --   ALT 11  --   --   --   --   ALKPHOS 47  --   --   --   --   BILITOT 1.2  --   --   --   --   PROT 7.0  --   --   --   --   ALBUMIN 2.4*   < > 2.1*  2.1* 2.3*   < > = values in this interval not displayed.   No results for input(s): "LIPASE", "AMYLASE" in the last 168 hours. Recent Labs  Lab 04/30/23 0955  AMMONIA 17   CBC: Recent Labs  Lab 05/02/23 0423 05/03/23 0229 05/04/23 0329 05/05/23 0548 05/06/23 0250  WBC 7.3 7.8 8.3 7.0 5.6  HGB 7.7* 7.8* 7.0* 7.0* 6.7*  HCT 23.4* 23.3* 22.0* 21.5* 21.4*  MCV 92.1 91.4 94.4 94.3 95.5  PLT 127* 137*  140* 147* 146*   Cardiac Enzymes: No results for input(s): "CKTOTAL", "CKMB", "CKMBINDEX", "TROPONINI" in the last 168 hours. CBG: Recent Labs  Lab 05/05/23 0735 05/05/23 1155 05/05/23 1603 05/05/23 2132 05/06/23 0742  GLUCAP 97 146* 190* 191* 101*    Iron Studies: No results for input(s): "IRON", "TIBC", "TRANSFERRIN", "FERRITIN" in the last 72 hours. Studies/Results: No results found.  sodium chloride   Intravenous Once   Chlorhexidine Gluconate Cloth  6 each Topical Q0600   cholecalciferol  5,000 Units Oral Once per day on Monday Thursday   darbepoetin (ARANESP) injection - DIALYSIS  60 mcg Subcutaneous Q Mon-1800   feeding supplement  1 Container Oral BID BM   gabapentin  300 mg Oral QHS   heparin injection (subcutaneous)  5,000 Units Subcutaneous Q8H   insulin aspart  0-6 Units Subcutaneous TID WC   midodrine  10 mg Oral TID WC   multivitamin  1 tablet Oral QHS   pantoprazole  40 mg Oral Daily   sertraline  25 mg Oral Daily   tamsulosin  0.4 mg Oral Daily    BMET    Component Value Date/Time   NA 130 (L) 05/06/2023 0250   NA 133 (L) 06/10/2022 1528   K 3.2 (L) 05/06/2023 0250   CL 94 (L) 05/06/2023 0250   CO2 26 05/06/2023 0250   GLUCOSE 106 (H) 05/06/2023 0250   BUN 23 05/06/2023 0250   BUN 53 (H) 06/10/2022 1528   CREATININE 6.22 (H) 05/06/2023 0250   CALCIUM 7.2 (L) 05/06/2023 0250   CALCIUM 6.5 (LL) 04/30/2023 0410   GFRNONAA 9 (L) 05/06/2023 0250   GFRAA 15 (L) 08/09/2019 0523   CBC    Component Value Date/Time   WBC 5.6 05/06/2023 0250    RBC 2.24 (L) 05/06/2023 0250   HGB 6.7 (LL) 05/06/2023 0250   HGB 10.7 (L) 06/02/2022 1217   HCT 21.4 (L) 05/06/2023 0250   HCT 32.2 (L) 06/02/2022 1217   PLT 146 (L) 05/06/2023 0250   PLT 159 06/02/2022 1217   MCV 95.5 05/06/2023 0250   MCV 99 (H) 06/02/2022 1217   MCH 29.9 05/06/2023 0250   MCHC 31.3 05/06/2023 0250   RDW 13.1 05/06/2023 0250   RDW 13.0 06/02/2022 1217   LYMPHSABS 1.6 06/02/2022 1217   MONOABS 0.7 04/08/2019 1621   EOSABS 0.3 06/02/2022 1217   BASOSABS 0.1 06/02/2022 1217    Louis Butler is an 68 y.o. male with CKD 5 (f/b Louis Butler), DM, HTN, recent squamous cell ca H&N (surgery, XRT 2024), anemia who presented with N/V/abd pain and was found to have marked hyperkalemia and renal failure for which nephrology is consulted.  Was on HD 2021-06/2020 when he self discontinued and has followed in clinic since.  Last OV with Louis Butler was 02/19/24 - GFR11, Hb 9.6.  Pt unclear if he would be accepting of long term dialysis    Assessment/Plan:   New ESRD - tolerating IHD and had his second HD session yesterday.  Will plan for third tomorrow.  CLIP process started and he wants [facility] only because he wants to be with Louis Butler unit.  Hold off on HD until this Friday to get on outpatient schedule either as an inpatient if still here or as an outpatient.  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   Getting blood today.  Obstructive uropathy - now with foley  Disposition - awaiting  for equipment delivery to house.  He is set up with outpatient HD arrangements at [facility] on MWF schedule.    Vascular access - using LUE AVF without issues.  Louis Cords, MD [employer] [phone]

## 2023-05-06 NOTE — Assessment & Plan Note (Addendum)
-  WOC, frequent turns -RD recs

## 2023-05-06 NOTE — Assessment & Plan Note (Addendum)
Hgb 7.0>6.7 this morning. Probably due to frequent lab draws in addition to anemia of chronic disease. Iron studies including ferritin of 648, makes iron deficiency less likely. C/f iron overload- may be related to infection, inflammatory state or malignancy. Liver function studies WNL, lower concern for hereditary hemochromatosis. - 1U PRBC  - post transfusion H/H -Continue to monitor -Recommend OP follow-up

## 2023-05-06 NOTE — Assessment & Plan Note (Addendum)
Encephalopathy and electrolyte abnormality resolved with dialysis. Currently undergoing CLIP process, will continue TTS Dialysis outpatient in [facility] -Nephrology following, appreciate recommendations -Continue midodrine 10mg  TID and with dialysis

## 2023-05-06 NOTE — Assessment & Plan Note (Addendum)
Pseudomonal UTI, with evidence of bilateral hydronephrosis c/f outlet obstruction. Urology has seen, recommend keep foley in place until OP f/u, and 14 day antibiotic course, OP cystoscopy. -Zosyn (1/24-1/27), switched to Cefepime yesterday and discharge with 10 day antibiotic course -Keep foley in place

## 2023-05-06 NOTE — Assessment & Plan Note (Addendum)
A1C of 6.8. On 6U lantus at home. Well controlled during admission.  -CBGs -very sensitive SSI

## 2023-05-06 NOTE — Assessment & Plan Note (Addendum)
Depressed mood recently. Sertraline 25mg  started while inpatient - continue to monitor

## 2023-05-07 ENCOUNTER — Inpatient Hospital Stay (HOSPITAL_COMMUNITY): Payer: Medicare Other

## 2023-05-07 ENCOUNTER — Encounter (HOSPITAL_COMMUNITY): Payer: Self-pay | Admitting: Pulmonary Disease

## 2023-05-07 DIAGNOSIS — I951 Orthostatic hypotension: Secondary | ICD-10-CM | POA: Diagnosis present

## 2023-05-07 DIAGNOSIS — C44201 Unspecified malignant neoplasm of skin of unspecified ear and external auricular canal: Secondary | ICD-10-CM

## 2023-05-07 DIAGNOSIS — N179 Acute kidney failure, unspecified: Secondary | ICD-10-CM | POA: Diagnosis not present

## 2023-05-07 DIAGNOSIS — N189 Chronic kidney disease, unspecified: Secondary | ICD-10-CM | POA: Diagnosis not present

## 2023-05-07 LAB — CBC
HCT: 26.4 % — ABNORMAL LOW (ref 39.0–52.0)
Hemoglobin: 8.4 g/dL — ABNORMAL LOW (ref 13.0–17.0)
MCH: 30.2 pg (ref 26.0–34.0)
MCHC: 31.8 g/dL (ref 30.0–36.0)
MCV: 95 fL (ref 80.0–100.0)
Platelets: 161 10*3/uL (ref 150–400)
RBC: 2.78 MIL/uL — ABNORMAL LOW (ref 4.22–5.81)
RDW: 14.3 % (ref 11.5–15.5)
WBC: 4.9 10*3/uL (ref 4.0–10.5)
nRBC: 0 % (ref 0.0–0.2)

## 2023-05-07 LAB — TYPE AND SCREEN
ABO/RH(D): O POS
Antibody Screen: NEGATIVE
Unit division: 0

## 2023-05-07 LAB — RENAL FUNCTION PANEL
Albumin: 2.3 g/dL — ABNORMAL LOW (ref 3.5–5.0)
Anion gap: 10 (ref 5–15)
BUN: 29 mg/dL — ABNORMAL HIGH (ref 8–23)
CO2: 24 mmol/L (ref 22–32)
Calcium: 7.4 mg/dL — ABNORMAL LOW (ref 8.9–10.3)
Chloride: 96 mmol/L — ABNORMAL LOW (ref 98–111)
Creatinine, Ser: 7.69 mg/dL — ABNORMAL HIGH (ref 0.61–1.24)
GFR, Estimated: 7 mL/min — ABNORMAL LOW (ref >60)
Glucose, Bld: 117 mg/dL — ABNORMAL HIGH (ref 70–99)
Phosphorus: 2.3 mg/dL — ABNORMAL LOW (ref 2.5–4.6)
Potassium: 3.6 mmol/L (ref 3.5–5.1)
Sodium: 130 mmol/L — ABNORMAL LOW (ref 135–145)

## 2023-05-07 LAB — BPAM RBC
Blood Product Expiration Date: 202502242359
ISSUE DATE / TIME: 202501291323
Unit Type and Rh: 5100

## 2023-05-07 LAB — GLUCOSE, CAPILLARY
Glucose-Capillary: 100 mg/dL — ABNORMAL HIGH (ref 70–99)
Glucose-Capillary: 152 mg/dL — ABNORMAL HIGH (ref 70–99)
Glucose-Capillary: 139 mg/dL — ABNORMAL HIGH (ref 70–99)
Glucose-Capillary: 157 mg/dL — ABNORMAL HIGH (ref 70–99)

## 2023-05-07 LAB — MAGNESIUM: Magnesium: 1.9 mg/dL (ref 1.7–2.4)

## 2023-05-07 MED ORDER — CEFEPIME HCL 2 G IV SOLR: 2.0000 g | INTRAVENOUS | Status: DC

## 2023-05-07 NOTE — Assessment & Plan Note (Signed)
Reported during physical therapy 1/28. Symptomatic with dizziness when standing.  - Abdominal binders, compression stockings - Holding home Amlodipine, Coreg

## 2023-05-07 NOTE — Progress Notes (Signed)
Patient ID: Louis Butler, male   DOB: [birth year], 68 y.o.   MRN: [id number] S: Feeling better today.  Bp improved.  O:BP 121/63 (BP Location: Right Arm)   Pulse 75   Temp (!) 97.5 F (36.4 C) (Oral)   Resp 14   Ht 6\' 3"  (1.905 m)   Wt 85.6 kg   SpO2 96%   BMI 23.59 kg/m   Intake/Output Summary (Last 24 hours) at 05/07/2023 1128 Last data filed at 05/07/2023 0600 Gross per 24 hour  Intake 668 ml  Output 725 ml  Net -57 ml   Intake/Output: I/O last 3 completed shifts: In: 1078 [P.O.:600; I.V.:70; Blood:408] Out: 995 [Urine:995]  Intake/Output this shift:  No intake/output data recorded. Weight change: 5.7 kg Gen: NAD CVS: RRR Resp:CTA Abd: +BS, soft, NT/ND Ext: no edema, LUE AVF +T/B  Recent Labs  Lab 04/30/23 2052 04/30/23 2224 05/01/23 1623 05/02/23 0423 05/03/23 0229 05/04/23 0329 05/05/23 0548 05/06/23 0250 05/07/23 0331  NA  --    < > 133* 133* 134* 132* 130* 130* 130*  K  --    < > 4.3 4.2 3.4* 3.3* 3.4* 3.2* 3.6  CL  --    < > 99 99 98 98 96* 94* 96*  CO2  --    < > 20* 23 24 23 23 26 24   GLUCOSE  --    < > 154* 104* 103* 96 93 106* 117*  BUN  --    < > 44* 50* 35* 31* 36* 23 29*  CREATININE  --    < > 6.28* 7.33* 5.09* 6.74* 8.81* 6.22* 7.69*  ALBUMIN 2.4*   < > 2.4* 2.3* 2.3* 2.1* 2.1* 2.3* 2.3*  CALCIUM  --    < > 7.5* 7.3* 7.6* 7.1* 7.1* 7.2* 7.4*  PHOS  --    < > 3.0 3.7 2.1* 2.8 2.6 1.7* 2.3*  AST 15  --   --   --   --   --   --   --   --   ALT 11  --   --   --   --   --   --   --   --    < > = values in this interval not displayed.   Liver Function Tests: Recent Labs  Lab 04/30/23 2052 04/30/23 2224 05/05/23 0548 05/06/23 0250 05/07/23 0331  AST 15  --   --   --   --   ALT 11  --   --   --   --   ALKPHOS 47  --   --   --   --   BILITOT 1.2  --   --   --   --   PROT 7.0  --   --   --   --   ALBUMIN 2.4*   < > 2.1* 2.3* 2.3*   < > = values in this interval not displayed.   No results for input(s): "LIPASE", "AMYLASE" in the last 168  hours. No results for input(s): "AMMONIA" in the last 168 hours. CBC: Recent Labs  Lab 05/03/23 0229 05/04/23 0329 05/05/23 0548 05/06/23 0250 05/06/23 1811 05/07/23 0331  WBC 7.8 8.3 7.0 5.6  --  4.9  HGB 7.8* 7.0* 7.0* 6.7* 8.1* 8.4*  HCT 23.3* 22.0* 21.5* 21.4* 25.8* 26.4*  MCV 91.4 94.4 94.3 95.5  --  95.0  PLT 137* 140* 147* 146*  --  161   Cardiac Enzymes: No results for input(s): "  CKTOTAL", "CKMB", "CKMBINDEX", "TROPONINI" in the last 168 hours. CBG: Recent Labs  Lab 05/06/23 0742 05/06/23 1139 05/06/23 1534 05/06/23 2100 05/07/23 0739  GLUCAP 101* 222* 231* 142* 100*    Iron Studies: No results for input(s): "IRON", "TIBC", "TRANSFERRIN", "FERRITIN" in the last 72 hours. Studies/Results: No results found.  sodium chloride   Intravenous Once   acetaminophen  1,000 mg Oral Q6H   Chlorhexidine Gluconate Cloth  6 each Topical Q0600   cholecalciferol  5,000 Units Oral Once per day on Monday Thursday   darbepoetin (ARANESP) injection - DIALYSIS  60 mcg Subcutaneous Q Mon-1800   feeding supplement  1 Container Oral BID BM   gabapentin  300 mg Oral QHS   heparin injection (subcutaneous)  5,000 Units Subcutaneous Q8H   insulin aspart  0-6 Units Subcutaneous TID WC   midodrine  10 mg Oral TID WC   multivitamin  1 tablet Oral QHS   pantoprazole  40 mg Oral Daily   sertraline  25 mg Oral Daily   tamsulosin  0.4 mg Oral Daily    BMET    Component Value Date/Time   NA 130 (L) 05/07/2023 0331   NA 133 (L) 06/10/2022 1528   K 3.6 05/07/2023 0331   CL 96 (L) 05/07/2023 0331   CO2 24 05/07/2023 0331   GLUCOSE 117 (H) 05/07/2023 0331   BUN 29 (H) 05/07/2023 0331   BUN 53 (H) 06/10/2022 1528   CREATININE 7.69 (H) 05/07/2023 0331   CALCIUM 7.4 (L) 05/07/2023 0331   CALCIUM 6.5 (LL) 04/30/2023 0410   GFRNONAA 7 (L) 05/07/2023 0331   GFRAA 15 (L) 08/09/2019 0523   CBC    Component Value Date/Time   WBC 4.9 05/07/2023 0331   RBC 2.78 (L) 05/07/2023 0331   HGB  8.4 (L) 05/07/2023 0331   HGB 10.7 (L) 06/02/2022 1217   HCT 26.4 (L) 05/07/2023 0331   HCT 32.2 (L) 06/02/2022 1217   PLT 161 05/07/2023 0331   PLT 159 06/02/2022 1217   MCV 95.0 05/07/2023 0331   MCV 99 (H) 06/02/2022 1217   MCH 30.2 05/07/2023 0331   MCHC 31.8 05/07/2023 0331   RDW 14.3 05/07/2023 0331   RDW 13.0 06/02/2022 1217   LYMPHSABS 1.6 06/02/2022 1217   MONOABS 0.7 04/08/2019 1621   EOSABS 0.3 06/02/2022 1217   BASOSABS 0.1 06/02/2022 1217    HPI:  Louis Butler is an 68 y.o. male with CKD 5 (f/b Dr. Ricard Dillon), DM, HTN, recent squamous cell ca H&N (surgery, XRT 2024), anemia who presented with N/V/abd pain and was found to have marked hyperkalemia and renal failure for which nephrology is consulted.  Was on HD 2021-06/2020 when he self discontinued and has followed in clinic since.  Last OV with Dr. Allena Katz was 02/19/24 - GFR11, Hb 9.6.  Pt unclear if he would be accepting of long term dialysis but has now committed.   Assessment/Plan:   New ESRD - tolerating IHD and had his second HD session yesterday.  Will plan for third tomorrow.  CLIP process started and he wants [facility]  only because he wants to be with Dr. Eliane Decree unit.  Plan for HD tomorrow to get on MWF schedule at [facility].  Plan if now for SNF, so may need to change outpatient HD units pending placement.  Hyperkalemia - resolved HTN - stable after transfusion.  Had been low.  Continue midodrine 10 mg tid. Anemia of ESRD - on ESA and transfuse for Hgb <  7.   Had blood transfusion yesterday.  Obstructive uropathy - now with foley  Disposition - awaiting for SNF placement.  He is set up with outpatient HD arrangements at [facility] on MWF schedule but that may change based on SNF location.    Vascular access - using LUE AVF without issues.  Irena Cords, MD [employer] [phone]

## 2023-05-07 NOTE — Progress Notes (Signed)
[name] out-pt HD needs discussed with CSW this am. Pt currently accepted at [facility]. Can assist with alternate clinic placement should that be needed due to snf placement. Will assist as needed.   Olivia Canter Renal Navigator [phone]

## 2023-05-07 NOTE — Assessment & Plan Note (Signed)
Depressed mood recently. Sertraline 25mg  started while inpatient - continue to monitor

## 2023-05-07 NOTE — Progress Notes (Signed)
Nutrition Follow-up  DOCUMENTATION CODES:   Non-severe (moderate) malnutrition in context of chronic illness  INTERVENTION:  Discontinue Boost Breeze Encourage adequate PO intake; double protein portions with meals Magic cup TID with meals, each supplement provides 290 kcal and 9 grams of protein Renal MVI with minerals daily  NUTRITION DIAGNOSIS:   Moderate Malnutrition related to chronic illness (renal disease) as evidenced by moderate fat depletion, mild muscle depletion, moderate muscle depletion, percent weight loss (16% weight loss within 6 months). - remains applicable  GOAL:   Patient will meet greater than or equal to 90% of their needs   MONITOR:   PO intake, Supplement acceptance, Skin  REASON FOR ASSESSMENT:   Consult Assessment of nutrition requirement/status, Diet education  ASSESSMENT:   68 yo male admitted with acute on chronic renal failure. PMH includes ESRD-stopped HD 06/2020, DM-2, GERD, HTN.  Tolerating iHD. Next session planned for Friday.  SNF placement pending.   Pt sitting up in bedside recliner at time of visit. Wife also present at bedside. He reports that his appetite is much improved. He reports as long as he likes what he receives he will eat good.  Pt is edentulous and needs soft textured foods. He is able to choose and tolerate foods well at this time. His wife as attempted to bring food from [employer] however d/t taste changes he has not eaten anything that has been provided.   Pt is not consuming Boost Breeze and does not consume any other protein supplements as he reports these cause diarrhea. Pt reports that he tries to avoid dairy as advised previously by physician however pt is agreeable to trying magic cup.   Meal completions: 1/27: 100% breakfast 1/28: 100% breakfast, lunch 1/29: 100% breakfast, lunch  Admit weight: 76.3 kg Current weight: 85.6 kg  Medications: Vitamin D3 5000 units MTh, SSI 0-6 units TID,  rena-vit, protonix  Labs:  Sodium 130 BUN 29 Cr 7.69 Phos 2.3  GFR 7 CBG's 100-231 x24 hours  UOP: x24 hours  Diet Order:   Diet Order             Diet regular Room service appropriate? Yes; Fluid consistency: Thin; Fluid restriction: 1200 mL Fluid  Diet effective now                   EDUCATION NEEDS:   Education needs have been addressed  Skin:  Skin Assessment: Skin Integrity Issues: Skin Integrity Issues:: Stage II Stage II: R buttocks  Last BM:  1/29  Height:   Ht Readings from Last 1 Encounters:  04/29/23 6\' 3"  (1.905 m)    Weight:   Wt Readings from Last 1 Encounters:  05/07/23 85.6 kg    Ideal Body Weight:  89.1 kg  BMI:  Body mass index is 23.59 kg/m.  Estimated Nutritional Needs:   Kcal:  2200-2400  Protein:  110-120 gm  Fluid:  1 L + UOP  Drusilla Kanner, RDN, LDN Clinical Nutrition

## 2023-05-07 NOTE — Assessment & Plan Note (Signed)
-  WOC, frequent turns -RD recs

## 2023-05-07 NOTE — Assessment & Plan Note (Addendum)
Pseudomonal UTI, with evidence of bilateral hydronephrosis c/f outlet obstruction. Urology has seen, recommend keep foley in place until OP f/u, and 14 day antibiotic course, OP cystoscopy. Pt is not making much urine at this time. Will plan to reach out to urology for further clarification regarding antibiotic course. -Zosyn (1/24-1/27), Cefepime (1/27-1/30), consider not continuing abx after today -Keep foley in place

## 2023-05-07 NOTE — Assessment & Plan Note (Addendum)
K 3.6 this morning -nephrology following -am RFP

## 2023-05-07 NOTE — TOC Progression Note (Signed)
Transition of Care [facility]) - Progression Note    Patient Details  Name: Louis Butler MRN: [id number] Date of Birth: Aug 08, 1955  Transition of Care [facility]) CM/SW Contact  Dellie Burns [place], [place] Phone Number: 05/07/2023, 1:15 PM  Clinical Narrative:  Spoke to pt's wife who is now requesting SNF placement for STR. Reviewed SNF placement process and answered questions. [name] wife requesting a SNF in [facility] in order for pt to remain at preferred outpatient HD clinic [facility] MWF 1225). Will begin SNF search and f/u with offers. Pt will require auth for SNF.   Dellie Burns, MSW, LCSW [phone] (coverage)       Expected Discharge Plan: Home w Home Health Services Barriers to Discharge: Continued Medical Work up  Expected Discharge Plan and Services   Discharge Planning Services: CM Consult Post Acute Care Choice: Home Health Living arrangements for the past 2 months: Single Family Home                 DME Arranged: Hospital bed DME Agency: [employer] Date DME Agency Contacted: 05/04/23 Time DME Agency Contacted: [phone] Representative spoke with at DME Agency: Earna Coder HH Arranged: PT, Nurse's Aide HH Agency: [facility] Date [facility] Agency Contacted: 05/04/23 Time HH Agency Contacted: 1636 Representative spoke with at [facility] Agency: Amy   Social Determinants of Health (SDOH) Interventions SDOH Screenings   Food Insecurity: Low Risk  (12/12/2022)   Received from [facility]  Housing: Low Risk  (12/12/2022)   Received from [facility]  Transportation Needs: No Transportation Needs (12/12/2022)   Received from [facility]  Utilities: Low Risk  (12/12/2022)   Received from [facility]  Alcohol Screen: Low Risk  (12/01/2022)  Depression (PHQ2-9): Medium Risk (12/24/2022)  Financial Resource Strain: Low Risk  (12/01/2022)  Physical Activity: Insufficiently Active (12/01/2022)  Social Connections: Moderately Integrated (12/01/2022)  Stress: Stress Concern  Present (12/01/2022)  Tobacco Use: Medium Risk (04/29/2023)  Health Literacy: Adequate Health Literacy (12/01/2022)    Readmission Risk Interventions    05/04/2023    4:35 PM  Readmission Risk Prevention Plan  Transportation Screening Complete  HRI or Home Care Consult Complete  Social Work Consult for Recovery Care Planning/Counseling Complete  Palliative Care Screening Not Applicable  Medication Review Oceanographer) Referral to Pharmacy

## 2023-05-07 NOTE — Assessment & Plan Note (Signed)
A1C of 6.8. On 6U lantus at home. Well controlled during admission.  -CBGs -very sensitive SSI

## 2023-05-07 NOTE — Plan of Care (Signed)
  Problem: Coping: Goal: Ability to adjust to condition or change in health will improve Outcome: Progressing   Problem: Fluid Volume: Goal: Ability to maintain a balanced intake and output will improve Outcome: Progressing   Problem: Metabolic: Goal: Ability to maintain appropriate glucose levels will improve Outcome: Progressing   Problem: Skin Integrity: Goal: Risk for impaired skin integrity will decrease Outcome: Progressing

## 2023-05-07 NOTE — Progress Notes (Signed)
Occupational Therapy Treatment Patient Details Name: Louis Butler MRN: [id number] DOB: 06-01-1955 Today's Date: 05/07/2023   History of present illness 68 y.o. male presented 1/22 with N/V/abd pain and was found to have marked hyperkalemia and renal failure. CRRT 1/23-1/24. 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 Ricard Dillon), DM, HTN, recent squamous cell ca H&N (surgery, XRT 2024), anemia.   OT comments  Hand held assist to walk in room/bathroom. Per wife, pt does not trust RW. Pt managed pericare mod I. Set up for grooming after toileting in sitting. Set up recliner beside wife to increase OOB time, pillow under buttocks. Pt with complaints of chronic neck pain, positioned in recliner with blanket neck roll. Patient will benefit from continued inpatient follow up therapy, <3 hours/day. Wife with concerns pt may have episode of orthostatic hypotension at home and she will be unable to prevent a fall.         If plan is discharge home, recommend the following:  A little help with walking and/or transfers;A little help with bathing/dressing/bathroom;Assistance with cooking/housework;Assist for transportation;Help with stairs or ramp for entrance   Equipment Recommendations  Wheelchair (measurements OT);Wheelchair cushion (measurements OT);Other (comment) (mattress overlay)    Recommendations for Other Services      Precautions / Restrictions Precautions Precautions: Fall Precaution Comments: Monitor BP. Orthostatic Restrictions Weight Bearing Restrictions Per Provider Order: No       Mobility Bed Mobility Overal bed mobility: Needs Assistance Bed Mobility: Supine to Sit     Supine to sit: Supervision     General bed mobility comments: Supervision for safety, no assist needed.    Transfers Overall transfer level: Needs assistance Equipment used: 1 person hand held assist Transfers: Sit to/from Stand Sit to Stand: Contact guard assist                 Balance Overall balance  assessment: Needs assistance   Sitting balance-Leahy Scale: Good     Standing balance support: Single extremity supported Standing balance-Leahy Scale: Poor                             ADL either performed or assessed with clinical judgement   ADL Overall ADL's : Needs assistance/impaired     Grooming: Wash/dry hands;Wash/dry face;Sitting;Set up           Upper Body Dressing : Set up;Sitting       Toilet Transfer: Minimal assistance;Ambulation   Toileting- Clothing Manipulation and Hygiene: Modified independent;Sitting/lateral lean       Functional mobility during ADLs: Minimal assistance      Extremity/Trunk Assessment              Vision       Perception     Praxis      Cognition Arousal: Alert Behavior During Therapy: WFL for tasks assessed/performed Overall Cognitive Status: Impaired/Different from baseline Area of Impairment: Memory, Problem solving                     Memory: Decreased short-term memory       Problem Solving: Slow processing General Comments: HOH        Exercises      Shoulder Instructions       General Comments      Pertinent Vitals/ Pain       Pain Assessment Pain Assessment: Faces Faces Pain Scale: Hurts little more Pain Location: chronic neck pain Pain Descriptors / Indicators: Aching Pain  Intervention(s): Repositioned  Home Living                                          Prior Functioning/Environment              Frequency  Min 1X/week        Progress Toward Goals  OT Goals(current goals can now be found in the care plan section)  Progress towards OT goals: Progressing toward goals  Acute Rehab OT Goals OT Goal Formulation: With patient/family Time For Goal Achievement: 05/18/23 Potential to Achieve Goals: Good  Plan      Co-evaluation                 AM-PAC OT "6 Clicks" Daily Activity     Outcome Measure   Help from another person  eating meals?: None Help from another person taking care of personal grooming?: A Little Help from another person toileting, which includes using toliet, bedpan, or urinal?: A Little Help from another person bathing (including washing, rinsing, drying)?: A Little Help from another person to put on and taking off regular upper body clothing?: A Little Help from another person to put on and taking off regular lower body clothing?: A Little 6 Click Score: 19    End of Session Equipment Utilized During Treatment: Gait belt  OT Visit Diagnosis: Unsteadiness on feet (R26.81);Muscle weakness (generalized) (M62.81)   Activity Tolerance Patient tolerated treatment well   Patient Left in chair;with call bell/phone within reach;with family/visitor present   Nurse Communication Mobility status        Time: 1610-9604 OT Time Calculation (min): 22 min  Charges: OT General Charges $OT Visit: 1 Visit OT Treatments $Self Care/Home Management : 8-22 mins  Berna Spare, OTR/L Acute Rehabilitation Services Office: [phone]   Evern Bio 05/07/2023, 10:18 AM

## 2023-05-07 NOTE — Progress Notes (Signed)
Physical Therapy Treatment Patient Details Name: Louis Butler MRN: [id number] DOB: 1955-07-11 Today's Date: 05/07/2023   History of Present Illness 68 y.o. male presented 1/22 with N/V/abd pain and was found to have marked hyperkalemia and renal failure. CRRT 1/23-1/24. 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 Ricard Dillon), DM, HTN, recent squamous cell ca H&N (surgery, XRT 2024), anemia.    PT Comments  Remains limited by orthostatic hypotension when standing. BP appears to be better while seated. May improve with application of compression stockings/abdominal binder. Wife does not feel comfortable assisting pt with current deficits; they now prefer SNF for post acute care. Will continue to progress as tolerated. Reviewed LE exercises, encouraged more time OOB with staff assist. Symptom awareness and techniques to manage/ for safety. Patient will continue to benefit from skilled physical therapy services to further improve independence with functional mobility.     Seated BP, LEs dependent in recliner 120/61. Standing 85/54 (MAP 64) + dizziness. After returning to seated position for several minutes, symptoms abate BP 100/56 (MAP 68).     If plan is discharge home, recommend the following: A little help with walking and/or transfers;A little help with bathing/dressing/bathroom;Assistance with cooking/housework;Assist for transportation;Help with stairs or ramp for entrance   Can travel by private vehicle     Yes  Equipment Recommendations  None recommended by PT    Recommendations for Other Services       Precautions / Restrictions Precautions Precautions: Fall Precaution Comments: Monitor BP. Orthostatic Restrictions Weight Bearing Restrictions Per Provider Order: No     Mobility  Bed Mobility               General bed mobility comments: in recliner    Transfers Overall transfer level: Needs assistance Equipment used: Rolling walker (2 wheels) Transfers: Sit to/from Stand Sit to Stand:  Contact guard assist           General transfer comment: CGA for safety from recliner, slow but stable with RW for support. fair control with descent into chair. + dizzy    Ambulation/Gait             Pre-gait activities: Weight shift, static march, heel raises, limited by dizziness (hypotensive.)     Stairs             Wheelchair Mobility     Tilt Bed    Modified Rankin (Stroke Patients Only)       Balance Overall balance assessment: Needs assistance Sitting-balance support: No upper extremity supported, Feet supported Sitting balance-Leahy Scale: Good     Standing balance support: Single extremity supported Standing balance-Leahy Scale: Poor                              Cognition Arousal: Alert Behavior During Therapy: WFL for tasks assessed/performed Overall Cognitive Status: Within Functional Limits for tasks assessed                                          Exercises General Exercises - Lower Extremity Ankle Circles/Pumps: AROM, Both, 10 reps, Supine Gluteal Sets: Strengthening, Both, 10 reps, Supine Long Arc Quad: Strengthening, Both, Seated, 5 reps Hip ABduction/ADduction: Strengthening, Both, 5 reps, Seated Hip Flexion/Marching: Strengthening, Both, 10 reps, Seated    General Comments General comments (skin integrity, edema, etc.): Seated BP, LEs dependent in recliner 120/61. Standing 85/54 (  MAP 64) + dizziness. After returning to seated position for several minutes, symptoms abate BP 100/56 (MAP 68).      Pertinent Vitals/Pain Pain Assessment Pain Assessment: Faces Faces Pain Scale: Hurts little more Pain Location: chronic neck pain Pain Descriptors / Indicators: Aching Pain Intervention(s): Monitored during session, Repositioned    Home Living                          Prior Function            PT Goals (current goals can now be found in the care plan section) Acute Rehab PT  Goals Patient Stated Goal: Get well. PT Goal Formulation: With patient Time For Goal Achievement: 05/22/23 Potential to Achieve Goals: Fair Progress towards PT goals: Progressing toward goals    Frequency    Min 1X/week      PT Plan      Co-evaluation              AM-PAC PT "6 Clicks" Mobility   Outcome Measure  Help needed turning from your back to your side while in a flat bed without using bedrails?: None Help needed moving from lying on your back to sitting on the side of a flat bed without using bedrails?: None Help needed moving to and from a bed to a chair (including a wheelchair)?: A Little Help needed standing up from a chair using your arms (e.g., wheelchair or bedside chair)?: A Little Help needed to walk in hospital room?: A Little Help needed climbing 3-5 steps with a railing? : Total 6 Click Score: 18    End of Session Equipment Utilized During Treatment: Gait belt Activity Tolerance: Treatment limited secondary to medical complications (Comment) (Drop in BP.) Patient left: in chair;with call bell/phone within reach;with chair alarm set;with family/visitor present   PT Visit Diagnosis: Unsteadiness on feet (R26.81);Muscle weakness (generalized) (M62.81);Difficulty in walking, not elsewhere classified (R26.2);Dizziness and giddiness (R42)     Time: 8295-6213 PT Time Calculation (min) (ACUTE ONLY): 13 min  Charges:    $Therapeutic Exercise: 8-22 mins PT General Charges $$ ACUTE PT VISIT: 1 Visit                     Kathlyn Sacramento, PT, DPT [facility]  Rehabilitation Services Physical Therapist Office: [phone] Website: [email or web address]    Berton Mount 05/07/2023, 1:13 PM

## 2023-05-07 NOTE — Assessment & Plan Note (Addendum)
S/p L neck dissection, L temporal bone resection and L ear canal dissection/flap in 10/2022. CT temporal bones showing post-operative changes without new masses or acute findings to explain L ear pain. Still complains of neck pain intermittently throughout admission. -CT neck soft tissue to further evaluate -[facility]-up with [facility] -Scheduled tylenol 1000mg  q6h for pain

## 2023-05-07 NOTE — Assessment & Plan Note (Addendum)
Hgb 8.4 this morning, improved from 6.5 yesterday s/p 1U PRBC. Probably due to frequent lab draws in addition to anemia of chronic disease. Iron studies including ferritin of 648, makes iron deficiency less likely.   -Continue to monitor -Recommend OP follow-up

## 2023-05-07 NOTE — Progress Notes (Signed)
Pharmacy Antibiotic Note  Louis Butler is a 68 y.o. male admitted on 04/29/2023 with UTI.  Pharmacy has been consulted for cefepime dosing.  Plan: Urology would like cefepime 2 grams iv qT/Th/Sa post HD extended through 2/5  Height: 6\' 3"  (190.5 cm) Weight: 85.6 kg (188 lb 11.4 oz) IBW/kg (Calculated) : 84.5  Temp (24hrs), Avg:98.1 F (36.7 C), Min:97.5 F (36.4 C), Max:98.8 F (37.1 C)  Recent Labs  Lab 05/03/23 0229 05/04/23 0329 05/05/23 0548 05/06/23 0250 05/07/23 0331  WBC 7.8 8.3 7.0 5.6 4.9  CREATININE 5.09* 6.74* 8.81* 6.22* 7.69*    Estimated Creatinine Clearance: 11.1 mL/min (A) (by C-G formula based on SCr of 7.69 mg/dL (H)).    Allergies  Allergen Reactions   Hydrocodone Hives and Itching   Percocet [Oxycodone-Acetaminophen] Hives and Itching     Thank you for allowing pharmacy to be a part of this patient's care.  Greta Doom BS, PharmD, BCPS Clinical Pharmacist 05/07/2023 3:02 PM  Contact: [phone] after 3 PM  "Be curious, not judgmental..." -Debbora Dus

## 2023-05-07 NOTE — Progress Notes (Signed)
Daily Progress Note Intern Pager: [phone]  Patient name: Louis Butler Medical record number: [id number] Date of birth: 1955/08/29 Age: 68 y.o. Gender: male  Primary Care Provider: Latrelle Dodrill, MD Consultants: CCM (signed off), nephrology  Code Status: Full   Pt Overview and Major Events to Date:  1/22: Admited to ICU, restarted CRRT 1/26: FMTS resumed care   Assessment and Plan:  68yo male with PMHx CKD5 now ESRD on dialysis, DM, HTN, SCC H&N, anemia who presented with N/V abdominal pain and confusion, found to have renal failure and significant hyperkalemia.  Wife and pt amenable to SNF placement after discharge due to generalized weakness, TOC consult placed for assistance with SNF planning. Assessment & Plan ESRD (end stage renal disease) (HCC) Encephalopathy and electrolyte abnormality resolved with dialysis. Pending SNF placement to coordinate with dialysis.  -Nephrology following, appreciate recommendations -Continue midodrine 10mg  TID and with dialysis Urinary tract infection  Bilateral Hydronephrosis Pseudomonal UTI, with evidence of bilateral hydronephrosis c/f outlet obstruction. Urology has seen, recommend keep foley in place until OP f/u, and 14 day antibiotic course, OP cystoscopy. Pt is not making much urine at this time. Will plan to reach out to urology for further clarification regarding antibiotic course. -Zosyn (1/24-1/27), Cefepime (1/27-1/30), consider not continuing abx after today -Keep foley in place Orthostatic hypotension Reported during physical therapy 1/28. Symptomatic with dizziness when standing.  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 CT temporal bones showing post-operative changes without new masses or acute findings to explain L ear pain. Still complains  of neck pain intermittently throughout admission. -CT neck soft tissue to further evaluate -[facility] -Scheduled tylenol 1000mg  q6h for pain Anemia Hgb 8.4 this morning, improved from 6.5 yesterday s/p 1U PRBC. Probably due to frequent lab draws in addition to anemia of chronic disease. Iron studies including ferritin of 648, makes iron deficiency less likely.   -Continue to monitor -Recommend OP follow-up Type 2 diabetes mellitus with diabetic chronic kidney disease (HCC) A1C of 6.8. On 6U lantus at home. Well controlled during admission.  -CBGs -very sensitive SSI Depressed mood Depressed mood recently. Sertraline 25mg  started while inpatient - continue to monitor Ulcer of sacral region, stage 2 (HCC) -WOC, frequent turns -RD recs Hyperkalemia (Resolved: 05/07/2023) K 3.6 this morning -nephrology following -am RFP   Chronic and Stable Problems:  HTN- holding BP medications 2/2 soft pressures, resume as tolerated GERD-Protonix 40 mg daily   FEN/GI: Regular diet PPx: Heparin Dispo:SNF pending clinical improvement   Subjective:  NAEON. No complaints with me this morning. Wife has no concerns.  Objective: Temp:  [97.8 F (36.6 C)-99 F (37.2 C)] 98.3 F (36.8 C) (01/30 0407) Pulse Rate:  [64-144] 75 (01/30 0400) Resp:  [12-23] 14 (01/30 0400) BP: (99-162)/(44-129) 121/63 (01/30 0400) SpO2:  [88 %-100 %] 96 % (01/30 0400) Weight:  [85.6 kg] 85.6 kg (01/30 0407) Physical Exam: General: chronically ill appearing, NAD Cardiovascular: well perfused Respiratory: normal work of breathing on RA Extremities: No swelling BLE  Laboratory: Most recent CBC Lab Results  Component Value Date   WBC 4.9 05/07/2023   HGB 8.4 (L) 05/07/2023   HCT 26.4 (L) 05/07/2023   MCV 95.0 05/07/2023   PLT 161 05/07/2023   Most recent BMP    Latest Ref Rng & Units 05/07/2023  3:31 AM  BMP  Glucose 70 - 99 mg/dL 161   BUN 8 - 23 mg/dL 29   Creatinine 0.96 - 1.24  mg/dL 0.45   Sodium 409 - 811 mmol/L 130   Potassium 3.5 - 5.1 mmol/L 3.6   Chloride 98 - 111 mmol/L 96   CO2 22 - 32 mmol/L 24   Calcium 8.9 - 10.3 mg/dL 7.4    Vinal Rosengrant, DO 05/07/2023, 7:03 AM  PGY-1, [facility] FPTS Intern pager: [phone], text pages welcome Secure chat group [facility] Teaching Service

## 2023-05-07 NOTE — NC FL2 (Addendum)
[place] MEDICAID FL2 LEVEL OF CARE FORM     IDENTIFICATION  Patient Name: Louis Butler Birthdate: 1955-08-13 Sex: male Admission Date (Current Location): 04/29/2023  [facility] and [place] Number:  Producer, television/film/video and Address:  [facility], [address] [place], [place] [address]      Provider Number: [id number]  Attending Physician Name and Address:  Westley Chandler, MD  Relative Name and Phone Number:       Current Level of Care: Hospital Recommended Level of Care: Skilled Nursing Facility Prior Approval Number:    Date Approved/Denied:   PASRR Number: [id number]  Discharge Plan: SNF    Current Diagnoses: Patient Active Problem List   Diagnosis Date Noted   Orthostatic hypotension 05/07/2023   Depressed mood 05/04/2023   Urinary tract infection  Bilateral Hydronephrosis 05/03/2023   Type 2 diabetes mellitus with diabetic chronic kidney disease (HCC) 05/03/2023   Ulcer of sacral region, stage 2 (HCC) 05/03/2023   Anemia 05/03/2023   Malnutrition of moderate degree 05/01/2023   Uremia 04/30/2023   Acute renal failure superimposed on chronic kidney disease (HCC) 04/30/2023   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   Chronic bilateral low back pain without sciatica 07/27/2022   Neck mass 06/02/2022   Coronary artery calcification 07/02/2021   Chest pain 11/06/2020   Skin rash 07/19/2020   Recurrent UTI    Pain due to onychomycosis of toenails of both feet 12/28/2019   History of colonic polyps 09/29/2019   Exocrine pancreatic insufficiency 09/27/2019   BPH (benign prostatic hyperplasia) 08/08/2019   Intestinal metaplasia of gastric mucosa 07/25/2019   Loose bowel movements 05/21/2019   Indigestion 05/21/2019   Preop examination 05/21/2019   Calculus of gallbladder without cholecystitis without obstruction 05/21/2019   Diabetic neuropathy (HCC) 05/16/2019   Urinary retention 04/15/2019   Alcohol  dependence (HCC) 04/15/2019   Right renal mass 04/15/2019   Constipation 03/19/2019   Tobacco use 03/14/2019   Rash of both feet 03/14/2019   ESRD (end stage renal disease) (HCC) 03/13/2019   Diabetes mellitus (HCC) 08/22/2015   Hypertension 08/22/2015    Orientation RESPIRATION BLADDER Height & Weight     Self, Time, Situation, Place  Normal Continent Weight: 188 lb 11.4 oz (85.6 kg) Height:  6\' 3"  (190.5 cm)  BEHAVIORAL SYMPTOMS/MOOD NEUROLOGICAL BOWEL NUTRITION STATUS      Continent    AMBULATORY STATUS COMMUNICATION OF NEEDS Skin   Extensive Assist Verbally Normal                       Personal Care Assistance Level of Assistance  Bathing, Feeding, Dressing Bathing Assistance: Maximum assistance Feeding assistance: Limited assistance Dressing Assistance: Maximum assistance     Functional Limitations Info  Sight, Hearing, Speech Sight Info: Adequate Hearing Info: Adequate Speech Info: Adequate    SPECIAL CARE FACTORS FREQUENCY  PT (By licensed PT), OT (By licensed OT)                    Contractures Contractures Info: Not present    Additional Factors Info  Code Status Code Status Info: FULL CODE             Current Medications (05/07/2023):  This is the current hospital active medication list Current Facility-Administered Medications  Medication Dose Route Frequency Provider Last Rate Last Admin   0.9 %  sodium chloride infusion (Manually program via Guardrails IV Fluids)  Intravenous Once Alfredo Martinez, MD       acetaminophen (TYLENOL) tablet 1,000 mg  1,000 mg Oral Q6H Ivery Quale, MD   1,000 mg at 05/07/23 1100   ceFEPIme (MAXIPIME) 2 g in sodium chloride 0.9 % 100 mL IVPB  2 g Intravenous Q T,Th,Sa-HD Everhart, Kirstie, DO 200 mL/hr at 05/05/23 1740 2 g at 05/05/23 1740   Chlorhexidine Gluconate Cloth 2 % PADS 6 each  6 each Topical Q0600 Tyler Pita, MD   6 each at 05/05/23 2050   cholecalciferol (VITAMIN D3) 25 MCG (1000 UNIT)  tablet 5,000 Units  5,000 Units Oral Once per day on Monday Thursday Patrici Ranks, MD   5,000 Units at 05/07/23 1610   Darbepoetin Alfa (ARANESP) injection 60 mcg  60 mcg Subcutaneous Q Mon-1800 Terrial Rhodes, MD   60 mcg at 05/04/23 1637   docusate sodium (COLACE) capsule 100 mg  100 mg Oral BID PRN Albustami, Flonnie Hailstone, MD       feeding supplement (BOOST / RESOURCE BREEZE) liquid 1 Container  1 Container Oral BID BM Oretha Milch, MD   1 Container at 05/03/23 1451   gabapentin (NEURONTIN) capsule 300 mg  300 mg Oral QHS Patrici Ranks, MD   300 mg at 05/06/23 2103   heparin injection 5,000 Units  5,000 Units Subcutaneous Q8H Maxwell, Allee, MD   5,000 Units at 05/06/23 2103   hydrOXYzine (ATARAX) tablet 25 mg  25 mg Oral Q6H PRN Pia Mau D, PA-C   25 mg at 05/07/23 0900   insulin aspart (novoLOG) injection 0-6 Units  0-6 Units Subcutaneous TID WC Selmer Dominion B, NP   2 Units at 05/06/23 1700   loperamide (IMODIUM) capsule 2 mg  2 mg Oral QID PRN Patrici Ranks, MD   2 mg at 05/06/23 2210   midodrine (PROAMATINE) tablet 10 mg  10 mg Oral TID WC Selmer Dominion B, NP   10 mg at 05/07/23 1144   midodrine (PROAMATINE) tablet 10 mg  10 mg Oral Q dialysis Ethelene Hal, MD       multivitamin (RENA-VIT) tablet 1 tablet  1 tablet Oral QHS Oretha Milch, MD   1 tablet at 05/06/23 2102   Oral care mouth rinse  15 mL Mouth Rinse PRN Oretha Milch, MD       pantoprazole (PROTONIX) EC tablet 40 mg  40 mg Oral Daily Patrici Ranks, MD   40 mg at 05/07/23 1100   polyethylene glycol (MIRALAX / GLYCOLAX) packet 17 g  17 g Oral Daily PRN Patrici Ranks, MD       sertraline (ZOLOFT) tablet 25 mg  25 mg Oral Daily Tiffany Kocher, DO   25 mg at 05/07/23 1100   tamsulosin (FLOMAX) capsule 0.4 mg  0.4 mg Oral Daily Patrici Ranks, MD   0.4 mg at 05/07/23 1100     Discharge Medications: Please see discharge summary for a list of discharge medications.  Relevant Imaging  Results:  Relevant Lab Results:   Additional Information SS# [id number]; HD at [facility] MWF [address] [place], [place]

## 2023-05-07 NOTE — Assessment & Plan Note (Addendum)
Encephalopathy and electrolyte abnormality resolved with dialysis. Pending SNF placement to coordinate with dialysis.  -Nephrology following, appreciate recommendations -Continue midodrine 10mg  TID and with dialysis

## 2023-05-08 DIAGNOSIS — N189 Chronic kidney disease, unspecified: Secondary | ICD-10-CM | POA: Diagnosis not present

## 2023-05-08 DIAGNOSIS — N179 Acute kidney failure, unspecified: Secondary | ICD-10-CM | POA: Diagnosis not present

## 2023-05-08 LAB — RENAL FUNCTION PANEL
Albumin: 2.3 g/dL — ABNORMAL LOW (ref 3.5–5.0)
Anion gap: 13 (ref 5–15)
BUN: 42 mg/dL — ABNORMAL HIGH (ref 8–23)
CO2: 22 mmol/L (ref 22–32)
Calcium: 7.4 mg/dL — ABNORMAL LOW (ref 8.9–10.3)
Chloride: 96 mmol/L — ABNORMAL LOW (ref 98–111)
Creatinine, Ser: 8.84 mg/dL — ABNORMAL HIGH (ref 0.61–1.24)
GFR, Estimated: 6 mL/min — ABNORMAL LOW (ref >60)
Glucose, Bld: 130 mg/dL — ABNORMAL HIGH (ref 70–99)
Phosphorus: 2.7 mg/dL (ref 2.5–4.6)
Potassium: 3.9 mmol/L (ref 3.5–5.1)
Sodium: 131 mmol/L — ABNORMAL LOW (ref 135–145)

## 2023-05-08 LAB — CBC
HCT: 26.8 % — ABNORMAL LOW (ref 39.0–52.0)
Hemoglobin: 8.6 g/dL — ABNORMAL LOW (ref 13.0–17.0)
MCH: 30.4 pg (ref 26.0–34.0)
MCHC: 32.1 g/dL (ref 30.0–36.0)
MCV: 94.7 fL (ref 80.0–100.0)
Platelets: 180 10*3/uL (ref 150–400)
RBC: 2.83 MIL/uL — ABNORMAL LOW (ref 4.22–5.81)
RDW: 14.6 % (ref 11.5–15.5)
WBC: 5.5 10*3/uL (ref 4.0–10.5)
nRBC: 0 % (ref 0.0–0.2)

## 2023-05-08 LAB — GLUCOSE, CAPILLARY
Glucose-Capillary: 99 mg/dL (ref 70–99)
Glucose-Capillary: 113 mg/dL — ABNORMAL HIGH (ref 70–99)
Glucose-Capillary: 176 mg/dL — ABNORMAL HIGH (ref 70–99)

## 2023-05-08 LAB — MAGNESIUM: Magnesium: 1.9 mg/dL (ref 1.7–2.4)

## 2023-05-08 MED ORDER — ALBUMIN HUMAN 25 % IV SOLN
25.0000 g | Freq: Once | INTRAVENOUS | Status: AC
  Administered 2023-05-08: 25 g via INTRAVENOUS

## 2023-05-08 MED ORDER — SODIUM CHLORIDE 0.9 % IV SOLN
2.0000 g | INTRAVENOUS | Status: DC
  Administered 2023-05-11 – 2023-05-13 (×2): 2 g via INTRAVENOUS
  Filled 2023-05-08 (×3): qty 12.5

## 2023-05-08 MED ORDER — ALBUMIN HUMAN 25 % IV SOLN: 25.0000 g | Freq: Once | INTRAVENOUS | Status: DC

## 2023-05-08 NOTE — Assessment & Plan Note (Addendum)
S/p L neck dissection, L temporal bone resection and L ear canal dissection/flap in 10/2022. CT temporal bones showing post-operative changes without new masses or acute findings to explain L ear pain. Still complains of neck pain intermittently throughout admission.CT soft tissue neck unremarkable.  -[facility] -Scheduled tylenol 1000mg  q6h for pain

## 2023-05-08 NOTE — Plan of Care (Signed)

## 2023-05-08 NOTE — Progress Notes (Signed)
Patient ID: Louis Butler, male   DOB: 1956/04/06, 68 y.o.   MRN: [id number]  S: Feeling fine today.  Bp variable  O:BP (!) 147/118   Pulse 84   Temp 98.1 F (36.7 C) (Oral)   Resp 15   Ht 6\' 3"  (1.905 m)   Wt 83 kg   SpO2 96%   BMI 22.87 kg/m   Intake/Output Summary (Last 24 hours) at 05/08/2023 9528 Last data filed at 05/08/2023 0600 Gross per 24 hour  Intake 1030 ml  Output 1340 ml  Net -310 ml   Intake/Output: I/O last 3 completed shifts: In: 1150 [P.O.:1050; IV Piggyback:100] Out: 1865 [Urine:1865]  Intake/Output this shift:  No intake/output data recorded. Weight change: -2.6 kg Gen: NAD CVS: RRR Resp:CTA Abd: +BS, soft, NT/ND Ext: no edema, LUE AVF +T/B  Recent Labs  Lab 05/02/23 0423 05/03/23 0229 05/04/23 0329 05/05/23 0548 05/06/23 0250 05/07/23 0331 05/08/23 0259  NA 133* 134* 132* 130* 130* 130* 131*  K 4.2 3.4* 3.3* 3.4* 3.2* 3.6 3.9  CL 99 98 98 96* 94* 96* 96*  CO2 23 24 23 23 26 24 22   GLUCOSE 104* 103* 96 93 106* 117* 130*  BUN 50* 35* 31* 36* 23 29* 42*  CREATININE 7.33* 5.09* 6.74* 8.81* 6.22* 7.69* 8.84*  ALBUMIN 2.3* 2.3* 2.1* 2.1* 2.3* 2.3* 2.3*  CALCIUM 7.3* 7.6* 7.1* 7.1* 7.2* 7.4* 7.4*  PHOS 3.7 2.1* 2.8 2.6 1.7* 2.3* 2.7   Liver Function Tests: Recent Labs  Lab 05/06/23 0250 05/07/23 0331 05/08/23 0259  ALBUMIN 2.3* 2.3* 2.3*   No results for input(s): "LIPASE", "AMYLASE" in the last 168 hours. No results for input(s): "AMMONIA" in the last 168 hours. CBC: Recent Labs  Lab 05/04/23 0329 05/05/23 0548 05/06/23 0250 05/06/23 1811 05/07/23 0331 05/08/23 0259  WBC 8.3 7.0 5.6  --  4.9 5.5  HGB 7.0* 7.0* 6.7* 8.1* 8.4* 8.6*  HCT 22.0* 21.5* 21.4* 25.8* 26.4* 26.8*  MCV 94.4 94.3 95.5  --  95.0 94.7  PLT 140* 147* 146*  --  161 180   Cardiac Enzymes: No results for input(s): "CKTOTAL", "CKMB", "CKMBINDEX", "TROPONINI" in the last 168 hours. CBG: Recent Labs  Lab 05/07/23 0739 05/07/23 1151 05/07/23 1530  05/07/23 2115 05/08/23 0754  GLUCAP 100* 152* 139* 157* 99    Iron Studies: No results for input(s): "IRON", "TIBC", "TRANSFERRIN", "FERRITIN" in the last 72 hours. Studies/Results: CT CERVICAL SPINE WO CONTRAST Result Date: 05/07/2023 CLINICAL DATA:  Neck pain. EXAM: CT CERVICAL SPINE WITHOUT CONTRAST TECHNIQUE: Multidetector CT imaging of the cervical spine was performed without intravenous contrast. Multiplanar CT image reconstructions were also generated. RADIATION DOSE REDUCTION: This exam was performed according to the departmental dose-optimization program which includes automated exposure control, adjustment of the mA and/or kV according to patient size and/or use of iterative reconstruction technique. COMPARISON:  Neck CT dated 06/05/2022. FINDINGS: Alignment: No acute subluxation. There is straightening of normal cervical lordosis which may be positional or due to muscle spasm. No Skull base and vertebrae: Acute fracture.  Osteopenia. Soft tissues and spinal canal: No prevertebral fluid or swelling. No visible canal hematoma. Disc levels: No acute findings. Degenerative changes with disc space narrowing and spurring/osteophyte at C5-C6. There is mild narrowing of the left C5-C6 neural foramina. Upper chest: Negative. Other: Bilateral carotid bulb calcified plaques. IMPRESSION: 1. No acute/traumatic cervical spine pathology. 2. Degenerative changes at C5-C6. Electronically Signed   By: Elgie Collard M.D.   On: 05/07/2023 18:06  sodium chloride   Intravenous Once   acetaminophen  1,000 mg Oral Q6H   Chlorhexidine Gluconate Cloth  6 each Topical Q0600   cholecalciferol  5,000 Units Oral Once per day on Monday Thursday   darbepoetin (ARANESP) injection - DIALYSIS  60 mcg Subcutaneous Q Mon-1800   feeding supplement  1 Container Oral BID BM   gabapentin  300 mg Oral QHS   heparin injection (subcutaneous)  5,000 Units Subcutaneous Q8H   insulin aspart  0-6 Units Subcutaneous TID WC    midodrine  10 mg Oral TID WC   multivitamin  1 tablet Oral QHS   pantoprazole  40 mg Oral Daily   sertraline  25 mg Oral Daily   tamsulosin  0.4 mg Oral Daily    BMET    Component Value Date/Time   NA 131 (L) 05/08/2023 0259   NA 133 (L) 06/10/2022 1528   K 3.9 05/08/2023 0259   CL 96 (L) 05/08/2023 0259   CO2 22 05/08/2023 0259   GLUCOSE 130 (H) 05/08/2023 0259   BUN 42 (H) 05/08/2023 0259   BUN 53 (H) 06/10/2022 1528   CREATININE 8.84 (H) 05/08/2023 0259   CALCIUM 7.4 (L) 05/08/2023 0259   CALCIUM 6.5 (LL) 04/30/2023 0410   GFRNONAA 6 (L) 05/08/2023 0259   GFRAA 15 (L) 08/09/2019 0523   CBC    Component Value Date/Time   WBC 5.5 05/08/2023 0259   RBC 2.83 (L) 05/08/2023 0259   HGB 8.6 (L) 05/08/2023 0259   HGB 10.7 (L) 06/02/2022 1217   HCT 26.8 (L) 05/08/2023 0259   HCT 32.2 (L) 06/02/2022 1217   PLT 180 05/08/2023 0259   PLT 159 06/02/2022 1217   MCV 94.7 05/08/2023 0259   MCV 99 (H) 06/02/2022 1217   MCH 30.4 05/08/2023 0259   MCHC 32.1 05/08/2023 0259   RDW 14.6 05/08/2023 0259   RDW 13.0 06/02/2022 1217   LYMPHSABS 1.6 06/02/2022 1217   MONOABS 0.7 04/08/2019 1621   EOSABS 0.3 06/02/2022 1217   BASOSABS 0.1 06/02/2022 1217    HPI:  Louis Butler is an 68 y.o. male with CKD 5 (f/b Dr. Ricard Dillon), DM, HTN, recent squamous cell ca H&N (surgery, XRT 2024), anemia who presented with N/V/abd pain and was found to have marked hyperkalemia and renal failure for which nephrology is consulted.  Was on HD 2021-06/2020 when he self discontinued and has followed in clinic since.  Last OV with Dr. Allena Katz was 02/19/24 - GFR11, Hb 9.6.  Pt unclear if he would be accepting of long term dialysis but has now committed.   Assessment/Plan:   New ESRD - tolerating IHD and had his second HD session 1/30.  Will plan for third today.  CLIP process started and he wants [facility] only because he wants to be with Dr. Eliane Decree unit.  Plan for HD today  to get on MWF schedule at [facility]-   has been accepted there.  Plan if now for SNF, so may need to change outpatient HD units pending placement.  Hyperkalemia - resolved HTN - stable after transfusion.  Had been low.  Continue midodrine 10 mg tid. 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   Had blood transfusion 1/29  Obstructive uropathy - now with foley  Disposition - awaiting for SNF placement.  He is set up with outpatient HD arrangements at [facility] on MWF schedule but that may change based on SNF location.  Vascular access - using LUE AVF without issues.  Cecille Aver  [employer] [phone]

## 2023-05-08 NOTE — Assessment & Plan Note (Signed)
S/p L neck dissection, L temporal bone resection and L ear canal dissection/flap in 10/2022. CT temporal bones showing post-operative changes without new masses or acute findings to explain L ear pain. Still complains of neck pain intermittently throughout admission. CT soft tissue neck unremarkable.  - Follow-up with [facility] - Scheduled tylenol 1000mg  q6h for pain

## 2023-05-08 NOTE — Assessment & Plan Note (Addendum)
-  WOC, frequent turns -RD recs

## 2023-05-08 NOTE — Assessment & Plan Note (Signed)
A1C of 6.8. On 6U lantus at home. Well controlled during admission.  - CBGs - vsSSI

## 2023-05-08 NOTE — Assessment & Plan Note (Signed)
-  WOC, frequent turns -RD recs

## 2023-05-08 NOTE — Assessment & Plan Note (Signed)
Depressed mood recently. Sertraline 25mg  started while inpatient. - Continue Sertraline 25 mg daily - Monitor

## 2023-05-08 NOTE — Progress Notes (Addendum)
Daily Progress Note Intern Pager: [phone]  Patient name: Louis Butler Medical record number: [id number] Date of birth: [birth year] Age: 68 y.o. Gender: male  Primary Care Provider: Latrelle Dodrill, MD Consultants: CCM (signed off), nephrology  Code Status: Full   Pt Overview and Major Events to Date:  1/22: Admited to ICU, restarted CRRT 1/26: FMTS resumed care   Assessment and Plan:  68yo male with PMHx CKD5 now ESRD on dialysis, DM, HTN, SCC H&N, anemia who presented with N/V abdominal pain and confusion, found to have renal failure and significant hyperkalemia now on dialysis (had self-discontinued several years ago).  Patient medically stable for discharge, pending SNF placement on Monday. Assessment & Plan ESRD (end stage renal disease) (HCC) Encephalopathy and electrolyte abnormality resolved with dialysis. Pending SNF placement to coordinate with dialysis.  -Nephrology following, appreciate recommendations -Continue midodrine 10mg  TID and with dialysis Urinary tract infection  Bilateral Hydronephrosis Pseudomonal UTI, with evidence of bilateral hydronephrosis c/f outlet obstruction. Urology has seen, recommend keep foley in place until OP f/u, and 14 day antibiotic course, OP cystoscopy. Pt is not making much urine at this time.  -Zosyn (1/24-1/27), Cefepime (1/27-1/30) -Cefepime with HD now until 2/5 - Confirmed he can get Cefepime at HD center  Orthostatic hypotension Reported during physical therapy 1/28. Symptomatic with dizziness when standing.  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 CT temporal bones showing post-operative changes without new masses or acute findings to explain L ear pain. Still complains of neck pain intermittently throughout admission.CT soft  tissue neck unremarkable.  -Follow-up with [facility] -Scheduled tylenol 1000mg  q6h for pain Anemia Hgb 8.6 this morning, stable, thought to be anemia of chronic disease -Continue to monitor -Recommend OP follow-up Type 2 diabetes mellitus with diabetic chronic kidney disease (HCC) A1C of 6.8. On 6U lantus at home. Well controlled during admission.  -CBGs -very sensitive SSI Depressed mood Depressed mood recently. Sertraline 25mg  started while inpatient - continue to monitor Ulcer of sacral region, stage 2 (HCC) -WOC, frequent turns -RD recs Malnutrition of moderate degree RD following    Chronic and Stable Problems:  HTN- holding BP medications 2/2 soft pressures, resume as tolerated GERD-Protonix 40 mg daily   FEN/GI: Regular diet PPx: Heparin Dispo:SNF in 2-3 days.   Subjective:  NAEON. No concerns this morning  Objective: Temp:  [97.5 F (36.4 C)-98.3 F (36.8 C)] 97.5 F (36.4 C) (01/31 0305) Pulse Rate:  [66-89] 70 (01/31 0400) Resp:  [13-20] 13 (01/31 0400) BP: (94-134)/(47-80) 94/51 (01/31 0400) SpO2:  [96 %-100 %] 97 % (01/31 0400) Weight:  [83 kg] 83 kg (01/31 0500) Physical Exam: General: chronically ill appearing, NAD Cardiovascular: well perfused, RRR, no m/r/g Respiratory: normal work of breathing on RA, mild rhonchi Extremities: No swelling BLE  Laboratory: Most recent CBC Lab Results  Component Value Date   WBC 5.5 05/08/2023   HGB 8.6 (L) 05/08/2023   HCT 26.8 (L) 05/08/2023   MCV 94.7 05/08/2023   PLT 180 05/08/2023   Most recent BMP    Latest Ref Rng & Units 05/08/2023    2:59 AM  BMP  Glucose 70 - 99 mg/dL 147   BUN 8 - 23 mg/dL 42   Creatinine 8.29 - 1.24 mg/dL 5.62   Sodium 130 - 865 mmol/L  131   Potassium 3.5 - 5.1 mmol/L 3.9   Chloride 98 - 111 mmol/L 96   CO2 22 - 32 mmol/L 22   Calcium 8.9 - 10.3 mg/dL 7.4    Imaging/Diagnostic Tests: CT C-spine WO Contrast 05/07/23 IMPRESSION: 1. No acute/traumatic cervical  spine pathology. 2. Degenerative changes at C5-C6.  Zamire Whitehurst, DO 05/08/2023, 7:08 AM  PGY-1, [facility] Family Medicine FPTS Intern pager: [phone], text pages welcome Secure chat group [facility] Teaching Service

## 2023-05-08 NOTE — Plan of Care (Signed)

## 2023-05-08 NOTE — Progress Notes (Addendum)
Advised by CSW that pt will possibly d/c to snf on Monday if snf auth received and pt stable for d/c. Contacted [facility] to be advised pt will possibly d/c to snf on Monday and would need to start at clinic on Wed. Update provided to nephrologist. Will assist as needed.   Olivia Canter Renal Navigator [phone]  Addendum at 12:00 pm: Received message from pharmacist with request to see if HD clinic has iv cefepime for pt's last does on Wed, Feb. 5. Contacted clinic and advised that clinic has antibiotic available. Update provided to pharmacist.

## 2023-05-08 NOTE — Assessment & Plan Note (Addendum)
Reported during physical therapy 1/28. Symptomatic with dizziness when standing.  - Abdominal binders, compression stockings - Holding home Amlodipine, Coreg - Target MAP >60

## 2023-05-08 NOTE — Assessment & Plan Note (Addendum)
-   RD following

## 2023-05-08 NOTE — Assessment & Plan Note (Addendum)
 Depressed mood recently. Sertraline 25mg  started while inpatient - continue to monitor

## 2023-05-08 NOTE — Assessment & Plan Note (Signed)
Hgb 7.5 this morning, stable, thought to be anemia of chronic disease - Monitor with CBC Monday - Recommend OP follow-up

## 2023-05-08 NOTE — Assessment & Plan Note (Signed)
Encephalopathy and electrolyte abnormality resolved with dialysis. Pending SNF placement to coordinate with dialysis.  - Nephrology following, appreciate recommendations - Continue Midodrine 10mg  TID and with HD - RFP Monday

## 2023-05-08 NOTE — Assessment & Plan Note (Addendum)
Pseudomonal UTI, with evidence of bilateral hydronephrosis c/f outlet obstruction. Urology has seen, recommend keep foley in place until OP f/u, and 14 day antibiotic course, OP cystoscopy. Pt is not making much urine at this time.  -Zosyn (1/24-1/27), Cefepime (1/27-1/30) -Cefepime with HD now until 2/5 - Confirmed he can get Cefepime at HD center

## 2023-05-08 NOTE — Progress Notes (Incomplete)
Daily Progress Note Intern Pager: [phone]  Patient name: Louis Butler Medical record number: [id number] Date of birth: 1955-12-18 Age: 68 y.o. Gender: male  Primary Care Provider: Latrelle Dodrill, MD Consultants: CCM, Nephrology Code Status: Full Code   Pt Overview and Major Events to Date:  1/22: Admited to ICU, restarted CRRT 1/26: FMTS resumed care   Assessment and Plan: Louis Butler is a 68 yo male with past medical hx of CKD5 now ESRD on HD, T2DM, HTN, SCC H&N, and anemia who presented with N/V, abdominal pain, and confusion found to have renal failure and significant hyperkalemia. Underwent CRRT and restarted HD during this admission after self-discontinuing several years ago. Patient is now medically stable for discharge, pending SNF placement on Monday.  Assessment & Plan ESRD (end stage renal disease) (HCC) Encephalopathy and electrolyte abnormality resolved with dialysis. Pending SNF placement to coordinate with dialysis.  - Nephrology following, appreciate recommendations - Continue Midodrine 10mg  TID and with HD Urinary tract infection  Bilateral Hydronephrosis Pseudomonal UTI, with evidence of bilateral hydronephrosis c/f outlet obstruction. Urology has seen, recommend keep foley in place until OP f/u, and 14 day antibiotic course, OP cystoscopy. Pt is oliguric.  - Completed abx: Zosyn (1/24-1/27), Cefepime (1/27-1/30) - Continue Cefepime with HD (1/31-2/5), confirmed with HD center Orthostatic hypotension Reported during physical therapy 1/28. Symptomatic with dizziness when standing.  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 CT temporal bones showing post-operative changes without new masses or acute findings to explain L ear pain. Still complains of neck pain  intermittently throughout admission.CT soft tissue neck unremarkable.  - Follow-up with [facility] - Scheduled tylenol 1000mg  q6h for pain Anemia Hgb *** this morning, stable, thought to be anemia of chronic disease - Monitor with CBC - Recommend OP follow-up Type 2 diabetes mellitus with diabetic chronic kidney disease (HCC) A1C of 6.8. On 6U lantus at home. Well controlled during admission.  - CBGs - vsSSI Depressed mood Depressed mood recently. Sertraline 25mg  started while inpatient. - Continue Sertraline 25 mg daily - Monitor Ulcer of sacral region, stage 2 (HCC) - WOC, frequent turns - RD recs Malnutrition of moderate degree RD following  Chronic and Stable Problems: HTN: holding BP medications 2/2 soft pressures, resume as tolerated GERD: Protonix 40 mg daily  FEN/GI: Regular diet PPx: Heparin Dispo: SNF in 2-3 days  Subjective:  NAEON. No concerns this morning. Remains stable for discharge.  Objective: Temp:  [97.5 F (36.4 C)-98.8 F (37.1 C)] 98.4 F (36.9 C) (01/31 1746) Pulse Rate:  [66-90] 80 (01/31 1830) Resp:  [10-18] 16 (01/31 1830) BP: (70-151)/(44-118) 136/75 (01/31 1830) SpO2:  [90 %-100 %] 99 % (01/31 1830) Weight:  [83 kg-83.9 kg] 83.9 kg (01/31 1801) Physical Exam: General: Chronically ill appearing male in NAD. Cardiovascular: RRR. No M/R/G. Respiratory: Mild rhonchi, normal WOB on RA. No wheezing, crackles, or diminished breath sounds.  Abdomen: Soft, nontender, nondistended. Normoactive bowel sounds.  Extremities: No BLE swelling.  Laboratory: Most recent CBC Lab Results  Component Value Date   WBC 5.5 05/08/2023   HGB 8.6 (L) 05/08/2023   HCT 26.8 (L) 05/08/2023   MCV 94.7 05/08/2023   PLT 180 05/08/2023   Most recent BMP    Latest Ref Rng & Units 05/08/2023    2:59 AM  BMP  Glucose 70 - 99 mg/dL 478   BUN 8 - 23 mg/dL 42   Creatinine 2.95 - 1.24 mg/dL 6.21   Sodium 308 - 657 mmol/L 131   Potassium 3.5 - 5.1 mmol/L 3.9    Chloride 98 - 111 mmol/L 96   CO2 22 - 32 mmol/L 22   Calcium 8.9 - 10.3 mg/dL 7.4    Imaging/Diagnostic Tests: No new imaging.  Fortunato Curling, DO 05/08/2023, 8:08 PM  PGY-1, [facility] Family Medicine FPTS Intern pager: [phone], text pages welcome Secure chat group [facility] Teaching Service

## 2023-05-08 NOTE — Assessment & Plan Note (Signed)
Reported during physical therapy 1/28. Symptomatic with dizziness when standing.  - Abdominal binders, compression stockings - Holding home Amlodipine, Coreg - Target MAP >60

## 2023-05-08 NOTE — Assessment & Plan Note (Addendum)
 A1C of 6.8. On 6U lantus at home. Well controlled during admission.  -CBGs -very sensitive SSI

## 2023-05-08 NOTE — Assessment & Plan Note (Addendum)
 Encephalopathy and electrolyte abnormality resolved with dialysis. Pending SNF placement to coordinate with dialysis.  -Nephrology following, appreciate recommendations -Continue midodrine 10mg  TID and with dialysis

## 2023-05-08 NOTE — Assessment & Plan Note (Signed)
Pseudomonal UTI, with evidence of bilateral hydronephrosis c/f outlet obstruction. Urology has seen, recommend keep foley in place until OP f/u, and 14 day antibiotic course, OP cystoscopy. Pt is oliguric.  - Completed abx: Zosyn (1/24-1/27), Cefepime (1/27-1/30) - Continue Cefepime with HD (1/31-2/5), confirmed with HD center

## 2023-05-08 NOTE — Assessment & Plan Note (Signed)
-   RD following

## 2023-05-08 NOTE — TOC Progression Note (Signed)
Transition of Care [facility]) - Progression Note    Patient Details  Name: Louis Butler MRN: [id number] Date of Birth: 1955/09/01  [facility] CM/SW Contact  Dellie Burns [place], [place] Phone Number: 05/08/2023, 11:32 AM  Clinical Narrative: Provided current SNF offers to pt's wife and she has accepted [facility] [employer]. Confirmed bed with [place] at [facility] and they are aware of HD schedule MWF at [facility]. Per [place], they are able to accept pt beginning Monday pending auth and medical clearance. Home and Community/[employer] auth for SNF submitted, reference [id number].   Dellie Burns, MSW, LCSW [phone] (coverage)       Expected Discharge Plan: Home w Home Health Services Barriers to Discharge: Continued Medical Work up  Expected Discharge Plan and Services   Discharge Planning Services: CM Consult Post Acute Care Choice: Home Health Living arrangements for the past 2 months: Single Family Home                 DME Arranged: Hospital bed DME Agency: [employer] Date DME Agency Contacted: 05/04/23 Time DME Agency Contacted: [phone] Representative spoke with at DME Agency: Earna Coder HH Arranged: PT, Nurse's Aide HH Agency: [facility] Date [facility] Agency Contacted: 05/04/23 Time HH Agency Contacted: [id number] Representative spoke with at [facility] Agency: Amy   Social Determinants of Health (SDOH) Interventions SDOH Screenings   Food Insecurity: Low Risk  (12/12/2022)   Received from [facility]  Housing: Low Risk  (12/12/2022)   Received from [facility]  Transportation Needs: No Transportation Needs (12/12/2022)   Received from [facility]  Utilities: Low Risk  (12/12/2022)   Received from [facility]  Alcohol Screen: Low Risk  (12/01/2022)  Depression (PHQ2-9): Medium Risk (12/24/2022)  Financial Resource Strain: Low Risk  (12/01/2022)  Physical Activity: Insufficiently Active (12/01/2022)  Social Connections: Moderately Integrated (12/01/2022)  Stress: Stress  Concern Present (12/01/2022)  Tobacco Use: Medium Risk (04/29/2023)  Health Literacy: Adequate Health Literacy (12/01/2022)    Readmission Risk Interventions    05/04/2023    4:35 PM  Readmission Risk Prevention Plan  Transportation Screening Complete  HRI or Home Care Consult Complete  Social Work Consult for Recovery Care Planning/Counseling Complete  Palliative Care Screening Not Applicable  Medication Review Oceanographer) Referral to Pharmacy

## 2023-05-08 NOTE — Assessment & Plan Note (Addendum)
Hgb 8.6 this morning, stable, thought to be anemia of chronic disease -Continue to monitor -Recommend OP follow-up

## 2023-05-09 DIAGNOSIS — N189 Chronic kidney disease, unspecified: Secondary | ICD-10-CM | POA: Diagnosis not present

## 2023-05-09 DIAGNOSIS — N179 Acute kidney failure, unspecified: Secondary | ICD-10-CM | POA: Diagnosis not present

## 2023-05-09 LAB — RENAL FUNCTION PANEL
Albumin: 2.2 g/dL — ABNORMAL LOW (ref 3.5–5.0)
Anion gap: 11 (ref 5–15)
BUN: 23 mg/dL (ref 8–23)
CO2: 27 mmol/L (ref 22–32)
Calcium: 7.6 mg/dL — ABNORMAL LOW (ref 8.9–10.3)
Chloride: 96 mmol/L — ABNORMAL LOW (ref 98–111)
Creatinine, Ser: 5.08 mg/dL — ABNORMAL HIGH (ref 0.61–1.24)
GFR, Estimated: 12 mL/min — ABNORMAL LOW (ref >60)
Glucose, Bld: 92 mg/dL (ref 70–99)
Phosphorus: 2.3 mg/dL — ABNORMAL LOW (ref 2.5–4.6)
Potassium: 3.6 mmol/L (ref 3.5–5.1)
Sodium: 134 mmol/L — ABNORMAL LOW (ref 135–145)

## 2023-05-09 LAB — CBC
HCT: 23.5 % — ABNORMAL LOW (ref 39.0–52.0)
Hemoglobin: 7.5 g/dL — ABNORMAL LOW (ref 13.0–17.0)
MCH: 30.4 pg (ref 26.0–34.0)
MCHC: 31.9 g/dL (ref 30.0–36.0)
MCV: 95.1 fL (ref 80.0–100.0)
Platelets: 158 10*3/uL (ref 150–400)
RBC: 2.47 MIL/uL — ABNORMAL LOW (ref 4.22–5.81)
RDW: 14.8 % (ref 11.5–15.5)
WBC: 5.3 10*3/uL (ref 4.0–10.5)
nRBC: 0 % (ref 0.0–0.2)

## 2023-05-09 LAB — GLUCOSE, CAPILLARY
Glucose-Capillary: 79 mg/dL (ref 70–99)
Glucose-Capillary: 116 mg/dL — ABNORMAL HIGH (ref 70–99)
Glucose-Capillary: 152 mg/dL — ABNORMAL HIGH (ref 70–99)
Glucose-Capillary: 208 mg/dL — ABNORMAL HIGH (ref 70–99)

## 2023-05-09 LAB — MAGNESIUM: Magnesium: 1.7 mg/dL (ref 1.7–2.4)

## 2023-05-09 MED ORDER — SODIUM CHLORIDE 0.9 % IV SOLN
2.0000 g | INTRAVENOUS | Status: AC
  Administered 2023-05-09: 2 g via INTRAVENOUS
  Filled 2023-05-09: qty 12.5

## 2023-05-09 MED ORDER — DARBEPOETIN ALFA 150 MCG/0.3ML IJ SOSY
150.0000 ug | PREFILLED_SYRINGE | INTRAMUSCULAR | Status: DC
  Administered 2023-05-11 – 2023-05-18 (×2): 150 ug via SUBCUTANEOUS
  Filled 2023-05-09 (×3): qty 0.3

## 2023-05-09 MED ORDER — LIDOCAINE-PRILOCAINE 2.5-2.5 % EX CREA
TOPICAL_CREAM | CUTANEOUS | Status: DC | PRN
Start: 2023-05-09 — End: 2023-06-13
  Filled 2023-05-09: qty 5

## 2023-05-09 NOTE — Progress Notes (Signed)
Patient ID: Louis Butler, male   DOB: December 31, 1955, 68 y.o.   MRN: [id number]  S: HD yest-  removed really nothing due to starting BP being low-  labs reflective of HD - he is eating breakfast without complaint   O:BP (!) 98/51 (BP Location: Right Arm)   Pulse 69   Temp 98.3 F (36.8 C) (Oral)   Resp 14   Ht 6\' 3"  (1.905 m)   Wt 83.8 kg   SpO2 95%   BMI 23.09 kg/m   Intake/Output Summary (Last 24 hours) at 05/09/2023 0827 Last data filed at 05/09/2023 0600 Gross per 24 hour  Intake 1200 ml  Output 549.8 ml  Net 650.2 ml   Intake/Output: I/O last 3 completed shifts: In: 1650 [P.O.:1050; Other:600] Out: 1539.8 [Urine:1440; Other:99.8]  Intake/Output this shift:  No intake/output data recorded. Weight change: 0.8 kg Gen: NAD CVS: RRR Resp:CTA Abd: +BS, soft, NT/ND Ext: no edema, LUE AVF +T/B-  some bruising   Recent Labs  Lab 05/03/23 0229 05/04/23 0329 05/05/23 0548 05/06/23 0250 05/07/23 0331 05/08/23 0259 05/09/23 0452  NA 134* 132* 130* 130* 130* 131* 134*  K 3.4* 3.3* 3.4* 3.2* 3.6 3.9 3.6  CL 98 98 96* 94* 96* 96* 96*  CO2 24 23 23 26 24 22 27   GLUCOSE 103* 96 93 106* 117* 130* 92  BUN 35* 31* 36* 23 29* 42* 23  CREATININE 5.09* 6.74* 8.81* 6.22* 7.69* 8.84* 5.08*  ALBUMIN 2.3* 2.1* 2.1* 2.3* 2.3* 2.3* 2.2*  CALCIUM 7.6* 7.1* 7.1* 7.2* 7.4* 7.4* 7.6*  PHOS 2.1* 2.8 2.6 1.7* 2.3* 2.7 2.3*   Liver Function Tests: Recent Labs  Lab 05/07/23 0331 05/08/23 0259 05/09/23 0452  ALBUMIN 2.3* 2.3* 2.2*   No results for input(s): "LIPASE", "AMYLASE" in the last 168 hours. No results for input(s): "AMMONIA" in the last 168 hours. CBC: Recent Labs  Lab 05/05/23 0548 05/06/23 0250 05/06/23 1811 05/07/23 0331 05/08/23 0259 05/09/23 0452  WBC 7.0 5.6  --  4.9 5.5 5.3  HGB 7.0* 6.7*   < > 8.4* 8.6* 7.5*  HCT 21.5* 21.4*   < > 26.4* 26.8* 23.5*  MCV 94.3 95.5  --  95.0 94.7 95.1  PLT 147* 146*  --  161 180 158   < > = values in this interval not displayed.    Cardiac Enzymes: No results for input(s): "CKTOTAL", "CKMB", "CKMBINDEX", "TROPONINI" in the last 168 hours. CBG: Recent Labs  Lab 05/07/23 2115 05/08/23 0754 05/08/23 1132 05/08/23 2129 05/09/23 0750  GLUCAP 157* 99 113* 176* 79    Iron Studies: No results for input(s): "IRON", "TIBC", "TRANSFERRIN", "FERRITIN" in the last 72 hours. Studies/Results: CT CERVICAL SPINE WO CONTRAST Result Date: 05/07/2023 CLINICAL DATA:  Neck pain. EXAM: CT CERVICAL SPINE WITHOUT CONTRAST TECHNIQUE: Multidetector CT imaging of the cervical spine was performed without intravenous contrast. Multiplanar CT image reconstructions were also generated. RADIATION DOSE REDUCTION: This exam was performed according to the departmental dose-optimization program which includes automated exposure control, adjustment of the mA and/or kV according to patient size and/or use of iterative reconstruction technique. COMPARISON:  Neck CT dated 06/05/2022. FINDINGS: Alignment: No acute subluxation. There is straightening of normal cervical lordosis which may be positional or due to muscle spasm. No Skull base and vertebrae: Acute fracture.  Osteopenia. Soft tissues and spinal canal: No prevertebral fluid or swelling. No visible canal hematoma. Disc levels: No acute findings. Degenerative changes with disc space narrowing and spurring/osteophyte at C5-C6. There is mild  narrowing of the left C5-C6 neural foramina. Upper chest: Negative. Other: Bilateral carotid bulb calcified plaques. IMPRESSION: 1. No acute/traumatic cervical spine pathology. 2. Degenerative changes at C5-C6. Electronically Signed   By: Elgie Collard M.D.   On: 05/07/2023 18:06    acetaminophen  1,000 mg Oral Q6H   Chlorhexidine Gluconate Cloth  6 each Topical Q0600   cholecalciferol  5,000 Units Oral Once per day on Monday Thursday   darbepoetin (ARANESP) injection - DIALYSIS  60 mcg Subcutaneous Q Mon-1800   feeding supplement  1 Container Oral BID BM    gabapentin  300 mg Oral QHS   heparin injection (subcutaneous)  5,000 Units Subcutaneous Q8H   insulin aspart  0-6 Units Subcutaneous TID WC   midodrine  10 mg Oral TID WC   multivitamin  1 tablet Oral QHS   pantoprazole  40 mg Oral Daily   sertraline  25 mg Oral Daily   tamsulosin  0.4 mg Oral Daily    BMET    Component Value Date/Time   NA 134 (L) 05/09/2023 0452   NA 133 (L) 06/10/2022 1528   K 3.6 05/09/2023 0452   CL 96 (L) 05/09/2023 0452   CO2 27 05/09/2023 0452   GLUCOSE 92 05/09/2023 0452   BUN 23 05/09/2023 0452   BUN 53 (H) 06/10/2022 1528   CREATININE 5.08 (H) 05/09/2023 0452   CALCIUM 7.6 (L) 05/09/2023 0452   CALCIUM 6.5 (LL) 04/30/2023 0410   GFRNONAA 12 (L) 05/09/2023 0452   GFRAA 15 (L) 08/09/2019 0523   CBC    Component Value Date/Time   WBC 5.3 05/09/2023 0452   RBC 2.47 (L) 05/09/2023 0452   HGB 7.5 (L) 05/09/2023 0452   HGB 10.7 (L) 06/02/2022 1217   HCT 23.5 (L) 05/09/2023 0452   HCT 32.2 (L) 06/02/2022 1217   PLT 158 05/09/2023 0452   PLT 159 06/02/2022 1217   MCV 95.1 05/09/2023 0452   MCV 99 (H) 06/02/2022 1217   MCH 30.4 05/09/2023 0452   MCHC 31.9 05/09/2023 0452   RDW 14.8 05/09/2023 0452   RDW 13.0 06/02/2022 1217   LYMPHSABS 1.6 06/02/2022 1217   MONOABS 0.7 04/08/2019 1621   EOSABS 0.3 06/02/2022 1217   BASOSABS 0.1 06/02/2022 1217    HPI:  68 y.o. male with CKD 5 (f/b Dr. Ricard Dillon), DM, HTN, recent squamous cell ca H&N (surgery, XRT 2024), anemia who presented with N/V/abd pain and was found to have marked hyperkalemia and renal failure for which nephrology is consulted.  Was on HD 2021-06/2020 when he self discontinued and has followed in clinic since.  Last OV with Dr. Allena Katz was 02/19/24 - GFR11, Hb 9.6.  Pt unclear if he would be accepting of long term dialysis but has now committed.   Assessment/Plan:   New ESRD - tolerating IHD and had his third HD session 1/31.  Will plan for next on Monday.  Has been accepted at  [facility]. Plan if now for discharge to [facility] on Monday, so will  need dialysis here on Monday priot to discharge then to start at [facility] on Wednesday 2/5   HTN - stable after transfusion.  Had been low.  Continue midodrine 10 mg tid. 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   Had blood transfusion 1/29 -  will inc ESA dose for Monday  Obstructive uropathy - now with foley  Disposition - awaiting for SNF placement.  Discharge slated for Monday     Vascular access - using LUE AVF without issues. Bones-  PTH 203-  phos ok -  no binder   Cecille Aver  [employer] [phone]

## 2023-05-10 LAB — HEMOGLOBIN AND HEMATOCRIT, BLOOD
HCT: 25.2 % — ABNORMAL LOW (ref 39.0–52.0)
Hemoglobin: 8 g/dL — ABNORMAL LOW (ref 13.0–17.0)

## 2023-05-10 LAB — GLUCOSE, CAPILLARY
Glucose-Capillary: 89 mg/dL (ref 70–99)
Glucose-Capillary: 307 mg/dL — ABNORMAL HIGH (ref 70–99)
Glucose-Capillary: 123 mg/dL — ABNORMAL HIGH (ref 70–99)
Glucose-Capillary: 164 mg/dL — ABNORMAL HIGH (ref 70–99)

## 2023-05-10 MED ORDER — CHLORHEXIDINE GLUCONATE CLOTH 2 % EX PADS: 6.0000 | MEDICATED_PAD | Freq: Every day | CUTANEOUS | Status: DC

## 2023-05-10 NOTE — Progress Notes (Signed)
Daily Progress Note Intern Pager: [phone]  Patient name: Louis Butler Medical record number: [id number] Date of birth: 08-Sep-1955 Age: 68 y.o. Gender: male  Primary Care Provider: Latrelle Dodrill, MD Consultants: Nephrology Code Status: Full  Pt Overview and Major Events to Date:  1/22: Admited to ICU, restarted CRRT 1/26: FMTS resumed care   Assessment and Plan: Louis Butler is a 68 y.o. male awaiting discharge to SNF now that he is medically stable after undergoing CRRT and restarting HD due to progression to ESRD. Pertinent PMH/PSH includes ESRD on HD, T2DM, HTN, SCC of head & neck, anemia.  Assessment & Plan ESRD (end stage renal disease) (HCC) Encephalopathy and electrolyte abnormality resolved with dialysis. Pending SNF placement to coordinate with dialysis.  - Nephrology following, appreciate recommendations - Continue Midodrine 10mg  TID and with HD - RFP Monday Urinary tract infection  Bilateral Hydronephrosis Pseudomonal UTI, with evidence of bilateral hydronephrosis c/f outlet obstruction. Urology has seen, recommend keep foley in place until OP f/u, and 14 day antibiotic course, OP cystoscopy. Pt is oliguric.  - Completed abx: Zosyn (1/24-1/27), Cefepime (1/27-1/30) - Continue Cefepime with HD (1/31-2/5), confirmed with HD center Orthostatic hypotension Reported during physical therapy 1/28. Symptomatic with dizziness when standing.  Likely will need to discontinue amlodipine and Coreg. 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 CT temporal bones showing post-operative changes without new masses or acute findings to explain L ear pain. Still complains of neck pain intermittently throughout admission. CT soft tissue neck unremarkable.  - Follow-up with [facility] - Scheduled tylenol 1000mg  q6h for pain Anemia Stable, anemia chronic disease.  Hemoglobin 8.0.  Transfusion threshold <7. - Monitor with CBC Monday - Recommend OP follow-up Depressed mood Depressed mood recently. Sertraline 25mg  started while inpatient. - Continue Sertraline 25 mg daily Ulcer of sacral region, stage 2 (HCC) - WOC, frequent turns - RD recs Malnutrition of moderate degree RD following  Chronic and Stable Issues: HTN: Home medications include amlodipine 5 mg nightly, Coreg 25 mg twice daily. Holding BP medications 2/2 soft pressures, resume as tolerated GERD: Protonix 40 mg daily T2DM:  vsSSI.  A1c 6.8.  Return to 6u Lantus when discharged.   FEN/GI: Regular PPx: Heparin Dispo:SNF [facility]) tomorrow (2/3). Barriers include placement.   Subjective:  No complaints this morning.  He is aware of plan to go to SNF on Monday.  Objective: Temp:  [97.9 F (36.6 C)-98.6 F (37 C)] 98.3 F (36.8 C) (02/02 0308) Pulse Rate:  [58-76] 76 (02/02 0600) Resp:  [0-31] 19 (02/02 0600) BP: (101-130)/(53-60) 130/56 (02/02 0400) SpO2:  [94 %-100 %] 100 % (02/02 0600) Weight:  [83.8 kg] 83.8 kg (02/02 0321) Physical Exam: General: Arousable to voice, alert, NAD Cardiovascular: RRR, murmurs auscultated Respiratory: No wheezing, crackles or diminished breath sounds, normal WOB Extremities: No swelling of BLEs  Laboratory: Most recent CBC Lab Results  Component Value Date   WBC 5.3 05/09/2023   HGB 8.0 (L) 05/10/2023   HCT 25.2 (L) 05/10/2023   MCV 95.1 05/09/2023   PLT 158 05/09/2023   Most recent BMP    Latest Ref Rng & Units 05/09/2023    4:52 AM  BMP  Glucose 70 - 99 mg/dL 92   BUN 8 - 23 mg/dL 23  Creatinine 0.61 - 1.24 mg/dL 9.56   Sodium 213 - 086 mmol/L 134   Potassium 3.5 - 5.1 mmol/L 3.6   Chloride 98 - 111 mmol/L 96   CO2 22 - 32 mmol/L 27   Calcium 8.9 - 10.3 mg/dL 7.6    Imaging/Diagnostic Tests: No recent imaging results.  Shelby Mattocks, DO 05/10/2023, 7:04 AM  PGY-3, [facility] FPTS Intern pager: [phone], text pages welcome Secure chat group [facility] Teaching Service

## 2023-05-10 NOTE — Plan of Care (Signed)

## 2023-05-10 NOTE — Assessment & Plan Note (Addendum)
Pseudomonal UTI, with evidence of bilateral hydronephrosis c/f outlet obstruction. Urology has seen, recommend keep foley in place until OP f/u, and 14 day antibiotic course, OP cystoscopy. Pt is oliguric.  - Completed abx: Zosyn (1/24-1/27), Cefepime (1/27-1/30) - Continue Cefepime with HD (1/31-2/5), confirmed with HD center

## 2023-05-10 NOTE — Assessment & Plan Note (Addendum)
Encephalopathy and electrolyte abnormality resolved with dialysis. Pending SNF placement to coordinate with dialysis.  - Nephrology following, appreciate recommendations - Continue Midodrine 10mg  TID and with HD - RFP Monday

## 2023-05-10 NOTE — Assessment & Plan Note (Signed)
S/p L neck dissection, L temporal bone resection and L ear canal dissection/flap in 10/2022. CT temporal bones showing post-operative changes without new masses or acute findings to explain L ear pain. Still complains of neck pain intermittently throughout admission. CT soft tissue neck unremarkable.  - Follow-up with [facility] - Scheduled tylenol 1000mg  q6h for pain

## 2023-05-10 NOTE — Progress Notes (Signed)
Patient ID: Louis Butler, male   DOB: February 15, 1956, 68 y.o.   MRN: [id number]  S: no new issues - plan still in place for him to leave tomorrow after HD   O:BP 115/74   Pulse 78   Temp 98 F (36.7 C) (Oral)   Resp 17   Ht 6\' 3"  (1.905 m)   Wt 83.8 kg   SpO2 95%   BMI 23.09 kg/m   Intake/Output Summary (Last 24 hours) at 05/10/2023 7829 Last data filed at 05/10/2023 0600 Gross per 24 hour  Intake --  Output 1250 ml  Net -1250 ml   Intake/Output: I/O last 3 completed shifts: In: 600 [P.O.:600] Out: 1700 [Urine:1700]  Intake/Output this shift:  No intake/output data recorded. Weight change: 0 kg Gen: NAD CVS: RRR Resp:CTA Abd: +BS, soft, NT/ND Ext: no edema, LUE AVF +T/B-  some bruising   Recent Labs  Lab 05/04/23 0329 05/05/23 0548 05/06/23 0250 05/07/23 0331 05/08/23 0259 05/09/23 0452  NA 132* 130* 130* 130* 131* 134*  K 3.3* 3.4* 3.2* 3.6 3.9 3.6  CL 98 96* 94* 96* 96* 96*  CO2 23 23 26 24 22 27   GLUCOSE 96 93 106* 117* 130* 92  BUN 31* 36* 23 29* 42* 23  CREATININE 6.74* 8.81* 6.22* 7.69* 8.84* 5.08*  ALBUMIN 2.1* 2.1* 2.3* 2.3* 2.3* 2.2*  CALCIUM 7.1* 7.1* 7.2* 7.4* 7.4* 7.6*  PHOS 2.8 2.6 1.7* 2.3* 2.7 2.3*   Liver Function Tests: Recent Labs  Lab 05/07/23 0331 05/08/23 0259 05/09/23 0452  ALBUMIN 2.3* 2.3* 2.2*   No results for input(s): "LIPASE", "AMYLASE" in the last 168 hours. No results for input(s): "AMMONIA" in the last 168 hours. CBC: Recent Labs  Lab 05/05/23 0548 05/06/23 0250 05/06/23 1811 05/07/23 0331 05/08/23 0259 05/09/23 0452 05/10/23 0338  WBC 7.0 5.6  --  4.9 5.5 5.3  --   HGB 7.0* 6.7*   < > 8.4* 8.6* 7.5* 8.0*  HCT 21.5* 21.4*   < > 26.4* 26.8* 23.5* 25.2*  MCV 94.3 95.5  --  95.0 94.7 95.1  --   PLT 147* 146*  --  161 180 158  --    < > = values in this interval not displayed.   Cardiac Enzymes: No results for input(s): "CKTOTAL", "CKMB", "CKMBINDEX", "TROPONINI" in the last 168 hours. CBG: Recent Labs  Lab  05/09/23 0750 05/09/23 1143 05/09/23 1534 05/09/23 2033 05/10/23 0721  GLUCAP 79 116* 152* 208* 89    Iron Studies: No results for input(s): "IRON", "TIBC", "TRANSFERRIN", "FERRITIN" in the last 72 hours. Studies/Results: No results found.   acetaminophen  1,000 mg Oral Q6H   Chlorhexidine Gluconate Cloth  6 each Topical Q0600   cholecalciferol  5,000 Units Oral Once per day on Monday Thursday   [START ON 05/11/2023] darbepoetin (ARANESP) injection - DIALYSIS  150 mcg Subcutaneous Q Mon-1800   feeding supplement  1 Container Oral BID BM   gabapentin  300 mg Oral QHS   heparin injection (subcutaneous)  5,000 Units Subcutaneous Q8H   insulin aspart  0-6 Units Subcutaneous TID WC   midodrine  10 mg Oral TID WC   multivitamin  1 tablet Oral QHS   pantoprazole  40 mg Oral Daily   sertraline  25 mg Oral Daily   tamsulosin  0.4 mg Oral Daily    BMET    Component Value Date/Time   NA 134 (L) 05/09/2023 0452   NA 133 (L) 06/10/2022 1528   K 3.6  05/09/2023 0452   CL 96 (L) 05/09/2023 0452   CO2 27 05/09/2023 0452   GLUCOSE 92 05/09/2023 0452   BUN 23 05/09/2023 0452   BUN 53 (H) 06/10/2022 1528   CREATININE 5.08 (H) 05/09/2023 0452   CALCIUM 7.6 (L) 05/09/2023 0452   CALCIUM 6.5 (LL) 04/30/2023 0410   GFRNONAA 12 (L) 05/09/2023 0452   GFRAA 15 (L) 08/09/2019 0523   CBC    Component Value Date/Time   WBC 5.3 05/09/2023 0452   RBC 2.47 (L) 05/09/2023 0452   HGB 8.0 (L) 05/10/2023 0338   HGB 10.7 (L) 06/02/2022 1217   HCT 25.2 (L) 05/10/2023 0338   HCT 32.2 (L) 06/02/2022 1217   PLT 158 05/09/2023 0452   PLT 159 06/02/2022 1217   MCV 95.1 05/09/2023 0452   MCV 99 (H) 06/02/2022 1217   MCH 30.4 05/09/2023 0452   MCHC 31.9 05/09/2023 0452   RDW 14.8 05/09/2023 0452   RDW 13.0 06/02/2022 1217   LYMPHSABS 1.6 06/02/2022 1217   MONOABS 0.7 04/08/2019 1621   EOSABS 0.3 06/02/2022 1217   BASOSABS 0.1 06/02/2022 1217    HPI:  68 y.o. male with CKD 5 (f/b Dr. Ricard Dillon),  DM, HTN, recent squamous cell ca H&N (surgery, XRT 2024), anemia who presented with N/V/abd pain and was found to have marked hyperkalemia and renal failure for which nephrology is consulted.  Was on HD 2021-06/2020 when he self discontinued and has followed in clinic since.  Last OV with Dr. Allena Katz was 02/19/24 - GFR11, Hb 9.6.  Pt unclear if he would be accepting of long term dialysis but has now committed.   Assessment/Plan:   New ESRD - tolerating IHD and had his third HD session 1/31.  Will plan for next on Monday.  Has been accepted at  [facility]. Plan if now for discharge to [facility] on Monday, so will  need dialysis here on Monday prior to discharge then to start at [facility] on Wednesday 2/5   HTN - stable after transfusion.  Had been low.  Continue midodrine 10 mg tid. 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   Had blood transfusion 1/29 -  will inc ESA dose for Monday  Obstructive uropathy - now with foley  Disposition - awaiting for SNF placement.  Discharge slated for Monday     Vascular access - using LUE AVF without issues. Bones-  PTH 203-  phos ok -  no binder   Cecille Aver  [employer] [phone]

## 2023-05-10 NOTE — Assessment & Plan Note (Addendum)
-  WOC, frequent turns -RD recs

## 2023-05-10 NOTE — Assessment & Plan Note (Addendum)
Depressed mood recently. Sertraline 25mg  started while inpatient. - Continue Sertraline 25 mg daily

## 2023-05-10 NOTE — Assessment & Plan Note (Addendum)
Stable, anemia chronic disease.  Hemoglobin 8.0.  Transfusion threshold <7. - Monitor with CBC Monday - Recommend OP follow-up

## 2023-05-10 NOTE — Assessment & Plan Note (Addendum)
-   RD following

## 2023-05-10 NOTE — Assessment & Plan Note (Signed)
Reported during physical therapy 1/28. Symptomatic with dizziness when standing.  Likely will need to discontinue amlodipine and Coreg. - Abdominal binders, compression stockings - Holding home Amlodipine, Coreg - Target MAP >60

## 2023-05-11 DIAGNOSIS — N19 Unspecified kidney failure: Secondary | ICD-10-CM | POA: Diagnosis not present

## 2023-05-11 LAB — CBC
HCT: 25.1 % — ABNORMAL LOW (ref 39.0–52.0)
Hemoglobin: 7.8 g/dL — ABNORMAL LOW (ref 13.0–17.0)
MCH: 30.4 pg (ref 26.0–34.0)
MCHC: 31.1 g/dL (ref 30.0–36.0)
MCV: 97.7 fL (ref 80.0–100.0)
Platelets: 180 10*3/uL (ref 150–400)
RBC: 2.57 MIL/uL — ABNORMAL LOW (ref 4.22–5.81)
RDW: 15 % (ref 11.5–15.5)
WBC: 4.4 10*3/uL (ref 4.0–10.5)
nRBC: 0 % (ref 0.0–0.2)

## 2023-05-11 LAB — RENAL FUNCTION PANEL
Albumin: 2.2 g/dL — ABNORMAL LOW (ref 3.5–5.0)
Anion gap: 12 (ref 5–15)
BUN: 49 mg/dL — ABNORMAL HIGH (ref 8–23)
CO2: 21 mmol/L — ABNORMAL LOW (ref 22–32)
Calcium: 7.1 mg/dL — ABNORMAL LOW (ref 8.9–10.3)
Chloride: 96 mmol/L — ABNORMAL LOW (ref 98–111)
Creatinine, Ser: 7.57 mg/dL — ABNORMAL HIGH (ref 0.61–1.24)
GFR, Estimated: 7 mL/min — ABNORMAL LOW (ref >60)
Glucose, Bld: 96 mg/dL (ref 70–99)
Phosphorus: 2.6 mg/dL (ref 2.5–4.6)
Potassium: 4.9 mmol/L (ref 3.5–5.1)
Sodium: 129 mmol/L — ABNORMAL LOW (ref 135–145)

## 2023-05-11 LAB — GLUCOSE, CAPILLARY
Glucose-Capillary: 218 mg/dL — ABNORMAL HIGH (ref 70–99)
Glucose-Capillary: 129 mg/dL — ABNORMAL HIGH (ref 70–99)
Glucose-Capillary: 163 mg/dL — ABNORMAL HIGH (ref 70–99)
Glucose-Capillary: 142 mg/dL — ABNORMAL HIGH (ref 70–99)

## 2023-05-11 MED ORDER — POLYETHYLENE GLYCOL 3350 17 G PO PACK: 17.0000 g | PACK | Freq: Every day | ORAL | 0 refills | Status: AC | PRN

## 2023-05-11 MED ORDER — ESOMEPRAZOLE MAGNESIUM 40 MG PO CPDR: 40.0000 mg | DELAYED_RELEASE_CAPSULE | Freq: Every day | ORAL | Status: AC | PRN

## 2023-05-11 MED ORDER — SODIUM CHLORIDE 0.9 % IV SOLN: 2.0000 g | INTRAVENOUS | Status: DC

## 2023-05-11 MED ORDER — SERTRALINE HCL 25 MG PO TABS: 25.0000 mg | ORAL_TABLET | Freq: Every day | ORAL | Status: DC

## 2023-05-11 MED ORDER — ALBUMIN HUMAN 25 % IV SOLN: 25.0000 g | Freq: Once | INTRAVENOUS | Status: DC

## 2023-05-11 MED ORDER — ALBUMIN HUMAN 25 % IV SOLN
25.0000 g | Freq: Once | INTRAVENOUS | Status: AC
  Administered 2023-05-11: 25 g via INTRAVENOUS
  Filled 2023-05-11: qty 100

## 2023-05-11 MED ORDER — MIDODRINE HCL 10 MG PO TABS: 10.0000 mg | ORAL_TABLET | Freq: Three times a day (TID) | ORAL | Status: DC

## 2023-05-11 MED ORDER — MIDODRINE HCL 10 MG PO TABS: 10.0000 mg | ORAL_TABLET | ORAL | Status: DC | PRN

## 2023-05-11 MED ORDER — RENA-VITE PO TABS: 1.0000 | ORAL_TABLET | Freq: Every day | ORAL | Status: AC

## 2023-05-11 NOTE — Plan of Care (Signed)
   Problem: Coping: Goal: Ability to adjust to condition or change in health will improve Outcome: Progressing   Problem: Metabolic: Goal: Ability to maintain appropriate glucose levels will improve Outcome: Progressing   Problem: Nutritional: Goal: Maintenance of adequate nutrition will improve Outcome: Progressing   Problem: Skin Integrity: Goal: Risk for impaired skin integrity will decrease Outcome: Progressing   Problem: Tissue Perfusion: Goal: Adequacy of tissue perfusion will improve Outcome: Progressing

## 2023-05-11 NOTE — Assessment & Plan Note (Signed)
Stable, pending discharge to SNF. - Nephrology following, appreciate recommendations - Continue Midodrine 10mg  TID and with HD - RFP Monday

## 2023-05-11 NOTE — Assessment & Plan Note (Signed)
Foley to remain in place until outpatient follow-up, continue with 14-day total course of antibiotics.  Stable. - Completed abx: Zosyn (1/24-1/27), Cefepime (1/27-1/30) - Continue Cefepime with HD (1/31-2/5), confirmed with HD center

## 2023-05-11 NOTE — Progress Notes (Signed)
PT Cancellation Note  Patient Details Name: Louis Butler MRN: [id number] DOB: [birth year]   Cancelled Treatment:    Reason Eval/Treat Not Completed: Patient declined, no reason specified. Pt reports recently mobilizing to the bathroom with RN assistance and having a bad day overall after dialysis, declines PT treatment at this time. PT will follow up tomorrow.   Arlyss Gandy 05/11/2023, 3:31 PM

## 2023-05-11 NOTE — Progress Notes (Addendum)
Received consult on behalf of patient. Arrived to room to find pt eating dinner. Pt asked if IV could be placed once he was done. Assessed R arm only due to L arm restriction. Attempted x1 in R forearm without success. Pt refused another attempt by this RN stating he wants "someone who knows what they are doing" This Rn assured him another member of the IV team would be around to assess him. Primary RN aware

## 2023-05-11 NOTE — Plan of Care (Signed)

## 2023-05-11 NOTE — Procedures (Signed)
Patient seen on Hemodialysis. BP (!) 117/54   Pulse 82   Temp 97.8 F (36.6 C) (Oral)   Resp 17   Ht 6\' 3"  (1.905 m)   Wt 85.4 kg Comment: Bed-1 pillow 1 blanket  SpO2 98%   BMI 23.53 kg/m   QB 400, UF goal 1.5L Tolerating treatment without complaints at this time.   Zetta Bills MD [facility]. Office # [phone] Pager # [phone] 8:43 AM

## 2023-05-11 NOTE — Assessment & Plan Note (Signed)
Stable.  Hemoglobin 7.8.  Transfusion threshold <7. - Monitor with CBC weekly. - Recommend OP follow-up

## 2023-05-11 NOTE — Progress Notes (Signed)
D/C order noted. Discussed with CSW. SNF [facility] is still pending. Contacted renal NP to be advised of clinic's need for orders if pt d/c today. HD arrangements placed on AVS as well. Will assist as needed.   Olivia Canter Renal Navigator [phone]

## 2023-05-11 NOTE — Assessment & Plan Note (Signed)
Stable. - Follow-up with [facility] - Scheduled tylenol 1000mg  q6h for pain

## 2023-05-11 NOTE — Progress Notes (Signed)
     Daily Progress Note Intern Pager: [phone]  Patient name: Louis Butler Medical record number: [id number] Date of birth: 04-07-1956 Age: 68 y.o. Gender: male  Primary Care Provider: Latrelle Dodrill, MD Consultants: Nephrology, CCM Code Status: Full  Pt Overview and Major Events to Date:  1/22: Admitted to ICU with CRRT 1/26: FMTS resumed care  Assessment and Plan:  Is a 68 year old male awaiting discharge to SNF now that he is medically stable after undergoing CRRT and restarting hemodialysis due to progression of ESRD.  Pertinent PMH includes ESRD on HD, T2DM, hypertension, SCC of head and neck, anemia. Assessment & Plan ESRD (end stage renal disease) (HCC) Stable, pending discharge to SNF. - Nephrology following, appreciate recommendations - Continue Midodrine 10mg  TID and with HD - RFP Monday Urinary tract infection  Bilateral Hydronephrosis Foley to remain in place until outpatient follow-up, continue with 14-day total course of antibiotics.  Stable. - Completed abx: Zosyn (1/24-1/27), Cefepime (1/27-1/30) - Continue Cefepime with HD (1/31-2/5), confirmed with HD center Orthostatic hypotension Stable. 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 - Follow-up with [facility] - Scheduled tylenol 1000mg  q6h for pain Anemia Stable.  Hemoglobin 7.8.  Transfusion threshold <7. - Monitor with CBC weekly. - Recommend OP follow-up Depressed mood Depressed mood recently. Sertraline 25mg  started while inpatient. - Continue Sertraline 25 mg daily Ulcer of sacral region, stage 2 (HCC) - WOC, frequent turns - RD recs Malnutrition of moderate degree RD following  Chronic and Stable Problems:  Pretension: Home medications held secondary to soft pressures, recommend resuming with outpatient follow-up. GERD: Protonix 40 mg daily T2DM: VS SSI.  A1c 6.8.   Return to 6 units of Lantus daily when discharged   FEN/GI: Regular PPx: Heparin Dispo:SNF in 2-3 days. Barriers include insurance authorization.   Subjective:  No complaints other than intermittent neck pain related to his ear canal cancer.  Objective: Temp:  [97.6 F (36.4 C)-98.4 F (36.9 C)] 98.2 F (36.8 C) (02/03 1234) Pulse Rate:  [63-90] 90 (02/03 1242) Resp:  [5-24] 14 (02/03 1242) BP: (57-155)/(36-78) 155/65 (02/03 1242) SpO2:  [92 %-100 %] 98 % (02/03 1242) Weight:  [83.8 kg-85.7 kg] 85.7 kg (02/03 1254) Physical Exam: General: Awake and alert, NAD Cardiovascular: RRR, no M/R/G Respiratory: CTAB, no increased work of breathing Abdomen: Flat, soft, nontender Extremities: 2+ pulses x 4, no swelling of the BLEs  Laboratory: Most recent CBC Lab Results  Component Value Date   WBC 4.4 05/11/2023   HGB 7.8 (L) 05/11/2023   HCT 25.1 (L) 05/11/2023   MCV 97.7 05/11/2023   PLT 180 05/11/2023   Most recent BMP    Latest Ref Rng & Units 05/11/2023    2:22 AM  BMP  Glucose 70 - 99 mg/dL 96   BUN 8 - 23 mg/dL 49   Creatinine 1.47 - 1.24 mg/dL 8.29   Sodium 562 - 130 mmol/L 129   Potassium 3.5 - 5.1 mmol/L 4.9   Chloride 98 - 111 mmol/L 96   CO2 22 - 32 mmol/L 21   Calcium 8.9 - 10.3 mg/dL 7.1    Gerrit Heck, DO 05/11/2023, 2:07 PM  PGY-1, [facility] FPTS Intern pager: [phone], text pages welcome Secure chat group [facility] Teaching Service

## 2023-05-11 NOTE — Assessment & Plan Note (Signed)
-  WOC, frequent turns -RD recs

## 2023-05-11 NOTE — Assessment & Plan Note (Signed)
Stable. - Abdominal binders, compression stockings - Holding home Amlodipine, Coreg - Target MAP >60

## 2023-05-11 NOTE — Discharge Summary (Cosign Needed)
Family Medicine Teaching [facility] Discharge Summary  Patient name: STEFFAN CANIGLIA Medical record number: [id number] Date of birth: 1955-07-14 Age: 68 y.o. 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  Acute on Chronic Renal Failure  Electrolyte disturbance Patient was started on CRRT for renal failure, had great improvement of electrolyte disturbances when this was initiated. Tolerated transition to intermittent HD well in hospital. LUE AVF performed on 1/28. CLIP process initiated and was continued on MWF schedule at [facility].  Hypotension Intermittently hypotensive after starting HD.  Was continued on midodrine 10 three times daily to improve pressures.  Altered Mental Status Patient was encephalopathic on admission, suspected to be secondary to uremia and hyperkalemia - resolved with initiation of dialysis.  At time of discharge patient was alert and oriented x 4 and at baseline.  Bilateral  Hydronephrosis obstructive uropathy  pseudomonal UTI S/p Zosyn (1/24-1/27) with transition to Cefepime (1/27-1/30) in the hospital.  Per urology recommendations they recommended keeping Foley in place until outpatient follow-up (not producing much urine) and 14-day abx course to complete with Cefepime with HD to end 2/5.  Anemia Hemoglobin remained around 7-8 while in the hospital.  Thought to be related to anemia of chronic disease.  Was given ESA. 1 U pRBC given for hgb 6.7 on 1/29 likely from blood draws.  Other chronic conditions were medically managed with home medications and formulary alternatives as necessary (GERD)  Follow-up recommendations Blood pressure medication held due to soft pressures.  Additionally, midodrine started for hypotension. Consider discontinuation of midodrine and addition of blood pressure medication if pressures are elevated. Please assess compliance of antibiotic regimen and ensure follow-up with urology for pseudomonal UTI and bilateral hydronephrosis.  Do not remove Foley catheter unless discussed with urology first. Please ensure follow-up with [facility] Stage II sacral ulcer, monitor and refer to wound care clinic if necessary  Disposition: SNF  Discharge Condition: Improved  Discharge Exam:  Vitals:   05/11/23 1100 05/11/23 1130  BP: (!) 115/52 (!) 124/58  Pulse: 82 80  Resp: 14 14  Temp:    SpO2: 96% 96%   General: A&O, NAD HEENT: No sign of trauma, EOM grossly intact Cardiac: RRR, no m/r/g Respiratory: CTAB, normal WOB, no w/c/r GI: Soft, NTTP, non-distended  Extremities: NTTP, no peripheral edema.  Significant Procedures: HD  Significant Labs and Imaging:  Recent Labs  Lab 05/10/23 0338 05/11/23 0222  WBC  --  4.4  HGB 8.0* 7.8*  HCT 25.2* 25.1*  PLT  --  180   Recent Labs  Lab 05/11/23 0222  NA 129*  K 4.9  CL 96*  CO2 21*  GLUCOSE 96  BUN 49*  CREATININE 7.57*  CALCIUM 7.1*  PHOS 2.6  ALBUMIN 2.2*    Results/Tests Pending at Time of Discharge: None  Discharge Medications:  Allergies as of 05/11/2023       Reactions   Hydrocodone Hives, Itching   Percocet [oxycodone-acetaminophen] Hives, Itching        Medication List     STOP taking these medications    amLODipine 5 MG tablet Commonly known as: NORVASC   carvedilol 25 MG tablet Commonly known as: COREG   traMADol 50 MG tablet Commonly known as: Ultram   trimethoprim 100 MG tablet Commonly known as: TRIMPEX       TAKE these medications    acetaminophen 500 MG tablet Commonly known as: TYLENOL Take 1,000 mg by mouth every 6 (six) hours as needed for headache (pain).   blood glucose meter kit and supplies Kit Dispense based on patient and insurance preference. Use up to four times daily as directed. (FOR ICD-9 250.00, 250.01).   ceFEPIme 2 g in sodium chloride 0.9 % 100 mL Inject 2 g into the vein every Monday, Wednesday, and Friday with hemodialysis for 2 days.   esomeprazole 40 MG capsule Commonly known as: NEXIUM Take 1 capsule (40 mg total) by mouth daily as needed (for heartburn). What changed: See the new instructions.   gabapentin 300 MG capsule Commonly known as: NEURONTIN Take 1 capsule (300 mg total) by mouth at bedtime.   Insulin Syringes (Disposable) U-100 0.5 ML Misc Use as per instructions 3 times daily AC.   Lantus SoloStar 100 UNIT/ML Solostar Pen Generic drug: insulin glargine Inject 6 units subcutaneously once daily   loperamide 2 MG tablet Commonly known as: IMODIUM A-D Take 1 tablet (2 mg total) by mouth 4 (four) times daily as needed for diarrhea or loose stools.   midodrine 10 MG tablet Commonly known as: PROAMATINE Take 1 tablet (10 mg total) by mouth 3 (three) times daily with meals.   midodrine 10 MG tablet Commonly known as: PROAMATINE Take 1 tablet (10 mg total) by mouth every dialysis (give 15-30 minutes before dialysis treatments).   multivitamin Tabs tablet Take  1 tablet by mouth at bedtime.   mupirocin ointment 2 % Commonly known as: BACTROBAN Apply 1 Application topically 2 (two) times daily. Left ear canal   polyethylene glycol 17 g packet Commonly known as: MIRALAX / GLYCOLAX Take 17 g by mouth daily as needed for moderate constipation.   sertraline 25 MG tablet Commonly known as: ZOLOFT Take 1 tablet (25 mg total) by mouth daily.   sodium bicarbonate 650 MG tablet Take 650 mg by mouth 2 (two) times daily.   tamsulosin 0.4 MG Caps capsule Commonly known as: FLOMAX Take 0.4 mg by mouth daily.   Veltassa 8.4 g packet Generic drug: patiromer Take 1 packet by mouth daily.   Vitamin D3 125 MCG (5000 UT) Caps Take 5,000 Units by mouth daily.               Durable Medical Equipment  (From admission, onward)           Start     Ordered   05/04/23 1653  For home use only DME Hospital bed  Once       Comments: Patient requires frequent changes in body position ways not feasible with a regular bed.  Question Answer Comment  Length of  Need Lifetime   The above medical condition requires: Patient requires the ability to reposition frequently   Head must be elevated greater than: 45 degrees   Bed type Semi-electric   Support Surface: Gel Overlay      05/04/23 1659   05/04/23 1642  For home use only DME Overbed table  Once        05/04/23 1641            Discharge Instructions: Please refer to Patient Instructions section of EMR for full details.  Patient was counseled important signs and symptoms that should prompt return to medical care, changes in medications, dietary instructions, activity restrictions, and follow up appointments.   Follow-Up Appointments:  Contact information for follow-up providers     [facility] Follow up.   Specialty: Home Health Services Why: Someone will call you to schedule first home visit. Contact information: [address] [place] [address] [phone]          [facility]. Go on 05/13/2023.   Why: Schedule is Monday, Wednesday, Friday with 12:25 pm chair time.  On Wednesday (2/5), please arrive at 11:30 am to complete paperwork prior to treatment. Contact information: [address] [place] [address] [phone]              Contact information for after-discharge care     Destination     [facility] Preferred SNF .   Service: Skilled Nursing Contact information: [address] [place] [address] [phone]                     Gerrit Heck, DO 05/11/2023, 11:52 AM PGY-1, [facility]

## 2023-05-11 NOTE — Assessment & Plan Note (Signed)
-   RD following

## 2023-05-11 NOTE — Progress Notes (Signed)
Received patient in bed to unit.  Alert and oriented.  Informed consent signed and in chart.   TX duration: 3 hours and 45 minutes  Patient tolerated well.  Transported back to the room  Alert, without acute distress.  Hand-off given to patient's nurse.   Access used: Left AV fistula Access issues: none  Total UF removed: - Medication(s) given: midodrine   05/11/23 1234  Vitals  Temp 98.2 F (36.8 C)  BP (!) 146/56  Pulse Rate 84  Resp 13  Oxygen Therapy  SpO2 97 %  O2 Device Room Air  Patient Activity (if Appropriate) In bed  Pulse Oximetry Type Continuous  During Treatment Monitoring  Duration of HD Treatment -hour(s) 3.75 hour(s)  Cumulative Fluid Removed (mL) per Treatment  -311.77  HD Safety Checks Performed Yes  Intra-Hemodialysis Comments Tx completed  Dialysis Fluid Bolus Normal Saline  Bolus Amount (mL) 300 mL  Post Treatment  Dialyzer Clearance Lightly streaked  Liters Processed 67.5  Fluid Removed (mL) -300 mL (Due to low BP in beginning of dialysis session, gave 300 cc bolus)  Tolerated HD Treatment Yes  Post-Hemodialysis Comments Due to low BP in beginning of dialysis session, gave 300 cc bolus  AVG/AVF Arterial Site Held (minutes) 7 minutes  AVG/AVF Venous Site Held (minutes) 7 minutes  Fistula / Graft Left Forearm Arteriovenous fistula  Placement Date/Time: 11/18/19 0847   Orientation: Left  Access Location: Forearm  Access Type: Arteriovenous fistula  Status Deaccessed     Stacie Glaze LPN Kidney Dialysis Unit

## 2023-05-11 NOTE — Assessment & Plan Note (Signed)
Depressed mood recently. Sertraline 25mg  started while inpatient. - Continue Sertraline 25 mg daily

## 2023-05-11 NOTE — Discharge Summary (Incomplete Revision)
Family Medicine Teaching [facility] Discharge Summary  Patient name: Louis Butler Medical record number: [id number] Date of birth: 1955-07-14 Age: 68 y.o. 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  Acute on Chronic Renal Failure  Electrolyte disturbance Patient was started on CRRT for renal failure, had great improvement of electrolyte disturbances when this was initiated. Tolerated transition to intermittent HD well in hospital. LUE AVF performed on 1/28. CLIP process initiated and was continued on MWF schedule at [facility].  Hypotension Intermittently hypotensive after starting HD.  Was continued on midodrine 10 three times daily to improve pressures.  Altered Mental Status Patient was encephalopathic on admission, suspected to be secondary to uremia and hyperkalemia - resolved with initiation of dialysis.  At time of discharge patient was alert and oriented x 4 and at baseline.  Bilateral  Hydronephrosis obstructive uropathy  pseudomonal UTI S/p Zosyn (1/24-1/27) with transition to Cefepime (1/27-1/30) in the hospital.  Per urology recommendations they recommended keeping Foley in place until outpatient follow-up (not producing much urine) and 14-day abx course to complete with Cefepime with HD to end 2/5.  Anemia Hemoglobin remained around 7-8 while in the hospital.  Thought to be related to anemia of chronic disease.  Was given ESA. 1 U pRBC given for hgb 6.7 on 1/29 likely from blood draws.  Other chronic conditions were medically managed with home medications and formulary alternatives as necessary (GERD)  Follow-up recommendations Blood pressure medication held due to soft pressures.  Additionally, midodrine started for hypotension. Consider discontinuation of midodrine and addition of blood pressure medication if pressures are elevated. Please assess compliance of antibiotic regimen and ensure follow-up with urology for pseudomonal UTI and bilateral hydronephrosis.  Do not remove Foley catheter unless discussed with urology first. Please ensure follow-up with [facility] Stage II sacral ulcer, monitor and refer to wound care clinic if necessary  Disposition: SNF  Discharge Condition: Improved  Discharge Exam:  Vitals:   05/11/23 1100 05/11/23 1130  BP: (!) 115/52 (!) 124/58  Pulse: 82 80  Resp: 14 14  Temp:    SpO2: 96% 96%   General: A&O, NAD HEENT: No sign of trauma, EOM grossly intact Cardiac: RRR, no m/r/g Respiratory: CTAB, normal WOB, no w/c/r GI: Soft, NTTP, non-distended  Extremities: NTTP, no peripheral edema.  Significant Procedures: HD  Significant Labs and Imaging:  Recent Labs  Lab 05/10/23 0338 05/11/23 0222  WBC  --  4.4  HGB 8.0* 7.8*  HCT 25.2* 25.1*  PLT  --  180   Recent Labs  Lab 05/11/23 0222  NA 129*  K 4.9  CL 96*  CO2 21*  GLUCOSE 96  BUN 49*  CREATININE 7.57*  CALCIUM 7.1*  PHOS 2.6  ALBUMIN 2.2*    Results/Tests Pending at Time of Discharge: None  Discharge Medications:  Allergies as of 05/11/2023       Reactions   Hydrocodone Hives, Itching   Percocet [oxycodone-acetaminophen] Hives, Itching        Medication List     STOP taking these medications    amLODipine 5 MG tablet Commonly known as: NORVASC   carvedilol 25 MG tablet Commonly known as: COREG   traMADol 50 MG tablet Commonly known as: Ultram   trimethoprim 100 MG tablet Commonly known as: TRIMPEX       TAKE these medications    acetaminophen 500 MG tablet Commonly known as: TYLENOL Take 1,000 mg by mouth every 6 (six) hours as needed for headache (pain).   blood glucose meter kit and supplies Kit Dispense based on patient and insurance preference. Use up to four times daily as directed. (FOR ICD-9 250.00, 250.01).   ceFEPIme 2 g in sodium chloride 0.9 % 100 mL Inject 2 g into the vein every Monday, Wednesday, and Friday with hemodialysis for 2 days.   esomeprazole 40 MG capsule Commonly known as: NEXIUM Take 1 capsule (40 mg total) by mouth daily as needed (for heartburn). What changed: See the new instructions.   gabapentin 300 MG capsule Commonly known as: NEURONTIN Take 1 capsule (300 mg total) by mouth at bedtime.   Insulin Syringes (Disposable) U-100 0.5 ML Misc Use as per instructions 3 times daily AC.   Lantus SoloStar 100 UNIT/ML Solostar Pen Generic drug: insulin glargine Inject 6 units subcutaneously once daily   loperamide 2 MG tablet Commonly known as: IMODIUM A-D Take 1 tablet (2 mg total) by mouth 4 (four) times daily as needed for diarrhea or loose stools.   midodrine 10 MG tablet Commonly known as: PROAMATINE Take 1 tablet (10 mg total) by mouth 3 (three) times daily with meals.   midodrine 10 MG tablet Commonly known as: PROAMATINE Take 1 tablet (10 mg total) by mouth every dialysis (give 15-30 minutes before dialysis treatments).   multivitamin Tabs tablet Take  1 tablet by mouth at bedtime.   mupirocin ointment 2 % Commonly known as: BACTROBAN Apply 1 Application topically 2 (two) times daily. Left ear canal   polyethylene glycol 17 g packet Commonly known as: MIRALAX / GLYCOLAX Take 17 g by mouth daily as needed for moderate constipation.   sertraline 25 MG tablet Commonly known as: ZOLOFT Take 1 tablet (25 mg total) by mouth daily.   sodium bicarbonate 650 MG tablet Take 650 mg by mouth 2 (two) times daily.   tamsulosin 0.4 MG Caps capsule Commonly known as: FLOMAX Take 0.4 mg by mouth daily.   Veltassa 8.4 g packet Generic drug: patiromer Take 1 packet by mouth daily.   Vitamin D3 125 MCG (5000 UT) Caps Take 5,000 Units by mouth daily.               Durable Medical Equipment  (From admission, onward)           Start     Ordered   05/04/23 1653  For home use only DME Hospital bed  Once       Comments: Patient requires frequent changes in body position ways not feasible with a regular bed.  Question Answer Comment  Length of  Need Lifetime   The above medical condition requires: Patient requires the ability to reposition frequently   Head must be elevated greater than: 45 degrees   Bed type Semi-electric   Support Surface: Gel Overlay      05/04/23 1659   05/04/23 1642  For home use only DME Overbed table  Once        05/04/23 1641            Discharge Instructions: Please refer to Patient Instructions section of EMR for full details.  Patient was counseled important signs and symptoms that should prompt return to medical care, changes in medications, dietary instructions, activity restrictions, and follow up appointments.   Follow-Up Appointments:  Contact information for follow-up providers     [facility] Follow up.   Specialty: Home Health Services Why: Someone will call you to schedule first home visit. Contact information: [address] [place] [address] [phone]          [facility]. Go on 05/13/2023.   Why: Schedule is Monday, Wednesday, Friday with 12:25 pm chair time.  On Wednesday (2/5), please arrive at 11:30 am to complete paperwork prior to treatment. Contact information: [address] [place] [address] [phone]              Contact information for after-discharge care     Destination     [facility] Preferred SNF .   Service: Skilled Nursing Contact information: [address] [place] [address] [phone]                     Gerrit Heck, DO 05/11/2023, 11:52 AM PGY-1, [facility]

## 2023-05-11 NOTE — TOC Progression Note (Addendum)
Transition of Care [facility]) - Progression Note    Patient Details  Name: Louis Butler MRN: [id number] Date of Birth: [birth year]  Transition of Care [facility]) CM/SW Contact  Erin Sons, [place] Phone Number: 05/11/2023, 9:06 AM  Clinical Narrative:     SNF auth still pending at this time. Plan to DC to [facility] once Berkley Harvey is approved.   1046: CSW called [facility] to check on SNF auth. CSW is informed that [facility] Harvey has gone to Wellsite geologist for review. CSW informed that [facility] will go to peer review though peer to peer details have not been generated yet. CSW's contact info has been added to [place]. They will call CSW with update.   1337: CSW called [facility] and is informed [facility] is still awaiting review by Wellsite geologist.   1600: Received call fro [facility]. Informed that [facility] has gone to peer to peer. Physician would need to call [phone] and select option 5. Peer to peer is due by 11am tomorrow 2/4. CSW notified attending team and provided pt's member ID that will be needed for the p2p.   Expected Discharge Plan: SNF Barriers to Discharge: Continued Medical Work up insurance auth  Expected Discharge Plan and Services   Discharge Planning Services: CM Consult Post Acute Care Choice: Home Health Living arrangements for the past 2 months: Single Family Home                 DME Arranged: Hospital bed DME Agency: [employer] Date DME Agency Contacted: 05/04/23 Time DME Agency Contacted: [phone] Representative spoke with at DME Agency: Earna Coder HH Arranged: PT, Nurse's Aide HH Agency: [facility] Date [facility] Agency Contacted: 05/04/23 Time HH Agency Contacted: 1636 Representative spoke with at [facility] Agency: Amy   Social Determinants of Health (SDOH) Interventions SDOH Screenings   Food Insecurity: No Food Insecurity (05/08/2023)  Housing: Low Risk  (05/08/2023)  Transportation Needs: No Transportation  Needs (05/08/2023)  Utilities: Not At Risk (05/08/2023)  Alcohol Screen: Low Risk  (12/01/2022)  Depression (PHQ2-9): Medium Risk (12/24/2022)  Financial Resource Strain: Low Risk  (12/01/2022)  Physical Activity: Insufficiently Active (12/01/2022)  Social Connections: Moderately Integrated (05/08/2023)  Stress: Stress Concern Present (12/01/2022)  Tobacco Use: Medium Risk (04/29/2023)  Health Literacy: Adequate Health Literacy (12/01/2022)    Readmission Risk Interventions    05/04/2023    4:35 PM  Readmission Risk Prevention Plan  Transportation Screening Complete  HRI or Home Care Consult Complete  Social Work Consult for Recovery Care Planning/Counseling Complete  Palliative Care Screening Not Applicable  Medication Review Oceanographer) Referral to Pharmacy

## 2023-05-11 NOTE — Plan of Care (Signed)
Called peer to peer line regarding insurance approval of SNF.  Spoke with medical director who relayed additional PT information is needed regarding out of bed mobility and condition of orthostasis.  Requesting updated PT evaluation since last note was from 1/30.  Reach out to PT regarding additional evaluation, they will prioritize him tomorrow morning prior to deadline.  Will have social work forward additional documentation if appropriate at that time.  Elberta Fortis, MD 05/11/2023, 4:18 PM PGY-2, [facility] Family Medicine Service pager [phone]

## 2023-05-12 ENCOUNTER — Encounter (HOSPITAL_COMMUNITY): Payer: Self-pay | Admitting: Pulmonary Disease

## 2023-05-12 DIAGNOSIS — I951 Orthostatic hypotension: Secondary | ICD-10-CM

## 2023-05-12 LAB — GLUCOSE, CAPILLARY
Glucose-Capillary: 115 mg/dL — ABNORMAL HIGH (ref 70–99)
Glucose-Capillary: 127 mg/dL — ABNORMAL HIGH (ref 70–99)
Glucose-Capillary: 174 mg/dL — ABNORMAL HIGH (ref 70–99)
Glucose-Capillary: 123 mg/dL — ABNORMAL HIGH (ref 70–99)
Glucose-Capillary: 148 mg/dL — ABNORMAL HIGH (ref 70–99)

## 2023-05-12 NOTE — Assessment & Plan Note (Addendum)
-  WOC, frequent turns -RD recs

## 2023-05-12 NOTE — Progress Notes (Signed)
 Occupational Therapy Treatment Patient Details Name: Louis Butler MRN: [id number] DOB: Jul 07, 1955 Today's Date: 05/12/2023   History of present illness 67 y.o. male presented 1/22 with N/V/abd pain and was found to have marked hyperkalemia and renal failure. CRRT 1/23-1/24. 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 Tobie SERUM), DM, HTN, recent squamous cell ca H&N (surgery, XRT 2024), anemia.   OT comments  Pt progressing towards goals, limited by orthostatic BP this session (see below). Pt mod A for donning abdominal binder, and min A for transfers to chair with 1-2 person HHA. Pt able to stand x3 mins for BP readings but then reports dizziness and needing to sit. Pt presenting with impairments listed below, will follow acutely. Patient will benefit from continued inpatient follow up therapy, <3 hours/day to maximize safety/ind with ADL/functional mobility.  BP seated 100/58 (73) BP standing 79/48 (59) BP seated in chair 148/62 (89)       If plan is discharge home, recommend the following:  A little help with walking and/or transfers;A little help with bathing/dressing/bathroom;Assistance with cooking/housework;Assist for transportation;Help with stairs or ramp for entrance   Equipment Recommendations  Wheelchair (measurements OT);Wheelchair cushion (measurements OT);Other (comment) (mattress overlay)    Recommendations for Other Services      Precautions / Restrictions Precautions Precautions: Fall Precaution Comments: Monitor BP. Orthostatic - has abd binder Restrictions Weight Bearing Restrictions Per Provider Order: No       Mobility Bed Mobility Overal bed mobility: Needs Assistance Bed Mobility: Supine to Sit     Supine to sit: Supervision          Transfers Overall transfer level: Needs assistance Equipment used: 2 person hand held assist, 1 person hand held assist Transfers: Sit to/from Stand Sit to Stand: Contact guard assist                 Balance Overall balance  assessment: Needs assistance Sitting-balance support: No upper extremity supported, Feet supported Sitting balance-Leahy Scale: Good     Standing balance support: No upper extremity supported Standing balance-Leahy Scale: Fair Standing balance comment: More stable with assistive device.                           ADL either performed or assessed with clinical judgement   ADL Overall ADL's : Needs assistance/impaired                 Upper Body Dressing : Moderate assistance Upper Body Dressing Details (indicate cue type and reason): donning abd binder     Toilet Transfer: Minimal assistance;+2 for safety/equipment;Stand-pivot;BSC/3in1 Toilet Transfer Details (indicate cue type and reason): simulated to chair Toileting- Clothing Manipulation and Hygiene: Total assistance Toileting - Clothing Manipulation Details (indicate cue type and reason): catheter     Functional mobility during ADLs: Minimal assistance;+2 for safety/equipment      Extremity/Trunk Assessment Upper Extremity Assessment Upper Extremity Assessment: Overall WFL for tasks assessed   Lower Extremity Assessment Lower Extremity Assessment: Defer to PT evaluation        Vision       Perception Perception Perception: Not tested   Praxis Praxis Praxis: Not tested    Cognition Arousal: Alert Behavior During Therapy: [facility] for tasks assessed/performed Overall Cognitive Status: Within Functional Limits for tasks assessed  Exercises      Shoulder Instructions       General Comments see note for orthostatic BP    Pertinent Vitals/ Pain       Pain Assessment Pain Assessment: No/denies pain  Home Living                                          Prior Functioning/Environment              Frequency  Min 1X/week        Progress Toward Goals  OT Goals(current goals can now be found in the care plan  section)  Progress towards OT goals: Progressing toward goals  Acute Rehab OT Goals OT Goal Formulation: With patient Time For Goal Achievement: 05/18/23 Potential to Achieve Goals: Good ADL Goals Pt Will Perform Grooming: with supervision;standing Pt Will Perform Lower Body Bathing: with supervision;sit to/from stand Pt Will Perform Lower Body Dressing: with supervision;sit to/from stand Pt Will Transfer to Toilet: with supervision;ambulating;bedside commode Pt Will Perform Toileting - Clothing Manipulation and hygiene: with supervision;sit to/from stand Additional ADL Goal #1: Pt will state at least 3 energy conservation strategies as instructed.  Plan      Co-evaluation                 AM-PAC OT 6 Clicks Daily Activity     Outcome Measure   Help from another person eating meals?: None Help from another person taking care of personal grooming?: A Little Help from another person toileting, which includes using toliet, bedpan, or urinal?: A Little Help from another person bathing (including washing, rinsing, drying)?: A Lot Help from another person to put on and taking off regular upper body clothing?: A Little Help from another person to put on and taking off regular lower body clothing?: A Little 6 Click Score: 18    End of Session Equipment Utilized During Treatment: Gait belt  OT Visit Diagnosis: Unsteadiness on feet (R26.81);Muscle weakness (generalized) (M62.81)   Activity Tolerance Patient tolerated treatment well   Patient Left in chair;with call bell/phone within reach;with family/visitor present;with chair alarm set   Nurse Communication Mobility status        Time: 8675-8653 OT Time Calculation (min): 22 min  Charges: OT General Charges $OT Visit: 1 Visit OT Treatments $Therapeutic Activity: 8-22 mins  Louis Butler, OTD, OTR/L SecureChat Preferred Acute Rehab [phone]   Louis Butler 05/12/2023, 2:13 PM

## 2023-05-12 NOTE — Progress Notes (Signed)
 Physical Therapy Treatment Patient Details Name: Louis Butler MRN: [id number] DOB: 1956/03/22 Today's Date: 05/12/2023   History of Present Illness 68 y.o. male presented 1/22 with N/V/abd pain and was found to have marked hyperkalemia and renal failure. CRRT 1/23-1/24. 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 Tobie SERUM), DM, HTN, recent squamous cell ca H&N (surgery, XRT 2024), anemia.    PT Comments  Remains limited by symptomatic orthostatic hypotension. Abdominal binder in place. Able to ambulate 45 feet with RW today but quickly becomes dizzy and fatigues, requiring pt to sit. CGA for transfer and gait. Further education for symptom awareness, LE exercises, and activity modification as needed to manage symptoms safely. Wife present. Remains appropriate for SNF due to limited tolerance with activity when BP drops, and increased fall risk. Wife does not feel comfortable assisting/supervision patient at home. Patient will continue to benefit from skilled physical therapy services to further improve independence with functional mobility.    05/12/23 0900  Orthostatic Lying   BP- Lying 131/65  Pulse- Lying 77  Orthostatic Sitting  BP- Sitting 105/85  Pulse- Sitting 91  Orthostatic Standing at 0 minutes  BP- Standing at 0 minutes (!) 85/51  Pulse- Standing at 0 minutes 106  Orthostatic Standing at 3 minutes  BP- Standing at 3 minutes  (Not tolerated.)        If plan is discharge home, recommend the following: A little help with walking and/or transfers;A little help with bathing/dressing/bathroom;Assistance with cooking/housework;Assist for transportation;Help with stairs or ramp for entrance   Can travel by private vehicle     Yes  Equipment Recommendations  None recommended by PT    Recommendations for Other Services       Precautions / Restrictions Precautions Precautions: Fall Precaution Comments: Monitor BP. Orthostatic Restrictions Weight Bearing Restrictions Per Provider Order: No      Mobility  Bed Mobility Overal bed mobility: Needs Assistance Bed Mobility: Supine to Sit     Supine to sit: Supervision     General bed mobility comments: Supervision for safety, extra time, no physical assist needed    Transfers Overall transfer level: Needs assistance Equipment used: Rolling walker (2 wheels) Transfers: Sit to/from Stand Sit to Stand: Contact guard assist           General transfer comment: CGA for safety from bed, toilet and recliner x2. Slower to rise with mild instability upon rising. Declines RW. Becomes dizzy <30 seconds. Pale. BP drops see orthostatics.    Ambulation/Gait Ambulation/Gait assistance: Contact guard assist Gait Distance (Feet): 45 Feet Assistive device: Rolling walker (2 wheels) Gait Pattern/deviations: Step-through pattern, Decreased stride length, Trunk flexed Gait velocity: dec Gait velocity interpretation: <1.31 ft/sec, indicative of household ambulator   General Gait Details: Educated on safe AD use with RW for support, moderate reliance but without overt buckling. Becomes dizzy quickly after standing but able to ambulate short distance before needing to sit due to weakness and dizziness. Abdominal binder was donned. Performed second short distance into and out of bathroom at [facility] level. Become very dizzy quickly.   Stairs             Wheelchair Mobility     Tilt Bed    Modified Rankin (Stroke Patients Only)       Balance Overall balance assessment: Needs assistance Sitting-balance support: No upper extremity supported, Feet supported Sitting balance-Leahy Scale: Good     Standing balance support: No upper extremity supported Standing balance-Leahy Scale: Fair Standing balance comment: More stable with assistive  device.                            Cognition Arousal: Alert Behavior During Therapy: WFL for tasks assessed/performed Overall Cognitive Status: Within Functional Limits for tasks  assessed                                          Exercises General Exercises - Lower Extremity Ankle Circles/Pumps: AROM, Both, 10 reps, Supine Quad Sets: Strengthening, Both, 10 reps, Supine Gluteal Sets: Strengthening, Both, 10 reps, Supine    General Comments General comments (skin integrity, edema, etc.): See orthostatics tab. Pt remains limited by hypotension while standing.      Pertinent Vitals/Pain Pain Assessment Pain Assessment: No/denies pain    Home Living                          Prior Function            PT Goals (current goals can now be found in the care plan section) Acute Rehab PT Goals Patient Stated Goal: Get well. PT Goal Formulation: With patient Time For Goal Achievement: 05/22/23 Potential to Achieve Goals: Fair Progress towards PT goals: Progressing toward goals    Frequency    Min 1X/week      PT Plan      Co-evaluation              AM-PAC PT 6 Clicks Mobility   Outcome Measure  Help needed turning from your back to your side while in a flat bed without using bedrails?: None Help needed moving from lying on your back to sitting on the side of a flat bed without using bedrails?: None Help needed moving to and from a bed to a chair (including a wheelchair)?: A Little Help needed standing up from a chair using your arms (e.g., wheelchair or bedside chair)?: A Little Help needed to walk in hospital room?: A Little Help needed climbing 3-5 steps with a railing? : Total 6 Click Score: 18    End of Session Equipment Utilized During Treatment: Gait belt Activity Tolerance: Treatment limited secondary to medical complications (Comment) (Drop in BP.) Patient left: in chair;with call bell/phone within reach;with chair alarm set;with family/visitor present Nurse Communication: Mobility status (BP orthostatics) PT Visit Diagnosis: Unsteadiness on feet (R26.81);Muscle weakness (generalized)  (M62.81);Difficulty in walking, not elsewhere classified (R26.2);Dizziness and giddiness (R42)     Time: 9151-9089 PT Time Calculation (min) (ACUTE ONLY): 22 min  Charges:    $Therapeutic Activity: 8-22 mins PT General Charges $$ ACUTE PT VISIT: 1 Visit                     Louis Butler, PT, DPT [facility]  Rehabilitation Services Physical Therapist Office: [phone] Website: [email or web address]    Louis Butler 05/12/2023, 9:18 AM

## 2023-05-12 NOTE — Assessment & Plan Note (Addendum)
Stable. - Follow-up with [facility] - Scheduled tylenol 1000mg  q6h for pain

## 2023-05-12 NOTE — Progress Notes (Addendum)
 Daily Progress Note Intern Pager: [phone]  Patient name: Louis Butler Medical record number: [id number] Date of birth: [birth year] Age: 68 y.o. Gender: male  Primary Care Provider: Donah Laymon PARAS, MD Consultants: nephrology Code Status: full  Pt Overview and Major Events to Date:  1/22: Admitted to ICU with CRRT 1/26: FM TS resume care  Assessment and Plan:  Patient is a 68 year old male with underlying chronic kidney disease, stage V now back on dialysis.  Plans were for discharge today but this was precluded due to orthostatic hypotension..  Pertinent PMH includes ESRD on HD, T2DM, HTN, SCC of head and neck, and anemia. Assessment & Plan Orthostatic hypotension Stable.  This is the largest barrier to his discharge at this time.  Will contact nursing to ensure paced patient is wearing compressive clothing. - Abdominal binders, compression stockings -Will reach out to nursing to ensure that these are actually in place - Holding home Amlodipine , Coreg  - Target MAP >60  ESRD (end stage renal disease) (HCC) Stable, pending discharge to SNF or home pending clinical improvement.   - Nephrology following, appreciate recommendations - Continue Midodrine  10mg  TID and with HD - RFP Monday Urinary tract infection  Bilateral Hydronephrosis Foley to remain in place until outpatient follow-up, continue with 14-day total course of antibiotics.  Stable. 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 - Follow-up with [facility] - Scheduled tylenol  1000mg  q6h for pain Anemia Stable.  Hemoglobin 7.8.  Transfusion threshold <7. - Monitor with CBC weekly. - Recommend OP follow-up Depressed mood Depressed mood recently. Sertraline  25mg  started while inpatient. - Continue Sertraline  25 mg daily Ulcer of sacral region, stage 2 (HCC) - WOC,  frequent turns - RD recs Malnutrition of moderate degree RD following  Chronic and Stable Problems:  Hypertension: Holding home medications secondary to soft pressures and orthostasis GERD: Protonix  40 mg daily T2DM: Very sensitive sliding scale insulin  while hospitalized.  FEN/GI: Renal PPx: Heparin  Dispo:SNF or home pending clinical improvement . Barriers include orthostasis.   Subjective:  Patient is feeling well this morning sitting up talking with wife on exam today.  He is amenable to going home with home health PT if this would be an option to get him home sooner.  Objective: Temp:  [97.6 F (36.4 C)-98.7 F (37.1 C)] 97.7 F (36.5 C) (02/04 1134) Pulse Rate:  [63-116] 68 (02/04 1400) Resp:  [8-23] 14 (02/04 1400) BP: (88-161)/(41-71) 141/59 (02/04 1300) SpO2:  [95 %-100 %] 98 % (02/04 1400) Weight:  [87.2 kg] 87.2 kg (02/04 0500) Physical Exam: General: Well-appearing elderly gentleman no distress Cardiovascular: RRR, no M/R/G Respiratory: CTAB, no increased work of breathing Abdomen: Flat, soft, nontender Extremities: 2+ pulses in the bilateral upper extremity, no evidence of edema or swelling in the bilateral lower extremity  Laboratory: Most recent CBC Lab Results  Component Value Date   WBC 4.4 05/11/2023   HGB 7.8 (L) 05/11/2023   HCT 25.1 (L) 05/11/2023   MCV 97.7 05/11/2023   PLT 180 05/11/2023   Most recent BMP    Latest Ref Rng & Units 05/11/2023    2:22 AM  BMP  Glucose 70 - 99 mg/dL 96   BUN 8 - 23 mg/dL 49   Creatinine 9.38 - 1.24 mg/dL 2.42   Sodium 864 - 854 mmol/L 129   Potassium 3.5 - 5.1 mmol/L 4.9  Chloride 98 - 111 mmol/L 96   CO2 22 - 32 mmol/L 21   Calcium  8.9 - 10.3 mg/dL 7.1     Cleotilde Lukes, DO 05/12/2023, 2:57 PM  PGY-1, [facility] Family Medicine FPTS Intern pager: [phone], text pages welcome Secure chat group [facility] Teaching Service

## 2023-05-12 NOTE — Discharge Planning (Addendum)
 [facility]  Initial Hemodialysis Orders  Dialysis center: [facility]. Updated version as of 05/24/23. Still Awaiting SNF approval.  Patient's name: Louis Butler DOB: 1956-02-09 AKI or ESRD: ESRD  Past Medical History: CKD 5 (f/b Dr. Tobie SERUM), DM, HTN, recent squamous cell ca H&N (surgery, XRT 2024), anemia    Discharge diagnosis: New ESRD Orthostatic Hypotension - On Midodrine  and now Florinef  Bilateral Hydronephrosis obstructive uropathy  pseudomonal UTI - Per urology recommendations they recommended keeping Foley in place until outpatient follow-up (not producing much urine) and 14-day abx course to complete with Cefepime  with HD to end 2/5.  Depression - On Zoloft    Allergies:  Allergies  Allergen Reactions   Hydrocodone  Hives and Itching   Percocet [Oxycodone -Acetaminophen ] Hives and Itching    Date of First Dialysis: 04/29/23 Cause of renal disease: Likely HTN and DM. Longstanding advanced CKD, was on dialysis 2022 but stopped and has followed with Dr. Tobie since; most recent GFR 02/2023 11. By history seems patient may have had progression of CKD which led to his acute presentation though there is some concern for acute GI illness potentiating AKI.  Dialysis Prescription: Dialysis Frequency: MWF Tx duration: 4  BFR: 350 then 400  DFR: 500 then 600  EDW: 86kg  Dialyzer: 180NRe UF profile/Sodium modeling?: None Dialysis Bath: 2 K/2.5 Ca  Dialysis access: Access type: L AVF placed 01/04/20 by Dr. Serene Needle gauge: 15g  In Center Medications: Heparin  Dose: Hold for now. Notify renal team if having issues with clotting.  VDRA: Hold off on Calcitriol. Patient currently on nutritional Vitamin D3. PTH and Corr Ca are at goal Venofer : Per protocol Aranesp  last given on 05/18/23. Hold ESA at this time due to recent head/neck cancer-Per Dr. Gearline from 05/24/23.  Discharge labs: Hgb: 9.1  K+: 4.1     Ca: 8.3  Corr Ca 9.9    Phos: 4.7  Alb: 2.9  PTH: 203  Please draw routine labs. Additional labs needed: Perform monthly labs per facility protocol.  Additional notes/follow-up: We need to ensure patient is wearing compression stockings and ABD binder for ongoing hypotension. Patient is on Midodrine  and now Florinef .

## 2023-05-12 NOTE — Assessment & Plan Note (Addendum)
-   RD following

## 2023-05-12 NOTE — Progress Notes (Signed)
 Patient ID: Louis Butler, male   DOB: 04/29/1955, 68 y.o.   MRN: [id number] [facility] Progress Note   Assessment/ Plan:   1.  Deconditioning status post hospitalization: Awaiting reevaluation by physical therapy for discharge to skilled nursing facility; they were concerned about his orthostatic blood pressure drops and mobility.  He was started on an abdominal binder and is awaiting compression stockings. 2. ESRD: Continue hemodialysis on MWF schedule, has been accepted to the [facility] to resume dialysis after discharge.  If still here, will undertake dialysis tomorrow. 3. Anemia: Low but relatively stable hemoglobin and hematocrit.  There is some apprehension regarding ESA use with history of head and neck cancer and this decision will be shared/deferred to oncology. 4. CKD-MBD: Corrected calcium  level is at goal with low phosphorus level, he is not on binders and is on a regular diet. 5. Nutrition: Continue protein/nutritional supplementation along with regular diet. 6. Hypotension: With episodes of orthostatic hypotension with attempted ambulation that may in part be because of his prolonged bedridden status.  Agree with compression stockings and abdominal binders.  He is on midodrine .  Subjective:   No acute events overnight, tolerated dialysis without problems.  Wife concerned about orthostatic blood pressure drops.   Objective:   BP (!) 140/57   Pulse 73   Temp 98.7 F (37.1 C) (Oral)   Resp 19   Ht 6' 3 (1.905 m)   Wt 87.2 kg   SpO2 96%   BMI 24.03 kg/m   Physical Exam: Gen: Comfortably resting in bed, wife in recliner at bedside. CVS: Pulse regular rhythm, normal rate, S1 and S2 normal Resp: Clear to auscultation, no rales/rhonchi Abd: Soft, flat, nontender, bowel sounds normal Ext: No lower extremity edema.  Left upper arm AV fistula with palpable thrill/intact dressing.  Labs: BMET Recent Labs  Lab 05/06/23 0250 05/07/23 0331  05/08/23 0259 05/09/23 0452 05/11/23 0222  NA 130* 130* 131* 134* 129*  K 3.2* 3.6 3.9 3.6 4.9  CL 94* 96* 96* 96* 96*  CO2 26 24 22 27  21*  GLUCOSE 106* 117* 130* 92 96  BUN 23 29* 42* 23 49*  CREATININE 6.22* 7.69* 8.84* 5.08* 7.57*  CALCIUM  7.2* 7.4* 7.4* 7.6* 7.1*  PHOS 1.7* 2.3* 2.7 2.3* 2.6   CBC Recent Labs  Lab 05/07/23 0331 05/08/23 0259 05/09/23 0452 05/10/23 0338 05/11/23 0222  WBC 4.9 5.5 5.3  --  4.4  HGB 8.4* 8.6* 7.5* 8.0* 7.8*  HCT 26.4* 26.8* 23.5* 25.2* 25.1*  MCV 95.0 94.7 95.1  --  97.7  PLT 161 180 158  --  180      Medications:     acetaminophen   1,000 mg Oral Q6H   Chlorhexidine  Gluconate Cloth  6 each Topical Q0600   cholecalciferol   5,000 Units Oral Once per day on Monday Thursday   darbepoetin (ARANESP ) injection - DIALYSIS  150 mcg Subcutaneous Q Mon-1800   feeding supplement  1 Container Oral BID BM   gabapentin   300 mg Oral QHS   heparin  injection (subcutaneous)  5,000 Units Subcutaneous Q8H   insulin  aspart  0-6 Units Subcutaneous TID WC   midodrine   10 mg Oral TID WC   multivitamin  1 tablet Oral QHS   pantoprazole   40 mg Oral Daily   sertraline   25 mg Oral Daily   tamsulosin   0.4 mg Oral Daily   Gordy Blanch, MD 05/12/2023, 9:47 AM

## 2023-05-12 NOTE — Progress Notes (Addendum)
 Received call from [facility] this morning with request for orders. Contacted renal NP with clinic's request that orders be sent today as early as possible. Clinic advised awaiting insurance auth for snf and will provide update to clinic as soon as it is known if pt will be at treatment tomorrow. Will assist as needed.   Randine Mungo Renal Navigator [phone]  Addendum at 1:15 pm: Contacted [facility] to be advised that renal NP completed orders and sent to clinic. Also advised clinic that per CSW note, insurance denied snf due to pt being medically unstable. Clinic aware that it is unlikely that pt will start tomorrow due to above reason.

## 2023-05-12 NOTE — Assessment & Plan Note (Addendum)
Depressed mood recently. Sertraline 25mg  started while inpatient. - Continue Sertraline 25 mg daily

## 2023-05-12 NOTE — TOC Progression Note (Addendum)
 Transition of Care [facility]) - Progression Note    Patient Details  Name: Louis Butler MRN: [id number] Date of Birth: 11-Dec-1955  Transition of Care [facility]) CM/SW Contact  Louis Butler, Louis Butler Phone Number: 05/12/2023, 9:27 AM  Clinical Narrative:     Insurance requesting new PT note. CSW uploaded PT note and progress note in online portal.   1130: CSW called [facility] for auth status. CSW is informed that they have received updated notes and they are under review with their wellsite geologist.  1230: CSW received voicemail from [facility] that Louis Butler is denied due to pt not being medically stable(orthostatic hypotension). Attending team notified. Auth can be restarted once orthostatics resolve.   Expected Discharge Plan: Home w Home Health Services Barriers to Discharge: Continued Medical Work up  Expected Discharge Plan and Services   Discharge Planning Services: CM Consult Post Acute Care Choice: Home Health Living arrangements for the past 2 months: Single Family Home Expected Discharge Date: 05/11/23               DME Arranged: Hospital bed DME Agency: [employer] Date DME Agency Contacted: 05/04/23 Time DME Agency Contacted: [phone] Representative spoke with at DME Agency: Arthea HH Arranged: PT, Nurse's Aide HH Agency: [facility] Date [facility] Agency Contacted: 05/04/23 Time HH Agency Contacted: [phone] Representative spoke with at [facility] Agency: Amy   Social Determinants of Health (SDOH) Interventions SDOH Screenings   Food Insecurity: No Food Insecurity (05/08/2023)  Housing: Low Risk  (05/08/2023)  Transportation Needs: No Transportation Needs (05/08/2023)  Utilities: Not At Risk (05/08/2023)  Alcohol Screen: Low Risk  (12/01/2022)  Depression (PHQ2-9): Medium Risk (12/24/2022)  Financial Resource Strain: Low Risk  (12/01/2022)  Physical Activity: Insufficiently Active (12/01/2022)  Social Connections: Moderately Integrated  (05/08/2023)  Stress: Stress Concern Present (12/01/2022)  Tobacco Use: Medium Risk (04/29/2023)  Health Literacy: Adequate Health Literacy (12/01/2022)    Readmission Risk Interventions    05/04/2023    4:35 PM  Readmission Risk Prevention Plan  Transportation Screening Complete  HRI or Home Care Consult Complete  Social Work Consult for Recovery Care Planning/Counseling Complete  Palliative Care Screening Not Applicable  Medication Review Oceanographer) Referral to Pharmacy

## 2023-05-12 NOTE — Assessment & Plan Note (Addendum)
 Stable.  This is the largest barrier to his discharge at this time.  Will contact nursing to ensure paced patient is wearing compressive clothing. - Abdominal binders, compression stockings -Will reach out to nursing to ensure that these are actually in place - Holding home Amlodipine , Coreg  - Target MAP >60

## 2023-05-12 NOTE — Assessment & Plan Note (Addendum)
Foley to remain in place until outpatient follow-up, continue with 14-day total course of antibiotics.  Stable. - Completed abx: Zosyn (1/24-1/27), Cefepime (1/27-1/30) - Continue Cefepime with HD (1/31-2/5), confirmed with HD center

## 2023-05-12 NOTE — Assessment & Plan Note (Addendum)
Stable, pending discharge to SNF or home pending clinical improvement.   - Nephrology following, appreciate recommendations - Continue Midodrine 10mg  TID and with HD - RFP Monday

## 2023-05-12 NOTE — Progress Notes (Signed)
 Orthopedic Tech Progress Note Patient Details:  Louis Butler Jul 18, 1955 [id number] [facility] called requesting an DIERDRE LEVERING for patient, gave it to PT since he was working with patient this morning    Ortho Devices Type of Ortho Device: Abdominal binder Ortho Device/Splint Location: STOMACH Ortho Device/Splint Interventions: Ordered, Other (comment)   Post Interventions Patient Tolerated: Well Instructions Provided: Care of device  Delanna LITTIE Pac 05/12/2023, 8:58 AM

## 2023-05-12 NOTE — Assessment & Plan Note (Addendum)
Stable.  Hemoglobin 7.8.  Transfusion threshold <7. - Monitor with CBC weekly. - Recommend OP follow-up

## 2023-05-13 ENCOUNTER — Encounter (HOSPITAL_COMMUNITY): Payer: Self-pay | Admitting: Pulmonary Disease

## 2023-05-13 DIAGNOSIS — I951 Orthostatic hypotension: Secondary | ICD-10-CM | POA: Diagnosis not present

## 2023-05-13 LAB — RENAL FUNCTION PANEL
Albumin: 2.5 g/dL — ABNORMAL LOW (ref 3.5–5.0)
Anion gap: 12 (ref 5–15)
BUN: 41 mg/dL — ABNORMAL HIGH (ref 8–23)
CO2: 23 mmol/L (ref 22–32)
Calcium: 8 mg/dL — ABNORMAL LOW (ref 8.9–10.3)
Chloride: 98 mmol/L (ref 98–111)
Creatinine, Ser: 6.33 mg/dL — ABNORMAL HIGH (ref 0.61–1.24)
GFR, Estimated: 9 mL/min — ABNORMAL LOW (ref >60)
Glucose, Bld: 128 mg/dL — ABNORMAL HIGH (ref 70–99)
Phosphorus: 2.5 mg/dL (ref 2.5–4.6)
Potassium: 4.1 mmol/L (ref 3.5–5.1)
Sodium: 133 mmol/L — ABNORMAL LOW (ref 135–145)

## 2023-05-13 LAB — CBC
HCT: 25 % — ABNORMAL LOW (ref 39.0–52.0)
Hemoglobin: 7.9 g/dL — ABNORMAL LOW (ref 13.0–17.0)
MCH: 30.7 pg (ref 26.0–34.0)
MCHC: 31.6 g/dL (ref 30.0–36.0)
MCV: 97.3 fL (ref 80.0–100.0)
Platelets: 176 10*3/uL (ref 150–400)
RBC: 2.57 MIL/uL — ABNORMAL LOW (ref 4.22–5.81)
RDW: 15.8 % — ABNORMAL HIGH (ref 11.5–15.5)
WBC: 3.9 10*3/uL — ABNORMAL LOW (ref 4.0–10.5)
nRBC: 0 % (ref 0.0–0.2)

## 2023-05-13 LAB — GLUCOSE, CAPILLARY
Glucose-Capillary: 104 mg/dL — ABNORMAL HIGH (ref 70–99)
Glucose-Capillary: 91 mg/dL (ref 70–99)
Glucose-Capillary: 123 mg/dL — ABNORMAL HIGH (ref 70–99)

## 2023-05-13 MED ORDER — MIDODRINE HCL 5 MG PO TABS
20.0000 mg | ORAL_TABLET | Freq: Three times a day (TID) | ORAL | Status: DC
Start: 2023-05-13 — End: 2023-05-16
  Administered 2023-05-13 – 2023-05-16 (×10): 20 mg via ORAL
  Filled 2023-05-13 (×10): qty 4

## 2023-05-13 MED ORDER — HEPARIN SODIUM (PORCINE) 1000 UNIT/ML DIALYSIS
40.0000 [IU]/kg | INTRAMUSCULAR | Status: DC | PRN
  Administered 2023-05-13: 3500 [IU] via INTRAVENOUS_CENTRAL
  Filled 2023-05-13 (×2): qty 4

## 2023-05-13 MED ORDER — ONDANSETRON HCL 4 MG PO TABS
4.0000 mg | ORAL_TABLET | Freq: Once | ORAL | Status: AC
  Administered 2023-05-13: 4 mg via ORAL
  Filled 2023-05-13: qty 1

## 2023-05-13 NOTE — TOC Progression Note (Signed)
 Transition of Care [facility]) - Progression Note    Patient Details  Name: Louis Butler MRN: [id number] Date of Birth: 12-Feb-1956  Transition of Care [facility]) CM/SW Contact  Jiya Kissinger A Tara Wich, LCSW Phone Number: 05/13/2023, 5:03 PM  Clinical Narrative:     CSW started pt's insurance authorization request again to [facility].   Reference Auth ID: [id number]  Status pending.    TOC will continue to follow.   Expected Discharge Plan: Home w Home Health Services Barriers to Discharge: Continued Medical Work up  Expected Discharge Plan and Services   Discharge Planning Services: CM Consult Post Acute Care Choice: Home Health Living arrangements for the past 2 months: Single Family Home Expected Discharge Date: 05/11/23               DME Arranged: Hospital bed DME Agency: [employer] Date DME Agency Contacted: 05/04/23 Time DME Agency Contacted: [phone] Representative spoke with at DME Agency: Arthea HH Arranged: PT, Nurse's Aide HH Agency: [facility] Date [facility] Agency Contacted: 05/04/23 Time HH Agency Contacted: [phone] Representative spoke with at [facility] Agency: Amy   Social Determinants of Health (SDOH) Interventions SDOH Screenings   Food Insecurity: No Food Insecurity (05/08/2023)  Housing: Low Risk  (05/08/2023)  Transportation Needs: No Transportation Needs (05/08/2023)  Utilities: Not At Risk (05/08/2023)  Alcohol Screen: Low Risk  (12/01/2022)  Depression (PHQ2-9): Medium Risk (12/24/2022)  Financial Resource Strain: Low Risk  (12/01/2022)  Physical Activity: Insufficiently Active (12/01/2022)  Social Connections: Moderately Integrated (05/08/2023)  Stress: Stress Concern Present (12/01/2022)  Tobacco Use: Medium Risk (04/29/2023)  Health Literacy: Adequate Health Literacy (12/01/2022)    Readmission Risk Interventions    05/04/2023    4:35 PM  Readmission Risk Prevention Plan  Transportation Screening Complete  HRI or Home Care Consult Complete  Social Work  Consult for Recovery Care Planning/Counseling Complete  Palliative Care Screening Not Applicable  Medication Review Oceanographer) Referral to Pharmacy

## 2023-05-13 NOTE — Assessment & Plan Note (Signed)
-  WOC, frequent turns -RD recs

## 2023-05-13 NOTE — Assessment & Plan Note (Addendum)
 Stable.  Hemoglobin 7.9.  Transfusion threshold <7. - Monitor with CBC on dialysis days. - Recommend OP follow-up

## 2023-05-13 NOTE — Procedures (Signed)
 Patient seen on Hemodialysis. BP (!) 91/50   Pulse 77   Temp (P) 98 F (36.7 C)   Resp 17   Ht 6' 3 (1.905 m)   Wt 87.2 kg   SpO2 95%   BMI 24.03 kg/m   QB 400, UF goal 1L Tolerating treatment without complaints at this time.   Gordy Blanch MD [facility]. Office # [phone] Pager # [phone] 11:02 AM

## 2023-05-13 NOTE — Plan of Care (Signed)
  Problem: Coping: Goal: Ability to adjust to condition or change in health will improve Outcome: Not Progressing   Problem: Fluid Volume: Goal: Ability to maintain a balanced intake and output will improve Outcome: Not Progressing   Problem: Health Behavior/Discharge Planning: Goal: Ability to identify and utilize available resources and services will improve Outcome: Not Progressing Goal: Ability to manage health-related needs will improve Outcome: Not Progressing   Problem: Metabolic: Goal: Ability to maintain appropriate glucose levels will improve Outcome: Not Progressing   Problem: Nutritional: Goal: Maintenance of adequate nutrition will improve Outcome: Not Progressing Goal: Progress toward achieving an optimal weight will improve Outcome: Not Progressing   Problem: Skin Integrity: Goal: Risk for impaired skin integrity will decrease Outcome: Not Progressing   Problem: Tissue Perfusion: Goal: Adequacy of tissue perfusion will improve Outcome: Not Progressing   Problem: Education: Goal: Knowledge of General Education information will improve Description: Including pain rating scale, medication(s)/side effects and non-pharmacologic comfort measures Outcome: Not Progressing   Problem: Health Behavior/Discharge Planning: Goal: Ability to manage health-related needs will improve Outcome: Not Progressing   Problem: Clinical Measurements: Goal: Ability to maintain clinical measurements within normal limits will improve Outcome: Not Progressing Goal: Will remain free from infection Outcome: Not Progressing Goal: Diagnostic test results will improve Outcome: Not Progressing Goal: Respiratory complications will improve Outcome: Not Progressing Goal: Cardiovascular complication will be avoided Outcome: Not Progressing   Problem: Activity: Goal: Risk for activity intolerance will decrease Outcome: Not Progressing   Problem: Nutrition: Goal: Adequate nutrition  will be maintained Outcome: Not Progressing   Problem: Coping: Goal: Level of anxiety will decrease Outcome: Not Progressing   Problem: Elimination: Goal: Will not experience complications related to bowel motility Outcome: Not Progressing Goal: Will not experience complications related to urinary retention Outcome: Not Progressing   Problem: Pain Managment: Goal: General experience of comfort will improve and/or be controlled Outcome: Not Progressing   Problem: Safety: Goal: Ability to remain free from injury will improve Outcome: Not Progressing   Problem: Skin Integrity: Goal: Risk for impaired skin integrity will decrease Outcome: Not Progressing

## 2023-05-13 NOTE — Assessment & Plan Note (Addendum)
 Images were made to patient's medications today to try and improve his orthostasis.  We also confirmed that he had his compressive garments.  Will reevaluate orthostatic vital signs tomorrow. - Abdominal binders, compression stockings -Increased midodrine  to 20 mg 3 times daily and with HD - Target MAP >60

## 2023-05-13 NOTE — Progress Notes (Signed)
Patient transported to HD via bed with transport at this time.  No s/s of distress noted.  With c/o mild headache and nausea this am tylenol and zofran was given.  Patient ate breakfast and had BM prior to going for HD.  Wife remains at bedside.

## 2023-05-13 NOTE — Assessment & Plan Note (Deleted)
 Depressed mood recently. Sertraline 25mg  started while inpatient. - Continue Sertraline 25 mg daily

## 2023-05-13 NOTE — Progress Notes (Signed)
 Daily Progress Note Intern Pager: [phone]  Patient name: Louis Butler Medical record number: [id number] Date of birth: [birth year] Age: 68 y.o. Gender: male  Primary Care Provider: Donah Laymon PARAS, MD Consultants: Nephrology Code Status: Full  Pt Overview and Major Events to Date:  1/22: Admitted to ICU with CRRT 1/26: FM TS resumed care  Assessment and Plan:  Louis Butler is a 68 year old patient admitted for worsening ESRD requiring hemodialysis.  He has a past medical history of ESRD requiring HD, T2DM, hypertension, SCC of head and neck, and anemia.  Discharge to SNF was blocked due to patient's ongoing orthostatic hypotension, however he is otherwise medically stable for discharge. Assessment & Plan ESRD (end stage renal disease) (HCC) HD today, stable.  Outpatient HD schedule will be Monday Wednesday Friday at [facility]. - Nephrology following, appreciate recommendations - Continue Midodrine , dosage increase listed below - RFP daily Urinary tract infection  Bilateral Hydronephrosis (Resolved: 05/13/2023) Stable, final dose of cefepime  administered today with hemodialysis. - Completed abx: Zosyn  (1/24-1/27), Cefepime  (1/27-1/30) -Completed cefepime  with HD (1/31-2/5), confirmed with HD center Orthostatic hypotension Images were made to patient's medications today to try and improve his orthostasis.  We also confirmed that he had his compressive garments.  Will reevaluate orthostatic vital signs tomorrow. - Abdominal binders, compression stockings -Increased midodrine  to 20 mg 3 times daily and with HD - Target MAP >60  Anemia Stable.  Hemoglobin 7.9.  Transfusion threshold <7. - Monitor with CBC on dialysis days. 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  Continue with scheduled Tylenol  1000 mg for pain Depressed mood: Continue sertraline  25 mg daily Hypertension: Holding home medications secondary to orthostasis GERD: Protonix  40 mg daily T2DM: Very sensitive sliding scale insulin  while hospitalized  FEN/GI: Regular with fluid restriction 1200 mL PPx: Heparin  Dispo: Home or SNF pending improvement of orthostasis  .   Subjective:  Louis Butler is feeling much better this morning and appears to be in good spirits.  He had some nausea early this morning prior to hemodialysis and received a dose of Zofran  which settled his stomach.  Objective: Temp:  [97.5 F (36.4 C)-98.7 F (37.1 C)] 97.7 F (36.5 C) (02/05 0434) Pulse Rate:  [67-116] 68 (02/04 1400) Resp:  [13-23] 14 (02/04 1400) BP: (99-141)/(41-61) 141/59 (02/04 1300) SpO2:  [96 %-100 %] 98 % (02/04 1400) Physical Exam: General: Elderly-appearing gentleman, no distress Cardiovascular: RRR, no M/R/G Respiratory: CTAB, no increased work of breathing Abdomen: Flat, soft, nontender Extremities: 2+ pulses in the bilateral upper extremities  Laboratory: Most recent CBC Lab Results  Component Value Date   WBC 4.4 05/11/2023   HGB 7.8 (L) 05/11/2023   HCT 25.1 (L) 05/11/2023   MCV 97.7 05/11/2023   PLT 180 05/11/2023   Most recent BMP    Latest Ref Rng & Units 05/11/2023    2:22 AM  BMP  Glucose 70 - 99 mg/dL 96   BUN 8 - 23 mg/dL 49   Creatinine 9.38 - 1.24 mg/dL 2.42   Sodium 864 - 854 mmol/L 129   Potassium 3.5 - 5.1 mmol/L 4.9   Chloride 98 - 111 mmol/L 96   CO2 22 - 32 mmol/L 21   Calcium  8.9 - 10.3 mg/dL 7.1     Louis Butler,  Lucie, DO 05/13/2023, 7:21 AM  PGY-1, [facility] FPTS Intern pager: [phone], text pages welcome Secure chat group [facility] Teaching Service

## 2023-05-13 NOTE — Assessment & Plan Note (Addendum)
 HD today, stable.  Outpatient HD schedule will be Monday Wednesday Friday at [facility] kidney center. - Nephrology following, appreciate recommendations - Continue Midodrine , dosage increase listed below - RFP daily

## 2023-05-13 NOTE — Progress Notes (Signed)
 Patient ID: Louis Butler, male   DOB: Sep 14, 1955, 68 y.o.   MRN: [id number] [facility] Progress Note   Assessment/ Plan:   1.  Deconditioning status post hospitalization: Awaiting reevaluation by physical therapy for discharge to skilled nursing facility; they were concerned about his orthostatic blood pressure drops and mobility.  On abdominal binder and midodrine  dose increased. 2. ESRD: Continue hemodialysis on MWF schedule, has been accepted to the [facility] kidney Center to resume dialysis after discharge.  Awaiting SNF. 3. Anemia: Low but relatively stable hemoglobin and hematocrit.  There is some apprehension regarding ESA use with history of head and neck cancer and this decision will be shared/deferred to oncology. 4. CKD-MBD: Corrected calcium  level is at goal with low phosphorus level, he is not on binders and is on a regular diet. 5. Nutrition: Continue protein/nutritional supplementation along with regular diet. 6. Hypotension: With episodes of orthostatic hypotension with attempted ambulation that may in part be because of his prolonged bedridden status.  Agree with compression stockings and abdominal binders and midodrine  increased to 20 mg 3 times daily.  Subjective:   Continues to struggle with intermittent hypotension; midodrine  dose adjusted to 20 mg 3 times a day.   Objective:   BP (!) 91/50   Pulse 77   Temp (P) 98 F (36.7 C)   Resp 17   Ht 6' 3 (1.905 m)   Wt 87.2 kg   SpO2 95%   BMI 24.03 kg/m   Physical Exam: Gen: Comfortably resting in dialysis CVS: Pulse regular rhythm, normal rate, S1 and S2 normal Resp: Clear to auscultation, no rales/rhonchi Abd: Soft, flat, nontender, bowel sounds normal Ext: No lower extremity edema.  Left upper arm AV fistula cannulated at dialysis  Labs: BMET Recent Labs  Lab 05/07/23 0331 05/08/23 0259 05/09/23 0452 05/11/23 0222 05/13/23 0800  NA 130* 131* 134* 129* 133*  K 3.6 3.9 3.6 4.9 4.1  CL 96*  96* 96* 96* 98  CO2 24 22 27  21* 23  GLUCOSE 117* 130* 92 96 128*  BUN 29* 42* 23 49* 41*  CREATININE 7.69* 8.84* 5.08* 7.57* 6.33*  CALCIUM  7.4* 7.4* 7.6* 7.1* 8.0*  PHOS 2.3* 2.7 2.3* 2.6 2.5   CBC Recent Labs  Lab 05/08/23 0259 05/09/23 0452 05/10/23 0338 05/11/23 0222 05/13/23 0800  WBC 5.5 5.3  --  4.4 3.9*  HGB 8.6* 7.5* 8.0* 7.8* 7.9*  HCT 26.8* 23.5* 25.2* 25.1* 25.0*  MCV 94.7 95.1  --  97.7 97.3  PLT 180 158  --  180 176      Medications:     acetaminophen   1,000 mg Oral Q6H   cholecalciferol   5,000 Units Oral Once per day on Monday Thursday   darbepoetin (ARANESP ) injection - DIALYSIS  150 mcg Subcutaneous Q Mon-1800   gabapentin   300 mg Oral QHS   heparin  injection (subcutaneous)  5,000 Units Subcutaneous Q8H   insulin  aspart  0-6 Units Subcutaneous TID WC   midodrine   20 mg Oral TID WC   multivitamin  1 tablet Oral QHS   pantoprazole   40 mg Oral Daily   sertraline   25 mg Oral Daily   tamsulosin   0.4 mg Oral Daily   Gordy Blanch, MD 05/13/2023, 11:00 AM

## 2023-05-13 NOTE — Assessment & Plan Note (Addendum)
 Stable, final dose of cefepime  administered today with hemodialysis. - Completed abx: Zosyn  (1/24-1/27), Cefepime  (1/27-1/30) -Completed cefepime  with HD (1/31-2/5), confirmed with HD center

## 2023-05-13 NOTE — Assessment & Plan Note (Signed)
-   RD following

## 2023-05-13 NOTE — Plan of Care (Signed)
  Problem: Coping: Goal: Ability to adjust to condition or change in health will improve Outcome: Progressing   Problem: Skin Integrity: Goal: Risk for impaired skin integrity will decrease Outcome: Progressing   Problem: Safety: Goal: Ability to remain free from injury will improve Outcome: Progressing   Problem: Elimination: Goal: Will not experience complications related to bowel motility Outcome: Progressing

## 2023-05-13 NOTE — Progress Notes (Signed)
   05/13/23 1320  Vitals  Temp 98.6 F (37 C)  Pulse Rate 100  Resp 18  BP 115/71  SpO2 100 %  O2 Device Room Air  Weight  (unable to weigh--bed not zeroed)  Type of Weight Post-Dialysis  Oxygen Therapy  Patient Activity (if Appropriate) In bed  Pulse Oximetry Type Continuous  Oximetry Probe Site Changed No  Post Treatment  Dialyzer Clearance Lightly streaked  Hemodialysis Intake (mL) 0 mL  Liters Processed 78.8  Fluid Removed (mL) 1500 mL  Tolerated HD Treatment Yes  AVG/AVF Arterial Site Held (minutes) 10 minutes  AVG/AVF Venous Site Held (minutes) 10 minutes   Received patient in bed to unit.  Alert and oriented.  Informed consent signed and in chart.   TX duration:3.45  Patient tolerated well.  Transported back to the room  Alert, without acute distress.  Hand-off given to patient's nurse.   Access used: LUAF Access issues: no complications  Total UF removed: 1500 Medication(s) given: none   [facility]

## 2023-05-13 NOTE — Assessment & Plan Note (Deleted)
 Stable. - Follow-up with [facility] - Scheduled tylenol 1000mg  q6h for pain

## 2023-05-14 DIAGNOSIS — I951 Orthostatic hypotension: Secondary | ICD-10-CM | POA: Diagnosis not present

## 2023-05-14 DIAGNOSIS — N189 Chronic kidney disease, unspecified: Secondary | ICD-10-CM | POA: Diagnosis not present

## 2023-05-14 DIAGNOSIS — N179 Acute kidney failure, unspecified: Secondary | ICD-10-CM | POA: Diagnosis not present

## 2023-05-14 DIAGNOSIS — N186 End stage renal disease: Secondary | ICD-10-CM | POA: Diagnosis not present

## 2023-05-14 LAB — RENAL FUNCTION PANEL
Albumin: 2.4 g/dL — ABNORMAL LOW (ref 3.5–5.0)
Anion gap: 8 (ref 5–15)
BUN: 26 mg/dL — ABNORMAL HIGH (ref 8–23)
CO2: 29 mmol/L (ref 22–32)
Calcium: 7.9 mg/dL — ABNORMAL LOW (ref 8.9–10.3)
Chloride: 97 mmol/L — ABNORMAL LOW (ref 98–111)
Creatinine, Ser: 4.22 mg/dL — ABNORMAL HIGH (ref 0.61–1.24)
GFR, Estimated: 15 mL/min — ABNORMAL LOW (ref >60)
Glucose, Bld: 90 mg/dL (ref 70–99)
Phosphorus: 2.5 mg/dL (ref 2.5–4.6)
Potassium: 4.2 mmol/L (ref 3.5–5.1)
Sodium: 134 mmol/L — ABNORMAL LOW (ref 135–145)

## 2023-05-14 LAB — CBC
HCT: 25.8 % — ABNORMAL LOW (ref 39.0–52.0)
Hemoglobin: 8.2 g/dL — ABNORMAL LOW (ref 13.0–17.0)
MCH: 30.8 pg (ref 26.0–34.0)
MCHC: 31.8 g/dL (ref 30.0–36.0)
MCV: 97 fL (ref 80.0–100.0)
Platelets: 204 10*3/uL (ref 150–400)
RBC: 2.66 MIL/uL — ABNORMAL LOW (ref 4.22–5.81)
RDW: 15.9 % — ABNORMAL HIGH (ref 11.5–15.5)
WBC: 4.6 10*3/uL (ref 4.0–10.5)
nRBC: 0 % (ref 0.0–0.2)

## 2023-05-14 LAB — GLUCOSE, CAPILLARY
Glucose-Capillary: 84 mg/dL (ref 70–99)
Glucose-Capillary: 174 mg/dL — ABNORMAL HIGH (ref 70–99)
Glucose-Capillary: 165 mg/dL — ABNORMAL HIGH (ref 70–99)
Glucose-Capillary: 163 mg/dL — ABNORMAL HIGH (ref 70–99)

## 2023-05-14 NOTE — Assessment & Plan Note (Deleted)
 Depressed mood recently. Sertraline 25mg  started while inpatient. - Continue Sertraline 25 mg daily

## 2023-05-14 NOTE — Assessment & Plan Note (Addendum)
 Resting Bps have improved today.  Repeat orthostatic vital signs show significant drop on standing to 64/46, patient was more tolerant but still limited in ability to complete physical therapy with this low blood pressure. Given minimal improvement with interventions, will obtain adrenal testing.  - Abdominal binders, compression stockings -midodrine  20 mg 3 times daily and with HD - Target MAP >60  -AM cortisol to evaluate for adrenal dysfunction

## 2023-05-14 NOTE — TOC Progression Note (Signed)
 Transition of Care [facility]) - Progression Note    Patient Details  Name: Louis Butler MRN: [id number] Date of Birth: 1955-06-24  Transition of Care [facility]) CM/SW Contact  Gwenn Frieze [place], [place] Phone Number: 05/14/2023, 10:53 AM  Clinical Narrative: Per [facility] Community[facility] auth request for [facility] remains pending at this time. The case has not been assigned to a CM for review yet. MD updated.   Frieze Gwenn, MSW, LCSW [phone] (coverage)         Expected Discharge Plan: Home w Home Health Services Barriers to Discharge: Continued Medical Work up  Expected Discharge Plan and Services   Discharge Planning Services: CM Consult Post Acute Care Choice: Home Health Living arrangements for the past 2 months: Single Family Home Expected Discharge Date: 05/11/23               DME Arranged: Hospital bed DME Agency: [employer] Date DME Agency Contacted: 05/04/23 Time DME Agency Contacted: [phone] Representative spoke with at DME Agency: Arthea HH Arranged: PT, Nurse's Aide HH Agency: [facility] Date [facility] Agency Contacted: 05/04/23 Time HH Agency Contacted: [phone] Representative spoke with at [facility] Agency: Amy   Social Determinants of Health (SDOH) Interventions SDOH Screenings   Food Insecurity: No Food Insecurity (05/08/2023)  Housing: Low Risk  (05/08/2023)  Transportation Needs: No Transportation Needs (05/08/2023)  Utilities: Not At Risk (05/08/2023)  Alcohol Screen: Low Risk  (12/01/2022)  Depression (PHQ2-9): Medium Risk (12/24/2022)  Financial Resource Strain: Low Risk  (12/01/2022)  Physical Activity: Insufficiently Active (12/01/2022)  Social Connections: Moderately Integrated (05/08/2023)  Stress: Stress Concern Present (12/01/2022)  Tobacco Use: Medium Risk (04/29/2023)  Health Literacy: Adequate Health Literacy (12/01/2022)    Readmission Risk Interventions    05/04/2023    4:35 PM  Readmission Risk Prevention Plan  Transportation Screening Complete   HRI or Home Care Consult Complete  Social Work Consult for Recovery Care Planning/Counseling Complete  Palliative Care Screening Not Applicable  Medication Review Oceanographer) Referral to Pharmacy

## 2023-05-14 NOTE — Assessment & Plan Note (Addendum)
 Stable.  Outpatient HD schedule will be Monday Wednesday Friday at [facility] kidney center. - Nephrology following, appreciate recommendations - Continue Midodrine , dosage increase listed above - RFP daily

## 2023-05-14 NOTE — Progress Notes (Signed)
     Daily Progress Note Intern Pager: [phone]  Patient name: Louis Butler Medical record number: [id number] Date of birth: 1955-10-06 Age: 68 y.o. Gender: male  Primary Care Provider: Donah Laymon PARAS, MD Consultants: Nephrology Code Status: Full  Pt Overview and Major Events to Date:  1/22: Admitted to ICU with CRRT 1/26: FM TS resumed care  Assessment and Plan:  Louis Butler is a 68 year old patient admitted for worsening ESRD requiring hemodialysis.  His orthostasis was a previous barrier to discharge however it seems to be improving today, he is otherwise medically stable for discharge at this time. Assessment & Plan Orthostatic hypotension Resting Bps have improved today.  Repeat orthostatic vital signs show significant drop on standing to 64/46, patient was more tolerant but still limited in ability to complete physical therapy with this low blood pressure. Given minimal improvement with interventions, will obtain adrenal testing.  - Abdominal binders, compression stockings -midodrine  20 mg 3 times daily and with HD - Target MAP >60  -AM cortisol to evaluate for adrenal dysfunction ESRD (end stage renal disease) (HCC) Stable.  Outpatient HD schedule will be Monday Wednesday Friday at [facility] kidney center. - Nephrology following, appreciate recommendations - Continue Midodrine , dosage increase listed above - RFP daily Anemia Stable.  Hemoglobin 8.2.  Transfusion threshold <7. 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    Subjective:  Patient was sitting up in bed on exam today with wife at bedside.  He reports that this is the best he is felt since admission and he is hopeful that his blood pressure will be stable enough for him to leave soon.  Objective: Temp:  [97.7 F (36.5 C)-98.9 F (37.2 C)] 98.1 F (36.7 C) (02/06 0751) Pulse Rate:  [73-131] 79 (02/05 1600) Resp:  [14-24] 24 (02/05 1600) BP: (84-155)/(41-77) 131/74 (02/05 1600) SpO2:  [91 %-100 %] 98 % (02/05 1600) Physical Exam: General: Well-appearing elderly gentleman, no distress Cardiovascular: RRR, no M/R/G Respiratory: CTAB, no increased work of breathing Abdomen: Flat, soft, nontender Extremities: 2+ peripheral pulses in all 4 extremities  Laboratory: Most recent CBC Lab Results  Component Value Date   WBC 4.6 05/14/2023   HGB 8.2 (L) 05/14/2023   HCT 25.8 (L) 05/14/2023   MCV 97.0 05/14/2023   PLT 204 05/14/2023   Most recent BMP    Latest Ref Rng & Units 05/14/2023    3:37 AM  BMP  Glucose 70 - 99 mg/dL 90   BUN 8 - 23 mg/dL 26   Creatinine 9.38 - 1.24 mg/dL 5.77   Sodium 864 - 854 mmol/L 134   Potassium 3.5 - 5.1 mmol/L 4.2   Chloride 98 - 111 mmol/L 97   CO2 22 - 32 mmol/L 29   Calcium  8.9 - 10.3 mg/dL 7.9     Louis Lukes, DO 05/14/2023, 8:52 AM  PGY-1, [facility] FPTS Intern pager: [phone], text pages welcome Secure chat group [facility] Teaching Service

## 2023-05-14 NOTE — Progress Notes (Signed)
 Nutrition Follow-up  DOCUMENTATION CODES:   Non-severe (moderate) malnutrition in context of chronic illness  INTERVENTION:  Encourage adequate PO intake; double protein portions with meals Magic cup TID with meals, each supplement provides 290 kcal and 9 grams of protein Renal MVI with minerals daily  NUTRITION DIAGNOSIS:   Moderate Malnutrition related to chronic illness (renal disease) as evidenced by moderate fat depletion, mild muscle depletion, moderate muscle depletion, percent weight loss (16% weight loss within 6 months). - remains applicable  GOAL:   Patient will meet greater than or equal to 90% of their needs - progressing  MONITOR:   PO intake, Supplement acceptance, Skin  REASON FOR ASSESSMENT:   Consult Assessment of nutrition requirement/status, Diet education  ASSESSMENT:   68 yo male admitted with acute on chronic renal failure. PMH includes ESRD-stopped HD 06/2020, DM-2, GERD, HTN.  Next HD planned for tomorrow.  Discussed in rounds. Awaiting SNF placement. Orthostatic hypotension has been barrier to discharge. [name] PO intake has improved throughout admission. Has not received magic cup on trays, checked meal ordering system to ensure order added to system.   Average meal completions: 94% x8 recorded meals (1/30-2/5)  Last HD 2/5 Net UF 1.5L Unable to obtain pre/post-HD weight d/t bed scale not zeroed correctly.   Admit weight: 76.3 kg Last measured weight 2/4: 87.2 kg   Medications: vitamin D3 5000 units MTh, SSI 0-6 units TID, rena-vit, protonix   Labs:  Sodium 134 BUN 26 Cr 4.22 GFR 15 CBG's 84-123 x24 hours  UOP: x24 hours   Diet Order:   Diet Order             Diet regular Room service appropriate? Yes; Fluid consistency: Thin; Fluid restriction: 1200 mL Fluid  Diet effective now                   EDUCATION NEEDS:   Education needs have been addressed  Skin:  Skin Assessment: Skin Integrity Issues: Skin  Integrity Issues:: Stage II Stage II: R buttocks  Last BM:  2/5 x 2 medium type 4/5  Height:   Ht Readings from Last 1 Encounters:  04/29/23 6' 3 (1.905 m)    Weight:   Wt Readings from Last 1 Encounters:  12/24/22 93.4 kg    Ideal Body Weight:  89.1 kg  BMI:  Body mass index is 24.03 kg/m.  Estimated Nutritional Needs:   Kcal:  2200-2400  Protein:  110-120 gm  Fluid:  1 L + UOP  Royce Maris, RDN, LDN Clinical Nutrition

## 2023-05-14 NOTE — Progress Notes (Signed)
 Physical Therapy Treatment Patient Details Name: Louis Butler MRN: [id number] DOB: [birth year] Today's Date: 05/14/2023   History of Present Illness 68 y.o. male presented 1/22 with N/V/abd pain and was found to have marked hyperkalemia and renal failure. CRRT 1/23-1/24. 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 Tobie SERUM), DM, HTN, recent squamous cell ca H&N (surgery, XRT 2024), anemia.    PT Comments  Function remains limited by symptomatic orthostatic hypotension, primarily in standing, tolerating sitting well (see details below.) Max distance tolerated 40 feet today with moderate reliance on RW for support. He was able to stand for longer period of time today but BP dropped significantly. Educated on symptom awareness, activity modifications, and limiting time upright due to rapid drop in BP. Abdominal binder was donned, have requested taller and tighter compression stockings to see if that helps, but I'm not confident it will make a substantial difference in his symptoms due to how low the BP gets with standing activities. Patient will continue to benefit from skilled physical therapy services to further improve independence with functional mobility.  End of session, reclined in chair BP 135/56 (MAP 79) HR 74. Dizziness resolved.    05/14/23 0900  Orthostatic Lying   BP- Lying 126/57  Pulse- Lying 72  Orthostatic Sitting  BP- Sitting 106/58  Pulse- Sitting 84  Orthostatic Standing at 0 minutes  BP- Standing at 0 minutes (!) 89/56  Pulse- Standing at 0 minutes 104  Orthostatic Standing at 3 minutes  BP- Standing at 3 minutes (!) 64/45  Pulse- Standing at 3 minutes 111      If plan is discharge home, recommend the following: A little help with walking and/or transfers;A little help with bathing/dressing/bathroom;Assistance with cooking/housework;Assist for transportation;Help with stairs or ramp for entrance   Can travel by private vehicle     Yes  Equipment Recommendations  None recommended by PT     Recommendations for Other Services       Precautions / Restrictions Precautions Precautions: Fall Precaution Comments: Monitor BP. Orthostatic - has abd binder Restrictions Weight Bearing Restrictions Per Provider Order: No     Mobility  Bed Mobility Overal bed mobility: Needs Assistance Bed Mobility: Supine to Sit     Supine to sit: Supervision     General bed mobility comments: Supervision for safety, extra time, no physical assist needed    Transfers Overall transfer level: Needs assistance Equipment used: None Transfers: Sit to/from Stand Sit to Stand: Contact guard assist           General transfer comment: CGA for safety, stable upon rising today but quicly becomes lightheaded. See orthostatics.    Ambulation/Gait Ambulation/Gait assistance: Contact guard assist Gait Distance (Feet): 40 Feet Assistive device: Rolling walker (2 wheels) Gait Pattern/deviations: Step-through pattern, Decreased stride length, Trunk flexed Gait velocity: dec Gait velocity interpretation: <1.31 ft/sec, indicative of household ambulator   General Gait Details: Improved posture but still somewhat flexed, worsens with distance as his dizziness progresses. Heavy reliance on RW for support. Educated on symptom awareness and safety.   Stairs             Wheelchair Mobility     Tilt Bed    Modified Rankin (Stroke Patients Only)       Balance Overall balance assessment: Needs assistance Sitting-balance support: No upper extremity supported, Feet supported Sitting balance-Leahy Scale: Good     Standing balance support: No upper extremity supported Standing balance-Leahy Scale: Fair Standing balance comment: More stable with assistive device.  Cognition Arousal: Alert Behavior During Therapy: WFL for tasks assessed/performed Overall Cognitive Status: Within Functional Limits for tasks assessed                                           Exercises      General Comments General comments (skin integrity, edema, etc.): See orthostatics. Abdominal binder in place. Requested RN order taller and tighter compression stockings.      Pertinent Vitals/Pain Pain Assessment Pain Assessment: No/denies pain    Home Living                          Prior Function            PT Goals (current goals can now be found in the care plan section) Acute Rehab PT Goals Patient Stated Goal: Get well. PT Goal Formulation: With patient Time For Goal Achievement: 05/22/23 Potential to Achieve Goals: Fair Progress towards PT goals: Progressing toward goals    Frequency    Min 1X/week      PT Plan      Co-evaluation              AM-PAC PT 6 Clicks Mobility   Outcome Measure  Help needed turning from your back to your side while in a flat bed without using bedrails?: None Help needed moving from lying on your back to sitting on the side of a flat bed without using bedrails?: None Help needed moving to and from a bed to a chair (including a wheelchair)?: A Little Help needed standing up from a chair using your arms (e.g., wheelchair or bedside chair)?: A Little Help needed to walk in hospital room?: A Little Help needed climbing 3-5 steps with a railing? : Total 6 Click Score: 18    End of Session Equipment Utilized During Treatment: Gait belt Activity Tolerance: Treatment limited secondary to medical complications (Comment) (Drop in BP.) Patient left: in chair;with call bell/phone within reach;with chair alarm set;with family/visitor present Nurse Communication: Mobility status (BP orthostatics) PT Visit Diagnosis: Unsteadiness on feet (R26.81);Muscle weakness (generalized) (M62.81);Difficulty in walking, not elsewhere classified (R26.2);Dizziness and giddiness (R42)     Time: 9047-8981 PT Time Calculation (min) (ACUTE ONLY): 26 min  Charges:    $Gait Training: 8-22  mins $Therapeutic Activity: 8-22 mins PT General Charges $$ ACUTE PT VISIT: 1 Visit                     Leontine Roads, PT, DPT [facility]  Rehabilitation Services Physical Therapist Office: [phone] Website: [email or web address]    Leontine GORMAN Roads 05/14/2023, 10:28 AM

## 2023-05-14 NOTE — Assessment & Plan Note (Deleted)
 Stable. - Follow-up with [facility] - Scheduled tylenol 1000mg  q6h for pain

## 2023-05-14 NOTE — Assessment & Plan Note (Deleted)
-   RD following

## 2023-05-14 NOTE — Progress Notes (Signed)
 Contacted [facility] to advise clinic staff that insurance auth for snf is still pending and that pt will likely not be at treatment tomorrow. Will contact clinic if pt happens to d/c today and will start tomorrow. HD orders faxed to clinic per their request. Will assist as needed.   Randine Mungo Renal Navigator [phone]

## 2023-05-14 NOTE — Plan of Care (Signed)
  Problem: Skin Integrity: Goal: Risk for impaired skin integrity will decrease Outcome: Progressing   Problem: Activity: Goal: Risk for activity intolerance will decrease Outcome: Progressing   Problem: Nutrition: Goal: Adequate nutrition will be maintained Outcome: Progressing   Problem: Safety: Goal: Ability to remain free from injury will improve Outcome: Progressing

## 2023-05-14 NOTE — Assessment & Plan Note (Deleted)
-  WOC, frequent turns -RD recs

## 2023-05-14 NOTE — Assessment & Plan Note (Signed)
 Stable.  Hemoglobin 8.2.  Transfusion threshold <7. - Monitor with CBC on dialysis days moving forward - Recommend OP follow-up

## 2023-05-14 NOTE — Progress Notes (Signed)
 Patient ID: Louis Butler, male   DOB: 12-19-1955, 68 y.o.   MRN: [id number] [facility] Progress Note   Assessment/ Plan:   1.  Deconditioning status post hospitalization: Awaiting reevaluation by physical therapy for discharge to skilled nursing facility; blood pressures appear to have been improved with up titration of midodrine  and compression stockings/abdominal binder. 2. ESRD: Continue hemodialysis on MWF schedule with next treatment ordered for tomorrow.  He has been accepted to the [facility] kidney [facility] to resume dialysis after discharge.  Awaiting SNF (unclear if this will change dialysis unit placement). 3. Anemia: Low but relatively stable hemoglobin and hematocrit.  There is some apprehension regarding ESA use with history of head and neck cancer and this decision will be shared/deferred to oncology. 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 5. Nutrition: Continue protein/nutritional supplementation along with regular diet. 6. Hypotension: With episodes of orthostatic hypotension with attempted ambulation that may in part be because of his prolonged bedridden status.  Improved with adjustments in therapies.  Subjective:   States that he felt better at dialysis yesterday and did not have dizziness thereafter.  Wife at bedside informs me that he was not orthostatic on recheck yesterday evening and the process restarted for SNF placement   Objective:   BP 131/74   Pulse 79   Temp 98.1 F (36.7 C) (Oral)   Resp (!) 24   Ht 6' 3 (1.905 m)   Wt 87.2 kg   SpO2 98%   BMI 24.03 kg/m   Physical Exam: Gen: Comfortably sitting up in bed eating breakfast CVS: Pulse regular rhythm, normal rate, S1 and S2 normal Resp: Clear to auscultation, no rales/rhonchi Abd: Soft, flat, nontender, bowel sounds normal Ext: No lower extremity edema.  Left upper arm AV fistula with intact  dressings/palpable thrill  Labs: BMET Recent Labs  Lab 05/08/23 0259 05/09/23 0452 05/11/23 0222 05/13/23 0800 05/14/23 0337  NA 131* 134* 129* 133* 134*  K 3.9 3.6 4.9 4.1 4.2  CL 96* 96* 96* 98 97*  CO2 22 27 21* 23 29  GLUCOSE 130* 92 96 128* 90  BUN 42* 23 49* 41* 26*  CREATININE 8.84* 5.08* 7.57* 6.33* 4.22*  CALCIUM  7.4* 7.6* 7.1* 8.0* 7.9*  PHOS 2.7 2.3* 2.6 2.5 2.5   CBC Recent Labs  Lab 05/09/23 0452 05/10/23 0338 05/11/23 0222 05/13/23 0800 05/14/23 0337  WBC 5.3  --  4.4 3.9* 4.6  HGB 7.5* 8.0* 7.8* 7.9* 8.2*  HCT 23.5* 25.2* 25.1* 25.0* 25.8*  MCV 95.1  --  97.7 97.3 97.0  PLT 158  --  180 176 204      Medications:     acetaminophen   1,000 mg Oral Q6H   cholecalciferol   5,000 Units Oral Once per day on Monday Thursday   darbepoetin (ARANESP ) injection - DIALYSIS  150 mcg Subcutaneous Q Mon-1800   gabapentin   300 mg Oral QHS   heparin  injection (subcutaneous)  5,000 Units Subcutaneous Q8H   insulin  aspart  0-6 Units Subcutaneous TID WC   midodrine   20 mg Oral TID WC   multivitamin  1 tablet Oral QHS   pantoprazole   40 mg Oral Daily   sertraline   25 mg Oral Daily   tamsulosin   0.4 mg Oral Daily   Gordy Blanch, MD 05/14/2023, 8:27 AM

## 2023-05-15 DIAGNOSIS — N179 Acute kidney failure, unspecified: Secondary | ICD-10-CM | POA: Diagnosis not present

## 2023-05-15 DIAGNOSIS — I951 Orthostatic hypotension: Secondary | ICD-10-CM | POA: Diagnosis not present

## 2023-05-15 DIAGNOSIS — N189 Chronic kidney disease, unspecified: Secondary | ICD-10-CM | POA: Diagnosis not present

## 2023-05-15 DIAGNOSIS — N186 End stage renal disease: Secondary | ICD-10-CM | POA: Diagnosis not present

## 2023-05-15 LAB — RENAL FUNCTION PANEL
Albumin: 2.5 g/dL — ABNORMAL LOW (ref 3.5–5.0)
Anion gap: 11 (ref 5–15)
BUN: 40 mg/dL — ABNORMAL HIGH (ref 8–23)
CO2: 26 mmol/L (ref 22–32)
Calcium: 8 mg/dL — ABNORMAL LOW (ref 8.9–10.3)
Chloride: 95 mmol/L — ABNORMAL LOW (ref 98–111)
Creatinine, Ser: 5.9 mg/dL — ABNORMAL HIGH (ref 0.61–1.24)
GFR, Estimated: 10 mL/min — ABNORMAL LOW (ref >60)
Glucose, Bld: 104 mg/dL — ABNORMAL HIGH (ref 70–99)
Phosphorus: 3.4 mg/dL (ref 2.5–4.6)
Potassium: 4.1 mmol/L (ref 3.5–5.1)
Sodium: 132 mmol/L — ABNORMAL LOW (ref 135–145)

## 2023-05-15 LAB — CBC
HCT: 25.8 % — ABNORMAL LOW (ref 39.0–52.0)
Hemoglobin: 8.2 g/dL — ABNORMAL LOW (ref 13.0–17.0)
MCH: 30.7 pg (ref 26.0–34.0)
MCHC: 31.8 g/dL (ref 30.0–36.0)
MCV: 96.6 fL (ref 80.0–100.0)
Platelets: 199 10*3/uL (ref 150–400)
RBC: 2.67 MIL/uL — ABNORMAL LOW (ref 4.22–5.81)
RDW: 15.9 % — ABNORMAL HIGH (ref 11.5–15.5)
WBC: 4.8 10*3/uL (ref 4.0–10.5)
nRBC: 0 % (ref 0.0–0.2)

## 2023-05-15 LAB — GLUCOSE, CAPILLARY
Glucose-Capillary: 93 mg/dL (ref 70–99)
Glucose-Capillary: 92 mg/dL (ref 70–99)
Glucose-Capillary: 133 mg/dL — ABNORMAL HIGH (ref 70–99)

## 2023-05-15 LAB — CORTISOL-AM, BLOOD
Cortisol - AM: 5.5 ug/dL — ABNORMAL LOW (ref 6.7–22.6)
Cortisol - AM: 8.5 ug/dL (ref 6.7–22.6)

## 2023-05-15 MED ORDER — COSYNTROPIN 0.25 MG IJ SOLR
0.2500 mg | Freq: Once | INTRAMUSCULAR | Status: AC
  Administered 2023-05-16: 0.25 mg via INTRAVENOUS
  Filled 2023-05-15 (×2): qty 0.25

## 2023-05-15 MED ORDER — ALBUMIN HUMAN 25 % IV SOLN
25.0000 g | Freq: Once | INTRAVENOUS | Status: AC
  Administered 2023-05-15: 25 g via INTRAVENOUS

## 2023-05-15 NOTE — Assessment & Plan Note (Signed)
 Resting BPs continue to be acceptable with 2 episodes of elevated blood pressures overnight.  Will continue to encourage him to be up and out of bed with assistance during PT sessions. - Abdominal binders, compression stockings -midodrine  20 mg 3 times daily and 10 mg with HD - Target MAP >60  -Follow-up a.m. cortisol

## 2023-05-15 NOTE — Plan of Care (Signed)
  Problem: Coping: Goal: Ability to adjust to condition or change in health will improve Outcome: Progressing   Problem: Fluid Volume: Goal: Ability to maintain a balanced intake and output will improve Outcome: Progressing   Problem: Health Behavior/Discharge Planning: Goal: Ability to identify and utilize available resources and services will improve Outcome: Progressing Goal: Ability to manage health-related needs will improve Outcome: Progressing   Problem: Metabolic: Goal: Ability to maintain appropriate glucose levels will improve Outcome: Progressing   Problem: Nutritional: Goal: Maintenance of adequate nutrition will improve Outcome: Progressing Goal: Progress toward achieving an optimal weight will improve Outcome: Progressing   Problem: Skin Integrity: Goal: Risk for impaired skin integrity will decrease Outcome: Progressing   Problem: Tissue Perfusion: Goal: Adequacy of tissue perfusion will improve Outcome: Progressing   Problem: Education: Goal: Knowledge of General Education information will improve Description: Including pain rating scale, medication(s)/side effects and non-pharmacologic comfort measures Outcome: Progressing   Problem: Health Behavior/Discharge Planning: Goal: Ability to manage health-related needs will improve Outcome: Progressing   Problem: Clinical Measurements: Goal: Ability to maintain clinical measurements within normal limits will improve Outcome: Progressing Goal: Will remain free from infection Outcome: Progressing Goal: Diagnostic test results will improve Outcome: Progressing Goal: Respiratory complications will improve Outcome: Progressing Goal: Cardiovascular complication will be avoided Outcome: Progressing   Problem: Activity: Goal: Risk for activity intolerance will decrease Outcome: Progressing   Problem: Nutrition: Goal: Adequate nutrition will be maintained Outcome: Progressing   Problem: Coping: Goal:  Level of anxiety will decrease Outcome: Progressing   Problem: Elimination: Goal: Will not experience complications related to bowel motility Outcome: Progressing Goal: Will not experience complications related to urinary retention Outcome: Progressing   Problem: Pain Managment: Goal: General experience of comfort will improve and/or be controlled Outcome: Progressing   Problem: Safety: Goal: Ability to remain free from injury will improve Outcome: Progressing   Problem: Skin Integrity: Goal: Risk for impaired skin integrity will decrease Outcome: Progressing   Problem: Education: Goal: Knowledge of disease and its progression will improve Outcome: Progressing Goal: Individualized Educational Video(s) Outcome: Progressing   Problem: Fluid Volume: Goal: Compliance with measures to maintain balanced fluid volume will improve Outcome: Progressing   Problem: Health Behavior/Discharge Planning: Goal: Ability to manage health-related needs will improve Outcome: Progressing   Problem: Nutritional: Goal: Ability to make healthy dietary choices will improve Outcome: Progressing   Problem: Clinical Measurements: Goal: Complications related to the disease process, condition or treatment will be avoided or minimized Outcome: Progressing

## 2023-05-15 NOTE — Progress Notes (Signed)
 PT Cancellation Note  Patient Details Name: Louis Butler MRN: [id number] DOB: [birth year]   Cancelled Treatment:    Reason Eval/Treat Not Completed: Patient at procedure or test/unavailable  Currently off unit for HD.  Will attempt visit for PT session this afternoon when he returns, as schedule permits.  Leontine Roads, PT, DPT [facility]  Rehabilitation Services Physical Therapist Office: [phone] Website: [email or web address]   Leontine GORMAN Roads 05/15/2023, 11:33 AM

## 2023-05-15 NOTE — Progress Notes (Signed)
     Daily Progress Note Intern Pager: [phone]  Patient name: Louis Butler Medical record number: [id number] Date of birth: January 16, 1956 Age: 68 y.o. Gender: male  Primary Care Provider: Donah Laymon PARAS, MD Consultants: Nephrology Code Status: Full  Pt Overview and Major Events to Date:  1/22: Admitted to ICU with CRRT 1/26: FM TS resumed care  Assessment and Plan:  Louis Butler is a 68 year old patient admitted for worsening ESRD requiring hemodialysis.  His orthostasis continues to be a barrier for discharge and we are proceeding with deeper workup at this time. Assessment & Plan Orthostatic hypotension Resting BPs continue to be acceptable with 2 episodes of elevated blood pressures overnight.  Will continue to encourage him to be up and out of bed with assistance during PT sessions. - Abdominal binders, compression stockings -midodrine  20 mg 3 times daily and 10 mg with HD - Target MAP >60  -Follow-up a.m. cortisol ESRD (end stage renal disease) (HCC) Stable.  Outpatient HD schedule will be Monday Wednesday Friday at [facility] kidney center. - Nephrology following, appreciate recommendations - Continue Midodrine , dosage increase listed above - RFP daily Anemia Stable.  Hemoglobin 8.2.  Transfusion threshold <7. - Monitor with CBC on dialysis days moving forward - Recommend OP follow-up   Chronic and Stable Problems:  HTN: Holding home medications in setting of orthostasis GERD: Protonix  40 mg daily T2DM: Very sensitive sliding scale insulin  Cancer of the skin and ear and external auditory canal: Stable, continue scheduled Tylenol  Depressed mood: Continue sertraline  25 mg daily   FEN/GI: Regular diet with fluid restriction PPx: Heparin  Dispo: Home or SNF  pending clinical improvement .    Subjective:  Patient was awake sitting up in bed watching television exam this morning with his wife at bedside.  He has no questions or concerns at this time and is feeling  well.  Objective: Temp:  [97.4 F (36.3 C)-98.7 F (37.1 C)] 98.4 F (36.9 C) (02/07 0409) Pulse Rate:  [62-76] 76 (02/07 0600) Resp:  [0-21] 14 (02/07 0600) BP: (119-170)/(64-75) 123/64 (02/07 0400) SpO2:  [94 %-98 %] 97 % (02/07 0600) Physical Exam: General: Elderly-appearing gentleman, no distress Cardiovascular: RRR, no M/R/G Respiratory: CTAB, no increased work of breathing Abdomen: Flat, soft, nontender Extremities: 2+ peripheral pulses in all 4 extremities, no evidence of peripheral edema  Laboratory: Most recent CBC Lab Results  Component Value Date   WBC 4.8 05/15/2023   HGB 8.2 (L) 05/15/2023   HCT 25.8 (L) 05/15/2023   MCV 96.6 05/15/2023   PLT 199 05/15/2023   Most recent BMP    Latest Ref Rng & Units 05/15/2023    4:05 AM  BMP  Glucose 70 - 99 mg/dL 895   BUN 8 - 23 mg/dL 40   Creatinine 9.38 - 1.24 mg/dL 4.09   Sodium 864 - 854 mmol/L 132   Potassium 3.5 - 5.1 mmol/L 4.1   Chloride 98 - 111 mmol/L 95   CO2 22 - 32 mmol/L 26   Calcium  8.9 - 10.3 mg/dL 8.0    Louis Lukes, DO 05/15/2023, 7:23 AM  PGY-1, [facility] Family [facility] FPTS Intern pager: [phone], text pages welcome Secure chat group [facility] Teaching Service

## 2023-05-15 NOTE — Procedures (Signed)
 Patient seen on Hemodialysis. BP (!) 115/54   Pulse 73   Temp 97.6 F (36.4 C)   Resp 12   Ht 6' 3 (1.905 m)   Wt 84.8 kg   SpO2 96%   BMI 23.37 kg/m   QB 400, UF goal 1.8L Tolerating treatment without complaints at this time.   Gordy Blanch MD [facility] Office # [phone] Pager # [phone] 10:59 AM

## 2023-05-15 NOTE — Assessment & Plan Note (Signed)
 Stable.  Hemoglobin 8.2.  Transfusion threshold <7. - Monitor with CBC on dialysis days moving forward - Recommend OP follow-up

## 2023-05-15 NOTE — Progress Notes (Signed)
 Contacted [facility] to provide update that snf shara is pending. Case discussed with CSW. Clinic advised that pt will more than likely not be at 1st treatment on Monday due to snf auth still pending and snf will likely not accept pt over the weekend per CSW. Will assist as needed.   Randine Mungo Renal Navigator [phone]

## 2023-05-15 NOTE — Progress Notes (Signed)
 OT Cancellation Note  Patient Details Name: Louis Butler MRN: [id number] DOB: 04/01/1956   Cancelled Treatment:    Reason Eval/Treat Not Completed: Patient at procedure or test/ unavailable (HD)  [name] 05/15/2023, 9:44 AM Mliss HERO, OTR/L Acute Rehabilitation Services Office: [phone]

## 2023-05-15 NOTE — TOC Progression Note (Signed)
 Transition of Care [facility] - Progression Note    Patient Details  Name: Louis Butler MRN: [id number] Date of Birth: 29-Sep-1955  Transition of Care [facility]) CM/SW Contact  Gwenn Frieze [place], [place] Phone Number: 05/15/2023, 9:06 AM  Clinical Narrative:   [facility] requesting updated MD notes showing medical stability which have been submitted. Auth remains pending at this time.   Frieze Gwenn, MSW, LCSW [phone] (coverage)      Expected Discharge Plan: Home w Home Health Services Barriers to Discharge: Continued Medical Work up  Expected Discharge Plan and Services   Discharge Planning Services: CM Consult Post Acute Care Choice: Home Health Living arrangements for the past 2 months: Single Family Home Expected Discharge Date: 05/11/23               DME Arranged: Hospital bed DME Agency: [employer] Date DME Agency Contacted: 05/04/23 Time DME Agency Contacted: [phone] Representative spoke with at DME Agency: Arthea HH Arranged: PT, Nurse's Aide HH Agency: [facility] Date [facility] Agency Contacted: 05/04/23 Time HH Agency Contacted: [phone] Representative spoke with at [facility] Agency: Amy   Social Determinants of Health (SDOH) Interventions SDOH Screenings   Food Insecurity: No Food Insecurity (05/08/2023)  Housing: Low Risk  (05/08/2023)  Transportation Needs: No Transportation Needs (05/08/2023)  Utilities: Not At Risk (05/08/2023)  Alcohol Screen: Low Risk  (12/01/2022)  Depression (PHQ2-9): Medium Risk (12/24/2022)  Financial Resource Strain: Low Risk  (12/01/2022)  Physical Activity: Insufficiently Active (12/01/2022)  Social Connections: Moderately Integrated (05/08/2023)  Stress: Stress Concern Present (12/01/2022)  Tobacco Use: Medium Risk (04/29/2023)  Health Literacy: Adequate Health Literacy (12/01/2022)    Readmission Risk Interventions    05/04/2023    4:35 PM  Readmission Risk Prevention Plan  Transportation Screening Complete  HRI or Home Care  Consult Complete  Social Work Consult for Recovery Care Planning/Counseling Complete  Palliative Care Screening Not Applicable  Medication Review Oceanographer) Referral to Pharmacy

## 2023-05-15 NOTE — Assessment & Plan Note (Signed)
 Stable.  Outpatient HD schedule will be Monday Wednesday Friday at [facility]. - Nephrology following, appreciate recommendations - Continue Midodrine , dosage increase listed above - RFP daily

## 2023-05-15 NOTE — Progress Notes (Signed)
   05/15/23 1300  Vitals  Temp 98.7 F (37.1 C)  Pulse Rate 78  Resp (!) 22  BP (!) 177/69  SpO2 100 %  Post Treatment  Dialyzer Clearance Lightly streaked  Hemodialysis Intake (mL) 100 mL  Liters Processed 90  Fluid Removed (mL) 900 mL  Tolerated HD Treatment Yes  AVG/AVF Arterial Site Held (minutes) 8 minutes  AVG/AVF Venous Site Held (minutes) 8 minutes   Received patient in bed to unit.  Alert and oriented.  Informed consent signed and in chart.   TX duration: 3.75hrs  Patient tolerated well.  Transported back to the room  Alert, without acute distress.  Hand-off given to patient's nurse.   Access used: LAU AVF Access issues: none  Total UF removed: Medication(s) given: midodrine , albumin    Na'Shaminy [facility]

## 2023-05-15 NOTE — Progress Notes (Signed)
 PT Cancellation Note  Patient Details Name: Louis Butler MRN: [id number] DOB: 1956-01-13   Cancelled Treatment:    Reason Eval/Treat Not Completed: Patient declined, no reason specified  Reports he is fatigued after dialysis and wishes to rest. Declines PT at this time. Will follow and progress as tolerated.   Louis Butler, PT, DPT [facility] Health  Rehabilitation Services Physical Therapist Office: [phone] Website: [email or web address]  Louis Butler 05/15/2023, 3:19 PM

## 2023-05-15 NOTE — Plan of Care (Signed)
  Problem: Fluid Volume: Goal: Ability to maintain a balanced intake and output will improve Outcome: Progressing   Problem: Skin Integrity: Goal: Risk for impaired skin integrity will decrease Outcome: Progressing   Problem: Tissue Perfusion: Goal: Adequacy of tissue perfusion will improve Outcome: Progressing   Problem: Activity: Goal: Risk for activity intolerance will decrease Outcome: Progressing   Problem: Pain Managment: Goal: General experience of comfort will improve and/or be controlled Outcome: Progressing   Problem: Safety: Goal: Ability to remain free from injury will improve Outcome: Progressing

## 2023-05-16 DIAGNOSIS — R4589 Other symptoms and signs involving emotional state: Secondary | ICD-10-CM

## 2023-05-16 DIAGNOSIS — N179 Acute kidney failure, unspecified: Secondary | ICD-10-CM | POA: Diagnosis not present

## 2023-05-16 DIAGNOSIS — I951 Orthostatic hypotension: Secondary | ICD-10-CM | POA: Diagnosis not present

## 2023-05-16 DIAGNOSIS — N186 End stage renal disease: Secondary | ICD-10-CM | POA: Diagnosis not present

## 2023-05-16 LAB — RENAL FUNCTION PANEL
Albumin: 2.8 g/dL — ABNORMAL LOW (ref 3.5–5.0)
Anion gap: 10 (ref 5–15)
BUN: 29 mg/dL — ABNORMAL HIGH (ref 8–23)
CO2: 28 mmol/L (ref 22–32)
Calcium: 8.5 mg/dL — ABNORMAL LOW (ref 8.9–10.3)
Chloride: 95 mmol/L — ABNORMAL LOW (ref 98–111)
Creatinine, Ser: 4.53 mg/dL — ABNORMAL HIGH (ref 0.61–1.24)
GFR, Estimated: 13 mL/min — ABNORMAL LOW (ref >60)
Glucose, Bld: 109 mg/dL — ABNORMAL HIGH (ref 70–99)
Phosphorus: 2.4 mg/dL — ABNORMAL LOW (ref 2.5–4.6)
Potassium: 4.1 mmol/L (ref 3.5–5.1)
Sodium: 133 mmol/L — ABNORMAL LOW (ref 135–145)

## 2023-05-16 LAB — CBC
HCT: 27.2 % — ABNORMAL LOW (ref 39.0–52.0)
Hemoglobin: 8.7 g/dL — ABNORMAL LOW (ref 13.0–17.0)
MCH: 31.1 pg (ref 26.0–34.0)
MCHC: 32 g/dL (ref 30.0–36.0)
MCV: 97.1 fL (ref 80.0–100.0)
Platelets: 198 10*3/uL (ref 150–400)
RBC: 2.8 MIL/uL — ABNORMAL LOW (ref 4.22–5.81)
RDW: 16.3 % — ABNORMAL HIGH (ref 11.5–15.5)
WBC: 5 10*3/uL (ref 4.0–10.5)
nRBC: 0 % (ref 0.0–0.2)

## 2023-05-16 LAB — ACTH STIMULATION, 3 TIME POINTS
Cortisol, 30 Min: 19.9 ug/dL
Cortisol, 60 Min: 23 ug/dL
Cortisol, Base: 14.5 ug/dL

## 2023-05-16 LAB — GLUCOSE, CAPILLARY: Glucose-Capillary: 110 mg/dL — ABNORMAL HIGH (ref 70–99)

## 2023-05-16 MED ORDER — ACETAMINOPHEN 500 MG PO TABS
1000.0000 mg | ORAL_TABLET | Freq: Four times a day (QID) | ORAL | Status: DC | PRN
  Filled 2023-05-16: qty 2

## 2023-05-16 MED ORDER — ONDANSETRON 4 MG PO TBDP
8.0000 mg | ORAL_TABLET | Freq: Three times a day (TID) | ORAL | Status: DC | PRN
  Administered 2023-05-16 – 2023-05-19 (×2): 8 mg via ORAL
  Filled 2023-05-16 (×3): qty 2

## 2023-05-16 MED ORDER — MIDODRINE HCL 5 MG PO TABS
10.0000 mg | ORAL_TABLET | Freq: Three times a day (TID) | ORAL | Status: DC
  Administered 2023-05-17 – 2023-06-12 (×68): 10 mg via ORAL
  Filled 2023-05-16 (×69): qty 2

## 2023-05-16 NOTE — Assessment & Plan Note (Addendum)
 On HD MWF which will continue outpatient at [facility]. - Nephrology following, appreciate recommendations - Midodrine  10 mg with HD sessions - RFP daily

## 2023-05-16 NOTE — Plan of Care (Signed)
  Problem: Coping: Goal: Ability to adjust to condition or change in health will improve Outcome: Progressing   Problem: Fluid Volume: Goal: Ability to maintain a balanced intake and output will improve Outcome: Progressing   Problem: Health Behavior/Discharge Planning: Goal: Ability to identify and utilize available resources and services will improve Outcome: Progressing Goal: Ability to manage health-related needs will improve Outcome: Progressing   Problem: Metabolic: Goal: Ability to maintain appropriate glucose levels will improve Outcome: Progressing   Problem: Nutritional: Goal: Maintenance of adequate nutrition will improve Outcome: Progressing Goal: Progress toward achieving an optimal weight will improve Outcome: Progressing   Problem: Skin Integrity: Goal: Risk for impaired skin integrity will decrease Outcome: Progressing   Problem: Education: Goal: Knowledge of General Education information will improve Description: Including pain rating scale, medication(s)/side effects and non-pharmacologic comfort measures Outcome: Progressing   Problem: Health Behavior/Discharge Planning: Goal: Ability to manage health-related needs will improve Outcome: Progressing   Problem: Clinical Measurements: Goal: Ability to maintain clinical measurements within normal limits will improve Outcome: Progressing Goal: Will remain free from infection Outcome: Progressing Goal: Diagnostic test results will improve Outcome: Progressing Goal: Respiratory complications will improve Outcome: Progressing Goal: Cardiovascular complication will be avoided Outcome: Progressing   Problem: Activity: Goal: Risk for activity intolerance will decrease Outcome: Progressing   Problem: Nutrition: Goal: Adequate nutrition will be maintained Outcome: Progressing   Problem: Coping: Goal: Level of anxiety will decrease Outcome: Progressing   Problem: Elimination: Goal: Will not  experience complications related to bowel motility Outcome: Progressing Goal: Will not experience complications related to urinary retention Outcome: Progressing   Problem: Pain Managment: Goal: General experience of comfort will improve and/or be controlled Outcome: Progressing   Problem: Safety: Goal: Ability to remain free from injury will improve Outcome: Progressing   Problem: Skin Integrity: Goal: Risk for impaired skin integrity will decrease Outcome: Progressing   Problem: Education: Goal: Knowledge of disease and its progression will improve Outcome: Progressing   Problem: Fluid Volume: Goal: Compliance with measures to maintain balanced fluid volume will improve Outcome: Progressing   Problem: Health Behavior/Discharge Planning: Goal: Ability to manage health-related needs will improve Outcome: Progressing   Problem: Nutritional: Goal: Ability to make healthy dietary choices will improve Outcome: Progressing   Problem: Clinical Measurements: Goal: Complications related to the disease process, condition or treatment will be avoided or minimized Outcome: Progressing

## 2023-05-16 NOTE — Progress Notes (Signed)
 Patient ID: Louis Butler, male   DOB: 1955/10/01, 68 y.o.   MRN: [id number] [facility] Progress Note   Assessment/ Plan:   1.  Deconditioning status post hospitalization: Awaiting reevaluation by physical therapy for discharge to skilled nursing facility; orthostatic blood pressures improved but not completely eliminated on midodrine . 2. ESRD: Continue hemodialysis on MWF schedule with next treatment due Monday.  He has been accepted to the [facility] to resume dialysis after discharge.  Awaiting SNF approval. 3. Anemia: Low but relatively stable hemoglobin and hematocrit.  There is some apprehension regarding ESA use with history of head and neck cancer and this decision will be shared/deferred to oncology. 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 5. Nutrition: Continue protein/nutritional supplementation along with regular diet. 6. Hypotension: Remains intermittently hypotensive while on high-dose midodrine  along with abdominal binder/compression stockings.  Surprisingly also has paradoxical supine hypertension.  Getting ACTH  stimulation test done today  Subjective:   Without acute events overnight, somewhat grumpy about ACTH  stimulation test procedure this morning.   Objective:   BP 131/67 (BP Location: Right Arm)   Pulse 68   Temp 98.2 F (36.8 C) (Oral)   Resp 16   Ht 6' 3 (1.905 m)   Wt 85 kg   SpO2 95%   BMI 23.42 kg/m   Physical Exam: Gen: Comfortably sitting up in bed eating breakfast CVS: Pulse regular rhythm, normal rate, S1 and S2 normal Resp: Clear to auscultation, no rales/rhonchi Abd: Soft, flat, nontender, bowel sounds normal Ext: No lower extremity edema.  Left upper arm AV fistula with intact dressings/palpable thrill  Labs: BMET Recent Labs  Lab 05/11/23 0222 05/13/23 0800 05/14/23 0337 05/15/23 0405  NA 129* 133* 134* 132*  K 4.9 4.1  4.2 4.1  CL 96* 98 97* 95*  CO2 21* 23 29 26   GLUCOSE 96 128* 90 104*  BUN 49* 41* 26* 40*  CREATININE 7.57* 6.33* 4.22* 5.90*  CALCIUM  7.1* 8.0* 7.9* 8.0*  PHOS 2.6 2.5 2.5 3.4   CBC Recent Labs  Lab 05/11/23 0222 05/13/23 0800 05/14/23 0337 05/15/23 0405  WBC 4.4 3.9* 4.6 4.8  HGB 7.8* 7.9* 8.2* 8.2*  HCT 25.1* 25.0* 25.8* 25.8*  MCV 97.7 97.3 97.0 96.6  PLT 180 176 204 199      Medications:     acetaminophen   1,000 mg Oral Q6H   cholecalciferol   5,000 Units Oral Once per day on Monday Thursday   cosyntropin   0.25 mg Intravenous Once   darbepoetin (ARANESP ) injection - DIALYSIS  150 mcg Subcutaneous Q Mon-1800   gabapentin   300 mg Oral QHS   heparin  injection (subcutaneous)  5,000 Units Subcutaneous Q8H   midodrine   20 mg Oral TID WC   multivitamin  1 tablet Oral QHS   pantoprazole   40 mg Oral Daily   sertraline   25 mg Oral Daily   tamsulosin   0.4 mg Oral Daily   Gordy Blanch, MD 05/16/2023, 8:46 AM

## 2023-05-16 NOTE — Progress Notes (Addendum)
 Daily Progress Note Intern Pager: [phone]  Patient name: Louis Butler Medical record number: [id number] Date of birth: July 11, 1955 Age: 68 y.o. Gender: male  Primary Care Provider: Donah Laymon PARAS, MD Consultants: Nephrology Code Status: Full Code  Pt Overview and Major Events to Date:  1/22: Admitted to ICU for CRRT 1/26: FMTS took over care  Assessment and Plan: Louis Butler is a 68 yo M with past medical history of HTN, T2DM, ESRD on HD (previously discontinued), GERD, and Skin and ear cancer who was admitted for worsening ERSD requiring CRRT and resuming HD.  Recently he has been suffering from orthostatic hypotension which has been a barrier to discharge to SNF.  He continues to get HD which seems to worsen his hypotension. Currently we are working him up further with an ACTH  stimulation test. Assessment & Plan ESRD (end stage renal disease) (HCC) On HD MWF which will continue outpatient at [facility]. - Nephrology following, appreciate recommendations - Midodrine  10 mg with HD sessions - RFP daily Orthostatic hypotension BP elevated during the day yesterday and overnight. Stable this AM.  MAPs stable at 70-100.  Will continue to encourage him to be up and out of bed with assistance during PT sessions. - Midodrine  20 mg TID and 10 mg with HD - Target MAP >60  - Abdominal binders, compression stockings - ACTH  stimulation testing pending results Anemia Stable.  Hemoglobin 8.2.  Transfusion threshold <7. 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 - Follow-up with [facility] - Scheduled tylenol  1000mg  q6h for pain Malnutrition of moderate degree RD following, appreciate recommendations. Ulcer of sacral region, stage 2 (HCC) - WOC, frequent turns - RD recs Depressed mood Depressed mood recently. Sertraline  25mg  started while inpatient. - Continue  Sertraline  25 mg daily   Chronic and Stable Problems:  HTN: Holding home medications in setting of orthostasis GERD: Protonix  40 mg daily T2DM: Very sensitive sliding scale insulin  Cancer of the skin, ear, & external auditory canal: Stable, continue scheduled Tylenol  Depression: Continue sertraline  25 mg daily  FEN/GI: Regular diet with fluid restriction  PPx: Heparin  Dispo: Pending improvement of orthostasis and SNF placement  Subjective:  Patient is doing well this morning.  He was frustrated that he had to be stuck repeatedly for blood.  Otherwise complains of no other symptoms. He can walk short distances just fine, like to the bathroom,  but has more difficulty walking around the unit with PT, which sometimes causes some lightheadedness/dizziness.   Objective: Temp:  [97.8 F (36.6 C)-98.8 F (37.1 C)] 98.2 F (36.8 C) (02/08 0735) Pulse Rate:  [60-78] 68 (02/08 0800) Resp:  [11-22] 16 (02/08 0800) BP: (84-177)/(47-134) 131/67 (02/08 0800) SpO2:  [90 %-100 %] 95 % (02/08 0800) Weight:  [85 kg] 85 kg (02/08 0500) Physical Exam: General: Elderly appearing male, awake and alert in NAD. Cardiovascular: RRR.  No M/R/G. Respiratory: CTAB.  Normal WOB on RA. Abdomen: Soft, nontender, nondistended.  Normoactive bowel sounds. Extremities: No BLE edema.  Able to move all extremities.  Laboratory: Most recent CBC Lab Results  Component Value Date   WBC 5.0 05/16/2023   HGB 8.7 (L) 05/16/2023   HCT 27.2 (L) 05/16/2023   MCV 97.1 05/16/2023   PLT 198 05/16/2023   Most recent BMP    Latest Ref Rng & Units 05/16/2023    9:04 AM  BMP  Glucose 70 -  99 mg/dL 890   BUN 8 - 23 mg/dL 29   Creatinine 9.38 - 1.24 mg/dL 5.46   Sodium 864 - 854 mmol/L 133   Potassium 3.5 - 5.1 mmol/L 4.1   Chloride 98 - 111 mmol/L 95   CO2 22 - 32 mmol/L 28   Calcium  8.9 - 10.3 mg/dL 8.5     Imaging/Diagnostic Tests: No new imaging.  Janna Ferrier, DO 05/16/2023, 9:45 AM  PGY-1, [facility] Health  Family Medicine FPTS Intern pager: [phone], text pages welcome Secure chat group [facility] Teaching Service

## 2023-05-16 NOTE — Assessment & Plan Note (Signed)
 RD following, appreciate recommendations.

## 2023-05-16 NOTE — Assessment & Plan Note (Addendum)
-  WOC, frequent turns -RD recs

## 2023-05-16 NOTE — Assessment & Plan Note (Addendum)
 Stable.  Hemoglobin 8.2.  Transfusion threshold <7. - Monitor with CBC on dialysis days moving forward - Recommend OP follow-up

## 2023-05-16 NOTE — Assessment & Plan Note (Signed)
 BP elevated during the day yesterday and overnight. Stable this AM.  MAPs stable at 70-100.  Will continue to encourage him to be up and out of bed with assistance during PT sessions. - Midodrine  20 mg TID and 10 mg with HD - Target MAP >60  - Abdominal binders, compression stockings - ACTH  stimulation testing pending results

## 2023-05-16 NOTE — Assessment & Plan Note (Addendum)
 Depressed mood recently. Sertraline 25mg  started while inpatient. - Continue Sertraline 25 mg daily

## 2023-05-16 NOTE — TOC Progression Note (Signed)
 Transition of Care [facility]) - Progression Note    Patient Details  Name: Louis Butler MRN: [id number] Date of Birth: 03-14-1956  Transition of [facility]) CM/SW Contact  Gwenn Frieze [place], [place] Phone Number: 05/16/2023, 8:23 AM  Clinical Narrative: Per Home [facility] Community/[facility], pt's auth request remains pending and is awaiting med review. Will provide updates as available.   Frieze Gwenn, MSW, LCSW [phone] (coverage)        Expected Discharge Plan: Home w Home Health Services Barriers to Discharge: Continued Medical Work up  Expected Discharge Plan and Services   Discharge Planning Services: CM Consult Post Acute Care Choice: Home Health Living arrangements for the past 2 months: Single Family Home Expected Discharge Date: 05/11/23               DME Arranged: Hospital bed DME Agency: [employer] Date DME Agency Contacted: 05/04/23 Time DME Agency Contacted: [phone] Representative spoke with at DME Agency: Arthea HH Arranged: PT, Nurse's Aide HH Agency: [facility] Date [facility] Agency Contacted: 05/04/23 Time HH Agency Contacted: [phone] Representative spoke with at [facility] Agency: Amy   Social Determinants of Health (SDOH) Interventions SDOH Screenings   Food Insecurity: No Food Insecurity (05/08/2023)  Housing: Low Risk  (05/08/2023)  Transportation Needs: No Transportation Needs (05/08/2023)  Utilities: Not At Risk (05/08/2023)  Alcohol Screen: Low Risk  (12/01/2022)  Depression (PHQ2-9): Medium Risk (12/24/2022)  Financial Resource Strain: Low Risk  (12/01/2022)  Physical Activity: Insufficiently Active (12/01/2022)  Social Connections: Moderately Integrated (05/08/2023)  Stress: Stress Concern Present (12/01/2022)  Tobacco Use: Medium Risk (04/29/2023)  Health Literacy: Adequate Health Literacy (12/01/2022)    Readmission Risk Interventions    05/04/2023    4:35 PM  Readmission Risk Prevention Plan  Transportation Screening Complete  HRI or Home Care Consult  Complete  Social Work Consult for Recovery Care Planning/Counseling Complete  Palliative Care Screening Not Applicable  Medication Review Oceanographer) Referral to Pharmacy

## 2023-05-16 NOTE — Progress Notes (Signed)
 Pt. Refused peri care and foley care from RN and/or NT. Pt states he wants to do it himself. Pt educated that it's best for the NT or RN to perform this for proper cleanliness. I explained the risks of not having foley care completed and/or the possible risk of it not being properly cleansed when not completed by the NT/RN. Risks such as UTI were explained, however pt became increasingly agitated and said, I refuse. I'll do it myself.

## 2023-05-16 NOTE — Assessment & Plan Note (Addendum)
 Stable. - Follow-up with [facility] - Scheduled tylenol 1000mg  q6h for pain

## 2023-05-17 DIAGNOSIS — N179 Acute kidney failure, unspecified: Secondary | ICD-10-CM | POA: Diagnosis not present

## 2023-05-17 DIAGNOSIS — R531 Weakness: Secondary | ICD-10-CM

## 2023-05-17 DIAGNOSIS — R4589 Other symptoms and signs involving emotional state: Secondary | ICD-10-CM | POA: Diagnosis not present

## 2023-05-17 DIAGNOSIS — I951 Orthostatic hypotension: Secondary | ICD-10-CM | POA: Diagnosis not present

## 2023-05-17 DIAGNOSIS — N186 End stage renal disease: Secondary | ICD-10-CM | POA: Diagnosis not present

## 2023-05-17 LAB — RENAL FUNCTION PANEL
Albumin: 2.8 g/dL — ABNORMAL LOW (ref 3.5–5.0)
Anion gap: 13 (ref 5–15)
BUN: 42 mg/dL — ABNORMAL HIGH (ref 8–23)
CO2: 25 mmol/L (ref 22–32)
Calcium: 8.6 mg/dL — ABNORMAL LOW (ref 8.9–10.3)
Chloride: 96 mmol/L — ABNORMAL LOW (ref 98–111)
Creatinine, Ser: 5.74 mg/dL — ABNORMAL HIGH (ref 0.61–1.24)
GFR, Estimated: 10 mL/min — ABNORMAL LOW (ref >60)
Glucose, Bld: 139 mg/dL — ABNORMAL HIGH (ref 70–99)
Phosphorus: 2.7 mg/dL (ref 2.5–4.6)
Potassium: 3.8 mmol/L (ref 3.5–5.1)
Sodium: 134 mmol/L — ABNORMAL LOW (ref 135–145)

## 2023-05-17 LAB — CBC
HCT: 26.2 % — ABNORMAL LOW (ref 39.0–52.0)
Hemoglobin: 8.4 g/dL — ABNORMAL LOW (ref 13.0–17.0)
MCH: 31.3 pg (ref 26.0–34.0)
MCHC: 32.1 g/dL (ref 30.0–36.0)
MCV: 97.8 fL (ref 80.0–100.0)
Platelets: 195 10*3/uL (ref 150–400)
RBC: 2.68 MIL/uL — ABNORMAL LOW (ref 4.22–5.81)
RDW: 17.1 % — ABNORMAL HIGH (ref 11.5–15.5)
WBC: 4.7 10*3/uL (ref 4.0–10.5)
nRBC: 0 % (ref 0.0–0.2)

## 2023-05-17 MED ORDER — CAMPHOR-MENTHOL 0.5-0.5 % EX LOTN
1.0000 | TOPICAL_LOTION | CUTANEOUS | Status: DC | PRN
  Filled 2023-05-17: qty 222

## 2023-05-17 MED ORDER — FLUDROCORTISONE ACETATE 0.1 MG PO TABS
0.1000 mg | ORAL_TABLET | Freq: Every day | ORAL | Status: DC
  Administered 2023-05-17: 0.1 mg via ORAL
  Filled 2023-05-17 (×3): qty 1

## 2023-05-17 NOTE — Plan of Care (Signed)
 Problem: Coping: Goal: Ability to adjust to condition or change in health will improve 05/17/2023 2311 by Valorie Inocente NOVAK, RN Outcome: Progressing 05/17/2023 2305 by Valorie Inocente NOVAK, RN Outcome: Progressing   Problem: Fluid Volume: Goal: Ability to maintain a balanced intake and output will improve 05/17/2023 2311 by Valorie Inocente NOVAK, RN Outcome: Progressing 05/17/2023 2305 by Valorie Inocente NOVAK, RN Outcome: Progressing   Problem: Health Behavior/Discharge Planning: Goal: Ability to identify and utilize available resources and services will improve 05/17/2023 2311 by Valorie Inocente NOVAK, RN Outcome: Progressing 05/17/2023 2305 by Valorie Inocente NOVAK, RN Outcome: Progressing Goal: Ability to manage health-related needs will improve 05/17/2023 2311 by Valorie Inocente NOVAK, RN Outcome: Progressing 05/17/2023 2305 by Valorie Inocente NOVAK, RN Outcome: Progressing   Problem: Metabolic: Goal: Ability to maintain appropriate glucose levels will improve 05/17/2023 2311 by Valorie Inocente NOVAK, RN Outcome: Progressing 05/17/2023 2305 by Valorie Inocente NOVAK, RN Outcome: Progressing   Problem: Nutritional: Goal: Maintenance of adequate nutrition will improve 05/17/2023 2311 by Valorie Inocente NOVAK, RN Outcome: Progressing 05/17/2023 2305 by Valorie Inocente NOVAK, RN Outcome: Progressing Goal: Progress toward achieving an optimal weight will improve 05/17/2023 2311 by Valorie Inocente NOVAK, RN Outcome: Progressing 05/17/2023 2305 by Valorie Inocente NOVAK, RN Outcome: Progressing   Problem: Skin Integrity: Goal: Risk for impaired skin integrity will decrease 05/17/2023 2311 by Valorie Inocente NOVAK, RN Outcome: Progressing 05/17/2023 2305 by Valorie Inocente NOVAK, RN Outcome: Progressing   Problem: Education: Goal: Knowledge of General Education information will improve Description: Including pain rating scale, medication(s)/side effects and non-pharmacologic comfort measures 05/17/2023 2311 by Valorie Inocente NOVAK, RN Outcome: Progressing 05/17/2023 2305 by  Valorie Inocente NOVAK, RN Outcome: Progressing   Problem: Health Behavior/Discharge Planning: Goal: Ability to manage health-related needs will improve 05/17/2023 2311 by Valorie Inocente NOVAK, RN Outcome: Progressing 05/17/2023 2305 by Valorie Inocente NOVAK, RN Outcome: Progressing   Problem: Clinical Measurements: Goal: Ability to maintain clinical measurements within normal limits will improve 05/17/2023 2311 by Valorie Inocente NOVAK, RN Outcome: Progressing 05/17/2023 2305 by Valorie Inocente NOVAK, RN Outcome: Progressing Goal: Will remain free from infection 05/17/2023 2311 by Valorie Inocente NOVAK, RN Outcome: Progressing 05/17/2023 2305 by Valorie Inocente NOVAK, RN Outcome: Progressing Goal: Diagnostic test results will improve 05/17/2023 2311 by Valorie Inocente NOVAK, RN Outcome: Progressing 05/17/2023 2305 by Valorie Inocente NOVAK, RN Outcome: Progressing Goal: Respiratory complications will improve 05/17/2023 2311 by Valorie Inocente NOVAK, RN Outcome: Progressing 05/17/2023 2305 by Valorie Inocente NOVAK, RN Outcome: Progressing Goal: Cardiovascular complication will be avoided 05/17/2023 2311 by Valorie Inocente NOVAK, RN Outcome: Progressing 05/17/2023 2305 by Valorie Inocente NOVAK, RN Outcome: Progressing   Problem: Activity: Goal: Risk for activity intolerance will decrease 05/17/2023 2311 by Valorie Inocente NOVAK, RN Outcome: Progressing 05/17/2023 2305 by Valorie Inocente NOVAK, RN Outcome: Progressing   Problem: Nutrition: Goal: Adequate nutrition will be maintained 05/17/2023 2311 by Valorie Inocente NOVAK, RN Outcome: Progressing 05/17/2023 2305 by Valorie Inocente NOVAK, RN Outcome: Progressing   Problem: Coping: Goal: Level of anxiety will decrease 05/17/2023 2311 by Valorie Inocente NOVAK, RN Outcome: Progressing 05/17/2023 2305 by Valorie Inocente NOVAK, RN Outcome: Progressing   Problem: Elimination: Goal: Will not experience complications related to bowel motility 05/17/2023 2311 by Valorie Inocente NOVAK, RN Outcome: Progressing 05/17/2023 2305 by Valorie Inocente NOVAK,  RN Outcome: Progressing Goal: Will not experience complications related to urinary retention 05/17/2023 2311 by Valorie Inocente NOVAK, RN Outcome: Progressing 05/17/2023 2305 by Valorie Inocente NOVAK, RN Outcome: Progressing   Problem: Pain Managment: Goal: General  experience of comfort will improve and/or be controlled 05/17/2023 2311 by Valorie Inocente NOVAK, RN Outcome: Progressing 05/17/2023 2305 by Valorie Inocente NOVAK, RN Outcome: Progressing   Problem: Safety: Goal: Ability to remain free from injury will improve 05/17/2023 2311 by Valorie Inocente NOVAK, RN Outcome: Progressing 05/17/2023 2305 by Valorie Inocente NOVAK, RN Outcome: Progressing   Problem: Skin Integrity: Goal: Risk for impaired skin integrity will decrease 05/17/2023 2311 by Valorie Inocente NOVAK, RN Outcome: Progressing 05/17/2023 2305 by Valorie Inocente NOVAK, RN Outcome: Progressing   Problem: Education: Goal: Knowledge of disease and its progression will improve 05/17/2023 2311 by Valorie Inocente NOVAK, RN Outcome: Progressing 05/17/2023 2305 by Valorie Inocente NOVAK, RN Outcome: Progressing   Problem: Fluid Volume: Goal: Compliance with measures to maintain balanced fluid volume will improve 05/17/2023 2311 by Valorie Inocente NOVAK, RN Outcome: Progressing 05/17/2023 2305 by Valorie Inocente NOVAK, RN Outcome: Progressing   Problem: Health Behavior/Discharge Planning: Goal: Ability to manage health-related needs will improve 05/17/2023 2311 by Valorie Inocente NOVAK, RN Outcome: Progressing 05/17/2023 2305 by Valorie Inocente NOVAK, RN Outcome: Progressing   Problem: Nutritional: Goal: Ability to make healthy dietary choices will improve 05/17/2023 2311 by Valorie Inocente NOVAK, RN Outcome: Progressing 05/17/2023 2305 by Valorie Inocente NOVAK, RN Outcome: Progressing   Problem: Clinical Measurements: Goal: Complications related to the disease process, condition or treatment will be avoided or minimized 05/17/2023 2311 by Valorie Inocente NOVAK, RN Outcome: Progressing 05/17/2023 2305 by Valorie Inocente NOVAK, RN Outcome: Progressing

## 2023-05-17 NOTE — Assessment & Plan Note (Signed)
 Stable. - Follow-up with [facility] - Scheduled tylenol 1000mg  q6h for pain

## 2023-05-17 NOTE — Progress Notes (Signed)
 Daily Progress Note Intern Pager: [phone]  Patient name: Louis Butler Medical record number: [id number] Date of birth: 12-15-1955 Age: 68 y.o. Gender: male  Primary Care Provider: Donah Laymon PARAS, MD Consultants: Nephrology Code Status: Full Code  Pt Overview and Major Events to Date:  1/22: Admitted to ICU for CRRT 1/26: FMTS took over care  Assessment and Plan:  Louis Butler is a 68 yo M with past medical history of HTN, T2DM, ESRD on HD (previously discontinued), GERD, and Skin and ear cancer who was admitted for worsening ERSD requiring CRRT and resuming HD.  Recently he has been suffering from orthostatic hypotension which has been a barrier to discharge to SNF.  He continues to get HD which seems to worsen his hypotension. Assessment & Plan ESRD (end stage renal disease) (HCC) On HD MWF which will continue outpatient at [facility]. - Nephrology following, appreciate recommendations - Midodrine  10 mg with HD sessions - RFP daily Orthostatic hypotension BP normal during the day yesterday and overnight. ACTH  stim test normal, no signs of adrenal dysfunction. BP Stable this AM, w/ MAPs ranging from 80-100s.  Midodrine  lowered yesterday, will continue at current dose and watch BP/orthostais. Encourage pt to be up and out of bed with assistance during PT sessions. - Midodrine  10mg  TID and 10 mg with HD - Target MAP >60  - Abdominal binders, compression stockings  Anemia Stable.  Hemoglobin 8.7.  Transfusion threshold <7. 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 - Follow-up with [facility] - Scheduled tylenol  1000mg  q6h for pain Malnutrition of moderate degree RD following, appreciate recommendations. Ulcer of sacral region, stage 2 (HCC) - WOC, frequent turns - RD recs Depressed mood Depressed mood recently. Sertraline  25mg  started while inpatient. -  Continue Sertraline  25 mg daily   Chronic and Stable Issues: HTN: Holding home medications in setting of orthostasis GERD: Protonix  40 mg daily T2DM: Very sensitive sliding scale insulin  Cancer of the skin, ear, & external auditory canal: Stable, continue scheduled Tylenol  Depression: Continue sertraline  25 mg daily  FEN/GI: Regular Diet w/ fluid restriction PPx: Heparin  Dispo:SNF pending clinical improvement . Barriers include hypotension.   Subjective:  Doing well, no concerns  Objective: Temp:  [97.8 F (36.6 C)-98.2 F (36.8 C)] 98 F (36.7 C) (02/09 0000) Pulse Rate:  [61-91] 74 (02/08 2200) Resp:  [14-18] 16 (02/08 2200) BP: (130-156)/(57-67) 142/63 (02/08 2000) SpO2:  [94 %-98 %] 97 % (02/08 2200) Weight:  [85 kg] 85 kg (02/08 0500) Physical Exam: General: NAD, good mood Cardiovascular: RRR, NRMG Respiratory: CTABL, normal WOB Abdomen: Soft, NTTP, non-distended Extremities: moving all extremities, no edema  Laboratory: Most recent CBC Lab Results  Component Value Date   WBC 5.0 05/16/2023   HGB 8.7 (L) 05/16/2023   HCT 27.2 (L) 05/16/2023   MCV 97.1 05/16/2023   PLT 198 05/16/2023   Most recent BMP    Latest Ref Rng & Units 05/16/2023    9:04 AM  BMP  Glucose 70 - 99 mg/dL 890   BUN 8 - 23 mg/dL 29   Creatinine 9.38 - 1.24 mg/dL 5.46   Sodium 864 - 854 mmol/L 133   Potassium 3.5 - 5.1 mmol/L 4.1   Chloride 98 - 111 mmol/L 95   CO2 22 - 32 mmol/L 28   Calcium  8.9 - 10.3 mg/dL 8.5     Jennelle Riis, MD 05/17/2023,  12:10 AM  PGY-3, [facility] FPTS Intern pager: [phone], text pages welcome Secure chat group [facility] Teaching Service

## 2023-05-17 NOTE — Plan of Care (Signed)
  Problem: Coping: Goal: Ability to adjust to condition or change in health will improve Outcome: Progressing   Problem: Fluid Volume: Goal: Ability to maintain a balanced intake and output will improve Outcome: Progressing   Problem: Health Behavior/Discharge Planning: Goal: Ability to identify and utilize available resources and services will improve Outcome: Progressing Goal: Ability to manage health-related needs will improve Outcome: Progressing   Problem: Metabolic: Goal: Ability to maintain appropriate glucose levels will improve Outcome: Progressing   Problem: Nutritional: Goal: Maintenance of adequate nutrition will improve Outcome: Progressing Goal: Progress toward achieving an optimal weight will improve Outcome: Progressing   Problem: Skin Integrity: Goal: Risk for impaired skin integrity will decrease Outcome: Progressing   Problem: Education: Goal: Knowledge of General Education information will improve Description: Including pain rating scale, medication(s)/side effects and non-pharmacologic comfort measures Outcome: Progressing   Problem: Health Behavior/Discharge Planning: Goal: Ability to manage health-related needs will improve Outcome: Progressing   Problem: Clinical Measurements: Goal: Ability to maintain clinical measurements within normal limits will improve Outcome: Progressing Goal: Will remain free from infection Outcome: Progressing Goal: Diagnostic test results will improve Outcome: Progressing Goal: Respiratory complications will improve Outcome: Progressing Goal: Cardiovascular complication will be avoided Outcome: Progressing   Problem: Activity: Goal: Risk for activity intolerance will decrease Outcome: Progressing   Problem: Nutrition: Goal: Adequate nutrition will be maintained Outcome: Progressing   Problem: Coping: Goal: Level of anxiety will decrease Outcome: Progressing   Problem: Elimination: Goal: Will not  experience complications related to bowel motility Outcome: Progressing Goal: Will not experience complications related to urinary retention Outcome: Progressing   Problem: Pain Managment: Goal: General experience of comfort will improve and/or be controlled Outcome: Progressing   Problem: Safety: Goal: Ability to remain free from injury will improve Outcome: Progressing   Problem: Skin Integrity: Goal: Risk for impaired skin integrity will decrease Outcome: Progressing   Problem: Education: Goal: Knowledge of disease and its progression will improve Outcome: Progressing   Problem: Fluid Volume: Goal: Compliance with measures to maintain balanced fluid volume will improve Outcome: Progressing   Problem: Health Behavior/Discharge Planning: Goal: Ability to manage health-related needs will improve Outcome: Progressing   Problem: Nutritional: Goal: Ability to make healthy dietary choices will improve Outcome: Progressing   Problem: Clinical Measurements: Goal: Complications related to the disease process, condition or treatment will be avoided or minimized Outcome: Progressing

## 2023-05-17 NOTE — Progress Notes (Signed)
 Patient ID: JAEDON SILER, male   DOB: April 19, 1955, 68 y.o.   MRN: [id number] [facility] Progress Note   Assessment/ Plan:   1.  Deconditioning status post hospitalization: Awaiting reevaluation by physical therapy for discharge to skilled nursing facility; orthostatic blood pressures appear to be improving on midodrine . 2. ESRD: Continue hemodialysis on MWF schedule with next treatment ordered for tomorrow.  He has been accepted to the [facility] kidney [facility] to resume dialysis after discharge.  Awaiting SNF approval. 3. Anemia: Low but relatively stable hemoglobin and hematocrit.  Holding off on ESA with recent head/neck cancer. 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 5. Nutrition: Continue protein/nutritional supplementation along with regular diet. 6. Hypotension: ACTH  stimulation test negative for adrenal insufficiency.  Midodrine  dose decreased because of hypotension yesterday and hold parameter placed in case SBP >120.  Subjective:   Underwent ACTH  stimulation test yesterday without evidence of adrenal insufficiency.  Noted to be hypertensive prompting decrease of midodrine  dose and hold parameters ordered.   Objective:   BP (!) 131/90 (BP Location: Right Arm)   Pulse 71   Temp (!) 97.4 F (36.3 C) (Oral)   Resp 14   Ht 6' 3 (1.905 m)   Wt 86 kg   SpO2 96%   BMI 23.70 kg/m   Physical Exam: Gen: Resting comfortably in bed, awakens to conversation CVS: Pulse regular rhythm, normal rate, S1 and S2 normal Resp: Clear to auscultation, no rales/rhonchi Abd: Soft, flat, nontender, bowel sounds normal Ext: No lower extremity edema.  Left upper arm AV fistula with intact dressings/palpable thrill  Labs: BMET Recent Labs  Lab 05/11/23 0222 05/13/23 0800 05/14/23 0337 05/15/23 0405 05/16/23 0904  NA 129* 133* 134* 132* 133*  K 4.9 4.1 4.2 4.1 4.1  CL 96* 98 97* 95* 95*   CO2 21* 23 29 26 28   GLUCOSE 96 128* 90 104* 109*  BUN 49* 41* 26* 40* 29*  CREATININE 7.57* 6.33* 4.22* 5.90* 4.53*  CALCIUM  7.1* 8.0* 7.9* 8.0* 8.5*  PHOS 2.6 2.5 2.5 3.4 2.4*   CBC Recent Labs  Lab 05/14/23 0337 05/15/23 0405 05/16/23 0904 05/17/23 0822  WBC 4.6 4.8 5.0 4.7  HGB 8.2* 8.2* 8.7* 8.4*  HCT 25.8* 25.8* 27.2* 26.2*  MCV 97.0 96.6 97.1 97.8  PLT 204 199 198 195      Medications:     cholecalciferol   5,000 Units Oral Once per day on Monday Thursday   darbepoetin (ARANESP ) injection - DIALYSIS  150 mcg Subcutaneous Q Mon-1800   gabapentin   300 mg Oral QHS   heparin  injection (subcutaneous)  5,000 Units Subcutaneous Q8H   midodrine   10 mg Oral TID WC   multivitamin  1 tablet Oral QHS   pantoprazole   40 mg Oral Daily   sertraline   25 mg Oral Daily   tamsulosin   0.4 mg Oral Daily   Gordy Blanch, MD 05/17/2023, 8:51 AM

## 2023-05-17 NOTE — Assessment & Plan Note (Addendum)
 Stable.  Hemoglobin 8.7.  Transfusion threshold <7. - Monitor with CBC on dialysis days moving forward - Recommend OP follow-up

## 2023-05-17 NOTE — Assessment & Plan Note (Signed)
-  WOC, frequent turns -RD recs

## 2023-05-17 NOTE — Assessment & Plan Note (Signed)
 Depressed mood recently. Sertraline 25mg  started while inpatient. - Continue Sertraline 25 mg daily

## 2023-05-17 NOTE — Assessment & Plan Note (Signed)
 On HD MWF which will continue outpatient at [facility]. - Nephrology following, appreciate recommendations - Midodrine  10 mg with HD sessions - RFP daily

## 2023-05-17 NOTE — Assessment & Plan Note (Signed)
 RD following, appreciate recommendations.

## 2023-05-17 NOTE — Assessment & Plan Note (Addendum)
 BP normal during the day yesterday and overnight. ACTH  stim test normal, no signs of adrenal dysfunction. BP Stable this AM, w/ MAPs ranging from 80-100s.  Midodrine  lowered yesterday, will continue at current dose and watch BP/orthostais. Encourage pt to be up and out of bed with assistance during PT sessions. - Midodrine  10mg  TID and 10 mg with HD - Target MAP >60  - Abdominal binders, compression stockings

## 2023-05-18 DIAGNOSIS — I951 Orthostatic hypotension: Secondary | ICD-10-CM | POA: Diagnosis not present

## 2023-05-18 DIAGNOSIS — N189 Chronic kidney disease, unspecified: Secondary | ICD-10-CM | POA: Diagnosis not present

## 2023-05-18 DIAGNOSIS — N179 Acute kidney failure, unspecified: Secondary | ICD-10-CM | POA: Diagnosis not present

## 2023-05-18 LAB — RENAL FUNCTION PANEL
Albumin: 2.7 g/dL — ABNORMAL LOW (ref 3.5–5.0)
Anion gap: 14 (ref 5–15)
BUN: 67 mg/dL — ABNORMAL HIGH (ref 8–23)
CO2: 23 mmol/L (ref 22–32)
Calcium: 8.4 mg/dL — ABNORMAL LOW (ref 8.9–10.3)
Chloride: 96 mmol/L — ABNORMAL LOW (ref 98–111)
Creatinine, Ser: 6.83 mg/dL — ABNORMAL HIGH (ref 0.61–1.24)
GFR, Estimated: 8 mL/min — ABNORMAL LOW (ref >60)
Glucose, Bld: 119 mg/dL — ABNORMAL HIGH (ref 70–99)
Phosphorus: 3.3 mg/dL (ref 2.5–4.6)
Potassium: 4.2 mmol/L (ref 3.5–5.1)
Sodium: 133 mmol/L — ABNORMAL LOW (ref 135–145)

## 2023-05-18 LAB — CBC
HCT: 26.2 % — ABNORMAL LOW (ref 39.0–52.0)
Hemoglobin: 8.2 g/dL — ABNORMAL LOW (ref 13.0–17.0)
MCH: 30.8 pg (ref 26.0–34.0)
MCHC: 31.3 g/dL (ref 30.0–36.0)
MCV: 98.5 fL (ref 80.0–100.0)
Platelets: 216 10*3/uL (ref 150–400)
RBC: 2.66 MIL/uL — ABNORMAL LOW (ref 4.22–5.81)
RDW: 17.2 % — ABNORMAL HIGH (ref 11.5–15.5)
WBC: 5.5 10*3/uL (ref 4.0–10.5)
nRBC: 0 % (ref 0.0–0.2)

## 2023-05-18 MED ORDER — ALBUMIN HUMAN 25 % IV SOLN
25.0000 g | Freq: Once | INTRAVENOUS | Status: AC
  Administered 2023-05-18: 25 g via INTRAVENOUS

## 2023-05-18 NOTE — Assessment & Plan Note (Signed)
 RD following, appreciate recommendations.

## 2023-05-18 NOTE — Assessment & Plan Note (Signed)
-  WOC, frequent turns -RD recs

## 2023-05-18 NOTE — Progress Notes (Signed)
 PT Cancellation Note  Patient Details Name: Louis Butler MRN: [id number] DOB: [birth year]   Cancelled Treatment:    Reason Eval/Treat Not Completed: Fatigue/lethargy limiting ability to participate. PT attempted to see pt after dialysis however pt declines due to fatigue. Pt reports he is worn out after dialysis and prefers to participate in PT on non-dialysis days. PT will attempt to see the patient on Tuesday/Thursday to avoid dialysis and possibly before dialysis on any MWF if able.   Rexie Catena 05/18/2023, 2:41 PM

## 2023-05-18 NOTE — Assessment & Plan Note (Signed)
 Depressed mood recently. Sertraline  25mg  started while inpatient. - Continue Sertraline  25 mg daily  HTN: Holding home medications in setting of orthostasis GERD: Protonix  40 mg daily T2DM: Very sensitive sliding scale insulin  Cancer of the skin, ear, & external auditory canal: Stable, continue scheduled Tylenol  Depression: Continue sertraline  25 mg daily   FEN/GI: Regular Diet w/ fluid restriction PPx: Heparin  Dispo:SNF pending clinical improvement . Barriers include hypotension.

## 2023-05-18 NOTE — Assessment & Plan Note (Signed)
 On HD MWF which will continue outpatient at [facility]. - Nephrology following, appreciate recommendations - Midodrine  10 mg with HD sessions - RFP daily

## 2023-05-18 NOTE — Progress Notes (Signed)
 PT Cancellation Note  Patient Details Name: ROBERTANTHONY SULLENDER MRN: [id number] DOB: 06-18-1955   Cancelled Treatment:    Reason Eval/Treat Not Completed: Patient at procedure or test/unavailable. PT attempted to see this patient early in the morning prior to dialysis however upon PT arrival pt had already left for treatment. PT will attempt to follow up after pt returns.   Rexie Catena 05/18/2023, 8:24 AM

## 2023-05-18 NOTE — Procedures (Signed)
 Patient seen on Hemodialysis. BP (!) 100/58   Pulse 73   Temp 97.8 F (36.6 C) (Axillary)   Resp 14   Ht 6\' 3"  (1.905 m)   Wt 86.3 kg   SpO2 98%   BMI 23.78 kg/m   QB 400, UF goal 500 (keeping even for flushes/vol bolus) Tolerating treatment without complaints at this time.   Clevester Dally MD [facility]. Office # [phone] Pager # [phone] 10:57 AM

## 2023-05-18 NOTE — TOC Progression Note (Signed)
 Transition of Care [facility]) - Progression Note    Patient Details  Name: Louis Butler MRN: [id number] Date of Birth: 27-Dec-1955  Transition of Care [facility]) CM/SW Contact  Novi Calia A [place], LCSW Phone Number: 05/18/2023, 1:54 PM  Clinical Narrative:     CSW was contacted by [facility] regarding pt's insurance authorization request. Pt was denied stating that pt has not completed his acute medical course and CSW can restart insurance authorization once medically stable.   Appeal information: [phone], fax-[phone].    TOC will continue to follow.  Expected Discharge Plan: Skilled Nursing Facility Barriers to Discharge: Continued Medical Work up  Expected Discharge Plan and Services   Discharge Planning Services: CM Consult Post Acute Care Choice: Home Health Living arrangements for the past 2 months: Single Family Home Expected Discharge Date: 05/11/23               DME Arranged: Hospital bed DME Agency: [employer] Date DME Agency Contacted: 05/04/23 Time DME Agency Contacted: [phone] Representative spoke with at DME Agency: Raechel Bulla HH Arranged: PT, Nurse's Aide HH Agency: [facility] Date [facility] Agency Contacted: 05/04/23 Time HH Agency Contacted: [phone] Representative spoke with at [facility] Agency: Amy   Social Determinants of Health (SDOH) Interventions SDOH Screenings   Food Insecurity: No Food Insecurity (05/08/2023)  Housing: Low Risk  (05/08/2023)  Transportation Needs: No Transportation Needs (05/08/2023)  Utilities: Not At Risk (05/08/2023)  Alcohol Screen: Low Risk  (12/01/2022)  Depression (PHQ2-9): Medium Risk (12/24/2022)  Financial Resource Strain: Low Risk  (12/01/2022)  Physical Activity: Insufficiently Active (12/01/2022)  Social Connections: Moderately Integrated (05/08/2023)  Stress: Stress Concern Present (12/01/2022)  Tobacco Use: Medium Risk (04/29/2023)  Health Literacy: Adequate Health Literacy (12/01/2022)    Readmission Risk  Interventions    05/04/2023    4:35 PM  Readmission Risk Prevention Plan  Transportation Screening Complete  HRI or Home Care Consult Complete  Social Work Consult for Recovery Care Planning/Counseling Complete  Palliative Care Screening Not Applicable  Medication Review Oceanographer) Referral to Pharmacy

## 2023-05-18 NOTE — Progress Notes (Addendum)
 Transport here for HD, A/O X4, denies any pain.

## 2023-05-18 NOTE — Progress Notes (Signed)
 Patient ID: Louis Butler, male   DOB: 1955-08-24, 68 y.o.   MRN: [id number] [facility] Progress Note   Assessment/ Plan:   1.  Deconditioning status post hospitalization: Awaiting reevaluation by physical therapy for discharge to skilled nursing facility; orthostatic blood pressures on treatment with midodrine  and now florinef .  2. ESRD: Continue hemodialysis on MWF schedule with HD today.  He has been accepted to the [facility] to resume dialysis after discharge.  Awaiting SNF approval. 3. Anemia: Low but relatively stable hemoglobin and hematocrit.  Holding off on ESA with recent head/neck cancer. 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 5. Nutrition: Continue protein/nutritional supplementation along with regular diet. 6. Hypotension: ACTH  stimulation test negative for adrenal insufficiency.  Midodrine  dose decreased and empirically started on florinef  with recurrent orthostatic drops. Droxidopa  cost prohibitive.   Subjective:   Transiently hypotensive earlier in HD, UF goal discontinued and treated with IV albumin    Objective:   BP (!) 100/58   Pulse 73   Temp 97.8 F (36.6 C) (Axillary)   Resp 14   Ht 6\' 3"  (1.905 m)   Wt 86.3 kg   SpO2 98%   BMI 23.78 kg/m   Physical Exam: Gen: Resting comfortably in dialysis, awakens to conversation CVS: Pulse regular rhythm, normal rate, S1 and S2 normal Resp: Clear to auscultation, no rales/rhonchi Abd: Soft, flat, nontender, bowel sounds normal Ext: No lower extremity edema.  Left upper arm AV fistula cannulated on HD  Labs: BMET Recent Labs  Lab 05/13/23 0800 05/14/23 0337 05/15/23 0405 05/16/23 0904 05/17/23 0822 05/18/23 0800  NA 133* 134* 132* 133* 134* 133*  K 4.1 4.2 4.1 4.1 3.8 4.2  CL 98 97* 95* 95* 96* 96*  CO2 23 29 26 28 25 23   GLUCOSE 128* 90 104* 109* 139* 119*  BUN 41* 26* 40* 29* 42* 67*   CREATININE 6.33* 4.22* 5.90* 4.53* 5.74* 6.83*  CALCIUM  8.0* 7.9* 8.0* 8.5* 8.6* 8.4*  PHOS 2.5 2.5 3.4 2.4* 2.7 3.3   CBC Recent Labs  Lab 05/15/23 0405 05/16/23 0904 05/17/23 0822 05/18/23 0800  WBC 4.8 5.0 4.7 5.5  HGB 8.2* 8.7* 8.4* 8.2*  HCT 25.8* 27.2* 26.2* 26.2*  MCV 96.6 97.1 97.8 98.5  PLT 199 198 195 216      Medications:     cholecalciferol   5,000 Units Oral Once per day on Monday Thursday   darbepoetin (ARANESP ) injection - DIALYSIS  150 mcg Subcutaneous Q Mon-1800   fludrocortisone   0.1 mg Oral Daily   gabapentin   300 mg Oral QHS   heparin  injection (subcutaneous)  5,000 Units Subcutaneous Q8H   midodrine   10 mg Oral TID WC   multivitamin  1 tablet Oral QHS   pantoprazole   40 mg Oral Daily   sertraline   25 mg Oral Daily   tamsulosin   0.4 mg Oral Daily   Clevester Dally, MD 05/18/2023, 10:55 AM

## 2023-05-18 NOTE — Assessment & Plan Note (Signed)
 Stable. - Follow-up with [facility] - Scheduled tylenol 1000mg  q6h for pain

## 2023-05-18 NOTE — Progress Notes (Signed)
 Received patient in bed to unit.  Alert and oriented.  Informed consent signed and in chart.   TX duration:3.75hr  Patient tolerated well.  Transported back to the room  Alert, without acute distress.  Hand-off given to patient's nurse.   Access used: LAVF Access issues: none  Total UF removed: 0net Medication(s) given: albumin , midodrine    05/18/23 1242  Vitals  Temp 98.6 F (37 C)  BP (!) 165/65  Pulse Rate 76  ECG Heart Rate 73  Resp 16  Oxygen Therapy  SpO2 99 %  During Treatment Monitoring  Blood Flow Rate (mL/min) 399 mL/min  Arterial Pressure (mmHg) -140.2 mmHg  Venous Pressure (mmHg) 242.61 mmHg  TMP (mmHg) -0.8 mmHg  Ultrafiltration Rate (mL/min) 319 mL/min  Dialysate Flow Rate (mL/min) 299 ml/min  Duration of HD Treatment -hour(s) 3.73 hour(s)  Cumulative Fluid Removed (mL) per Treatment  -4.93  HD Safety Checks Performed Yes  Intra-Hemodialysis Comments Tx completed  Dialysis Fluid Bolus Normal Saline  Bolus Amount (mL) 300 mL  Post Treatment  Dialyzer Clearance Heavily streaked  Hemodialysis Intake (mL) 500 mL  Liters Processed 90  Fluid Removed (mL) 500 mL  Tolerated HD Treatment Yes  AVG/AVF Arterial Site Held (minutes) 5 minutes  AVG/AVF Venous Site Held (minutes) 5 minutes  Fistula / Graft Left Upper arm  No placement date or time found.   Placed prior to admission: Yes  Orientation: Left  Access Location: Upper arm  Site Condition No complications  Fistula / Graft Assessment Present;Thrill;Bruit;Aneurysm present  Status Deaccessed      Gaile Jourdain, RN Kidney Dialysis Unit

## 2023-05-18 NOTE — TOC Progression Note (Signed)
 Transition of Care [facility]) - Progression Note    Patient Details  Name: Louis Butler MRN: [id number] Date of Birth: 07-21-1955  Transition of Care [facility]) CM/SW Contact  Katrinka Parr, [place] Phone Number: 05/18/2023, 10:03 AM  Clinical Narrative:     SNF auth went to peer to peer. CSW notified attending team of peer to peer details. CSW is informed that pt likely not stable for DC today as he is still hypotensive/orthostatic and being monitored for response to steroid. TOC will continue to follow; can restart auth when medically stable.   Expected Discharge Plan: Skilled Nursing Facility Barriers to Discharge: Continued Medical Work up  Expected Discharge Plan and Services   Discharge Planning Services: CM Consult Post Acute Care Choice: Home Health Living arrangements for the past 2 months: Single Family Home Expected Discharge Date: 05/11/23               DME Arranged: Hospital bed DME Agency: [employer] Date DME Agency Contacted: 05/04/23 Time DME Agency Contacted: [phone] Representative spoke with at DME Agency: Raechel Bulla HH Arranged: PT, Nurse's Aide HH Agency: [facility] Date [facility] Agency Contacted: 05/04/23 Time HH Agency Contacted: [phone] Representative spoke with at [facility] Agency: Amy   Social Determinants of Health (SDOH) Interventions SDOH Screenings   Food Insecurity: No Food Insecurity (05/08/2023)  Housing: Low Risk  (05/08/2023)  Transportation Needs: No Transportation Needs (05/08/2023)  Utilities: Not At Risk (05/08/2023)  Alcohol Screen: Low Risk  (12/01/2022)  Depression (PHQ2-9): Medium Risk (12/24/2022)  Financial Resource Strain: Low Risk  (12/01/2022)  Physical Activity: Insufficiently Active (12/01/2022)  Social Connections: Moderately Integrated (05/08/2023)  Stress: Stress Concern Present (12/01/2022)  Tobacco Use: Medium Risk (04/29/2023)  Health Literacy: Adequate Health Literacy (12/01/2022)    Readmission Risk Interventions    05/04/2023    4:35 PM   Readmission Risk Prevention Plan  Transportation Screening Complete  HRI or Home Care Consult Complete  Social Work Consult for Recovery Care Planning/Counseling Complete  Palliative Care Screening Not Applicable  Medication Review Oceanographer) Referral to Pharmacy

## 2023-05-18 NOTE — Assessment & Plan Note (Addendum)
 Stable. AM CBC 8.2  Transfusion threshold <7. - Monitor with CBC on dialysis days moving forward - Recommend OP follow-up

## 2023-05-18 NOTE — Progress Notes (Signed)
 Daily Progress Note Intern Pager: [phone]  Patient name: Louis Butler Medical record number: [id number] Date of birth: 11-Dec-1955 Age: 68 y.o. Gender: male  Primary Care Provider: Candee Cha, MD Consultants: Nephrology  Code Status: Full Code   Pt Overview and Major Events to Date:  1/22: Admitted to ICU for CRRT 1/26: FMTS took over care   Assessment and Plan:   Louis Butler  is a 68 yo M with past medical history of HTN, T2DM, ESRD on HD (previously discontinued), GERD, and Skin and ear cancer who was admitted for worsening ERSD requiring CRRT and resuming HD.  Recently he has been suffering from orthostatic hypotension which has been a barrier to discharge to SNF. He continues to get HD which seems to worsen his hypotension. .  Assessment & Plan ESRD (end stage renal disease) (HCC) On HD MWF which will continue outpatient at [facility]. - Nephrology following, appreciate recommendations - Midodrine  10 mg with HD sessions - RFP daily Orthostatic hypotension ACTH  stim test normal, no signs of adrenal dysfunction. BP Stable this AM, w/ MAPs ranging from 60-90s.  Continue with midodrine  and fludorcortisone added. Continue to monitor BP/orthostais. Encourage pt to be up and out of bed with assistance during PT sessions. - Midodrine  10mg  TID  - Fludrocortisone  0.1 mg daily added  - Target MAP >60  - Abdominal binders, compression stockings - Orthostatics   Anemia Stable. AM CBC 8.2  Transfusion threshold <7. 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 - Follow-up with [facility] - Scheduled tylenol  1000mg  q6h for pain Malnutrition of moderate degree RD following, appreciate recommendations. Ulcer of sacral region, stage 2 (HCC) - WOC, frequent turns - RD recs Depressed mood Depressed mood recently. Sertraline  25mg  started while inpatient. -  Continue Sertraline  25 mg daily  HTN: Holding home medications in setting of orthostasis GERD: Protonix  40 mg daily T2DM: Very sensitive sliding scale insulin  Cancer of the skin, ear, & external auditory canal: Stable, continue scheduled Tylenol  Depression: Continue sertraline  25 mg daily   FEN/GI: Regular Diet w/ fluid restriction PPx: Heparin  Dispo:SNF pending clinical improvement . Barriers include hypotension.     Subjective:  Patient evaluated during HD. Has issues with going from seated to standing position with ortho stasis. Denies any chest pain, shortness of breath.   Objective: Temp:  [97.9 F (36.6 C)-98.5 F (36.9 C)] 98.5 F (36.9 C) (02/10 0754) Pulse Rate:  [68-79] 70 (02/10 0600) Resp:  [14-18] 16 (02/10 0754) BP: (83-146)/(53-71) 143/67 (02/10 0400) SpO2:  [95 %-97 %] 95 % (02/10 0600) Weight:  [87 kg] 87 kg (02/10 0348) Physical Exam: General: NAD, good mood, currently in HD  Cardiovascular: RRR, NRMG Respiratory: Normal work of breathing Abdomen: Soft, NTTP, non-distended Extremities: moving all extremities, no edema  Laboratory: Most recent CBC Lab Results  Component Value Date   WBC 4.7 05/17/2023   HGB 8.4 (L) 05/17/2023   HCT 26.2 (L) 05/17/2023   MCV 97.8 05/17/2023   PLT 195 05/17/2023   Most recent BMP    Latest Ref Rng & Units 05/17/2023    8:22 AM  BMP  Glucose 70 - 99 mg/dL 147   BUN 8 - 23 mg/dL 42   Creatinine 8.29 - 1.24 mg/dL 5.62   Sodium 130 - 865 mmol/L 134   Potassium 3.5 - 5.1 mmol/L 3.8   Chloride 98 -  111 mmol/L 96   CO2 22 - 32 mmol/L 25   Calcium  8.9 - 10.3 mg/dL 8.6     Imaging/Diagnostic Tests: None last 24 hours   Brayton Calin, MD 05/18/2023, 8:24 AM  PGY-[id number], [facility] Health Family Medicine FPTS Intern pager: [phone], text pages welcome Secure chat group [facility] Teaching Service

## 2023-05-18 NOTE — Assessment & Plan Note (Addendum)
 ACTH  stim test normal, no signs of adrenal dysfunction. BP Stable this AM, w/ MAPs ranging from 60-90s.  Continue with midodrine  and fludorcortisone added. Continue to monitor BP/orthostais. Encourage pt to be up and out of bed with assistance during PT sessions. - Midodrine  10mg  TID  - Fludrocortisone  0.1 mg daily added  - Target MAP >60  - Abdominal binders, compression stockings - Orthostatics

## 2023-05-19 DIAGNOSIS — N179 Acute kidney failure, unspecified: Secondary | ICD-10-CM | POA: Diagnosis not present

## 2023-05-19 DIAGNOSIS — R531 Weakness: Secondary | ICD-10-CM | POA: Diagnosis not present

## 2023-05-19 DIAGNOSIS — E440 Moderate protein-calorie malnutrition: Secondary | ICD-10-CM

## 2023-05-19 DIAGNOSIS — N186 End stage renal disease: Secondary | ICD-10-CM | POA: Diagnosis not present

## 2023-05-19 LAB — RENAL FUNCTION PANEL
Albumin: 2.9 g/dL — ABNORMAL LOW (ref 3.5–5.0)
Anion gap: 15 (ref 5–15)
BUN: 35 mg/dL — ABNORMAL HIGH (ref 8–23)
CO2: 27 mmol/L (ref 22–32)
Calcium: 8.9 mg/dL (ref 8.9–10.3)
Chloride: 93 mmol/L — ABNORMAL LOW (ref 98–111)
Creatinine, Ser: 4.08 mg/dL — ABNORMAL HIGH (ref 0.61–1.24)
GFR, Estimated: 15 mL/min — ABNORMAL LOW (ref >60)
Glucose, Bld: 83 mg/dL (ref 70–99)
Phosphorus: 3 mg/dL (ref 2.5–4.6)
Potassium: 4.2 mmol/L (ref 3.5–5.1)
Sodium: 135 mmol/L (ref 135–145)

## 2023-05-19 LAB — GLUCOSE, CAPILLARY: Glucose-Capillary: 151 mg/dL — ABNORMAL HIGH (ref 70–99)

## 2023-05-19 LAB — CBC
HCT: 25.5 % — ABNORMAL LOW (ref 39.0–52.0)
Hemoglobin: 8.1 g/dL — ABNORMAL LOW (ref 13.0–17.0)
MCH: 31.5 pg (ref 26.0–34.0)
MCHC: 31.8 g/dL (ref 30.0–36.0)
MCV: 99.2 fL (ref 80.0–100.0)
Platelets: 192 10*3/uL (ref 150–400)
RBC: 2.57 MIL/uL — ABNORMAL LOW (ref 4.22–5.81)
RDW: 17.5 % — ABNORMAL HIGH (ref 11.5–15.5)
WBC: 6 10*3/uL (ref 4.0–10.5)
nRBC: 0 % (ref 0.0–0.2)

## 2023-05-19 MED ORDER — DROXIDOPA 100 MG PO CAPS: 100.0000 mg | ORAL_CAPSULE | Freq: Three times a day (TID) | ORAL | Status: DC

## 2023-05-19 MED ORDER — SERTRALINE HCL 50 MG PO TABS
50.0000 mg | ORAL_TABLET | Freq: Every day | ORAL | Status: DC
  Administered 2023-05-20 – 2023-06-11 (×21): 50 mg via ORAL
  Filled 2023-05-19 (×20): qty 1

## 2023-05-19 MED ORDER — FLUDROCORTISONE ACETATE 0.1 MG PO TABS
0.2000 mg | ORAL_TABLET | Freq: Every day | ORAL | Status: DC
  Administered 2023-05-19: 0.2 mg via ORAL
  Filled 2023-05-19 (×2): qty 2

## 2023-05-19 NOTE — Progress Notes (Signed)
Update provided to [facility] and to [facility] admissions. Will assist as needed.   Olivia Canter Renal Navigator [phone]

## 2023-05-19 NOTE — Assessment & Plan Note (Signed)
Stable. - Follow-up with [facility] - Scheduled tylenol 1000mg  q6h for pain

## 2023-05-19 NOTE — Progress Notes (Addendum)
Daily Progress Note Intern Pager: [phone]  Patient name: Louis Butler Medical record number: [id number] Date of birth: Apr 22, 1955 Age: 68 y.o. Gender: male  Primary Care Provider: Latrelle Dodrill, MD Consultants: Nephrology       Code Status: Full Code    Pt Overview and Major Events to Date:  1/22: Admitted to ICU for CRRT 1/26: FMTS took over care  Assessment and Plan:  Louis Butler  is a 68 yo M with past medical history of HTN, T2DM, ESRD on HD (previously discontinued), GERD, and Skin and ear cancer who was admitted for worsening ERSD requiring CRRT and resuming HD. Recently he has been suffering from orthostatic hypotension which has been a barrier to discharge to SNF. He continues to get HD which seems to worsen his hypotension.  Assessment & Plan Orthostatic hypotension ACTH stim test normal, no signs of adrenal dysfunction. BP Stable this AM, w/ MAPs ranging from 70-90s. Patient didn't receive fludrocortisone yesterday and orthostatsis persists. Will increase fludorcortisone and continue midodrine to assess for improvement. Continue to encourage sessions with PT/OT when scheduled.  - Midodrine 10mg  TID  - Midodrine 10 mg TID during dialysis  - Increased Fludrocortisone 0.2 mg daily  - Target MAP >60  - Abdominal binders, compression stockings - CTM vitals  - Orthostatics daily - PT/OT following   ESRD (end stage renal disease) (HCC) On HD MWF which will continue outpatient at [facility].  - Nephrology following, appreciate recommendations - RFP daily Anemia Stable. AM CBC 8.1  Transfusion threshold <7. 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 - Follow-up with [facility] - Scheduled tylenol 1000mg  q6h for pain Malnutrition of moderate degree RD following, appreciate recommendations. Ulcer of sacral region, stage 2 (HCC) - WOC, frequent  turns - RD recs Depressed mood Depressed mood recently. Sertraline 25 mg started while inpatient. - Increase Sertraline 50 mg daily  HTN: Holding home medications in setting of orthostasis GERD: Protonix 40 mg daily T2DM: Very sensitive sliding scale insulin Cancer of the skin, ear, & external auditory canal: Stable, continue scheduled Tylenol   FEN/GI: Regular Diet w/ fluid restriction PPx: Heparin Dispo:SNF pending clinical improvement . Barriers include hypotension.    Subjective:  Patient tolerated dialysis yesterday. Orthostasis persists. Wife bedside for support. Encouraged to work with PT while admitted.   Objective: Temp:  [97.8 F (36.6 C)-98.6 F (37 C)] 98.2 F (36.8 C) (02/11 0618) Pulse Rate:  [63-81] 63 (02/11 0618) Resp:  [14-18] 18 (02/11 0618) BP: (83-166)/(49-67) 132/62 (02/11 0618) SpO2:  [93 %-100 %] 98 % (02/11 0618) Weight:  [86.3 kg] 86.3 kg (02/11 0500) Physical Exam: General: NAD, depressed, flat affect, some emotional reactivity Cardiovascular: RRR, NRMG Respiratory: Normal work of breathing Abdomen: Soft, NTTP, non-distended Extremities: moving all extremities, no edema  Laboratory: Most recent CBC Lab Results  Component Value Date   WBC 6.0 05/19/2023   HGB 8.1 (L) 05/19/2023   HCT 25.5 (L) 05/19/2023   MCV 99.2 05/19/2023   PLT 192 05/19/2023   Most recent BMP    Latest Ref Rng & Units 05/19/2023    3:30 AM  BMP  Glucose 70 - 99 mg/dL 83   BUN 8 - 23 mg/dL 35   Creatinine 5.62 - 1.24 mg/dL 1.30   Sodium 865 - 784 mmol/L 135   Potassium 3.5 - 5.1 mmol/L 4.2   Chloride  98 - 111 mmol/L 93   CO2 22 - 32 mmol/L 27   Calcium 8.9 - 10.3 mg/dL 8.9      Imaging/Diagnostic Tests: No new imaging in last 24 hours   Peterson Ao, MD 05/19/2023, 7:48 AM  PGY-1, [facility] Health Family Medicine FPTS Intern pager: [phone], text pages welcome Secure chat group [facility] Teaching Service

## 2023-05-19 NOTE — TOC Progression Note (Signed)
Transition of Care [facility]) - Progression Note    Patient Details  Name: Louis Butler MRN: [id number] Date of Birth: 1955/09/27  Transition of Care [facility]) CM/SW Contact  Louis Butler, Louis Coria, RN Phone Number: 05/19/2023, 2:01 PM  Clinical Narrative:     CM consulted for LTACH. CM spoke with both and wife, Louis Butler at bedside. [facility]ation given [facility] and [facility] to [facility]. CM sent referral to [facility].  CM will continue to follow.          Expected Discharge Plan: Skilled Nursing Facility Barriers to Discharge: Continued Medical Work up  Expected Discharge Plan and Services   Discharge Planning Services: CM Consult Post Acute Care Choice: Home Health Living arrangements for the past 2 months: Single Family Home Expected Discharge Date: 05/11/23               DME Arranged: Hospital bed DME Agency: [employer] Date DME Agency Contacted: 05/04/23 Time DME Agency Contacted: [phone] Representative spoke with at DME Agency: Louis Butler HH Arranged: PT, Nurse's Aide HH Agency: [facility] Date [facility] Agency Contacted: 05/04/23 Time HH Agency Contacted: 1636 Representative spoke with at [facility] Agency: Louis Butler   Social Determinants of Health (SDOH) Interventions SDOH Screenings   Food Insecurity: No Food Insecurity (05/08/2023)  Housing: Low Risk  (05/08/2023)  Transportation Needs: No Transportation Needs (05/08/2023)  Utilities: Not At Risk (05/08/2023)  Alcohol Screen: Low Risk  (12/01/2022)  Depression (PHQ2-9): Medium Risk (12/24/2022)  Financial Resource Strain: Low Risk  (12/01/2022)  Physical Activity: Insufficiently Active (12/01/2022)  Social Connections: Moderately Integrated (05/08/2023)  Stress: Stress Concern Present (12/01/2022)  Tobacco Use: Medium Risk (04/29/2023)  Health Literacy: Adequate Health Literacy (12/01/2022)    Readmission Risk Interventions    05/04/2023    4:35 PM  Readmission Risk Prevention Plan  Transportation Screening  Complete  HRI or Home Care Consult Complete  Social Work Consult for Recovery Care Planning/Counseling Complete  Palliative Care Screening Not Applicable  Medication Review Oceanographer) Referral to Pharmacy

## 2023-05-19 NOTE — Plan of Care (Signed)
Problem: Nutritional: Goal: Maintenance of adequate nutrition will improve Outcome: Progressing   Problem: Education: Goal: Knowledge of General Education information will improve Description: Including pain rating scale, medication(s)/side effects and non-pharmacologic comfort measures Outcome: Progressing

## 2023-05-19 NOTE — Assessment & Plan Note (Signed)
RD following, appreciate recommendations.

## 2023-05-19 NOTE — Assessment & Plan Note (Signed)
On HD MWF which will continue outpatient at [facility].  - Nephrology following, appreciate recommendations - RFP daily

## 2023-05-19 NOTE — Progress Notes (Signed)
Physical Therapy Treatment Patient Details Name: Louis Butler MRN: [id number] DOB: [birth year] Today's Date: 05/19/2023   History of Present Illness 68 y.o. male presented 1/22 with N/V/abd pain and was found to have marked hyperkalemia and renal failure. CRRT 1/23-1/24. 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 Ricard Dillon), DM, HTN, recent squamous cell ca H&N (surgery, XRT 2024), anemia.    PT Comments  Continuing work on functional mobility and activity tolerance;  session focused on further assessment of BP response to upright activity -- notable BP drop in standing (to 70/57 at 3 min standing); his symptoms of presyncope are feeling "different" in his head, and then onset of headache; Care team notified of orthostatic vitals;   My understanding is that SNF would like pt to show more medical stability, with decr BP drop in response to standing; Will try Ace wraps next session;   [facility] planning   If plan is discharge home, recommend the following: A little help with walking and/or transfers;A little help with bathing/dressing/bathroom;Assistance with cooking/housework;Assist for transportation;Help with stairs or ramp for entrance   Can travel by private vehicle     Yes (consider leaning set back)  Equipment Recommendations  Other (comment) (May consider rollator RW)    Recommendations for Other Services       Precautions / Restrictions Precautions Precautions: Fall Precaution/Restrictions Comments: Monitor BP. Orthostatic - has abd binder     Mobility  Bed Mobility Overal bed mobility: Needs Assistance Bed Mobility: Supine to Sit     Supine to sit: Supervision     General bed mobility comments: Supervision for safety, extra time, no physical assist needed    Transfers Overall transfer level: Needs assistance Equipment used: Rolling walker (2 wheels) Transfers: Sit to/from Stand Sit to Stand: Contact guard assist   Step pivot  transfers: Contact guard assist       General transfer comment: Good rise; tolerated standing slightly longer than last session, with syncopal symptoms coming on about 2-3 mintues after standing    Ambulation/Gait               General Gait Details: Held gait today after BP drop in standing   Stairs             Wheelchair Mobility     Tilt Bed    Modified Rankin (Stroke Patients Only)       Balance     Sitting balance-Leahy Scale: Good       Standing balance-Leahy Scale: Fair Standing balance comment: More stable with assistive device.                            Communication Communication Communication: Impaired Factors Affecting Communication: Hearing impaired  Cognition Arousal: Alert Behavior During Therapy: WFL for tasks assessed/performed   PT - Cognitive impairments: History of cognitive impairments                       PT - Cognition Comments: Seems to be at baseline Following commands: Intact      Cueing    Exercises      General Comments  See other PT note for orthostatics; and/or See vitals flow sheet for details      Pertinent Vitals/Pain Pain Assessment Pain Assessment: No/denies pain Pain Intervention(s): Monitored during session    Home Living  Prior Function            PT Goals (current goals can now be found in the care plan section) Acute Rehab PT Goals Patient Stated Goal: Get well. PT Goal Formulation: With patient Time For Goal Achievement: 05/22/23 Potential to Achieve Goals: Fair Progress towards PT goals: Progressing toward goals    Frequency    Min 1X/week      PT Plan      Co-evaluation              AM-PAC PT "6 Clicks" Mobility   Outcome Measure  Help needed turning from your back to your side while in a flat bed without using bedrails?: None Help needed moving from lying on your back to sitting on the side of a flat bed  without using bedrails?: None Help needed moving to and from a bed to a chair (including a wheelchair)?: A Little Help needed standing up from a chair using your arms (e.g., wheelchair or bedside chair)?: A Little Help needed to walk in hospital room?: A Lot Help needed climbing 3-5 steps with a railing? : Total 6 Click Score: 17    End of Session Equipment Utilized During Treatment: Gait belt Activity Tolerance: Treatment limited secondary to medical complications (Comment) (Drop in BP.) Patient left: in chair;with call bell/phone within reach;with chair alarm set;with family/visitor present Nurse Communication: Mobility status (BP orthostatics) PT Visit Diagnosis: Unsteadiness on feet (R26.81);Muscle weakness (generalized) (M62.81);Difficulty in walking, not elsewhere classified (R26.2);Dizziness and giddiness (R42)     Time: 1610-9604 PT Time Calculation (min) (ACUTE ONLY): 40 min  Charges:    $Therapeutic Activity: 38-52 mins PT General Charges $$ ACUTE PT VISIT: 1 Visit                     Van Clines, PT  Acute Rehabilitation Services Office [phone] Secure Chat welcomed    [name] 05/19/2023, 1:19 PM

## 2023-05-19 NOTE — TOC Progression Note (Signed)
Transition of Care [facility]) - Progression Note    Patient Details  Name: Louis Butler MRN: [id number] Date of Birth: [birth year]  Transition of Care [facility]) CM/SW Contact  Carley Hammed, LCSW Phone Number: 05/19/2023, 1:41 PM  Clinical Narrative:     CSW advised that pt is still undergoing medical treatment and not medically ready for authorization to be restarted. TOC will continue to follow.   Expected Discharge Plan: Skilled Nursing Facility Barriers to Discharge: Continued Medical Work up  Expected Discharge Plan and Services   Discharge Planning Services: CM Consult Post Acute Care Choice: Home Health Living arrangements for the past 2 months: Single Family Home Expected Discharge Date: 05/11/23               DME Arranged: Hospital bed DME Agency: [employer] Date DME Agency Contacted: 05/04/23 Time DME Agency Contacted: [phone] Representative spoke with at DME Agency: Earna Coder HH Arranged: PT, Nurse's Aide HH Agency: [facility] Date [facility] Agency Contacted: 05/04/23 Time HH Agency Contacted: 1636 Representative spoke with at [facility] Agency: Amy   Social Determinants of Health (SDOH) Interventions SDOH Screenings   Food Insecurity: No Food Insecurity (05/08/2023)  Housing: Low Risk  (05/08/2023)  Transportation Needs: No Transportation Needs (05/08/2023)  Utilities: Not At Risk (05/08/2023)  Alcohol Screen: Low Risk  (12/01/2022)  Depression (PHQ2-9): Medium Risk (12/24/2022)  Financial Resource Strain: Low Risk  (12/01/2022)  Physical Activity: Insufficiently Active (12/01/2022)  Social Connections: Moderately Integrated (05/08/2023)  Stress: Stress Concern Present (12/01/2022)  Tobacco Use: Medium Risk (04/29/2023)  Health Literacy: Adequate Health Literacy (12/01/2022)    Readmission Risk Interventions    05/04/2023    4:35 PM  Readmission Risk Prevention Plan  Transportation Screening Complete  HRI or Home Care Consult Complete  Social Work Consult for Recovery Care  Planning/Counseling Complete  Palliative Care Screening Not Applicable  Medication Review Oceanographer) Referral to Pharmacy

## 2023-05-19 NOTE — Assessment & Plan Note (Signed)
-  WOC, frequent turns -RD recs

## 2023-05-19 NOTE — Assessment & Plan Note (Signed)
Stable. AM CBC 8.1  Transfusion threshold <7. - Monitor with CBC on dialysis days moving forward - Recommend OP follow-up

## 2023-05-19 NOTE — Progress Notes (Signed)
Physical Therapy Note  (Full treatment note to follow)  Continuing work on functional mobility and activity tolerance;   Session focused on activity tolerance, with specific eye on orthostatic BPs; numbers as follows:    05/19/23 1100  Vital Signs  Patient Position (if appropriate) Orthostatic Vitals  Orthostatic Lying   BP- Lying 134/62 (MAP 82)  Pulse- Lying 79  Orthostatic Sitting  BP- Sitting 121/69 (MAP 84)  Pulse- Sitting 97  Orthostatic Standing at 0 minutes  BP- Standing at 0 minutes (!) 86/54 (MAP 65)  Pulse- Standing at 0 minutes 103  Orthostatic Standing at 3 minutes  BP- Standing at 3 minutes (!) 70/57 (MAP 62)  Pulse- Standing at 3 minutes 110    For DC planning, we have been looking to SNF for rehab -- at this point, given his BP drops with upright activity, I believe it is worth considering LTACH;   Will continue to follow;   Van Clines, PT  Acute Rehabilitation Services Office [phone] Secure Chat welcomed

## 2023-05-19 NOTE — Care Management Important Message (Signed)
Important Message  Patient Details  Name: Louis Butler MRN: [id number] Date of Birth: Apr 10, 1955   Important Message Given:  Yes - Medicare IM     Dorena Bodo 05/19/2023, 2:40 PM

## 2023-05-19 NOTE — Assessment & Plan Note (Addendum)
Depressed mood recently. Sertraline 25 mg started while inpatient. - Increase Sertraline 50 mg daily  HTN: Holding home medications in setting of orthostasis GERD: Protonix 40 mg daily T2DM: Very sensitive sliding scale insulin Cancer of the skin, ear, & external auditory canal: Stable, continue scheduled Tylenol   FEN/GI: Regular Diet w/ fluid restriction PPx: Heparin Dispo:SNF pending clinical improvement . Barriers include hypotension.

## 2023-05-19 NOTE — Plan of Care (Signed)
  Problem: Coping: Goal: Ability to adjust to condition or change in health will improve Outcome: Progressing   Problem: Fluid Volume: Goal: Ability to maintain a balanced intake and output will improve Outcome: Progressing   Problem: Health Behavior/Discharge Planning: Goal: Ability to identify and utilize available resources and services will improve Outcome: Progressing Goal: Ability to manage health-related needs will improve Outcome: Progressing   Problem: Metabolic: Goal: Ability to maintain appropriate glucose levels will improve Outcome: Progressing   Problem: Nutritional: Goal: Maintenance of adequate nutrition will improve Outcome: Progressing Goal: Progress toward achieving an optimal weight will improve Outcome: Progressing   Problem: Skin Integrity: Goal: Risk for impaired skin integrity will decrease Outcome: Progressing   Problem: Education: Goal: Knowledge of General Education information will improve Description: Including pain rating scale, medication(s)/side effects and non-pharmacologic comfort measures Outcome: Progressing   Problem: Health Behavior/Discharge Planning: Goal: Ability to manage health-related needs will improve Outcome: Progressing   Problem: Clinical Measurements: Goal: Ability to maintain clinical measurements within normal limits will improve Outcome: Progressing Goal: Will remain free from infection Outcome: Progressing Goal: Diagnostic test results will improve Outcome: Progressing Goal: Respiratory complications will improve Outcome: Progressing Goal: Cardiovascular complication will be avoided Outcome: Progressing   Problem: Activity: Goal: Risk for activity intolerance will decrease Outcome: Progressing   Problem: Nutrition: Goal: Adequate nutrition will be maintained Outcome: Progressing   Problem: Coping: Goal: Level of anxiety will decrease Outcome: Progressing   Problem: Elimination: Goal: Will not  experience complications related to bowel motility Outcome: Progressing Goal: Will not experience complications related to urinary retention Outcome: Progressing   Problem: Pain Managment: Goal: General experience of comfort will improve and/or be controlled Outcome: Progressing   Problem: Safety: Goal: Ability to remain free from injury will improve Outcome: Progressing   Problem: Skin Integrity: Goal: Risk for impaired skin integrity will decrease Outcome: Progressing   Problem: Education: Goal: Knowledge of disease and its progression will improve Outcome: Progressing   Problem: Fluid Volume: Goal: Compliance with measures to maintain balanced fluid volume will improve Outcome: Progressing   Problem: Health Behavior/Discharge Planning: Goal: Ability to manage health-related needs will improve Outcome: Progressing   Problem: Nutritional: Goal: Ability to make healthy dietary choices will improve Outcome: Progressing   Problem: Clinical Measurements: Goal: Complications related to the disease process, condition or treatment will be avoided or minimized Outcome: Progressing

## 2023-05-19 NOTE — Progress Notes (Signed)
 Patient's wife requesting for CBG check,patient has hx of DM. On call MD text paged.

## 2023-05-19 NOTE — Assessment & Plan Note (Addendum)
ACTH stim test normal, no signs of adrenal dysfunction. BP Stable this AM, w/ MAPs ranging from 70-90s. Patient didn't receive fludrocortisone yesterday and orthostatsis persists. Will increase fludorcortisone and continue midodrine to assess for improvement. Continue to encourage sessions with PT/OT when scheduled.  - Midodrine 10mg  TID  - Midodrine 10 mg TID during dialysis  - Increased Fludrocortisone 0.2 mg daily  - Target MAP >60  - Abdominal binders, compression stockings - CTM vitals  - Orthostatics daily - PT/OT following

## 2023-05-19 NOTE — Progress Notes (Signed)
Patient ID: Louis Butler, male   DOB: [birth year], 67 y.o.   MRN: [id number] [facility] Progress Note   Assessment/ Plan:   1.  Deconditioning status post hospitalization: Awaiting reevaluation by physical therapy for discharge to skilled nursing facility; orthostatic blood pressures on treatment with midodrine and now florinef.  - HD tomorrow with midodrine/ florinef/ droxidopa 2. ESRD: Continue hemodialysis on MWF schedule with HD today.  He has been accepted to the [facility] kidney [facility] to resume dialysis after discharge.  Awaiting SNF approval. 3. Anemia: Low but relatively stable hemoglobin and hematocrit.  Holding off on ESA with recent head/neck cancer. 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 5. Nutrition: Continue protein/nutritional supplementation along with regular diet. 6. Hypotension: ACTH stimulation test negative for adrenal insufficiency.  Midodrine dose decreased and empirically started on florinef with recurrent orthostatic drops. Droxidopa cost prohibitive.   Subjective:    Continues to have orthostasis- will try to add droxidopa too.     Objective:   BP (!) 108/53   Pulse 74   Temp 98.2 F (36.8 C)   Resp 18   Ht 6\' 3"  (1.905 m)   Wt 86.3 kg   SpO2 98%   BMI 23.78 kg/m   Physical Exam: Gen: Resting comfortably in dialysis, awakens to conversation CVS: Pulse regular rhythm, normal rate, S1 and S2 normal Resp: Clear to auscultation, no rales/rhonchi Abd: Soft, flat, nontender, bowel sounds normal Ext: No lower extremity edema.  Left upper arm AV fistula cannulated on HD  Labs: BMET Recent Labs  Lab 05/13/23 0800 05/14/23 0337 05/15/23 0405 05/16/23 0904 05/17/23 0822 05/18/23 0800 05/19/23 0330  NA 133* 134* 132* 133* 134* 133* 135  K 4.1 4.2 4.1 4.1 3.8 4.2 4.2  CL 98 97* 95* 95* 96* 96* 93*  CO2 23 29 26 28 25 23 27   GLUCOSE 128* 90 104*  109* 139* 119* 83  BUN 41* 26* 40* 29* 42* 67* 35*  CREATININE 6.33* 4.22* 5.90* 4.53* 5.74* 6.83* 4.08*  CALCIUM 8.0* 7.9* 8.0* 8.5* 8.6* 8.4* 8.9  PHOS 2.5 2.5 3.4 2.4* 2.7 3.3 3.0   CBC Recent Labs  Lab 05/16/23 0904 05/17/23 0822 05/18/23 0800 05/19/23 0330  WBC 5.0 4.7 5.5 6.0  HGB 8.7* 8.4* 8.2* 8.1*  HCT 27.2* 26.2* 26.2* 25.5*  MCV 97.1 97.8 98.5 99.2  PLT 198 195 216 192      Medications:     cholecalciferol  5,000 Units Oral Once per day on Monday Thursday   darbepoetin (ARANESP) injection - DIALYSIS  150 mcg Subcutaneous Q Mon-1800   fludrocortisone  0.2 mg Oral Daily   gabapentin  300 mg Oral QHS   heparin injection (subcutaneous)  5,000 Units Subcutaneous Q8H   midodrine  10 mg Oral TID WC   multivitamin  1 tablet Oral QHS   pantoprazole  40 mg Oral Daily   sertraline  25 mg Oral Daily   tamsulosin  0.4 mg Oral Daily   Bufford Buttner MD 05/19/2023, 12:28 PM

## 2023-05-20 DIAGNOSIS — N186 End stage renal disease: Secondary | ICD-10-CM | POA: Diagnosis not present

## 2023-05-20 DIAGNOSIS — N189 Chronic kidney disease, unspecified: Secondary | ICD-10-CM | POA: Diagnosis not present

## 2023-05-20 DIAGNOSIS — N179 Acute kidney failure, unspecified: Secondary | ICD-10-CM | POA: Diagnosis not present

## 2023-05-20 DIAGNOSIS — I951 Orthostatic hypotension: Secondary | ICD-10-CM | POA: Diagnosis not present

## 2023-05-20 LAB — RENAL FUNCTION PANEL
Albumin: 3.1 g/dL — ABNORMAL LOW (ref 3.5–5.0)
Anion gap: 17 — ABNORMAL HIGH (ref 5–15)
BUN: 61 mg/dL — ABNORMAL HIGH (ref 8–23)
CO2: 23 mmol/L (ref 22–32)
Calcium: 8.7 mg/dL — ABNORMAL LOW (ref 8.9–10.3)
Chloride: 93 mmol/L — ABNORMAL LOW (ref 98–111)
Creatinine, Ser: 5.99 mg/dL — ABNORMAL HIGH (ref 0.61–1.24)
GFR, Estimated: 10 mL/min — ABNORMAL LOW (ref >60)
Glucose, Bld: 126 mg/dL — ABNORMAL HIGH (ref 70–99)
Phosphorus: 4.3 mg/dL (ref 2.5–4.6)
Potassium: 4.7 mmol/L (ref 3.5–5.1)
Sodium: 133 mmol/L — ABNORMAL LOW (ref 135–145)

## 2023-05-20 MED ORDER — MIDODRINE HCL 5 MG PO TABS
10.0000 mg | ORAL_TABLET | ORAL | Status: DC
  Administered 2023-05-22 – 2023-06-12 (×5): 10 mg via ORAL
  Filled 2023-05-20 (×3): qty 2

## 2023-05-20 MED ORDER — FLUDROCORTISONE ACETATE 0.1 MG PO TABS
0.2000 mg | ORAL_TABLET | Freq: Every day | ORAL | Status: DC
  Administered 2023-05-20 – 2023-06-09 (×20): 0.2 mg via ORAL
  Filled 2023-05-20 (×20): qty 2

## 2023-05-20 NOTE — Assessment & Plan Note (Signed)
Stable. AM CBC 8.1  Transfusion threshold <7. - Monitor with CBC on dialysis days moving forward - Recommend OP follow-up

## 2023-05-20 NOTE — Assessment & Plan Note (Signed)
Depressed mood recently. Sertraline 25 mg started while inpatient. - Continue Sertraline 50 mg daily  HTN: Holding home medications in setting of orthostasis GERD: Protonix 40 mg daily T2DM: A1c 6.8 Cancer of the skin, ear, & external auditory canal: Stable, continue scheduled Tylenol   FEN/GI: Regular Diet w/ fluid restriction PPx: Heparin Dispo:SNF pending clinical improvement . Barriers include hypotension.

## 2023-05-20 NOTE — Assessment & Plan Note (Addendum)
ACTH stim test normal, no signs of adrenal dysfunction. Bps stable this AM, w/ MAPs ranging from 70-90s. Patient didn't receive fludrocortisone yesterday and orthostatsis persists. Will continue fludrocortisone and amidodrine to assess for improvement. Continue to encourage sessions with PT/OT when scheduled.  - Midodrine 10mg  TID  - Midodrine 10 mg TID during dialysis  - Continue  Fludrocortisone 0.2 mg daily  - Target MAP >60  - Abdominal binders, compression stockings - CTM vitals  - Orthostatics daily - PT/OT following

## 2023-05-20 NOTE — Plan of Care (Signed)
  Problem: Coping: Goal: Ability to adjust to condition or change in health will improve Outcome: Progressing   Problem: Health Behavior/Discharge Planning: Goal: Ability to identify and utilize available resources and services will improve Outcome: Progressing   Problem: Nutritional: Goal: Maintenance of adequate nutrition will improve Outcome: Progressing   Problem: Education: Goal: Knowledge of General Education information will improve Description: Including pain rating scale, medication(s)/side effects and non-pharmacologic comfort measures Outcome: Progressing   Problem: Clinical Measurements: Goal: Respiratory complications will improve Outcome: Progressing   Problem: Activity: Goal: Risk for activity intolerance will decrease Outcome: Progressing

## 2023-05-20 NOTE — Assessment & Plan Note (Signed)
-  WOC, frequent turns -RD recs

## 2023-05-20 NOTE — Progress Notes (Signed)
   05/20/23 1302  Vitals  Temp 97.9 F (36.6 C)  Pulse Rate 69  Resp 15  BP (!) 110/58  SpO2 96 %  O2 Device Room Air  Post Treatment  Dialyzer Clearance Heavily streaked  Liters Processed 79.2  Fluid Removed (mL) 2000 mL  Tolerated HD Treatment Yes  AVG/AVF Arterial Site Held (minutes) 10 minutes  AVG/AVF Venous Site Held (minutes) 10 minutes   Received patient in bed to unit.  Alert and oriented.  Informed consent signed and in chart.   TX duration:3.75 HOURS  Patient tolerated well.  Transported back to the room  Alert, without acute distress.  Hand-off given to patient's nurse.   Access used: L AVF Access issues: None  Total UF removed: 2000 ml Medication(s) given: See MAR    Laqueta Due, RN [facility]

## 2023-05-20 NOTE — Plan of Care (Signed)
Problem: Nutritional: Goal: Maintenance of adequate nutrition will improve Outcome: Progressing   Problem: Education: Goal: Knowledge of General Education information will improve Description: Including pain rating scale, medication(s)/side effects and non-pharmacologic comfort measures Outcome: Progressing   Problem: Clinical Measurements: Goal: Respiratory complications will improve Outcome: Progressing

## 2023-05-20 NOTE — Assessment & Plan Note (Signed)
RD following, appreciate recommendations.

## 2023-05-20 NOTE — Assessment & Plan Note (Signed)
On HD MWF which will continue outpatient at [facility].  - Nephrology following, appreciate recommendations - RFP daily

## 2023-05-20 NOTE — Procedures (Signed)
Patient seen and examined on Hemodialysis. The procedure was supervised and I have made appropriate changes. BP 126/66   Pulse 63   Temp 97.7 F (36.5 C)   Resp 13   Ht 6\' 3"  (1.905 m)   Wt 84 kg   SpO2 95%   BMI 23.15 kg/m   QB 400 mL/ min via AVF, UF goal 2l  Tolerating treatment without complaints at this time.   Bufford Buttner MD [facility] [phone] 11:27 AM

## 2023-05-20 NOTE — Progress Notes (Signed)
Patient ID: PRABHAV FAULKENBERRY, male   DOB: 1955/05/29, 68 y.o.   MRN: [id number] [facility] Progress Note   Assessment/ Plan:   1.  Deconditioning status post hospitalization: Awaiting reevaluation by physical therapy for discharge to skilled nursing facility; orthostatic blood pressures on treatment with midodrine and now florinef.  - HD today, tolerating well with just midodrine 2. ESRD: Continue hemodialysis on MWF schedule with HD today.  He has been accepted to the [facility] kidney [facility] to resume dialysis after discharge.  Awaiting SNF approval. 3. Anemia: Low but relatively stable hemoglobin and hematocrit.  Holding off on ESA with recent head/neck cancer. 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 5. Nutrition: Continue protein/nutritional supplementation along with regular diet. 6. Hypotension: ACTH stimulation test negative for adrenal insufficiency.  Midodrine dose decreased and empirically started on florinef with recurrent orthostatic drops. Droxidopa cost prohibitive.   Subjective:    Seen on dialysis- apparently midodrine had been held on Monday beofre dialysis.  Midodrine given with better effect today- so will hold on droxidopa   Objective:   BP 126/66   Pulse 63   Temp 97.7 F (36.5 C)   Resp 13   Ht 6\' 3"  (1.905 m)   Wt 84 kg   SpO2 95%   BMI 23.15 kg/m   Physical Exam: Gen: Resting comfortably in dialysis, awakens to conversation CVS: Pulse regular rhythm, normal rate, S1 and S2 normal Resp: Clear to auscultation, no rales/rhonchi Abd: Soft, flat, nontender, bowel sounds normal Ext: No lower extremity edema.  Left upper arm AV fistula cannulated on HD  Labs: BMET Recent Labs  Lab 05/14/23 0337 05/15/23 0405 05/16/23 0904 05/17/23 0822 05/18/23 0800 05/19/23 0330 05/20/23 0806  NA 134* 132* 133* 134* 133* 135 133*  K 4.2 4.1 4.1 3.8 4.2 4.2 4.7  CL  97* 95* 95* 96* 96* 93* 93*  CO2 29 26 28 25 23 27 23   GLUCOSE 90 104* 109* 139* 119* 83 126*  BUN 26* 40* 29* 42* 67* 35* 61*  CREATININE 4.22* 5.90* 4.53* 5.74* 6.83* 4.08* 5.99*  CALCIUM 7.9* 8.0* 8.5* 8.6* 8.4* 8.9 8.7*  PHOS 2.5 3.4 2.4* 2.7 3.3 3.0 4.3   CBC Recent Labs  Lab 05/16/23 0904 05/17/23 0822 05/18/23 0800 05/19/23 0330  WBC 5.0 4.7 5.5 6.0  HGB 8.7* 8.4* 8.2* 8.1*  HCT 27.2* 26.2* 26.2* 25.5*  MCV 97.1 97.8 98.5 99.2  PLT 198 195 216 192      Medications:     cholecalciferol  5,000 Units Oral Once per day on Monday Thursday   darbepoetin (ARANESP) injection - DIALYSIS  150 mcg Subcutaneous Q Mon-1800   fludrocortisone  0.2 mg Oral Daily   gabapentin  300 mg Oral QHS   heparin injection (subcutaneous)  5,000 Units Subcutaneous Q8H   midodrine  10 mg Oral TID WC   midodrine  10 mg Oral Q M,W,F-HD   multivitamin  1 tablet Oral QHS   pantoprazole  40 mg Oral Daily   sertraline  50 mg Oral Daily   tamsulosin  0.4 mg Oral Daily   Bufford Buttner MD 05/20/2023, 11:25 AM

## 2023-05-20 NOTE — Progress Notes (Signed)
Daily Progress Note Intern Pager: [phone]  Patient name: Louis Butler Medical record number: [id number] Date of birth: [birth year] Age: 68 y.o. Gender: male  Primary Care Provider: Latrelle Dodrill, MD Consultants: Nephrology       Code Status: Full Code    Pt Overview and Major Events to Date:  1/22: Admitted to ICU for CRRT 1/26: FMTS took over care  Assessment and Plan:  Louis Butler  is a 68 yo M with past medical history of HTN, T2DM, ESRD on HD (previously discontinued), GERD, and Skin and ear cancer who was admitted for worsening ERSD requiring CRRT and resuming HD. Recently he has been suffering from orthostatic hypotension which has been a barrier to discharge to SNF. He continues to get HD which seems to worsen his hypotension.  Assessment & Plan Orthostatic hypotension ACTH stim test normal, no signs of adrenal dysfunction. Bps stable this AM, w/ MAPs ranging from 70-90s. Patient didn't receive fludrocortisone yesterday and orthostatsis persists. Will continue fludrocortisone and amidodrine to assess for improvement. Continue to encourage sessions with PT/OT when scheduled.  - Midodrine 10mg  TID  - Midodrine 10 mg TID during dialysis  - Continue  Fludrocortisone 0.2 mg daily  - Target MAP >60  - Abdominal binders, compression stockings - CTM vitals  - Orthostatics daily - PT/OT following   ESRD (end stage renal disease) (HCC) On HD MWF which will continue outpatient at [facility].  - Nephrology following, appreciate recommendations - RFP daily Anemia Stable. AM CBC 8.1  Transfusion threshold <7. 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 - Follow-up with [facility] - Scheduled tylenol 1000mg  q6h for pain Malnutrition of moderate degree RD following, appreciate recommendations. Ulcer of sacral region, stage 2 (HCC) - WOC, frequent turns - RD  recs Depressed mood Depressed mood recently. Sertraline 25 mg started while inpatient. - Continue Sertraline 50 mg daily  HTN: Holding home medications in setting of orthostasis GERD: Protonix 40 mg daily T2DM: A1c 6.8 Cancer of the skin, ear, & external auditory canal: Stable, continue scheduled Tylenol   FEN/GI: Regular Diet w/ fluid restriction PPx: Heparin Dispo:SNF pending clinical improvement . Barriers include hypotension.    Subjective:  Patient feels okay this morning. Denies chest pain or shortness of breath.    Objective: Temp:  [98 F (36.7 C)-98.6 F (37 C)] 98.3 F (36.8 C) (02/12 0557) Pulse Rate:  [72-80] 72 (02/12 0557) Resp:  [18] 18 (02/12 0557) BP: (108-155)/(53-68) 141/67 (02/12 0557) SpO2:  [95 %-98 %] 97 % (02/12 0557) Physical Exam: General: NAD  Cardiovascular: RRR, NRMG Respiratory: Normal work of breathing Abdomen: Soft, NTTP, non-distended Extremities: moving all extremities, no edema  Laboratory: Most recent CBC Lab Results  Component Value Date   WBC 6.0 05/19/2023   HGB 8.1 (L) 05/19/2023   HCT 25.5 (L) 05/19/2023   MCV 99.2 05/19/2023   PLT 192 05/19/2023   Most recent BMP    Latest Ref Rng & Units 05/19/2023    3:30 AM  BMP  Glucose 70 - 99 mg/dL 83   BUN 8 - 23 mg/dL 35   Creatinine 5.95 - 1.24 mg/dL 6.38   Sodium 756 - 433 mmol/L 135   Potassium 3.5 - 5.1 mmol/L 4.2   Chloride 98 - 111 mmol/L 93   CO2 22 - 32 mmol/L 27   Calcium 8.9 - 10.3 mg/dL  8.9      Imaging/Diagnostic Tests: None in last 24 hours   Peterson Ao, MD 05/20/2023, 8:34 AM  PGY-1, [facility] FPTS Intern pager: [phone], text pages welcome Secure chat group [facility] Teaching Service

## 2023-05-20 NOTE — Progress Notes (Signed)
OT Cancellation Note  Patient Details Name: Louis Butler MRN: [id number] DOB: 06/04/1955   Cancelled Treatment:    Reason Eval/Treat Not Completed: Patient at procedure or test/ unavailable (off unit at HD)  Carver Fila, OTD, OTR/L SecureChat Preferred Acute Rehab [phone]   Dalphine Handing 05/20/2023, 8:37 AM

## 2023-05-20 NOTE — Assessment & Plan Note (Signed)
Stable. - Follow-up with [facility] - Scheduled tylenol 1000mg  q6h for pain

## 2023-05-21 DIAGNOSIS — N186 End stage renal disease: Secondary | ICD-10-CM | POA: Diagnosis not present

## 2023-05-21 LAB — RENAL FUNCTION PANEL
Albumin: 3 g/dL — ABNORMAL LOW (ref 3.5–5.0)
Anion gap: 11 (ref 5–15)
BUN: 46 mg/dL — ABNORMAL HIGH (ref 8–23)
CO2: 29 mmol/L (ref 22–32)
Calcium: 8.8 mg/dL — ABNORMAL LOW (ref 8.9–10.3)
Chloride: 94 mmol/L — ABNORMAL LOW (ref 98–111)
Creatinine, Ser: 4.99 mg/dL — ABNORMAL HIGH (ref 0.61–1.24)
GFR, Estimated: 12 mL/min — ABNORMAL LOW (ref >60)
Glucose, Bld: 93 mg/dL (ref 70–99)
Phosphorus: 3.5 mg/dL (ref 2.5–4.6)
Potassium: 4.7 mmol/L (ref 3.5–5.1)
Sodium: 134 mmol/L — ABNORMAL LOW (ref 135–145)

## 2023-05-21 NOTE — Assessment & Plan Note (Addendum)
Stable. Transfusion threshold <7. - Monitor with CBC on dialysis days moving forward - Recommend OP follow-up

## 2023-05-21 NOTE — Progress Notes (Signed)
Patient ID: Louis Butler, male   DOB: October 17, 1955, 68 y.o.   MRN: [id number] [facility] Progress Note   Assessment/ Plan:   1.  Deconditioning status post hospitalization: Awaiting reevaluation by physical therapy for discharge to skilled nursing facility; orthostatic blood pressures on treatment with midodrine and now florinef.  - HD today, tolerating well with just midodrine 2. ESRD: Continue hemodialysis on MWF schedule with HD tomrorow.  He has been accepted to the [facility] kidney [facility] to resume dialysis after discharge.  Awaiting SNF approval. 3. Anemia: Low but relatively stable hemoglobin and hematocrit.  Holding off on ESA with recent head/neck cancer. 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 5. Nutrition: Continue protein/nutritional supplementation along with regular diet. 6. Hypotension: ACTH stimulation test negative for adrenal insufficiency.  Midodrine dose decreased and empirically started on florinef with recurrent orthostatic drops. Droxidopa cost prohibitive.   Subjective:    Did better with HD yesterday.  For HD tomorrow.     Objective:   BP 117/60 (BP Location: Right Arm)   Pulse 71   Temp 97.6 F (36.4 C) (Oral)   Resp 18   Ht 6\' 3"  (1.905 m)   Wt 84 kg   SpO2 96%   BMI 23.15 kg/m   Physical Exam: Gen: awake CVS: Pulse regular rhythm, normal rate, S1 and S2 normal Resp: Clear to auscultation, no rales/rhonchi Abd: Soft, flat, nontender, bowel sounds normal Ext: No lower extremity edema.  Left upper arm AV fistula cannulated on HD  Labs: BMET Recent Labs  Lab 05/15/23 0405 05/16/23 0904 05/17/23 0822 05/18/23 0800 05/19/23 0330 05/20/23 0806 05/21/23 0747  NA 132* 133* 134* 133* 135 133* 134*  K 4.1 4.1 3.8 4.2 4.2 4.7 4.7  CL 95* 95* 96* 96* 93* 93* 94*  CO2 26 28 25 23 27 23 29   GLUCOSE 104* 109* 139* 119* 83 126* 93  BUN 40* 29* 42*  67* 35* 61* 46*  CREATININE 5.90* 4.53* 5.74* 6.83* 4.08* 5.99* 4.99*  CALCIUM 8.0* 8.5* 8.6* 8.4* 8.9 8.7* 8.8*  PHOS 3.4 2.4* 2.7 3.3 3.0 4.3 3.5   CBC Recent Labs  Lab 05/16/23 0904 05/17/23 0822 05/18/23 0800 05/19/23 0330  WBC 5.0 4.7 5.5 6.0  HGB 8.7* 8.4* 8.2* 8.1*  HCT 27.2* 26.2* 26.2* 25.5*  MCV 97.1 97.8 98.5 99.2  PLT 198 195 216 192      Medications:     cholecalciferol  5,000 Units Oral Once per day on Monday Thursday   darbepoetin (ARANESP) injection - DIALYSIS  150 mcg Subcutaneous Q Mon-1800   fludrocortisone  0.2 mg Oral Daily   gabapentin  300 mg Oral QHS   heparin injection (subcutaneous)  5,000 Units Subcutaneous Q8H   midodrine  10 mg Oral TID WC   midodrine  10 mg Oral Q M,W,F-HD   multivitamin  1 tablet Oral QHS   pantoprazole  40 mg Oral Daily   sertraline  50 mg Oral Daily   tamsulosin  0.4 mg Oral Daily   Bufford Buttner MD 05/21/2023, 11:40 AM

## 2023-05-21 NOTE — Progress Notes (Signed)
Physical Therapy Treatment Patient Details Name: Louis Butler MRN: [id number] DOB: 08/17/1955 Today's Date: 05/21/2023   History of Present Illness 68 y.o. male presented 1/22 with N/V/abd pain and was found to have marked hyperkalemia and renal failure. CRRT 1/23-1/24. 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 Ricard Dillon), DM, HTN, recent squamous cell ca H&N (surgery, XRT 2024), anemia.    PT Comments  The pt was agreeable to session with focus on mobility progression and safety recommendations/education regarding mobility with known orthostatic hypotension. He was able to complete bed mobility with supervision, educated on use of rollator, and then able to complete sit-stand transfers without physical assistance. He continues to have significant drops in BP with changes in position, but today reports he was "half good" and had minimal sx. Therefore, with close chair follow, pt was allowed to walk in hallway (which he did with use of rollator for support) and BP continued to drop. [name] legs then wrapped with ACE bandages bilaterally for compression and there was some preservation of BP with addition of wraps. RN notified of vitals and asked for thigh-high TED hose. The pt typically ambulates without DME, and therefore will continue to benefit from skilled PT acutely to progress functional endurance, strength, and stability.   VITALS:  - supine in bed- BP: 129/69 (86); HR: 87bpm - sitting EOB - BP: 125/63 (82); HR: 93bpm - standing - BP: 89/56 (64); HR: 124bpm - standing after 3 min - BP: 82/51 (60); HR: 127bpm - standing after 75 ft walk - BP: 75/49 (57); HR: 134bpm - sitting EOB - BP: 112/69 (80); HR: 112bpm **Compression added** - standing - BP: 86/59 (65); HR: 119bpm - standing after 3 min - BP: 96/58 (68); HR: 135bpm - standing after 75 ft walk - BP: 81/55 (62); HR: 140bpm      If plan is discharge home, recommend the following: A little help with walking and/or transfers;A little help with  bathing/dressing/bathroom;Assistance with cooking/housework;Assist for transportation;Help with stairs or ramp for entrance   Can travel by private vehicle     Yes (consider leaning set back)  Equipment Recommendations  Rollator (4 wheels)    Recommendations for Other Services       Precautions / Restrictions Precautions Precautions: Fall Recall of Precautions/Restrictions: Intact Precaution/Restrictions Comments: Monitor BP. Orthostatic - has abd binder andACE wraps, RN ased for thigh high TED hose Restrictions Weight Bearing Restrictions Per Provider Order: No     Mobility  Bed Mobility Overal bed mobility: Needs Assistance Bed Mobility: Supine to Sit, Sit to Supine     Supine to sit: Supervision Sit to supine: Supervision   General bed mobility comments: Supervision for safety, extra time, no physical assist needed    Transfers Overall transfer level: Needs assistance Equipment used: Rollator (4 wheels) Transfers: Sit to/from Stand Sit to Stand: Supervision           General transfer comment: cues for hand placement, education on safety features (brakes) of rollator. BP does drop but pt stable    Ambulation/Gait Ambulation/Gait assistance: Contact guard assist, +2 safety/equipment (chair follow) Gait Distance (Feet): 75 Feet (x2) Assistive device: Rollator (4 wheels) Gait Pattern/deviations: Step-through pattern, Decreased stride length, Trunk flexed Gait velocity: decreased Gait velocity interpretation: <1.31 ft/sec, indicative of household ambulator   General Gait Details: pt completed 75 ft x2 withrollator, improved preservaiton of BP with legs wrapped. pt cued for self monitoring for need for seated rest     Balance Overall balance assessment: Needs assistance Sitting-balance support:  No upper extremity supported, Feet supported Sitting balance-Leahy Scale: Good     Standing balance support: No upper extremity supported Standing balance-Leahy  Scale: Fair Standing balance comment: More stable with assistive device.                            Communication Communication Communication: Impaired Factors Affecting Communication: Hearing impaired  Cognition Arousal: Alert Behavior During Therapy: WFL for tasks assessed/performed   PT - Cognitive impairments: History of cognitive impairments                       PT - Cognition Comments: Seems to be at baseline, slow to respond at times but could be due to [facility] Following commands: Intact      Cueing Cueing Techniques: Verbal cues, Gestural cues     General Comments General comments (skin integrity, edema, etc.): BP from 129/69 to 82/51 after 3 min stand, then to low of 75/49 after ambulation. bp remained soft but slightly improved after moblity with ACE wraps onLE (81/55)      Pertinent Vitals/Pain Pain Assessment Pain Assessment: No/denies pain Pain Intervention(s): Monitored during session     PT Goals (current goals can now be found in the care plan section) Acute Rehab PT Goals Patient Stated Goal: to be able to leave his house for "fun" activity PT Goal Formulation: With patient Time For Goal Achievement: 06/04/23 Potential to Achieve Goals: Fair Progress towards PT goals: Progressing toward goals    Frequency    Min 1X/week       AM-PAC PT "6 Clicks" Mobility   Outcome Measure  Help needed turning from your back to your side while in a flat bed without using bedrails?: None Help needed moving from lying on your back to sitting on the side of a flat bed without using bedrails?: None Help needed moving to and from a bed to a chair (including a wheelchair)?: A Little Help needed standing up from a chair using your arms (e.g., wheelchair or bedside chair)?: A Little Help needed to walk in hospital room?: A Lot Help needed climbing 3-5 steps with a railing? : Total 6 Click Score: 17    End of Session Equipment Utilized During  Treatment: Gait belt Activity Tolerance: Patient tolerated treatment well;Other (comment) (Drop in BP.) Patient left: with call bell/phone within reach;with family/visitor present;in bed Nurse Communication: Mobility status (BP orthostatics) PT Visit Diagnosis: Unsteadiness on feet (R26.81);Muscle weakness (generalized) (M62.81);Difficulty in walking, not elsewhere classified (R26.2);Dizziness and giddiness (R42)     Time: 1610-9604 PT Time Calculation (min) (ACUTE ONLY): 46 min  Charges:    $Therapeutic Exercise: 8-22 mins $Therapeutic Activity: 8-22 mins PT General Charges $$ ACUTE PT VISIT: 1 Visit                     Vickki Muff, PT, DPT   Acute Rehabilitation Department Office [phone] Secure Chat Communication Preferred   Ronnie Derby 05/21/2023, 3:46 PM

## 2023-05-21 NOTE — Progress Notes (Signed)
Occupational Therapy Treatment Patient Details Name: Louis Butler MRN: [id number] DOB: Sep 05, 1955 Today's Date: 05/21/2023   History of present illness 68 y.o. male presented 1/22 with N/V/abd pain and was found to have marked hyperkalemia and renal failure. CRRT 1/23-1/24. 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 Ricard Dillon), DM, HTN, recent squamous cell ca H&N (surgery, XRT 2024), anemia.   OT comments  Focus of session on educating pt and wife in monitoring for symptoms of orthostatic hypotension and having a plan where pt may safely sit and avoid a fall. Recommended rollator for ambulation, sitting to shower (owns a shower seat) and consider w/c down ramp to get to and from car and into dialysis center, should pt return home. Abdominal binder and LE compression appeared to be helpful with decreasing low BP (see PT note). RN to order thigh high compression hose and pt and wife are knowledgeable in donning and doffing abdominal binder.       If plan is discharge home, recommend the following:  A little help with walking and/or transfers;A little help with bathing/dressing/bathroom;Assistance with cooking/housework;Assist for transportation;Help with stairs or ramp for entrance   Equipment Recommendations  Hospital bed    Recommendations for Other Services      Precautions / Restrictions Precautions Precautions: Fall Precaution/Restrictions Comments: Monitor BP. Orthostatic - has abd binder andACE wraps, RN ased for thigh high TED hose Restrictions Weight Bearing Restrictions Per Provider Order: No       Mobility Bed Mobility Overal bed mobility: Needs Assistance Bed Mobility: Supine to Sit, Sit to Supine     Supine to sit: Supervision Sit to supine: Supervision   General bed mobility comments: Supervision for safety, extra time, no physical assist needed    Transfers Overall transfer level: Needs assistance Equipment used: Rollator (4 wheels) Transfers: Sit to/from Stand Sit to Stand:  Supervision                 Balance Overall balance assessment: Needs assistance   Sitting balance-Leahy Scale: Good     Standing balance support: No upper extremity supported Standing balance-Leahy Scale: Fair Standing balance comment: More stable with assistive device.                           ADL either performed or assessed with clinical judgement   ADL                   Upper Body Dressing : Minimal assistance;Sitting Upper Body Dressing Details (indicate cue type and reason): front opening gown and abd binder Lower Body Dressing: Total assistance;Bed level;Sitting/lateral leans Lower Body Dressing Details (indicate cue type and reason): LEs wrapped in Ace wraps             Functional mobility during ADLs: Contact guard assist;Rollator (4 wheels)      Extremity/Trunk Assessment              Vision       Perception     Praxis     Communication Communication Communication: Impaired Factors Affecting Communication: Hearing impaired   Cognition Arousal: Alert Behavior During Therapy: WFL for tasks assessed/performed               OT - Cognition Comments: cues to monitor symptoms of orthostasis, decreased awareness of deficits and safety                          Cueing  Exercises      Shoulder Instructions       General Comments     Pertinent Vitals/ Pain       Pain Assessment Pain Assessment: Faces Faces Pain Scale: Hurts little more Pain Location: chronic back pain Pain Descriptors / Indicators: Discomfort, Grimacing Pain Intervention(s): Monitored during session, Repositioned  Home Living                                          Prior Functioning/Environment              Frequency  Min 1X/week        Progress Toward Goals  OT Goals(current goals can now be found in the care plan section)  Progress towards OT goals: Progressing toward goals  Acute Rehab OT  Goals OT Goal Formulation: With patient Time For Goal Achievement: 06/04/23 Potential to Achieve Goals: Good  Plan      Co-evaluation                 AM-PAC OT "6 Clicks" Daily Activity     Outcome Measure   Help from another person eating meals?: None Help from another person taking care of personal grooming?: A Little Help from another person toileting, which includes using toliet, bedpan, or urinal?: A Little Help from another person bathing (including washing, rinsing, drying)?: A Little Help from another person to put on and taking off regular upper body clothing?: A Little Help from another person to put on and taking off regular lower body clothing?: A Little 6 Click Score: 19    End of Session Equipment Utilized During Treatment: Rollator (4 wheels);Gait belt  OT Visit Diagnosis: Unsteadiness on feet (R26.81);Muscle weakness (generalized) (M62.81)   Activity Tolerance Patient tolerated treatment well   Patient Left in bed;with call bell/phone within reach;with family/visitor present   Nurse Communication Mobility status;Other (comment) (ordered thigh high ted hose)        Time: 1610-9604 OT Time Calculation (min): 46 min  Charges: OT General Charges $OT Visit: 1 Visit OT Treatments $Self Care/Home Management : 8-22 mins  Berna Spare, OTR/L Acute Rehabilitation Services Office: [phone]   Evern Bio 05/21/2023, 3:57 PM

## 2023-05-21 NOTE — Assessment & Plan Note (Addendum)
On HD MWF which will continue outpatient at [facility]  HD yesterday 2L removed, no complications  - Nephrology following, appreciate recommendations - RFP daily

## 2023-05-21 NOTE — Assessment & Plan Note (Signed)
RD following, appreciate recommendations.

## 2023-05-21 NOTE — TOC Progression Note (Signed)
Transition of Care [facility]) - Progression Note    Patient Details  Name: Louis Butler MRN: [id number] Date of Birth: Aug 23, 1955  Transition of Care [facility]) CM/SW Contact  Tom-Johnson, Hershal Coria, RN Phone Number: 05/21/2023, 1:28 PM  Clinical Narrative:     Insurance denied Auth for [employer]. Plan is to appeal denial and if appeal is denied, plan is to go home with home health. MD aware.  TOC will continue to follow.          Expected Discharge Plan: Skilled Nursing Facility Barriers to Discharge: Continued Medical Work up  Expected Discharge Plan and Services   Discharge Planning Services: CM Consult Post Acute Care Choice: Home Health Living arrangements for the past 2 months: Single Family Home Expected Discharge Date: 05/11/23               DME Arranged: Hospital bed DME Agency: [employer] Date DME Agency Contacted: 05/04/23 Time DME Agency Contacted: [phone] Representative spoke with at DME Agency: Earna Coder HH Arranged: PT, Nurse's Aide HH Agency: [facility] Date [facility] Agency Contacted: 05/04/23 Time HH Agency Contacted: [phone] Representative spoke with at [facility] Agency: Amy   Social Determinants of Health (SDOH) Interventions SDOH Screenings   Food Insecurity: No Food Insecurity (05/08/2023)  Housing: Low Risk  (05/08/2023)  Transportation Needs: No Transportation Needs (05/08/2023)  Utilities: Not At Risk (05/08/2023)  Alcohol Screen: Low Risk  (12/01/2022)  Depression (PHQ2-9): Medium Risk (12/24/2022)  Financial Resource Strain: Low Risk  (12/01/2022)  Physical Activity: Insufficiently Active (12/01/2022)  Social Connections: Moderately Integrated (05/08/2023)  Stress: Stress Concern Present (12/01/2022)  Tobacco Use: Medium Risk (04/29/2023)  Health Literacy: Adequate Health Literacy (12/01/2022)    Readmission Risk Interventions    05/04/2023    4:35 PM  Readmission Risk Prevention Plan  Transportation Screening Complete  HRI or Home Care Consult Complete   Social Work Consult for Recovery Care Planning/Counseling Complete  Palliative Care Screening Not Applicable  Medication Review Oceanographer) Referral to Pharmacy

## 2023-05-21 NOTE — Assessment & Plan Note (Signed)
Depressed mood recently. Sertraline 25 mg started while inpatient. - Continue Sertraline 50 mg daily  HTN: Holding home medications in setting of orthostasis GERD: Protonix 40 mg daily T2DM: A1c 6.8 Cancer of the skin, ear, & external auditory canal: Stable, continue scheduled Tylenol   FEN/GI: Regular Diet w/ fluid restriction PPx: Heparin Dispo:SNF pending clinical improvement . Barriers include hypotension.

## 2023-05-21 NOTE — Progress Notes (Signed)
Daily Progress Note Intern Pager: [phone]  Patient name: Louis Butler Medical record number: [id number] Date of birth: 08-24-1955 Age: 68 y.o. Gender: male  Primary Care Provider: Latrelle Dodrill, MD Consultants: Nephrology       Code Status: Full Code    Pt Overview and Major Events to Date:  1/22: Admitted to ICU for CRRT 1/26: FMTS took over care   Assessment and Plan:   Louis Butler  is a 68 yo M with past medical history of HTN, T2DM, ESRD on HD (previously discontinued), GERD, and Skin and ear cancer who was admitted for worsening ERSD requiring CRRT and resuming HD. Recently he has been suffering from orthostatic hypotension which has been a barrier to discharge to SNF. He continues to get HD which seems to worsen his hypotension.  Assessment & Plan Orthostatic hypotension ACTH stim test normal, no signs of adrenal dysfunction. Bps stable this AM, w/ MAPs ranging from 70-80s. Will continue fludrocortisone and amidodrine to assess for improvement. Continue to encourage sessions with PT/OT when scheduled.  - Midodrine 10mg  TID  - Midodrine 10 mg TID during dialysis  - Continue  Fludrocortisone 0.2 mg daily  - Target MAP >60  - Abdominal binders, compression stockings - CTM vitals  - Orthostatics daily - PT/OT following   ESRD (end stage renal disease) (HCC) On HD MWF which will continue outpatient at [facility].  HD yesterday 2L removed, no complications  - Nephrology following, appreciate recommendations - RFP daily Anemia Stable. Transfusion threshold <7. 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 - Follow-up with [facility] - Scheduled tylenol 1000mg  q6h for pain Malnutrition of moderate degree RD following, appreciate recommendations. Ulcer of sacral region, stage 2 (HCC) - WOC, frequent turns - RD recs Depressed mood Depressed mood  recently. Sertraline 25 mg started while inpatient. - Continue Sertraline 50 mg daily  HTN: Holding home medications in setting of orthostasis GERD: Protonix 40 mg daily T2DM: A1c 6.8 Cancer of the skin, ear, & external auditory canal: Stable, continue scheduled Tylenol   FEN/GI: Regular Diet w/ fluid restriction PPx: Heparin Dispo:SNF pending clinical improvement . Barriers include hypotension.      Subjective:  Patient reports that he feels somewhat better today. Denies chest pain, shortness of breath. No issues with HD yesterday. Wife bedside for support.   Objective: Temp:  [97.7 F (36.5 C)-98.9 F (37.2 C)] 98.9 F (37.2 C) (02/12 2030) Pulse Rate:  [63-73] 69 (02/13 0433) Resp:  [13-20] 18 (02/12 2030) BP: (88-142)/(52-70) 131/62 (02/13 0433) SpO2:  [92 %-98 %] 96 % (02/13 0433) Weight:  [84 kg] 84 kg (02/12 0834) Physical Exam: General: NAD, wife bedside  Cardiovascular: RRR, NRMG Respiratory: Normal work of breathing Abdomen: Soft, NTTP, non-distended Extremities: moving all extremities, no edema  Laboratory: Most recent CBC Lab Results  Component Value Date   WBC 6.0 05/19/2023   HGB 8.1 (L) 05/19/2023   HCT 25.5 (L) 05/19/2023   MCV 99.2 05/19/2023   PLT 192 05/19/2023   Most recent BMP    Latest Ref Rng & Units 05/20/2023    8:06 AM  BMP  Glucose 70 - 99 mg/dL 295   BUN 8 - 23 mg/dL 61   Creatinine 6.21 - 1.24 mg/dL 3.08   Sodium 657 - 846 mmol/L 133   Potassium 3.5 - 5.1 mmol/L 4.7   Chloride 98 -  111 mmol/L 93   CO2 22 - 32 mmol/L 23   Calcium 8.9 - 10.3 mg/dL 8.7      Imaging/Diagnostic Tests: None in last 24 hours   Peterson Ao, MD 05/21/2023, 8:25 AM  PGY-1, [facility] Health Family Medicine FPTS Intern pager: [phone], text pages welcome Secure chat group [facility] Teaching Service

## 2023-05-21 NOTE — Progress Notes (Signed)
Nutrition Follow-up  DOCUMENTATION CODES:   Non-severe (moderate) malnutrition in context of chronic illness  INTERVENTION:  Encourage adequate PO intake; double protein portions with meals Regular diet to liberalize calorie/protein intake Discontinue Magic Cup as patient reports loose stools Renal MVI with minerals daily  NUTRITION DIAGNOSIS:  Moderate Malnutrition related to chronic illness (renal disease) as evidenced by moderate fat depletion, mild muscle depletion, moderate muscle depletion, percent weight loss (16% weight loss within 6 months). - remains applicable  GOAL:  Patient will meet greater than or equal to 90% of their needs - progressing  MONITOR:  PO intake, Supplement acceptance, Skin  REASON FOR ASSESSMENT:  Consult Assessment of nutrition requirement/status, Diet education  ASSESSMENT:   68 yo male admitted with acute on chronic renal failure. PMH includes ESRD-stopped HD 06/2020, DM-2, GERD, HTN.  Met with patient and his wife at bedside. Appetite has improved, per wife's report. Most recently documented intake over one week ago showed average intake of 97% x8 meals. Wife and patient endorse he is consuming most all of his food, but would prefer liberalization from carb modified to better meet his needs. He does not prefer Magic Cup as it induces diarrhea. Same occurs with supplements (i.e Boost, Ensure, etc.).   Given appropriate blood sugar trend prior to 2/12 diet change to carb modified and A1c <7.0% concomitant with presence of wounds, recommend liberalizing diet and D/C Magic Cup to encourage intake. He remains on double portion proteins.  Admit Weight: 76.3kg Current Weight: 84kg  Will discharge to rehab and re-initiate OHD at [facility] in [place], [place]. HD on 2/12 with UF of 2L. Showed UOP of yesterday. No edema noted on exam.    Intake/Output Summary (Last 24 hours) at 05/21/2023 1147 Last data filed at 05/21/2023 9147 Gross per 24 hour  Intake  180 ml  Output 2375 ml  Net -2195 ml     Net IO Since Admission: -14,074.87 mL [05/21/23 1146]   Meds: cholecalciferol, darbepoetin alfa, renal MVI, pantoprazole  Labs: Na+ 135>133>134 (L) K+ 4.7 (wdl) Mg 1.7 (wdl) BUN 35>61>46 (H) Crt 8.29>5.62>1.30 (H) CBGs 93-126 over 48 hours A1c 6.8 (04/2023)  Diet Order:   Diet Order             Diet Carb Modified Fluid consistency: Thin; Room service appropriate? Yes; Fluid restriction: 1200 mL Fluid  Diet effective now             EDUCATION NEEDS:   Education needs have been addressed  Skin:  Skin Assessment: Skin Integrity Issues: Skin Integrity Issues:: Stage II Stage II: R buttocks  Last BM:  2/12  Height:  Ht Readings from Last 1 Encounters:  04/29/23 6\' 3"  (1.905 m)   Weight:  Wt Readings from Last 1 Encounters:  05/20/23 84 kg    Ideal Body Weight:  89.1 kg  BMI:  Body mass index is 23.15 kg/m.  Estimated Nutritional Needs:   Kcal:  2200-2400  Protein:  110-120 gm  Fluid:  1 L + UOP  Myrtie Cruise MS, RD, LDN Registered Dietitian Clinical Nutrition RD Inpatient Contact Info in Amion

## 2023-05-21 NOTE — Plan of Care (Addendum)
Contacted you [employer] to complete peer to peer regarding submission of LTACH authorization.  Representative stated the medical director was unavailable and would return the call when able.  Confirmed that the deadline is extended based on their response time.  12:55PM: Spoke with [employer] geologist (refused to provide name per company policy) regarding peer to peer.  She specified patient does not meet LTACH criteria and would be unable to approve authorization.  Provided no additional criteria to qualify for [facility] services and unwilling to consider patient circumstance.  She stated patient can appeal if desired.  Elberta Fortis, MD 05/21/2023, 12:46 PM PGY-2, [facility] Family Medicine Service pager [phone]

## 2023-05-21 NOTE — Assessment & Plan Note (Addendum)
ACTH stim test normal, no signs of adrenal dysfunction. Bps stable this AM, w/ MAPs ranging from 70-80s. Will continue fludrocortisone and amidodrine to assess for improvement. Continue to encourage sessions with PT/OT when scheduled.  - Midodrine 10mg  TID  - Midodrine 10 mg TID during dialysis  - Continue  Fludrocortisone 0.2 mg daily  - Target MAP >60  - Abdominal binders, compression stockings - CTM vitals  - Orthostatics daily - PT/OT following

## 2023-05-21 NOTE — Assessment & Plan Note (Signed)
-  WOC, frequent turns -RD recs

## 2023-05-21 NOTE — Assessment & Plan Note (Signed)
Stable. - Follow-up with [facility] - Scheduled tylenol 1000mg  q6h for pain

## 2023-05-22 DIAGNOSIS — N186 End stage renal disease: Secondary | ICD-10-CM | POA: Diagnosis not present

## 2023-05-22 LAB — RENAL FUNCTION PANEL
Albumin: 3.1 g/dL — ABNORMAL LOW (ref 3.5–5.0)
Anion gap: 11 (ref 5–15)
BUN: 66 mg/dL — ABNORMAL HIGH (ref 8–23)
CO2: 27 mmol/L (ref 22–32)
Calcium: 8.4 mg/dL — ABNORMAL LOW (ref 8.9–10.3)
Chloride: 95 mmol/L — ABNORMAL LOW (ref 98–111)
Creatinine, Ser: 6.16 mg/dL — ABNORMAL HIGH (ref 0.61–1.24)
GFR, Estimated: 9 mL/min — ABNORMAL LOW (ref >60)
Glucose, Bld: 100 mg/dL — ABNORMAL HIGH (ref 70–99)
Phosphorus: 4.4 mg/dL (ref 2.5–4.6)
Potassium: 4.5 mmol/L (ref 3.5–5.1)
Sodium: 133 mmol/L — ABNORMAL LOW (ref 135–145)

## 2023-05-22 LAB — CBC
HCT: 27.9 % — ABNORMAL LOW (ref 39.0–52.0)
Hemoglobin: 9.1 g/dL — ABNORMAL LOW (ref 13.0–17.0)
MCH: 32.6 pg (ref 26.0–34.0)
MCHC: 32.6 g/dL (ref 30.0–36.0)
MCV: 100 fL (ref 80.0–100.0)
Platelets: 195 10*3/uL (ref 150–400)
RBC: 2.79 MIL/uL — ABNORMAL LOW (ref 4.22–5.81)
RDW: 18.9 % — ABNORMAL HIGH (ref 11.5–15.5)
WBC: 6.5 10*3/uL (ref 4.0–10.5)
nRBC: 0 % (ref 0.0–0.2)

## 2023-05-22 NOTE — Assessment & Plan Note (Signed)
-  WOC, frequent turns -RD recs

## 2023-05-22 NOTE — Assessment & Plan Note (Signed)
On HD MWF which will continue outpatient at [facility], no complications  - Nephrology following, appreciate recommendations - RFP daily

## 2023-05-22 NOTE — Plan of Care (Signed)
  Problem: Coping: Goal: Ability to adjust to condition or change in health will improve Outcome: Progressing   Problem: Fluid Volume: Goal: Ability to maintain a balanced intake and output will improve Outcome: Progressing   Problem: Health Behavior/Discharge Planning: Goal: Ability to identify and utilize available resources and services will improve Outcome: Progressing Goal: Ability to manage health-related needs will improve Outcome: Progressing   Problem: Metabolic: Goal: Ability to maintain appropriate glucose levels will improve Outcome: Progressing   Problem: Nutritional: Goal: Maintenance of adequate nutrition will improve Outcome: Progressing Goal: Progress toward achieving an optimal weight will improve Outcome: Progressing   Problem: Skin Integrity: Goal: Risk for impaired skin integrity will decrease Outcome: Progressing   Problem: Education: Goal: Knowledge of General Education information will improve Description: Including pain rating scale, medication(s)/side effects and non-pharmacologic comfort measures Outcome: Progressing   Problem: Health Behavior/Discharge Planning: Goal: Ability to manage health-related needs will improve Outcome: Progressing   Problem: Clinical Measurements: Goal: Ability to maintain clinical measurements within normal limits will improve Outcome: Progressing Goal: Will remain free from infection Outcome: Progressing Goal: Diagnostic test results will improve Outcome: Progressing Goal: Respiratory complications will improve Outcome: Progressing Goal: Cardiovascular complication will be avoided Outcome: Progressing   Problem: Activity: Goal: Risk for activity intolerance will decrease Outcome: Progressing   Problem: Nutrition: Goal: Adequate nutrition will be maintained Outcome: Progressing   Problem: Coping: Goal: Level of anxiety will decrease Outcome: Progressing   Problem: Elimination: Goal: Will not  experience complications related to bowel motility Outcome: Progressing Goal: Will not experience complications related to urinary retention Outcome: Progressing   Problem: Pain Managment: Goal: General experience of comfort will improve and/or be controlled Outcome: Progressing   Problem: Safety: Goal: Ability to remain free from injury will improve Outcome: Progressing   Problem: Skin Integrity: Goal: Risk for impaired skin integrity will decrease Outcome: Progressing   Problem: Education: Goal: Knowledge of disease and its progression will improve Outcome: Progressing   Problem: Fluid Volume: Goal: Compliance with measures to maintain balanced fluid volume will improve Outcome: Progressing   Problem: Health Behavior/Discharge Planning: Goal: Ability to manage health-related needs will improve Outcome: Progressing   Problem: Nutritional: Goal: Ability to make healthy dietary choices will improve Outcome: Progressing   Problem: Clinical Measurements: Goal: Complications related to the disease process, condition or treatment will be avoided or minimized Outcome: Progressing

## 2023-05-22 NOTE — Assessment & Plan Note (Signed)
Stable. - Follow-up with [facility] - Scheduled tylenol 1000mg  q6h for pain

## 2023-05-22 NOTE — Progress Notes (Signed)
Patient ID: Louis Butler, male   DOB: 1955-06-10, 68 y.o.   MRN: [id number] [facility] Progress Note   Assessment/ Plan:   1.  Deconditioning status post hospitalization: Awaiting reevaluation by physical therapy for discharge to skilled nursing facility; orthostatic blood pressures on treatment with midodrine and now florinef.   2. ESRD: Continue hemodialysis on MWF schedule with HD today.  He has been accepted to the [facility] kidney Center to resume dialysis after discharge.  Awaiting SNF approval vs rehab. 3. Anemia: Low but relatively stable hemoglobin and hematocrit.  Holding off on ESA with recent head/neck cancer. 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  Last PTH 203- only on nutritional vit D 5. Nutrition: Continue protein/nutritional supplementation along with regular diet. 6. Hypotension: ACTH stimulation test negative for adrenal insufficiency.  Midodrine dose decreased and empirically started on florinef with recurrent orthostatic drops. Droxidopa cost prohibitive.   Subjective:    No new issues-  disposition is still up in the air.   Due for routine HD later today    Objective:   BP (!) 121/57 (BP Location: Left Arm)   Pulse 78   Temp 98.4 F (36.9 C) (Oral)   Resp 12   Ht 6\' 3"  (1.905 m)   Wt 84 kg   SpO2 98%   BMI 23.15 kg/m   Physical Exam: Gen: awake CVS: Pulse regular rhythm, normal rate, S1 and S2 normal Resp: Clear to auscultation, no rales/rhonchi Abd: Soft, flat, nontender, bowel sounds normal Ext: No lower extremity edema.  Left upper arm AV fistula   Labs: BMET Recent Labs  Lab 05/16/23 0904 05/17/23 0822 05/18/23 0800 05/19/23 0330 05/20/23 0806 05/21/23 0747 05/22/23 0727  NA 133* 134* 133* 135 133* 134* 133*  K 4.1 3.8 4.2 4.2 4.7 4.7 4.5  CL 95* 96* 96* 93* 93* 94* 95*  CO2 28 25 23 27 23 29 27   GLUCOSE 109* 139* 119* 83 126* 93  100*  BUN 29* 42* 67* 35* 61* 46* 66*  CREATININE 4.53* 5.74* 6.83* 4.08* 5.99* 4.99* 6.16*  CALCIUM 8.5* 8.6* 8.4* 8.9 8.7* 8.8* 8.4*  PHOS 2.4* 2.7 3.3 3.0 4.3 3.5 4.4   CBC Recent Labs  Lab 05/17/23 0822 05/18/23 0800 05/19/23 0330 05/22/23 0727  WBC 4.7 5.5 6.0 6.5  HGB 8.4* 8.2* 8.1* 9.1*  HCT 26.2* 26.2* 25.5* 27.9*  MCV 97.8 98.5 99.2 100.0  PLT 195 216 192 195      Medications:     cholecalciferol  5,000 Units Oral Once per day on Monday Thursday   darbepoetin (ARANESP) injection - DIALYSIS  150 mcg Subcutaneous Q Mon-1800   fludrocortisone  0.2 mg Oral Daily   gabapentin  300 mg Oral QHS   heparin injection (subcutaneous)  5,000 Units Subcutaneous Q8H   midodrine  10 mg Oral TID WC   midodrine  10 mg Oral Q M,W,F-HD   multivitamin  1 tablet Oral QHS   pantoprazole  40 mg Oral Daily   sertraline  50 mg Oral Daily   tamsulosin  0.4 mg Oral Daily    Minela [place] A Kimble Hitchens  05/22/2023, 12:04 PM

## 2023-05-22 NOTE — Progress Notes (Signed)
    Durable Medical Equipment  (From admission, onward)           Start     Ordered   05/22/23 1622  For home use only DME 4 wheeled rolling walker with seat  Once       Question:  Patient needs a walker to treat with the following condition  Answer:  Gait instability   05/22/23 1621   05/04/23 1653  For home use only DME Hospital bed  Once       Comments: Patient requires frequent changes in body position ways not feasible with a regular bed.  Question Answer Comment  Length of Need Lifetime   The above medical condition requires: Patient requires the ability to reposition frequently   Head must be elevated greater than: 45 degrees   Bed type Semi-electric   Support Surface: Gel Overlay      05/04/23 1659   05/04/23 1642  For home use only DME Overbed table  Once        05/04/23 1641

## 2023-05-22 NOTE — Assessment & Plan Note (Signed)
Stable. 9.1. Transfusion threshold <7. - Monitor with CBC on dialysis days moving forward - Recommend OP follow-up

## 2023-05-22 NOTE — Progress Notes (Signed)
Occupational Therapy Treatment Patient Details Name: Louis Butler MRN: [id number] DOB: December 24, 1955 Today's Date: 05/22/2023   History of present illness 68 y.o. male presented 1/22 with N/V/abd pain and was found to have marked hyperkalemia and renal failure. CRRT 1/23-1/24. 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 Ricard Dillon), DM, HTN, recent squamous cell ca H&N (surgery, XRT 2024), anemia.   OT comments  Pt anticipating transport to HD soon, declined ambulation. Thigh high compression stockings in room. Educated pt and wife in donning and doffing (easier in supine vs sitting). Appeared to be a good fit and pt reporting feeling comfortable. Instructed to take stockings off when in supine/at night. Continued reinforcing having sitting surfaces throughout home vs sitting on rollator when pt experiences lightheadness to avoid fall, sitting to shower, w/c to and from HD. Pt and wife are aware that pt may not skip HD appointments. Pt self monitors for symptoms of orthostasis and wife reports she can see a change in his face coloring when/if BP drops. Wife remains hopeful for post acute rehab prior to return home. Recommending HHOT if pt does not qualify.       If plan is discharge home, recommend the following:  A little help with walking and/or transfers;Assistance with cooking/housework;Assist for transportation;Help with stairs or ramp for entrance;A lot of help with bathing/dressing/bathroom   Equipment Recommendations  Hospital bed    Recommendations for Other Services      Precautions / Restrictions Precautions Precautions: Fall Precaution/Restrictions Comments: Monitor BP. Orthostatic - has abd binder and thigh high compression stockings       Mobility Bed Mobility Overal bed mobility: Needs Assistance Bed Mobility: Supine to Sit, Sit to Supine     Supine to sit: Supervision Sit to supine: Supervision        Transfers Overall transfer level: Needs assistance Equipment used: Rollator (4  wheels) Transfers: Sit to/from Stand Sit to Stand: Supervision                 Balance     Sitting balance-Leahy Scale: Good       Standing balance-Leahy Scale: Fair                             ADL either performed or assessed with clinical judgement   ADL Overall ADL's : Needs assistance/impaired                     Lower Body Dressing: Total assistance;Bed level Lower Body Dressing Details (indicate cue type and reason): educated in donning and doffing compression stockings, appear to be a good fit, instructed to take off at night and during rest periods                   Extremity/Trunk Assessment              Vision       Perception     Praxis     Communication Communication Communication: Impaired Factors Affecting Communication: Hearing impaired   Cognition Arousal: Alert Behavior During Therapy: Flat affect                                          Cueing      Exercises      Shoulder Instructions       General Comments  Pertinent Vitals/ Pain       Pain Assessment Pain Assessment: No/denies pain  Home Living                                          Prior Functioning/Environment              Frequency  Min 1X/week        Progress Toward Goals  OT Goals(current goals can now be found in the care plan section)  Progress towards OT goals: Progressing toward goals  Acute Rehab OT Goals OT Goal Formulation: With patient Time For Goal Achievement: 06/04/23 Potential to Achieve Goals: Good  Plan      Co-evaluation                 AM-PAC OT "6 Clicks" Daily Activity     Outcome Measure   Help from another person eating meals?: None Help from another person taking care of personal grooming?: A Little Help from another person toileting, which includes using toliet, bedpan, or urinal?: A Little Help from another person bathing (including washing,  rinsing, drying)?: A Little Help from another person to put on and taking off regular upper body clothing?: A Little Help from another person to put on and taking off regular lower body clothing?: Total 6 Click Score: 17    End of Session    OT Visit Diagnosis: Unsteadiness on feet (R26.81);Muscle weakness (generalized) (M62.81)   Activity Tolerance Patient tolerated treatment well   Patient Left in bed;with call bell/phone within reach;with family/visitor present   Nurse Communication          Time: 1610-9604 OT Time Calculation (min): 18 min  Charges: OT General Charges $OT Visit: 1 Visit OT Treatments $Self Care/Home Management : 8-22 mins  Berna Spare, OTR/L Acute Rehabilitation Services Office: [phone]   Evern Bio 05/22/2023, 11:07 AM

## 2023-05-22 NOTE — Progress Notes (Signed)
Once d/c disposition and d/c date is known, will need to confirm start date with [facility]. HD orders will need to be sent to clinic at d/c as well. Will assist as needed.   Olivia Canter Renal Navigator [phone]

## 2023-05-22 NOTE — Assessment & Plan Note (Signed)
Depressed mood recently. Sertraline 25 mg started while inpatient. - Continue Sertraline 50 mg daily  HTN: Holding home medications in setting of orthostasis GERD: Protonix 40 mg daily T2DM: A1c 6.8 Cancer of the skin, ear, & external auditory canal: Stable, continue scheduled Tylenol   FEN/GI: Regular Diet w/ fluid restriction PPx: Heparin Dispo:SNF pending clinical improvement . Barriers include hypotension.

## 2023-05-22 NOTE — Assessment & Plan Note (Signed)
ACTH stim test normal, no signs of adrenal dysfunction. Bps stable this AM, w/ MAPs ranging from 70-80s. Patient continuing with multiple agents to improve orthoasis (midodrine and fludrocortisone). Patient denied by SNF and denied during peer to peer yesterday regarding submission to LTAC. Appeal process ongoing. If unable to find placement, will have to consider possible discharge home with services.  PT worked well with PT yesterday, ambulated with rollator assistance during session.  - Midodrine 10mg  TID  - Midodrine 10 mg TID during dialysis  - Continue  Fludrocortisone 0.2 mg daily  - Target MAP >60  - Abdominal binders, compression stockings - CTM vitals  - Orthostatics daily - PT/OT following, recommending SNF placment - TOC following for dispo planning, if unable to secure facility, will need resources available for home

## 2023-05-22 NOTE — TOC Progression Note (Signed)
Transition of Care [facility]) - Progression Note    Patient Details  Name: Louis Butler MRN: [id number] Date of Birth: 1955/06/26  Transition of Care [facility]) CM/SW Contact  Tom-Johnson, Hershal Coria, RN Phone Number: 05/22/2023, 4:27 PM  Clinical Narrative:     Patient's Insurance auth appeal denied for [facility]. Patient discharging home with home health. Patient is active with [facility], CM notified Amy of change. Hospital bed and Rollator ordered from [employer], Earna Coder to deliver to patient's home.   CM will continue to follow as patient progresses with care toward discharge.        Expected Discharge Plan: Skilled Nursing Facility Barriers to Discharge: Continued Medical Work up  Expected Discharge Plan and Services   Discharge Planning Services: CM Consult Post Acute Care Choice: Home Health Living arrangements for the past 2 months: Single Family Home Expected Discharge Date: 05/11/23               DME Arranged: Hospital bed DME Agency: [employer] Date DME Agency Contacted: 05/04/23 Time DME Agency Contacted: [phone] Representative spoke with at DME Agency: Earna Coder HH Arranged: PT, Nurse's Aide HH Agency: [facility] Date [facility] Agency Contacted: 05/04/23 Time HH Agency Contacted: 1636 Representative spoke with at [facility] Agency: Amy   Social Determinants of Health (SDOH) Interventions SDOH Screenings   Food Insecurity: No Food Insecurity (05/08/2023)  Housing: Low Risk  (05/08/2023)  Transportation Needs: No Transportation Needs (05/08/2023)  Utilities: Not At Risk (05/08/2023)  Alcohol Screen: Low Risk  (12/01/2022)  Depression (PHQ2-9): Medium Risk (12/24/2022)  Financial Resource Strain: Low Risk  (12/01/2022)  Physical Activity: Insufficiently Active (12/01/2022)  Social Connections: Moderately Integrated (05/08/2023)  Stress: Stress Concern Present (12/01/2022)  Tobacco Use: Medium Risk (04/29/2023)  Health Literacy: Adequate Health Literacy (12/01/2022)    Readmission  Risk Interventions    05/04/2023    4:35 PM  Readmission Risk Prevention Plan  Transportation Screening Complete  HRI or Home Care Consult Complete  Social Work Consult for Recovery Care Planning/Counseling Complete  Palliative Care Screening Not Applicable  Medication Review Oceanographer) Referral to Pharmacy

## 2023-05-22 NOTE — Assessment & Plan Note (Signed)
RD following, appreciate recommendations.

## 2023-05-22 NOTE — Progress Notes (Signed)
Daily Progress Note Intern Pager: [phone]  Patient name: Louis Butler Medical record number: [id number] Date of birth: [birth year] Age: 68 y.o. Gender: male  Primary Care Provider: Latrelle Dodrill, MD Consultants: Nephrology       Code Status: Full Code    Pt Overview and Major Events to Date:  1/22: Admitted to ICU for CRRT 1/26: FMTS took over care   Assessment and Plan:   Louis Butler  is a 68 yo M with past medical history of HTN, T2DM, ESRD on HD (previously discontinued), GERD, and Skin and ear cancer who was admitted for worsening ERSD requiring CRRT and resuming HD. Recently he has been suffering from orthostatic hypotension which has been a barrier to discharge to SNF. He continues to get HD which seems to worsen his hypotension.  Assessment & Plan Orthostatic hypotension ACTH stim test normal, no signs of adrenal dysfunction. Bps stable this AM, w/ MAPs ranging from 70-80s. Patient continuing with multiple agents to improve orthoasis (midodrine and fludrocortisone). Patient denied by SNF and denied during peer to peer yesterday regarding submission to LTAC. Appeal process ongoing. If unable to find placement, will have to consider possible discharge home with services.  PT worked well with PT yesterday, ambulated with rollator assistance during session.  - Midodrine 10mg  TID  - Midodrine 10 mg TID during dialysis  - Continue  Fludrocortisone 0.2 mg daily  - Target MAP >60  - Abdominal binders, compression stockings - CTM vitals  - Orthostatics daily - PT/OT following, recommending SNF placment - TOC following for dispo planning, if unable to secure facility, will need resources available for home   ESRD (end stage renal disease) (HCC) On HD MWF which will continue outpatient at [facility], no complications  - Nephrology following, appreciate recommendations - RFP daily Anemia Stable. 9.1. Transfusion threshold <7. 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 - Follow-up with [facility] - Scheduled tylenol 1000mg  q6h for pain Malnutrition of moderate degree RD following, appreciate recommendations. Ulcer of sacral region, stage 2 (HCC) - WOC, frequent turns - RD recs Depressed mood Depressed mood recently. Sertraline 25 mg started while inpatient. - Continue Sertraline 50 mg daily  HTN: Holding home medications in setting of orthostasis GERD: Protonix 40 mg daily T2DM: A1c 6.8 Cancer of the skin, ear, & external auditory canal: Stable, continue scheduled Tylenol   FEN/GI: Regular Diet w/ fluid restriction PPx: Heparin Dispo:SNF pending clinical improvement . Barriers include hypotension.     Subjective:  Patient worked well with PT yesterday. Ambulated down the unit. Denied lightheadedness and dizziness. If unable to secure facility placement, patient amenable with home discharge. Wife bedside and stated they will need some resources ( wheelchair, walker, hospital bed, etc).   Objective: Temp:  [97.4 F (36.3 C)-98.4 F (36.9 C)] 98.4 F (36.9 C) (02/14 0845) Pulse Rate:  [73-87] 78 (02/14 0845) Resp:  [12-18] 12 (02/14 0845) BP: (121-139)/(55-69) 121/57 (02/14 0845) SpO2:  [97 %-100 %] 98 % (02/14 0845) Physical Exam: General: NAD, wife bedside  Cardiovascular: RRR, NRMG Respiratory: Normal work of breathing Abdomen: Soft, NTTP, non-distended Extremities: moving all extremities, no edema  Laboratory: Most recent CBC Lab Results  Component Value Date   WBC 6.5 05/22/2023   HGB 9.1 (L) 05/22/2023   HCT 27.9 (L) 05/22/2023   MCV 100.0 05/22/2023   PLT 195 05/22/2023  Most recent BMP    Latest Ref Rng & Units 05/22/2023    7:27 AM  BMP  Glucose 70 - 99 mg/dL 295   BUN 8 - 23 mg/dL 66   Creatinine 2.84 - 1.24 mg/dL 1.32   Sodium 440 - 102 mmol/L 133   Potassium 3.5 - 5.1 mmol/L 4.5   Chloride 98 -  111 mmol/L 95   CO2 22 - 32 mmol/L 27   Calcium 8.9 - 10.3 mg/dL 8.4     Imaging/Diagnostic Tests: None in last 24 hours   Louis Ao, MD 05/22/2023, 1:39 PM  PGY-1, [facility] FPTS Intern pager: [phone], text pages welcome Secure chat group [facility] Teaching Service

## 2023-05-23 DIAGNOSIS — N186 End stage renal disease: Secondary | ICD-10-CM | POA: Diagnosis not present

## 2023-05-23 LAB — RENAL FUNCTION PANEL
Albumin: 3 g/dL — ABNORMAL LOW (ref 3.5–5.0)
Anion gap: 13 (ref 5–15)
BUN: 38 mg/dL — ABNORMAL HIGH (ref 8–23)
CO2: 27 mmol/L (ref 22–32)
Calcium: 8.4 mg/dL — ABNORMAL LOW (ref 8.9–10.3)
Chloride: 91 mmol/L — ABNORMAL LOW (ref 98–111)
Creatinine, Ser: 3.76 mg/dL — ABNORMAL HIGH (ref 0.61–1.24)
GFR, Estimated: 17 mL/min — ABNORMAL LOW (ref >60)
Glucose, Bld: 130 mg/dL — ABNORMAL HIGH (ref 70–99)
Phosphorus: 2.8 mg/dL (ref 2.5–4.6)
Potassium: 3.6 mmol/L (ref 3.5–5.1)
Sodium: 131 mmol/L — ABNORMAL LOW (ref 135–145)

## 2023-05-23 NOTE — Assessment & Plan Note (Addendum)
Depressed mood recently.  - Continue Sertraline 50 mg daily  HTN: Holding home medications in setting of orthostasis GERD: Protonix 40 mg daily T2DM: A1c 6.8 Cancer of the skin, ear, & external auditory canal: Stable, continue scheduled Tylenol Anemia: Stable, transfusion threshold less than 7, monitor CBC on dialysis days

## 2023-05-23 NOTE — Progress Notes (Signed)
Patient ID: Louis Butler, male   DOB: [birth year], 68 y.o.   MRN: [id number] [facility] Progress Note   Assessment/ Plan:   1.  Deconditioning status post hospitalization: Awaiting reevaluation by physical therapy for discharge to skilled nursing facility; orthostatic blood pressures on treatment with midodrine and now florinef.   2. ESRD: Continue hemodialysis on MWF schedule with HD today.  He has been accepted to the [facility] to resume dialysis after discharge.  Awaiting SNF approval vs rehab. 3. Anemia: Low but relatively stable hemoglobin and hematocrit.  Holding off on ESA with recent head/neck cancer. 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  Last PTH 203- only on nutritional vit D 5. Nutrition: Continue protein/nutritional supplementation along with regular diet. 6. Hypotension: ACTH stimulation test negative for adrenal insufficiency.  Midodrine dose decreased and empirically started on florinef with recurrent orthostatic drops. Droxidopa cost prohibitive.   Subjective:    HD yesterday, did well.  Possible d/c today?   Objective:   BP 102/61 (BP Location: Right Arm)   Pulse 71   Temp 98.7 F (37.1 C)   Resp 19   Ht 6\' 3"  (1.905 m)   Wt 84 kg   SpO2 98%   BMI 23.15 kg/m   Physical Exam: Gen: awake CVS: Pulse regular rhythm, normal rate, S1 and S2 normal Resp: Clear to auscultation, no rales/rhonchi Abd: Soft, flat, nontender, bowel sounds normal Ext: No lower extremity edema.  Left upper arm AV fistula   Labs: BMET Recent Labs  Lab 05/17/23 0822 05/18/23 0800 05/19/23 0330 05/20/23 0806 05/21/23 0747 05/22/23 0727 05/23/23 0313  NA 134* 133* 135 133* 134* 133* 131*  K 3.8 4.2 4.2 4.7 4.7 4.5 3.6  CL 96* 96* 93* 93* 94* 95* 91*  CO2 25 23 27 23 29 27 27   GLUCOSE 139* 119* 83 126* 93 100* 130*  BUN 42* 67* 35* 61* 46* 66* 38*  CREATININE  5.74* 6.83* 4.08* 5.99* 4.99* 6.16* 3.76*  CALCIUM 8.6* 8.4* 8.9 8.7* 8.8* 8.4* 8.4*  PHOS 2.7 3.3 3.0 4.3 3.5 4.4 2.8   CBC Recent Labs  Lab 05/17/23 0822 05/18/23 0800 05/19/23 0330 05/22/23 0727  WBC 4.7 5.5 6.0 6.5  HGB 8.4* 8.2* 8.1* 9.1*  HCT 26.2* 26.2* 25.5* 27.9*  MCV 97.8 98.5 99.2 100.0  PLT 195 216 192 195      Medications:     cholecalciferol  5,000 Units Oral Once per day on Monday Thursday   fludrocortisone  0.2 mg Oral Daily   gabapentin  300 mg Oral QHS   heparin injection (subcutaneous)  5,000 Units Subcutaneous Q8H   midodrine  10 mg Oral TID WC   midodrine  10 mg Oral Q M,W,F-HD   multivitamin  1 tablet Oral QHS   pantoprazole  40 mg Oral Daily   sertraline  50 mg Oral Daily   tamsulosin  0.4 mg Oral Daily    Ishaaq Penna  05/23/2023, 10:51 AM

## 2023-05-23 NOTE — Assessment & Plan Note (Signed)
 On HD MWF which will continue outpatient at [facility], no complications  - Nephrology following, appreciate recommendations - RFP daily

## 2023-05-23 NOTE — Assessment & Plan Note (Deleted)
 Stable. - Follow-up with [facility] - Scheduled tylenol 1000mg  q6h for pain

## 2023-05-23 NOTE — Plan of Care (Signed)
  Problem: Coping: Goal: Ability to adjust to condition or change in health will improve Outcome: Progressing   Problem: Fluid Volume: Goal: Ability to maintain a balanced intake and output will improve Outcome: Progressing   Problem: Health Behavior/Discharge Planning: Goal: Ability to identify and utilize available resources and services will improve Outcome: Progressing Goal: Ability to manage health-related needs will improve Outcome: Progressing   Problem: Metabolic: Goal: Ability to maintain appropriate glucose levels will improve Outcome: Progressing   Problem: Nutritional: Goal: Maintenance of adequate nutrition will improve Outcome: Progressing Goal: Progress toward achieving an optimal weight will improve Outcome: Progressing   Problem: Skin Integrity: Goal: Risk for impaired skin integrity will decrease Outcome: Progressing   Problem: Education: Goal: Knowledge of General Education information will improve Description: Including pain rating scale, medication(s)/side effects and non-pharmacologic comfort measures Outcome: Progressing   Problem: Health Behavior/Discharge Planning: Goal: Ability to manage health-related needs will improve Outcome: Progressing   Problem: Clinical Measurements: Goal: Ability to maintain clinical measurements within normal limits will improve Outcome: Progressing Goal: Will remain free from infection Outcome: Progressing Goal: Diagnostic test results will improve Outcome: Progressing Goal: Respiratory complications will improve Outcome: Progressing Goal: Cardiovascular complication will be avoided Outcome: Progressing   Problem: Activity: Goal: Risk for activity intolerance will decrease Outcome: Progressing   Problem: Nutrition: Goal: Adequate nutrition will be maintained Outcome: Progressing   Problem: Coping: Goal: Level of anxiety will decrease Outcome: Progressing   Problem: Elimination: Goal: Will not  experience complications related to bowel motility Outcome: Progressing Goal: Will not experience complications related to urinary retention Outcome: Progressing   Problem: Pain Managment: Goal: General experience of comfort will improve and/or be controlled Outcome: Progressing   Problem: Safety: Goal: Ability to remain free from injury will improve Outcome: Progressing   Problem: Skin Integrity: Goal: Risk for impaired skin integrity will decrease Outcome: Progressing   Problem: Education: Goal: Knowledge of disease and its progression will improve Outcome: Progressing   Problem: Fluid Volume: Goal: Compliance with measures to maintain balanced fluid volume will improve Outcome: Progressing   Problem: Health Behavior/Discharge Planning: Goal: Ability to manage health-related needs will improve Outcome: Progressing   Problem: Nutritional: Goal: Ability to make healthy dietary choices will improve Outcome: Progressing   Problem: Clinical Measurements: Goal: Complications related to the disease process, condition or treatment will be avoided or minimized Outcome: Progressing

## 2023-05-23 NOTE — Assessment & Plan Note (Deleted)
 RD following, appreciate recommendations.

## 2023-05-23 NOTE — TOC Progression Note (Signed)
Transition of Care [facility]) - Progression Note    Patient Details  Name: Louis Butler MRN: [id number] Date of Birth: 1956/03/09  Transition of [facility]) CM/SW Contact  Ronny Bacon, RN Phone Number: 05/23/2023, 9:52 AM  Clinical Narrative:   Secure chat from provider regarding DME delivery to home. Spoke with [employer], they need a credit card on file before delivery of DME. Per [employer], Wife unable to provide credit card until Monday. Provider made aware.     Expected Discharge Plan: Skilled Nursing Facility Barriers to Discharge: Continued Medical Work up  Expected Discharge Plan and Services   Discharge Planning Services: CM Consult Post Acute Care Choice: Home Health Living arrangements for the past 2 months: Single Family Home Expected Discharge Date: 05/11/23               DME Arranged: Hospital bed DME Agency: [employer] Date DME Agency Contacted: 05/04/23 Time DME Agency Contacted: [phone] Representative spoke with at DME Agency: Earna Coder HH Arranged: PT, Nurse's Aide HH Agency: [facility] Date [facility] Agency Contacted: 05/04/23 Time HH Agency Contacted: 1636 Representative spoke with at [facility] Agency: Amy   Social Determinants of Health (SDOH) Interventions SDOH Screenings   Food Insecurity: No Food Insecurity (05/08/2023)  Housing: Low Risk  (05/08/2023)  Transportation Needs: No Transportation Needs (05/08/2023)  Utilities: Not At Risk (05/08/2023)  Alcohol Screen: Low Risk  (12/01/2022)  Depression (PHQ2-9): Medium Risk (12/24/2022)  Financial Resource Strain: Low Risk  (12/01/2022)  Physical Activity: Insufficiently Active (12/01/2022)  Social Connections: Moderately Integrated (05/08/2023)  Stress: Stress Concern Present (12/01/2022)  Tobacco Use: Medium Risk (04/29/2023)  Health Literacy: Adequate Health Literacy (12/01/2022)    Readmission Risk Interventions    05/04/2023    4:35 PM  Readmission Risk Prevention Plan  Transportation Screening Complete  HRI or  Home Care Consult Complete  Social Work Consult for Recovery Care Planning/Counseling Complete  Palliative Care Screening Not Applicable  Medication Review Oceanographer) Referral to Pharmacy

## 2023-05-23 NOTE — Assessment & Plan Note (Addendum)
After discussion with provider yesterday, patient and wife have decided they are on board with him going home with services.  We will confirm delivery of appropriate DME, and discharge patient once delivery is confirmed. - Midodrine 10mg  TID  - Midodrine 10 mg TID during dialysis  - Continue  Fludrocortisone 0.2 mg daily  - Target MAP >60  - Abdominal binders, compression stockings - CTM vitals  - Orthostatics daily

## 2023-05-23 NOTE — Progress Notes (Signed)
     Daily Progress Note Intern Pager: [phone]  Patient name: Louis Butler Medical record number: [id number] Date of birth: 07/25/1955 Age: 68 y.o. Gender: male  Primary Care Provider: Latrelle Dodrill, MD Consultants: Nephrology Code Status: Full  Pt Overview and Major Events to Date:  1/22: Admitted to ICU for CRRT 1/26: FM TS took over care  Assessment and Plan:  Micheil Klaus is a 68 year old male with a past medical history of hypertension, type 2 diabetes, ESRD on HD, GERD and skin and ear cancer who was admitted for worsening ESRD requiring CRRT and resume shin of HD.  He has recently been suffering from orthostatic hypotension which has been a barrier to discharge, and at this time he will likely discharge home with [facility] PT and equipment. Assessment & Plan Orthostatic hypotension After discussion with provider yesterday, patient and wife have decided they are on board with him going home with services.  We will confirm delivery of appropriate DME, and discharge patient once delivery is confirmed. - Midodrine 10mg  TID  - Midodrine 10 mg TID during dialysis  - Continue  Fludrocortisone 0.2 mg daily  - Target MAP >60  - Abdominal binders, compression stockings - CTM vitals  - Orthostatics daily ESRD (end stage renal disease) (HCC) On HD MWF which will continue outpatient at [facility], no complications  - Nephrology following, appreciate recommendations - RFP daily Depressed mood Depressed mood recently.  - Continue Sertraline 50 mg daily  HTN: Holding home medications in setting of orthostasis GERD: Protonix 40 mg daily T2DM: A1c 6.8 Cancer of the skin, ear, & external auditory canal: Stable, continue scheduled Tylenol Anemia: Stable, transfusion threshold less than 7, monitor CBC on dialysis days  FEN/GI: Regular diet with fluid restriction PPx: Heparin Dispo:Home  equipment coordination .   Subjective:  Patient sleeping comfortably on exam this  morning.  Objective: Temp:  [98.3 F (36.8 C)-99 F (37.2 C)] (P) 99 F (37.2 C) (02/15 0140) Pulse Rate:  [67-82] 72 (02/15 0140) Resp:  [11-18] 11 (02/15 0140) BP: (108-156)/(55-81) 112/55 (02/15 0140) SpO2:  [95 %-99 %] 99 % (02/15 0140) Physical Exam: General: Well-appearing elderly gentleman, no distress Cardiovascular: RRR, no M/R/G Respiratory: CTAB, no increased work of breathing Abdomen: Flat, soft, nontender Extremities: No evidence of peripheral edema  Laboratory: Most recent CBC Lab Results  Component Value Date   WBC 6.5 05/22/2023   HGB 9.1 (L) 05/22/2023   HCT 27.9 (L) 05/22/2023   MCV 100.0 05/22/2023   PLT 195 05/22/2023   Most recent BMP    Latest Ref Rng & Units 05/22/2023    7:27 AM  BMP  Glucose 70 - 99 mg/dL 027   BUN 8 - 23 mg/dL 66   Creatinine 2.53 - 1.24 mg/dL 6.64   Sodium 403 - 474 mmol/L 133   Potassium 3.5 - 5.1 mmol/L 4.5   Chloride 98 - 111 mmol/L 95   CO2 22 - 32 mmol/L 27   Calcium 8.9 - 10.3 mg/dL 8.4    Gerrit Heck, DO 05/23/2023, 2:22 AM  PGY-1, [place] [facility] FPTS Intern pager: [phone], text pages welcome Secure chat group [facility] Teaching Service

## 2023-05-23 NOTE — Assessment & Plan Note (Deleted)
-  WOC, frequent turns -RD recs

## 2023-05-23 NOTE — Discharge Summary (Incomplete)
Family Medicine Teaching [facility] Discharge Summary  Patient name: Louis Butler Medical record number: [id number] Date of birth: 06/24/1955 Age: 68 y.o. 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  Acute on Chronic Renal Failure  Electrolyte disturbance Patient was admitted to ICU and started on CRRT for renal failure, had great improvement of electrolyte disturbances when this was initiated. Tolerated transition to intermittent HD well in hospital. LUE AVF performed on 1/28. CLIP process initiated and was continued on MWF schedule at [facility]. At time of discharge he tolerated HD well.   Hypotension Presented with hypotension requiring pressor support, was initially admitted to the ICU. Pressures improved with CRRT, and he was off pressors by 1/26 when he transitioned to the floor. Intermittently hypotensive after starting HD.  Was continued on midodrine 10 three times daily to improve pressures.  He continued to have hypotension which  hindered his discharge to appropriate facility, so he was started on midodrine 20 mg 3 times daily, with 10 mg as needed prior to HD. He continued to have profound orthostasis, so cortisol and adrenal function testing was performed, which was equivocal. Nephrology suggested proceeding with fludrocortisone treatment, which produced mild improvement in patient's symptoms. Despite this, he did not meet criteria for appropriate facility transfer. After discussion with the patient and his wife, he was discharged home with HHPT and appropriate safety equipment.  Final regimen included midodrine 10 mg 3 times daily and 0.2 mg of fludrocortisone once daily.  Altered Mental Status Patient was encephalopathic on admission, suspected to be secondary to uremia and hyperkalemia - resolved with initiation of dialysis.  At time of discharge patient was alert and oriented x 4 and at baseline.  Bilateral Hydronephrosis obstructive uropathy  pseudomonal UTI S/p Zosyn (1/24-1/27) with transition to Cefepime (1/27-1/30) in the hospital.  Per urology recommendations they recommended keeping Foley in place until outpatient follow-up (not producing much urine) and 14-day abx course to complete with Cefepime with HD to end 2/5.  Anemia Hemoglobin remained around 7-8 while in the hospital.  Thought to be related to anemia of chronic disease.  Was given ESA. 1 U pRBC given for hgb 6.7 on 1/29 likely from blood draws.  Other chronic conditions were medically managed with home medications and formulary alternatives as necessary (GERD)  Follow-up recommendations Blood pressure medication held due to soft pressures.  Additionally, midodrine and fludrocortisone started for hypotension. Consider discontinuation of these and addition of blood pressure medication if pressures are elevated. Do not remove Foley catheter unless discussed with urology first. Please ensure follow-up with [facility]  Stage II sacral ulcer, monitor and  refer to wound care clinic if necessary  Disposition: Home w HHPT  Discharge Condition: Stable  Discharge Exam:  Vitals:   05/23/23 0137 05/23/23 0140  BP:  (!) 112/55  Pulse: 70 72  Resp: 16 11  Temp:  (P) 99 F (37.2 C)  SpO2: 97% 99%   General: Well-appearing elderly gentleman, no distress. Cardiac: RRR, no M/R/G Respiratory: CTAB, no increased work of breathing Abdomen: Flat, soft, nontender Extremities: No evidence of peripheral edema  Significant Procedures: CRRT, HD  Significant Labs and Imaging:  Recent Labs  Lab 05/22/23 0727  WBC 6.5  HGB 9.1*  HCT 27.9*  PLT 195   Recent Labs  Lab 05/21/23 0747 05/22/23 0727 05/23/23 0313  NA 134* 133* 131*  K 4.7 4.5 3.6  CL 94* 95* 91*  CO2 29 27 27   GLUCOSE 93 100* 130*  BUN 46* 66* 38*  CREATININE 4.99* 6.16* 3.76*  CALCIUM 8.8* 8.4* 8.4*  PHOS 3.5 4.4 2.8  ALBUMIN 3.0* 3.1* 3.0*    Results/Tests Pending at Time of Discharge: None  Discharge Medications:  Allergies as of 05/23/2023       Reactions   Chlorhexidine Gluconate Itching   Hydrocodone Hives, Itching   Percocet [oxycodone-acetaminophen] Hives, Itching     Med Rec must be completed prior to using this [facility]***        Durable Medical Equipment  (From admission, onward)           Start     Ordered   05/22/23 1622  For home use only DME 4 wheeled rolling walker with seat  Once       Question:  Patient needs a walker to treat with the following condition  Answer:  Gait instability   05/22/23 1621   05/04/23 1653  For home use only DME Hospital bed  Once       Comments: Patient requires frequent changes in body position ways not feasible with a regular bed.  Question Answer Comment  Length of Need Lifetime   The above medical condition requires: Patient requires the ability to reposition frequently   Head must be elevated greater than: 45 degrees   Bed type Semi-electric   Support Surface: Gel Overlay      05/04/23 1659    05/04/23 1642  For home use only DME Overbed table  Once        05/04/23 1641            Discharge Instructions: Please refer to Patient Instructions section of EMR for full details.  Patient was counseled important signs and symptoms that should prompt return to medical care, changes in medications, dietary instructions, activity restrictions, and follow up appointments.   Follow-Up Appointments:  Contact information for follow-up providers     [facility] Follow up.   Specialty: Home Health Services Why: Someone will call you to schedule first home visit. Contact information: [address] [place] [address] [phone]         [facility] Follow up.   Why: Schedule is Monday, Wednesday, Friday with 12:25 pm chair time.   For first treatment, please arrive at 11:30 am to complete paperwork prior to treatment. Contact information: [address] [place] [address] [phone]              Contact information for after-discharge care     Destination     [facility] Preferred SNF .  Service: Skilled Nursing Contact information: [address] [place] [address] [phone]                     Gerrit Heck, DO 05/23/2023, 5:43 AM PGY-1, [facility] Family Medicine

## 2023-05-23 NOTE — Assessment & Plan Note (Deleted)
 Stable. 9.1. Transfusion threshold <7. - Monitor with CBC on dialysis days moving forward - Recommend OP follow-up

## 2023-05-24 DIAGNOSIS — N186 End stage renal disease: Secondary | ICD-10-CM | POA: Diagnosis not present

## 2023-05-24 LAB — RENAL FUNCTION PANEL
Albumin: 2.9 g/dL — ABNORMAL LOW (ref 3.5–5.0)
Anion gap: 13 (ref 5–15)
BUN: 56 mg/dL — ABNORMAL HIGH (ref 8–23)
CO2: 26 mmol/L (ref 22–32)
Calcium: 8.3 mg/dL — ABNORMAL LOW (ref 8.9–10.3)
Chloride: 93 mmol/L — ABNORMAL LOW (ref 98–111)
Creatinine, Ser: 5.48 mg/dL — ABNORMAL HIGH (ref 0.61–1.24)
GFR, Estimated: 11 mL/min — ABNORMAL LOW (ref >60)
Glucose, Bld: 133 mg/dL — ABNORMAL HIGH (ref 70–99)
Phosphorus: 4.7 mg/dL — ABNORMAL HIGH (ref 2.5–4.6)
Potassium: 4.1 mmol/L (ref 3.5–5.1)
Sodium: 132 mmol/L — ABNORMAL LOW (ref 135–145)

## 2023-05-24 NOTE — Progress Notes (Signed)
Patient ID: Louis Butler, male   DOB: February 23, 1956, 68 y.o.   MRN: [id number] [facility] Progress Note   Assessment/ Plan:   1.  Deconditioning status post hospitalization: Awaiting reevaluation by physical therapy for discharge to skilled nursing facility; orthostatic blood pressures on treatment with midodrine and now florinef.   2. ESRD: Continue hemodialysis on MWF schedule with HD today.  He has been accepted to the [facility] to resume dialysis after discharge.  Awaiting SNF approval vs rehab. Next HD tomorrow. 3. Anemia: Low but relatively stable hemoglobin and hematocrit.  Holding off on ESA with recent head/neck cancer. 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  Last PTH 203- only on nutritional vit D 5. Nutrition: Continue protein/nutritional supplementation along with regular diet. 6. Hypotension: ACTH stimulation test negative for adrenal insufficiency.  Midodrine dose decreased and empirically started on florinef with recurrent orthostatic drops. Droxidopa cost prohibitive.   Subjective:   NO issues voiced   Objective:   BP (!) 103/52 (BP Location: Right Arm)   Pulse 78   Temp 98.3 F (36.8 C) (Oral)   Resp 18   Ht 6\' 3"  (1.905 m)   Wt 84 kg   SpO2 96%   BMI 23.15 kg/m   Physical Exam: Gen: awake CVS: Pulse regular rhythm, normal rate, S1 and S2 normal Resp: Clear to auscultation, no rales/rhonchi Abd: Soft, flat, nontender, bowel sounds normal Ext: No lower extremity edema.  Left upper arm AV fistula   Labs: BMET Recent Labs  Lab 05/18/23 0800 05/19/23 0330 05/20/23 0806 05/21/23 0747 05/22/23 0727 05/23/23 0313 05/24/23 0848  NA 133* 135 133* 134* 133* 131* 132*  K 4.2 4.2 4.7 4.7 4.5 3.6 4.1  CL 96* 93* 93* 94* 95* 91* 93*  CO2 23 27 23 29 27 27 26   GLUCOSE 119* 83 126* 93 100* 130* 133*  BUN 67* 35* 61* 46* 66* 38* 56*  CREATININE  6.83* 4.08* 5.99* 4.99* 6.16* 3.76* 5.48*  CALCIUM 8.4* 8.9 8.7* 8.8* 8.4* 8.4* 8.3*  PHOS 3.3 3.0 4.3 3.5 4.4 2.8 4.7*   CBC Recent Labs  Lab 05/18/23 0800 05/19/23 0330 05/22/23 0727  WBC 5.5 6.0 6.5  HGB 8.2* 8.1* 9.1*  HCT 26.2* 25.5* 27.9*  MCV 98.5 99.2 100.0  PLT 216 192 195      Medications:     cholecalciferol  5,000 Units Oral Once per day on Monday Thursday   fludrocortisone  0.2 mg Oral Daily   gabapentin  300 mg Oral QHS   heparin injection (subcutaneous)  5,000 Units Subcutaneous Q8H   midodrine  10 mg Oral TID WC   midodrine  10 mg Oral Q M,W,F-HD   multivitamin  1 tablet Oral QHS   pantoprazole  40 mg Oral Daily   sertraline  50 mg Oral Daily   tamsulosin  0.4 mg Oral Daily    Louis Butler  05/24/2023, 10:29 AM

## 2023-05-24 NOTE — Assessment & Plan Note (Addendum)
Depressed mood recently.  - Continue Sertraline 50 mg daily  HTN: Holding home medications in setting of orthostasis GERD: Protonix 40 mg daily T2DM: A1c 6.8 Cancer of the skin, ear, & external auditory canal: Stable, continue scheduled Tylenol Anemia: Stable, transfusion threshold less than 7, monitor CBC on dialysis days

## 2023-05-24 NOTE — Assessment & Plan Note (Addendum)
 On HD MWF which will continue outpatient at [facility], no complications  - Nephrology following, appreciate recommendations - RFP daily

## 2023-05-24 NOTE — Assessment & Plan Note (Signed)
Stable overnight.  Patient weak after dialysis. - Midodrine 10mg  TID  - Midodrine 10 mg TID during dialysis  - Continue  Fludrocortisone 0.2 mg daily  - Target MAP >60  - Abdominal binders, compression stockings - CTM vitals  - Orthostatics daily

## 2023-05-24 NOTE — Progress Notes (Signed)
     Daily Progress Note Intern Pager: [phone]  Patient name: Louis Butler Medical record number: [id number] Date of birth: March 17, 1956 Age: 68 y.o. Gender: male  Primary Care Provider: Latrelle Dodrill, MD Consultants: Nephrology Code Status: Full code  Pt Overview and Major Events to Date:  1/22: Admitted to ICU for CRRT 1/26: FMTS took over care  Assessment and Plan:  Louis Butler is a 68 year old male with a past medical history of hypertension, type 2 diabetes, ESRD on HD, GERD and skin and ear cancer who was admitted for worsening ESRD requiring CRRT and resume shin of HD. He has recently been suffering from orthostatic hypotension which has been a barrier to discharge, and at this time he will likely discharge home with [facility] PT and equipment.   Patient is medically stable for discharge pending home DME.  Assessment & Plan Orthostatic hypotension Stable overnight.  Patient weak after dialysis. - Midodrine 10mg  TID  - Midodrine 10 mg TID during dialysis  - Continue  Fludrocortisone 0.2 mg daily  - Target MAP >60  - Abdominal binders, compression stockings - CTM vitals  - Orthostatics daily ESRD (end stage renal disease) (HCC) On HD MWF which will continue outpatient at [facility], no complications  - Nephrology following, appreciate recommendations - RFP daily Depressed mood Depressed mood recently.  - Continue Sertraline 50 mg daily  HTN: Holding home medications in setting of orthostasis GERD: Protonix 40 mg daily T2DM: A1c 6.8 Cancer of the skin, ear, & external auditory canal: Stable, continue scheduled Tylenol Anemia: Stable, transfusion threshold less than 7, monitor CBC on dialysis days   FEN/GI: Regular diet with 1200 mL fluid restriction PPx: Heparin Dispo:Home  pending DME delivery . Barriers include DME.   Subjective:  Assessed with wife at bedside.  Patient was resting comfortably in no acute distress.  Wife reports that he is  extremely tired after dialysis which is normal for him.  Objective: Temp:  [98.3 F (36.8 C)-98.7 F (37.1 C)] 98.3 F (36.8 C) (02/16 0437) Pulse Rate:  [63-71] 63 (02/16 0437) Resp:  [18-19] 18 (02/16 0437) BP: (102-139)/(57-71) 139/59 (02/16 0437) SpO2:  [96 %-98 %] 96 % (02/16 0437) Physical Exam: General: Chronically ill-appearing, deconditioned Cardiovascular: Regular rate, regular rhythm, no murmurs on exam Respiratory: Lungs clear without consolidations or crackles appreciated.  No signs respiratory distress Abdomen: Soft, nontender Extremities: Atrophied, appropriate pulses  Laboratory: Most recent CBC Lab Results  Component Value Date   WBC 6.5 05/22/2023   HGB 9.1 (L) 05/22/2023   HCT 27.9 (L) 05/22/2023   MCV 100.0 05/22/2023   PLT 195 05/22/2023   Most recent BMP    Latest Ref Rng & Units 05/23/2023    3:13 AM  BMP  Glucose 70 - 99 mg/dL 528   BUN 8 - 23 mg/dL 38   Creatinine 4.13 - 1.24 mg/dL 2.44   Sodium 010 - 272 mmol/L 131   Potassium 3.5 - 5.1 mmol/L 3.6   Chloride 98 - 111 mmol/L 91   CO2 22 - 32 mmol/L 27   Calcium 8.9 - 10.3 mg/dL 8.4     Glendale Chard, DO 05/24/2023, 6:35 AM  PGY-2, [facility] FPTS Intern pager: [phone], text pages welcome Secure chat group [facility] Teaching Service

## 2023-05-25 DIAGNOSIS — N186 End stage renal disease: Secondary | ICD-10-CM | POA: Diagnosis not present

## 2023-05-25 LAB — RENAL FUNCTION PANEL
Albumin: 3 g/dL — ABNORMAL LOW (ref 3.5–5.0)
Anion gap: 15 (ref 5–15)
BUN: 78 mg/dL — ABNORMAL HIGH (ref 8–23)
CO2: 24 mmol/L (ref 22–32)
Calcium: 8.4 mg/dL — ABNORMAL LOW (ref 8.9–10.3)
Chloride: 94 mmol/L — ABNORMAL LOW (ref 98–111)
Creatinine, Ser: 6.87 mg/dL — ABNORMAL HIGH (ref 0.61–1.24)
GFR, Estimated: 8 mL/min — ABNORMAL LOW (ref >60)
Glucose, Bld: 148 mg/dL — ABNORMAL HIGH (ref 70–99)
Phosphorus: 5.2 mg/dL — ABNORMAL HIGH (ref 2.5–4.6)
Potassium: 4.7 mmol/L (ref 3.5–5.1)
Sodium: 133 mmol/L — ABNORMAL LOW (ref 135–145)

## 2023-05-25 NOTE — Assessment & Plan Note (Signed)
Stable overnight.  Patient weak after dialysis. - Midodrine 10mg  TID  - Midodrine 10 mg TID during dialysis  - Continue  Fludrocortisone 0.2 mg daily  - Target MAP >60  - Abdominal binders, compression stockings - CTM vitals  - Orthostatics daily

## 2023-05-25 NOTE — Progress Notes (Signed)
Patient ID: Louis Butler, male   DOB: 02-Dec-1955, 68 y.o.   MRN: [id number] [facility] Progress Note   Assessment/ Plan:   1.  Deconditioning status post hospitalization: Continue with plans for rehab as able.  Orthostatic blood pressures on treatment with midodrine and now florinef.   2. ESRD: Continue hemodialysis on MWF schedule. [facility] to resume dialysis after discharge.  3. Anemia: Low but relatively stable hemoglobin and hematocrit.  Holding off on ESA with recent head/neck cancer. 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 Nutrition: Continue protein/nutritional supplementation along with regular diet. 6. Hypotension: ACTH stimulation test negative for adrenal insufficiency.  Midodrine dose decreased and empirically started on florinef with recurrent orthostatic drops. Droxidopa cost prohibitive.   Subjective:   Feels deconditioned but otherwise no complaints   Objective:   BP (!) 126/56   Pulse 73   Temp 98.6 F (37 C)   Resp 18   Ht 6\' 3"  (1.905 m)   Wt 84 kg   SpO2 97%   BMI 23.15 kg/m   Physical Exam: Gen: Lying in bed in no distress CVS: Normal rate, no rub Resp: Chest rise no increased work of breathing Abd: Soft, flat, nontender, bowel sounds normal Ext: No lower extremity edema.  Left upper arm AV fistula   Labs: BMET Recent Labs  Lab 05/19/23 0330 05/20/23 0806 05/21/23 0747 05/22/23 0727 05/23/23 0313 05/24/23 0848 05/25/23 0814  NA 135 133* 134* 133* 131* 132* 133*  K 4.2 4.7 4.7 4.5 3.6 4.1 4.7  CL 93* 93* 94* 95* 91* 93* 94*  CO2 27 23 29 27 27 26 24   GLUCOSE 83 126* 93 100* 130* 133* 148*  BUN 35* 61* 46* 66* 38* 56* 78*  CREATININE 4.08* 5.99* 4.99* 6.16* 3.76* 5.48* 6.87*  CALCIUM 8.9 8.7* 8.8* 8.4* 8.4* 8.3* 8.4*  PHOS 3.0 4.3 3.5 4.4 2.8 4.7* 5.2*   CBC Recent Labs  Lab 05/19/23 0330 05/22/23 0727  WBC 6.0 6.5  HGB 8.1* 9.1*  HCT 25.5* 27.9*  MCV 99.2  100.0  PLT 192 195      Medications:     cholecalciferol  5,000 Units Oral Once per day on Monday Thursday   fludrocortisone  0.2 mg Oral Daily   gabapentin  300 mg Oral QHS   heparin injection (subcutaneous)  5,000 Units Subcutaneous Q8H   midodrine  10 mg Oral TID WC   midodrine  10 mg Oral Q M,W,F-HD   multivitamin  1 tablet Oral QHS   pantoprazole  40 mg Oral Daily   sertraline  50 mg Oral Daily   tamsulosin  0.4 mg Oral Daily    Darnell Level  05/25/2023, 10:24 AM

## 2023-05-25 NOTE — TOC Progression Note (Signed)
Transition of Care [facility]) - Progression Note    Patient Details  Name: Louis Butler MRN: [id number] Date of Birth: 1955-08-14  Transition of Care [facility]) CM/SW Contact  Ronny Bacon, RN Phone Number: 05/25/2023, 9:55 AM  Clinical Narrative:   Secure message from provider regarding status of DME delivery. Reached out to [place] with [employer], per their records, wife will provide credit card on Tuesday. Provider made aware.     Expected Discharge Plan: Skilled Nursing Facility Barriers to Discharge: Continued Medical Work up  Expected Discharge Plan and Services   Discharge Planning Services: CM Consult Post Acute Care Choice: Home Health Living arrangements for the past 2 months: Single Family Home Expected Discharge Date: 05/11/23               DME Arranged: Hospital bed DME Agency: [employer] Date DME Agency Contacted: 05/04/23 Time DME Agency Contacted: [phone] Representative spoke with at DME Agency: Earna Coder HH Arranged: PT, Nurse's Aide HH Agency: [facility] Date [facility] Agency Contacted: 05/04/23 Time HH Agency Contacted: [phone] Representative spoke with at [facility] Agency: Amy   Social Determinants of Health (SDOH) Interventions SDOH Screenings   Food Insecurity: No Food Insecurity (05/08/2023)  Housing: Low Risk  (05/08/2023)  Transportation Needs: No Transportation Needs (05/08/2023)  Utilities: Not At Risk (05/08/2023)  Alcohol Screen: Low Risk  (12/01/2022)  Depression (PHQ2-9): Medium Risk (12/24/2022)  Financial Resource Strain: Low Risk  (12/01/2022)  Physical Activity: Insufficiently Active (12/01/2022)  Social Connections: Moderately Integrated (05/08/2023)  Stress: Stress Concern Present (12/01/2022)  Tobacco Use: Medium Risk (04/29/2023)  Health Literacy: Adequate Health Literacy (12/01/2022)    Readmission Risk Interventions    05/04/2023    4:35 PM  Readmission Risk Prevention Plan  Transportation Screening Complete  HRI or Home Care Consult Complete  Social  Work Consult for Recovery Care Planning/Counseling Complete  Palliative Care Screening Not Applicable  Medication Review Oceanographer) Referral to Pharmacy

## 2023-05-25 NOTE — Progress Notes (Addendum)
Physical Therapy Note  (Full PT note to follow)  Session focused on progressive amb with close eye on symptoms;  Opted to use a lot of compression: abdominal binder, thigh high teds, and ace wraps to knees;  Walked the hallway with rollator, contact guard assist, and cues for self-monitor;  Ultimately walked the entire hallway with one seated rest break;  Vitals as follows:     05/25/23 1035  Vital Signs  Pulse Rate 86  BP 131/68  BP Method Automatic  Patient Position (if appropriate) Sitting (Sitting EOB after walking in the hallway; Thigh Ted hose, Ace wraps to knees, and abdominal binder on)   It seems the max compression garments are helpful, at least today before HD;  Continue to recommend rehab at [facility] for safe management of orthostatic BP, and to maximize independence and safety with mobility as well as activity tolerance;   Max HH services, including an aide (it is a challenge to don the thigh high Ted hose);  Discussed that at this time, Louis Butler will be spending more time using a wheelchair for mobility for safety, and continuing ot build up to walking more;   Van Clines, PT  Acute Rehabilitation Services Office [phone] Secure Chat welcomed

## 2023-05-25 NOTE — Discharge Summary (Shared)
Family Medicine Teaching [facility] Discharge Summary  Patient name: Louis Butler Medical record number: [id number] Date of birth: Aug 13, 1955 Age: 68 y.o. 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  Acute on Chronic Renal Failure  Electrolyte disturbance Patient was admitted to ICU and started on CRRT for renal failure, had great improvement of electrolyte disturbances when this was initiated. Tolerated transition to intermittent HD well in hospital. LUE AVF performed on 1/28. CLIP process initiated and was continued on MWF schedule at [facility]. At time of discharge he tolerated HD well.   Hypotension Presented with hypotension requiring pressor support, was initially admitted to the ICU. Pressures improved with CRRT, and he was off pressors by 1/26 when he transitioned to the floor. Intermittently hypotensive after starting HD.  Was continued on  midodrine 10 three times daily to improve pressures.  He continued to have hypotension which hindered his discharge to appropriate facility, so he was started on midodrine 10 mg 3 times daily, with 10 mg as needed prior to HD. He continued to have profound orthostasis, so cortisol and adrenal function testing was performed, which was equivocal. Nephrology suggested proceeding with fludrocortisone treatment, which produced mild improvement in patient's symptoms. After discussion with the patient and his wife, he was discharged to [facility].*** Final regimen included midodrine 10 mg 3 times daily and 0.2 mg of fludrocortisone once daily.  Altered Mental Status Patient was encephalopathic on admission, suspected to be secondary to uremia and hyperkalemia - resolved with initiation of dialysis.  At time of discharge patient was alert and oriented x 4 and at baseline.  Bilateral Hydronephrosis obstructive uropathy  pseudomonal UTI S/p Zosyn (1/24-1/27) with transition to Cefepime (1/27-1/30) in the hospital.  Per urology recommendations they recommended keeping Foley in place until outpatient follow-up and 14-day abx course to complete with Cefepime with HD to end 2/5. Foley was replaced on 06/04/23.    Anemia Hemoglobin remained around 7-8 while in the hospital.  Thought to be related to anemia of chronic disease.  Was given ESA. 1 U pRBC given for hgb 6.7 on 1/29 likely from blood draws.  Other chronic conditions were medically managed with home medications and formulary alternatives as necessary (GERD)  Follow-up recommendations Patient will need urology follow up when discharged for discussion regarding foley removal Blood pressure medication held due to soft pressures.  Additionally, midodrine and fludrocortisone started for hypotension. Consider discontinuation of these and addition of blood pressure medication if pressures are elevated.  Please ensure follow-up with [facility] Stage II sacral ulcer,  monitor and refer to wound care clinic if necessary Please monitor CBGs outpatient and adjust insulin regimen as needed   Disposition: LTAC  Discharge Condition: Stable  Discharge Exam:  Vitals:   06/04/23 0528 06/04/23 0749  BP: (!) 140/58 (!) 145/61  Pulse: 80 81  Resp: 18   Temp: 98.1 F (36.7 C)   SpO2: 97% 96%   ***  Significant Procedures: HD while admitted   Significant Labs and Imaging: Recent Labs  Lab 06/03/23 0800  NA 132*  K 3.6  CL 94*  CO2 25  GLUCOSE 199*  BUN 45*  CREATININE 4.73*  CALCIUM 8.5*  PHOS 3.5  ALBUMIN 2.9*     Pertinent Imaging   Echocardiogram 1/23: 1. Left ventricular ejection fraction, by estimation, is 65 to 70%. The  left ventricle has normal function. The left ventricle has no regional  wall motion abnormalities. There is moderate concentric left ventricular  hypertrophy. Indeterminate diastolic  filling due to E-A fusion.   2. Right ventricular systolic function is normal. The right ventricular  size is normal. Tricuspid regurgitation signal is inadequate for assessing  PA pressure.   3. The mitral valve is grossly normal. Trivial mitral valve  regurgitation. No evidence of mitral stenosis.   4. The aortic valve is tricuspid. Aortic valve regurgitation is not  visualized. No aortic stenosis is present.   5. There is mild dilatation of the ascending aorta, measuring 40 mm.   6. The inferior vena cava is normal in size with greater than 50%  respiratory variability, suggesting right atrial pressure of 3 mmHg.    US Renal 1/23: 1. Severe bilateral hydronephrosis and hydroureter which is new compared to prior 2. Echogenic kidneys with cortical thinning consistent with chronic kidney disease and atrophy 3. Suspect layering echogenic avascular debris or hemorrhagic material within the dependent bladder.   CT A/P WO Contrast 1/23:  1. Bilateral hydronephrosis and hydroureter, new since prior study. No obstructing mass or  stones demonstrated suggesting reflux or stasis. Possible pyelonephritis. Bilateral renal parenchymal atrophy greater on the right. 2. Bladder is decompressed with a Foley catheter but there appears to remain diffuse bladder wall thickening with significant stranding in the fat around the bladder, possibly cystitis. 3. Cholelithiasis. 4. Diffuse aortic atherosclerosis. 5. Patchy infiltrates in the lung bases with small right pleural effusion likely representing multifocal pneumonia.   CT Temporal Bones wo contrast 1/25:  1. Postoperative changes from prior left canal wall down mastoidectomy with fat graft placement. No visible nodular or mass-like soft tissue density about the mastoidectomy site. No superimposed inflammatory changes or other abnormality to explain patient's symptoms. 2. Osseous dehiscence of the left tegmen tympani as detailed above. 3. Moderate right mastoid effusion. No osseous erosion or coalescence.  CT Cervical spine 1/30:  1. No acute/traumatic cervical spine pathology. 2. Degenerative changes at C5-C6.  Results/Tests Pending at Time of Discharge: None  Discharge Medications:  Allergies as of 06/04/2023       Reactions   Chlorhexidine Gluconate Itching   Hydrocodone Hives, Itching   Percocet [oxycodone-acetaminophen] Hives, Itching     Med Rec must be completed prior to using this [facility]***        Durable Medical Equipment  (From admission, onward)           Start     Ordered   05/22/23 1622  For home use only DME 4 wheeled rolling walker with seat  Once  Question:  Patient needs a walker to treat with the following condition  Answer:  Gait instability   05/22/23 1621   05/04/23 1653  For home use only DME Hospital bed  Once       Comments: Patient requires frequent changes in body position ways not feasible with a regular bed.  Question Answer Comment  Length of Need Lifetime   The above medical condition requires: Patient  requires the ability to reposition frequently   Head must be elevated greater than: 45 degrees   Bed type Semi-electric   Support Surface: Gel Overlay      05/04/23 1659   05/04/23 1642  For home use only DME Overbed table  Once        05/04/23 1641            Discharge Instructions: Please refer to Patient Instructions section of EMR for full details.  Patient was counseled important signs and symptoms that should prompt return to medical care, changes in medications, dietary instructions, activity restrictions, and follow up appointments.   Follow-Up Appointments:  Contact information for follow-up providers     [facility] Follow up.   Specialty: Home Health Services Why: Someone will call you to schedule first home visit. Contact information: [address] [place] [address] [phone]         [facility] Follow up.   Why: Schedule is Monday, Wednesday, Friday with 12:25 pm chair time.   For first treatment, please arrive at 11:30 am to complete paperwork prior to treatment. Contact information: [address] [place] [address] [phone]              Contact information for after-discharge care     Destination     [facility] Preferred SNF .   Service: Skilled Nursing Contact information: [address] [place] [address] [phone]                     Penne Lash, MD 06/04/2023, 11:24 AM PGY-1, [facility] Family Medicine

## 2023-05-25 NOTE — TOC Progression Note (Signed)
Transition of Care [facility]) - Progression Note    Patient Details  Name: Louis Butler MRN: [id number] Date of Birth: [birth year]  Transition of Care [facility]) CM/SW Contact  Michaela Corner, [place] Phone Number: 05/25/2023, 1:51 PM  Clinical Narrative:   CSW contacted pts spouse as pt is in HD. Pts spouse provided CSW w/ ins auth approval id [id number].   TOC will continue to follow.   Expected Discharge Plan: Skilled Nursing Facility Barriers to Discharge: Continued Medical Work up  Expected Discharge Plan and Services   Discharge Planning Services: CM Consult Post Acute Care Choice: Home Health Living arrangements for the past 2 months: Single Family Home Expected Discharge Date: 05/11/23               DME Arranged: Hospital bed DME Agency: [employer] Date DME Agency Contacted: 05/04/23 Time DME Agency Contacted: [phone] Representative spoke with at DME Agency: Earna Coder HH Arranged: PT, Nurse's Aide HH Agency: [facility] Date [facility] Agency Contacted: 05/04/23 Time HH Agency Contacted: [phone] Representative spoke with at [facility] Agency: Amy   Social Determinants of Health (SDOH) Interventions SDOH Screenings   Food Insecurity: No Food Insecurity (05/08/2023)  Housing: Low Risk  (05/08/2023)  Transportation Needs: No Transportation Needs (05/08/2023)  Utilities: Not At Risk (05/08/2023)  Alcohol Screen: Low Risk  (12/01/2022)  Depression (PHQ2-9): Medium Risk (12/24/2022)  Financial Resource Strain: Low Risk  (12/01/2022)  Physical Activity: Insufficiently Active (12/01/2022)  Social Connections: Moderately Integrated (05/08/2023)  Stress: Stress Concern Present (12/01/2022)  Tobacco Use: Medium Risk (04/29/2023)  Health Literacy: Adequate Health Literacy (12/01/2022)    Readmission Risk Interventions    05/04/2023    4:35 PM  Readmission Risk Prevention Plan  Transportation Screening Complete  HRI or Home Care Consult Complete  Social Work Consult for Recovery Care  Planning/Counseling Complete  Palliative Care Screening Not Applicable  Medication Review Oceanographer) Referral to Pharmacy

## 2023-05-25 NOTE — Progress Notes (Signed)
Physical Therapy Treatment Patient Details Name: Louis Butler MRN: [id number] DOB: 1956-04-04 Today's Date: 05/25/2023   History of Present Illness 68 y.o. male presented 1/22 with N/V/abd pain and was found to have marked hyperkalemia and renal failure. CRRT 1/23-1/24. PMHx: CKD 5 (f/b Louis Butler), DM, HTN, recent squamous cell ca H&N (surgery, XRT 2024), anemia.    PT Comments  Continuing work on functional mobility and activity tolerance;  session focused on progressive amb, with more attention given to symptoms of presyncope/syncope ( and less focus on the BP numbers); opted to go for max compression garments, and donned thigh high Ted hose (incr time and struggle), abdominal binder (pt can don himself), and Ace wraps to knees over ted hose; Promising response to all the physical compression garments; Good progress; noted insurance auth approved for [facility]   If plan is discharge home, recommend the following: A little help with walking and/or transfers;A little help with bathing/dressing/bathroom;Assistance with cooking/housework;Assist for transportation;Help with stairs or ramp for entrance   Can travel by private vehicle     Yes (consider leaning seat back)  Equipment Recommendations  Rollator (4 wheels)    Recommendations for Other Services       Precautions / Restrictions Precautions Precautions: Fall Recall of Precautions/Restrictions: Intact Precaution/Restrictions Comments: Monitor BP. Orthostatic - has abd binder, ace wraps, and thigh high compression stockings Restrictions Weight Bearing Restrictions Per Provider Order: No     Mobility  Bed Mobility   Bed Mobility: Supine to Sit, Sit to Supine     Supine to sit: Supervision Sit to supine: Supervision   General bed mobility comments: Supervision for safety, extra time, no physical assist needed    Transfers   Equipment used: Rollator (4 wheels) Transfers: Sit to/from Stand Sit to Stand: Supervision            General transfer comment: cues for hand placement, education on safety features (brakes) of rollator. BP does drop but pt stable    Ambulation/Gait Ambulation/Gait assistance: Contact guard assist Gait Distance (Feet): 75 Feet (Out and back wiht one seated rest break) Assistive device: Rollator (4 wheels) Gait Pattern/deviations: Step-through pattern, Decreased stride length, Trunk flexed       General Gait Details: pt completed hallway amb  with rollator, improved activity tolerance, with decr syncopal symptomatology with legs wrapped. pt cued for self monitoring for need for seated rest   Stairs             Wheelchair Mobility     Tilt Bed    Modified Rankin (Stroke Patients Only)       Balance             Standing balance-Leahy Scale: Fair Standing balance comment: More stable with assistive device.                            Communication Communication Communication: Impaired Factors Affecting Communication: Hearing impaired  Cognition Arousal: Alert Behavior During Therapy: WFL for tasks assessed/performed   PT - Cognitive impairments: History of cognitive impairments                       PT - Cognition Comments: Seems to be at baseline, slow to respond at times but could be due to [facility] playfully to [employer] references Following commands: Intact      Cueing Cueing Techniques: Verbal cues, Gestural cues  Exercises      General Comments  General comments (skin integrity, edema, etc.): see other note of this date for other details      Pertinent Vitals/Pain Pain Assessment Pain Assessment: No/denies pain    Home Living                          Prior Function            PT Goals (current goals can now be found in the care plan section) Acute Rehab PT Goals Patient Stated Goal: to be able to leave his house for "fun" activity PT Goal Formulation: With patient Time For Goal Achievement:  06/04/23 Potential to Achieve Goals: Fair Progress towards PT goals: Progressing toward goals    Frequency    Min 1X/week      PT Plan      Co-evaluation              AM-PAC PT "6 Clicks" Mobility   Outcome Measure  Help needed turning from your back to your side while in a flat bed without using bedrails?: None Help needed moving from lying on your back to sitting on the side of a flat bed without using bedrails?: None Help needed moving to and from a bed to a chair (including a wheelchair)?: A Little Help needed standing up from a chair using your arms (e.g., wheelchair or bedside chair)?: A Little Help needed to walk in hospital room?: A Lot Help needed climbing 3-5 steps with a railing? : Total 6 Click Score: 17    End of Session Equipment Utilized During Treatment: Gait belt Activity Tolerance: Patient tolerated treatment well Patient left: in bed;with call bell/phone within reach;with family/visitor present Nurse Communication: Mobility status PT Visit Diagnosis: Unsteadiness on feet (R26.81);Muscle weakness (generalized) (M62.81);Difficulty in walking, not elsewhere classified (R26.2);Dizziness and giddiness (R42)     Time: 4098-1191 PT Time Calculation (min) (ACUTE ONLY): 40 min  Charges:    $Gait Training: 23-37 mins $Therapeutic Activity: 8-22 mins PT General Charges $$ ACUTE PT VISIT: 1 Visit                     Louis Butler, PT  Acute Rehabilitation Services Office [phone] Secure Chat welcomed    Louis Butler 05/25/2023, 1:44 PM

## 2023-05-25 NOTE — Progress Notes (Signed)
     Daily Progress Note Intern Pager: [phone]  Patient name: Louis Butler Medical record number: [id number] Date of birth: 1955-09-12 Age: 68 y.o. Gender: male  Primary Care Provider: Latrelle Dodrill, MD Consultants: Nephrology Code Status: Full Code   Pt Overview and Major Events to Date:  1/22: admitted to ICU for CRRT 1/26: stable from ICU, FMTS took over care   Assessment and Plan: Patient is a 68 yo M with PMHx of HTN, DMII, ESRD on HD admitted to ICU iniitally for worsening ESRD requiring CRRT. Patient is now medically stable and awaiting DME to discharge home.   Assessment & Plan Orthostatic hypotension Stable overnight.  Patient weak after dialysis. - Midodrine 10mg  TID  - Midodrine 10 mg TID during dialysis  - Continue  Fludrocortisone 0.2 mg daily  - Target MAP >60  - Abdominal binders, compression stockings - CTM vitals  - Orthostatics daily  Chronic and Stable Problems:  Depressed mood: continue sertraline 50 mg daily  HTN: Holding home medications in setting of orthostasis GERD: Protonix 40 mg daily T2DM: A1c 6.8 Cancer of the skin, ear, & external auditory canal: Stable, continue scheduled Tylenol Anemia: Stable, transfusion threshold less than 7, monitor CBC on dialysis days   FEN/GI: Fluid restriction <1200 mL PPx: subq heparin  Dispo: home with HHPT and DME  Subjective:  No acute complaints today.   Objective: Temp:  [98 F (36.7 C)-98.7 F (37.1 C)] 98.6 F (37 C) (02/17 0842) Pulse Rate:  [67-78] 73 (02/17 0842) Resp:  [17-19] 18 (02/17 0842) BP: (122-143)/(56-78) 126/56 (02/17 0842) SpO2:  [95 %-98 %] 97 % (02/17 0842) Weight:  [84 kg] 84 kg (02/17 0500) Physical Exam: General: generally well appearing, NAD Cardiovascular: RRR, no m/r/g Respiratory: NWOB on RA Abdomen: soft, NTND  Laboratory: Most recent CBC Lab Results  Component Value Date   WBC 6.5 05/22/2023   HGB 9.1 (L) 05/22/2023   HCT 27.9 (L) 05/22/2023   MCV  100.0 05/22/2023   PLT 195 05/22/2023   Most recent BMP    Latest Ref Rng & Units 05/25/2023    8:14 AM  BMP  Glucose 70 - 99 mg/dL 147   BUN 8 - 23 mg/dL 78   Creatinine 8.29 - 1.24 mg/dL 5.62   Sodium 130 - 865 mmol/L 133   Potassium 3.5 - 5.1 mmol/L 4.7   Chloride 98 - 111 mmol/L 94   CO2 22 - 32 mmol/L 24   Calcium 8.9 - 10.3 mg/dL 8.4     Imaging/Diagnostic Tests: No new imaging  Penne Lash, MD 05/25/2023, 9:28 AM  PGY-1, [facility] FPTS Intern pager: [phone], text pages welcome Secure chat group [facility] Teaching Service

## 2023-05-25 NOTE — TOC Progression Note (Signed)
Transition of Care [facility]) - Progression Note    Patient Details  Name: Louis Butler MRN: [id number] Date of Birth: 1955/05/20  Transition of Care [facility]) CM/SW Contact  Tom-Johnson, Hershal Coria, RN Phone Number: 05/25/2023, 11:13 AM  Clinical Narrative:     CM informed by [facility] Liaison that patient's Insurance Auth is approved for [facility]. Awaiting bed availability at this time.   CM will continue to follow.           Expected Discharge Plan: Skilled Nursing Facility Barriers to Discharge: Continued Medical Work up  Expected Discharge Plan and Services   Discharge Planning Services: CM Consult Post Acute Care Choice: Home Health Living arrangements for the past 2 months: Single Family Home Expected Discharge Date: 05/11/23               DME Arranged: Hospital bed DME Agency: [employer] Date DME Agency Contacted: 05/04/23 Time DME Agency Contacted: [phone] Representative spoke with at DME Agency: Earna Coder HH Arranged: PT, Nurse's Aide HH Agency: [facility] Date [facility] Agency Contacted: 05/04/23 Time HH Agency Contacted: [phone] Representative spoke with at [facility] Agency: Amy   Social Determinants of Health (SDOH) Interventions SDOH Screenings   Food Insecurity: No Food Insecurity (05/08/2023)  Housing: Low Risk  (05/08/2023)  Transportation Needs: No Transportation Needs (05/08/2023)  Utilities: Not At Risk (05/08/2023)  Alcohol Screen: Low Risk  (12/01/2022)  Depression (PHQ2-9): Medium Risk (12/24/2022)  Financial Resource Strain: Low Risk  (12/01/2022)  Physical Activity: Insufficiently Active (12/01/2022)  Social Connections: Moderately Integrated (05/08/2023)  Stress: Stress Concern Present (12/01/2022)  Tobacco Use: Medium Risk (04/29/2023)  Health Literacy: Adequate Health Literacy (12/01/2022)    Readmission Risk Interventions    05/04/2023    4:35 PM  Readmission Risk Prevention Plan  Transportation Screening Complete  HRI or Home Care Consult  Complete  Social Work Consult for Recovery Care Planning/Counseling Complete  Palliative Care Screening Not Applicable  Medication Review Oceanographer) Referral to Pharmacy

## 2023-05-25 NOTE — Plan of Care (Signed)

## 2023-05-25 NOTE — Progress Notes (Signed)
   05/25/23 1730  Vitals  Temp 97.8 F (36.6 C)  Pulse Rate 60  Resp 18  BP (!) 116/51  SpO2 98 %  Post Treatment  Dialyzer Clearance Lightly streaked  Hemodialysis Intake (mL) 0 mL  Liters Processed 68.1  Fluid Removed (mL) 1500 mL  Tolerated HD Treatment Yes  AVG/AVF Arterial Site Held (minutes) 6 minutes  AVG/AVF Venous Site Held (minutes) 6 minutes   Received patient in bed to unit.  Alert and oriented.  Informed consent signed and in chart.   TX duration: 3hrs  Patient tolerated well.  Transported back to the room  Alert, without acute distress.  Hand-off given to patient's nurse.   Access used: LAVF Access issues: none  Total UF removed: 1.5L Medication(s) given: none    Na'[facility]aminy T [facility]

## 2023-05-26 LAB — RENAL FUNCTION PANEL
Albumin: 3.1 g/dL — ABNORMAL LOW (ref 3.5–5.0)
Anion gap: 16 — ABNORMAL HIGH (ref 5–15)
BUN: 50 mg/dL — ABNORMAL HIGH (ref 8–23)
CO2: 24 mmol/L (ref 22–32)
Calcium: 8.5 mg/dL — ABNORMAL LOW (ref 8.9–10.3)
Chloride: 94 mmol/L — ABNORMAL LOW (ref 98–111)
Creatinine, Ser: 4.82 mg/dL — ABNORMAL HIGH (ref 0.61–1.24)
GFR, Estimated: 12 mL/min — ABNORMAL LOW (ref >60)
Glucose, Bld: 139 mg/dL — ABNORMAL HIGH (ref 70–99)
Phosphorus: 4.1 mg/dL (ref 2.5–4.6)
Potassium: 3.6 mmol/L (ref 3.5–5.1)
Sodium: 134 mmol/L — ABNORMAL LOW (ref 135–145)

## 2023-05-26 NOTE — Progress Notes (Addendum)
Physical Therapy Treatment Patient Details Name: Louis Butler MRN: [id number] DOB: 15-Dec-1955 Today's Date: 05/26/2023   History of Present Illness 68 y.o. male presented 1/22 with N/V/abd pain and was found to have marked hyperkalemia and renal failure. CRRT 1/23-1/24. 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 Ricard Dillon), DM, HTN, recent squamous cell ca H&N (surgery, XRT 2024), anemia.    PT Comments  Continuing work on functional mobility and activity tolerance;  Yesterday, we walked in the halway with rollator (to sit at the first sign of ANY presyncope), and used self-monitor for symptoms to inform when to sit; today (non-HD day),  opted to take a few more BPs to get some numbers to look at, with details as follows:   05/26/23 1514 05/26/23 1515 05/26/23 1516  Orthostatic Lying   BP- Lying 137/63  --   --   Pulse- Lying 67  --   --   Orthostatic Sitting  BP- Sitting  --  129/65  --   Pulse- Sitting  --  84  --   Orthostatic Standing at 0 minutes  BP- Standing at 0 minutes  --   --  (!) 81/56  Pulse- Standing at 0 minutes  --   --  103 (taken immediately after standing)  Orthostatic Standing at 3 minutes  BP- Standing at 3 minutes  --   --   --   Pulse- Standing at 3 minutes  --   --  (!) 1    05/26/23 1517 05/26/23 1518 05/26/23 1519  Orthostatic Lying   BP- Lying  --   --   --   Pulse- Lying  --   --   --   Orthostatic Sitting  BP- Sitting  --  115/62 117/74  Pulse- Sitting  --  90 (seated immediately after first bout of hallway amb) 86 (seated 5 minutes after first bout of amb)  Orthostatic Standing at 0 minutes  BP- Standing at 0 minutes  --   --   --   Pulse- Standing at 0 minutes  --   --   --   Orthostatic Standing at 3 minutes  BP- Standing at 3 minutes (!) 77/51  --   --   Pulse- Standing at 3 minutes 110 (stnading after hallway amb)  --   --     05/26/23 1520 05/26/23 1525  Orthostatic Lying   BP- Lying  --   --   Pulse- Lying  --   --   Orthostatic Sitting  BP- Sitting  --   129/67  Pulse- Sitting  --  102 (seated EOB)  Orthostatic Standing at 0 minutes  BP- Standing at 0 minutes  --   --   Pulse- Standing at 0 minutes  --   --   Orthostatic Standing at 3 minutes  BP- Standing at 3 minutes (!) 86/50  --   Pulse- Standing at 3 minutes 114 (standing after walking the hallways)  --    Used thigh high Ted hose, abdominal binder, and ace wraps to knees   If plan is discharge home, recommend the following: A little help with walking and/or transfers;A little help with bathing/dressing/bathroom;Assistance with cooking/housework;Assist for transportation;Help with stairs or ramp for entrance   Can travel by private vehicle     Yes (consider leaning seat back)  Equipment Recommendations  Rollator (4 wheels)    Recommendations for Other Services       Precautions / Restrictions Precautions Precautions: Fall Recall of Precautions/Restrictions: Intact Precaution/Restrictions Comments:  Monitor BP. Orthostatic - has abd binder, ace wraps, and thigh high compression stockings Restrictions Weight Bearing Restrictions Per Provider Order: No     Mobility  Bed Mobility Overal bed mobility: Needs Assistance Bed Mobility: Supine to Sit     Supine to sit: Supervision     General bed mobility comments: Supervision for safety, extra time, no physical assist needed    Transfers Overall transfer level: Needs assistance Equipment used: Rollator (4 wheels) Transfers: Sit to/from Stand Sit to Stand: Supervision           General transfer comment: cues for hand placement, education on safety features (brakes) of rollator. BP does drop but pt stable    Ambulation/Gait Ambulation/Gait assistance: Contact guard assist Gait Distance (Feet): 80 Feet (Out and back with one seated rest break) Assistive device: Rollator (4 wheels) Gait Pattern/deviations: Step-through pattern, Decreased stride length, Trunk flexed       General Gait Details: pt completed  hallway amb  with rollator, improved activity tolerance, with decr syncopal symptomatology with legs wrapped. pt cued for self monitoring for need for seated rest   Stairs             Wheelchair Mobility     Tilt Bed    Modified Rankin (Stroke Patients Only)       Balance     Sitting balance-Leahy Scale: Good       Standing balance-Leahy Scale: Fair                              Hotel manager: Impaired Factors Affecting Communication: Hearing impaired (hears better out of R ear)  Cognition Arousal: Alert Behavior During Therapy: WFL for tasks assessed/performed   PT - Cognitive impairments: History of cognitive impairments                       PT - Cognition Comments: Seems to be at baseline, slow to respond at times but could be due to [facility] playfully to [employer] references Following commands: Intact      Cueing Cueing Techniques: Verbal cues, Gestural cues  Exercises      General Comments General comments (skin integrity, edema, etc.): Incr time to don thigh high Ted hose; wife present and supportive      Pertinent Vitals/Pain Pain Assessment Pain Assessment: No/denies pain Pain Intervention(s): Monitored during session    Home Living                          Prior Function            PT Goals (current goals can now be found in the care plan section) Acute Rehab PT Goals Patient Stated Goal: to be able to leave his house for "fun" activity PT Goal Formulation: With patient Time For Goal Achievement: 06/04/23 Potential to Achieve Goals: Fair Progress towards PT goals: Progressing toward goals    Frequency    Min 1X/week      PT Plan      Co-evaluation              AM-PAC PT "6 Clicks" Mobility   Outcome Measure  Help needed turning from your back to your side while in a flat bed without using bedrails?: None Help needed moving from lying on your back to  sitting on the side of a flat bed without using bedrails?: None Help needed moving  to and from a bed to a chair (including a wheelchair)?: A Little Help needed standing up from a chair using your arms (e.g., wheelchair or bedside chair)?: A Little Help needed to walk in hospital room?: A Lot Help needed climbing 3-5 steps with a railing? : Total 6 Click Score: 17    End of Session Equipment Utilized During Treatment: Gait belt Activity Tolerance: Patient tolerated treatment well Patient left: in bed;with call bell/phone within reach;with family/visitor present Nurse Communication: Mobility status PT Visit Diagnosis: Unsteadiness on feet (R26.81);Muscle weakness (generalized) (M62.81);Difficulty in walking, not elsewhere classified (R26.2);Dizziness and giddiness (R42)     Time: 1610-9604 PT Time Calculation (min) (ACUTE ONLY): 44 min  Charges:    $Gait Training: 23-37 mins $Therapeutic Activity: 8-22 mins PT General Charges $$ ACUTE PT VISIT: 1 Visit                     Van Clines, PT  Acute Rehabilitation Services Office [phone] Secure Chat welcomed    [name] 05/26/2023, 4:45 PM

## 2023-05-26 NOTE — Plan of Care (Signed)
   Problem: Fluid Volume: Goal: Ability to maintain a balanced intake and output will improve Outcome: Progressing   Problem: Metabolic: Goal: Ability to maintain appropriate glucose levels will improve Outcome: Progressing   Problem: Nutritional: Goal: Maintenance of adequate nutrition will improve Outcome: Progressing

## 2023-05-26 NOTE — Progress Notes (Signed)
Reinforced patient education about calling for assistance to get to the bathroom.  He went to the bathroom without staff before PT worked with him.  His wife said she assisted him. Patient said that he will not call for assistance to get to the bathroom.  He stated' " They don't do anything but look at me anyway".  He refused to commit to calling for assistance to the bathroom.

## 2023-05-26 NOTE — Progress Notes (Signed)
Patient ID: Louis Butler, male   DOB: [birth year], 68 y.o.   MRN: [id number] [facility] Progress Note   Assessment/ Plan:   1.  Deconditioning status post hospitalization: Continue with plans for rehab as able.  Orthostatic blood pressures on treatment with midodrine and now florinef.   2. ESRD: Continue hemodialysis on MWF schedule. [facility] to resume dialysis after discharge.  3. Anemia: Low but relatively stable hemoglobin and hematocrit.  Holding off on ESA with recent head/neck cancer. 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 Nutrition: Continue protein/nutritional supplementation.  Taking in large levels of salt.  Will change to heart healthy diet 6. Hypotension: ACTH stimulation test negative for adrenal insufficiency.  Midodrine dose decreased and empirically started on florinef with recurrent orthostatic drops. Droxidopa cost prohibitive.   Subjective:   Patient has no complaints today.  Had some low blood pressures on dialysis which is a chronic problem.   Objective:   BP (!) 92/52 (BP Location: Right Arm)   Pulse 76   Temp 98.1 F (36.7 C) (Oral)   Resp 20   Ht 6\' 3"  (1.905 m)   Wt 84 kg   SpO2 100%   BMI 23.15 kg/m   Physical Exam: Gen: Lying in bed in no distress CVS: Normal rate, no rub Resp: Chest rise no increased work of breathing Abd: Soft, flat, nontender, bowel sounds normal Ext: No lower extremity edema.  Left upper arm AV fistula   Labs: BMET Recent Labs  Lab 05/20/23 0806 05/21/23 0747 05/22/23 0727 05/23/23 0313 05/24/23 0848 05/25/23 0814 05/26/23 0808  NA 133* 134* 133* 131* 132* 133* 134*  K 4.7 4.7 4.5 3.6 4.1 4.7 3.6  CL 93* 94* 95* 91* 93* 94* 94*  CO2 23 29 27 27 26 24 24   GLUCOSE 126* 93 100* 130* 133* 148* 139*  BUN 61* 46* 66* 38* 56* 78* 50*  CREATININE 5.99* 4.99* 6.16* 3.76* 5.48* 6.87* 4.82*  CALCIUM 8.7* 8.8* 8.4* 8.4* 8.3* 8.4* 8.5*  PHOS 4.3 3.5 4.4  2.8 4.7* 5.2* 4.1   CBC Recent Labs  Lab 05/22/23 0727  WBC 6.5  HGB 9.1*  HCT 27.9*  MCV 100.0  PLT 195      Medications:     cholecalciferol  5,000 Units Oral Once per day on Monday Thursday   fludrocortisone  0.2 mg Oral Daily   gabapentin  300 mg Oral QHS   heparin injection (subcutaneous)  5,000 Units Subcutaneous Q8H   midodrine  10 mg Oral TID WC   midodrine  10 mg Oral Q M,W,F-HD   multivitamin  1 tablet Oral QHS   pantoprazole  40 mg Oral Daily   sertraline  50 mg Oral Daily   tamsulosin  0.4 mg Oral Daily    Darnell Level  05/26/2023, 10:33 AM

## 2023-05-26 NOTE — Progress Notes (Signed)
     Daily Progress Note Intern Pager: [phone]  Patient name: Louis Butler Medical record number: [id number] Date of birth: 19-Aug-1955 Age: 68 y.o. Gender: male  Primary Care Provider: Latrelle Dodrill, MD Consultants: Nephrology Code Status: Full Code   Pt Overview and Major Events to Date:  1/22: admitted to ICU for CRRT 1/26: stable from ICU, FMTS took over care   Assessment and Plan: Patient is a 68 yo M with PMHx of HTN, DMII, ESRD on HD admitted to ICU iniitally for worsening ESRD requiring CRRT. Patient is now medically stable and awaiting DME to discharge home.   Assessment & Plan Orthostatic hypotension Stable overnight.  Patient weak after dialysis. - Midodrine 10mg  TID  - Midodrine 10 mg TID during dialysis  - Continue  Fludrocortisone 0.2 mg daily  - Target MAP >60  - Abdominal binders, compression stockings - CTM vitals  - Orthostatics daily  Chronic and Stable Problems:  Depressed mood: continue sertraline 50 mg daily  HTN: Holding home medications in setting of orthostasis GERD: Protonix 40 mg daily T2DM: A1c 6.8 Cancer of the skin, ear, & external auditory canal: Stable, continue scheduled Tylenol Anemia: Stable, transfusion threshold less than 7, monitor CBC on dialysis days   FEN/GI: Fluid restriction <1200 mL PPx: subq heparin  Dispo: home with HHPT and DME  Subjective:  No acute complaints today.   Objective: Temp:  [97.6 F (36.4 C)-98.1 F (36.7 C)] 98.1 F (36.7 C) (02/18 0800) Pulse Rate:  [60-78] 76 (02/18 0800) Resp:  [14-22] 20 (02/18 0800) BP: (77-128)/(43-64) 92/52 (02/18 0800) SpO2:  [96 %-100 %] 100 % (02/18 0800) Physical Exam: General: in bed, NAD Cardiovascular: RRR, no m/r/g Respiratory: NWOB on RA Abdomen: soft, NTND  Laboratory: Most recent CBC Lab Results  Component Value Date   WBC 6.5 05/22/2023   HGB 9.1 (L) 05/22/2023   HCT 27.9 (L) 05/22/2023   MCV 100.0 05/22/2023   PLT 195 05/22/2023   Most  recent BMP    Latest Ref Rng & Units 05/26/2023    8:08 AM  BMP  Glucose 70 - 99 mg/dL 308   BUN 8 - 23 mg/dL 50   Creatinine 6.57 - 1.24 mg/dL 8.46   Sodium 962 - 952 mmol/L 134   Potassium 3.5 - 5.1 mmol/L 3.6   Chloride 98 - 111 mmol/L 94   CO2 22 - 32 mmol/L 24   Calcium 8.9 - 10.3 mg/dL 8.5     Imaging/Diagnostic Tests: No new imaging  Penne Lash, MD 05/26/2023, 11:58 AM  PGY-1, [facility] FPTS Intern pager: [phone], text pages welcome Secure chat group [facility] Teaching Service

## 2023-05-26 NOTE — Assessment & Plan Note (Signed)
Stable overnight.  Patient weak after dialysis. - Midodrine 10mg  TID  - Midodrine 10 mg TID during dialysis  - Continue  Fludrocortisone 0.2 mg daily  - Target MAP >60  - Abdominal binders, compression stockings - CTM vitals  - Orthostatics daily

## 2023-05-27 DIAGNOSIS — N186 End stage renal disease: Secondary | ICD-10-CM | POA: Diagnosis not present

## 2023-05-27 LAB — RENAL FUNCTION PANEL
Albumin: 2.8 g/dL — ABNORMAL LOW (ref 3.5–5.0)
Anion gap: 14 (ref 5–15)
BUN: 71 mg/dL — ABNORMAL HIGH (ref 8–23)
CO2: 23 mmol/L (ref 22–32)
Calcium: 8.2 mg/dL — ABNORMAL LOW (ref 8.9–10.3)
Chloride: 95 mmol/L — ABNORMAL LOW (ref 98–111)
Creatinine, Ser: 5.65 mg/dL — ABNORMAL HIGH (ref 0.61–1.24)
GFR, Estimated: 10 mL/min — ABNORMAL LOW (ref >60)
Glucose, Bld: 153 mg/dL — ABNORMAL HIGH (ref 70–99)
Phosphorus: 4.5 mg/dL (ref 2.5–4.6)
Potassium: 3.7 mmol/L (ref 3.5–5.1)
Sodium: 132 mmol/L — ABNORMAL LOW (ref 135–145)

## 2023-05-27 LAB — CBC
HCT: 26.4 % — ABNORMAL LOW (ref 39.0–52.0)
Hemoglobin: 8.5 g/dL — ABNORMAL LOW (ref 13.0–17.0)
MCH: 32 pg (ref 26.0–34.0)
MCHC: 32.2 g/dL (ref 30.0–36.0)
MCV: 99.2 fL (ref 80.0–100.0)
Platelets: 202 10*3/uL (ref 150–400)
RBC: 2.66 MIL/uL — ABNORMAL LOW (ref 4.22–5.81)
RDW: 18.8 % — ABNORMAL HIGH (ref 11.5–15.5)
WBC: 7.3 10*3/uL (ref 4.0–10.5)
nRBC: 0 % (ref 0.0–0.2)

## 2023-05-27 NOTE — Progress Notes (Signed)
Patient ID: Louis Butler, male   DOB: [birth year], 68 y.o.   MRN: [id number] [facility] Progress Note   Assessment/ Plan:   1.  Deconditioning status post hospitalization: Continue with plans for rehab as able.  Orthostatic blood pressures on treatment with midodrine and now florinef.   2. ESRD: Continue hemodialysis on MWF schedule. [facility] to resume dialysis after discharge.  3. Anemia: Low but relatively stable hemoglobin and hematocrit.  Holding off on ESA with recent head/neck cancer. 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 Nutrition: Continue protein/nutritional supplementation.  Taking in large levels of salt.  Will change to heart healthy diet 6. Hypotension: ACTH stimulation test negative for adrenal insufficiency.  Midodrine dose decreased and empirically started on florinef with recurrent orthostatic drops. Droxidopa cost prohibitive.   Subjective:   Patient is on dialysis with no complaints   Objective:   BP 122/64   Pulse 63   Temp 97.8 F (36.6 C)   Resp 14   Ht 6\' 3"  (1.905 m)   Wt 82.2 kg   SpO2 94%   BMI 22.65 kg/m   Physical Exam: Gen: Lying in bed in no distress CVS: Normal rate, no rub Resp: Bilateral chest rise no increased work of breathing Abd: Soft, flat, nontender, bowel sounds normal Ext: No lower extremity edema.  Left upper arm AV fistula   Labs: BMET Recent Labs  Lab 05/21/23 0747 05/22/23 0727 05/23/23 0313 05/24/23 0848 05/25/23 0814 05/26/23 0808 05/27/23 0714  NA 134* 133* 131* 132* 133* 134* 132*  K 4.7 4.5 3.6 4.1 4.7 3.6 3.7  CL 94* 95* 91* 93* 94* 94* 95*  CO2 29 27 27 26 24 24 23   GLUCOSE 93 100* 130* 133* 148* 139* 153*  BUN 46* 66* 38* 56* 78* 50* 71*  CREATININE 4.99* 6.16* 3.76* 5.48* 6.87* 4.82* 5.65*  CALCIUM 8.8* 8.4* 8.4* 8.3* 8.4* 8.5* 8.2*  PHOS 3.5 4.4 2.8 4.7* 5.2* 4.1 4.5   CBC Recent Labs  Lab 05/22/23 0727 05/27/23 0714  WBC 6.5  7.3  HGB 9.1* 8.5*  HCT 27.9* 26.4*  MCV 100.0 99.2  PLT 195 202      Medications:     cholecalciferol  5,000 Units Oral Once per day on Monday Thursday   fludrocortisone  0.2 mg Oral Daily   gabapentin  300 mg Oral QHS   heparin injection (subcutaneous)  5,000 Units Subcutaneous Q8H   midodrine  10 mg Oral TID WC   midodrine  10 mg Oral Q M,W,F-HD   multivitamin  1 tablet Oral QHS   pantoprazole  40 mg Oral Daily   sertraline  50 mg Oral Daily   tamsulosin  0.4 mg Oral Daily    Darnell Level  05/27/2023, 11:14 AM

## 2023-05-27 NOTE — Progress Notes (Addendum)
OT Cancellation Note  Patient Details Name: Louis Butler MRN: [id number] DOB: [birth year]   Cancelled Treatment:    Reason Eval/Treat Not Completed: Patient at procedure or test/ unavailable (off unit at HD)  1302: Pt still at HD.  [name], OTR/L SecureChat Preferred Acute Rehab [phone]   Dalphine Handing 05/27/2023, 8:41 AM

## 2023-05-27 NOTE — Progress Notes (Signed)
     Daily Progress Note Intern Pager: [phone]  Patient name: Louis Butler Medical record number: [id number] Date of birth: 08-Aug-1955 Age: 68 y.o. Gender: male  Primary Care Provider: Latrelle Dodrill, MD Consultants: Nephrology Code Status: Full code  Pt Overview and Major Events to Date:  1/22: admitted to ICU for CRRT 1/26: stable from ICU, FMTS took over care  2/17: insurance approved LTACH  Assessment and Plan: Patient is a 68 year old male with past medical history of hypertension, diabetes, ESRD on HD initially admitted to the ICU for worsening ESRD requiring CRRT.  Patient is now medically stable and awaiting placement at [facility]. Assessment & Plan Orthostatic hypotension Stable overnight.  Patient weak after dialysis. - Midodrine 10mg  TID  - Midodrine 10 mg TID during dialysis  - Continue  Fludrocortisone 0.2 mg daily  - Target MAP >60  - Abdominal binders, compression stockings - CTM vitals  - Orthostatics daily   Chronic and Stable Issues: ESRD: Hemodialysis Monday Wednesday Friday schedule Depressed mood: continue sertraline 50 mg daily  HTN: Holding home medications in setting of orthostasis GERD: Protonix 40 mg daily T2DM: A1c 6.8 Cancer of the skin, ear, & external auditory canal: Stable, continue scheduled Tylenol Anemia: Stable, transfusion threshold less than 7, monitor CBC on dialysis days    FEN/GI: Heart healthy with 1500 mL fluid restriction PPx: Subq heparin Dispo: LTAC pending available bed  Subjective:  NAD, in HD   Objective: Temp:  [97.5 F (36.4 C)-98.6 F (37 C)] 98.6 F (37 C) (02/19 0424) Pulse Rate:  [65-76] 72 (02/19 0424) Resp:  [18-20] 20 (02/19 0424) BP: (92-136)/(52-55) 121/55 (02/19 0424) SpO2:  [95 %-100 %] 95 % (02/19 0424) Physical Exam: General: No acute distress Cardiovascular: Regular rate and rhythm Respiratory: Normal work of breathing on room air Abdomen: Soft nontender nondistended   Laboratory: Most  recent CBC Lab Results  Component Value Date   WBC 6.5 05/22/2023   HGB 9.1 (L) 05/22/2023   HCT 27.9 (L) 05/22/2023   MCV 100.0 05/22/2023   PLT 195 05/22/2023   Most recent BMP    Latest Ref Rng & Units 05/26/2023    8:08 AM  BMP  Glucose 70 - 99 mg/dL 875   BUN 8 - 23 mg/dL 50   Creatinine 6.43 - 1.24 mg/dL 3.29   Sodium 518 - 841 mmol/L 134   Potassium 3.5 - 5.1 mmol/L 3.6   Chloride 98 - 111 mmol/L 94   CO2 22 - 32 mmol/L 24   Calcium 8.9 - 10.3 mg/dL 8.5     Imaging/Diagnostic Tests: No new imaging Penne Lash, MD 05/27/2023, 7:35 AM  PGY-1, [facility] FPTS Intern pager: [phone], text pages welcome Secure chat group [facility] Teaching Service

## 2023-05-27 NOTE — Progress Notes (Signed)
Pt off floor taken to dialysis

## 2023-05-27 NOTE — Progress Notes (Signed)
   05/27/23 1239  Vitals  Temp 98.1 F (36.7 C)  Pulse Rate 72  Resp 15  BP 139/62  SpO2 100 %  O2 Device Room Air  Weight 81 kg  Type of Weight Post-Dialysis  Oxygen Therapy  Patient Activity (if Appropriate) In bed  Pulse Oximetry Type Continuous  Oximetry Probe Site Changed No  Post Treatment  Dialyzer Clearance Lightly streaked  Liters Processed 89.6  Fluid Removed (mL) 1200 mL  Tolerated HD Treatment Yes  AVG/AVF Arterial Site Held (minutes) 5 minutes  AVG/AVF Venous Site Held (minutes) 5 minutes   Received patient in bed to unit.  Alert and oriented.  Informed consent signed and in chart.   TX duration:3.75hrs  Patient tolerated well.  Transported back to the room  Alert, without acute distress.  Hand-off given to patient's nurse.   Access used: LAVF Access issues: none  Total UF removed: 1.2L Medication(s) given: none   Na'Shaminy T Kh[facility]

## 2023-05-27 NOTE — Assessment & Plan Note (Signed)
Stable overnight.  Patient weak after dialysis. - Midodrine 10mg  TID  - Midodrine 10 mg TID during dialysis  - Continue  Fludrocortisone 0.2 mg daily  - Target MAP >60  - Abdominal binders, compression stockings - CTM vitals  - Orthostatics daily

## 2023-05-27 NOTE — Plan of Care (Signed)
  Problem: Fluid Volume: Goal: Ability to maintain a balanced intake and output will improve Outcome: Progressing   Problem: Health Behavior/Discharge Planning: Goal: Ability to identify and utilize available resources and services will improve Outcome: Progressing   Problem: Nutritional: Goal: Maintenance of adequate nutrition will improve Outcome: Progressing   Problem: Skin Integrity: Goal: Risk for impaired skin integrity will decrease Outcome: Progressing

## 2023-05-27 NOTE — Procedures (Signed)
I was present at this dialysis session. I have reviewed the session itself and made appropriate changes.   Filed Weights   05/20/23 0834 05/25/23 0500 05/27/23 0759  Weight: 84 kg 84 kg 82.2 kg    Recent Labs  Lab 05/27/23 0714  NA 132*  K 3.7  CL 95*  CO2 23  GLUCOSE 153*  BUN 71*  CREATININE 5.65*  CALCIUM 8.2*  PHOS 4.5    Recent Labs  Lab 05/22/23 0727 05/27/23 0714  WBC 6.5 7.3  HGB 9.1* 8.5*  HCT 27.9* 26.4*  MCV 100.0 99.2  PLT 195 202    Scheduled Meds:  cholecalciferol  5,000 Units Oral Once per day on Monday Thursday   fludrocortisone  0.2 mg Oral Daily   gabapentin  300 mg Oral QHS   heparin injection (subcutaneous)  5,000 Units Subcutaneous Q8H   midodrine  10 mg Oral TID WC   midodrine  10 mg Oral Q M,W,F-HD   multivitamin  1 tablet Oral QHS   pantoprazole  40 mg Oral Daily   sertraline  50 mg Oral Daily   tamsulosin  0.4 mg Oral Daily   Continuous Infusions: PRN Meds:.acetaminophen, camphor-menthol, hydrOXYzine, lidocaine-prilocaine, loperamide, ondansetron, polyethylene glycol   Louie Bun,  MD 05/27/2023, 11:13 AM

## 2023-05-27 NOTE — Plan of Care (Signed)
  Problem: Fluid Volume: Goal: Ability to maintain a balanced intake and output will improve Outcome: Completed/Met

## 2023-05-28 DIAGNOSIS — I951 Orthostatic hypotension: Secondary | ICD-10-CM | POA: Diagnosis not present

## 2023-05-28 DIAGNOSIS — N186 End stage renal disease: Secondary | ICD-10-CM | POA: Diagnosis not present

## 2023-05-28 LAB — RENAL FUNCTION PANEL
Albumin: 3 g/dL — ABNORMAL LOW (ref 3.5–5.0)
Anion gap: 12 (ref 5–15)
BUN: 44 mg/dL — ABNORMAL HIGH (ref 8–23)
CO2: 27 mmol/L (ref 22–32)
Calcium: 8.7 mg/dL — ABNORMAL LOW (ref 8.9–10.3)
Chloride: 92 mmol/L — ABNORMAL LOW (ref 98–111)
Creatinine, Ser: 4.25 mg/dL — ABNORMAL HIGH (ref 0.61–1.24)
GFR, Estimated: 15 mL/min — ABNORMAL LOW (ref >60)
Glucose, Bld: 134 mg/dL — ABNORMAL HIGH (ref 70–99)
Phosphorus: 3.8 mg/dL (ref 2.5–4.6)
Potassium: 4.1 mmol/L (ref 3.5–5.1)
Sodium: 131 mmol/L — ABNORMAL LOW (ref 135–145)

## 2023-05-28 NOTE — Assessment & Plan Note (Signed)
No acute events overnight. Patient  dialysis. - Midodrine 10mg  TID  - Midodrine 10 mg TID during dialysis  - Continue  Fludrocortisone 0.2 mg daily  - Target MAP >60  - Abdominal binders, compression stockings - CTM vitals  - Orthostatics daily

## 2023-05-28 NOTE — Progress Notes (Signed)
OT Cancellation Note  Patient Details Name: Louis Butler MRN: [id number] DOB: 09/27/1955   Cancelled Treatment:    Reason Eval/Treat Not Completed: Patient declined, no reason specified. Pt refusing ADL training with OT. Encouraged showering seated on 3 in 1, pt stated "I don't need anyone mothering me, if I take a shower, I will do it myself." Pt declining mobility opting to rest as it is an non-HD day.   Evern Bio 05/28/2023, 9:12 AM Berna Spare, OTR/L Acute Rehabilitation Services Office: [phone]

## 2023-05-28 NOTE — Progress Notes (Addendum)
Nutrition Follow-up  DOCUMENTATION CODES:   Non-severe (moderate) malnutrition in context of chronic illness  INTERVENTION:  Encourage adequate PO intake; double protein portions with meals Renal MVI with minerals daily Liberalized diet from carb modified    NUTRITION DIAGNOSIS:  Moderate Malnutrition related to chronic illness (renal disease) as evidenced by moderate fat depletion, mild muscle depletion, moderate muscle depletion, percent weight loss (16% weight loss within 6 months). - remains applicable   GOAL:  Patient will meet greater than or equal to 90% of their needs - ongoing  MONITOR:  PO intake, Supplement acceptance, Skin  REASON FOR ASSESSMENT:  Consult Assessment of nutrition requirement/status, Diet education  ASSESSMENT:  68 yo male admitted with acute on chronic renal failure. PMH includes ESRD-stopped HD 06/2020, DM-2, GERD, HTN.  Nephrology modifying diet to heart healthy d/t patient's level of salt intake. Carbohydrate controlled diet remains contraindicated based off patient's blood sugar trends and previous A1c while consuming regular diet. Medically stable  Appetite stable averaging 75% intake over last 4 days. Nutritionally stable. Also medically stable and awaiting d/c to LTAC.    Last dialysis tx on 2/19 with UF 1.2L. Non-pitting edema remains to BLEs, however greatly improved. UOP yesterday .  Admit Weight: 76.3kg Current Weight: 81kg  Net IO Since Admission: -22,874.87 mL [05/28/23 1000]   Remains on vitamin D3 and renal vitamin. No binders currently ordered with PHOS within range. Na+ likely r/t volume overload. Correcting with iHD. Fluid restriction of 1544mL/day in place.  Meds: cholecalciferol, renal MVI, pantoprazole  Labs:  Na+ 134>133>131 (L) K+ 4.1 (wdl) CBGs 134-153 over 48 hours A1c 6.8 (04/2023)  Diet Order:   Diet Order             Diet Heart Room service appropriate? Yes; Fluid consistency: Thin; Fluid restriction: 1500  mL Fluid  Diet effective now             EDUCATION NEEDS:  Education needs have been addressed  Skin:  Skin Assessment: Reviewed RN Assessment  Last BM:  2/19  Height:  Ht Readings from Last 1 Encounters:  04/29/23 6\' 3"  (1.905 m)   Weight:  Wt Readings from Last 1 Encounters:  05/27/23 81 kg   Ideal Body Weight:  89.1 kg  BMI:  Body mass index is 22.32 kg/m.  Estimated Nutritional Needs:   Kcal:  2200-2400  Protein:  110-120 gm  Fluid:  1 L + UOP  Myrtie Cruise MS, RD, LDN Registered Dietitian Clinical Nutrition RD Inpatient Contact Info in Amion

## 2023-05-28 NOTE — TOC Progression Note (Signed)
Transition of Care [facility]) - Progression Note    Patient Details  Name: Louis Butler MRN: [id number] Date of Birth: 1955/08/27  Transition of Care [facility]) CM/SW Contact  Tom-Johnson, Hershal Coria, RN Phone Number: 05/28/2023, 2:18 PM  Clinical Narrative:     Patient awaiting bed availability at [facility].  CM will continue to follow.          Expected Discharge Plan: Skilled Nursing Facility Barriers to Discharge: Continued Medical Work up  Expected Discharge Plan and Services   Discharge Planning Services: CM Consult Post Acute Care Choice: Home Health Living arrangements for the past 2 months: Single Family Home Expected Discharge Date: 05/11/23               DME Arranged: Hospital bed DME Agency: [employer] Date DME Agency Contacted: 05/04/23 Time DME Agency Contacted: [phone] Representative spoke with at DME Agency: Earna Coder HH Arranged: PT, Nurse's Aide HH Agency: [facility] Date [facility] Agency Contacted: 05/04/23 Time HH Agency Contacted: [phone] Representative spoke with at [facility] Agency: Amy   Social Determinants of Health (SDOH) Interventions SDOH Screenings   Food Insecurity: No Food Insecurity (05/08/2023)  Housing: Low Risk  (05/08/2023)  Transportation Needs: No Transportation Needs (05/08/2023)  Utilities: Not At Risk (05/08/2023)  Alcohol Screen: Low Risk  (12/01/2022)  Depression (PHQ2-9): Medium Risk (12/24/2022)  Financial Resource Strain: Low Risk  (12/01/2022)  Physical Activity: Insufficiently Active (12/01/2022)  Social Connections: Moderately Integrated (05/08/2023)  Stress: Stress Concern Present (12/01/2022)  Tobacco Use: Medium Risk (04/29/2023)  Health Literacy: Adequate Health Literacy (12/01/2022)    Readmission Risk Interventions    05/04/2023    4:35 PM  Readmission Risk Prevention Plan  Transportation Screening Complete  HRI or Home Care Consult Complete  Social Work Consult for Recovery Care Planning/Counseling Complete  Palliative Care  Screening Not Applicable  Medication Review Oceanographer) Referral to Pharmacy

## 2023-05-28 NOTE — Progress Notes (Signed)
Physical Therapy Treatment Patient Details Name: Louis Butler MRN: [id number] DOB: Apr 12, 1955 Today's Date: 05/28/2023   History of Present Illness 68 y.o. male presented 1/22 with N/V/abd pain and was found to have marked hyperkalemia and renal failure. CRRT 1/23-1/24. 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 Ricard Dillon), DM, HTN, recent squamous cell ca H&N (surgery, XRT 2024), anemia.    PT Comments  The pt was able to make good improvement in total activity tolerance this session despite continued orthostatic BP. He completed 15ft hallway ambulation with a rollator followed by seated rest before an additional 125 ft ambulation. Pt wore abdominal binder and bilateral thigh-high ted hose for mobility. Will continue to benefit from skilled PT to progress functional activity tolerance and endurance.   VITALS:  - supine in bed - BP: 141/62 (85); HR: 75bpm - sitting EOB - BP: 128/64 (82); HR: 87bpm - standing - BP: 86/55 (66); HR: 105bpm - standing after 3 min - BP: 73/48 (58); HR: 117bpm - sitting EOB - BP: 107/64 (77); HR: 101bpm - sitting post-ambulation (150 ft) - BP: 106/59 (74); HR: 109bpm - standing post-ambulation (155ft) - BP: 83/50 (61); HR: 120bpm - supine at end of session - BP: 121/58 (74); HR: 83bpm     If plan is discharge home, recommend the following: A little help with walking and/or transfers;A little help with bathing/dressing/bathroom;Assistance with cooking/housework;Assist for transportation;Help with stairs or ramp for entrance   Can travel by private vehicle     Yes (consider leaning seat back)  Equipment Recommendations  Rollator (4 wheels)    Recommendations for Other Services       Precautions / Restrictions Precautions Precautions: Fall Recall of Precautions/Restrictions: Intact Precaution/Restrictions Comments: Monitor BP. Orthostatic - has abd binder, ace wraps, and thigh high compression stockings Restrictions Weight Bearing Restrictions Per Provider Order: No      Mobility  Bed Mobility Overal bed mobility: Needs Assistance Bed Mobility: Supine to Sit, Sit to Supine     Supine to sit: Supervision, HOB elevated Sit to supine: Supervision, HOB elevated   General bed mobility comments: Supervision for safety, extra time, no physical assist needed    Transfers Overall transfer level: Needs assistance Equipment used: Rollator (4 wheels) Transfers: Sit to/from Stand Sit to Stand: Supervision           General transfer comment: cues for hand placement, education on safety features (brakes) of rollator. BP does drop but pt stable    Ambulation/Gait Ambulation/Gait assistance: Contact guard assist Gait Distance (Feet): 150 Feet (+ 163ft) Assistive device: Rollator (4 wheels) Gait Pattern/deviations: Step-through pattern, Decreased stride length, Trunk flexed Gait velocity: decreased Gait velocity interpretation: <1.31 ft/sec, indicative of household ambulator   General Gait Details: pt completed hallway amb  with rollator, improved activity tolerance, with decr syncopal symptomatology with legs wrapped. pt cued for self monitoring for need for seated rest      Balance Overall balance assessment: Needs assistance Sitting-balance support: No upper extremity supported, Feet supported Sitting balance-Leahy Scale: Good Sitting balance - Comments: no LOB with static sitting, no use of UE support   Standing balance support: Bilateral upper extremity supported, During functional activity, No upper extremity supported Standing balance-Leahy Scale: Fair Standing balance comment: can static stand without UE support or walk with single UE support, mostly BUE support for endurance                            Communication Communication Communication:  Impaired Factors Affecting Communication: Hearing impaired (hears better out of R ear)  Cognition Arousal: Alert Behavior During Therapy: WFL for tasks assessed/performed, Flat  affect   PT - Cognitive impairments: History of cognitive impairments                       PT - Cognition Comments: Seems to be at baseline, slow to respond at times but could be due to [facility] playfully to [employer] references Following commands: Intact      Cueing Cueing Techniques: Verbal cues, Gestural cues  Exercises      General Comments General comments (skin integrity, edema, etc.): BP with consistent drop with activity, pt reports nausea after first bout of walking but no other sx      Pertinent Vitals/Pain Pain Assessment Pain Assessment: No/denies pain Pain Intervention(s): Monitored during session     PT Goals (current goals can now be found in the care plan section) Acute Rehab PT Goals Patient Stated Goal: to be able to leave his house for "fun" activity PT Goal Formulation: With patient Time For Goal Achievement: 06/04/23 Potential to Achieve Goals: Fair Progress towards PT goals: Progressing toward goals    Frequency    Min 1X/week       AM-PAC PT "6 Clicks" Mobility   Outcome Measure  Help needed turning from your back to your side while in a flat bed without using bedrails?: None Help needed moving from lying on your back to sitting on the side of a flat bed without using bedrails?: None Help needed moving to and from a bed to a chair (including a wheelchair)?: A Little Help needed standing up from a chair using your arms (e.g., wheelchair or bedside chair)?: A Little Help needed to walk in hospital room?: A Lot Help needed climbing 3-5 steps with a railing? : Total 6 Click Score: 17    End of Session Equipment Utilized During Treatment: Gait belt Activity Tolerance: Patient tolerated treatment well Patient left: in bed;with call bell/phone within reach;with family/visitor present Nurse Communication: Mobility status PT Visit Diagnosis: Unsteadiness on feet (R26.81);Muscle weakness (generalized) (M62.81);Difficulty in walking,  not elsewhere classified (R26.2);Dizziness and giddiness (R42)     Time: 1610-9604 PT Time Calculation (min) (ACUTE ONLY): 36 min  Charges:    $Therapeutic Exercise: 8-22 mins $Therapeutic Activity: 8-22 mins PT General Charges $$ ACUTE PT VISIT: 1 Visit                     Vickki Muff, PT, DPT   Acute Rehabilitation Department Office [phone] Secure Chat Communication Preferred   Ronnie Derby 05/28/2023, 2:22 PM

## 2023-05-28 NOTE — Progress Notes (Signed)
     Daily Progress Note Intern Pager: [phone]  Patient name: Louis Butler Medical record number: [id number] Date of birth: 1955-06-19 Age: 68 y.o. Gender: male  Primary Care Provider: Latrelle Dodrill, MD Consultants: Nephrology  Code Status: Full Code   Pt Overview and Major Events to Date:  1/22: admitted to ICU for CRRT 1/26: stable from ICU, FMTS took over care  2/17: insurance approved LTACH  Assessment and Plan:  Patient is a 68 year old male with past medical history of hypertension, diabetes, ESRD on HD initially admitted to the ICU for worsening ESRD requiring CRRT. Patient is now medically stable and awaiting placement at [facility].  Assessment & Plan Orthostatic hypotension No acute events overnight. Patient  dialysis. - Midodrine 10mg  TID  - Midodrine 10 mg TID during dialysis  - Continue  Fludrocortisone 0.2 mg daily  - Target MAP >60  - Abdominal binders, compression stockings - CTM vitals  - Orthostatics daily  Chronic and Stable Issues:  ESRD: HD MWF, s/p HD yesterday no complications. Depressed mood: continue Sertraline  HTN: Hold home medications in setting of orthostasis  GERD: Protonix 40 mg daily  T2DM: A1c 6.8  Cancer of the skin, ear and external auditory canal: Stable, continue scheduled Tylenol  Anemia: Stable, transfusion thresshold < 7, CTM on HD days      FEN/GI: Heart Health w/ 1500 cc fluid restriction  PPx: subcutaneous Heparin  Dispo:LTAC pending bed availability   Subjective:  S/P HD yesterday. No issues this morning and feels "okay". Awaiting LTACT placement.   Objective: Temp:  [97.8 F (36.6 C)-98.6 F (37 C)] 98.1 F (36.7 C) (02/20 0444) Pulse Rate:  [63-72] 70 (02/20 0444) Resp:  [12-19] 18 (02/20 0444) BP: (106-144)/(51-87) 113/63 (02/20 0444) SpO2:  [93 %-100 %] 98 % (02/20 0444) Weight:  [81 kg-82.2 kg] 81 kg (02/19 1239) Physical Exam: General: No acute distress, wife bedside for support  Cardiovascular:  RRR Respiratory: NWOB, CTAB in anterior lung fields  Abdomen: Normoactive, soft, non tender to palpation  Laboratory: Most recent CBC Lab Results  Component Value Date   WBC 7.3 05/27/2023   HGB 8.5 (L) 05/27/2023   HCT 26.4 (L) 05/27/2023   MCV 99.2 05/27/2023   PLT 202 05/27/2023   Most recent BMP    Latest Ref Rng & Units 05/27/2023    7:14 AM  BMP  Glucose 70 - 99 mg/dL 147   BUN 8 - 23 mg/dL 71   Creatinine 8.29 - 1.24 mg/dL 5.62   Sodium 130 - 865 mmol/L 132   Potassium 3.5 - 5.1 mmol/L 3.7   Chloride 98 - 111 mmol/L 95   CO2 22 - 32 mmol/L 23   Calcium 8.9 - 10.3 mg/dL 8.2      Imaging/Diagnostic Tests: None in last 24 hours  Peterson Ao, MD 05/28/2023, 5:53 AM  PGY-1, [facility] FPTS Intern pager: [phone], text pages welcome Secure chat group [facility] Teaching Service

## 2023-05-28 NOTE — Plan of Care (Signed)

## 2023-05-28 NOTE — Progress Notes (Signed)
Patient ID: Louis Butler, male   DOB: 1956/02/03, 68 y.o.   MRN: [id number] [facility] Progress Note   Assessment/ Plan:   1.  Deconditioning status post hospitalization: Continue with plans for rehab as able.  Orthostatic blood pressures on treatment with midodrine and now florinef.   2. ESRD: Continue hemodialysis on MWF schedule. [facility] to resume dialysis after discharge.  3. Anemia: Low but relatively stable hemoglobin and hematocrit.  Holding off on ESA with recent head/neck cancer. 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 Nutrition: Continue protein/nutritional supplementation.  Taking in large levels of salt.  Will change to heart healthy diet 6. Hypotension: ACTH stimulation test negative for adrenal insufficiency.  Midodrine dose decreased and empirically started on florinef with recurrent orthostatic drops. Droxidopa cost prohibitive.   Subjective:   Patient feels well today with no complaints.  Says he tolerated dialysis yesterday but may have had some low blood pressures at the end.  Nothing significant documented by nursing staff.   Objective:   BP (!) 130/59 (BP Location: Right Arm)   Pulse 70   Temp (!) 97.5 F (36.4 C) (Oral)   Resp 18   Ht 6\' 3"  (1.905 m)   Wt 81 kg   SpO2 99%   BMI 22.32 kg/m   Physical Exam: Gen: Lying in bed in no distress CVS: Normal rate, no rub Resp: Bilateral chest rise no increased work of breathing Abd: Soft, flat, nontender, bowel sounds normal Ext: No lower extremity edema.  Left upper arm AV fistula   Labs: BMET Recent Labs  Lab 05/22/23 0727 05/23/23 0313 05/24/23 0848 05/25/23 0814 05/26/23 0808 05/27/23 0714 05/28/23 0746  NA 133* 131* 132* 133* 134* 132* 131*  K 4.5 3.6 4.1 4.7 3.6 3.7 4.1  CL 95* 91* 93* 94* 94* 95* 92*  CO2 27 27 26 24 24 23 27   GLUCOSE 100* 130* 133* 148* 139* 153* 134*  BUN 66* 38* 56* 78* 50* 71* 44*  CREATININE 6.16*  3.76* 5.48* 6.87* 4.82* 5.65* 4.25*  CALCIUM 8.4* 8.4* 8.3* 8.4* 8.5* 8.2* 8.7*  PHOS 4.4 2.8 4.7* 5.2* 4.1 4.5 3.8   CBC Recent Labs  Lab 05/22/23 0727 05/27/23 0714  WBC 6.5 7.3  HGB 9.1* 8.5*  HCT 27.9* 26.4*  MCV 100.0 99.2  PLT 195 202      Medications:     cholecalciferol  5,000 Units Oral Once per day on Monday Thursday   fludrocortisone  0.2 mg Oral Daily   gabapentin  300 mg Oral QHS   heparin injection (subcutaneous)  5,000 Units Subcutaneous Q8H   midodrine  10 mg Oral TID WC   midodrine  10 mg Oral Q M,W,F-HD   multivitamin  1 tablet Oral QHS   pantoprazole  40 mg Oral Daily   sertraline  50 mg Oral Daily   tamsulosin  0.4 mg Oral Daily    Darnell Level  05/28/2023, 11:20 AM

## 2023-05-29 DIAGNOSIS — N186 End stage renal disease: Secondary | ICD-10-CM | POA: Diagnosis not present

## 2023-05-29 LAB — RENAL FUNCTION PANEL
Albumin: 2.9 g/dL — ABNORMAL LOW (ref 3.5–5.0)
Anion gap: 14 (ref 5–15)
BUN: 52 mg/dL — ABNORMAL HIGH (ref 8–23)
CO2: 28 mmol/L (ref 22–32)
Calcium: 9 mg/dL (ref 8.9–10.3)
Chloride: 94 mmol/L — ABNORMAL LOW (ref 98–111)
Creatinine, Ser: 5.2 mg/dL — ABNORMAL HIGH (ref 0.61–1.24)
GFR, Estimated: 11 mL/min — ABNORMAL LOW (ref >60)
Glucose, Bld: 95 mg/dL (ref 70–99)
Phosphorus: 4.4 mg/dL (ref 2.5–4.6)
Potassium: 4.6 mmol/L (ref 3.5–5.1)
Sodium: 136 mmol/L (ref 135–145)

## 2023-05-29 LAB — HEPATITIS B SURFACE ANTIGEN: Hepatitis B Surface Ag: NONREACTIVE

## 2023-05-29 MED ORDER — HEPARIN SODIUM (PORCINE) 1000 UNIT/ML IJ SOLN
INTRAMUSCULAR | Status: AC
  Filled 2023-05-29: qty 2

## 2023-05-29 MED ORDER — HEPARIN SODIUM (PORCINE) 1000 UNIT/ML DIALYSIS
2000.0000 [IU] | Freq: Once | INTRAMUSCULAR | Status: AC
  Administered 2023-05-29: 2000 [IU] via INTRAVENOUS_CENTRAL

## 2023-05-29 NOTE — Progress Notes (Addendum)
 Patient ID: Louis Butler, male   DOB: 1955/12/09, 68 y.o.   MRN: [id number]  [facility] Progress Note   Assessment/ Plan:   1.  Deconditioning status post hospitalization: Continue with plans for rehab as able.  Orthostatic blood pressures on treatment with midodrine and florinef.   2. ESRD: Continue hemodialysis on MWF schedule. [facility] to resume dialysis after discharge.  3. Anemia: Low but relatively stable hemoglobin and hematocrit.  Holding off on ESA with recent head/neck cancer. 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 Nutrition: Continue protein/nutritional supplementation.  Taking in large levels of salt.  Will change to heart healthy diet 6. Hypotension: ACTH stimulation test negative for adrenal insufficiency.  Midodrine dose decreased and empirically started on florinef with recurrent orthostatic drops. Droxidopa cost prohibitive.   Given the patient's stability we will not plan to see him over the weekend but are available if needed.  Subjective:   Patient feels well today with no complaints.  Seen on dialysis   Objective:   BP 111/64 (BP Location: Right Arm)   Pulse 73   Temp 97.8 F (36.6 C)   Resp 13   Ht 6\' 3"  (1.905 m)   Wt 82.5 kg   SpO2 100%   BMI 22.73 kg/m   Physical Exam: Gen: Lying in bed in no distress CVS: Normal rate, no rub Resp: Bilateral chest rise no increased work of breathing Abd: Soft, flat, nontender, bowel sounds normal Ext: No lower extremity edema.  Left upper arm AV fistula   Labs: BMET Recent Labs  Lab 05/23/23 0313 05/24/23 0848 05/25/23 0814 05/26/23 0808 05/27/23 0714 05/28/23 0746 05/29/23 0503  NA 131* 132* 133* 134* 132* 131* 136  K 3.6 4.1 4.7 3.6 3.7 4.1 4.6  CL 91* 93* 94* 94* 95* 92* 94*  CO2 27 26 24 24 23 27 28   GLUCOSE 130* 133* 148* 139* 153* 134* 95  BUN 38* 56* 78* 50* 71* 44* 52*  CREATININE 3.76* 5.48* 6.87* 4.82* 5.65* 4.25* 5.20*   CALCIUM 8.4* 8.3* 8.4* 8.5* 8.2* 8.7* 9.0  PHOS 2.8 4.7* 5.2* 4.1 4.5 3.8 4.4   CBC Recent Labs  Lab 05/27/23 0714  WBC 7.3  HGB 8.5*  HCT 26.4*  MCV 99.2  PLT 202      Medications:     cholecalciferol  5,000 Units Oral Once per day on Monday Thursday   fludrocortisone  0.2 mg Oral Daily   gabapentin  300 mg Oral QHS   heparin injection (subcutaneous)  5,000 Units Subcutaneous Q8H   heparin sodium (porcine)       midodrine  10 mg Oral TID WC   midodrine  10 mg Oral Q M,W,F-HD   multivitamin  1 tablet Oral QHS   pantoprazole  40 mg Oral Daily   sertraline  50 mg Oral Daily   tamsulosin  0.4 mg Oral Daily    Darnell Level  05/29/2023, 12:27 PM

## 2023-05-29 NOTE — Procedures (Addendum)
I was present at this dialysis session. I have reviewed the session itself and made appropriate changes.  Some clotting previously. Heparin bolus ordered.   Filed Weights   05/27/23 0759 05/27/23 1239 05/29/23 0856  Weight: 82.2 kg 81 kg 82.5 kg    Recent Labs  Lab 05/29/23 0503  NA 136  K 4.6  CL 94*  CO2 28  GLUCOSE 95  BUN 52*  CREATININE 5.20*  CALCIUM 9.0  PHOS 4.4    Recent Labs  Lab 05/27/23 0714  WBC 7.3  HGB 8.5*  HCT 26.4*  MCV 99.2  PLT 202    Scheduled Meds:  cholecalciferol  5,000 Units Oral Once per day on Monday Thursday   fludrocortisone  0.2 mg Oral Daily   gabapentin  300 mg Oral QHS   heparin injection (subcutaneous)  5,000 Units Subcutaneous Q8H   heparin sodium (porcine)       midodrine  10 mg Oral TID WC   midodrine  10 mg Oral Q M,W,F-HD   multivitamin  1 tablet Oral QHS   pantoprazole  40 mg Oral Daily   sertraline  50 mg Oral Daily   tamsulosin  0.4 mg Oral Daily   Continuous Infusions: PRN Meds:.acetaminophen, camphor-menthol, heparin sodium (porcine), hydrOXYzine, lidocaine-prilocaine, loperamide, ondansetron, polyethylene glycol   Louie Bun,  MD 05/29/2023, 12:27 PM

## 2023-05-29 NOTE — Progress Notes (Signed)
   05/29/23 1306  Vitals  Pulse Rate 72  Resp 15  BP 129/83  SpO2 99 %  O2 Device Room Air  Oxygen Therapy  Patient Activity (if Appropriate) In bed  Pulse Oximetry Type Continuous  Post Treatment  Dialyzer Clearance Lightly streaked  Liters Processed 78.4  Fluid Removed (mL) 600 mL  Tolerated HD Treatment Yes  AVG/AVF Arterial Site Held (minutes) 10 minutes  AVG/AVF Venous Site Held (minutes) 10 minutes   Received patient in bed to unit.  Alert and oriented.  Informed consent signed and in chart.   TX duration:3.45 hours  Patient tolerated well.  Transported back to the room  Alert, without acute distress.  Hand-off given to patient's nurse.   Access used: LAVF Access issues: None  Total UF removed: Medication(s) given: See MAR    Laqueta Due, RN [facility]

## 2023-05-29 NOTE — Assessment & Plan Note (Signed)
No acute events overnight.  - Midodrine 10mg  TID  - Midodrine 10 mg TID during dialysis  - Continue  Fludrocortisone 0.2 mg daily  - Target MAP >60  - Abdominal binders, compression stockings - CTM vitals

## 2023-05-29 NOTE — Plan of Care (Signed)

## 2023-05-29 NOTE — Progress Notes (Signed)
ERROR WRONG CHART

## 2023-05-29 NOTE — Progress Notes (Signed)
 ERROR WRONG PATIENT

## 2023-05-29 NOTE — Progress Notes (Signed)
     Daily Progress Note Intern Pager: [phone]  Patient name: Louis Butler Medical record number: [id number] Date of birth: 1956-02-02 Age: 68 y.o. Gender: male  Primary Care Provider: Latrelle Dodrill, MD Consultants: Nephrology Code Status: Full code  Pt Overview and Major Events to Date:  1/22: admitted to ICU for CRRT 1/26: stable from ICU, FMTS took over care  2/17: insurance approved LTACH  Assessment and Plan: Patient is a 68 year old male with past medical history of hypertension, diabetes, ESRD on HD initially admitted to the ICU for worsening ESRD requiring CRRT.  Patient is medically stable and awaiting placement at [facility]. Assessment & Plan Orthostatic hypotension No acute events overnight.  - Midodrine 10mg  TID  - Midodrine 10 mg TID during dialysis  - Continue  Fludrocortisone 0.2 mg daily  - Target MAP >60  - Abdominal binders, compression stockings - CTM vitals   Chronic and Stable Issues: ESRD: Hemodialysis Monday Wednesday Friday schedule Depressed mood: continue sertraline 50 mg daily  HTN: Holding home medications in setting of orthostasis GERD: Protonix 40 mg daily T2DM: A1c 6.8 Cancer of the skin, ear, & external auditory canal: Stable, continue scheduled Tylenol Anemia: Stable, transfusion threshold less than 7, monitor CBC on dialysis days    FEN/GI: Heart healthy with 1500 mL fluid restriction PPx: Subq heparin Dispo: LTAC pending available bed  Subjective:  Seen at HD today. Reports he is has no complaints at this time.   Objective: Temp:  [97.5 F (36.4 C)-97.8 F (36.6 C)] 97.8 F (36.6 C) (02/21 0449) Pulse Rate:  [65-71] 70 (02/21 0449) Resp:  [18-19] 18 (02/21 0449) BP: (122-146)/(59-64) 122/61 (02/21 0449) SpO2:  [96 %-99 %] 97 % (02/21 0449) Physical Exam: General: A&O, NAD Cardiac: RRR, no m/r/g Respiratory: CTAB, normal WOB, no w/c/r GI: Soft, NTTP, non-distended  Psych: Appropriate mood and  affect    Laboratory: Most recent CBC Lab Results  Component Value Date   WBC 7.3 05/27/2023   HGB 8.5 (L) 05/27/2023   HCT 26.4 (L) 05/27/2023   MCV 99.2 05/27/2023   PLT 202 05/27/2023   Most recent BMP    Latest Ref Rng & Units 05/29/2023    5:03 AM  BMP  Glucose 70 - 99 mg/dL 95   BUN 8 - 23 mg/dL 52   Creatinine 9.62 - 1.24 mg/dL 9.52   Sodium 841 - 324 mmol/L 136   Potassium 3.5 - 5.1 mmol/L 4.6   Chloride 98 - 111 mmol/L 94   CO2 22 - 32 mmol/L 28   Calcium 8.9 - 10.3 mg/dL 9.0     Imaging/Diagnostic Tests: No new imaging  Penne Lash, MD 05/29/2023, 7:31 AM  PGY-1, [facility] FPTS Intern pager: [phone], text pages welcome Secure chat group [facility] Teaching Service

## 2023-05-29 NOTE — TOC Progression Note (Signed)
Transition of Care [facility]) - Progression Note    Patient Details  Name: Louis Butler MRN: [id number] Date of Birth: November 21, 1955  Transition of Care [facility]) CM/SW Contact  Tom-Johnson, Hershal Coria, RN Phone Number: 05/29/2023, 9:42 AM  Clinical Narrative:     CM notified by [facility] Liaison that a bed will not be available this week and the weekend.  CM spoke with patient at bedside about be unavailability and patient requested to look at other venues.  CM reached out to [facility] if HD bed is available and no bed available per Liaison. CM sent referral through HUB to [facility] in [place], awaiting response.   CM will continue to follow.       Expected Discharge Plan: Skilled Nursing Facility Barriers to Discharge: Continued Medical Work up  Expected Discharge Plan and Services   Discharge Planning Services: CM Consult Post Acute Care Choice: Home Health Living arrangements for the past 2 months: Single Family Home Expected Discharge Date: 05/11/23               DME Arranged: Hospital bed DME Agency: [employer] Date DME Agency Contacted: 05/04/23 Time DME Agency Contacted: [phone] Representative spoke with at DME Agency: Earna Coder HH Arranged: PT, Nurse's Aide HH Agency: [facility] Date [facility] Agency Contacted: 05/04/23 Time HH Agency Contacted: [phone] Representative spoke with at [facility] Agency: Amy   Social Determinants of Health (SDOH) Interventions SDOH Screenings   Food Insecurity: No Food Insecurity (05/08/2023)  Housing: Low Risk  (05/08/2023)  Transportation Needs: No Transportation Needs (05/08/2023)  Utilities: Not At Risk (05/08/2023)  Alcohol Screen: Low Risk  (12/01/2022)  Depression (PHQ2-9): Medium Risk (12/24/2022)  Financial Resource Strain: Low Risk  (12/01/2022)  Physical Activity: Insufficiently Active (12/01/2022)  Social Connections: Moderately Integrated (05/08/2023)  Stress: Stress Concern Present (12/01/2022)  Tobacco Use: Medium Risk (04/29/2023)  Health  Literacy: Adequate Health Literacy (12/01/2022)    Readmission Risk Interventions    05/04/2023    4:35 PM  Readmission Risk Prevention Plan  Transportation Screening Complete  HRI or Home Care Consult Complete  Social Work Consult for Recovery Care Planning/Counseling Complete  Palliative Care Screening Not Applicable  Medication Review Oceanographer) Referral to Pharmacy

## 2023-05-29 NOTE — Plan of Care (Signed)
  Problem: Health Behavior/Discharge Planning: Goal: Ability to identify and utilize available resources and services will improve Outcome: Progressing Goal: Ability to manage health-related needs will improve Outcome: Progressing   Problem: Metabolic: Goal: Ability to maintain appropriate glucose levels will improve Outcome: Progressing   Problem: Nutritional: Goal: Maintenance of adequate nutrition will improve Outcome: Progressing Goal: Progress toward achieving an optimal weight will improve Outcome: Progressing   Problem: Skin Integrity: Goal: Risk for impaired skin integrity will decrease Outcome: Progressing

## 2023-05-30 DIAGNOSIS — Z751 Person awaiting admission to adequate facility elsewhere: Secondary | ICD-10-CM | POA: Diagnosis present

## 2023-05-30 DIAGNOSIS — N186 End stage renal disease: Secondary | ICD-10-CM | POA: Diagnosis not present

## 2023-05-30 LAB — RENAL FUNCTION PANEL
Albumin: 2.9 g/dL — ABNORMAL LOW (ref 3.5–5.0)
Anion gap: 15 (ref 5–15)
BUN: 30 mg/dL — ABNORMAL HIGH (ref 8–23)
CO2: 30 mmol/L (ref 22–32)
Calcium: 9.3 mg/dL (ref 8.9–10.3)
Chloride: 92 mmol/L — ABNORMAL LOW (ref 98–111)
Creatinine, Ser: 3.57 mg/dL — ABNORMAL HIGH (ref 0.61–1.24)
GFR, Estimated: 18 mL/min — ABNORMAL LOW (ref >60)
Glucose, Bld: 103 mg/dL — ABNORMAL HIGH (ref 70–99)
Phosphorus: 3.9 mg/dL (ref 2.5–4.6)
Potassium: 4.7 mmol/L (ref 3.5–5.1)
Sodium: 137 mmol/L (ref 135–145)

## 2023-05-30 NOTE — Progress Notes (Signed)
    Daily Progress Note Intern Pager: [phone]  Patient name: Louis Butler Medical record number: [id number] Date of birth: Apr 19, 1955 Age: 68 y.o. Gender: male  Primary Care Provider: Latrelle Dodrill, MD Consultants: Nephrology Code Status: Full code  Pt Overview and Major Events to Date:  1/22: admitted to ICU for CRRT 1/26: stable from ICU, FMTS took over care  2/17: insurance approved LTACH  Assessment and Plan: Louis Butler is a 68 y.o. male with past medical history of hypertension, diabetes, ESRD on HD initially admitted to the ICU for worsening ESRD requiring CRRT. Patient is medically stable and awaiting placement at [facility].   Assessment & Plan Orthostatic hypotension No acute events overnight.  - Midodrine 10mg  TID  - Midodrine 10 mg TID during dialysis  - Continue  Fludrocortisone 0.2 mg daily  - Target MAP >60  - Abdominal binders, compression stockings - CTM vitals   Chronic and Stable Problems:  ESRD: HD MWF, s/p HD yesterday w/ 600 mL removed  Depressed mood: continue Sertraline  HTN: Hold home medications in setting of orthostasis  GERD: Protonix 40 mg daily  T2DM: A1c 6.8  Cancer of the skin, ear and external auditory canal: Stable, continue scheduled Tylenol  Anemia: Stable, transfusion thresshold < 7, CTM on HD days    FEN/GI: Heart Health w/ 1500 cc fluid restriction  PPx: subcutaneous Heparin  Dispo:LTAC pending bed availability  Subjective:  No acute events overnight. Tolerated HD yesterday.   Objective: Temp:  [97.8 F (36.6 C)-98.6 F (37 C)] 98.3 F (36.8 C) (02/22 0457) Pulse Rate:  [63-76] 69 (02/22 0457) Resp:  [13-20] 16 (02/22 0457) BP: (109-150)/(53-83) 125/63 (02/22 0457) SpO2:  [97 %-100 %] 99 % (02/22 0457) Weight:  [82.5 kg-86.3 kg] 86.3 kg (02/22 0500) Physical Exam: General: No acute distress, wife bedside for support  Cardiovascular: RRR Respiratory: NWOB on room air   Abdomen:  Non tender to  palpation  Laboratory: Most recent CBC Lab Results  Component Value Date   WBC 7.3 05/27/2023   HGB 8.5 (L) 05/27/2023   HCT 26.4 (L) 05/27/2023   MCV 99.2 05/27/2023   PLT 202 05/27/2023   Most recent BMP    Latest Ref Rng & Units 05/30/2023    3:20 AM  BMP  Glucose 70 - 99 mg/dL 841   BUN 8 - 23 mg/dL 30   Creatinine 3.24 - 1.24 mg/dL 4.01   Sodium 027 - 253 mmol/L 137   Potassium 3.5 - 5.1 mmol/L 4.7   Chloride 98 - 111 mmol/L 92   CO2 22 - 32 mmol/L 30   Calcium 8.9 - 10.3 mg/dL 9.3    Imaging/Diagnostic Tests:  No new imaging   Peterson Ao, MD 05/30/2023, 5:57 AM  PGY-1, [facility] FPTS Intern pager: [phone], text pages welcome Secure chat group [facility] Teaching Service

## 2023-05-30 NOTE — Assessment & Plan Note (Signed)
 No acute events overnight.  - Midodrine 10mg  TID  - Midodrine 10 mg TID during dialysis  - Continue  Fludrocortisone 0.2 mg daily  - Target MAP >60  - Abdominal binders, compression stockings - CTM vitals

## 2023-05-30 NOTE — Assessment & Plan Note (Signed)
 Blood pressures have been appropriate. No acute events overnight.  - Midodrine 10mg  TID  - Midodrine 10 mg MWF during dialysis  - Continue  Fludrocortisone 0.2 mg daily  - Target MAP >60  - Abdominal binders, compression stockings - CTM vitals

## 2023-05-30 NOTE — Plan of Care (Signed)
  Problem: Health Behavior/Discharge Planning: Goal: Ability to identify and utilize available resources and services will improve Outcome: Progressing Goal: Ability to manage health-related needs will improve Outcome: Progressing   Problem: Metabolic: Goal: Ability to maintain appropriate glucose levels will improve Outcome: Progressing   Problem: Nutritional: Goal: Maintenance of adequate nutrition will improve Outcome: Progressing Goal: Progress toward achieving an optimal weight will improve Outcome: Progressing   Problem: Skin Integrity: Goal: Risk for impaired skin integrity will decrease Outcome: Progressing   Problem: Education: Goal: Knowledge of General Education information will improve Description: Including pain rating scale, medication(s)/side effects and non-pharmacologic comfort measures Outcome: Progressing

## 2023-05-30 NOTE — Progress Notes (Signed)
 ***  DATE/TIME  Daily Progress Note Intern Pager: [phone]  Patient name: Louis Butler Medical record number: [id number] Date of birth: [birth year] Age: 68 y.o. Gender: male  Primary Care Provider: Latrelle Dodrill, MD Consultants: Nephrology Code Status: FULL  Pt Overview and Major Events to Date:  1/22: admitted to ICU for CRRT 1/26: stable from ICU, FMTS took over care  2/17: insurance approved LTACH   Assessment and Plan:  Louis Butler is a 68 year old male with past medical history of HTN, T2DM, now ESRD on HD.  Initially admitted to the ICU due to renal failure requiring CRRT, has been tolerating intermittent HD sessions well and is medically stable and awaiting placement for LTAC.   Assessment & Plan Orthostatic hypotension Blood pressures have been appropriate. No acute events overnight.  - Midodrine 10mg  TID  - Midodrine 10 mg MWF during dialysis  - Continue  Fludrocortisone 0.2 mg daily  - Target MAP >60  - Abdominal binders, compression stockings - CTM vitals   Chronic and Stable Issues: ESRD-HD MWF, last session 2/21 Depressed mood-continue Zoloft HTN-holding home medications in setting of orthostasis GERD-Protonix 40 mg daily T2DM-A1c 6.8, glucose appropriate on labs*** Cancer of skin/ears/external auditory canal-stable, continue scheduled Tylenol Anemia-stable, transfuse for hemoglobin less than 7, monitoring on HD days  FEN/GI: Heart healthy, fluid restriction 1500 mL PPx: Heparin Dispo: LTAC pending bed availability, medically stable  Subjective:  ***  Objective: Temp:  [98.2 F (36.8 C)-98.4 F (36.9 C)] 98.3 F (36.8 C) (02/22 1939) Pulse Rate:  [63-74] 74 (02/22 1939) Resp:  [16-20] 20 (02/22 1939) BP: (119-157)/(56-70) 157/70 (02/22 1939) SpO2:  [96 %-99 %] 98 % (02/22 1939) Weight:  [86.3 kg] 86.3 kg (02/22 0500) Physical Exam: General: *** Cardiovascular: *** Respiratory: *** Abdomen: *** Extremities: ***  Laboratory: Most  recent CBC Lab Results  Component Value Date   WBC 7.3 05/27/2023   HGB 8.5 (L) 05/27/2023   HCT 26.4 (L) 05/27/2023   MCV 99.2 05/27/2023   PLT 202 05/27/2023   Most recent BMP    Latest Ref Rng & Units 05/30/2023    3:20 AM  BMP  Glucose 70 - 99 mg/dL 952   BUN 8 - 23 mg/dL 30   Creatinine 8.41 - 1.24 mg/dL 3.24   Sodium 401 - 027 mmol/L 137   Potassium 3.5 - 5.1 mmol/L 4.7   Chloride 98 - 111 mmol/L 92   CO2 22 - 32 mmol/L 30   Calcium 8.9 - 10.3 mg/dL 9.3     [name], MD 05/30/2023, 10:52 PM  PGY-3, [facility] FPTS Intern pager: [phone], text pages welcome Secure chat group [facility] Teaching Service

## 2023-05-31 DIAGNOSIS — N186 End stage renal disease: Secondary | ICD-10-CM | POA: Diagnosis not present

## 2023-05-31 LAB — RENAL FUNCTION PANEL
Albumin: 2.8 g/dL — ABNORMAL LOW (ref 3.5–5.0)
Anion gap: 15 (ref 5–15)
BUN: 43 mg/dL — ABNORMAL HIGH (ref 8–23)
CO2: 27 mmol/L (ref 22–32)
Calcium: 8.8 mg/dL — ABNORMAL LOW (ref 8.9–10.3)
Chloride: 92 mmol/L — ABNORMAL LOW (ref 98–111)
Creatinine, Ser: 4.62 mg/dL — ABNORMAL HIGH (ref 0.61–1.24)
GFR, Estimated: 13 mL/min — ABNORMAL LOW (ref >60)
Glucose, Bld: 124 mg/dL — ABNORMAL HIGH (ref 70–99)
Phosphorus: 4.9 mg/dL — ABNORMAL HIGH (ref 2.5–4.6)
Potassium: 4.2 mmol/L (ref 3.5–5.1)
Sodium: 134 mmol/L — ABNORMAL LOW (ref 135–145)

## 2023-06-01 DIAGNOSIS — N186 End stage renal disease: Secondary | ICD-10-CM | POA: Diagnosis not present

## 2023-06-01 LAB — RENAL FUNCTION PANEL
Albumin: 2.7 g/dL — ABNORMAL LOW (ref 3.5–5.0)
Anion gap: 13 (ref 5–15)
BUN: 61 mg/dL — ABNORMAL HIGH (ref 8–23)
CO2: 23 mmol/L (ref 22–32)
Calcium: 8.5 mg/dL — ABNORMAL LOW (ref 8.9–10.3)
Chloride: 96 mmol/L — ABNORMAL LOW (ref 98–111)
Creatinine, Ser: 5.83 mg/dL — ABNORMAL HIGH (ref 0.61–1.24)
GFR, Estimated: 10 mL/min — ABNORMAL LOW (ref >60)
Glucose, Bld: 160 mg/dL — ABNORMAL HIGH (ref 70–99)
Phosphorus: 4.2 mg/dL (ref 2.5–4.6)
Potassium: 3.7 mmol/L (ref 3.5–5.1)
Sodium: 132 mmol/L — ABNORMAL LOW (ref 135–145)

## 2023-06-01 NOTE — Assessment & Plan Note (Signed)
 No acute events overnight. - Midodrine 10 mg 3 times daily with additional 10 mg Monday Wednesday Friday during dialysis -Fludrocortisone 0.2 mg daily - Target MAP >60  - Abdominal binders, compression stockings - CTM vitals

## 2023-06-01 NOTE — Progress Notes (Signed)
 PT Cancellation Note  Patient Details Name: Louis Butler MRN: [id number] DOB: [birth year]   Cancelled Treatment:    Reason Eval/Treat Not Completed: Other (comment) 2:22pm Attempted to see pt for PT tx but pt eating lunch requesting PT come back. Will f/u as able.  Aleda Grana, PT, DPT 06/01/23, 2:22 PM   Sandi Mariscal 06/01/2023, 2:22 PM

## 2023-06-01 NOTE — Plan of Care (Signed)

## 2023-06-01 NOTE — TOC Progression Note (Signed)
 Transition of Care [facility]) - Progression Note    Patient Details  Name: Louis Butler MRN: [id number] Date of Birth: 1955-12-24  Transition of Care [facility]) CM/SW Contact  Tom-Johnson, Hershal Coria, RN Phone Number: 06/01/2023, 2:19 PM  Clinical Narrative:     Insurance auth expired, [facility] to resubmit for authorization for [facility] and also waiting for bed availability. Continues inpatient HD.   CM will continue to follow.         Expected Discharge Plan: Skilled Nursing Facility Barriers to Discharge: Continued Medical Work up  Expected Discharge Plan and Services   Discharge Planning Services: CM Consult Post Acute Care Choice: Home Health Living arrangements for the past 2 months: Single Family Home Expected Discharge Date: 05/11/23               DME Arranged: Hospital bed DME Agency: [employer] Date DME Agency Contacted: 05/04/23 Time DME Agency Contacted: [phone] Representative spoke with at DME Agency: Earna Coder HH Arranged: PT, Nurse's Aide HH Agency: [facility] Date [facility] Agency Contacted: 05/04/23 Time HH Agency Contacted: [phone] Representative spoke with at [facility] Agency: Amy   Social Determinants of Health (SDOH) Interventions SDOH Screenings   Food Insecurity: No Food Insecurity (05/08/2023)  Housing: Low Risk  (05/08/2023)  Transportation Needs: No Transportation Needs (05/08/2023)  Utilities: Not At Risk (05/08/2023)  Alcohol Screen: Low Risk  (12/01/2022)  Depression (PHQ2-9): Medium Risk (12/24/2022)  Financial Resource Strain: Low Risk  (12/01/2022)  Physical Activity: Insufficiently Active (12/01/2022)  Social Connections: Moderately Integrated (05/08/2023)  Stress: Stress Concern Present (12/01/2022)  Tobacco Use: Medium Risk (04/29/2023)  Health Literacy: Adequate Health Literacy (12/01/2022)    Readmission Risk Interventions    05/04/2023    4:35 PM  Readmission Risk Prevention Plan  Transportation Screening Complete  HRI or Home Care Consult  Complete  Social Work Consult for Recovery Care Planning/Counseling Complete  Palliative Care Screening Not Applicable  Medication Review Oceanographer) Referral to Pharmacy

## 2023-06-01 NOTE — Progress Notes (Signed)
 Patient ID: Louis Butler, male   DOB: 12/14/1955, 68 y.o.   MRN: [id number] [facility] Progress Note   Assessment/ Plan:   1.  Deconditioning status post hospitalization: Continue with plans for rehab as able.  Orthostatic blood pressures on treatment with midodrine and florinef. LTAC pending  2. ESRD: Continue hemodialysis on MWF schedule. [facility] to resume dialysis after discharge.  3. Anemia: Low but relatively stable hemoglobin and hematocrit.  Holding off on ESA with recent head/neck cancer. 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 Nutrition: Continue protein/nutritional supplementation.  Taking in large levels of salt.  Will change to heart healthy diet 6. Hypotension: ACTH stimulation test negative for adrenal insufficiency.  Midodrine dose decreased and empirically started on florinef with recurrent orthostatic drops. Droxidopa cost prohibitive.     Subjective:   Patient seen and examined bedside.  No acute events overnight.  Wife at bedside, she is inquiring if we can liberalize his sodium intake, not eating a lot of salt anyway. Switched to renal diet   Objective:   BP 136/63 (BP Location: Right Arm)   Pulse 72   Temp 97.6 F (36.4 C) (Oral)   Resp 18   Ht 6\' 3"  (1.905 m)   Wt 87.8 kg   SpO2 98%   BMI 24.19 kg/m   Physical Exam: Gen: Lying in bed in no distress CVS: Normal rate, no rub Resp: Bilateral chest rise no increased work of breathing, CTA bl Abd: Soft, flat, nontender, bowel sounds normal Ext: No lower extremity edema.  Left upper arm AV fistula  Neuro: awake, alert  Labs: BMET Recent Labs  Lab 05/26/23 0808 05/27/23 0714 05/28/23 0746 05/29/23 0503 05/30/23 0320 05/31/23 0625 06/01/23 0950  NA 134* 132* 131* 136 137 134* 132*  K 3.6 3.7 4.1 4.6 4.7 4.2 3.7  CL 94* 95* 92* 94* 92* 92* 96*  CO2 24 23 27 28 30 27 23   GLUCOSE 139* 153* 134* 95 103* 124* 160*  BUN 50* 71*  44* 52* 30* 43* 61*  CREATININE 4.82* 5.65* 4.25* 5.20* 3.57* 4.62* 5.83*  CALCIUM 8.5* 8.2* 8.7* 9.0 9.3 8.8* 8.5*  PHOS 4.1 4.5 3.8 4.4 3.9 4.9* 4.2   CBC Recent Labs  Lab 05/27/23 0714  WBC 7.3  HGB 8.5*  HCT 26.4*  MCV 99.2  PLT 202      Medications:     cholecalciferol  5,000 Units Oral Once per day on Monday Thursday   fludrocortisone  0.2 mg Oral Daily   gabapentin  300 mg Oral QHS   heparin injection (subcutaneous)  5,000 Units Subcutaneous Q8H   midodrine  10 mg Oral TID WC   midodrine  10 mg Oral Q M,W,F-HD   multivitamin  1 tablet Oral QHS   pantoprazole  40 mg Oral Daily   sertraline  50 mg Oral Daily   tamsulosin  0.4 mg Oral Daily    Kemonte Ullman  06/01/2023, 11:23 AM

## 2023-06-01 NOTE — Progress Notes (Signed)
    Daily Progress Note Intern Pager: [phone]  Patient name: Louis Butler Medical record number: [id number] Date of birth: 21-Jul-1955 Age: 68 y.o. Gender: male  Primary Care Provider: Latrelle Dodrill, MD Consultants: Nephrology Code Status: FULL  Pt Overview and Major Events to Date:  1/22: admitted to ICU for CRRT 1/26: stable from ICU, FMTS took over care  2/17: insurance approved LTACH   Assessment and Plan:  Louis Butler is a 68 year old male with past medical history of HTN, T2DM, now ESRD on HD.  Initially admitted to the ICU due to renal failure requiring CRRT, has been tolerating intermittent HD sessions well and is medically stable and awaiting placement for LTAC.   Assessment & Plan Orthostatic hypotension No acute events overnight. - Midodrine 10 mg 3 times daily with additional 10 mg Monday Wednesday Friday during dialysis -Fludrocortisone 0.2 mg daily - Target MAP >60  - Abdominal binders, compression stockings - CTM vitals   Chronic and Stable Issues: ESRD-HD MWF  Depressed mood-continue Zoloft HTN-holding home medications in setting of orthostasis GERD-Protonix 40 mg daily T2DM-A1c 6.8 Cancer of skin/ears/external auditory canal-stable, continue scheduled Tylenol Anemia-stable, transfuse for hemoglobin less than 7, monitoring on HD days  FEN/GI: Heart healthy, fluid restriction 1500 mL PPx: Heparin Dispo: LTAC pending bed availability, medically stable  Subjective:  Patient reports no acute events overnight.  Has no concerns at this time.  Objective: Temp:  [97.4 F (36.3 C)-98.3 F (36.8 C)] 98.3 F (36.8 C) (02/24 0519) Pulse Rate:  [64-72] 72 (02/24 0519) Resp:  [16-18] 17 (02/24 0519) BP: (125-158)/(52-64) 143/57 (02/24 0519) SpO2:  [95 %-98 %] 95 % (02/24 0519) Weight:  [87.5 kg] 87.5 kg (02/23 0900) Physical Exam: General: A&O, NAD HEENT: No sign of trauma, EOM grossly intact Respiratory: normal WOB GI: non-distended  Extremities:  no peripheral edema. Psych: Appropriate mood and affect  Laboratory: Most recent CBC Lab Results  Component Value Date   WBC 7.3 05/27/2023   HGB 8.5 (L) 05/27/2023   HCT 26.4 (L) 05/27/2023   MCV 99.2 05/27/2023   PLT 202 05/27/2023   Most recent BMP    Latest Ref Rng & Units 05/31/2023    6:25 AM  BMP  Glucose 70 - 99 mg/dL 147   BUN 8 - 23 mg/dL 43   Creatinine 8.29 - 1.24 mg/dL 5.62   Sodium 130 - 865 mmol/L 134   Potassium 3.5 - 5.1 mmol/L 4.2   Chloride 98 - 111 mmol/L 92   CO2 22 - 32 mmol/L 27   Calcium 8.9 - 10.3 mg/dL 8.8     Georg Ruddle, Sobia Karger, MD 06/01/2023, 7:46 AM  PGY-1, [place] [facility] FPTS Intern pager: [phone], text pages welcome Secure chat group [facility] Teaching Service

## 2023-06-02 DIAGNOSIS — N186 End stage renal disease: Secondary | ICD-10-CM | POA: Diagnosis not present

## 2023-06-02 LAB — CBC
HCT: 27 % — ABNORMAL LOW (ref 39.0–52.0)
Hemoglobin: 8.7 g/dL — ABNORMAL LOW (ref 13.0–17.0)
MCH: 31.8 pg (ref 26.0–34.0)
MCHC: 32.2 g/dL (ref 30.0–36.0)
MCV: 98.5 fL (ref 80.0–100.0)
Platelets: 230 10*3/uL (ref 150–400)
RBC: 2.74 MIL/uL — ABNORMAL LOW (ref 4.22–5.81)
RDW: 17.7 % — ABNORMAL HIGH (ref 11.5–15.5)
WBC: 7.3 10*3/uL (ref 4.0–10.5)
nRBC: 0 % (ref 0.0–0.2)

## 2023-06-02 LAB — RENAL FUNCTION PANEL
Albumin: 2.7 g/dL — ABNORMAL LOW (ref 3.5–5.0)
Anion gap: 8 (ref 5–15)
BUN: 68 mg/dL — ABNORMAL HIGH (ref 8–23)
CO2: 24 mmol/L (ref 22–32)
Calcium: 7.8 mg/dL — ABNORMAL LOW (ref 8.9–10.3)
Chloride: 100 mmol/L (ref 98–111)
Creatinine, Ser: 6.28 mg/dL — ABNORMAL HIGH (ref 0.61–1.24)
GFR, Estimated: 9 mL/min — ABNORMAL LOW (ref >60)
Glucose, Bld: 135 mg/dL — ABNORMAL HIGH (ref 70–99)
Phosphorus: 5.4 mg/dL — ABNORMAL HIGH (ref 2.5–4.6)
Potassium: 3.6 mmol/L (ref 3.5–5.1)
Sodium: 132 mmol/L — ABNORMAL LOW (ref 135–145)

## 2023-06-02 NOTE — Progress Notes (Signed)
 Received patient in bed to unit.  Alert and oriented.  Informed consent signed and in chart.   TX duration:2:59  Patient tolerated well.  Transported back to the room  Alert, without acute distress.  Hand-off given to patient's nurse.   Access used: AVF Access issues: n/a  Total UF removed: 1L Medication(s) given: n/a Post HD weight: 88.1KG   06/02/23 1251  Vitals  Temp (!) 97.1 F (36.2 C)  Temp Source Axillary  BP 132/63  MAP (mmHg) 83  Pulse Rate 70  ECG Heart Rate 70  Resp 15  Oxygen Therapy  SpO2 100 %  O2 Device Room Air  During Treatment Monitoring  Blood Flow Rate (mL/min) 299 mL/min  Arterial Pressure (mmHg) -200.19 mmHg  Venous Pressure (mmHg) 334.73 mmHg  TMP (mmHg) 19.59 mmHg  Ultrafiltration Rate (mL/min) 515 mL/min  Dialysate Flow Rate (mL/min) 299 ml/min  Dialysate Potassium Concentration 3  Dialysate Calcium Concentration 2.5  Duration of HD Treatment -hour(s) 2.98 hour(s)  Cumulative Fluid Removed (mL) per Treatment  990.88  HD Safety Checks Performed Yes  Intra-Hemodialysis Comments Tx completed  Post Treatment  Dialyzer Clearance Lightly streaked  Hemodialysis Intake (mL) 0 mL  Liters Processed 68.7  Fluid Removed (mL) 1000 mL  Tolerated HD Treatment Yes  Fistula / Graft Left Upper arm  No placement date or time found.   Placed prior to admission: Yes  Orientation: Left  Access Location: Upper arm  Site Condition No complications  Fistula / Graft Assessment Present;Thrill;Bruit  Status Deaccessed      [facility]

## 2023-06-02 NOTE — Progress Notes (Signed)
 OT Cancellation Note  Patient Details Name: Louis Butler MRN: [id number] DOB: 03-29-1956   Cancelled Treatment:    Reason Eval/Treat Not Completed: Patient at procedure or test/ unavailable (HD)  Carver Fila, OTD, OTR/L SecureChat Preferred Acute Rehab [phone]   Dalphine Handing 06/02/2023, 12:21 PM

## 2023-06-02 NOTE — Plan of Care (Signed)
  Problem: Health Behavior/Discharge Planning: Goal: Ability to identify and utilize available resources and services will improve Outcome: Progressing Goal: Ability to manage health-related needs will improve Outcome: Progressing   Problem: Metabolic: Goal: Ability to maintain appropriate glucose levels will improve Outcome: Progressing   Problem: Metabolic: Goal: Ability to maintain appropriate glucose levels will improve Outcome: Progressing   Problem: Nutritional: Goal: Maintenance of adequate nutrition will improve Outcome: Progressing Goal: Progress toward achieving an optimal weight will improve Outcome: Progressing

## 2023-06-02 NOTE — Assessment & Plan Note (Signed)
 No acute events overnight. - Midodrine 10 mg 3 times daily with additional 10 mg Monday Wednesday Friday during dialysis -Fludrocortisone 0.2 mg daily - Target MAP >60  - Abdominal binders, compression stockings - CTM vitals

## 2023-06-02 NOTE — Progress Notes (Signed)
    Daily Progress Note Intern Pager: [phone]  Patient name: Louis Butler Medical record number: [id number] Date of birth: Feb 22, 1956 Age: 68 y.o. Gender: male  Primary Care Provider: Latrelle Dodrill, MD Consultants: Nephrology Code Status: FULL  Pt Overview and Major Events to Date:  1/22: admitted to ICU for CRRT 1/26: stable from ICU, FMTS took over care  2/17: insurance approved LTACH   Assessment and Plan:  Louis Butler is a 68 year old male with past medical history of HTN, T2DM, now ESRD on HD.  Initially admitted to the ICU due to renal failure requiring CRRT, has been tolerating intermittent HD sessions well and is medically stable and awaiting placement for LTAC. Insurance auth resubmitted today due to expiration, [facility] still working on availability  Assessment & Plan Orthostatic hypotension No acute events overnight. - Midodrine 10 mg 3 times daily with additional 10 mg Monday Wednesday Friday during dialysis -Fludrocortisone 0.2 mg daily - Target MAP >60  - Abdominal binders, compression stockings - CTM vitals   Chronic and Stable Issues: ESRD-HD MWF, receiving today because HD full yesterday  Depressed mood-continue Zoloft HTN-holding home medications in setting of orthostasis GERD-Protonix 40 mg daily T2DM-A1c 6.8 Cancer of skin/ears/external auditory canal-stable, continue scheduled Tylenol Anemia-stable, transfuse for hemoglobin less than 7, monitoring on HD days  FEN/GI: Heart healthy, fluid restriction 1500 mL PPx: Heparin Dispo: LTAC pending bed availability, medically stable  Subjective:  Patient reports no acute events overnight.  Has no concerns at this time.  Objective: Temp:  [98 F (36.7 C)-98.3 F (36.8 C)] 98.1 F (36.7 C) (02/25 0840) Pulse Rate:  [57-76] 65 (02/25 0930) Resp:  [12-18] 15 (02/25 0930) BP: (120-145)/(53-84) 125/54 (02/25 0930) SpO2:  [97 %-100 %] 99 % (02/25 0930) Weight:  [83.1 kg] 83.1 kg (02/25 0847) Physical  Exam: General: A&O, NAD HEENT: atraumatic, normocephalic  Respiratory: normal chest rise, NWOB on RA GI: NTND Extremities:  moves extremities spontaneously  Psych: appropriate mood and affect   Laboratory: Most recent CBC Lab Results  Component Value Date   WBC 7.3 06/02/2023   HGB 8.7 (L) 06/02/2023   HCT 27.0 (L) 06/02/2023   MCV 98.5 06/02/2023   PLT 230 06/02/2023   Most recent BMP    Latest Ref Rng & Units 06/02/2023    5:03 AM  BMP  Glucose 70 - 99 mg/dL 324   BUN 8 - 23 mg/dL 68   Creatinine 4.01 - 1.24 mg/dL 0.27   Sodium 253 - 664 mmol/L 132   Potassium 3.5 - 5.1 mmol/L 3.6   Chloride 98 - 111 mmol/L 100   CO2 22 - 32 mmol/L 24   Calcium 8.9 - 10.3 mg/dL 7.8     Georg Ruddle, Raziya Aveni, MD 06/02/2023, 9:52 AM  PGY-1, [place] [facility] FPTS Intern pager: [phone], text pages welcome Secure chat group [facility] Teaching Service

## 2023-06-02 NOTE — Procedures (Signed)
 HD Note:  Some information was entered later than the data was gathered due to patient care needs. The stated time with the data is accurate.  Received patient in bed to unit.   Alert and oriented.   Informed consent signed and in chart.   Access used: Left upper arm AVF Access issues: None  Patient handed over to oncoming dialysis nurse after report given.  Jaquasia Doscher L. Dareen Piano, RN Kidney Dialysis Unit.

## 2023-06-02 NOTE — TOC Progression Note (Signed)
 Transition of Care [facility]) - Progression Note    Patient Details  Name: Louis Butler MRN: [id number] Date of Birth: 06-05-1955  Transition of Care [facility]) CM/SW Contact  Tom-Johnson, Hershal Coria, RN Phone Number: 06/02/2023, 12:49 PM  Clinical Narrative:     Resubmitted Insurance Auth approved for [facility] Patient will be going to [facility], awaiting bed availability. Possible tomorrow or Thursday per Liaison.   CM will continue to follow as patient progresses with care towards discharge.        Expected Discharge Plan: Skilled Nursing Facility Barriers to Discharge: Continued Medical Work up  Expected Discharge Plan and Services   Discharge Planning Services: CM Consult Post Acute Care Choice: Home Health Living arrangements for the past 2 months: Single Family Home Expected Discharge Date: 05/11/23               DME Arranged: Hospital bed DME Agency: [facility] Date DME Agency Contacted: 05/04/23 Time DME Agency Contacted: [phone] Representative spoke with at DME Agency: Earna Coder HH Arranged: PT, Nurse's Aide HH Agency: [facility] Date [facility] Agency Contacted: 05/04/23 Time HH Agency Contacted: [id number] Representative spoke with at [facility] Agency: Amy   Social Determinants of Health (SDOH) Interventions SDOH Screenings   Food Insecurity: No Food Insecurity (05/08/2023)  Housing: Low Risk  (05/08/2023)  Transportation Needs: No Transportation Needs (05/08/2023)  Utilities: Not At Risk (05/08/2023)  Alcohol Screen: Low Risk  (12/01/2022)  Depression (PHQ2-9): Medium Risk (12/24/2022)  Financial Resource Strain: Low Risk  (12/01/2022)  Physical Activity: Insufficiently Active (12/01/2022)  Social Connections: Moderately Integrated (05/08/2023)  Stress: Stress Concern Present (12/01/2022)  Tobacco Use: Medium Risk (04/29/2023)  Health Literacy: Adequate Health Literacy (12/01/2022)    Readmission Risk Interventions    05/04/2023    4:35 PM  Readmission Risk  Prevention Plan  Transportation Screening Complete  HRI or Home Care Consult Complete  Social Work Consult for Recovery Care Planning/Counseling Complete  Palliative Care Screening Not Applicable  Medication Review Oceanographer) Referral to Pharmacy

## 2023-06-02 NOTE — Progress Notes (Signed)
 Update provided to [facility] admissions and [facility]. If pt is approved for [facility] and bed is available, will need to f/u with clinic to see if they can hold pt's spot or if new referral will need to be submitted from LTAC. Will assist as needed.   Olivia Canter Renal Navigator [phone]

## 2023-06-02 NOTE — Progress Notes (Signed)
 Patient ID: Louis Butler, male   DOB: 02/10/1956, 68 y.o.   MRN: [id number] [facility] Progress Note   Assessment/ Plan:   1.  Deconditioning status post hospitalization: Continue with plans for rehab as able.  Orthostatic blood pressures on treatment with midodrine and florinef. LTAC pending  2. ESRD: Continue hemodialysis on MWF schedule. [facility] to resume dialysis after discharge. Off sched today, back on HD tomorrow 3. Anemia: Low but relatively stable hemoglobin and hematocrit.  Holding off on ESA with recent head/neck cancer. 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 Nutrition: Continue protein/nutritional supplementation.  Taking in large levels of salt.  Will change to heart healthy diet 6. Hypotension: ACTH stimulation test negative for adrenal insufficiency.  Midodrine dose decreased and empirically started on florinef with recurrent orthostatic drops. Droxidopa cost prohibitive.     Subjective:   Patient seen and examined on dialysis. Dialysis moved to today given high inpatient census.  No acute events. Tolerating treatment, no complaints.   Objective:   BP (!) 144/58   Pulse 65   Temp 98.1 F (36.7 C)   Resp 14   Ht 6\' 3"  (1.905 m)   Wt 83.1 kg   SpO2 100%   BMI 22.90 kg/m   Physical Exam: Gen: Lying in bed in no distress CVS: Normal rate, no rub Resp: Bilateral chest rise no increased work of breathing, CTA bl Abd: Soft, flat, nontender, bowel sounds normal Ext: No lower extremity edema.  Left upper arm AV fistula in use Neuro: awake, alert  Labs: BMET Recent Labs  Lab 05/27/23 0714 05/28/23 0746 05/29/23 0503 05/30/23 0320 05/31/23 0625 06/01/23 0950 06/02/23 0503  NA 132* 131* 136 137 134* 132* 132*  K 3.7 4.1 4.6 4.7 4.2 3.7 3.6  CL 95* 92* 94* 92* 92* 96* 100  CO2 23 27 28 30 27 23 24   GLUCOSE 153* 134* 95 103* 124* 160* 135*  BUN 71* 44* 52* 30* 43* 61* 68*  CREATININE  5.65* 4.25* 5.20* 3.57* 4.62* 5.83* 6.28*  CALCIUM 8.2* 8.7* 9.0 9.3 8.8* 8.5* 7.8*  PHOS 4.5 3.8 4.4 3.9 4.9* 4.2 5.4*   CBC Recent Labs  Lab 05/27/23 0714 06/02/23 0810  WBC 7.3 7.3  HGB 8.5* 8.7*  HCT 26.4* 27.0*  MCV 99.2 98.5  PLT 202 230      Medications:     cholecalciferol  5,000 Units Oral Once per day on Monday Thursday   fludrocortisone  0.2 mg Oral Daily   gabapentin  300 mg Oral QHS   heparin injection (subcutaneous)  5,000 Units Subcutaneous Q8H   midodrine  10 mg Oral TID WC   midodrine  10 mg Oral Q M,W,F-HD   multivitamin  1 tablet Oral QHS   pantoprazole  40 mg Oral Daily   sertraline  50 mg Oral Daily   tamsulosin  0.4 mg Oral Daily    Lian Tanori  06/02/2023, 10:53 AM

## 2023-06-02 NOTE — Progress Notes (Signed)
 Physical Therapy Treatment Patient Details Name: Louis Butler MRN: [id number] DOB: 1955-04-20 Today's Date: 06/02/2023   History of Present Illness 68 y.o. male presented 1/22 with N/V/abd pain and was found to have marked hyperkalemia and renal failure. CRRT 1/23-1/24. 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 Ricard Dillon), DM, HTN, recent squamous cell ca H&N (surgery, XRT 2024), anemia.    PT Comments  Continuing work on functional mobility and activity tolerance; today's session is interesting because I was able to see Louis Butler immediately after hemodialysis; after donning thigh high Ted hose and abdominal binder, Louis Butler was able to walk in the hallway with a Rollator walker, showing good carryover of use of Rollator brakes; he was able to identify when he could keep walking, and when he needed to stop and sit; his reported symptoms were headache, lightheadedness, and feeling nervous; noteworthy that when Louis Butler sat down, he still "bounced" his knees up and down, and he attributed to feeling nervous;   Continue to recommend an LTACH stay as a safe venue where we can increase activity tolerance, and gently push into the boundaries of his blood pressure stability; see also vital flow sheets for details and blood pressure response to activity    If plan is discharge home, recommend the following: A little help with walking and/or transfers;A little help with bathing/dressing/bathroom;Assistance with cooking/housework;Assist for transportation;Help with stairs or ramp for entrance   Can travel by private vehicle     Yes (consider leaning seat back)  Equipment Recommendations  Rollator (4 wheels)    Recommendations for Other Services       Precautions / Restrictions Precautions Precautions: Fall Recall of Precautions/Restrictions: Intact Precaution/Restrictions Comments: Monitor BP. Orthostatic - has abd binder, ace wraps, and thigh high compression stockings Restrictions Weight Bearing Restrictions Per Provider  Order: No     Mobility  Bed Mobility Overal bed mobility: Needs Assistance Bed Mobility: Supine to Sit, Sit to Supine     Supine to sit: Supervision, HOB elevated Sit to supine: Supervision, HOB elevated   General bed mobility comments: Supervision for safety, extra time, no physical assist needed    Transfers Overall transfer level: Needs assistance Equipment used: Rollator (4 wheels) Transfers: Sit to/from Stand Sit to Stand: Supervision           General transfer comment: cues for hand placement, good carryover of education on brakes;  BP does drop but pt stable    Ambulation/Gait Ambulation/Gait assistance: Contact guard assist Gait Distance (Feet): 150 Feet (+125) Assistive device: Rollator (4 wheels) Gait Pattern/deviations: Step-through pattern, Decreased stride length, Trunk flexed Gait velocity: decreased     General Gait Details: pt completed hallway amb  with rollator, improved activity tolerance, with decr syncopal symptomatology with legs wrapped. pt cued for self monitoring for need for seated rest   Stairs             Wheelchair Mobility     Tilt Bed    Modified Rankin (Stroke Patients Only)       Balance Overall balance assessment: Needs assistance Sitting-balance support: No upper extremity supported, Feet supported Sitting balance-Leahy Scale: Good Sitting balance - Comments: no LOB with static sitting, no use of UE support   Standing balance support: Bilateral upper extremity supported, During functional activity, No upper extremity supported Standing balance-Leahy Scale: Fair Standing balance comment: can static stand without UE support or walk with single UE support, mostly BUE support for endurance  Communication Communication Communication: Impaired Factors Affecting Communication: Hearing impaired (hears better out of R ear)  Cognition Arousal: Alert Behavior During Therapy: WFL for  tasks assessed/performed, Flat affect   PT - Cognitive impairments: History of cognitive impairments                       PT - Cognition Comments: Seems to be at baseline, slow to respond at times but could be due to Louis Butler playfully to [employer] references Following commands: Intact      Cueing Cueing Techniques: Verbal cues, Gestural cues  Exercises      General Comments General comments (skin integrity, edema, etc.): Pt reported symptoms of headache, light headedness when he needed to sit; noted pt tending to "bounce" knees in sitting today, which he hasn't done in previous sessions; he indicated he felt nervous      Pertinent Vitals/Pain Pain Assessment Pain Assessment: No/denies pain    Home Living                          Prior Function            PT Goals (current goals can now be found in the care plan section) Acute Rehab PT Goals Patient Stated Goal: to be able to leave his house for "fun" activity PT Goal Formulation: With patient Time For Goal Achievement: 06/04/23 Potential to Achieve Goals: Fair Progress towards PT goals: Progressing toward goals    Frequency    Min 1X/week      PT Plan      Co-evaluation              AM-PAC PT "6 Clicks" Mobility   Outcome Measure  Help needed turning from your back to your side while in a flat bed without using bedrails?: None Help needed moving from lying on your back to sitting on the side of a flat bed without using bedrails?: None Help needed moving to and from a bed to a chair (including a wheelchair)?: A Little Help needed standing up from a chair using your arms (e.g., wheelchair or bedside chair)?: A Little Help needed to walk in hospital room?: A Lot Help needed climbing 3-5 steps with a railing? : Total 6 Click Score: 17    End of Session Equipment Utilized During Treatment: Gait belt Activity Tolerance: Patient tolerated treatment well Patient left: in bed;with  call bell/phone within reach;with family/visitor present Nurse Communication: Mobility status PT Visit Diagnosis: Unsteadiness on feet (R26.81);Muscle weakness (generalized) (M62.81);Difficulty in walking, not elsewhere classified (R26.2);Dizziness and giddiness (R42)     Time: 6045-4098 PT Time Calculation (min) (ACUTE ONLY): 42 min  Charges:    $Gait Training: 23-37 mins $Therapeutic Activity: 8-22 mins PT General Charges $$ ACUTE PT VISIT: 1 Visit                     Van Clines, PT  Acute Rehabilitation Services Office [phone] Secure Chat welcomed    [name] 06/02/2023, 3:24 PM

## 2023-06-03 LAB — CBC
HCT: 27.7 % — ABNORMAL LOW (ref 39.0–52.0)
Hemoglobin: 8.9 g/dL — ABNORMAL LOW (ref 13.0–17.0)
MCH: 32.1 pg (ref 26.0–34.0)
MCHC: 32.1 g/dL (ref 30.0–36.0)
MCV: 100 fL (ref 80.0–100.0)
Platelets: 202 10*3/uL (ref 150–400)
RBC: 2.77 MIL/uL — ABNORMAL LOW (ref 4.22–5.81)
RDW: 17.6 % — ABNORMAL HIGH (ref 11.5–15.5)
WBC: 7 10*3/uL (ref 4.0–10.5)
nRBC: 0 % (ref 0.0–0.2)

## 2023-06-03 LAB — RENAL FUNCTION PANEL
Albumin: 2.9 g/dL — ABNORMAL LOW (ref 3.5–5.0)
Anion gap: 13 (ref 5–15)
BUN: 45 mg/dL — ABNORMAL HIGH (ref 8–23)
CO2: 25 mmol/L (ref 22–32)
Calcium: 8.5 mg/dL — ABNORMAL LOW (ref 8.9–10.3)
Chloride: 94 mmol/L — ABNORMAL LOW (ref 98–111)
Creatinine, Ser: 4.73 mg/dL — ABNORMAL HIGH (ref 0.61–1.24)
GFR, Estimated: 13 mL/min — ABNORMAL LOW (ref >60)
Glucose, Bld: 199 mg/dL — ABNORMAL HIGH (ref 70–99)
Phosphorus: 3.5 mg/dL (ref 2.5–4.6)
Potassium: 3.6 mmol/L (ref 3.5–5.1)
Sodium: 132 mmol/L — ABNORMAL LOW (ref 135–145)

## 2023-06-03 MED ORDER — HEPARIN SODIUM (PORCINE) 1000 UNIT/ML IJ SOLN
2000.0000 [IU] | Freq: Once | INTRAMUSCULAR | Status: AC
  Administered 2023-06-03: 2000 [IU] via INTRAVENOUS
  Filled 2023-06-03: qty 2

## 2023-06-03 NOTE — Progress Notes (Signed)
 Received patient in bed to unit.  Alert and oriented.  Informed consent signed and in chart.   TX duration: 1 hour and 9 minutes.  Patient session early under instructions of Dr. Thedore Mins due to patient's clotting of 2 cartridges and unable to finish session.    Transported back to the room  Alert, without acute distress.  Hand-off given to patient's nurse.   Access used: Left AV fistula Upper arm Access issues: see note at top  Total UF removed: - Medication(s) given: Heparin 4000, midodrine   06/03/23 1107  Vitals  Temp 97.9 F (36.6 C)  Temp Source Oral  BP 134/61  MAP (mmHg) 79  Pulse Rate (!) 58  ECG Heart Rate 67  Resp 16  Oxygen Therapy  SpO2 100 %  O2 Device Room Air  During Treatment Monitoring  HD Safety Checks Performed Yes  Intra-Hemodialysis Comments See progress note (Treatment terminated due to 3rd cartridge clotting off per Dr. Doristine Church order)  Dialysis Fluid Bolus Normal Saline  Bolus Amount (mL) 300 mL  Post Treatment  Dialyzer Clearance Lightly streaked  Liters Processed 26.2  Fluid Removed (mL) -500 mL  Tolerated HD Treatment No (Comment)  Post-Hemodialysis Comments patient's cartridge clotted off 2 times.  Dr. Thedore Mins aware and instructed me to end treament due to clotting.  AVG/AVF Arterial Site Held (minutes) 7 minutes  AVG/AVF Venous Site Held (minutes) 7 minutes  Fistula / Graft Left Upper arm  No placement date or time found.   Placed prior to admission: Yes  Orientation: Left  Access Location: Upper arm  Status Deaccessed     Stacie Glaze LPN Kidney Dialysis Unit

## 2023-06-03 NOTE — Progress Notes (Signed)
 Occupational Therapy Treatment Patient Details Name: Louis Butler MRN: [id number] DOB: 1955-12-01 Today's Date: 06/03/2023   History of present illness 68 y.o. male presented 1/22 with N/V/abd pain and was found to have marked hyperkalemia and renal failure. CRRT 1/23-1/24. 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 Ricard Dillon), DM, HTN, recent squamous cell ca H&N (surgery, XRT 2024), anemia.   OT comments  Pt progressing toward goals this session, needing CGA- mod A for ADLs, supervision for bed mobility and CGA for transfers with RW. Pt needing x1 seated rest break during session (see BP below). Pt presenting with impairments listed below, will follow acutely. Recommend LTACH at d/c.   BP supine 134/68 (89) BP seated 133/60 (83) BP standing 106/63 (72) BP seated after hall walk 122/64 (83)      If plan is discharge home, recommend the following:  A little help with walking and/or transfers;Assistance with cooking/housework;Assist for transportation;Help with stairs or ramp for entrance;A lot of help with bathing/dressing/bathroom   Equipment Recommendations  Hospital bed;Other (comment) (tollator)    Recommendations for Other Services      Precautions / Restrictions Precautions Precautions: Fall Recall of Precautions/Restrictions: Intact Precaution/Restrictions Comments: Monitor BP. Orthostatic - has abd binder, ace wraps, and thigh high compression stockings Restrictions Weight Bearing Restrictions Per Provider Order: No       Mobility Bed Mobility Overal bed mobility: Needs Assistance Bed Mobility: Supine to Sit, Sit to Supine     Supine to sit: Supervision, HOB elevated Sit to supine: Supervision, HOB elevated   General bed mobility comments: Supervision for safety, extra time, no physical assist needed    Transfers Overall transfer level: Needs assistance Equipment used: Rolling walker (2 wheels) (rollator unavailable) Transfers: Sit to/from Stand Sit to Stand: Contact guard  assist                 Balance Overall balance assessment: Needs assistance Sitting-balance support: No upper extremity supported, Feet supported Sitting balance-Leahy Scale: Good Sitting balance - Comments: no LOB with static sitting, no use of UE support   Standing balance support: Bilateral upper extremity supported, During functional activity, No upper extremity supported Standing balance-Leahy Scale: Fair Standing balance comment: can static stand without UE support or walk with single UE support, mostly BUE support for endurance                           ADL either performed or assessed with clinical judgement   ADL Overall ADL's : Needs assistance/impaired                 Upper Body Dressing : Minimal assistance;Sitting   Lower Body Dressing: Moderate assistance Lower Body Dressing Details (indicate cue type and reason): able to don/doff socks ind, max A for ted hose Toilet Transfer: Contact guard assist;Rolling walker (2 wheels)           Functional mobility during ADLs: Contact guard assist;Rolling walker (2 wheels)      Extremity/Trunk Assessment Upper Extremity Assessment Upper Extremity Assessment: Overall WFL for tasks assessed   Lower Extremity Assessment Lower Extremity Assessment: Defer to PT evaluation        Vision       Perception Perception Perception: Not tested   Praxis Praxis Praxis: Not tested   Communication Communication Communication: Impaired Factors Affecting Communication: Hearing impaired (hears better out of R ear)   Cognition Arousal: Alert Behavior During Therapy: WFL for tasks assessed/performed, Flat affect Cognition: No apparent  impairments                               Following commands: Intact        Cueing   Cueing Techniques: Verbal cues, Gestural cues  Exercises      Shoulder Instructions       General Comments VSS see note for BPs    Pertinent Vitals/ Pain        Pain Assessment Pain Assessment: No/denies pain  Home Living                                          Prior Functioning/Environment              Frequency  Min 1X/week        Progress Toward Goals  OT Goals(current goals can now be found in the care plan section)  Progress towards OT goals: Progressing toward goals  Acute Rehab OT Goals OT Goal Formulation: With patient Time For Goal Achievement: 06/17/23 Potential to Achieve Goals: Good ADL Goals Pt Will Perform Lower Body Dressing: with supervision;sitting/lateral leans;sit to/from stand Additional ADL Goal #1: pt will tolerate OOB activity x10 min with BP stable in prep for standing ADLs  Plan      Co-evaluation                 AM-PAC OT "6 Clicks" Daily Activity     Outcome Measure   Help from another person eating meals?: None Help from another person taking care of personal grooming?: A Little Help from another person toileting, which includes using toliet, bedpan, or urinal?: A Lot Help from another person bathing (including washing, rinsing, drying)?: A Lot Help from another person to put on and taking off regular upper body clothing?: A Little Help from another person to put on and taking off regular lower body clothing?: Total 6 Click Score: 15    End of Session Equipment Utilized During Treatment: Rolling walker (2 wheels);Gait belt  OT Visit Diagnosis: Unsteadiness on feet (R26.81);Muscle weakness (generalized) (M62.81)   Activity Tolerance Patient tolerated treatment well   Patient Left in bed;with call bell/phone within reach;with bed alarm set;with family/visitor present   Nurse Communication Mobility status        Time: 9604-5409 OT Time Calculation (min): 33 min  Charges: OT General Charges $OT Visit: 1 Visit OT Treatments $Self Care/Home Management : 8-22 mins $Therapeutic Activity: 8-22 mins  Carver Fila, OTD, OTR/L SecureChat Preferred Acute  Rehab [phone]   [name] 06/03/2023, 3:37 PM

## 2023-06-03 NOTE — Assessment & Plan Note (Signed)
 No acute events overnight.  Patient believes this has been improving since he has tolerated dialysis better. - Midodrine 10 mg 3 times daily with additional 10 mg Monday Wednesday Friday during dialysis -Fludrocortisone 0.2 mg daily - Target MAP >60  - Abdominal binders, compression stockings - CTM vitals  - RFP on dialysis days

## 2023-06-03 NOTE — Progress Notes (Signed)
    Daily Progress Note Intern Pager: [phone]  Patient name: Louis Butler Medical record number: [id number] Date of birth: 1955/08/13 Age: 68 y.o. Gender: male  Primary Care Provider: Latrelle Dodrill, MD Consultants: Nephrology Code Status: FULL  Pt Overview and Major Events to Date:  1/22: admitted to ICU for CRRT 1/26: stable from ICU, FMTS took over care  2/17: insurance approved LTACH   Assessment and Plan:  Louis Butler is a 68 year old male with past medical history of HTN, T2DM, now ESRD on HD.  Initially admitted to the ICU due to renal failure requiring CRRT, has been tolerating intermittent HD sessions well and is medically stable and awaiting placement for LTAC. Assessment & Plan Orthostatic hypotension No acute events overnight.  Patient believes this has been improving since he has tolerated dialysis better. - Midodrine 10 mg 3 times daily with additional 10 mg Monday Wednesday Friday during dialysis -Fludrocortisone 0.2 mg daily - Target MAP >60  - Abdominal binders, compression stockings - CTM vitals  - RFP on dialysis days   Chronic and Stable Issues: ESRD-HD MWF Depressed mood-continue Zoloft HTN-holding home medications in setting of orthostasis GERD-Protonix 40 mg daily T2DM-A1c 6.8 Cancer of skin/ears/external auditory canal-stable, continue scheduled Tylenol Anemia-stable, transfuse for hemoglobin less than 7, monitoring on HD days Neuropathy: continue gabapentin 300 mg daily at bedtime  FEN/GI: Heart healthy, fluid restriction 1500 mL PPx: Heparin Dispo: LTAC pending bed availability, medically stable  Subjective:  Patient reports no acute events overnight, does not have any concerns at this time.  Objective: Temp:  [97.1 F (36.2 C)-98.1 F (36.7 C)] 98 F (36.7 C) (02/25 2100) Pulse Rate:  [57-75] 72 (02/26 0535) Resp:  [12-18] 18 (02/26 0535) BP: (117-144)/(53-68) 122/56 (02/26 0535) SpO2:  [94 %-100 %] 99 % (02/26 0535) Weight:   [83.1 kg-88.1 kg] 88.1 kg (02/25 1253) Physical Exam: General: Comfortable in bed, in NAD HEENT: Atraumatic normocephalic Respiratory: Normal chest rhythm, normal work of breathing on room air GI: Nondistended Extremities: Moves all 4 extremities spontaneously Psych: Appropriate mood  Laboratory: Most recent CBC Lab Results  Component Value Date   WBC 7.3 06/02/2023   HGB 8.7 (L) 06/02/2023   HCT 27.0 (L) 06/02/2023   MCV 98.5 06/02/2023   PLT 230 06/02/2023   Most recent BMP    Latest Ref Rng & Units 06/02/2023    5:03 AM  BMP  Glucose 70 - 99 mg/dL 147   BUN 8 - 23 mg/dL 68   Creatinine 8.29 - 1.24 mg/dL 5.62   Sodium 130 - 865 mmol/L 132   Potassium 3.5 - 5.1 mmol/L 3.6   Chloride 98 - 111 mmol/L 100   CO2 22 - 32 mmol/L 24   Calcium 8.9 - 10.3 mg/dL 7.8     Georg Ruddle, Caylin Raby, MD 06/03/2023, 7:52 AM  PGY-1, [facility] FPTS Intern pager: [phone], text pages welcome Secure chat group [facility] Teaching Service

## 2023-06-03 NOTE — Progress Notes (Signed)
 Patient ID: Louis Butler, male   DOB: [birth year], 68 y.o.   MRN: [id number] [facility] Progress Note   Assessment/ Plan:   1.  Deconditioning status post hospitalization: Continue with plans for rehab as able.  Orthostatic blood pressures on treatment with midodrine and florinef. LTAC pending  2. ESRD: Continue hemodialysis on MWF schedule. [facility] to [facility] dialysis after discharge from [facility] however may not be able to hold spot while he's at [facility]. Will need heparin with HD 3. Anemia: Low but relatively stable hemoglobin and hematocrit.  Holding off on ESA with recent head/neck cancer. Transfuse prn. Repeat Fe panel 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 Nutrition: Continue protein/nutritional supplementation.  Renal diet 6. Hypotension: ACTH stimulation test negative for adrenal insufficiency.  Midodrine dose decreased and empirically started on florinef with recurrent orthostatic drops. Droxidopa cost prohibitive.   Dispo: pending bed at [facility]    Subjective:   Patient seen and examined bedside. Frequent cartridge clotting with HD earlier, ran only about 1hr 9 min. Wife at bedside, she is requesting his HPV to be tested given history of his head and neck ca, passed along to primary service.   Objective:   BP 138/61   Pulse 66   Temp 97.9 F (36.6 C) (Oral)   Resp 15   Ht 6\' 3"  (1.905 m)   Wt 84 kg   SpO2 100%   BMI 23.15 kg/m   Physical Exam: Gen: Lying in bed in no distress CVS: Normal rate, no rub Resp: Bilateral chest rise no increased work of breathing, CTA bl Abd: Soft, flat, nontender, bowel sounds normal Ext: No lower extremity edema.  Left upper arm AV fistula Neuro: awake, alert  Labs: BMET Recent Labs  Lab 05/28/23 0746 05/29/23 0503 05/30/23 0320 05/31/23 0625 06/01/23 0950 06/02/23 0503  NA 131* 136 137 134* 132* 132*  K 4.1 4.6 4.7 4.2 3.7 3.6  CL 92* 94* 92* 92* 96* 100   CO2 27 28 30 27 23 24   GLUCOSE 134* 95 103* 124* 160* 135*  BUN 44* 52* 30* 43* 61* 68*  CREATININE 4.25* 5.20* 3.57* 4.62* 5.83* 6.28*  CALCIUM 8.7* 9.0 9.3 8.8* 8.5* 7.8*  PHOS 3.8 4.4 3.9 4.9* 4.2 5.4*   CBC Recent Labs  Lab 06/02/23 0810 06/03/23 0830  WBC 7.3 7.0  HGB 8.7* 8.9*  HCT 27.0* 27.7*  MCV 98.5 100.0  PLT 230 202      Medications:     cholecalciferol  5,000 Units Oral Once per day on Monday Thursday   fludrocortisone  0.2 mg Oral Daily   gabapentin  300 mg Oral QHS   heparin injection (subcutaneous)  5,000 Units Subcutaneous Q8H   midodrine  10 mg Oral TID WC   midodrine  10 mg Oral Q M,W,F-HD   multivitamin  1 tablet Oral QHS   pantoprazole  40 mg Oral Daily   sertraline  50 mg Oral Daily   tamsulosin  0.4 mg Oral Daily    Tery Hoeger  06/03/2023, 12:30 PM

## 2023-06-03 NOTE — Progress Notes (Addendum)
 [facility] advised of pt's d/c plan for d/c to [facility] when bed available. Inquired of clinic if pt's appt can be held while in [facility] or if referral will need to be canceled and [facility] staff make referral when d/c date is known. Will await response from clinic. Will assist as needed.   Olivia Canter Renal Navigator [phone]  Addendum at 12:27 pm: If/when pt is d/c to [facility], referral for [facility] will need to be canceled per clinic staff. Clinic cannot hold spot for an unknown amount of time.

## 2023-06-04 DIAGNOSIS — N186 End stage renal disease: Secondary | ICD-10-CM | POA: Diagnosis not present

## 2023-06-04 DIAGNOSIS — N179 Acute kidney failure, unspecified: Secondary | ICD-10-CM | POA: Diagnosis not present

## 2023-06-04 DIAGNOSIS — N189 Chronic kidney disease, unspecified: Secondary | ICD-10-CM | POA: Diagnosis not present

## 2023-06-04 DIAGNOSIS — E871 Hypo-osmolality and hyponatremia: Secondary | ICD-10-CM

## 2023-06-04 NOTE — Plan of Care (Signed)
  Problem: Health Behavior/Discharge Planning: Goal: Ability to identify and utilize available resources and services will improve Outcome: Progressing Goal: Ability to manage health-related needs will improve Outcome: Progressing   Problem: Nutritional: Goal: Maintenance of adequate nutrition will improve Outcome: Progressing Goal: Progress toward achieving an optimal weight will improve Outcome: Progressing   Problem: Skin Integrity: Goal: Risk for impaired skin integrity will decrease Outcome: Progressing   Problem: Education: Goal: Knowledge of General Education information will improve Description: Including pain rating scale, medication(s)/side effects and non-pharmacologic comfort measures Outcome: Progressing   Problem: Health Behavior/Discharge Planning: Goal: Ability to manage health-related needs will improve Outcome: Progressing   Problem: Clinical Measurements: Goal: Ability to maintain clinical measurements within normal limits will improve Outcome: Progressing Goal: Will remain free from infection Outcome: Progressing Goal: Diagnostic test results will improve Outcome: Progressing Goal: Respiratory complications will improve Outcome: Progressing Goal: Cardiovascular complication will be avoided Outcome: Progressing   Problem: Activity: Goal: Risk for activity intolerance will decrease Outcome: Progressing   Problem: Nutrition: Goal: Adequate nutrition will be maintained Outcome: Progressing   Problem: Coping: Goal: Level of anxiety will decrease Outcome: Progressing   Problem: Elimination: Goal: Will not experience complications related to bowel motility Outcome: Progressing Goal: Will not experience complications related to urinary retention Outcome: Progressing   Problem: Pain Managment: Goal: General experience of comfort will improve and/or be controlled Outcome: Progressing   Problem: Safety: Goal: Ability to remain free from injury will  improve Outcome: Progressing   Problem: Skin Integrity: Goal: Risk for impaired skin integrity will decrease Outcome: Progressing   Problem: Education: Goal: Knowledge of disease and its progression will improve Outcome: Progressing   Problem: Fluid Volume: Goal: Compliance with measures to maintain balanced fluid volume will improve Outcome: Progressing   Problem: Health Behavior/Discharge Planning: Goal: Ability to manage health-related needs will improve Outcome: Progressing   Problem: Nutritional: Goal: Ability to make healthy dietary choices will improve Outcome: Progressing   Problem: Clinical Measurements: Goal: Complications related to the disease process, condition or treatment will be avoided or minimized Outcome: Progressing

## 2023-06-04 NOTE — Plan of Care (Signed)
  Problem: Health Behavior/Discharge Planning: Goal: Ability to identify and utilize available resources and services will improve Outcome: Completed/Met Goal: Ability to manage health-related needs will improve Outcome: Completed/Met

## 2023-06-04 NOTE — Progress Notes (Signed)
 PT Cancellation Note  Patient Details Name: Louis Butler MRN: [id number] DOB: July 01, 1955   Cancelled Treatment:    Reason Eval/Treat Not Completed: Other (comment) Pt received asleep in room, lights off, family member in room requesting to let pt rest at this time as he walked earlier today. Will f/u as able.  Aleda Grana, PT, DPT 06/04/23, 3:43 PM   Sandi Mariscal 06/04/2023, 3:43 PM

## 2023-06-04 NOTE — Progress Notes (Signed)
 Patient ID: Louis Butler, male   DOB: 03/09/1956, 68 y.o.   MRN: [id number] [facility] Progress Note   Assessment/ Plan:   1.  Deconditioning status post hospitalization: Continue with plans for rehab as able.  Orthostatic blood pressures on treatment with midodrine and florinef. LTAC pending  2. ESRD: Continue hemodialysis on MWF schedule. [facility] to resume dialysis after discharge from [facility] however may not be able to hold spot while he's at [facility]. Will need heparin with HD 3. Anemia: Low but relatively stable hemoglobin and hematocrit.  Holding off on ESA with recent head/neck cancer. Transfuse prn. Repeat Fe panel 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 Nutrition: Continue protein/nutritional supplementation.  Renal diet 6. Hypotension: ACTH stimulation test negative for adrenal insufficiency.  Midodrine dose decreased and empirically started on florinef with recurrent orthostatic drops. Droxidopa cost prohibitive.   Dispo: pending bed at [facility]    Subjective:   Patient seen and examined bedside. No complaints, no acute events. Waiting on bed at [facility] still.   Objective:   BP (!) 145/61 (BP Location: Right Arm)   Pulse 81   Temp 98.1 F (36.7 C)   Resp 18   Ht 6\' 3"  (1.905 m)   Wt 84 kg   SpO2 96%   BMI 23.15 kg/m   Physical Exam: Gen: NAD CVS: Normal rate Resp: Bilateral chest rise no increased work of breathing Abd: Soft, flat, nontender, bowel sounds normal Ext: No lower extremity edema.  Left upper arm AV fistula Neuro: awake, alert  Labs: BMET Recent Labs  Lab 05/29/23 0503 05/30/23 0320 05/31/23 0625 06/01/23 0950 06/02/23 0503 06/03/23 0800  NA 136 137 134* 132* 132* 132*  K 4.6 4.7 4.2 3.7 3.6 3.6  CL 94* 92* 92* 96* 100 94*  CO2 28 30 27 23 24 25   GLUCOSE 95 103* 124* 160* 135* 199*  BUN 52* 30* 43* 61* 68* 45*  CREATININE 5.20* 3.57* 4.62* 5.83* 6.28* 4.73*  CALCIUM  9.0 9.3 8.8* 8.5* 7.8* 8.5*  PHOS 4.4 3.9 4.9* 4.2 5.4* 3.5   CBC Recent Labs  Lab 06/02/23 0810 06/03/23 0830  WBC 7.3 7.0  HGB 8.7* 8.9*  HCT 27.0* 27.7*  MCV 98.5 100.0  PLT 230 202      Medications:     cholecalciferol  5,000 Units Oral Once per day on Monday Thursday   fludrocortisone  0.2 mg Oral Daily   gabapentin  300 mg Oral QHS   heparin injection (subcutaneous)  5,000 Units Subcutaneous Q8H   midodrine  10 mg Oral TID WC   midodrine  10 mg Oral Q M,W,F-HD   multivitamin  1 tablet Oral QHS   pantoprazole  40 mg Oral Daily   sertraline  50 mg Oral Daily   tamsulosin  0.4 mg Oral Daily    Louis Butler  06/04/2023, 10:32 AM

## 2023-06-04 NOTE — Progress Notes (Signed)
 Mobility Specialist Progress Note:   06/04/23 0900  Orthostatic Lying   BP- Lying 143/63  Orthostatic Sitting  BP- Sitting 131/75  Orthostatic Standing at 0 minutes  BP- Standing at 0 minutes 122/64  Orthostatic Standing at 3 minutes  BP- Standing at 3 minutes 131/66  Mobility  Activity Ambulated with assistance in hallway  Level of Assistance Contact guard assist, steadying assist  Assistive Device Front wheel walker  Distance Ambulated (ft) 70 ft  Activity Response Tolerated well  Mobility Referral Yes  Mobility visit 1 Mobility  Mobility Specialist Start Time (ACUTE ONLY) [id number]  Mobility Specialist Stop Time (ACUTE ONLY) 0854  Mobility Specialist Time Calculation (min) (ACUTE ONLY) 25 min    Post Mobility:   131/66 (85) BP  Pt received in bed and agreeable. MS donned ted hose and abdominal binder. Orthostatic vitals taken, see above. Pt asymptomatic w/ no complaints throughout. Returned to room w/o fault. Pt left in bed w/ call bell and all needs met. Family present.  D'Vante Earlene Plater Mobility Specialist Please contact via Special educational needs teacher or Rehab office at [phone]

## 2023-06-04 NOTE — Assessment & Plan Note (Signed)
 No acute events overnight patient appears to have been orthostatic with Occupational Therapy yesterday, which is to be expected. - Midodrine 10 mg 3 times daily with additional 10 mg Monday Wednesday Friday during dialysis -Fludrocortisone 0.2 mg daily - Target MAP >60  - Abdominal binders, compression stockings - CTM vitals  - RFP on dialysis days

## 2023-06-04 NOTE — Progress Notes (Signed)
 Patient refused foley catheter exchange by this RN and requested urology to do the exchange. He said he had a lot of pain the last time his foley was changed. Hospitalist made aware. Per MD they will discuss the foley exchange with him.

## 2023-06-04 NOTE — Progress Notes (Signed)
     Daily Progress Note Intern Pager: [phone]  Patient name: Louis Butler Medical record number: [id number] Date of birth: 23-Mar-1956 Age: 68 y.o. Gender: male  Primary Care Provider: Latrelle Dodrill, MD Consultants: Nephrology, CCM Code Status: Full  Pt Overview and Major Events to Date:  1/22: admitted to ICU for CRRT 1/26: stable from ICU, FMTS took over care  2/17: insurance approved LTACH  Assessment and Plan: Patient is a 68 year old male with past medical history of HTN, T2DM, now ESRD on HD.  Patient was initially admitted to the ICU due to renal failure requiring CRRT, and was transitioned to FMTS on 1/26.  Patient has remained stable since then and is now awaiting LTAC placement  Assessment & Plan Orthostatic hypotension No acute events overnight patient appears to have been orthostatic with Occupational Therapy yesterday, which is to be expected. - Midodrine 10 mg 3 times daily with additional 10 mg Monday Wednesday Friday during dialysis -Fludrocortisone 0.2 mg daily - Target MAP >60  - Abdominal binders, compression stockings - CTM vitals  - RFP on dialysis days   Chronic and stable: Bilateral Hydronephrosis/Obstruction: Patient still has foley in per urology recs, will replace today and patient will need urology follow up outpatient to assess ability to remove  ESRD-HD MWF Depressed mood-continue Zoloft HTN-holding home medications in setting of orthostasis GERD-Protonix 40 mg daily T2DM-A1c 6.8 Cancer of skin/ears/external auditory canal-stable, continue scheduled Tylenol Anemia-stable, transfuse for hemoglobin less than 7, monitoring on HD days Neuropathy: continue gabapentin 300 mg daily at bedtime  FEN/GI: Heart healthy, fluid restriction 1500 mL PPx: Heparin Dispo: LTAC awaiting placement, medically stable for discharge  Subjective:  Patient reports doing well, no acute complaints.  Objective: Temp:  [97.9 F (36.6 C)-98.1 F (36.7 C)]  98.1 F (36.7 C) (02/27 0528) Pulse Rate:  [58-80] 80 (02/27 0528) Resp:  [11-18] 18 (02/27 0528) BP: (109-148)/(52-93) 140/58 (02/27 0528) SpO2:  [92 %-100 %] 97 % (02/27 0528) Weight:  [84 kg-84.4 kg] 84 kg (02/26 1119) Physical Exam: General: Generally well-appearing, NAD Cardiovascular: Regular rate and rhythm Respiratory: Normal chest rise, normal work of breathing on room air  Extremities: No lower extremity edema noted  Laboratory: Most recent CBC Lab Results  Component Value Date   WBC 7.0 06/03/2023   HGB 8.9 (L) 06/03/2023   HCT 27.7 (L) 06/03/2023   MCV 100.0 06/03/2023   PLT 202 06/03/2023   Most recent BMP    Latest Ref Rng & Units 06/03/2023    8:00 AM  BMP  Glucose 70 - 99 mg/dL 147   BUN 8 - 23 mg/dL 45   Creatinine 8.29 - 1.24 mg/dL 5.62   Sodium 130 - 865 mmol/L 132   Potassium 3.5 - 5.1 mmol/L 3.6   Chloride 98 - 111 mmol/L 94   CO2 22 - 32 mmol/L 25   Calcium 8.9 - 10.3 mg/dL 8.5     Imaging/Diagnostic Tests: No new images Georg Ruddle Coburn Knaus, MD 06/04/2023, 7:45 AM  PGY-1, [facility] FPTS Intern pager: [phone], text pages welcome Secure chat group [facility] Teaching Service

## 2023-06-04 NOTE — TOC Progression Note (Signed)
 Transition of Care [facility]) - Progression Note    Patient Details  Name: Louis Butler MRN: [id number] Date of Birth: 19-May-1955  Transition of Care [facility]) CM/SW Contact  Tom-Johnson, Hershal Coria, RN Phone Number: 06/04/2023, 3:05 PM  Clinical Narrative:     Awaiting bed at [facility]. Continues inpatient HD.   CM will continue to follow.         Expected Discharge Plan: Skilled Nursing Facility Barriers to Discharge: Continued Medical Work up  Expected Discharge Plan and Services   Discharge Planning Services: CM Consult Post Acute Care Choice: Home Health Living arrangements for the past 2 months: Single Family Home Expected Discharge Date: 05/11/23               DME Arranged: Hospital bed DME Agency: [employer] Date DME Agency Contacted: 05/04/23 Time DME Agency Contacted: [phone] Representative spoke with at DME Agency: Earna Coder HH Arranged: PT, Nurse's Aide HH Agency: [facility] Date [facility] Agency Contacted: 05/04/23 Time HH Agency Contacted: [id number] Representative spoke with at [facility] Agency: Amy   Social Determinants of Health (SDOH) Interventions SDOH Screenings   Food Insecurity: No Food Insecurity (05/08/2023)  Housing: Low Risk  (05/08/2023)  Transportation Needs: No Transportation Needs (05/08/2023)  Utilities: Not At Risk (05/08/2023)  Alcohol Screen: Low Risk  (12/01/2022)  Depression (PHQ2-9): Medium Risk (12/24/2022)  Financial Resource Strain: Low Risk  (12/01/2022)  Physical Activity: Insufficiently Active (12/01/2022)  Social Connections: Moderately Integrated (05/08/2023)  Stress: Stress Concern Present (12/01/2022)  Tobacco Use: Medium Risk (04/29/2023)  Health Literacy: Adequate Health Literacy (12/01/2022)    Readmission Risk Interventions    05/04/2023    4:35 PM  Readmission Risk Prevention Plan  Transportation Screening Complete  HRI or Home Care Consult Complete  Social Work Consult for Recovery Care Planning/Counseling Complete  Palliative  Care Screening Not Applicable  Medication Review Oceanographer) Referral to Pharmacy

## 2023-06-05 DIAGNOSIS — N186 End stage renal disease: Secondary | ICD-10-CM | POA: Diagnosis not present

## 2023-06-05 LAB — GLUCOSE, CAPILLARY: Glucose-Capillary: 120 mg/dL — ABNORMAL HIGH (ref 70–99)

## 2023-06-05 LAB — RENAL FUNCTION PANEL
Albumin: 2.8 g/dL — ABNORMAL LOW (ref 3.5–5.0)
Anion gap: 14 (ref 5–15)
BUN: 62 mg/dL — ABNORMAL HIGH (ref 8–23)
CO2: 21 mmol/L — ABNORMAL LOW (ref 22–32)
Calcium: 8.2 mg/dL — ABNORMAL LOW (ref 8.9–10.3)
Chloride: 96 mmol/L — ABNORMAL LOW (ref 98–111)
Creatinine, Ser: 5.39 mg/dL — ABNORMAL HIGH (ref 0.61–1.24)
GFR, Estimated: 11 mL/min — ABNORMAL LOW (ref >60)
Glucose, Bld: 171 mg/dL — ABNORMAL HIGH (ref 70–99)
Phosphorus: 3.3 mg/dL (ref 2.5–4.6)
Potassium: 3.1 mmol/L — ABNORMAL LOW (ref 3.5–5.1)
Sodium: 131 mmol/L — ABNORMAL LOW (ref 135–145)

## 2023-06-05 LAB — IRON AND TIBC
Iron: 30 ug/dL — ABNORMAL LOW (ref 45–182)
Saturation Ratios: 14 % — ABNORMAL LOW (ref 17.9–39.5)
TIBC: 220 ug/dL — ABNORMAL LOW (ref 250–450)
UIBC: 190 ug/dL

## 2023-06-05 LAB — FERRITIN: Ferritin: 244 ng/mL (ref 24–336)

## 2023-06-05 MED ORDER — HEPARIN SODIUM (PORCINE) 1000 UNIT/ML IJ SOLN
INTRAMUSCULAR | Status: AC
  Administered 2023-06-05: 1000 [IU]
  Filled 2023-06-05: qty 4

## 2023-06-05 MED ORDER — LIDOCAINE HCL URETHRAL/MUCOSAL 2 % EX GEL
1.0000 | Freq: Once | CUTANEOUS | Status: DC
Start: 2023-06-05 — End: 2023-06-13
  Filled 2023-06-05: qty 6

## 2023-06-05 NOTE — Progress Notes (Signed)
     Daily Progress Note Intern Pager: [phone]  Patient name: Louis Butler Medical record number: [id number] Date of birth: 06/21/1955 Age: 68 y.o. Gender: male  Primary Care Provider: Latrelle Dodrill, MD Consultants: Nephrology, CCM Code Status: Full   Pt Overview and Major Events to Date:  1/22: admitted to ICU for CRRT 1/26: stable from ICU, FMTS took over care  2/17: insurance approved LTACH   Assessment and Plan: Patient is a 68 year old male with past medical history of HTN, T2DM, now ESRD on HD.  Patient was initially admitted to the ICU due to renal failure requiring CRRT, and was transitioned to FMTS on 1/26.  Patient has remained stable since then and is now awaiting LTAC placement Assessment & Plan Orthostatic hypotension No acute events overnight patient appears to have been orthostatic with Occupational Therapy yesterday, which is to be expected. - Midodrine 10 mg 3 times daily with additional 10 mg Monday Wednesday Friday during dialysis -Fludrocortisone 0.2 mg daily - Target MAP >60  - Abdominal binders, compression stockings - CTM vitals  - RFP on dialysis days   Chronic and Stable Issues: Bilateral Hydronephrosis/Obstruction: patient refused foley replacement with nursing yesterday. Discussed with patient today. He is again refusing foley replacement unless someone from urology comes to see him. Discussed with Sheria Lang, NP with urology who will come and speak with patient today.    ESRD-HD MWF Depressed mood-continue Zoloft HTN-holding home medications in setting of orthostasis GERD-Protonix 40 mg daily T2DM-A1c 6.8, will need CBG monitoring and adjustment to insulin regimen outpatient  Cancer of skin/ears/external auditory canal-stable, continue scheduled Tylenol Anemia-stable, transfuse for hemoglobin less than 7, monitoring on HD days Neuropathy: continue gabapentin 300 mg daily at bedtime   FEN/GI: Heart healthy, fluid restriction 1500 mL PPx:  Heparin Dispo: LTAC awaiting placement, medically stable for discharge  Subjective:  Eating breakfast, no acute concerns. Declines foley replacement.   Objective: Temp:  [98.4 F (36.9 C)-98.8 F (37.1 C)] 98.4 F (36.9 C) (02/28 0542) Pulse Rate:  [80-90] 80 (02/28 0542) Resp:  [16-18] 18 (02/28 0542) BP: (124-153)/(59-70) 124/60 (02/28 0542) SpO2:  [97 %-99 %] 97 % (02/28 0542) Weight:  [84.1 kg] 84.1 kg (02/28 0249) Physical Exam: General: NAD, eating breakfast  Cardiovascular: RRR Respiratory: NWOB on RA Abdomen: soft  Laboratory: Most recent CBC Lab Results  Component Value Date   WBC 7.0 06/03/2023   HGB 8.9 (L) 06/03/2023   HCT 27.7 (L) 06/03/2023   MCV 100.0 06/03/2023   PLT 202 06/03/2023   Most recent BMP    Latest Ref Rng & Units 06/03/2023    8:00 AM  BMP  Glucose 70 - 99 mg/dL 284   BUN 8 - 23 mg/dL 45   Creatinine 1.32 - 1.24 mg/dL 4.40   Sodium 102 - 725 mmol/L 132   Potassium 3.5 - 5.1 mmol/L 3.6   Chloride 98 - 111 mmol/L 94   CO2 22 - 32 mmol/L 25   Calcium 8.9 - 10.3 mg/dL 8.5     Imaging/Diagnostic Tests: No new imaging Penne Lash, MD 06/05/2023, 8:16 AM  PGY-1, [facility] FPTS Intern pager: [phone], text pages welcome Secure chat group [facility] Teaching Service

## 2023-06-05 NOTE — Assessment & Plan Note (Signed)
 No acute events overnight patient appears to have been orthostatic with Occupational Therapy yesterday, which is to be expected. - Midodrine 10 mg 3 times daily with additional 10 mg Monday Wednesday Friday during dialysis -Fludrocortisone 0.2 mg daily - Target MAP >60  - Abdominal binders, compression stockings - CTM vitals  - RFP on dialysis days

## 2023-06-05 NOTE — Progress Notes (Addendum)
 Physical Therapy Treatment Patient Details Name: Louis Butler MRN: [id number] DOB: [birth year] Today's Date: 06/05/2023   History of Present Illness 68 y.o. male presented 1/22 with N/V/abd pain and was found to have marked hyperkalemia and renal failure. CRRT 1/23-1/24. 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 Ricard Dillon), DM, HTN, recent squamous cell ca H&N (surgery, XRT 2024), anemia.    PT Comments  Pt seen for PT tx with pt received in bed. Pt irritable throughout session, impolite to therapist. PT educates pt on importance of sitting in recliner at end of session with pt reporting it won't do any good but PT educating him on benefits to BP & overall activity tolerance with pt reporting "I guess I won't get better then". PT assisted pt with donning BLE ted hose, ace wraps, & pt required assistance after extra time trying to problem solve how to don abdominal binder. Pt able to stand EOB with supervision in attempts to don binder. Upon returning to sitting pt asking "aren't you going to check my BP" with PT educating him on potential to mobilize based on symptoms with pt stating "you're the first person to refuse to check my BP" with PT offering to go get dinamp with pt becoming very irritated stating "get the f**k out of my room & don't come back". Pt assisted back to bed, nurse made aware.     If plan is discharge home, recommend the following: A little help with walking and/or transfers;A little help with bathing/dressing/bathroom;Assistance with cooking/housework;Assist for transportation;Help with stairs or ramp for entrance   Can travel by private vehicle     Yes  Equipment Recommendations  Rollator (4 wheels)    Recommendations for Other Services       Precautions / Restrictions Precautions Precautions: Fall Precaution/Restrictions Comments: Monitor BP. Orthostatic - has abd binder, ace wraps, and thigh high compression stockings Restrictions Weight Bearing Restrictions Per Provider Order: No      Mobility  Bed Mobility Overal bed mobility: Modified Independent       Supine to sit: Modified independent (Device/Increase time), HOB elevated, Used rails Sit to supine: Modified independent (Device/Increase time), HOB elevated, Used rails        Transfers Overall transfer level: Needs assistance Equipment used: Rolling walker (2 wheels) Transfers: Sit to/from Stand Sit to Stand: Supervision                Ambulation/Gait                   Stairs             Wheelchair Mobility     Tilt Bed    Modified Rankin (Stroke Patients Only)       Balance Overall balance assessment: Needs assistance Sitting-balance support: No upper extremity supported, Feet supported Sitting balance-Leahy Scale: Good     Standing balance support: During functional activity, No upper extremity supported, Single extremity supported Standing balance-Leahy Scale: Fair                              Communication    Cognition Arousal: Alert Behavior During Therapy: Agitated   PT - Cognitive impairments: History of cognitive impairments                                Cueing    Exercises      General Comments  Pertinent Vitals/Pain Pain Assessment Pain Assessment: No/denies pain    Home Living                          Prior Function            PT Goals (current goals can now be found in the care plan section) Acute Rehab PT Goals Patient Stated Goal: to be able to leave his house for "fun" activity PT Goal Formulation: With patient Time For Goal Achievement: 06/18/23 Potential to Achieve Goals: Fair Progress towards PT goals: Progressing toward goals    Frequency    Min 1X/week      PT Plan      Co-evaluation              AM-PAC PT "6 Clicks" Mobility   Outcome Measure  Help needed turning from your back to your side while in a flat bed without using bedrails?: None Help needed moving  from lying on your back to sitting on the side of a flat bed without using bedrails?: None Help needed moving to and from a bed to a chair (including a wheelchair)?: A Little Help needed standing up from a chair using your arms (e.g., wheelchair or bedside chair)?: A Little Help needed to walk in hospital room?: A Little Help needed climbing 3-5 steps with a railing? : A Lot 6 Click Score: 19    End of Session   Activity Tolerance: Treatment limited secondary to agitation Patient left: in bed;with call bell/phone within reach;with bed alarm set Nurse Communication: Mobility status PT Visit Diagnosis: Unsteadiness on feet (R26.81);Muscle weakness (generalized) (M62.81);Difficulty in walking, not elsewhere classified (R26.2);Dizziness and giddiness (R42)     Time: 4696-2952 PT Time Calculation (min) (ACUTE ONLY): 19 min  Charges:    $Therapeutic Activity: 8-22 mins PT General Charges $$ ACUTE PT VISIT: 1 Visit                     Aleda Grana, PT, DPT 06/05/23, 3:34 PM   Sandi Mariscal 06/05/2023, 3:31 PM

## 2023-06-05 NOTE — Plan of Care (Signed)
  Problem: Nutritional: Goal: Progress toward achieving an optimal weight will improve Outcome: Progressing   Problem: Skin Integrity: Goal: Risk for impaired skin integrity will decrease Outcome: Progressing   Problem: Health Behavior/Discharge Planning: Goal: Ability to manage health-related needs will improve Outcome: Progressing   Problem: Clinical Measurements: Goal: Respiratory complications will improve Outcome: Progressing   Problem: Activity: Goal: Risk for activity intolerance will decrease Outcome: Progressing   Problem: Elimination: Goal: Will not experience complications related to urinary retention Outcome: Progressing   Problem: Education: Goal: Knowledge of disease and its progression will improve Outcome: Progressing   Problem: Fluid Volume: Goal: Compliance with measures to maintain balanced fluid volume will improve Outcome: Progressing   Problem: Health Behavior/Discharge Planning: Goal: Ability to manage health-related needs will improve Outcome: Progressing   Problem: Nutritional: Goal: Ability to make healthy dietary choices will improve Outcome: Progressing   Problem: Clinical Measurements: Goal: Complications related to the disease process, condition or treatment will be avoided or minimized Outcome: Progressing

## 2023-06-05 NOTE — Plan of Care (Signed)
  Problem: Nutritional: Goal: Progress toward achieving an optimal weight will improve Outcome: Completed/Met

## 2023-06-05 NOTE — Progress Notes (Signed)
 I was present at this dialysis session. I have reviewed the session itself and made appropriate changes.   Filed Weights   06/03/23 1119 06/05/23 0249 06/05/23 0931  Weight: 84 kg 84.1 kg 88.4 kg    Recent Labs  Lab 06/05/23 0936  NA 131*  K 3.1*  CL 96*  CO2 21*  GLUCOSE 171*  BUN 62*  CREATININE 5.39*  CALCIUM 8.2*  PHOS 3.3    Recent Labs  Lab 06/02/23 0810 06/03/23 0830  WBC 7.3 7.0  HGB 8.7* 8.9*  HCT 27.0* 27.7*  MCV 98.5 100.0  PLT 230 202    Scheduled Meds:  cholecalciferol  5,000 Units Oral Once per day on Monday Thursday   fludrocortisone  0.2 mg Oral Daily   gabapentin  300 mg Oral QHS   heparin injection (subcutaneous)  5,000 Units Subcutaneous Q8H   heparin sodium (porcine)       lidocaine  1 Application Urethral Once   midodrine  10 mg Oral TID WC   midodrine  10 mg Oral Q M,W,F-HD   multivitamin  1 tablet Oral QHS   pantoprazole  40 mg Oral Daily   sertraline  50 mg Oral Daily   tamsulosin  0.4 mg Oral Daily   Continuous Infusions: PRN Meds:.acetaminophen, camphor-menthol, heparin sodium (porcine), hydrOXYzine, lidocaine-prilocaine, loperamide, ondansetron, polyethylene glycol   Anthony Sar, MD [facility] 06/05/2023, 11:40 AM

## 2023-06-05 NOTE — Progress Notes (Signed)
 PT Cancellation Note  Patient Details Name: Louis Butler MRN: [id number] DOB: [birth year]   Cancelled Treatment:    Reason Eval/Treat Not Completed: Patient at procedure or test/unavailable Attempted to see pt for PT tx. Pt noted to be off the floor at dialysis at this time. Will f/u as able.  Aleda Grana, PT, DPT 06/05/23, 10:12 AM   Sandi Mariscal 06/05/2023, 10:12 AM

## 2023-06-05 NOTE — Progress Notes (Signed)
 Pt completed HD tx without issue. Midodrine 10 mg po given during HD.  06/05/23 1330  Vitals  Temp 98.9 F (37.2 C)  Temp Source Oral  BP 123/63  BP Location Right Arm  BP Method Automatic  Patient Position (if appropriate) Lying  Pulse Rate 74  Pulse Rate Source Monitor  Resp 16  During Treatment Monitoring  Blood Flow Rate (mL/min) 399 mL/min  Arterial Pressure (mmHg) -210.9 mmHg  Venous Pressure (mmHg) 222.61 mmHg  TMP (mmHg) 9.29 mmHg  Ultrafiltration Rate (mL/min) 717 mL/min  Dialysate Flow Rate (mL/min) 299 ml/min  Duration of HD Treatment -hour(s) 3.71 hour(s)  Cumulative Fluid Removed (mL) per Treatment  1973.79  HD Safety Checks Performed Yes  Intra-Hemodialysis Comments Tx completed  Post Treatment  Dialyzer Clearance Lightly streaked  Hemodialysis Intake (mL) 0 mL  Liters Processed 90  Fluid Removed (mL) 2000 mL  Tolerated HD Treatment Yes  Post-Hemodialysis Comments Pt goal met.  AVG/AVF Arterial Site Held (minutes) 15 minutes  AVG/AVF Venous Site Held (minutes) 10 minutes  Fistula / Graft Left Upper arm  No placement date or time found.   Placed prior to admission: Yes  Orientation: Left  Access Location: Upper arm  Site Condition No complications  Fistula / Graft Assessment Present;Thrill;Bruit  Status Deaccessed  Needle Size 15  Drainage Description None

## 2023-06-05 NOTE — Progress Notes (Signed)
 Nutrition Follow-up  DOCUMENTATION CODES:  Non-severe (moderate) malnutrition in context of chronic illness  INTERVENTION:  Encourage adequate PO intake; double protein portions with meals Renal MVI with minerals daily Liberalized diet from carb modified  Double portion protein Magic Cup BID, Might Shake w/ breakfast  NUTRITION DIAGNOSIS:  Moderate Malnutrition related to chronic illness (renal disease) as evidenced by moderate fat depletion, mild muscle depletion, moderate muscle depletion, percent weight loss (16% weight loss within 6 months). - remains applicable  GOAL:  Patient will meet greater than or equal to 90% of their needs - progressing   MONITOR:  PO intake, Supplement acceptance, Skin  REASON FOR ASSESSMENT:  Consult Assessment of nutrition requirement/status, Diet education  ASSESSMENT:   68 yo male admitted with acute on chronic renal failure. PMH includes ESRD-stopped HD 06/2020, DM-2, GERD, HTN.  Continues to wait for LTAC bed and completing iHD txs while admitted. He is medically stable.   Nutritionally stable with appetite averaging 94% intake over last week. Continues with no N/V/C/D or difficulties chewing or swallowing. Due to patient restrictive diet and large body habitus, will add double portion protein to meals and Magic Cup to lunch and dinner trays. Mighty Shake to breakfast tray for added calories and protein.     Dialysis tx today. No edema to BLEs. UOP yesterday .   Admit Weight: 76.3kg Current Weight: 88kg   Net IO Since Admission: -32,624.87 mL [06/05/23 1154]    Remains on vitamin D3 and renal vitamin. No binders currently ordered with PHOS within range. Na+ likely r/t volume overload. Correcting with iHD. Fluid restriction of 1534mL/day in place.   Meds: cholecalciferol, renal MVI, pantoprazole   Labs:  Na+ 132>131 (L) K+ 3.6>3.1 (L) CBGs 171-199 x8 hours A1c 6.8 (04/2023)  Diet Order:   Diet Order             Diet renal  with fluid restriction Fluid restriction: 1500 mL Fluid; Room service appropriate? Yes; Fluid consistency: Thin  Diet effective now             EDUCATION NEEDS:   Education needs have been addressed  Skin:  Skin Assessment: Reviewed RN Assessment Skin Integrity Issues:: Stage II Stage II: R buttocks  Last BM:  2/26  Height:  Ht Readings from Last 1 Encounters:  04/29/23 6\' 3"  (1.905 m)   Weight:  Wt Readings from Last 1 Encounters:  06/05/23 88.4 kg    Ideal Body Weight:  89.1 kg  BMI:  Body mass index is 24.36 kg/m.  Estimated Nutritional Needs:   Kcal:  2200-2400  Protein:  110-120 gm  Fluid:  1 L + UOP  Myrtie Cruise MS, RD, LDN Registered Dietitian Clinical Nutrition RD Inpatient Contact Info in Amion

## 2023-06-06 DIAGNOSIS — N1330 Unspecified hydronephrosis: Secondary | ICD-10-CM | POA: Diagnosis not present

## 2023-06-06 DIAGNOSIS — N186 End stage renal disease: Secondary | ICD-10-CM | POA: Diagnosis not present

## 2023-06-06 MED ORDER — IRON SUCROSE 200 MG IVPB - SIMPLE MED
200.0000 mg | Status: DC
  Administered 2023-06-08 – 2023-06-10 (×2): 200 mg via INTRAVENOUS
  Filled 2023-06-06: qty 110
  Filled 2023-06-06: qty 200
  Filled 2023-06-06: qty 110
  Filled 2023-06-06: qty 200

## 2023-06-06 NOTE — Progress Notes (Signed)
 Patient ID: Louis Butler, male   DOB: 1955-12-26, 68 y.o.   MRN: [id number] [facility] Progress Note   Assessment/ Plan:   1.  Deconditioning status post hospitalization: Continue with plans for rehab as able.  Orthostatic blood pressures on treatment with midodrine and florinef. LTAC[facility] pending  2. ESRD: Continue hemodialysis on MWF schedule. [facility] to resume dialysis after discharge from [facility] however may not be able to hold spot while he's at [facility]. Heparin w/ hd 3. Anemia: Low but relatively stable hemoglobin and hematocrit.  Holding off on ESA with recent head/neck cancer. Transfuse prn. Fe deplete. 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 Nutrition: Continue protein/nutritional supplementation.  Renal diet 6. Hypotension: ACTH stimulation test negative for adrenal insufficiency.  Midodrine dose decreased and empirically started on florinef with recurrent orthostatic drops. Droxidopa cost prohibitive.   Dispo: pending bed at [facility]  Patient is stable. Will be seen again on Monday. Please call in the interim with any questions/concerns.    Subjective:   Patient seen and examined bedside. No complaints, no acute events. Tolerated HD, net uf 2L yesterday. Waiting on bed at [facility] still.   Objective:   BP (!) 126/57 (BP Location: Right Arm)   Pulse 73   Temp 98 F (36.7 C) (Oral)   Resp 17   Ht 6\' 3"  (1.905 m)   Wt 86.2 kg   SpO2 96%   BMI 23.75 kg/m   Physical Exam: Gen: NAD CVS: Normal rate Resp: Bilateral chest rise no increased work of breathing Abd: Soft, flat, nontender, bowel sounds normal Ext: No lower extremity edema.  Left upper arm AV fistula Neuro: awake, alert  Labs: BMET Recent Labs  Lab 05/31/23 0625 06/01/23 0950 06/02/23 0503 06/03/23 0800 06/05/23 0936  NA 134* 132* 132* 132* 131*  K 4.2 3.7 3.6 3.6 3.1*  CL 92* 96* 100 94* 96*  CO2 27 23 24  25  21*  GLUCOSE 124* 160* 135* 199* 171*  BUN 43* 61* 68* 45* 62*  CREATININE 4.62* 5.83* 6.28* 4.73* 5.39*  CALCIUM 8.8* 8.5* 7.8* 8.5* 8.2*  PHOS 4.9* 4.2 5.4* 3.5 3.3   CBC Recent Labs  Lab 06/02/23 0810 06/03/23 0830  WBC 7.3 7.0  HGB 8.7* 8.9*  HCT 27.0* 27.7*  MCV 98.5 100.0  PLT 230 202      Medications:     cholecalciferol  5,000 Units Oral Once per day on Monday Thursday   fludrocortisone  0.2 mg Oral Daily   gabapentin  300 mg Oral QHS   heparin injection (subcutaneous)  5,000 Units Subcutaneous Q8H   lidocaine  1 Application Urethral Once   midodrine  10 mg Oral TID WC   midodrine  10 mg Oral Q M,W,F-HD   multivitamin  1 tablet Oral QHS   pantoprazole  40 mg Oral Daily   sertraline  50 mg Oral Daily   tamsulosin  0.4 mg Oral Daily    Jarae Nemmers  06/06/2023, 1:23 PM

## 2023-06-06 NOTE — Progress Notes (Signed)
     Daily Progress Note Intern Pager: [phone]  Patient name: Louis Butler Medical record number: [id number] Date of birth: Aug 08, 1955 Age: 68 y.o. Gender: male  Primary Care Provider: Latrelle Dodrill, MD Consultants: Nephrology, CCM Code Status: Full  Pt Overview and Major Events to Date:  1/22: admitted to ICU for CRRT 1/26: stable from ICU, FMTS took over care  2/17: insurance approved LTACH  Assessment and Plan: Louis Butler is a 68 y.o. male awaiting LTAC placement.  Hospital course consisted of admission to ICU for renal failure requiring CRRT. Pertinent PMH/PSH includes HTN, T2DM, GERD, ESRD now on HD.  Assessment & Plan Orthostatic hypotension No acute events overnight patient appears to have been orthostatic with Occupational Therapy yesterday, which is to be expected. - Midodrine 10 mg 3 times daily with additional 10 mg Monday Wednesday Friday during dialysis - Fludrocortisone 0.2 mg daily - Target MAP >60  - Abdominal binders, compression stockings - CTM vitals  - RFP on dialysis days   Chronic and Stable Issues: Bilateral Hydronephrosis/Obstruction: patient refused foley replacement with nursing yesterday. Discussed with patient today. He is again refusing foley replacement unless someone from urology comes to see him. Discussed with Sheria Lang, NP with urology who will come and speak with patient today.  Urology team to see. ESRD-HD MWF Depressed mood-continue Zoloft HTN-holding home medications in setting of orthostasis GERD-Protonix 40 mg daily T2DM-A1c 6.8, will need CBG monitoring and adjustment to insulin regimen outpatient  Cancer of skin/ears/external auditory canal-stable, continue scheduled Tylenol Anemia-stable, transfuse for hemoglobin less than 7, monitoring on HD days Neuropathy: continue gabapentin 300 mg daily at bedtime  FEN/GI: Heart healthy with fluid restriction 1500 mL PPx: Heparin subcu every 8 hours Dispo: LTAC awaiting placement ,  medically stable for discharge  Subjective:  No complaints or concerns this morning.  Objective: Temp:  [97.8 F (36.6 C)-98.9 F (37.2 C)] 98.5 F (36.9 C) (03/01 0502) Pulse Rate:  [71-83] 75 (03/01 0502) Resp:  [14-19] 18 (03/01 0502) BP: (110-146)/(45-65) 119/60 (03/01 0502) SpO2:  [95 %-99 %] 95 % (03/01 0502) Weight:  [86.2 kg-88.4 kg] 86.2 kg (03/01 0108) Physical Exam: General: NAD Cardiovascular: RRR, no appreciable murmurs Respiratory: CTAB, normal WOB Abdomen: Soft, nontender, normoactive bowel sounds Extremities: No pitting edema BLEs  Laboratory: Most recent CBC Lab Results  Component Value Date   WBC 7.0 06/03/2023   HGB 8.9 (L) 06/03/2023   HCT 27.7 (L) 06/03/2023   MCV 100.0 06/03/2023   PLT 202 06/03/2023   Most recent BMP    Latest Ref Rng & Units 06/05/2023    9:36 AM  BMP  Glucose 70 - 99 mg/dL 147   BUN 8 - 23 mg/dL 62   Creatinine 8.29 - 1.24 mg/dL 5.62   Sodium 130 - 865 mmol/L 131   Potassium 3.5 - 5.1 mmol/L 3.1   Chloride 98 - 111 mmol/L 96   CO2 22 - 32 mmol/L 21   Calcium 8.9 - 10.3 mg/dL 8.2    Imaging/Diagnostic Tests: No recent imaging results  Shelby Mattocks, DO 06/06/2023, 7:00 AM  PGY-3, [facility] FPTS Intern pager: [phone], text pages welcome Secure chat group [facility] Teaching Service

## 2023-06-06 NOTE — Plan of Care (Signed)
  Problem: Skin Integrity: Goal: Risk for impaired skin integrity will decrease Outcome: Progressing   Problem: Health Behavior/Discharge Planning: Goal: Ability to manage health-related needs will improve Outcome: Progressing   Problem: Clinical Measurements: Goal: Respiratory complications will improve Outcome: Progressing   Problem: Activity: Goal: Risk for activity intolerance will decrease Outcome: Progressing   Problem: Elimination: Goal: Will not experience complications related to urinary retention Outcome: Progressing

## 2023-06-06 NOTE — Assessment & Plan Note (Signed)
 No acute events overnight patient appears to have been orthostatic with Occupational Therapy yesterday, which is to be expected. - Midodrine 10 mg 3 times daily with additional 10 mg Monday Wednesday Friday during dialysis -Fludrocortisone 0.2 mg daily - Target MAP >60  - Abdominal binders, compression stockings - CTM vitals  - RFP on dialysis days

## 2023-06-07 DIAGNOSIS — N186 End stage renal disease: Secondary | ICD-10-CM | POA: Diagnosis not present

## 2023-06-07 NOTE — Assessment & Plan Note (Signed)
 No acute events overnight patient appears to have been orthostatic with Occupational Therapy yesterday, which is to be expected. - Midodrine 10 mg 3 times daily with additional 10 mg Monday Wednesday Friday during dialysis - Fludrocortisone 0.2 mg daily - Target MAP >60  - Abdominal binders, compression stockings - CTM vitals  - RFP on dialysis days (MWF)

## 2023-06-07 NOTE — Plan of Care (Signed)
   Problem: Skin Integrity: Goal: Risk for impaired skin integrity will decrease Outcome: Completed/Met

## 2023-06-07 NOTE — Plan of Care (Signed)
  Problem: Health Behavior/Discharge Planning: Goal: Ability to manage health-related needs will improve Outcome: Progressing   Problem: Clinical Measurements: Goal: Respiratory complications will improve Outcome: Progressing   Problem: Activity: Goal: Risk for activity intolerance will decrease Outcome: Progressing   Problem: Elimination: Goal: Will not experience complications related to urinary retention Outcome: Progressing   Problem: Education: Goal: Knowledge of disease and its progression will improve Outcome: Progressing

## 2023-06-07 NOTE — Progress Notes (Signed)
     Daily Progress Note Intern Pager: [phone]  Patient name: Louis Butler Medical record number: [id number] Date of birth: [birth year] Age: 68 y.o. Gender: male  Primary Care Provider: Latrelle Dodrill, MD Consultants: Nephrology, CCM Code Status: Full  Pt Overview and Major Events to Date:  1/22: admitted to ICU for CRRT 1/26: stable from ICU, FMTS took over care  2/17: insurance approved LTACH  Assessment and Plan: Louis Butler is a 68 y.o. male awaiting LTAC placement.  Hospital course consisted of admission to ICU for renal failure requiring CRRT. Pertinent PMH/PSH includes HTN, T2DM, GERD, ESRD now on HD.   Medically stable for discharge, awaiting placement Assessment & Plan Orthostatic hypotension No acute events overnight patient appears to have been orthostatic with Occupational Therapy yesterday, which is to be expected. - Midodrine 10 mg 3 times daily with additional 10 mg Monday Wednesday Friday during dialysis - Fludrocortisone 0.2 mg daily - Target MAP >60  - Abdominal binders, compression stockings - CTM vitals  - RFP on dialysis days (MWF)   Chronic and Stable Issues: Bilateral Hydronephrosis/Obstruction: Foley in place but needs to be changed.  Patient insists on urology changing catheter, per RN they plan to come Monday. ESRD-HD MWF Depressed mood-continue Zoloft HTN-holding home medications in setting of orthostasis GERD-Protonix 40 mg daily T2DM-A1c 6.8, will need CBG monitoring and adjustment to insulin regimen outpatient  Cancer of skin/ears/external auditory canal-stable, continue scheduled Tylenol Anemia-stable, transfuse for hemoglobin less than 7, monitoring on HD days Neuropathy: continue gabapentin 300 mg daily at bedtime    FEN/GI: Renal with fluid restriction PPx: Heparin Dispo: LTAC awaiting placement  Subjective:  Sleepy this morning, no complaints.  Objective: Temp:  [97.5 F (36.4 C)-98.5 F (36.9 C)] 97.5 F (36.4 C)  (03/02 0427) Pulse Rate:  [68-73] 68 (03/02 0427) Resp:  [17-20] 18 (03/02 0427) BP: (124-150)/(57-69) 124/57 (03/02 0427) SpO2:  [96 %-99 %] 97 % (03/02 0427) Physical Exam: General: Well-appearing 68 year old male, lying in bed, tired appearing Cardiovascular: RRR, normal S1/S2, no murmur Respiratory: CTAB, normal effort Abdomen: Soft, nontender palpation, nondistended, bowel sounds present Extremities: No edema BLEs  Laboratory: Most recent CBC Lab Results  Component Value Date   WBC 7.0 06/03/2023   HGB 8.9 (L) 06/03/2023   HCT 27.7 (L) 06/03/2023   MCV 100.0 06/03/2023   PLT 202 06/03/2023   Most recent BMP    Latest Ref Rng & Units 06/05/2023    9:36 AM  BMP  Glucose 70 - 99 mg/dL 841   BUN 8 - 23 mg/dL 62   Creatinine 3.24 - 1.24 mg/dL 4.01   Sodium 027 - 253 mmol/L 131   Potassium 3.5 - 5.1 mmol/L 3.1   Chloride 98 - 111 mmol/L 96   CO2 22 - 32 mmol/L 21   Calcium 8.9 - 10.3 mg/dL 8.2      Erick Alley, DO 06/07/2023, 5:16 AM  PGY-3, [facility] FPTS Intern pager: [phone], text pages welcome Secure chat group [facility] Teaching Service

## 2023-06-08 DIAGNOSIS — N186 End stage renal disease: Secondary | ICD-10-CM | POA: Diagnosis not present

## 2023-06-08 DIAGNOSIS — N189 Chronic kidney disease, unspecified: Secondary | ICD-10-CM | POA: Diagnosis not present

## 2023-06-08 DIAGNOSIS — N179 Acute kidney failure, unspecified: Secondary | ICD-10-CM | POA: Diagnosis not present

## 2023-06-08 DIAGNOSIS — E876 Hypokalemia: Secondary | ICD-10-CM | POA: Insufficient documentation

## 2023-06-08 LAB — RENAL FUNCTION PANEL
Albumin: 2.9 g/dL — ABNORMAL LOW (ref 3.5–5.0)
Anion gap: 14 (ref 5–15)
BUN: 52 mg/dL — ABNORMAL HIGH (ref 8–23)
CO2: 23 mmol/L (ref 22–32)
Calcium: 8.4 mg/dL — ABNORMAL LOW (ref 8.9–10.3)
Chloride: 96 mmol/L — ABNORMAL LOW (ref 98–111)
Creatinine, Ser: 4.86 mg/dL — ABNORMAL HIGH (ref 0.61–1.24)
GFR, Estimated: 12 mL/min — ABNORMAL LOW (ref >60)
Glucose, Bld: 164 mg/dL — ABNORMAL HIGH (ref 70–99)
Phosphorus: 3.6 mg/dL (ref 2.5–4.6)
Potassium: 3.3 mmol/L — ABNORMAL LOW (ref 3.5–5.1)
Sodium: 133 mmol/L — ABNORMAL LOW (ref 135–145)

## 2023-06-08 MED ORDER — POTASSIUM CHLORIDE 20 MEQ PO PACK
40.0000 meq | PACK | Freq: Once | ORAL | Status: AC
  Administered 2023-06-08: 40 meq via ORAL
  Filled 2023-06-08 (×2): qty 2

## 2023-06-08 MED ORDER — HEPARIN SODIUM (PORCINE) 1000 UNIT/ML IJ SOLN
INTRAMUSCULAR | Status: AC
  Filled 2023-06-08: qty 4

## 2023-06-08 NOTE — Assessment & Plan Note (Signed)
 History of hydronephrosis with obstruction.  Foley in place. Urology to evaluate patient and replace catheter per patient request.

## 2023-06-08 NOTE — Progress Notes (Signed)
 PT Cancellation Note  Patient Details Name: KREIG PARSON MRN: [id number] DOB: 07/04/1955   Cancelled Treatment:    Reason Eval/Treat Not Completed: Other (comment) (Pt back from HD however nurse let PT and mobility know that he needs to eat his lunch and it is just arriving. Will defer PT to tomorrow.)   [name] 06/08/2023, 3:28 PM Krystyn Picking M,PT Acute Rehab Services [phone]

## 2023-06-08 NOTE — Assessment & Plan Note (Addendum)
 K 3.3. -KCl 40 mEq PO x1 -Fludrocortisone as above

## 2023-06-08 NOTE — TOC Progression Note (Signed)
 Transition of Care [facility]) - Progression Note    Patient Details  Name: Louis Butler MRN: [id number] Date of Birth: [birth year]  Transition of Care [facility]) CM/SW Contact  Tom-Johnson, Hershal Coria, RN Phone Number: 06/08/2023, 3:08 PM  Clinical Narrative:     [facility]  CM will continue to follow.         Expected Discharge Plan: Skilled Nursing Facility Barriers to Discharge: Continued Medical Work up  Expected Discharge Plan and Services   Discharge Planning Services: CM Consult Post Acute Care Choice: Home Health Living arrangements for the past 2 months: Single Family Home Expected Discharge Date: 05/11/23               DME Arranged: Hospital bed DME Agency: [employer] Date DME Agency Contacted: 05/04/23 Time DME Agency Contacted: [phone] Representative spoke with at DME Agency: Earna Coder HH Arranged: PT, Nurse's Aide HH Agency: [facility] Date [facility] Agency Contacted: 05/04/23 Time HH Agency Contacted: [phone] Representative spoke with at [facility] Agency: Amy   Social Determinants of Health (SDOH) Interventions SDOH Screenings   Food Insecurity: No Food Insecurity (05/08/2023)  Housing: Low Risk  (05/08/2023)  Transportation Needs: No Transportation Needs (05/08/2023)  Utilities: Not At Risk (05/08/2023)  Alcohol Screen: Low Risk  (12/01/2022)  Depression (PHQ2-9): Medium Risk (12/24/2022)  Financial Resource Strain: Low Risk  (12/01/2022)  Physical Activity: Insufficiently Active (12/01/2022)  Social Connections: Moderately Integrated (05/08/2023)  Stress: Stress Concern Present (12/01/2022)  Tobacco Use: Medium Risk (04/29/2023)  Health Literacy: Adequate Health Literacy (12/01/2022)    Readmission Risk Interventions    05/04/2023    4:35 PM  Readmission Risk Prevention Plan  Transportation Screening Complete  HRI or Home Care Consult Complete  Social Work Consult for Recovery Care Planning/Counseling Complete  Palliative Care Screening Not  Applicable  Medication Review Oceanographer) Referral to Pharmacy

## 2023-06-08 NOTE — Progress Notes (Signed)
 PT Cancellation Note  Patient Details Name: Louis Butler MRN: [id number] DOB: 1955/04/19   Cancelled Treatment:    Reason Eval/Treat Not Completed: Patient at procedure or test/unavailable (Pt in HD.  Will check back as able.)   [name] 06/08/2023, 9:53 AM Usama Harkless M,PT Acute Rehab Services [phone]

## 2023-06-08 NOTE — Procedures (Signed)
 I was present at this dialysis session. I have reviewed the session itself and made appropriate changes.   AM Labs are pending.  Added K bath.  UF goal of 2L.  Using LUE AVF as access.    Await LTACH  Filed Weights   06/05/23 0931 06/05/23 1356 06/06/23 0108  Weight: 88.4 kg 86.2 kg 86.2 kg    Recent Labs  Lab 06/05/23 0936  NA 131*  K 3.1*  CL 96*  CO2 21*  GLUCOSE 171*  BUN 62*  CREATININE 5.39*  CALCIUM 8.2*  PHOS 3.3    Recent Labs  Lab 06/02/23 0810 06/03/23 0830  WBC 7.3 7.0  HGB 8.7* 8.9*  HCT 27.0* 27.7*  MCV 98.5 100.0  PLT 230 202    Scheduled Meds:  heparin sodium (porcine)       cholecalciferol  5,000 Units Oral Once per day on Monday Thursday   fludrocortisone  0.2 mg Oral Daily   gabapentin  300 mg Oral QHS   heparin injection (subcutaneous)  5,000 Units Subcutaneous Q8H   lidocaine  1 Application Urethral Once   midodrine  10 mg Oral TID WC   midodrine  10 mg Oral Q M,W,F-HD   multivitamin  1 tablet Oral QHS   pantoprazole  40 mg Oral Daily   sertraline  50 mg Oral Daily   tamsulosin  0.4 mg Oral Daily   Continuous Infusions:  iron sucrose     PRN Meds:.heparin sodium (porcine), acetaminophen, camphor-menthol, hydrOXYzine, lidocaine-prilocaine, loperamide, ondansetron, polyethylene glycol   Sabra Heck  MD 06/08/2023, 9:32 AM

## 2023-06-08 NOTE — Assessment & Plan Note (Signed)
 No acute events overnight patient appears to have been orthostatic with Occupational Therapy yesterday, which is to be expected. - Midodrine 10 mg 3 times daily with additional 10 mg Monday Wednesday Friday during dialysis - Fludrocortisone 0.2 mg daily - Target MAP >60  - Abdominal binders, compression stockings - CTM vitals  - RFP on dialysis days (MWF)

## 2023-06-08 NOTE — Progress Notes (Signed)
 Daily Progress Note Intern Pager: [phone]  Patient name: Louis Butler Medical record number: [id number] Date of birth: September 11, 1955 Age: 68 y.o. Gender: male  Primary Care Provider: Latrelle Dodrill, MD Consultants: Nephrology, CCM Code Status: FULL  Pt Overview and Major Events to Date:  1/22: Admitted to ICU for CRRT 1/26: Stable from ICU, FMTS took over care  2/17: Insurance approved LTAC 3/3: Urology evaluation for Foley replacement  Assessment and Plan: Louis Butler is a 68 y.o. male with PMH of HTN, T2DM, GERD, ESRD now on HD awaiting LTAC placement, was in ICU earlier requiring CRRT this admission.  Urology reportedly to replace Foley today, otherwise medically stable and awaiting LTAC. Assessment & Plan Orthostatic hypotension No acute events overnight patient appears to have been orthostatic with Occupational Therapy yesterday, which is to be expected. - Midodrine 10 mg 3 times daily with additional 10 mg Monday Wednesday Friday during dialysis - Fludrocortisone 0.2 mg daily - Target MAP >60  - Abdominal binders, compression stockings - CTM vitals  - RFP on dialysis days (MWF) Hypokalemia K 3.3. -KCl 40 mEq PO x1 -Fludrocortisone as above Hydronephrosis History of hydronephrosis with obstruction.  Foley in place. Urology to evaluate patient and replace catheter per patient request.  Chronic and Stable Problems: ESRD: HD MWF Depressed mood: Continue Zoloft HTN: Holding home medications in setting of orthostasis GERD: Protonix 40 mg daily T2DM: A1c 6.8, will need CBG monitoring and adjustment to insulin regimen outpatient  Cancer of skin/ears/external auditory canal: Stable, continue scheduled Tylenol Anemia: Stable, transfuse for hemoglobin less than 7, monitoring on HD days Neuropathy: Continue gabapentin 300 mg daily at bedtime  FEN/GI: Renal with fluid restriction PPx: Heparin Dispo: LTAC awaiting placement, medically stable for discharge.  Per  Case Manager, working with [facility] to identify DCs for appropriate transfer.  Subjective:  Patient doing well this morning.  No complaints, no dyspnea.  Discussed standing from bed slowly as patient endorsed mild dizziness with PT earlier.  Objective: Temp:  [97.7 F (36.5 C)-98.3 F (36.8 C)] 98.3 F (36.8 C) (03/03 0624) Pulse Rate:  [66-73] 73 (03/03 0624) Resp:  [16-18] 16 (03/03 0624) BP: (121-147)/(52-63) 139/63 (03/03 0624) SpO2:  [96 %-100 %] 98 % (03/03 0624)  Physical Exam: General: Age-appropriate, resting comfortably in bed, NAD, alert and at baseline. Cardiovascular: Regular rate and rhythm. Normal S1/S2. No murmurs, rubs, or gallops appreciated. 2+ radial pulses. Pulmonary: Clear bilaterally to ascultation. No increased WOB, no accessory muscle usage on room air. No wheezes, crackles, or rhonchi. Abdominal: Normoactive bowel sounds, nondistended. No tenderness to deep or light palpation. No rebound or guarding. GU: Foley in place. Extremities: Trace peripheral edema bilaterally.  Capillary refill <2 seconds.  Laboratory: Most recent CBC Lab Results  Component Value Date   WBC 7.0 06/03/2023   HGB 8.9 (L) 06/03/2023   HCT 27.7 (L) 06/03/2023   MCV 100.0 06/03/2023   PLT 202 06/03/2023   Most recent BMP    Latest Ref Rng & Units 06/05/2023    9:36 AM  BMP  Glucose 70 - 99 mg/dL 324   BUN 8 - 23 mg/dL 62   Creatinine 4.01 - 1.24 mg/dL 0.27   Sodium 253 - 664 mmol/L 131   Potassium 3.5 - 5.1 mmol/L 3.1   Chloride 98 - 111 mmol/L 96   CO2 22 - 32 mmol/L 21   Calcium 8.9 - 10.3 mg/dL 8.2     Other pertinent labs: -None  New Imaging/Diagnostic  Tests: -None  Ariyanna Oien, MD 06/08/2023, 8:16 AM  PGY-1, [facility] Family Medicine FPTS Intern pager: [phone], text pages welcome Secure chat group [facility] Teaching Service

## 2023-06-08 NOTE — Progress Notes (Signed)
 PT Cancellation Note  Patient Details Name: Louis Butler MRN: [id number] DOB: 08-14-1955   Cancelled Treatment:    Reason Eval/Treat Not Completed: Patient at procedure or test/unavailable (Pt still in HD. Will check back as able.)   [name] 06/08/2023, 2:19 PM Gorgeous Newlun M,PT Acute Rehab Services [phone]

## 2023-06-08 NOTE — Progress Notes (Signed)
 Received patient in bed to unit.  Alert and oriented.  Informed consent signed and in chart.   TX duration: 3.75 hrs  Patient tolerated well.  Transported back to the room  Alert, without acute distress.  Hand-off given to patient's nurse.   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  Weight: 83.1   06/08/23 1434  Vitals  Temp 98.1 F (36.7 C)  Temp Source Oral  BP 130/64  Pulse Rate 74  Resp 16  Oxygen Therapy  SpO2 99 %  O2 Device Room Air  Patient Activity (if Appropriate) In bed  Pulse Oximetry Type Continuous  Oximetry Probe Site Changed No  Post Treatment  Dialyzer Clearance Clear  Hemodialysis Intake (mL) 0 mL  Liters Processed 90  Fluid Removed (mL) 2000 mL  Tolerated HD Treatment Yes  AVG/AVF Arterial Site Held (minutes) 5 minutes  AVG/AVF Venous Site Held (minutes) 5 minutes

## 2023-06-09 DIAGNOSIS — N186 End stage renal disease: Secondary | ICD-10-CM | POA: Diagnosis not present

## 2023-06-09 NOTE — Plan of Care (Signed)
 Spoke with on-call urology regarding Foley catheter.  Patient with obstructive uropathy and bilateral hydronephrosis as well as pseudomonal UTI, that was treated with cefepime for 14 days.  Foley was replaced on 06/04/2023.  Urology feels it is not necessary to replace Foley catheter at this time.  They recommend outpatient follow-up.  They remain available if questions regarding Foley care arise.  We greatly appreciate urology's input.  They will schedule outpatient appointment.

## 2023-06-09 NOTE — Plan of Care (Signed)
   Problem: Health Behavior/Discharge Planning: Goal: Ability to manage health-related needs will improve Outcome: Completed/Met

## 2023-06-09 NOTE — Assessment & Plan Note (Signed)
-  Fludrocortisone as above

## 2023-06-09 NOTE — Assessment & Plan Note (Addendum)
 No acute events overnight. - Midodrine 10 mg 3 times daily with additional 10 mg Monday Wednesday Friday during dialysis - Fludrocortisone 0.2 mg daily - Target MAP >60  - Abdominal binders, compression stockings - RFP on dialysis days (MWF)

## 2023-06-09 NOTE — Progress Notes (Signed)
 Patient ID: Louis Butler, male   DOB: 03-27-1956, 68 y.o.   MRN: [id number] [facility] Progress Note   Assessment/ Plan:   1.  Deconditioning status post hospitalization: Continue with plans for rehab as able.  Orthostatic blood pressures on treatment with midodrine and florinef. LTAC[facility] pending  2. ESRD: Continue hemodialysis on MWF schedule. [facility] to resume dialysis after discharge from [facility] however may not be able to hold spot while he's at [facility]. 3. Anemia: Low but relatively stable hemoglobin and hematocrit.  Holding off on ESA with recent head/neck cancer. Transfuse prn. Fe deplete. 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 Nutrition: Continue protein/nutritional supplementation.  Renal diet 6. Hypotension: ACTH stimulation test negative for adrenal insufficiency.  Adjust midodrine as needed.  Fludrocortisone ineffective given he is ESRD.  He is already sodium avid 7.  Mild hypokalemia: Trend and use added K bath  Dispo: pending bed at [facility]    Subjective:    HD yesterday uneventful, 2 L UF No complaints this morning, wife at bedside   Objective:   BP (!) 109/53 (BP Location: Right Arm)   Pulse 78   Temp 97.6 F (36.4 C) (Oral)   Resp 18   Ht 6\' 3"  (1.905 m)   Wt 83.1 kg   SpO2 97%   BMI 22.90 kg/m   Physical Exam: Gen: NAD CVS: Normal rate Resp: Bilateral chest rise no increased work of breathing Abd: Soft, flat, nontender, bowel sounds normal Ext: No lower extremity edema.  Left upper arm AV fistula Neuro: awake, alert  Labs: BMET Recent Labs  Lab 06/03/23 0800 06/05/23 0936 06/08/23 1007  NA 132* 131* 133*  K 3.6 3.1* 3.3*  CL 94* 96* 96*  CO2 25 21* 23  GLUCOSE 199* 171* 164*  BUN 45* 62* 52*  CREATININE 4.73* 5.39* 4.86*  CALCIUM 8.5* 8.2* 8.4*  PHOS 3.5 3.3 3.6   CBC Recent Labs  Lab 06/03/23 0830  WBC 7.0  HGB 8.9*  HCT 27.7*  MCV  100.0  PLT 202      Medications:     cholecalciferol  5,000 Units Oral Once per day on Monday Thursday   fludrocortisone  0.2 mg Oral Daily   gabapentin  300 mg Oral QHS   heparin injection (subcutaneous)  5,000 Units Subcutaneous Q8H   lidocaine  1 Application Urethral Once   midodrine  10 mg Oral TID WC   midodrine  10 mg Oral Q M,W,F-HD   multivitamin  1 tablet Oral QHS   pantoprazole  40 mg Oral Daily   sertraline  50 mg Oral Daily   tamsulosin  0.4 mg Oral Daily    Arita Miss  06/09/2023, 11:13 AM

## 2023-06-09 NOTE — Progress Notes (Signed)
 Daily Progress Note Intern Pager: [phone]  Patient name: Louis Butler Medical record number: [id number] Date of birth: Feb 27, 1956 Age: 68 y.o. Gender: male  Primary Care Provider: Latrelle Dodrill, MD Consultants: Nephrology, CCM  Code Status: FULL  Pt Overview and Major Events to Date: 1/22: Admitted to ICU for CRRT 1/26: Stable from ICU, FMTS took over care  2/17: Insurance approved LTAC, awaiting bed availability 3/3: Urology not recommending Foley replacement   Assessment and Plan: Louis Butler is a 68 y.o. male with PMH of HTN, T2DM, GERD, ESRD now on HD awaiting LTAC placement, was in ICU earlier requiring CRRT this admission.  Medically stable, awaiting placement at [facility].  LCSW working on process. Contacted [facility] to verify if Foley needs to be exchanged.  Received confirmation that there is no indication for Foley replacement and patient can continue with current Foley. Assessment & Plan Orthostatic hypotension No acute events overnight. - Midodrine 10 mg 3 times daily with additional 10 mg Monday Wednesday Friday during dialysis - Fludrocortisone 0.2 mg daily - Target MAP >60  - Abdominal binders, compression stockings - RFP on dialysis days (MWF) Hydronephrosis History of hydronephrosis with obstruction.  Foley in place.  No intervention indicated Hypokalemia -Fludrocortisone as above  Chronic and Stable Problems: ESRD: HD MWF Depressed mood: Continue Zoloft HTN: Holding home medications in setting of orthostasis GERD: Protonix 40 mg daily T2DM: A1c 6.8, will need CBG monitoring and adjustment to insulin regimen outpatient  Cancer of skin/ears/external auditory canal: Stable, continue scheduled Tylenol Anemia: Stable, transfuse for hemoglobin less than 7, monitoring on HD days Neuropathy: Continue gabapentin 300 mg daily at bedtime  FEN/GI: Renal with fluid restriction PPx: Heparin Dispo: LTAC awaiting placement, medically  stable for discharge.  Per Case Manager, working with [facility] to identify DCs for appropriate transfer.  Subjective:  Patient is feeling well.  Mild dizziness with standing.  Denies chest pain, dyspnea.  Wife at bedside who shared that Foley insertion was traumatic with subsequent hematuria and significant pain, which is the cause of the patient's reticence surrounding Foley replacement.   Objective: Temp:  [97.6 F (36.4 C)-98.7 F (37.1 C)] 97.6 F (36.4 C) (03/04 0818) Pulse Rate:  [61-81] 78 (03/04 0818) Resp:  [12-18] 18 (03/04 0818) BP: (107-137)/(53-66) 109/53 (03/04 0818) SpO2:  [96 %-100 %] 97 % (03/04 0818) Weight:  [83.1 kg-85.1 kg] 83.1 kg (03/03 1435)  Physical Exam: General: Elderly male, deconditioned, resting comfortably in bed, NAD, alert and at baseline. Cardiovascular: Regular rate and rhythm. Normal S1/S2. No murmurs, rubs, or gallops appreciated. 2+ radial pulses. Pulmonary: Clear bilaterally to ascultation. No increased WOB, no accessory muscle usage on room air. No wheezes, crackles, or rhonchi. Abdominal: Normoactive bowel sounds, nondistended. No tenderness to deep or light palpation. No rebound or guarding. Skin: Sacral ulcer with protective bandage, dry, well applied. Extremities: No peripheral edema bilaterally.  Capillary refill <2 seconds.   Laboratory: Most recent CBC Lab Results  Component Value Date   WBC 7.0 06/03/2023   HGB 8.9 (L) 06/03/2023   HCT 27.7 (L) 06/03/2023   MCV 100.0 06/03/2023   PLT 202 06/03/2023   Most recent BMP    Latest Ref Rng & Units 06/08/2023   10:07 AM  BMP  Glucose 70 - 99 mg/dL 295   BUN 8 - 23 mg/dL 52   Creatinine 6.21 - 1.24 mg/dL 3.08   Sodium 657 - 846 mmol/L 133   Potassium 3.5 - 5.1 mmol/L  3.3   Chloride 98 - 111 mmol/L 96   CO2 22 - 32 mmol/L 23   Calcium 8.9 - 10.3 mg/dL 8.4     Other pertinent labs: -None  New Imaging/Diagnostic Tests: -None  Itxel Wickard, MD 06/09/2023, 8:24 AM  PGY-1,  [facility] Family Medicine FPTS Intern pager: [phone], text pages welcome Secure chat group [facility] Teaching Service

## 2023-06-09 NOTE — Progress Notes (Signed)
 Physical Therapy Treatment Patient Details Name: Louis Butler MRN: [id number] DOB: 08-12-1955 Today's Date: 06/09/2023   History of Present Illness 68 y.o. male presented 1/22 with N/V/abd pain and was found to have marked hyperkalemia and renal failure. CRRT 1/23-1/24. 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 Ricard Dillon), DM, HTN, recent squamous cell ca H&N (surgery, XRT 2024), anemia.    PT Comments  Pt received supine in bed, wife present. Pt performed LE and UE ROM exercises in supine and sitting before standing up to assist BP. Compression stockings, abdominal binder, and ACE wraps also used. Pt was still orthostatic with initial standing though was asymptomatic. Pt ambulated with rollator beginning with 10' increments and increasing as tolerated up to 100' at a time. BP finally returned to pre ambulation level by end of ambulation. See results below in comments. PT will continue to follow.     If plan is discharge home, recommend the following: A little help with walking and/or transfers;A little help with bathing/dressing/bathroom;Assistance with cooking/housework;Assist for transportation;Help with stairs or ramp for entrance   Can travel by private vehicle     Yes  Equipment Recommendations  Rollator (4 wheels)    Recommendations for Other Services       Precautions / Restrictions Precautions Precautions: Fall Recall of Precautions/Restrictions: Intact Precaution/Restrictions Comments: Monitor BP. Orthostatic - has abd binder, ace wraps, and thigh high compression stockings Restrictions Weight Bearing Restrictions Per Provider Order: No     Mobility  Bed Mobility Overal bed mobility: Modified Independent Bed Mobility: Supine to Sit, Sit to Supine     Supine to sit: Modified independent (Device/Increase time), HOB elevated, Used rails Sit to supine: Modified independent (Device/Increase time), HOB elevated, Used rails        Transfers Overall transfer level: Needs  assistance Equipment used: Rollator (4 wheels) Transfers: Sit to/from Stand Sit to Stand: Supervision           General transfer comment: performed numerous times for strengthening as well as BP accomodation    Ambulation/Gait Ambulation/Gait assistance: Contact guard assist Gait Distance (Feet): 250 Feet (multiple seated rest breaks) Assistive device: Rollator (4 wheels) Gait Pattern/deviations: Step-through pattern, Decreased stride length, Trunk flexed Gait velocity: decreased Gait velocity interpretation: 1.31 - 2.62 ft/sec, indicative of limited community ambulator Pre-gait activities: marching General Gait Details: pt was orthostatic initially but asymptomatic. Began with bouts of 10' walking and then seated rest break and then kept increasing distance. Furthest distance was 100' without sitting. BP returned to baseline by end of ambulation.   Stairs             Wheelchair Mobility     Tilt Bed    Modified Rankin (Stroke Patients Only)       Balance Overall balance assessment: Needs assistance Sitting-balance support: No upper extremity supported, Feet supported Sitting balance-Leahy Scale: Good Sitting balance - Comments: no LOB with static sitting, no use of UE support   Standing balance support: During functional activity, No upper extremity supported, Single extremity supported Standing balance-Leahy Scale: Fair Standing balance comment: can static stand without UE support or walk with single UE support, mostly BUE support for endurance                            Communication Communication Communication: Impaired Factors Affecting Communication: Hearing impaired (hears better out of R ear)  Cognition Arousal: Alert Behavior During Therapy: WFL for tasks assessed/performed   PT - Cognitive impairments:  History of cognitive impairments                       PT - Cognition Comments: Seems to be at baseline, slow to respond at  times but partly due to [facility] Following commands: Intact      Cueing Cueing Techniques: Verbal cues, Gestural cues  Exercises General Exercises - Lower Extremity Ankle Circles/Pumps: AROM, Both, 10 reps, Supine Long Arc Quad: Strengthening, Both, Seated, 5 reps Heel Slides: AROM, Both, 10 reps, Supine Straight Leg Raises: Strengthening, Both, 10 reps, Supine Hip Flexion/Marching: Strengthening, Both, 10 reps, Seated    General Comments General comments (skin integrity, edema, etc.): compression stocking, abdominal binder, and ACE wraps used. BP began at 140/67, dropped to 117/69 in standing, then 107/69, but returned to 141/68 by end of ambulation      Pertinent Vitals/Pain Pain Assessment Pain Assessment: Faces Faces Pain Scale: Hurts little more Pain Location: chronic back pain Pain Descriptors / Indicators: Discomfort, Grimacing Pain Intervention(s): Monitored during session    Home Living                          Prior Function            PT Goals (current goals can now be found in the care plan section) Acute Rehab PT Goals Patient Stated Goal: to be able to leave his house for "fun" activity PT Goal Formulation: With patient Time For Goal Achievement: 06/18/23 Potential to Achieve Goals: Fair Progress towards PT goals: Progressing toward goals    Frequency    Min 1X/week      PT Plan      Co-evaluation              AM-PAC PT "6 Clicks" Mobility   Outcome Measure  Help needed turning from your back to your side while in a flat bed without using bedrails?: None Help needed moving from lying on your back to sitting on the side of a flat bed without using bedrails?: None Help needed moving to and from a bed to a chair (including a wheelchair)?: A Little Help needed standing up from a chair using your arms (e.g., wheelchair or bedside chair)?: A Little Help needed to walk in hospital room?: A Little Help needed climbing 3-5 steps with a  railing? : A Lot 6 Click Score: 19    End of Session Equipment Utilized During Treatment: Gait belt Activity Tolerance: Patient tolerated treatment well Patient left: in bed;with call bell/phone within reach;with family/visitor present Nurse Communication: Mobility status PT Visit Diagnosis: Unsteadiness on feet (R26.81);Muscle weakness (generalized) (M62.81);Difficulty in walking, not elsewhere classified (R26.2);Dizziness and giddiness (R42)     Time: 1478-2956 PT Time Calculation (min) (ACUTE ONLY): 47 min  Charges:    $Gait Training: 23-37 mins $Therapeutic Exercise: 8-22 mins PT General Charges $$ ACUTE PT VISIT: 1 Visit                     Lyanne Co, PT  Acute Rehab Services Secure chat preferred Office [phone]    Louis Butler Louis Butler 06/09/2023, 4:57 PM

## 2023-06-09 NOTE — Plan of Care (Signed)
  Problem: Health Behavior/Discharge Planning: Goal: Ability to manage health-related needs will improve Outcome: Progressing   Problem: Clinical Measurements: Goal: Respiratory complications will improve Outcome: Progressing   Problem: Activity: Goal: Risk for activity intolerance will decrease Outcome: Progressing   Problem: Elimination: Goal: Will not experience complications related to urinary retention Outcome: Progressing   Problem: Education: Goal: Knowledge of disease and its progression will improve Outcome: Progressing   Problem: Fluid Volume: Goal: Compliance with measures to maintain balanced fluid volume will improve Outcome: Progressing   Problem: Health Behavior/Discharge Planning: Goal: Ability to manage health-related needs will improve Outcome: Progressing   Problem: Nutritional: Goal: Ability to make healthy dietary choices will improve Outcome: Progressing   Problem: Clinical Measurements: Goal: Complications related to the disease process, condition or treatment will be avoided or minimized Outcome: Progressing

## 2023-06-09 NOTE — Assessment & Plan Note (Addendum)
 History of hydronephrosis with obstruction.  Foley in place.  No intervention indicated

## 2023-06-10 DIAGNOSIS — N186 End stage renal disease: Secondary | ICD-10-CM | POA: Diagnosis not present

## 2023-06-10 LAB — RENAL FUNCTION PANEL
Albumin: 2.9 g/dL — ABNORMAL LOW (ref 3.5–5.0)
Anion gap: 14 (ref 5–15)
BUN: 46 mg/dL — ABNORMAL HIGH (ref 8–23)
CO2: 22 mmol/L (ref 22–32)
Calcium: 8.5 mg/dL — ABNORMAL LOW (ref 8.9–10.3)
Chloride: 92 mmol/L — ABNORMAL LOW (ref 98–111)
Creatinine, Ser: 4.62 mg/dL — ABNORMAL HIGH (ref 0.61–1.24)
GFR, Estimated: 13 mL/min — ABNORMAL LOW (ref >60)
Glucose, Bld: 209 mg/dL — ABNORMAL HIGH (ref 70–99)
Phosphorus: 2.5 mg/dL (ref 2.5–4.6)
Potassium: 3.3 mmol/L — ABNORMAL LOW (ref 3.5–5.1)
Sodium: 128 mmol/L — ABNORMAL LOW (ref 135–145)

## 2023-06-10 LAB — CBC
HCT: 25.5 % — ABNORMAL LOW (ref 39.0–52.0)
Hemoglobin: 8.3 g/dL — ABNORMAL LOW (ref 13.0–17.0)
MCH: 31.7 pg (ref 26.0–34.0)
MCHC: 32.5 g/dL (ref 30.0–36.0)
MCV: 97.3 fL (ref 80.0–100.0)
Platelets: 190 10*3/uL (ref 150–400)
RBC: 2.62 MIL/uL — ABNORMAL LOW (ref 4.22–5.81)
RDW: 16.1 % — ABNORMAL HIGH (ref 11.5–15.5)
WBC: 7.2 10*3/uL (ref 4.0–10.5)
nRBC: 0 % (ref 0.0–0.2)

## 2023-06-10 MED ORDER — HEPARIN SODIUM (PORCINE) 1000 UNIT/ML DIALYSIS: 20.0000 [IU]/kg | INTRAMUSCULAR | Status: DC | PRN

## 2023-06-10 NOTE — Progress Notes (Signed)
     Daily Progress Note Intern Pager: [phone]  Patient name: Louis Butler Medical record number: [id number] Date of birth: 02-Jul-1955 Age: 68 y.o. Gender: male  Primary Care Provider: Latrelle Dodrill, MD Consultants: Nephrology, CCM (signed off) Code Status: FULL  Pt Overview and Major Events to Date:  1/22: Admitted to ICU for CRRT 1/26: Stable from ICU, FMTS took over care 2/17: Insurance approved LTAC, awaiting bed availability 3/3: Urology not recommending Foley replacement   Assessment and Plan: Louis Butler is a 68 y.o. male with ESRD on HD (MWF) medically stable for discharge.  Awaiting LTAC placement, following with LCSW concerning discharge and bed availability at [facility].  Reportedly 4 discharges pending and bed may be available today or tomorrow. Assessment & Plan ESRD (end stage renal disease) (HCC) On HD MWF which will continue outpatient at [facility], no complications  - Nephrology following, appreciate recommendations - RFP on dialysis days (MWF) Orthostatic hypotension No acute events overnight, mild dizziness with standing. - Midodrine 10 mg 3 times daily with additional 10 mg Monday Wednesday Friday during dialysis - Fludrocortisone 0.2 mg daily - Target MAP >60 - Abdominal binders, compression stockings Hydronephrosis History of hydronephrosis with obstruction.  Foley in place.  No inpatient intervention indicated per [facility]. -Outpatient Urology follow up and Foley changes Hypokalemia -Fludrocortisone as above Hyponatremia Mild, stable.  Chronic and Stable Problems: ESRD: HD MWF Depressed mood: Continue Zoloft HTN: Holding home medications in setting of orthostasis GERD: Protonix 40 mg daily T2DM: A1c 6.8, will need CBG monitoring and adjustment to insulin regimen outpatient  Cancer of skin/ears/external auditory canal: Stable, continue scheduled Tylenol Anemia: Stable, transfuse for hemoglobin less than 7,  monitoring on HD days Neuropathy: Continue gabapentin 300 mg daily at bedtime  FEN/GI: Renal with fluid restriction PPx: Heparin Dispo: LTAC awaiting placement, medically stable for discharge.  Per Case Manager, working with [facility] to identify DCs for appropriate transfer.  Subjective:  Doing well, no new concerns.  Objective: Temp:  [97.6 F (36.4 C)-98.1 F (36.7 C)] 98.1 F (36.7 C) (03/05 0443) Pulse Rate:  [73-82] 82 (03/05 0443) Resp:  [18-19] 18 (03/05 0443) BP: (109-136)/(53-62) 136/62 (03/05 0443) SpO2:  [96 %-97 %] 97 % (03/05 0443)  Physical Exam: General: Alert and at baseline. Cardiovascular: RRR.  Normal S1/S2. No m/r/g. Pulmonary: CTAB.  Normal WOB on room air. Abdominal: Normoactive BS.  Nondistended, nontender. Skin: Warm and dry. Extremities: Capillary refill 2 seconds.  Laboratory: Most recent CBC Lab Results  Component Value Date   WBC 7.0 06/03/2023   HGB 8.9 (L) 06/03/2023   HCT 27.7 (L) 06/03/2023   MCV 100.0 06/03/2023   PLT 202 06/03/2023   Most recent BMP    Latest Ref Rng & Units 06/08/2023   10:07 AM  BMP  Glucose 70 - 99 mg/dL 696   BUN 8 - 23 mg/dL 52   Creatinine 2.95 - 1.24 mg/dL 2.84   Sodium 132 - 440 mmol/L 133   Potassium 3.5 - 5.1 mmol/L 3.3   Chloride 98 - 111 mmol/L 96   CO2 22 - 32 mmol/L 23   Calcium 8.9 - 10.3 mg/dL 8.4     Other pertinent labs: -None  New Imaging/Diagnostic Tests: -None  Alphonsine Minium, MD 06/10/2023, 7:55 AM  PGY-1, [facility] FPTS Intern pager: [phone], text pages welcome Secure chat group [facility] Teaching Service

## 2023-06-10 NOTE — Plan of Care (Signed)
  Problem: Clinical Measurements: Goal: Respiratory complications will improve Outcome: Completed/Met

## 2023-06-10 NOTE — Assessment & Plan Note (Signed)
 History of hydronephrosis with obstruction.  Foley in place.  No inpatient intervention indicated per [facility]. -Outpatient Urology follow up and Foley changes

## 2023-06-10 NOTE — Assessment & Plan Note (Signed)
-  Fludrocortisone as above

## 2023-06-10 NOTE — Progress Notes (Signed)
 Physical Therapy Treatment Patient Details Name: Louis Butler MRN: [id number] DOB: [birth year] Today's Date: 06/10/2023   History of Present Illness 68 y.o. male presented 1/22 with N/V/abd pain and was found to have marked hyperkalemia and renal failure. CRRT 1/23-1/24. 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 Ricard Dillon), DM, HTN, recent squamous cell ca H&N (surgery, XRT 2024), anemia.    PT Comments  Pt admitted with above diagnosis. Pt progressing increasing walking distance with rollator today with BP fluctuation but pt asymptomatic. Pt continues to make progress and was supervision and CGA for safety today.  Continue to progress pt as able. Pt currently with functional limitations due to the deficits listed below (see PT Problem List). Pt will benefit from acute skilled PT to increase their independence and safety with mobility to allow discharge.      Supine 73 bpm, 144/65 on entry to room without binder, TEDS and ace wraps 80 bpm, 145/71 with abdominal binder, TEDS and ace wraps  Sitting 145/71, 85 bpm  Walk  114/71, 102 bpm Sitting after first walk 134/73, 97 bpm Sitting after second walk 133/78, 99 bpm Sitting after 3rd walk 143/74, 98 bpm Pt asymptomatic throughout session.    If plan is discharge home, recommend the following: A little help with walking and/or transfers;A little help with bathing/dressing/bathroom;Assistance with cooking/housework;Assist for transportation;Help with stairs or ramp for entrance   Can travel by private vehicle     Yes  Equipment Recommendations  Rollator (4 wheels)    Recommendations for Other Services       Precautions / Restrictions Precautions Precautions: Fall Recall of Precautions/Restrictions: Intact Precaution/Restrictions Comments: Monitor BP. Orthostatic - has abd binder, ace wraps, and thigh high compression stockings Restrictions Weight Bearing Restrictions Per Provider Order: No     Mobility  Bed Mobility Overal bed mobility: Modified  Independent Bed Mobility: Supine to Sit, Sit to Supine     Supine to sit: Supervision, HOB elevated, Used rails Sit to supine: Supervision, HOB elevated, Used rails   General bed mobility comments: Supervision for safety, extra time, no physical assist needed    Transfers Overall transfer level: Needs assistance Equipment used: Rollator (4 wheels) Transfers: Sit to/from Stand Sit to Stand: Supervision                Ambulation/Gait Ambulation/Gait assistance: Contact guard assist Gait Distance (Feet): 100 Feet (100 feet x3) Assistive device: Rollator (4 wheels) Gait Pattern/deviations: Step-through pattern, Decreased stride length, Trunk flexed, Wide base of support Gait velocity: decreased Gait velocity interpretation: 1.31 - 2.62 ft/sec, indicative of limited community ambulator   General Gait Details: Pt able to take 3 walks of 100 feet each time and  then seated rest break. BP stable and pt asymptomatic throughout.   Stairs             Wheelchair Mobility     Tilt Bed    Modified Rankin (Stroke Patients Only)       Balance Overall balance assessment: Needs assistance Sitting-balance support: No upper extremity supported, Feet supported Sitting balance-Leahy Scale: Good Sitting balance - Comments: no LOB with static sitting, no use of UE support   Standing balance support: During functional activity, No upper extremity supported, Single extremity supported, Bilateral upper extremity supported Standing balance-Leahy Scale: Fair Standing balance comment: can static stand without UE support or walk with single UE support, mostly BUE support for endurance  Communication Communication Communication: Impaired Factors Affecting Communication: Hearing impaired (hears better out of R ear)  Cognition Arousal: Alert Behavior During Therapy: WFL for tasks assessed/performed   PT - Cognitive impairments: History of  cognitive impairments                       PT - Cognition Comments: Seems to be at baseline, slow to respond at times but partly due to [facility] Following commands: Intact      Cueing Cueing Techniques: Verbal cues, Gestural cues  Exercises General Exercises - Lower Extremity Ankle Circles/Pumps: AROM, Both, 10 reps, Supine Long Arc Quad: Strengthening, Both, Seated, 5 reps    General Comments General comments (skin integrity, edema, etc.): compression stocking, abdominal binder, and ACE wraps used.      Pertinent Vitals/Pain Pain Assessment Pain Assessment: Faces Faces Pain Scale: Hurts little more Breathing: normal Negative Vocalization: none Facial Expression: smiling or inexpressive Body Language: relaxed Consolability: no need to console PAINAD Score: 0 Pain Location: chronic back pain Pain Descriptors / Indicators: Discomfort, Grimacing Pain Intervention(s): Limited activity within patient's tolerance, Monitored during session, Repositioned    Home Living                          Prior Function            PT Goals (current goals can now be found in the care plan section) Acute Rehab PT Goals Patient Stated Goal: to be able to leave his house for "fun" activity Progress towards PT goals: Progressing toward goals    Frequency    Min 1X/week      PT Plan      Co-evaluation              AM-PAC PT "6 Clicks" Mobility   Outcome Measure  Help needed turning from your back to your side while in a flat bed without using bedrails?: None Help needed moving from lying on your back to sitting on the side of a flat bed without using bedrails?: None Help needed moving to and from a bed to a chair (including a wheelchair)?: A Little Help needed standing up from a chair using your arms (e.g., wheelchair or bedside chair)?: A Little Help needed to walk in hospital room?: A Little Help needed climbing 3-5 steps with a railing? : A Lot 6 Click  Score: 19    End of Session Equipment Utilized During Treatment: Gait belt Activity Tolerance: Patient tolerated treatment well Patient left: in bed;with call bell/phone within reach;with family/visitor present Nurse Communication: Mobility status PT Visit Diagnosis: Unsteadiness on feet (R26.81);Muscle weakness (generalized) (M62.81);Difficulty in walking, not elsewhere classified (R26.2);Dizziness and giddiness (R42)     Time: 5784-6962 PT Time Calculation (min) (ACUTE ONLY): 38 min  Charges:    $Gait Training: 23-37 mins $Therapeutic Exercise: 8-22 mins PT General Charges $$ ACUTE PT VISIT: 1 Visit                     Marselino Slayton M,PT Acute Rehab Services [phone]    [name] 06/10/2023, 2:29 PM

## 2023-06-10 NOTE — Assessment & Plan Note (Signed)
 Mild, stable.

## 2023-06-10 NOTE — Assessment & Plan Note (Signed)
 On HD MWF which will continue outpatient at [facility], no complications  - Nephrology following, appreciate recommendations - RFP on dialysis days (MWF)

## 2023-06-10 NOTE — Assessment & Plan Note (Signed)
 No acute events overnight, mild dizziness with standing. - Midodrine 10 mg 3 times daily with additional 10 mg Monday Wednesday Friday during dialysis - Fludrocortisone 0.2 mg daily - Target MAP >60 - Abdominal binders, compression stockings

## 2023-06-10 NOTE — Progress Notes (Signed)
 Patient ID: Louis Butler, male   DOB: March 06, 1956, 68 y.o.   MRN: [id number] [facility] Progress Note   Assessment/ Plan:   1.  Deconditioning status post hospitalization: Continue with plans for rehab as able.  Orthostatic blood pressures on treatment with midodrine and florinef. LTAC/[facility] pending  2. ESRD: Continue hemodialysis on MWF schedule. [facility] to resume dialysis after discharge from [facility] however may not be able to hold spot while he's at [facility].  3. Anemia: Low but relatively stable hemoglobin and hematocrit.  Holding off on ESA with recent head/neck cancer. Transfuse prn. Fe deplete. 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 Nutrition: Continue protein/nutritional supplementation.  Renal diet 6. Hypotension: ACTH stimulation test negative for adrenal insufficiency.  Adjust midodrine as needed.  Fludrocortisone ineffective given he is ESRD.  He is already sodium avid 7.  Mild hypokalemia: Trend and use added K bath  Dispo: pending bed at [facility]    Subjective:    No new issues For HD today    Objective:   BP (!) 123/49   Pulse 77   Temp 97.9 F (36.6 C) (Oral)   Resp 18   Ht 6\' 3"  (1.905 m)   Wt 83.1 kg   SpO2 97%   BMI 22.90 kg/m   Physical Exam: Gen: NAD CVS: Normal rate Resp: Bilateral chest rise no increased work of breathing Abd: Soft, flat, nontender, bowel sounds normal Ext: No lower extremity edema.  Left upper arm AV fistula Neuro: awake, alert  Labs: BMET Recent Labs  Lab 06/05/23 0936 06/08/23 1007  NA 131* 133*  K 3.1* 3.3*  CL 96* 96*  CO2 21* 23  GLUCOSE 171* 164*  BUN 62* 52*  CREATININE 5.39* 4.86*  CALCIUM 8.2* 8.4*  PHOS 3.3 3.6   CBC No results for input(s): "WBC", "NEUTROABS", "HGB", "HCT", "MCV", "PLT" in the last 168 hours.     Medications:     cholecalciferol  5,000 Units Oral Once per day on Monday Thursday   gabapentin   300 mg Oral QHS   heparin injection (subcutaneous)  5,000 Units Subcutaneous Q8H   lidocaine  1 Application Urethral Once   midodrine  10 mg Oral TID WC   midodrine  10 mg Oral Q M,W,F-HD   multivitamin  1 tablet Oral QHS   pantoprazole  40 mg Oral Daily   sertraline  50 mg Oral Daily   tamsulosin  0.4 mg Oral Daily    Arita Miss  06/10/2023, 12:09 PM

## 2023-06-10 NOTE — Plan of Care (Signed)
  Problem: Activity: Goal: Risk for activity intolerance will decrease Outcome: Progressing   Problem: Education: Goal: Knowledge of disease and its progression will improve Outcome: Progressing   Problem: Fluid Volume: Goal: Compliance with measures to maintain balanced fluid volume will improve Outcome: Progressing   Problem: Clinical Measurements: Goal: Complications related to the disease process, condition or treatment will be avoided or minimized Outcome: Progressing

## 2023-06-10 NOTE — Progress Notes (Signed)
 Received patient in bed to unit.  Alert and oriented.  Informed consent signed and in chart.   TX duration: 3 hours  Patient tolerated well.  Transported back to the room  Alert, without acute distress.  Hand-off given to patient's nurse.   Access used: Left AV Fistula Upper Arm Access issues: none  Total UF removed: 3L Medication(s) given: Iron Sucrose, scheduled midodrine   06/10/23 1755  Vitals  Temp 98.1 F (36.7 C)  Temp Source Oral  BP (!) 114/56  MAP (mmHg) 82  Pulse Rate 74  ECG Heart Rate 76  Resp 19  Oxygen Therapy  SpO2 98 %  O2 Device Room Air  During Treatment Monitoring  Dialysate Potassium Concentration 3  Dialysate Calcium Concentration 2.5  Duration of HD Treatment -hour(s) 3 hour(s)  HD Safety Checks Performed Yes  Intra-Hemodialysis Comments Tx completed  Dialysis Fluid Bolus Normal Saline  Bolus Amount (mL) 300 mL  Post Treatment  Dialyzer Clearance Clear  Liters Processed 72  Fluid Removed (mL) 2000 mL  Tolerated HD Treatment Yes  AVG/AVF Arterial Site Held (minutes) 7 minutes  AVG/AVF Venous Site Held (minutes) 7 minutes  Fistula / Graft Left Upper arm  No placement date or time found.   Placed prior to admission: Yes  Orientation: Left  Access Location: Upper arm  Status Deaccessed     Stacie Glaze LPN [facility]

## 2023-06-11 DIAGNOSIS — Z789 Other specified health status: Secondary | ICD-10-CM

## 2023-06-11 MED ORDER — IRON SUCROSE 200 MG IVPB - SIMPLE MED: 200.0000 mg | Status: AC

## 2023-06-11 MED ORDER — SERTRALINE HCL 50 MG PO TABS: 50.0000 mg | ORAL_TABLET | Freq: Every day | ORAL | Status: DC

## 2023-06-11 NOTE — Discharge Summary (Deleted)
 Family Medicine Teaching [facility] Discharge Summary  Patient name: Louis Butler Medical record number: [id number] Date of birth: 1955-09-07 Age: 68 y.o. 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  Acute on Chronic Renal Failure  Electrolyte disturbance Patient was admitted to ICU and started on CRRT for renal failure, had great improvement of electrolyte disturbances when this was initiated.  Tolerated transition to intermittent HD well in hospital.  LUE AVF creation performed on 1/28.  CLIP process initiated and was continued on MWF schedule at [facility].  At time of discharge he tolerated HD well.   Hypotension Presented with hypotension requiring pressor support, was initially admitted to the ICU.  Pressures improved with CRRT, and he was off pressors by 1/26 when he transitioned to the  floor. Intermittently hypotensive after starting HD.  Was continued on midodrine 10 mg TID to improve pressures.  He continued to have hypotension which hindered his discharge to appropriate facility, so he was started on midodrine 10 mg 3 times daily, with 10 mg as needed prior to HD.  He continued to have profound orthostasis.  ACTH stim test was equivocal. Per Nephro, proceeded with fludrocortisone treatment, which produced mild improvement in patient's symptoms.  After discussion with the patient and his wife, he was discharged to [facility] upon bed becoming available approximately 2 weeks later.  Final regimen included midodrine 10 mg 3 times daily and 0.2 mg of fludrocortisone once daily.  Altered Mental Status Patient was encephalopathic on admission, suspected to be secondary to uremia and hyperkalemia, which resolved with initiation of dialysis.  At time of discharge patient was alert and oriented x4 and at baseline.  Bilateral Hydronephrosis obstructive uropathy  Pseudomonal UTI S/p Zosyn (1/24-1/27) with transition to Cefepime (1/27-1/30) in the hospital.  Per Urology recommendations, will keep Foley in place until outpatient follow-up.  Patient initially thought to require Foley exchange this hospitalization, but will keep Foley in place.  Anemia Hemoglobin remained around 7-8 while in the hospital.  Thought to be related to anemia of chronic disease.  Was given ESA. 1u pRBC given for hgb 6.7 on 1/29 likely from blood draws.  Other chronic conditions were medically managed with home medications and formulary alternatives as necessary (GERD).  Follow-up recommendations Patient will need urology follow up when discharged regarding Foley changes. Blood pressure medication held due to soft pressures.  Additionally, midodrine and fludrocortisone started for hypotension. Consider discontinuation of these and  addition of blood pressure medication if pressures are elevated. Please ensure follow-up  with [facility]. Stage II sacral ulcer, monitor and refer to wound care clinic if necessary. Please monitor CBGs outpatient and adjust insulin regimen as needed.  Disposition: LTAC  Discharge Condition: Stable  Discharge Exam:  Vitals:   06/12/23 1209 06/12/23 1703  BP: 114/62 (!) 133/57  Pulse: 73 73  Resp:  18  Temp:  97.9 F (36.6 C)  SpO2: 98% 100%   Physical Exam: ESRD: HD MWF Depressed mood: Continue Zoloft HTN: Holding home medications in setting of orthostasis GERD: Protonix 40 mg daily T2DM: A1c 6.8, will need CBG monitoring and adjustment to insulin regimen outpatient Cancer of skin/ears/external auditory canal: Stable, continue scheduled Tylenol Anemia: Stable, transfuse for hemoglobin less than 7, monitoring on HD days Neuropathy: Continue gabapentin 300 mg daily at bedtime  Significant Procedures: -Brief CRRT for admission -AVF creation 1/28 -1 unit PRBC 1/29   Significant Labs and Imaging:  No results for input(s): "WBC", "HGB", "HCT", "PLT" in the last 48 hours.  Recent Labs  Lab 06/12/23 0830  NA 127*  K 3.4*  CL 92*  CO2 22  GLUCOSE 218*  BUN 43*  CREATININE 4.53*  CALCIUM 8.4*  PHOS 2.8  ALBUMIN 3.0*    Results/Tests Pending at Time of Discharge: None  Discharge Medications:  Allergies as of 06/12/2023       Reactions   Chlorhexidine Gluconate Itching   Hydrocodone Hives, Itching   Percocet [oxycodone-acetaminophen] Hives, Itching        Medication List     STOP taking these medications    amLODipine 5 MG tablet Commonly known as: NORVASC   carvedilol 25 MG tablet Commonly known as: COREG   Lantus SoloStar 100 UNIT/ML Solostar Pen Generic drug: insulin glargine   sodium bicarbonate 650 MG tablet   traMADol 50 MG tablet Commonly known as: Ultram   trimethoprim 100 MG tablet Commonly known as: TRIMPEX   Veltassa 8.4 g packet Generic drug: patiromer       TAKE these medications    acetaminophen 500 MG  tablet Commonly known as: TYLENOL Take 1,000 mg by mouth every 6 (six) hours as needed for headache (pain).   blood glucose meter kit and supplies Kit Dispense based on patient and insurance preference. Use up to four times daily as directed. (FOR ICD-9 250.00, 250.01).   esomeprazole 40 MG capsule Commonly known as: NEXIUM Take 1 capsule (40 mg total) by mouth daily as needed (for heartburn). What changed: See the new instructions.   gabapentin 300 MG capsule Commonly known as: NEURONTIN Take 1 capsule (300 mg total) by mouth at bedtime.   Insulin Syringes (Disposable) U-100 0.5 ML Misc Use as per instructions 3 times daily AC.   iron sucrose Commonly known as: VENOFER Inject 110 mLs (200 mg total) into the vein every Monday, Wednesday, and Friday with hemodialysis.   loperamide 2 MG tablet Commonly known as: IMODIUM A-D Take 1 tablet (2 mg total) by mouth 4 (four) times daily as needed for diarrhea or loose stools.   midodrine 10 MG tablet Commonly known as: PROAMATINE Take 1 tablet (10 mg total) by mouth 3 (three) times daily with meals.   midodrine 10 MG tablet Commonly known as: PROAMATINE Take 1 tablet (10 mg total) by mouth every dialysis (give 15-30 minutes before dialysis treatments).   multivitamin Tabs tablet Take 1 tablet by mouth at bedtime.   mupirocin ointment 2 % Commonly known as: [employer]  Apply 1 Application topically 2 (two) times daily. Left ear canal   polyethylene glycol 17 g packet Commonly known as: MIRALAX / GLYCOLAX Take 17 g by mouth daily as needed for moderate constipation.   sertraline 50 MG tablet Commonly known as: ZOLOFT Take 1 tablet (50 mg total) by mouth daily.   tamsulosin 0.4 MG Caps capsule Commonly known as: FLOMAX Take 0.4 mg by mouth daily.   Vitamin D3 125 MCG (5000 UT) Caps Take 5,000 Units by mouth daily.               Durable Medical Equipment  (From admission, onward)           Start     Ordered    05/22/23 1622  For home use only DME 4 wheeled rolling walker with seat  Once       Question:  Patient needs a walker to treat with the following condition  Answer:  Gait instability   05/22/23 1621   05/04/23 1653  For home use only DME Hospital bed  Once       Comments: Patient requires frequent changes in body position ways not feasible with a regular bed.  Question Answer Comment  Length of Need Lifetime   The above medical condition requires: Patient requires the ability to reposition frequently   Head must be elevated greater than: 45 degrees   Bed type Semi-electric   Support Surface: Gel Overlay      05/04/23 1659   05/04/23 1642  For home use only DME Overbed table  Once        05/04/23 1641            Discharge Instructions: Please refer to Patient Instructions section of EMR for full details.  Patient was counseled important signs and symptoms that should prompt return to medical care, changes in medications, dietary instructions, activity restrictions, and follow up appointments.   Follow-Up Appointments: No future appointments.  Sharion Dove Jorel Gravlin, MD 06/12/2023, 7:30 PM PGY-1, [facility]

## 2023-06-11 NOTE — Assessment & Plan Note (Signed)
-  Fludrocortisone as above

## 2023-06-11 NOTE — Progress Notes (Signed)
 Patient ID: Louis Butler, male   DOB: [birth year], 68 y.o.   MRN: [id number] [facility] Progress Note   Assessment/ Plan:   1.  Deconditioning status post hospitalization: Continue with plans for rehab as able.  Orthostatic blood pressures on treatment with midodrine and florinef. [facility] pending  2. ESRD: Continue hemodialysis on MWF schedule. [facility] to resume dialysis after discharge from [facility] however may not be able to hold spot while he's at [facility].  Kidney navigator following. 3. Anemia: Low but relatively stable hemoglobin and hematocrit.  Holding off on ESA with recent head/neck cancer. Transfuse prn. Fe deplete. 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 Nutrition: Continue protein/nutritional supplementation.  Renal diet 6. Hypotension: ACTH stimulation test negative for adrenal insufficiency.  Adjust midodrine as needed.  Fludrocortisone ineffective given he is ESRD.  He is already sodium avid 7.  Mild hypokalemia: Trend and use added K bath 8.  Mild hyponatremia, UF/HD  Dispo: pending bed at [facility]    Subjective:    HD yesterday with 2 L ultrafiltration, no complaints this morning    Objective:   BP 133/64 (BP Location: Right Arm)   Pulse 73   Temp 98 F (36.7 C) (Oral)   Resp 18   Ht 6\' 3"  (1.905 m)   Wt 82.4 kg   SpO2 97%   BMI 22.71 kg/m   Physical Exam: Gen: NAD CVS: Normal rate Resp: Bilateral chest rise no increased work of breathing Abd: Soft, flat, nontender, bowel sounds normal Ext: No lower extremity edema.  Left upper arm AV fistula Neuro: awake, alert  Labs: BMET Recent Labs  Lab 06/05/23 0936 06/08/23 1007 06/10/23 1440  NA 131* 133* 128*  K 3.1* 3.3* 3.3*  CL 96* 96* 92*  CO2 21* 23 22  GLUCOSE 171* 164* 209*  BUN 62* 52* 46*  CREATININE 5.39* 4.86* 4.62*  CALCIUM 8.2* 8.4* 8.5*  PHOS 3.3 3.6 2.5   CBC Recent Labs  Lab  06/10/23 1439  WBC 7.2  HGB 8.3*  HCT 25.5*  MCV 97.3  PLT 190       Medications:     cholecalciferol  5,000 Units Oral Once per day on Monday Thursday   gabapentin  300 mg Oral QHS   heparin injection (subcutaneous)  5,000 Units Subcutaneous Q8H   lidocaine  1 Application Urethral Once   midodrine  10 mg Oral TID WC   midodrine  10 mg Oral Q M,W,F-HD   multivitamin  1 tablet Oral QHS   pantoprazole  40 mg Oral Daily   sertraline  50 mg Oral Daily   tamsulosin  0.4 mg Oral Daily    Arita Miss  06/11/2023, 11:15 AM

## 2023-06-11 NOTE — Progress Notes (Signed)
 OT Cancellation Note  Patient Details Name: Louis Butler MRN: [id number] DOB: 11-18-1955   Cancelled Treatment:    Reason Eval/Treat Not Completed: Patient declined, no reason specified (Pt declined therapy and reports not feeling well today. Refused mobility with MS team earlier, educated pt on the benefits of mobility to reduce further decline. OT will follow-up with pt as able.)  06/11/2023  AB, OTR/L  Acute Rehabilitation Services  Office: [phone]   Tristan Schroeder 06/11/2023, 6:09 PM

## 2023-06-11 NOTE — Plan of Care (Signed)
   Problem: Elimination: Goal: Will not experience complications related to urinary retention Outcome: Completed/Met

## 2023-06-11 NOTE — Assessment & Plan Note (Signed)
 On HD MWF which will continue outpatient at [facility], no complications  - Nephrology following, appreciate recommendations - RFP on dialysis days (MWF)

## 2023-06-11 NOTE — Progress Notes (Signed)
 Mobility Specialist Progress Note:    06/11/23 1500  Mobility  Activity Refused mobility  Mobility Specialist Start Time (ACUTE ONLY) 1448   Pt refused mobility, no reason specified. Just stated "I'm not walking today". Will f/u as able.    D'Vante Earlene Plater Mobility Specialist Please contact via Special educational needs teacher or Rehab office at [phone]

## 2023-06-11 NOTE — Assessment & Plan Note (Signed)
 Mild, stable.

## 2023-06-11 NOTE — Progress Notes (Signed)
 Update provided to [facility] admissions regarding awaiting bed at [facility]. Will need to cancel referral to [facility] if/when pt is d/c to [facility]. Hesitant to cancel referral at this time in the event there is a change in d/c plan. Will assist as needed.   Olivia Canter Renal Navigator [phone]

## 2023-06-11 NOTE — Progress Notes (Signed)
 Daily Progress Note Intern Pager: [phone]  Patient name: Louis Butler Medical record number: [id number] Date of birth: [birth year] Age: 68 y.o. Gender: male  Primary Care Provider: Latrelle Dodrill, MD Consultants: Nephrology, CCM (signed off) Code Status: FULL  Pt Overview and Major Events to Date:  1/22: Admitted to ICU for CRRT 1/26: Stable from ICU, [facility]MTS took over care 2/17: Insurance approved LTAC, awaiting bed availability 3/3: Urology not recommending Foley replacement  Assessment and Plan: Louis Butler is a 68 y.o. male with ESRD on HD (MWF) who presented with renal failure and was admitted to ICU for CRRT then transferred back to [facility], currently medically stable for discharge to [facility].  Per LTAC representative: "We have an HD patient leaving hopefully today if we can secure transport. If not, they will leave tomorrow and we will bring Louis Butler over to [facility]." Assessment & Plan ESRD (end stage renal disease) (HCC) On HD MWF which will continue outpatient at [facility], no complications  - Nephrology following, appreciate recommendations - RFP on dialysis days (MWF) Orthostatic hypotension No acute events overnight, mild dizziness with standing. - Midodrine 10 mg 3 times daily with additional 10 mg Monday Wednesday Friday during HD - Fludrocortisone 0.2 mg daily - Target MAP >60 - Abdominal binders, compression stockings Hydronephrosis History of hydronephrosis with obstruction.  Foley in place.  No inpatient intervention indicated per [facility]. -Outpatient Urology follow up and Foley changes Hypokalemia -Fludrocortisone as above Hyponatremia Mild, stable. Chronic health problem ESRD: HD MWF Depressed mood: Continue Zoloft HTN: Holding home medications in setting of orthostasis GERD: Protonix 40 mg daily T2DM: A1c 6.8, will need CBG monitoring and adjustment to insulin regimen outpatient Cancer of skin/ears/external auditory  canal: Stable, continue scheduled Tylenol Anemia: Stable, transfuse for hemoglobin less than 7, monitoring on HD days Neuropathy: Continue gabapentin 300 mg daily at bedtime  FEN/GI: Renal w/ fluid restriction PPx: Heparin Dispo: LTAC pending bed availability.  Subjective:  Doing well, no new concerns.  Objective: Temp:  [97.8 F (36.6 C)-99.3 F (37.4 C)] 98 F (36.7 C) (03/06 0733) Pulse Rate:  [71-77] 73 (03/06 0733) Resp:  [14-19] 18 (03/06 0533) BP: (104-147)/(49-64) 133/64 (03/06 0733) SpO2:  [93 %-100 %] 97 % (03/06 0733) Weight:  [82.4 kg-85.4 kg] 82.4 kg (03/05 1803)  Physical Exam: General: Elderly, deconditioned male resting comfortably in bed, NAD, alert and at baseline. Cardiovascular: Regular rate and rhythm. Normal S1/S2. No murmurs, rubs, or gallops appreciated. 2+ radial pulses. Pulmonary: Clear bilaterally to ascultation. No increased WOB, no accessory muscle usage on room air. No wheezes, crackles, or rhonchi. Abdominal: Normoactive bowel sounds, nondistended. No tenderness to deep or light palpation. No rebound or guarding. Foley catheter in place and clean. Skin: Warm and dry. Extremities: LUE fistula with 2+ palpable thrill.  No peripheral edema bilaterally.  Capillary refill <2 seconds.  Laboratory: Most recent CBC Lab Results  Component Value Date   WBC 7.2 06/10/2023   HGB 8.3 (L) 06/10/2023   HCT 25.5 (L) 06/10/2023   MCV 97.3 06/10/2023   PLT 190 06/10/2023   Most recent BMP    Latest Ref Rng & Units 06/10/2023    2:40 PM  BMP  Glucose 70 - 99 mg/dL 147   BUN 8 - 23 mg/dL 46   Creatinine 8.29 - 1.24 mg/dL 5.62   Sodium 130 - 865 mmol/L 128   Potassium 3.5 - 5.1 mmol/L 3.3   Chloride 98 - 111 mmol/L 92  CO2 22 - 32 mmol/L 22   Calcium 8.9 - 10.3 mg/dL 8.5     Other pertinent labs: -None  New Imaging/Diagnostic Tests: -None  Louis Gacek, MD 06/11/2023, 8:30 AM  PGY-1, [facility] Family [facility] FPTS Intern pager: [phone],  text pages welcome Secure chat group [facility] Teaching Service

## 2023-06-11 NOTE — Assessment & Plan Note (Signed)
 ESRD: HD MWF Depressed mood: Continue Zoloft HTN: Holding home medications in setting of orthostasis GERD: Protonix 40 mg daily T2DM: A1c 6.8, will need CBG monitoring and adjustment to insulin regimen outpatient Cancer of skin/ears/external auditory canal: Stable, continue scheduled Tylenol Anemia: Stable, transfuse for hemoglobin less than 7, monitoring on HD days Neuropathy: Continue gabapentin 300 mg daily at bedtime

## 2023-06-11 NOTE — Assessment & Plan Note (Signed)
 History of hydronephrosis with obstruction.  Foley in place.  No inpatient intervention indicated per [facility]. -Outpatient Urology follow up and Foley changes

## 2023-06-11 NOTE — Assessment & Plan Note (Addendum)
 No acute events overnight, mild dizziness with standing. - Midodrine 10 mg 3 times daily with additional 10 mg Monday Wednesday Friday during HD - Fludrocortisone 0.2 mg daily - Target MAP >60 - Abdominal binders, compression stockings

## 2023-06-12 LAB — RENAL FUNCTION PANEL
Albumin: 3 g/dL — ABNORMAL LOW (ref 3.5–5.0)
Anion gap: 13 (ref 5–15)
BUN: 43 mg/dL — ABNORMAL HIGH (ref 8–23)
CO2: 22 mmol/L (ref 22–32)
Calcium: 8.4 mg/dL — ABNORMAL LOW (ref 8.9–10.3)
Chloride: 92 mmol/L — ABNORMAL LOW (ref 98–111)
Creatinine, Ser: 4.53 mg/dL — ABNORMAL HIGH (ref 0.61–1.24)
GFR, Estimated: 13 mL/min — ABNORMAL LOW (ref >60)
Glucose, Bld: 218 mg/dL — ABNORMAL HIGH (ref 70–99)
Phosphorus: 2.8 mg/dL (ref 2.5–4.6)
Potassium: 3.4 mmol/L — ABNORMAL LOW (ref 3.5–5.1)
Sodium: 127 mmol/L — ABNORMAL LOW (ref 135–145)

## 2023-06-12 MED ORDER — SODIUM CHLORIDE 0.9 % IV SOLN
200.0000 mg | INTRAVENOUS | Status: DC
  Filled 2023-06-12: qty 10

## 2023-06-12 NOTE — Assessment & Plan Note (Signed)
 No acute events overnight, mild dizziness with standing. - Midodrine 10 mg 3 times daily with additional 10 mg Monday Wednesday Friday during HD - Fludrocortisone 0.2 mg daily - Target MAP >60 - Abdominal binders, compression stockings

## 2023-06-12 NOTE — Assessment & Plan Note (Signed)
 History of hydronephrosis with obstruction.  Foley in place.  No inpatient intervention indicated per [facility]. -Outpatient Urology follow up and Foley changes

## 2023-06-12 NOTE — Progress Notes (Signed)
 Patient ID: Louis Butler, male   DOB: 06-28-1955, 68 y.o.   MRN: [id number] [facility] Progress Note   Assessment/ Plan:   1.  Deconditioning status post hospitalization: Continue with plans for rehab as able.  Orthostatic blood pressures on treatment with midodrine. [facility] pending  2. ESRD: Continue hemodialysis on MWF schedule. [facility] to resume dialysis after discharge from [facility] however may not be able to hold spot while he's at [facility].  Kidney navigator following. 3. Anemia: Low but relatively stable hemoglobin and hematocrit.  Holding off on ESA with recent head/neck cancer. Transfuse prn. Fe deplete. IV  fe with hd mwf 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 Nutrition: Continue protein/nutritional supplementation.  Renal diet 6. Hypotension: ACTH stimulation test negative for adrenal insufficiency.  Adjust midodrine as needed.  Fludrocortisone ineffective given he is ESRD.  He is already sodium avid 7.  Mild hypokalemia: Trend and use added K bath 8.  Mild hyponatremia, UF/HD  Dispo: pending bed at [facility]    Subjective:    Doing well this AM on HD No c/o    Objective:   BP (!) 129/59   Pulse 81   Temp 98.3 F (36.8 C)   Resp 16   Ht 6\' 3"  (1.905 m)   Wt 84 kg   SpO2 95%   BMI 23.15 kg/m   Physical Exam: Gen: NAD CVS: Normal rate Resp: Bilateral chest rise no increased work of breathing Abd: Soft, flat, nontender, bowel sounds normal Ext: No lower extremity edema.  Left upper arm AV fistula Neuro: awake, alert  Labs: BMET Recent Labs  Lab 06/08/23 1007 06/10/23 1440 06/12/23 0830  NA 133* 128* 127*  K 3.3* 3.3* 3.4*  CL 96* 92* 92*  CO2 23 22 22   GLUCOSE 164* 209* 218*  BUN 52* 46* 43*  CREATININE 4.86* 4.62* 4.53*  CALCIUM 8.4* 8.5* 8.4*  PHOS 3.6 2.5 2.8   CBC Recent Labs  Lab 06/10/23 1439  WBC 7.2  HGB 8.3*  HCT 25.5*  MCV 97.3  PLT 190        Medications:     cholecalciferol  5,000 Units Oral Once per day on Monday Thursday   gabapentin  300 mg Oral QHS   heparin injection (subcutaneous)  5,000 Units Subcutaneous Q8H   lidocaine  1 Application Urethral Once   midodrine  10 mg Oral TID WC   midodrine  10 mg Oral Q M,W,F-HD   multivitamin  1 tablet Oral QHS   pantoprazole  40 mg Oral Daily   sertraline  50 mg Oral Daily   tamsulosin  0.4 mg Oral Daily    Arita Miss  06/12/2023, 9:37 AM

## 2023-06-12 NOTE — Progress Notes (Signed)
 Nutrition Follow-up  DOCUMENTATION CODES:   Non-severe (moderate) malnutrition in context of chronic illness  INTERVENTION:  Encourage adequate PO intake; double protein portions with meals Renal MVI with minerals daily Liberalize diet to regular Magic Cup BID, Might Shake w/ breakfast  NUTRITION DIAGNOSIS:  Moderate Malnutrition related to chronic illness (renal disease) as evidenced by moderate fat depletion, mild muscle depletion, moderate muscle depletion, percent weight loss (16% weight loss within 6 months). - remains applicable  GOAL:  Patient will meet greater than or equal to 90% of their needs - progressing  MONITOR:  PO intake, Supplement acceptance, Skin  REASON FOR ASSESSMENT:  Consult Assessment of nutrition requirement/status, Diet education  ASSESSMENT:   68 yo male admitted with acute on chronic renal failure. PMH includes ESRD-stopped HD 06/2020, DM-2, GERD, HTN.  Pt nutritionally stable. He does voice frustration with the restrictive renal menu. Discussed with nephrology, as patient remains on 4K bath and with hypokalemia. Also low normal phosphorus levels. Nephrology amicable to liberalizing diet to regular with fluid restriction.  Average Meal Intake: 3/4: 100% x3 documented meals 3/5: 100% x1 documented meal 3/7: 50% x1 documented meal  Appetite remains adequate/stable. Endorses no difficulties with chewing or swallowing.  He is edentulous. No reported N/V/C/D. Conducted new nutrition-focused physical exam. He remains appropriate for moderate malnutrition dx in the context of his chronic illness. Weight is stable and has trended up suggesting adequate intake.   Admit Weight: 76.3kg Current Weight: 82.3kg Lowest Weight: 76.6kg on 1/24  Some mild edema noted to BLEs. Not significant. UOP yesterday. 1.5L UF today at HD tx.   Intake/Output Summary (Last 24 hours) at 06/12/2023 1904 Last data filed at 06/12/2023 1300 Gross per 24 hour  Intake  360 ml  Output 2850 ml  Net -2490 ml    Net IO Since Admission: -48,544.87 mL [06/12/23 1904]   Meds: cholecalciferol, renal MVI, pantoprazole  Labs:  K+ 3.4 (L) Na+ 127 (L) CBGs 209-218 x48 hours  NUTRITION - FOCUSED PHYSICAL EXAM: Flowsheet Row Most Recent Value  Orbital Region Moderate depletion  Upper Arm Region Moderate depletion  Thoracic and Lumbar Region Moderate depletion  Buccal Region Moderate depletion  Temple Region Moderate depletion  Clavicle Bone Region Moderate depletion  Clavicle and Acromion Bone Region Moderate depletion  Scapular Bone Region Moderate depletion  Dorsal Hand Moderate depletion  Patellar Region Moderate depletion  Anterior Thigh Region Moderate depletion  Posterior Calf Region Moderate depletion  Edema (RD Assessment) None  Hair Reviewed  Eyes Reviewed  Mouth Reviewed  [endentulous]  Skin Reviewed  Nails Reviewed    Diet Order:   Diet Order             Diet renal with fluid restriction Fluid restriction: 1500 mL Fluid; Room service appropriate? Yes; Fluid consistency: Thin  Diet effective now            EDUCATION NEEDS:  Education needs have been addressed  Skin:  Skin Assessment: Reviewed RN Assessment Skin Integrity Issues:: Stage II Stage II: R buttocks  Last BM:  3/6  Height:  Ht Readings from Last 1 Encounters:  04/29/23 6\' 3"  (1.905 m)   Weight:  Wt Readings from Last 1 Encounters:  06/12/23 82.3 kg   Ideal Body Weight:  89.1 kg  BMI:  Body mass index is 22.68 kg/m.  Estimated Nutritional Needs:   Kcal:  2200-2400  Protein:  110-120 gm  Fluid:  1 L + UOP  Myrtie Cruise MS, RD, LDN Registered  Dietitian Clinical Nutrition RD Inpatient Contact Info in Amion

## 2023-06-12 NOTE — Procedures (Signed)
 I was present at this dialysis session. I have reviewed the session itself and made appropriate changes.   4K bath, UF goal 3L using AVF. Doing well. RS  Filed Weights   06/10/23 1803 06/12/23 0500 06/12/23 0816  Weight: 82.4 kg 82.4 kg 84 kg    Recent Labs  Lab 06/12/23 0830  NA 127*  K 3.4*  CL 92*  CO2 22  GLUCOSE 218*  BUN 43*  CREATININE 4.53*  CALCIUM 8.4*  PHOS 2.8    Recent Labs  Lab 06/10/23 1439  WBC 7.2  HGB 8.3*  HCT 25.5*  MCV 97.3  PLT 190    Scheduled Meds:  cholecalciferol  5,000 Units Oral Once per day on Monday Thursday   gabapentin  300 mg Oral QHS   heparin injection (subcutaneous)  5,000 Units Subcutaneous Q8H   lidocaine  1 Application Urethral Once   midodrine  10 mg Oral TID WC   midodrine  10 mg Oral Q M,W,F-HD   multivitamin  1 tablet Oral QHS   pantoprazole  40 mg Oral Daily   sertraline  50 mg Oral Daily   tamsulosin  0.4 mg Oral Daily   Continuous Infusions:  iron sucrose     PRN Meds:.acetaminophen, camphor-menthol, hydrOXYzine, lidocaine-prilocaine, loperamide, ondansetron, polyethylene glycol   Sabra Heck  MD 06/12/2023, 9:37 AM

## 2023-06-12 NOTE — Discharge Summary (Signed)
 Family Medicine Teaching [facility] Discharge Summary  Patient name: Louis Butler Medical record number: [id number] Date of birth: 1955/11/04 Age: 68 y.o. 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  Acute on Chronic Renal Failure  Electrolyte disturbance Patient was admitted to ICU and started on CRRT for renal failure, had great improvement of electrolyte disturbances when this was initiated.  Tolerated transition to intermittent HD well in hospital.  LUE AVF creation performed on 1/28.  CLIP process initiated and was continued on MWF schedule at [facility].  At time of discharge he tolerated HD well.   Hypotension Presented with hypotension requiring pressor support, was initially admitted to the ICU.  Pressures improved with CRRT, and he was off pressors by 1/26 when he transitioned to the  floor. Intermittently hypotensive after starting HD.  Was continued on midodrine 10 mg TID to improve pressures.  He continued to have hypotension which hindered his discharge to appropriate facility, so he was started on midodrine 10 mg 3 times daily, with 10 mg as needed prior to HD.  He continued to have profound orthostasis.  ACTH stim test was equivocal. Per Nephro, proceeded with fludrocortisone treatment, which produced mild improvement in patient's symptoms.  After discussion with the patient and his wife, he was discharged to [facility] upon bed becoming available approximately 2 weeks later.  Final regimen included midodrine 10 mg 3 times daily and 0.2 mg of fludrocortisone once daily.  Altered Mental Status Patient was encephalopathic on admission, suspected to be secondary to uremia and hyperkalemia, which resolved with initiation of dialysis.  At time of discharge patient was alert and oriented x4 and at baseline.  Bilateral Hydronephrosis obstructive uropathy  Pseudomonal UTI S/p Zosyn (1/24-1/27) with transition to Cefepime (1/27-1/30) in the hospital.  Per Urology recommendations, will keep Foley in place until outpatient follow-up.  Patient initially thought to require Foley exchange this hospitalization, but will keep Foley in place.  Anemia Hemoglobin remained around 7-8 while in the hospital.  Thought to be related to anemia of chronic disease.  Was given ESA. 1u pRBC given for hgb 6.7 on 1/29 likely from blood draws.  Other chronic conditions were medically managed with home medications and formulary alternatives as necessary (GERD).  Follow-up recommendations Patient will need urology follow up when discharged regarding Foley changes. Blood pressure medication held due to soft pressures.  Additionally, midodrine and fludrocortisone started for hypotension. Consider discontinuation of these and  addition of blood pressure medication if pressures are elevated. Please ensure follow-up  with [facility]. Stage II sacral ulcer, monitor and refer to wound care clinic if necessary. Please monitor CBGs outpatient and adjust insulin regimen as needed.  Disposition: LTAC  Discharge Condition: Stable  Discharge Exam:  Vitals:   06/12/23 1209 06/12/23 1703  BP: 114/62 (!) 133/57  Pulse: 73 73  Resp:  18  Temp:  97.9 F (36.6 C)  SpO2: 98% 100%   Physical Exam: ESRD: HD MWF Depressed mood: Continue Zoloft HTN: Holding home medications in setting of orthostasis GERD: Protonix 40 mg daily T2DM: A1c 6.8, will need CBG monitoring and adjustment to insulin regimen outpatient Cancer of skin/ears/external auditory canal: Stable, continue scheduled Tylenol Anemia: Stable, transfuse for hemoglobin less than 7, monitoring on HD days Neuropathy: Continue gabapentin 300 mg daily at bedtime  Significant Procedures: -Brief CRRT for admission -AVF creation 1/28 -1 unit PRBC 1/29   Significant Labs and Imaging:  No results for input(s): "WBC", "HGB", "HCT", "PLT" in the last 48 hours.  Recent Labs  Lab 06/12/23 0830  NA 127*  K 3.4*  CL 92*  CO2 22  GLUCOSE 218*  BUN 43*  CREATININE 4.53*  CALCIUM 8.4*  PHOS 2.8  ALBUMIN 3.0*    Results/Tests Pending at Time of Discharge: None  Discharge Medications:  Allergies as of 06/12/2023       Reactions   Chlorhexidine Gluconate Itching   Hydrocodone Hives, Itching   Percocet [oxycodone-acetaminophen] Hives, Itching        Medication List     STOP taking these medications    amLODipine 5 MG tablet Commonly known as: NORVASC   carvedilol 25 MG tablet Commonly known as: COREG   Lantus SoloStar 100 UNIT/ML Solostar Pen Generic drug: insulin glargine   sodium bicarbonate 650 MG tablet   traMADol 50 MG tablet Commonly known as: Ultram   trimethoprim 100 MG tablet Commonly known as: TRIMPEX   Veltassa 8.4 g packet Generic drug: patiromer       TAKE these medications    acetaminophen 500 MG  tablet Commonly known as: TYLENOL Take 1,000 mg by mouth every 6 (six) hours as needed for headache (pain).   blood glucose meter kit and supplies Kit Dispense based on patient and insurance preference. Use up to four times daily as directed. (FOR ICD-9 250.00, 250.01).   esomeprazole 40 MG capsule Commonly known as: NEXIUM Take 1 capsule (40 mg total) by mouth daily as needed (for heartburn). What changed: See the new instructions.   gabapentin 300 MG capsule Commonly known as: NEURONTIN Take 1 capsule (300 mg total) by mouth at bedtime.   Insulin Syringes (Disposable) U-100 0.5 ML Misc Use as per instructions 3 times daily AC.   iron sucrose Commonly known as: VENOFER Inject 110 mLs (200 mg total) into the vein every Monday, Wednesday, and Friday with hemodialysis.   loperamide 2 MG tablet Commonly known as: IMODIUM A-D Take 1 tablet (2 mg total) by mouth 4 (four) times daily as needed for diarrhea or loose stools.   midodrine 10 MG tablet Commonly known as: PROAMATINE Take 1 tablet (10 mg total) by mouth 3 (three) times daily with meals.   midodrine 10 MG tablet Commonly known as: PROAMATINE Take 1 tablet (10 mg total) by mouth every dialysis (give 15-30 minutes before dialysis treatments).   multivitamin Tabs tablet Take 1 tablet by mouth at bedtime.   mupirocin ointment 2 % Commonly known as: [employer]  Apply 1 Application topically 2 (two) times daily. Left ear canal   polyethylene glycol 17 g packet Commonly known as: MIRALAX / GLYCOLAX Take 17 g by mouth daily as needed for moderate constipation.   sertraline 50 MG tablet Commonly known as: ZOLOFT Take 1 tablet (50 mg total) by mouth daily.   tamsulosin 0.4 MG Caps capsule Commonly known as: FLOMAX Take 0.4 mg by mouth daily.   Vitamin D3 125 MCG (5000 UT) Caps Take 5,000 Units by mouth daily.               Durable Medical Equipment  (From admission, onward)           Start     Ordered    05/22/23 1622  For home use only DME 4 wheeled rolling walker with seat  Once       Question:  Patient needs a walker to treat with the following condition  Answer:  Gait instability   05/22/23 1621   05/04/23 1653  For home use only DME Hospital bed  Once       Comments: Patient requires frequent changes in body position ways not feasible with a regular bed.  Question Answer Comment  Length of Need Lifetime   The above medical condition requires: Patient requires the ability to reposition frequently   Head must be elevated greater than: 45 degrees   Bed type Semi-electric   Support Surface: Gel Overlay      05/04/23 1659   05/04/23 1642  For home use only DME Overbed table  Once        05/04/23 1641            Discharge Instructions: Please refer to Patient Instructions section of EMR for full details.  Patient was counseled important signs and symptoms that should prompt return to medical care, changes in medications, dietary instructions, activity restrictions, and follow up appointments.   Follow-Up Appointments: No future appointments.  Sharion Dove Kimbra Marcelino, MD 06/12/2023, 8:32 PM PGY-1, [facility] Family Medicine

## 2023-06-12 NOTE — Progress Notes (Signed)
 PT Cancellation Note  Patient Details Name: Louis Butler MRN: [id number] DOB: 1955/08/09   Cancelled Treatment:    Reason Eval/Treat Not Completed: Patient at procedure or test/unavailable. Pt at HD.   [name] [facility] 06/12/2023, 10:06 AM Skip Mayer PT Acute [employer] [phone]

## 2023-06-12 NOTE — Assessment & Plan Note (Signed)
 Mild, stable.

## 2023-06-12 NOTE — Assessment & Plan Note (Signed)
 ESRD: HD MWF Depressed mood: Continue Zoloft HTN: Holding home medications in setting of orthostasis GERD: Protonix 40 mg daily T2DM: A1c 6.8, will need CBG monitoring and adjustment to insulin regimen outpatient Cancer of skin/ears/external auditory canal: Stable, continue scheduled Tylenol Anemia: Stable, transfuse for hemoglobin less than 7, monitoring on HD days Neuropathy: Continue gabapentin 300 mg daily at bedtime

## 2023-06-12 NOTE — Progress Notes (Signed)
 Daily Progress Note Intern Pager: [phone]  Patient name: Louis Butler Medical record number: [id number] Date of birth: 04/02/1956 Age: 68 y.o. Gender: male  Primary Care Provider: Latrelle Dodrill, MD Consultants: Nephrology, CCM (signed off) Code Status: FULL  Pt Overview and Major Events to Date:  1/22: Admitted to ICU for CRRT 1/26: Stable from ICU, FMTS took over care 2/17: Insurance approved LTAC, awaiting bed availability 3/3: Urology not recommending Foley replacement   Assessment and Plan: Louis Butler is a 68 y.o. male with a pertinent PMH of ESRD on HD MWF who presented with acute renal failure and was briefly admitted to ICU for CRRT, currently medically stable for discharge to [facility].  Awaiting a pending discharge at [facility] for bed availability, follow up PM. Assessment & Plan ESRD (end stage renal disease) (HCC) On HD MWF which will continue outpatient at [facility], no complications  - Nephrology following, appreciate recommendations - RFP on dialysis days (MWF) Orthostatic hypotension No acute events overnight, mild dizziness with standing. - Midodrine 10 mg 3 times daily with additional 10 mg Monday Wednesday Friday during HD - Fludrocortisone 0.2 mg daily - Target MAP >60 - Abdominal binders, compression stockings Hydronephrosis History of hydronephrosis with obstruction.  Foley in place.  No inpatient intervention indicated per [facility]. -Outpatient Urology follow up and Foley changes Hypokalemia -Fludrocortisone as above Hyponatremia Mild, stable. Chronic health problem ESRD: HD MWF Depressed mood: Continue Zoloft HTN: Holding home medications in setting of orthostasis GERD: Protonix 40 mg daily T2DM: A1c 6.8, will need CBG monitoring and adjustment to insulin regimen outpatient Cancer of skin/ears/external auditory canal: Stable, continue scheduled Tylenol Anemia: Stable, transfuse for hemoglobin less than 7,  monitoring on HD days Neuropathy: Continue gabapentin 300 mg daily at bedtime  FEN/GI: Renal diet w/ fluid restriction PPx: Heparin Dispo: LTAC pending bed availability.  Subjective:  No new concerns, stable, unchanged.  Objective: Temp:  [97.6 F (36.4 C)-98.4 F (36.9 C)] 98.4 F (36.9 C) (03/07 0547) Pulse Rate:  [71-85] 85 (03/07 0736) Resp:  [18] 18 (03/07 0547) BP: (136-147)/(59-71) 142/71 (03/07 0736) SpO2:  [96 %-97 %] 97 % (03/07 0736) Weight:  [82.4 kg] 82.4 kg (03/07 0500)  Physical Exam: General: Age-appropriate, resting comfortably in bed during HD, NAD, alert and at baseline. Cardiovascular: Regular rate and rhythm. Normal S1/S2. No murmurs, rubs, or gallops appreciated. 2+ radial pulses. Pulmonary: Clear bilaterally to ascultation. No increased WOB, no accessory muscle usage on room air. No wheezes, crackles, or rhonchi. Abdominal: Normoactive bowel sounds, nondistended. No tenderness to deep or light palpation. Extremities: No peripheral edema bilaterally.  Capillary refill <2 seconds.  Laboratory: Most recent CBC Lab Results  Component Value Date   WBC 7.2 06/10/2023   HGB 8.3 (L) 06/10/2023   HCT 25.5 (L) 06/10/2023   MCV 97.3 06/10/2023   PLT 190 06/10/2023   Most recent BMP    Latest Ref Rng & Units 06/10/2023    2:40 PM  BMP  Glucose 70 - 99 mg/dL 130   BUN 8 - 23 mg/dL 46   Creatinine 8.65 - 1.24 mg/dL 7.84   Sodium 696 - 295 mmol/L 128   Potassium 3.5 - 5.1 mmol/L 3.3   Chloride 98 - 111 mmol/L 92   CO2 22 - 32 mmol/L 22   Calcium 8.9 - 10.3 mg/dL 8.5     Other pertinent labs: -None  New Imaging/Diagnostic Tests: -None  Louis Majerus, MD 06/12/2023, 8:33 AM  PGY-1,  [facility] Family Medicine FPTS Intern pager: [phone], text pages welcome Secure chat group [facility] Teaching Service

## 2023-06-12 NOTE — Assessment & Plan Note (Signed)
-  Fludrocortisone as above

## 2023-06-12 NOTE — Assessment & Plan Note (Signed)
 On HD MWF which will continue outpatient at [facility], no complications  - Nephrology following, appreciate recommendations - RFP on dialysis days (MWF)

## 2023-06-13 ENCOUNTER — Inpatient Hospital Stay
Admission: RE | Admit: 2023-06-13 | Discharge: 2023-07-10 | Disposition: A | Discharge status: Home Health | Attending: Urology | Admitting: Urology

## 2023-06-13 DIAGNOSIS — M6281 Muscle weakness (generalized): Secondary | ICD-10-CM | POA: Diagnosis not present

## 2023-06-13 DIAGNOSIS — Z992 Dependence on renal dialysis: Secondary | ICD-10-CM | POA: Diagnosis not present

## 2023-06-13 DIAGNOSIS — N2581 Secondary hyperparathyroidism of renal origin: Secondary | ICD-10-CM | POA: Diagnosis not present

## 2023-06-13 DIAGNOSIS — E118 Type 2 diabetes mellitus with unspecified complications: Secondary | ICD-10-CM | POA: Diagnosis not present

## 2023-06-13 DIAGNOSIS — E1140 Type 2 diabetes mellitus with diabetic neuropathy, unspecified: Secondary | ICD-10-CM | POA: Diagnosis not present

## 2023-06-13 DIAGNOSIS — E1122 Type 2 diabetes mellitus with diabetic chronic kidney disease: Secondary | ICD-10-CM | POA: Diagnosis not present

## 2023-06-13 DIAGNOSIS — I120 Hypertensive chronic kidney disease with stage 5 chronic kidney disease or end stage renal disease: Secondary | ICD-10-CM | POA: Diagnosis not present

## 2023-06-13 DIAGNOSIS — I951 Orthostatic hypotension: Secondary | ICD-10-CM | POA: Diagnosis not present

## 2023-06-13 DIAGNOSIS — N1330 Unspecified hydronephrosis: Secondary | ICD-10-CM | POA: Diagnosis not present

## 2023-06-13 DIAGNOSIS — C44201 Unspecified malignant neoplasm of skin of unspecified ear and external auricular canal: Secondary | ICD-10-CM | POA: Diagnosis not present

## 2023-06-13 DIAGNOSIS — I959 Hypotension, unspecified: Secondary | ICD-10-CM | POA: Diagnosis not present

## 2023-06-13 DIAGNOSIS — D631 Anemia in chronic kidney disease: Secondary | ICD-10-CM | POA: Diagnosis not present

## 2023-06-13 DIAGNOSIS — K219 Gastro-esophageal reflux disease without esophagitis: Secondary | ICD-10-CM | POA: Diagnosis not present

## 2023-06-13 DIAGNOSIS — G9340 Encephalopathy, unspecified: Secondary | ICD-10-CM | POA: Diagnosis not present

## 2023-06-13 DIAGNOSIS — N186 End stage renal disease: Secondary | ICD-10-CM | POA: Diagnosis not present

## 2023-06-13 DIAGNOSIS — D638 Anemia in other chronic diseases classified elsewhere: Secondary | ICD-10-CM | POA: Diagnosis not present

## 2023-06-13 DIAGNOSIS — Z794 Long term (current) use of insulin: Secondary | ICD-10-CM | POA: Diagnosis not present

## 2023-06-13 DIAGNOSIS — N1831 Chronic kidney disease, stage 3a: Secondary | ICD-10-CM | POA: Diagnosis not present

## 2023-06-13 LAB — PHOSPHORUS: Phosphorus: 3.1 mg/dL (ref 2.5–4.6)

## 2023-06-13 LAB — COMPREHENSIVE METABOLIC PANEL
ALT: 16 U/L (ref 0–44)
AST: 19 U/L (ref 15–41)
Albumin: 3 g/dL — ABNORMAL LOW (ref 3.5–5.0)
Alkaline Phosphatase: 58 U/L (ref 38–126)
Anion gap: 11 (ref 5–15)
BUN: 34 mg/dL — ABNORMAL HIGH (ref 8–23)
CO2: 25 mmol/L (ref 22–32)
Calcium: 8.5 mg/dL — ABNORMAL LOW (ref 8.9–10.3)
Chloride: 94 mmol/L — ABNORMAL LOW (ref 98–111)
Creatinine, Ser: 3.81 mg/dL — ABNORMAL HIGH (ref 0.61–1.24)
GFR, Estimated: 17 mL/min — ABNORMAL LOW (ref >60)
Glucose, Bld: 104 mg/dL — ABNORMAL HIGH (ref 70–99)
Potassium: 3.5 mmol/L (ref 3.5–5.1)
Sodium: 130 mmol/L — ABNORMAL LOW (ref 135–145)
Total Bilirubin: 0.7 mg/dL (ref 0.0–1.2)
Total Protein: 7 g/dL (ref 6.5–8.1)

## 2023-06-13 LAB — CBC WITH DIFFERENTIAL/PLATELET
Abs Immature Granulocytes: 0.04 10*3/uL (ref 0.00–0.07)
Basophils Absolute: 0 10*3/uL (ref 0.0–0.1)
Basophils Relative: 1 %
Eosinophils Absolute: 0.4 10*3/uL (ref 0.0–0.5)
Eosinophils Relative: 5 %
HCT: 27.1 % — ABNORMAL LOW (ref 39.0–52.0)
Hemoglobin: 8.9 g/dL — ABNORMAL LOW (ref 13.0–17.0)
Immature Granulocytes: 1 %
Lymphocytes Relative: 21 %
Lymphs Abs: 1.6 10*3/uL (ref 0.7–4.0)
MCH: 32.1 pg (ref 26.0–34.0)
MCHC: 32.8 g/dL (ref 30.0–36.0)
MCV: 97.8 fL (ref 80.0–100.0)
Monocytes Absolute: 0.8 10*3/uL (ref 0.1–1.0)
Monocytes Relative: 10 %
Neutro Abs: 5 10*3/uL (ref 1.7–7.7)
Neutrophils Relative %: 62 %
Platelets: 190 10*3/uL (ref 150–400)
RBC: 2.77 MIL/uL — ABNORMAL LOW (ref 4.22–5.81)
RDW: 15.9 % — ABNORMAL HIGH (ref 11.5–15.5)
WBC: 7.9 10*3/uL (ref 4.0–10.5)
nRBC: 0 % (ref 0.0–0.2)

## 2023-06-13 LAB — PROTIME-INR
INR: 1 (ref 0.8–1.2)
Prothrombin Time: 13.5 s (ref 11.4–15.2)

## 2023-06-14 DIAGNOSIS — D631 Anemia in chronic kidney disease: Secondary | ICD-10-CM | POA: Diagnosis not present

## 2023-06-14 DIAGNOSIS — E118 Type 2 diabetes mellitus with unspecified complications: Secondary | ICD-10-CM | POA: Diagnosis not present

## 2023-06-14 DIAGNOSIS — M6281 Muscle weakness (generalized): Secondary | ICD-10-CM | POA: Diagnosis not present

## 2023-06-14 DIAGNOSIS — D638 Anemia in other chronic diseases classified elsewhere: Secondary | ICD-10-CM | POA: Diagnosis not present

## 2023-06-14 DIAGNOSIS — I959 Hypotension, unspecified: Secondary | ICD-10-CM | POA: Diagnosis not present

## 2023-06-14 DIAGNOSIS — N186 End stage renal disease: Secondary | ICD-10-CM | POA: Diagnosis not present

## 2023-06-14 DIAGNOSIS — N2581 Secondary hyperparathyroidism of renal origin: Secondary | ICD-10-CM | POA: Diagnosis not present

## 2023-06-15 DIAGNOSIS — E118 Type 2 diabetes mellitus with unspecified complications: Secondary | ICD-10-CM | POA: Diagnosis not present

## 2023-06-15 DIAGNOSIS — M6281 Muscle weakness (generalized): Secondary | ICD-10-CM | POA: Diagnosis not present

## 2023-06-15 DIAGNOSIS — D638 Anemia in other chronic diseases classified elsewhere: Secondary | ICD-10-CM | POA: Diagnosis not present

## 2023-06-15 LAB — CBC
HCT: 27.9 % — ABNORMAL LOW (ref 39.0–52.0)
Hemoglobin: 9.3 g/dL — ABNORMAL LOW (ref 13.0–17.0)
MCH: 32.1 pg (ref 26.0–34.0)
MCHC: 33.3 g/dL (ref 30.0–36.0)
MCV: 96.2 fL (ref 80.0–100.0)
Platelets: 202 10*3/uL (ref 150–400)
RBC: 2.9 MIL/uL — ABNORMAL LOW (ref 4.22–5.81)
RDW: 15.6 % — ABNORMAL HIGH (ref 11.5–15.5)
WBC: 6.6 10*3/uL (ref 4.0–10.5)
nRBC: 0 % (ref 0.0–0.2)

## 2023-06-15 LAB — COMPREHENSIVE METABOLIC PANEL
ALT: 18 U/L (ref 0–44)
AST: 18 U/L (ref 15–41)
Albumin: 3 g/dL — ABNORMAL LOW (ref 3.5–5.0)
Alkaline Phosphatase: 74 U/L (ref 38–126)
Anion gap: 11 (ref 5–15)
BUN: 54 mg/dL — ABNORMAL HIGH (ref 8–23)
CO2: 25 mmol/L (ref 22–32)
Calcium: 8.6 mg/dL — ABNORMAL LOW (ref 8.9–10.3)
Chloride: 93 mmol/L — ABNORMAL LOW (ref 98–111)
Creatinine, Ser: 5.24 mg/dL — ABNORMAL HIGH (ref 0.61–1.24)
GFR, Estimated: 11 mL/min — ABNORMAL LOW (ref >60)
Glucose, Bld: 153 mg/dL — ABNORMAL HIGH (ref 70–99)
Potassium: 3.5 mmol/L (ref 3.5–5.1)
Sodium: 129 mmol/L — ABNORMAL LOW (ref 135–145)
Total Bilirubin: 0.6 mg/dL (ref 0.0–1.2)
Total Protein: 7 g/dL (ref 6.5–8.1)

## 2023-06-15 NOTE — Progress Notes (Signed)
 Late Note Entry- June 15, 2023  Pt was D/C to [facility] late Friday. Contacted [facility] admissions and local HD clinic this morning to be advised of pt's d/c. [name] referral will have to be canceled to [facility] due to pt's d/c date being unknown from [facility]. Referral will need to make again once pt stable and d/c date known. Spoke to pt's wife via phone to make her aware of the above info.   Olivia Canter Renal Navigator [phone]

## 2023-06-16 DIAGNOSIS — M6281 Muscle weakness (generalized): Secondary | ICD-10-CM | POA: Diagnosis not present

## 2023-06-16 DIAGNOSIS — E118 Type 2 diabetes mellitus with unspecified complications: Secondary | ICD-10-CM | POA: Diagnosis not present

## 2023-06-16 DIAGNOSIS — N186 End stage renal disease: Secondary | ICD-10-CM | POA: Diagnosis not present

## 2023-06-16 DIAGNOSIS — D638 Anemia in other chronic diseases classified elsewhere: Secondary | ICD-10-CM | POA: Diagnosis not present

## 2023-06-17 DIAGNOSIS — N2581 Secondary hyperparathyroidism of renal origin: Secondary | ICD-10-CM | POA: Diagnosis not present

## 2023-06-17 DIAGNOSIS — D638 Anemia in other chronic diseases classified elsewhere: Secondary | ICD-10-CM | POA: Diagnosis not present

## 2023-06-17 DIAGNOSIS — N186 End stage renal disease: Secondary | ICD-10-CM | POA: Diagnosis not present

## 2023-06-17 DIAGNOSIS — D631 Anemia in chronic kidney disease: Secondary | ICD-10-CM | POA: Diagnosis not present

## 2023-06-17 DIAGNOSIS — I959 Hypotension, unspecified: Secondary | ICD-10-CM | POA: Diagnosis not present

## 2023-06-17 DIAGNOSIS — M6281 Muscle weakness (generalized): Secondary | ICD-10-CM | POA: Diagnosis not present

## 2023-06-17 DIAGNOSIS — E118 Type 2 diabetes mellitus with unspecified complications: Secondary | ICD-10-CM | POA: Diagnosis not present

## 2023-06-17 LAB — CBC WITH DIFFERENTIAL/PLATELET
Abs Immature Granulocytes: 0.06 10*3/uL (ref 0.00–0.07)
Basophils Absolute: 0 10*3/uL (ref 0.0–0.1)
Basophils Relative: 1 %
Eosinophils Absolute: 0.5 10*3/uL (ref 0.0–0.5)
Eosinophils Relative: 7 %
HCT: 26.8 % — ABNORMAL LOW (ref 39.0–52.0)
Hemoglobin: 8.9 g/dL — ABNORMAL LOW (ref 13.0–17.0)
Immature Granulocytes: 1 %
Lymphocytes Relative: 26 %
Lymphs Abs: 1.8 10*3/uL (ref 0.7–4.0)
MCH: 32.5 pg (ref 26.0–34.0)
MCHC: 33.2 g/dL (ref 30.0–36.0)
MCV: 97.8 fL (ref 80.0–100.0)
Monocytes Absolute: 0.9 10*3/uL (ref 0.1–1.0)
Monocytes Relative: 13 %
Neutro Abs: 3.5 10*3/uL (ref 1.7–7.7)
Neutrophils Relative %: 52 %
Platelets: 179 10*3/uL (ref 150–400)
RBC: 2.74 MIL/uL — ABNORMAL LOW (ref 4.22–5.81)
RDW: 16.1 % — ABNORMAL HIGH (ref 11.5–15.5)
WBC: 6.7 10*3/uL (ref 4.0–10.5)
nRBC: 0 % (ref 0.0–0.2)

## 2023-06-17 LAB — RENAL FUNCTION PANEL
Albumin: 2.9 g/dL — ABNORMAL LOW (ref 3.5–5.0)
Anion gap: 8 (ref 5–15)
BUN: 37 mg/dL — ABNORMAL HIGH (ref 8–23)
CO2: 27 mmol/L (ref 22–32)
Calcium: 8.6 mg/dL — ABNORMAL LOW (ref 8.9–10.3)
Chloride: 98 mmol/L (ref 98–111)
Creatinine, Ser: 4.66 mg/dL — ABNORMAL HIGH (ref 0.61–1.24)
GFR, Estimated: 13 mL/min — ABNORMAL LOW (ref >60)
Glucose, Bld: 129 mg/dL — ABNORMAL HIGH (ref 70–99)
Phosphorus: 3.5 mg/dL (ref 2.5–4.6)
Potassium: 4.2 mmol/L (ref 3.5–5.1)
Sodium: 133 mmol/L — ABNORMAL LOW (ref 135–145)

## 2023-06-18 DIAGNOSIS — D638 Anemia in other chronic diseases classified elsewhere: Secondary | ICD-10-CM | POA: Diagnosis not present

## 2023-06-18 DIAGNOSIS — E118 Type 2 diabetes mellitus with unspecified complications: Secondary | ICD-10-CM | POA: Diagnosis not present

## 2023-06-18 DIAGNOSIS — N186 End stage renal disease: Secondary | ICD-10-CM | POA: Diagnosis not present

## 2023-06-18 DIAGNOSIS — M6281 Muscle weakness (generalized): Secondary | ICD-10-CM | POA: Diagnosis not present

## 2023-06-19 DIAGNOSIS — D631 Anemia in chronic kidney disease: Secondary | ICD-10-CM | POA: Diagnosis not present

## 2023-06-19 DIAGNOSIS — N2581 Secondary hyperparathyroidism of renal origin: Secondary | ICD-10-CM | POA: Diagnosis not present

## 2023-06-19 DIAGNOSIS — D638 Anemia in other chronic diseases classified elsewhere: Secondary | ICD-10-CM | POA: Diagnosis not present

## 2023-06-19 DIAGNOSIS — I959 Hypotension, unspecified: Secondary | ICD-10-CM | POA: Diagnosis not present

## 2023-06-19 DIAGNOSIS — E118 Type 2 diabetes mellitus with unspecified complications: Secondary | ICD-10-CM | POA: Diagnosis not present

## 2023-06-19 DIAGNOSIS — N186 End stage renal disease: Secondary | ICD-10-CM | POA: Diagnosis not present

## 2023-06-19 DIAGNOSIS — M6281 Muscle weakness (generalized): Secondary | ICD-10-CM | POA: Diagnosis not present

## 2023-06-19 LAB — CBC WITH DIFFERENTIAL/PLATELET
Abs Immature Granulocytes: 0.05 10*3/uL (ref 0.00–0.07)
Basophils Absolute: 0 10*3/uL (ref 0.0–0.1)
Basophils Relative: 0 %
Eosinophils Absolute: 0.5 10*3/uL (ref 0.0–0.5)
Eosinophils Relative: 7 %
HCT: 27.7 % — ABNORMAL LOW (ref 39.0–52.0)
Hemoglobin: 9.1 g/dL — ABNORMAL LOW (ref 13.0–17.0)
Immature Granulocytes: 1 %
Lymphocytes Relative: 22 %
Lymphs Abs: 1.8 10*3/uL (ref 0.7–4.0)
MCH: 32.3 pg (ref 26.0–34.0)
MCHC: 32.9 g/dL (ref 30.0–36.0)
MCV: 98.2 fL (ref 80.0–100.0)
Monocytes Absolute: 0.9 10*3/uL (ref 0.1–1.0)
Monocytes Relative: 11 %
Neutro Abs: 4.6 10*3/uL (ref 1.7–7.7)
Neutrophils Relative %: 59 %
Platelets: 182 10*3/uL (ref 150–400)
RBC: 2.82 MIL/uL — ABNORMAL LOW (ref 4.22–5.81)
RDW: 15.7 % — ABNORMAL HIGH (ref 11.5–15.5)
WBC: 7.8 10*3/uL (ref 4.0–10.5)
nRBC: 0 % (ref 0.0–0.2)

## 2023-06-19 LAB — RENAL FUNCTION PANEL
Albumin: 2.7 g/dL — ABNORMAL LOW (ref 3.5–5.0)
Anion gap: 11 (ref 5–15)
BUN: 34 mg/dL — ABNORMAL HIGH (ref 8–23)
CO2: 26 mmol/L (ref 22–32)
Calcium: 8.5 mg/dL — ABNORMAL LOW (ref 8.9–10.3)
Chloride: 96 mmol/L — ABNORMAL LOW (ref 98–111)
Creatinine, Ser: 4.52 mg/dL — ABNORMAL HIGH (ref 0.61–1.24)
GFR, Estimated: 13 mL/min — ABNORMAL LOW (ref >60)
Glucose, Bld: 102 mg/dL — ABNORMAL HIGH (ref 70–99)
Phosphorus: 3.1 mg/dL (ref 2.5–4.6)
Potassium: 3.9 mmol/L (ref 3.5–5.1)
Sodium: 133 mmol/L — ABNORMAL LOW (ref 135–145)

## 2023-06-20 DIAGNOSIS — M6281 Muscle weakness (generalized): Secondary | ICD-10-CM | POA: Diagnosis not present

## 2023-06-20 DIAGNOSIS — D638 Anemia in other chronic diseases classified elsewhere: Secondary | ICD-10-CM | POA: Diagnosis not present

## 2023-06-20 DIAGNOSIS — E118 Type 2 diabetes mellitus with unspecified complications: Secondary | ICD-10-CM | POA: Diagnosis not present

## 2023-06-20 DIAGNOSIS — N186 End stage renal disease: Secondary | ICD-10-CM | POA: Diagnosis not present

## 2023-06-20 LAB — HEPATITIS B SURFACE ANTIBODY,QUALITATIVE: Hep B S Ab: REACTIVE — AB

## 2023-06-20 LAB — HEPATITIS B SURFACE ANTIGEN: Hepatitis B Surface Ag: NONREACTIVE

## 2023-06-21 DIAGNOSIS — N186 End stage renal disease: Secondary | ICD-10-CM | POA: Diagnosis not present

## 2023-06-21 DIAGNOSIS — D638 Anemia in other chronic diseases classified elsewhere: Secondary | ICD-10-CM | POA: Diagnosis not present

## 2023-06-21 DIAGNOSIS — M6281 Muscle weakness (generalized): Secondary | ICD-10-CM | POA: Diagnosis not present

## 2023-06-21 DIAGNOSIS — E118 Type 2 diabetes mellitus with unspecified complications: Secondary | ICD-10-CM | POA: Diagnosis not present

## 2023-06-22 DIAGNOSIS — N186 End stage renal disease: Secondary | ICD-10-CM | POA: Diagnosis not present

## 2023-06-22 DIAGNOSIS — I959 Hypotension, unspecified: Secondary | ICD-10-CM | POA: Diagnosis not present

## 2023-06-22 DIAGNOSIS — E118 Type 2 diabetes mellitus with unspecified complications: Secondary | ICD-10-CM | POA: Diagnosis not present

## 2023-06-22 DIAGNOSIS — D631 Anemia in chronic kidney disease: Secondary | ICD-10-CM | POA: Diagnosis not present

## 2023-06-22 DIAGNOSIS — M6281 Muscle weakness (generalized): Secondary | ICD-10-CM | POA: Diagnosis not present

## 2023-06-22 DIAGNOSIS — N2581 Secondary hyperparathyroidism of renal origin: Secondary | ICD-10-CM | POA: Diagnosis not present

## 2023-06-22 DIAGNOSIS — D638 Anemia in other chronic diseases classified elsewhere: Secondary | ICD-10-CM | POA: Diagnosis not present

## 2023-06-22 LAB — RENAL FUNCTION PANEL
Albumin: 2.6 g/dL — ABNORMAL LOW (ref 3.5–5.0)
Anion gap: 10 (ref 5–15)
BUN: 42 mg/dL — ABNORMAL HIGH (ref 8–23)
CO2: 24 mmol/L (ref 22–32)
Calcium: 8.3 mg/dL — ABNORMAL LOW (ref 8.9–10.3)
Chloride: 97 mmol/L — ABNORMAL LOW (ref 98–111)
Creatinine, Ser: 5.09 mg/dL — ABNORMAL HIGH (ref 0.61–1.24)
GFR, Estimated: 12 mL/min — ABNORMAL LOW (ref >60)
Glucose, Bld: 124 mg/dL — ABNORMAL HIGH (ref 70–99)
Phosphorus: 3.4 mg/dL (ref 2.5–4.6)
Potassium: 3.5 mmol/L (ref 3.5–5.1)
Sodium: 131 mmol/L — ABNORMAL LOW (ref 135–145)

## 2023-06-22 LAB — CBC WITH DIFFERENTIAL/PLATELET
Abs Immature Granulocytes: 0.03 10*3/uL (ref 0.00–0.07)
Basophils Absolute: 0 10*3/uL (ref 0.0–0.1)
Basophils Relative: 0 %
Eosinophils Absolute: 0.5 10*3/uL (ref 0.0–0.5)
Eosinophils Relative: 8 %
HCT: 25.9 % — ABNORMAL LOW (ref 39.0–52.0)
Hemoglobin: 8.8 g/dL — ABNORMAL LOW (ref 13.0–17.0)
Immature Granulocytes: 1 %
Lymphocytes Relative: 21 %
Lymphs Abs: 1.3 10*3/uL (ref 0.7–4.0)
MCH: 32.8 pg (ref 26.0–34.0)
MCHC: 34 g/dL (ref 30.0–36.0)
MCV: 96.6 fL (ref 80.0–100.0)
Monocytes Absolute: 0.7 10*3/uL (ref 0.1–1.0)
Monocytes Relative: 11 %
Neutro Abs: 3.7 10*3/uL (ref 1.7–7.7)
Neutrophils Relative %: 59 %
Platelets: 138 10*3/uL — ABNORMAL LOW (ref 150–400)
RBC: 2.68 MIL/uL — ABNORMAL LOW (ref 4.22–5.81)
RDW: 15.3 % (ref 11.5–15.5)
WBC: 6.2 10*3/uL (ref 4.0–10.5)
nRBC: 0 % (ref 0.0–0.2)

## 2023-06-23 DIAGNOSIS — M6281 Muscle weakness (generalized): Secondary | ICD-10-CM | POA: Diagnosis not present

## 2023-06-23 DIAGNOSIS — E118 Type 2 diabetes mellitus with unspecified complications: Secondary | ICD-10-CM | POA: Diagnosis not present

## 2023-06-23 DIAGNOSIS — D638 Anemia in other chronic diseases classified elsewhere: Secondary | ICD-10-CM | POA: Diagnosis not present

## 2023-06-24 DIAGNOSIS — N186 End stage renal disease: Secondary | ICD-10-CM | POA: Diagnosis not present

## 2023-06-24 DIAGNOSIS — N1831 Chronic kidney disease, stage 3a: Secondary | ICD-10-CM | POA: Diagnosis not present

## 2023-06-24 DIAGNOSIS — D631 Anemia in chronic kidney disease: Secondary | ICD-10-CM | POA: Diagnosis not present

## 2023-06-24 DIAGNOSIS — I959 Hypotension, unspecified: Secondary | ICD-10-CM | POA: Diagnosis not present

## 2023-06-24 LAB — RENAL FUNCTION PANEL
Albumin: 2.7 g/dL — ABNORMAL LOW (ref 3.5–5.0)
Anion gap: 8 (ref 5–15)
BUN: 44 mg/dL — ABNORMAL HIGH (ref 8–23)
CO2: 26 mmol/L (ref 22–32)
Calcium: 8.6 mg/dL — ABNORMAL LOW (ref 8.9–10.3)
Chloride: 97 mmol/L — ABNORMAL LOW (ref 98–111)
Creatinine, Ser: 4.94 mg/dL — ABNORMAL HIGH (ref 0.61–1.24)
GFR, Estimated: 12 mL/min — ABNORMAL LOW (ref >60)
Glucose, Bld: 115 mg/dL — ABNORMAL HIGH (ref 70–99)
Phosphorus: 3.5 mg/dL (ref 2.5–4.6)
Potassium: 3.9 mmol/L (ref 3.5–5.1)
Sodium: 131 mmol/L — ABNORMAL LOW (ref 135–145)

## 2023-06-24 LAB — CBC WITH DIFFERENTIAL/PLATELET
Abs Immature Granulocytes: 0.03 10*3/uL (ref 0.00–0.07)
Basophils Absolute: 0 10*3/uL (ref 0.0–0.1)
Basophils Relative: 1 %
Eosinophils Absolute: 0.5 10*3/uL (ref 0.0–0.5)
Eosinophils Relative: 8 %
HCT: 27.1 % — ABNORMAL LOW (ref 39.0–52.0)
Hemoglobin: 9.2 g/dL — ABNORMAL LOW (ref 13.0–17.0)
Immature Granulocytes: 1 %
Lymphocytes Relative: 21 %
Lymphs Abs: 1.3 10*3/uL (ref 0.7–4.0)
MCH: 32.9 pg (ref 26.0–34.0)
MCHC: 33.9 g/dL (ref 30.0–36.0)
MCV: 96.8 fL (ref 80.0–100.0)
Monocytes Absolute: 0.7 10*3/uL (ref 0.1–1.0)
Monocytes Relative: 12 %
Neutro Abs: 3.6 10*3/uL (ref 1.7–7.7)
Neutrophils Relative %: 57 %
Platelets: 138 10*3/uL — ABNORMAL LOW (ref 150–400)
RBC: 2.8 MIL/uL — ABNORMAL LOW (ref 4.22–5.81)
RDW: 15.5 % (ref 11.5–15.5)
WBC: 6.1 10*3/uL (ref 4.0–10.5)
nRBC: 0 % (ref 0.0–0.2)

## 2023-06-25 LAB — HEPATITIS B SURFACE ANTIGEN: Hepatitis B Surface Ag: NONREACTIVE

## 2023-06-26 DIAGNOSIS — D631 Anemia in chronic kidney disease: Secondary | ICD-10-CM | POA: Diagnosis not present

## 2023-06-26 DIAGNOSIS — N2581 Secondary hyperparathyroidism of renal origin: Secondary | ICD-10-CM | POA: Diagnosis not present

## 2023-06-26 DIAGNOSIS — N186 End stage renal disease: Secondary | ICD-10-CM | POA: Diagnosis not present

## 2023-06-26 DIAGNOSIS — I959 Hypotension, unspecified: Secondary | ICD-10-CM | POA: Diagnosis not present

## 2023-06-26 LAB — RENAL FUNCTION PANEL
Albumin: 2.9 g/dL — ABNORMAL LOW (ref 3.5–5.0)
Anion gap: 7 (ref 5–15)
BUN: 39 mg/dL — ABNORMAL HIGH (ref 8–23)
CO2: 26 mmol/L (ref 22–32)
Calcium: 8.7 mg/dL — ABNORMAL LOW (ref 8.9–10.3)
Chloride: 97 mmol/L — ABNORMAL LOW (ref 98–111)
Creatinine, Ser: 5.08 mg/dL — ABNORMAL HIGH (ref 0.61–1.24)
GFR, Estimated: 12 mL/min — ABNORMAL LOW (ref >60)
Glucose, Bld: 101 mg/dL — ABNORMAL HIGH (ref 70–99)
Phosphorus: 4 mg/dL (ref 2.5–4.6)
Potassium: 4.7 mmol/L (ref 3.5–5.1)
Sodium: 130 mmol/L — ABNORMAL LOW (ref 135–145)

## 2023-06-26 LAB — CBC WITH DIFFERENTIAL/PLATELET
Abs Immature Granulocytes: 0.01 10*3/uL (ref 0.00–0.07)
Basophils Absolute: 0 10*3/uL (ref 0.0–0.1)
Basophils Relative: 1 %
Eosinophils Absolute: 0.5 10*3/uL (ref 0.0–0.5)
Eosinophils Relative: 9 %
HCT: 26.9 % — ABNORMAL LOW (ref 39.0–52.0)
Hemoglobin: 9 g/dL — ABNORMAL LOW (ref 13.0–17.0)
Immature Granulocytes: 0 %
Lymphocytes Relative: 23 %
Lymphs Abs: 1.3 10*3/uL (ref 0.7–4.0)
MCH: 31.9 pg (ref 26.0–34.0)
MCHC: 33.5 g/dL (ref 30.0–36.0)
MCV: 95.4 fL (ref 80.0–100.0)
Monocytes Absolute: 0.7 10*3/uL (ref 0.1–1.0)
Monocytes Relative: 13 %
Neutro Abs: 3 10*3/uL (ref 1.7–7.7)
Neutrophils Relative %: 54 %
Platelets: 120 10*3/uL — ABNORMAL LOW (ref 150–400)
RBC: 2.82 MIL/uL — ABNORMAL LOW (ref 4.22–5.81)
RDW: 15.1 % (ref 11.5–15.5)
WBC: 5.5 10*3/uL (ref 4.0–10.5)
nRBC: 0 % (ref 0.0–0.2)

## 2023-06-28 DIAGNOSIS — D631 Anemia in chronic kidney disease: Secondary | ICD-10-CM | POA: Diagnosis not present

## 2023-06-28 DIAGNOSIS — N186 End stage renal disease: Secondary | ICD-10-CM | POA: Diagnosis not present

## 2023-06-28 DIAGNOSIS — I959 Hypotension, unspecified: Secondary | ICD-10-CM | POA: Diagnosis not present

## 2023-06-28 DIAGNOSIS — N2581 Secondary hyperparathyroidism of renal origin: Secondary | ICD-10-CM | POA: Diagnosis not present

## 2023-06-29 LAB — CBC WITH DIFFERENTIAL/PLATELET
Abs Immature Granulocytes: 0.02 10*3/uL (ref 0.00–0.07)
Basophils Absolute: 0 10*3/uL (ref 0.0–0.1)
Basophils Relative: 0 %
Eosinophils Absolute: 0.5 10*3/uL (ref 0.0–0.5)
Eosinophils Relative: 9 %
HCT: 24.8 % — ABNORMAL LOW (ref 39.0–52.0)
Hemoglobin: 8.5 g/dL — ABNORMAL LOW (ref 13.0–17.0)
Immature Granulocytes: 0 %
Lymphocytes Relative: 21 %
Lymphs Abs: 1.1 10*3/uL (ref 0.7–4.0)
MCH: 32.9 pg (ref 26.0–34.0)
MCHC: 34.3 g/dL (ref 30.0–36.0)
MCV: 96.1 fL (ref 80.0–100.0)
Monocytes Absolute: 0.7 10*3/uL (ref 0.1–1.0)
Monocytes Relative: 13 %
Neutro Abs: 3 10*3/uL (ref 1.7–7.7)
Neutrophils Relative %: 57 %
Platelets: 140 10*3/uL — ABNORMAL LOW (ref 150–400)
RBC: 2.58 MIL/uL — ABNORMAL LOW (ref 4.22–5.81)
RDW: 15.1 % (ref 11.5–15.5)
WBC: 5.2 10*3/uL (ref 4.0–10.5)
nRBC: 0 % (ref 0.0–0.2)

## 2023-06-29 LAB — RENAL FUNCTION PANEL
Albumin: 2.7 g/dL — ABNORMAL LOW (ref 3.5–5.0)
Anion gap: 8 (ref 5–15)
BUN: 55 mg/dL — ABNORMAL HIGH (ref 8–23)
CO2: 25 mmol/L (ref 22–32)
Calcium: 8.6 mg/dL — ABNORMAL LOW (ref 8.9–10.3)
Chloride: 99 mmol/L (ref 98–111)
Creatinine, Ser: 5.41 mg/dL — ABNORMAL HIGH (ref 0.61–1.24)
GFR, Estimated: 11 mL/min — ABNORMAL LOW (ref >60)
Glucose, Bld: 112 mg/dL — ABNORMAL HIGH (ref 70–99)
Phosphorus: 4.2 mg/dL (ref 2.5–4.6)
Potassium: 3.8 mmol/L (ref 3.5–5.1)
Sodium: 132 mmol/L — ABNORMAL LOW (ref 135–145)

## 2023-07-01 DIAGNOSIS — M6281 Muscle weakness (generalized): Secondary | ICD-10-CM | POA: Diagnosis not present

## 2023-07-01 DIAGNOSIS — N186 End stage renal disease: Secondary | ICD-10-CM | POA: Diagnosis not present

## 2023-07-01 DIAGNOSIS — D631 Anemia in chronic kidney disease: Secondary | ICD-10-CM | POA: Diagnosis not present

## 2023-07-01 DIAGNOSIS — E118 Type 2 diabetes mellitus with unspecified complications: Secondary | ICD-10-CM | POA: Diagnosis not present

## 2023-07-01 DIAGNOSIS — I959 Hypotension, unspecified: Secondary | ICD-10-CM | POA: Diagnosis not present

## 2023-07-01 DIAGNOSIS — N2581 Secondary hyperparathyroidism of renal origin: Secondary | ICD-10-CM | POA: Diagnosis not present

## 2023-07-01 DIAGNOSIS — D638 Anemia in other chronic diseases classified elsewhere: Secondary | ICD-10-CM | POA: Diagnosis not present

## 2023-07-01 LAB — CBC WITH DIFFERENTIAL/PLATELET
Abs Immature Granulocytes: 0.02 10*3/uL (ref 0.00–0.07)
Basophils Absolute: 0 10*3/uL (ref 0.0–0.1)
Basophils Relative: 0 %
Eosinophils Absolute: 0.4 10*3/uL (ref 0.0–0.5)
Eosinophils Relative: 8 %
HCT: 24.5 % — ABNORMAL LOW (ref 39.0–52.0)
Hemoglobin: 8.4 g/dL — ABNORMAL LOW (ref 13.0–17.0)
Immature Granulocytes: 0 %
Lymphocytes Relative: 23 %
Lymphs Abs: 1.1 10*3/uL (ref 0.7–4.0)
MCH: 32.6 pg (ref 26.0–34.0)
MCHC: 34.3 g/dL (ref 30.0–36.0)
MCV: 95 fL (ref 80.0–100.0)
Monocytes Absolute: 0.7 10*3/uL (ref 0.1–1.0)
Monocytes Relative: 13 %
Neutro Abs: 2.8 10*3/uL (ref 1.7–7.7)
Neutrophils Relative %: 56 %
Platelets: 142 10*3/uL — ABNORMAL LOW (ref 150–400)
RBC: 2.58 MIL/uL — ABNORMAL LOW (ref 4.22–5.81)
RDW: 15.1 % (ref 11.5–15.5)
WBC: 5 10*3/uL (ref 4.0–10.5)
nRBC: 0 % (ref 0.0–0.2)

## 2023-07-01 LAB — HEPATITIS B CORE ANTIBODY, IGM: Hep B C IgM: NONREACTIVE

## 2023-07-01 LAB — RENAL FUNCTION PANEL
Albumin: 2.6 g/dL — ABNORMAL LOW (ref 3.5–5.0)
Anion gap: 11 (ref 5–15)
BUN: 47 mg/dL — ABNORMAL HIGH (ref 8–23)
CO2: 25 mmol/L (ref 22–32)
Calcium: 8.9 mg/dL (ref 8.9–10.3)
Chloride: 98 mmol/L (ref 98–111)
Creatinine, Ser: 4.44 mg/dL — ABNORMAL HIGH (ref 0.61–1.24)
GFR, Estimated: 14 mL/min — ABNORMAL LOW (ref >60)
Glucose, Bld: 123 mg/dL — ABNORMAL HIGH (ref 70–99)
Phosphorus: 3.8 mg/dL (ref 2.5–4.6)
Potassium: 3.7 mmol/L (ref 3.5–5.1)
Sodium: 134 mmol/L — ABNORMAL LOW (ref 135–145)

## 2023-07-02 DIAGNOSIS — N186 End stage renal disease: Secondary | ICD-10-CM | POA: Diagnosis not present

## 2023-07-02 DIAGNOSIS — D638 Anemia in other chronic diseases classified elsewhere: Secondary | ICD-10-CM | POA: Diagnosis not present

## 2023-07-02 DIAGNOSIS — M6281 Muscle weakness (generalized): Secondary | ICD-10-CM | POA: Diagnosis not present

## 2023-07-02 DIAGNOSIS — E118 Type 2 diabetes mellitus with unspecified complications: Secondary | ICD-10-CM | POA: Diagnosis not present

## 2023-07-02 LAB — HEPATITIS B SURFACE ANTIBODY, QUANTITATIVE: Hep B S AB Quant (Post): 3793 m[IU]/mL

## 2023-07-03 DIAGNOSIS — I959 Hypotension, unspecified: Secondary | ICD-10-CM | POA: Diagnosis not present

## 2023-07-03 DIAGNOSIS — M6281 Muscle weakness (generalized): Secondary | ICD-10-CM | POA: Diagnosis not present

## 2023-07-03 DIAGNOSIS — N186 End stage renal disease: Secondary | ICD-10-CM | POA: Diagnosis not present

## 2023-07-03 DIAGNOSIS — E118 Type 2 diabetes mellitus with unspecified complications: Secondary | ICD-10-CM | POA: Diagnosis not present

## 2023-07-03 DIAGNOSIS — D631 Anemia in chronic kidney disease: Secondary | ICD-10-CM | POA: Diagnosis not present

## 2023-07-03 DIAGNOSIS — N2581 Secondary hyperparathyroidism of renal origin: Secondary | ICD-10-CM | POA: Diagnosis not present

## 2023-07-03 DIAGNOSIS — D638 Anemia in other chronic diseases classified elsewhere: Secondary | ICD-10-CM | POA: Diagnosis not present

## 2023-07-03 LAB — CBC WITH DIFFERENTIAL/PLATELET
Abs Immature Granulocytes: 0.02 10*3/uL (ref 0.00–0.07)
Basophils Absolute: 0 10*3/uL (ref 0.0–0.1)
Basophils Relative: 0 %
Eosinophils Absolute: 0.5 10*3/uL (ref 0.0–0.5)
Eosinophils Relative: 10 %
HCT: 24.6 % — ABNORMAL LOW (ref 39.0–52.0)
Hemoglobin: 8 g/dL — ABNORMAL LOW (ref 13.0–17.0)
Immature Granulocytes: 0 %
Lymphocytes Relative: 25 %
Lymphs Abs: 1.2 10*3/uL (ref 0.7–4.0)
MCH: 31.4 pg (ref 26.0–34.0)
MCHC: 32.5 g/dL (ref 30.0–36.0)
MCV: 96.5 fL (ref 80.0–100.0)
Monocytes Absolute: 0.6 10*3/uL (ref 0.1–1.0)
Monocytes Relative: 13 %
Neutro Abs: 2.5 10*3/uL (ref 1.7–7.7)
Neutrophils Relative %: 52 %
Platelets: 154 10*3/uL (ref 150–400)
RBC: 2.55 MIL/uL — ABNORMAL LOW (ref 4.22–5.81)
RDW: 15 % (ref 11.5–15.5)
WBC: 5 10*3/uL (ref 4.0–10.5)
nRBC: 0 % (ref 0.0–0.2)

## 2023-07-03 LAB — RENAL FUNCTION PANEL
Albumin: 2.7 g/dL — ABNORMAL LOW (ref 3.5–5.0)
Anion gap: 11 (ref 5–15)
BUN: 43 mg/dL — ABNORMAL HIGH (ref 8–23)
CO2: 26 mmol/L (ref 22–32)
Calcium: 8.9 mg/dL (ref 8.9–10.3)
Chloride: 97 mmol/L — ABNORMAL LOW (ref 98–111)
Creatinine, Ser: 4.37 mg/dL — ABNORMAL HIGH (ref 0.61–1.24)
GFR, Estimated: 14 mL/min — ABNORMAL LOW (ref >60)
Glucose, Bld: 103 mg/dL — ABNORMAL HIGH (ref 70–99)
Phosphorus: 3.9 mg/dL (ref 2.5–4.6)
Potassium: 4.2 mmol/L (ref 3.5–5.1)
Sodium: 134 mmol/L — ABNORMAL LOW (ref 135–145)

## 2023-07-04 DIAGNOSIS — M6281 Muscle weakness (generalized): Secondary | ICD-10-CM | POA: Diagnosis not present

## 2023-07-04 DIAGNOSIS — D638 Anemia in other chronic diseases classified elsewhere: Secondary | ICD-10-CM | POA: Diagnosis not present

## 2023-07-04 DIAGNOSIS — N186 End stage renal disease: Secondary | ICD-10-CM | POA: Diagnosis not present

## 2023-07-04 DIAGNOSIS — E118 Type 2 diabetes mellitus with unspecified complications: Secondary | ICD-10-CM | POA: Diagnosis not present

## 2023-07-05 DIAGNOSIS — N186 End stage renal disease: Secondary | ICD-10-CM | POA: Diagnosis not present

## 2023-07-05 DIAGNOSIS — D638 Anemia in other chronic diseases classified elsewhere: Secondary | ICD-10-CM | POA: Diagnosis not present

## 2023-07-05 DIAGNOSIS — M6281 Muscle weakness (generalized): Secondary | ICD-10-CM | POA: Diagnosis not present

## 2023-07-05 DIAGNOSIS — E118 Type 2 diabetes mellitus with unspecified complications: Secondary | ICD-10-CM | POA: Diagnosis not present

## 2023-07-06 DIAGNOSIS — M6281 Muscle weakness (generalized): Secondary | ICD-10-CM | POA: Diagnosis not present

## 2023-07-06 DIAGNOSIS — E118 Type 2 diabetes mellitus with unspecified complications: Secondary | ICD-10-CM | POA: Diagnosis not present

## 2023-07-06 DIAGNOSIS — D638 Anemia in other chronic diseases classified elsewhere: Secondary | ICD-10-CM | POA: Diagnosis not present

## 2023-07-06 DIAGNOSIS — N186 End stage renal disease: Secondary | ICD-10-CM | POA: Diagnosis not present

## 2023-07-06 LAB — CBC WITH DIFFERENTIAL/PLATELET
Abs Immature Granulocytes: 0.02 10*3/uL (ref 0.00–0.07)
Basophils Absolute: 0 10*3/uL (ref 0.0–0.1)
Basophils Relative: 0 %
Eosinophils Absolute: 0.4 10*3/uL (ref 0.0–0.5)
Eosinophils Relative: 9 %
HCT: 24.1 % — ABNORMAL LOW (ref 39.0–52.0)
Hemoglobin: 8.2 g/dL — ABNORMAL LOW (ref 13.0–17.0)
Immature Granulocytes: 0 %
Lymphocytes Relative: 24 %
Lymphs Abs: 1.1 10*3/uL (ref 0.7–4.0)
MCH: 32.7 pg (ref 26.0–34.0)
MCHC: 34 g/dL (ref 30.0–36.0)
MCV: 96 fL (ref 80.0–100.0)
Monocytes Absolute: 0.5 10*3/uL (ref 0.1–1.0)
Monocytes Relative: 12 %
Neutro Abs: 2.6 10*3/uL (ref 1.7–7.7)
Neutrophils Relative %: 55 %
Platelets: 165 10*3/uL (ref 150–400)
RBC: 2.51 MIL/uL — ABNORMAL LOW (ref 4.22–5.81)
RDW: 15.2 % (ref 11.5–15.5)
WBC: 4.6 10*3/uL (ref 4.0–10.5)
nRBC: 0 % (ref 0.0–0.2)

## 2023-07-06 LAB — RENAL FUNCTION PANEL
Albumin: 2.7 g/dL — ABNORMAL LOW (ref 3.5–5.0)
Anion gap: 11 (ref 5–15)
BUN: 57 mg/dL — ABNORMAL HIGH (ref 8–23)
CO2: 23 mmol/L (ref 22–32)
Calcium: 8.9 mg/dL (ref 8.9–10.3)
Chloride: 102 mmol/L (ref 98–111)
Creatinine, Ser: 5.22 mg/dL — ABNORMAL HIGH (ref 0.61–1.24)
GFR, Estimated: 11 mL/min — ABNORMAL LOW (ref >60)
Glucose, Bld: 104 mg/dL — ABNORMAL HIGH (ref 70–99)
Phosphorus: 4.4 mg/dL (ref 2.5–4.6)
Potassium: 3.9 mmol/L (ref 3.5–5.1)
Sodium: 136 mmol/L (ref 135–145)

## 2023-07-07 DIAGNOSIS — D638 Anemia in other chronic diseases classified elsewhere: Secondary | ICD-10-CM | POA: Diagnosis not present

## 2023-07-07 DIAGNOSIS — N186 End stage renal disease: Secondary | ICD-10-CM | POA: Diagnosis not present

## 2023-07-07 DIAGNOSIS — E118 Type 2 diabetes mellitus with unspecified complications: Secondary | ICD-10-CM | POA: Diagnosis not present

## 2023-07-07 DIAGNOSIS — M6281 Muscle weakness (generalized): Secondary | ICD-10-CM | POA: Diagnosis not present

## 2023-07-08 LAB — RENAL FUNCTION PANEL
Albumin: 2.7 g/dL — ABNORMAL LOW (ref 3.5–5.0)
Anion gap: 8 (ref 5–15)
BUN: 47 mg/dL — ABNORMAL HIGH (ref 8–23)
CO2: 26 mmol/L (ref 22–32)
Calcium: 8.9 mg/dL (ref 8.9–10.3)
Chloride: 102 mmol/L (ref 98–111)
Creatinine, Ser: 4.56 mg/dL — ABNORMAL HIGH (ref 0.61–1.24)
GFR, Estimated: 13 mL/min — ABNORMAL LOW (ref >60)
Glucose, Bld: 112 mg/dL — ABNORMAL HIGH (ref 70–99)
Phosphorus: 4.3 mg/dL (ref 2.5–4.6)
Potassium: 3.8 mmol/L (ref 3.5–5.1)
Sodium: 136 mmol/L (ref 135–145)

## 2023-07-08 LAB — CBC WITH DIFFERENTIAL/PLATELET
Abs Immature Granulocytes: 0.02 10*3/uL (ref 0.00–0.07)
Basophils Absolute: 0 10*3/uL (ref 0.0–0.1)
Basophils Relative: 1 %
Eosinophils Absolute: 0.4 10*3/uL (ref 0.0–0.5)
Eosinophils Relative: 9 %
HCT: 24.6 % — ABNORMAL LOW (ref 39.0–52.0)
Hemoglobin: 8.2 g/dL — ABNORMAL LOW (ref 13.0–17.0)
Immature Granulocytes: 0 %
Lymphocytes Relative: 25 %
Lymphs Abs: 1.2 10*3/uL (ref 0.7–4.0)
MCH: 32.5 pg (ref 26.0–34.0)
MCHC: 33.3 g/dL (ref 30.0–36.0)
MCV: 97.6 fL (ref 80.0–100.0)
Monocytes Absolute: 0.6 10*3/uL (ref 0.1–1.0)
Monocytes Relative: 13 %
Neutro Abs: 2.6 10*3/uL (ref 1.7–7.7)
Neutrophils Relative %: 52 %
Platelets: 163 10*3/uL (ref 150–400)
RBC: 2.52 MIL/uL — ABNORMAL LOW (ref 4.22–5.81)
RDW: 15.1 % (ref 11.5–15.5)
WBC: 4.9 10*3/uL (ref 4.0–10.5)
nRBC: 0 % (ref 0.0–0.2)

## 2023-07-10 LAB — CBC WITH DIFFERENTIAL/PLATELET
Abs Immature Granulocytes: 0.01 10*3/uL (ref 0.00–0.07)
Basophils Absolute: 0 10*3/uL (ref 0.0–0.1)
Basophils Relative: 1 %
Eosinophils Absolute: 0.4 10*3/uL (ref 0.0–0.5)
Eosinophils Relative: 7 %
HCT: 26 % — ABNORMAL LOW (ref 39.0–52.0)
Hemoglobin: 8.7 g/dL — ABNORMAL LOW (ref 13.0–17.0)
Immature Granulocytes: 0 %
Lymphocytes Relative: 25 %
Lymphs Abs: 1.4 10*3/uL (ref 0.7–4.0)
MCH: 32 pg (ref 26.0–34.0)
MCHC: 33.5 g/dL (ref 30.0–36.0)
MCV: 95.6 fL (ref 80.0–100.0)
Monocytes Absolute: 0.6 10*3/uL (ref 0.1–1.0)
Monocytes Relative: 11 %
Neutro Abs: 3.2 10*3/uL (ref 1.7–7.7)
Neutrophils Relative %: 56 %
Platelets: 141 10*3/uL — ABNORMAL LOW (ref 150–400)
RBC: 2.72 MIL/uL — ABNORMAL LOW (ref 4.22–5.81)
RDW: 14.9 % (ref 11.5–15.5)
WBC: 5.6 10*3/uL (ref 4.0–10.5)
nRBC: 0 % (ref 0.0–0.2)

## 2023-07-10 LAB — RENAL FUNCTION PANEL
Albumin: 2.8 g/dL — ABNORMAL LOW (ref 3.5–5.0)
Anion gap: 11 (ref 5–15)
BUN: 52 mg/dL — ABNORMAL HIGH (ref 8–23)
CO2: 24 mmol/L (ref 22–32)
Calcium: 9.1 mg/dL (ref 8.9–10.3)
Chloride: 101 mmol/L (ref 98–111)
Creatinine, Ser: 4.93 mg/dL — ABNORMAL HIGH (ref 0.61–1.24)
GFR, Estimated: 12 mL/min — ABNORMAL LOW (ref >60)
Glucose, Bld: 111 mg/dL — ABNORMAL HIGH (ref 70–99)
Phosphorus: 4.4 mg/dL (ref 2.5–4.6)
Potassium: 3.9 mmol/L (ref 3.5–5.1)
Sodium: 136 mmol/L (ref 135–145)

## 2023-07-14 ENCOUNTER — Telehealth: Payer: Self-pay | Admitting: *Deleted

## 2023-07-14 NOTE — Telephone Encounter (Signed)
 Called wife & spoke with her She was able to put me on the phone with Louis Butler He didn't go to HD today because he wasn't feeling up to it. Last HD was on Friday, this was his first outpatient HD after discharge from [facility].  Louis Butler reports he plans to go to HD on Thursday. Reviewed with him that if he doesn't go I am worried he could become out of it or have breathing issues.  Advised that if he wants to stop HD that is ok and his choice, but would mean he would be approaching the end of his life.  He says he is going to HD Thursday. I scheduled them to come see me on Tuesday 4/15 at 11a outside of my usual clinic - he will come after HD that morning.  Offered to talk again if they need anything else in the meantime. If he elects to stop HD will need to involve hospice & palliative care.  Latrelle Dodrill, MD

## 2023-07-14 NOTE — Telephone Encounter (Signed)
 Pts wife calls to make an appt, but first available is 08/10/23.  Wife is requesting a call back because husband will not see another provider and he refuses to go to dialysis.  She thinks that he will not go back and wants "to just be made comfortable".  Wife is requesting a callback / mychart message from Dr. Pollie Meyer on what she should do next.  May 5 appt not made per wifes request. Jone Baseman, CMA

## 2023-07-21 ENCOUNTER — Encounter: Payer: Self-pay | Admitting: Family Medicine

## 2023-07-21 ENCOUNTER — Ambulatory Visit: Admitting: Family Medicine

## 2023-07-21 VITALS — BP 122/58 | HR 99 | Ht 75.0 in | Wt 188.0 lb

## 2023-07-21 DIAGNOSIS — D509 Iron deficiency anemia, unspecified: Secondary | ICD-10-CM | POA: Diagnosis not present

## 2023-07-21 DIAGNOSIS — Z0000 Encounter for general adult medical examination without abnormal findings: Secondary | ICD-10-CM | POA: Diagnosis not present

## 2023-07-21 DIAGNOSIS — R339 Retention of urine, unspecified: Secondary | ICD-10-CM | POA: Diagnosis not present

## 2023-07-21 DIAGNOSIS — C44201 Unspecified malignant neoplasm of skin of unspecified ear and external auricular canal: Secondary | ICD-10-CM

## 2023-07-21 DIAGNOSIS — N186 End stage renal disease: Secondary | ICD-10-CM

## 2023-07-21 DIAGNOSIS — Z122 Encounter for screening for malignant neoplasm of respiratory organs: Secondary | ICD-10-CM

## 2023-07-21 DIAGNOSIS — G7281 Critical illness myopathy: Secondary | ICD-10-CM | POA: Diagnosis not present

## 2023-07-21 DIAGNOSIS — Z1211 Encounter for screening for malignant neoplasm of colon: Secondary | ICD-10-CM | POA: Diagnosis not present

## 2023-07-21 DIAGNOSIS — E1129 Type 2 diabetes mellitus with other diabetic kidney complication: Secondary | ICD-10-CM

## 2023-07-21 DIAGNOSIS — E1122 Type 2 diabetes mellitus with diabetic chronic kidney disease: Secondary | ICD-10-CM | POA: Diagnosis not present

## 2023-07-21 DIAGNOSIS — I10 Essential (primary) hypertension: Secondary | ICD-10-CM

## 2023-07-21 DIAGNOSIS — Z992 Dependence on renal dialysis: Secondary | ICD-10-CM | POA: Diagnosis not present

## 2023-07-21 DIAGNOSIS — N2581 Secondary hyperparathyroidism of renal origin: Secondary | ICD-10-CM | POA: Diagnosis not present

## 2023-07-21 NOTE — Assessment & Plan Note (Signed)
 Will follow up with ENT at [facility]

## 2023-07-21 NOTE — Progress Notes (Signed)
  Date of Visit: 07/21/2023   SUBJECTIVE:   HPI:  Louis Butler presents today for hospital follow up.  Recently admitted to [facility] from 04/29/23 to 06/12/23 and then to [facility]) from 3/8 to 4/4. Started on HD during admission.  ESRD on HD - went to HD this morning, this was his first HD session since discharge from [facility] on 4/4. He was supposed to go last Thursday and this past Saturday, but didn't want to go so he didn't go. Reports he feels well. HD went well this morning. He was able to see his nephrologist Dr. Lydia Sams at the HD center today. Reports he plans to continue with HD.  Diabetes - not taking any insulin currently. Fasting sugars are in the 120s-130s.    OBJECTIVE:   BP (!) 122/58   Pulse 99   Ht 6\' 3"  (1.905 m)   Wt 188 lb (85.3 kg)   SpO2 100%   BMI 23.50 kg/m  Gen: no acute distress, pleasant cooperative HEENT: normocephalic, atraumatic  Heart: regular rate and rhythm, no murmur Lungs: clear to auscultation bilaterally, normal work of breathing  Neuro: alert, speech normal, grossly nonfocal Ext: minimal lower extremity edema bilaterally  ASSESSMENT/PLAN:   Assessment & Plan ESRD (end stage renal disease) (HCC) Tolerated HD today Discussed goals of care with patient, introduced concept of palliative care and suggested we get them involved. He will think about it. For now elects to continue with HD as outpatient. Reviewed risks of not attending HD and that if his goal is to sustain life then I would recommend he attend HD treatments. Type 2 diabetes mellitus with other diabetic kidney complication, without long-term current use of insulin (HCC) Doing well off insulin. Continue monitoring CBGs at home Primary hypertension Doing well, no longer needing midodrine at present Urinary retention Continues to have foley in place, functioning normally Advised call urology office to schedule follow up  Cancer of skin of ear and external  auditory canal Will follow up with ENT at [facility] Routine adult health maintenance Low dose CT lung cancer study ordered today Advised to schedule eye exam, will place referral as his eye doc retired Refer back to GI for colonoscopy as is now due   [place] J. Dawn Eth, MD [facility] Family Medicine

## 2023-07-21 NOTE — Patient Instructions (Signed)
 It was great to see you again today.  Schedule with urologist  Referring for colonoscopy, updated eye exam  Ordered CT lung cancer screening test - someone will call you or message you to schedule that.  Be well, Dr. Dawn Eth

## 2023-07-21 NOTE — Assessment & Plan Note (Signed)
 Doing well, no longer needing midodrine at present

## 2023-07-21 NOTE — Assessment & Plan Note (Signed)
 Low dose CT lung cancer study ordered today Advised to schedule eye exam, will place referral as his eye doc retired Refer back to GI for colonoscopy as is now due

## 2023-07-21 NOTE — Assessment & Plan Note (Signed)
 Continues to have foley in place, functioning normally Advised call urology office to schedule follow up

## 2023-07-21 NOTE — Assessment & Plan Note (Signed)
 Tolerated HD today Discussed goals of care with patient, introduced concept of palliative care and suggested we get them involved. He will think about it. For now elects to continue with HD as outpatient. Reviewed risks of not attending HD and that if his goal is to sustain life then I would recommend he attend HD treatments.

## 2023-07-21 NOTE — Assessment & Plan Note (Signed)
 Doing well off insulin. Continue monitoring CBGs at home

## 2023-07-23 DIAGNOSIS — Z992 Dependence on renal dialysis: Secondary | ICD-10-CM | POA: Diagnosis not present

## 2023-07-23 DIAGNOSIS — N2581 Secondary hyperparathyroidism of renal origin: Secondary | ICD-10-CM | POA: Diagnosis not present

## 2023-07-23 DIAGNOSIS — D509 Iron deficiency anemia, unspecified: Secondary | ICD-10-CM | POA: Diagnosis not present

## 2023-07-23 DIAGNOSIS — N186 End stage renal disease: Secondary | ICD-10-CM | POA: Diagnosis not present

## 2023-07-23 DIAGNOSIS — E1122 Type 2 diabetes mellitus with diabetic chronic kidney disease: Secondary | ICD-10-CM | POA: Diagnosis not present

## 2023-07-25 DIAGNOSIS — D509 Iron deficiency anemia, unspecified: Secondary | ICD-10-CM | POA: Diagnosis not present

## 2023-07-25 DIAGNOSIS — Z992 Dependence on renal dialysis: Secondary | ICD-10-CM | POA: Diagnosis not present

## 2023-07-25 DIAGNOSIS — N2581 Secondary hyperparathyroidism of renal origin: Secondary | ICD-10-CM | POA: Diagnosis not present

## 2023-07-25 DIAGNOSIS — E1122 Type 2 diabetes mellitus with diabetic chronic kidney disease: Secondary | ICD-10-CM | POA: Diagnosis not present

## 2023-07-25 DIAGNOSIS — N186 End stage renal disease: Secondary | ICD-10-CM | POA: Diagnosis not present

## 2023-07-28 DIAGNOSIS — Z992 Dependence on renal dialysis: Secondary | ICD-10-CM | POA: Diagnosis not present

## 2023-07-28 DIAGNOSIS — E1122 Type 2 diabetes mellitus with diabetic chronic kidney disease: Secondary | ICD-10-CM | POA: Diagnosis not present

## 2023-07-28 DIAGNOSIS — N186 End stage renal disease: Secondary | ICD-10-CM | POA: Diagnosis not present

## 2023-07-28 DIAGNOSIS — N2581 Secondary hyperparathyroidism of renal origin: Secondary | ICD-10-CM | POA: Diagnosis not present

## 2023-07-28 DIAGNOSIS — D509 Iron deficiency anemia, unspecified: Secondary | ICD-10-CM | POA: Diagnosis not present

## 2023-07-30 DIAGNOSIS — Z992 Dependence on renal dialysis: Secondary | ICD-10-CM | POA: Diagnosis not present

## 2023-07-30 DIAGNOSIS — E1122 Type 2 diabetes mellitus with diabetic chronic kidney disease: Secondary | ICD-10-CM | POA: Diagnosis not present

## 2023-07-30 DIAGNOSIS — N2581 Secondary hyperparathyroidism of renal origin: Secondary | ICD-10-CM | POA: Diagnosis not present

## 2023-07-30 DIAGNOSIS — D509 Iron deficiency anemia, unspecified: Secondary | ICD-10-CM | POA: Diagnosis not present

## 2023-07-30 DIAGNOSIS — N186 End stage renal disease: Secondary | ICD-10-CM | POA: Diagnosis not present

## 2023-08-01 ENCOUNTER — Other Ambulatory Visit: Payer: Self-pay | Admitting: Family Medicine

## 2023-08-03 ENCOUNTER — Ambulatory Visit (HOSPITAL_COMMUNITY)

## 2023-08-04 DIAGNOSIS — D509 Iron deficiency anemia, unspecified: Secondary | ICD-10-CM | POA: Diagnosis not present

## 2023-08-04 DIAGNOSIS — N186 End stage renal disease: Secondary | ICD-10-CM | POA: Diagnosis not present

## 2023-08-04 DIAGNOSIS — Z992 Dependence on renal dialysis: Secondary | ICD-10-CM | POA: Diagnosis not present

## 2023-08-04 DIAGNOSIS — N2581 Secondary hyperparathyroidism of renal origin: Secondary | ICD-10-CM | POA: Diagnosis not present

## 2023-08-04 DIAGNOSIS — E1122 Type 2 diabetes mellitus with diabetic chronic kidney disease: Secondary | ICD-10-CM | POA: Diagnosis not present

## 2023-08-05 DIAGNOSIS — N186 End stage renal disease: Secondary | ICD-10-CM | POA: Diagnosis not present

## 2023-08-05 DIAGNOSIS — I120 Hypertensive chronic kidney disease with stage 5 chronic kidney disease or end stage renal disease: Secondary | ICD-10-CM | POA: Diagnosis not present

## 2023-08-05 DIAGNOSIS — Z992 Dependence on renal dialysis: Secondary | ICD-10-CM | POA: Diagnosis not present

## 2023-08-06 DIAGNOSIS — N186 End stage renal disease: Secondary | ICD-10-CM | POA: Diagnosis not present

## 2023-08-06 DIAGNOSIS — N2581 Secondary hyperparathyroidism of renal origin: Secondary | ICD-10-CM | POA: Diagnosis not present

## 2023-08-06 DIAGNOSIS — Z992 Dependence on renal dialysis: Secondary | ICD-10-CM | POA: Diagnosis not present

## 2023-08-08 DIAGNOSIS — Z992 Dependence on renal dialysis: Secondary | ICD-10-CM | POA: Diagnosis not present

## 2023-08-08 DIAGNOSIS — N2581 Secondary hyperparathyroidism of renal origin: Secondary | ICD-10-CM | POA: Diagnosis not present

## 2023-08-08 DIAGNOSIS — N186 End stage renal disease: Secondary | ICD-10-CM | POA: Diagnosis not present

## 2023-08-11 DIAGNOSIS — N186 End stage renal disease: Secondary | ICD-10-CM | POA: Diagnosis not present

## 2023-08-11 DIAGNOSIS — Z992 Dependence on renal dialysis: Secondary | ICD-10-CM | POA: Diagnosis not present

## 2023-08-11 DIAGNOSIS — N2581 Secondary hyperparathyroidism of renal origin: Secondary | ICD-10-CM | POA: Diagnosis not present

## 2023-08-13 DIAGNOSIS — N2581 Secondary hyperparathyroidism of renal origin: Secondary | ICD-10-CM | POA: Diagnosis not present

## 2023-08-13 DIAGNOSIS — N186 End stage renal disease: Secondary | ICD-10-CM | POA: Diagnosis not present

## 2023-08-13 DIAGNOSIS — Z992 Dependence on renal dialysis: Secondary | ICD-10-CM | POA: Diagnosis not present

## 2023-08-15 DIAGNOSIS — Z992 Dependence on renal dialysis: Secondary | ICD-10-CM | POA: Diagnosis not present

## 2023-08-15 DIAGNOSIS — N2581 Secondary hyperparathyroidism of renal origin: Secondary | ICD-10-CM | POA: Diagnosis not present

## 2023-08-15 DIAGNOSIS — N186 End stage renal disease: Secondary | ICD-10-CM | POA: Diagnosis not present

## 2023-08-20 ENCOUNTER — Other Ambulatory Visit: Payer: Self-pay

## 2023-08-20 DIAGNOSIS — N186 End stage renal disease: Secondary | ICD-10-CM | POA: Diagnosis not present

## 2023-08-20 DIAGNOSIS — N2581 Secondary hyperparathyroidism of renal origin: Secondary | ICD-10-CM | POA: Diagnosis not present

## 2023-08-20 DIAGNOSIS — Z992 Dependence on renal dialysis: Secondary | ICD-10-CM | POA: Diagnosis not present

## 2023-08-25 DIAGNOSIS — Z992 Dependence on renal dialysis: Secondary | ICD-10-CM | POA: Diagnosis not present

## 2023-08-25 DIAGNOSIS — N2581 Secondary hyperparathyroidism of renal origin: Secondary | ICD-10-CM | POA: Diagnosis not present

## 2023-08-25 DIAGNOSIS — N186 End stage renal disease: Secondary | ICD-10-CM | POA: Diagnosis not present

## 2023-08-27 DIAGNOSIS — N186 End stage renal disease: Secondary | ICD-10-CM | POA: Diagnosis not present

## 2023-08-27 DIAGNOSIS — N2581 Secondary hyperparathyroidism of renal origin: Secondary | ICD-10-CM | POA: Diagnosis not present

## 2023-08-27 DIAGNOSIS — Z992 Dependence on renal dialysis: Secondary | ICD-10-CM | POA: Diagnosis not present

## 2023-09-05 DIAGNOSIS — N186 End stage renal disease: Secondary | ICD-10-CM | POA: Diagnosis not present

## 2023-09-05 DIAGNOSIS — N119 Chronic tubulo-interstitial nephritis, unspecified: Secondary | ICD-10-CM | POA: Diagnosis not present

## 2023-09-05 DIAGNOSIS — Z992 Dependence on renal dialysis: Secondary | ICD-10-CM | POA: Diagnosis not present

## 2023-09-07 ENCOUNTER — Encounter: Payer: Self-pay | Admitting: Gastroenterology

## 2023-09-08 DIAGNOSIS — D509 Iron deficiency anemia, unspecified: Secondary | ICD-10-CM | POA: Diagnosis not present

## 2023-09-08 DIAGNOSIS — N186 End stage renal disease: Secondary | ICD-10-CM | POA: Diagnosis not present

## 2023-09-08 DIAGNOSIS — D689 Coagulation defect, unspecified: Secondary | ICD-10-CM | POA: Diagnosis not present

## 2023-09-08 DIAGNOSIS — E1122 Type 2 diabetes mellitus with diabetic chronic kidney disease: Secondary | ICD-10-CM | POA: Diagnosis not present

## 2023-09-08 DIAGNOSIS — N2581 Secondary hyperparathyroidism of renal origin: Secondary | ICD-10-CM | POA: Diagnosis not present

## 2023-09-08 DIAGNOSIS — Z992 Dependence on renal dialysis: Secondary | ICD-10-CM | POA: Diagnosis not present

## 2023-09-09 ENCOUNTER — Other Ambulatory Visit: Payer: Self-pay

## 2023-09-10 ENCOUNTER — Telehealth: Payer: Self-pay

## 2023-09-10 DIAGNOSIS — D689 Coagulation defect, unspecified: Secondary | ICD-10-CM | POA: Diagnosis not present

## 2023-09-10 DIAGNOSIS — Z992 Dependence on renal dialysis: Secondary | ICD-10-CM | POA: Diagnosis not present

## 2023-09-10 DIAGNOSIS — N2581 Secondary hyperparathyroidism of renal origin: Secondary | ICD-10-CM | POA: Diagnosis not present

## 2023-09-10 DIAGNOSIS — N186 End stage renal disease: Secondary | ICD-10-CM | POA: Diagnosis not present

## 2023-09-10 DIAGNOSIS — E1122 Type 2 diabetes mellitus with diabetic chronic kidney disease: Secondary | ICD-10-CM | POA: Diagnosis not present

## 2023-09-10 DIAGNOSIS — D509 Iron deficiency anemia, unspecified: Secondary | ICD-10-CM | POA: Diagnosis not present

## 2023-09-10 NOTE — Telephone Encounter (Signed)
 RN called and spoke with wife, Sallyanne Creamer, and advised her that the patient currently being on dialysis requires the patient to have his procedure at the hospital;  Sallyanne Creamer agreed to this and verbalized understanding that the office would be back in contact once the doctor was notified of this information;   Dr. Brice Campi- does this patient need to have an OV or can he be a direct at the hospital? Please review and advise Thank you, Bre

## 2023-09-11 ENCOUNTER — Encounter: Payer: Self-pay | Admitting: Gastroenterology

## 2023-09-11 NOTE — Telephone Encounter (Signed)
 Patient has not been seen in greater than 3 years. Bring patient into clinic visit with me or Pod C APP and then we will schedule accordingly. GM

## 2023-09-11 NOTE — Telephone Encounter (Signed)
 RN attempted call to patient to schedule OV per Dr. Brice Campi. Patient did not answer the phone, but RN left detailed message, asking patient to call our offices to schedule the OV.

## 2023-09-12 DIAGNOSIS — N2581 Secondary hyperparathyroidism of renal origin: Secondary | ICD-10-CM | POA: Diagnosis not present

## 2023-09-12 DIAGNOSIS — N186 End stage renal disease: Secondary | ICD-10-CM | POA: Diagnosis not present

## 2023-09-12 DIAGNOSIS — D509 Iron deficiency anemia, unspecified: Secondary | ICD-10-CM | POA: Diagnosis not present

## 2023-09-12 DIAGNOSIS — E1122 Type 2 diabetes mellitus with diabetic chronic kidney disease: Secondary | ICD-10-CM | POA: Diagnosis not present

## 2023-09-12 DIAGNOSIS — Z992 Dependence on renal dialysis: Secondary | ICD-10-CM | POA: Diagnosis not present

## 2023-09-12 DIAGNOSIS — D689 Coagulation defect, unspecified: Secondary | ICD-10-CM | POA: Diagnosis not present

## 2023-09-14 NOTE — Telephone Encounter (Signed)
 It appears that the patient has called the office and has scheduled his OV with an APP;

## 2023-09-17 DIAGNOSIS — D509 Iron deficiency anemia, unspecified: Secondary | ICD-10-CM | POA: Diagnosis not present

## 2023-09-17 DIAGNOSIS — N2581 Secondary hyperparathyroidism of renal origin: Secondary | ICD-10-CM | POA: Diagnosis not present

## 2023-09-17 DIAGNOSIS — D689 Coagulation defect, unspecified: Secondary | ICD-10-CM | POA: Diagnosis not present

## 2023-09-17 DIAGNOSIS — N186 End stage renal disease: Secondary | ICD-10-CM | POA: Diagnosis not present

## 2023-09-17 DIAGNOSIS — Z992 Dependence on renal dialysis: Secondary | ICD-10-CM | POA: Diagnosis not present

## 2023-09-17 DIAGNOSIS — E1122 Type 2 diabetes mellitus with diabetic chronic kidney disease: Secondary | ICD-10-CM | POA: Diagnosis not present

## 2023-09-19 DIAGNOSIS — D509 Iron deficiency anemia, unspecified: Secondary | ICD-10-CM | POA: Diagnosis not present

## 2023-09-19 DIAGNOSIS — N2581 Secondary hyperparathyroidism of renal origin: Secondary | ICD-10-CM | POA: Diagnosis not present

## 2023-09-19 DIAGNOSIS — D689 Coagulation defect, unspecified: Secondary | ICD-10-CM | POA: Diagnosis not present

## 2023-09-19 DIAGNOSIS — E1122 Type 2 diabetes mellitus with diabetic chronic kidney disease: Secondary | ICD-10-CM | POA: Diagnosis not present

## 2023-09-19 DIAGNOSIS — Z992 Dependence on renal dialysis: Secondary | ICD-10-CM | POA: Diagnosis not present

## 2023-09-19 DIAGNOSIS — N186 End stage renal disease: Secondary | ICD-10-CM | POA: Diagnosis not present

## 2023-09-23 ENCOUNTER — Encounter

## 2023-09-24 DIAGNOSIS — D689 Coagulation defect, unspecified: Secondary | ICD-10-CM | POA: Diagnosis not present

## 2023-09-24 DIAGNOSIS — D509 Iron deficiency anemia, unspecified: Secondary | ICD-10-CM | POA: Diagnosis not present

## 2023-09-24 DIAGNOSIS — E1122 Type 2 diabetes mellitus with diabetic chronic kidney disease: Secondary | ICD-10-CM | POA: Diagnosis not present

## 2023-09-24 DIAGNOSIS — Z992 Dependence on renal dialysis: Secondary | ICD-10-CM | POA: Diagnosis not present

## 2023-09-24 DIAGNOSIS — N186 End stage renal disease: Secondary | ICD-10-CM | POA: Diagnosis not present

## 2023-09-24 DIAGNOSIS — N2581 Secondary hyperparathyroidism of renal origin: Secondary | ICD-10-CM | POA: Diagnosis not present

## 2023-09-26 DIAGNOSIS — D689 Coagulation defect, unspecified: Secondary | ICD-10-CM | POA: Diagnosis not present

## 2023-09-26 DIAGNOSIS — N186 End stage renal disease: Secondary | ICD-10-CM | POA: Diagnosis not present

## 2023-09-26 DIAGNOSIS — E1122 Type 2 diabetes mellitus with diabetic chronic kidney disease: Secondary | ICD-10-CM | POA: Diagnosis not present

## 2023-09-26 DIAGNOSIS — N2581 Secondary hyperparathyroidism of renal origin: Secondary | ICD-10-CM | POA: Diagnosis not present

## 2023-09-26 DIAGNOSIS — D509 Iron deficiency anemia, unspecified: Secondary | ICD-10-CM | POA: Diagnosis not present

## 2023-09-26 DIAGNOSIS — Z992 Dependence on renal dialysis: Secondary | ICD-10-CM | POA: Diagnosis not present

## 2023-09-29 DIAGNOSIS — N186 End stage renal disease: Secondary | ICD-10-CM | POA: Diagnosis not present

## 2023-09-29 DIAGNOSIS — D509 Iron deficiency anemia, unspecified: Secondary | ICD-10-CM | POA: Diagnosis not present

## 2023-09-29 DIAGNOSIS — N2581 Secondary hyperparathyroidism of renal origin: Secondary | ICD-10-CM | POA: Diagnosis not present

## 2023-09-29 DIAGNOSIS — Z992 Dependence on renal dialysis: Secondary | ICD-10-CM | POA: Diagnosis not present

## 2023-09-29 DIAGNOSIS — E1122 Type 2 diabetes mellitus with diabetic chronic kidney disease: Secondary | ICD-10-CM | POA: Diagnosis not present

## 2023-09-29 DIAGNOSIS — D689 Coagulation defect, unspecified: Secondary | ICD-10-CM | POA: Diagnosis not present

## 2023-10-01 DIAGNOSIS — D509 Iron deficiency anemia, unspecified: Secondary | ICD-10-CM | POA: Diagnosis not present

## 2023-10-01 DIAGNOSIS — Z992 Dependence on renal dialysis: Secondary | ICD-10-CM | POA: Diagnosis not present

## 2023-10-01 DIAGNOSIS — N2581 Secondary hyperparathyroidism of renal origin: Secondary | ICD-10-CM | POA: Diagnosis not present

## 2023-10-01 DIAGNOSIS — D689 Coagulation defect, unspecified: Secondary | ICD-10-CM | POA: Diagnosis not present

## 2023-10-01 DIAGNOSIS — N186 End stage renal disease: Secondary | ICD-10-CM | POA: Diagnosis not present

## 2023-10-01 DIAGNOSIS — E1122 Type 2 diabetes mellitus with diabetic chronic kidney disease: Secondary | ICD-10-CM | POA: Diagnosis not present

## 2023-10-03 DIAGNOSIS — Z992 Dependence on renal dialysis: Secondary | ICD-10-CM | POA: Diagnosis not present

## 2023-10-03 DIAGNOSIS — E1122 Type 2 diabetes mellitus with diabetic chronic kidney disease: Secondary | ICD-10-CM | POA: Diagnosis not present

## 2023-10-03 DIAGNOSIS — N2581 Secondary hyperparathyroidism of renal origin: Secondary | ICD-10-CM | POA: Diagnosis not present

## 2023-10-03 DIAGNOSIS — D509 Iron deficiency anemia, unspecified: Secondary | ICD-10-CM | POA: Diagnosis not present

## 2023-10-03 DIAGNOSIS — N186 End stage renal disease: Secondary | ICD-10-CM | POA: Diagnosis not present

## 2023-10-03 DIAGNOSIS — D689 Coagulation defect, unspecified: Secondary | ICD-10-CM | POA: Diagnosis not present

## 2023-10-05 DIAGNOSIS — Z992 Dependence on renal dialysis: Secondary | ICD-10-CM | POA: Diagnosis not present

## 2023-10-05 DIAGNOSIS — N186 End stage renal disease: Secondary | ICD-10-CM | POA: Diagnosis not present

## 2023-10-05 DIAGNOSIS — N119 Chronic tubulo-interstitial nephritis, unspecified: Secondary | ICD-10-CM | POA: Diagnosis not present

## 2023-10-08 DIAGNOSIS — Z992 Dependence on renal dialysis: Secondary | ICD-10-CM | POA: Diagnosis not present

## 2023-10-08 DIAGNOSIS — N186 End stage renal disease: Secondary | ICD-10-CM | POA: Diagnosis not present

## 2023-10-08 DIAGNOSIS — N2581 Secondary hyperparathyroidism of renal origin: Secondary | ICD-10-CM | POA: Diagnosis not present

## 2023-10-08 DIAGNOSIS — D689 Coagulation defect, unspecified: Secondary | ICD-10-CM | POA: Diagnosis not present

## 2023-10-10 DIAGNOSIS — N2581 Secondary hyperparathyroidism of renal origin: Secondary | ICD-10-CM | POA: Diagnosis not present

## 2023-10-10 DIAGNOSIS — Z992 Dependence on renal dialysis: Secondary | ICD-10-CM | POA: Diagnosis not present

## 2023-10-10 DIAGNOSIS — D689 Coagulation defect, unspecified: Secondary | ICD-10-CM | POA: Diagnosis not present

## 2023-10-10 DIAGNOSIS — N186 End stage renal disease: Secondary | ICD-10-CM | POA: Diagnosis not present

## 2023-10-13 DIAGNOSIS — N2581 Secondary hyperparathyroidism of renal origin: Secondary | ICD-10-CM | POA: Diagnosis not present

## 2023-10-13 DIAGNOSIS — D689 Coagulation defect, unspecified: Secondary | ICD-10-CM | POA: Diagnosis not present

## 2023-10-13 DIAGNOSIS — N186 End stage renal disease: Secondary | ICD-10-CM | POA: Diagnosis not present

## 2023-10-13 DIAGNOSIS — Z992 Dependence on renal dialysis: Secondary | ICD-10-CM | POA: Diagnosis not present

## 2023-10-14 ENCOUNTER — Encounter: Admitting: Gastroenterology

## 2023-10-17 DIAGNOSIS — N186 End stage renal disease: Secondary | ICD-10-CM | POA: Diagnosis not present

## 2023-10-17 DIAGNOSIS — D689 Coagulation defect, unspecified: Secondary | ICD-10-CM | POA: Diagnosis not present

## 2023-10-17 DIAGNOSIS — N2581 Secondary hyperparathyroidism of renal origin: Secondary | ICD-10-CM | POA: Diagnosis not present

## 2023-10-17 DIAGNOSIS — Z992 Dependence on renal dialysis: Secondary | ICD-10-CM | POA: Diagnosis not present

## 2023-10-20 DIAGNOSIS — D689 Coagulation defect, unspecified: Secondary | ICD-10-CM | POA: Diagnosis not present

## 2023-10-20 DIAGNOSIS — N186 End stage renal disease: Secondary | ICD-10-CM | POA: Diagnosis not present

## 2023-10-20 DIAGNOSIS — N2581 Secondary hyperparathyroidism of renal origin: Secondary | ICD-10-CM | POA: Diagnosis not present

## 2023-10-20 DIAGNOSIS — Z992 Dependence on renal dialysis: Secondary | ICD-10-CM | POA: Diagnosis not present

## 2023-10-20 NOTE — Progress Notes (Deleted)
 Louis Butler [id number] 01/30/1956   Chief Complaint: Discuss colonoscopy  Referring Provider: Donah Laymon PARAS, MD Primary GI MD: Dr. Wilhelmenia  HPI: Louis Butler is a 68 y.o. male with past medical history of arthritis, CKD, T2DM, ESRD, GERD, HTN, EPI, intestinal metaplasia of gastric mucosa, history of adenomatous colon polyps who presents today to discuss colonoscopy.  Patient last seen in office 09/27/2019 by Dr. Wilhelmenia.  Patient has been diagnosed with EPI based on low fecal elastase testing in setting of loose bowel movements.  He was initiated on Creon  but was unable to continue it due to concern of potential nausea symptoms.  Nausea suspected to be due to other systemic issues.  Patient now on hemodialysis.  Was advised he could try to reinitiate Creon  once adjusted to hemodialysis schedule.  Due for repeat EGD and colonoscopy.  (Intestinal metaplasia a risk factor for stomach cancer though not technically a precancerous finding)     Previous GI Procedures/Imaging   EGD 12/02/2019 - No gross lesions in esophagus. Z- line irregular, 40 cm from the incisors.  - Erythematous mucosa in the gastric body and prepyloric region of the stomach. No other gross lesions in the stomach. Gastric Mapping biopsies performed.  - No gross lesions in the duodenal bulb, in the first portion of the duodenum and in the second portion of the duodenum. - Repeat EGD recommended in 2-3 years. Path: 1. Surgical [P], gastric antrum - MILD CHRONIC GASTRITIS WITHOUT ACTIVITY - NO H. PYLORI OR INTESTINAL METAPLASIA IDENTIFIED - SEE COMMENT 2. Surgical [P], stomach, incisure - MILD CHRONIC GASTRITIS WITHOUT ACTIVITY - NO H. PYLORI OR INTESTINAL METAPLASIA IDENTIFIED - SEE COMMENT 3. Surgical [P], stomach, greater curve - MILD CHRONIC GASTRITIS WITH INTESTINAL METAPLASIA - NO H. PYLORI IDENTIFIED - SEE COMMENT 4. Surgical [P], stomach, lesser curve - MILD CHRONIC GASTRITIS WITH  INTESTINAL METAPLASIA - NO H. PYLORI IDENTIFIED - SEE COMMENT 5. Surgical [P], stomach, fundus/cardia - MILD CHRONIC GASTRITIS WITH INTESTINAL METAPLASIA - NO H. PYLORI IDENTIFIED - SEE COMMENT  Colonoscopy 06/28/2019 - Hemorrhoids found on digital rectal exam.  - The examined portion of the ileum was normal. Biopsied.  - Ten 2 to 10 mm polyps in the transverse colon, in the ascending colon and at the ileocecal valve, removed with a cold snare. Resected and retrieved.  - A single colonic angioectasia.  - Diverticulosis in the recto- sigmoid colon and in the sigmoid colon.  - Normal mucosa in the entire examined colon. Biopsied.  - Non- bleeding non-thrombosed internal hemorrhoids. - Recall 3 years Path: 3. Surgical [P], small bowel, terminal ileum - ILEAL MUCOSA WITH NO SPECIFIC HISTOPATHOLOGIC CHANGES - NEGATIVE FOR ACUTE INFLAMMATION, FEATURES OF CHRONICITY OR GRANULOMAS 4. Surgical [P], random colon biopsies - COLONIC MUCOSA WITH NO SPECIFIC HISTOPATHOLOGIC CHANGES - NEGATIVE FOR ACUTE INFLAMMATION, INCREASED INTRAEPITHELIAL LYMPHOCYTES OR THICKENED SUBEPITHELIAL COLLAGEN TABLE 5. Surgical [P], ascending, ileocecal valve, transverse, polyp (9) - TUBULAR ADENOMA(S) WITHOUT HIGH-GRADE DYSPLASIA OR MALIGNANCY - SESSILE SERRATED POLYP(S) WITHOUT CYTOLOGIC DYSPLASIA - HYPERPLASTIC POLYP(S)  EGD 06/28/2019 - No gross lesions in esophagus.  - Z- line irregular, 41 cm from the incisors.  - Erosive gastropathy with no bleeding and no stigmata of recent bleeding.  - No other gross lesions in the stomach. Biopsied.  - No gross lesions in the duodenal bulb, in the first portion of the duodenum and in the second portion of the duodenum. Biopsied. Path: 1. Surgical [P], duodenal biopsies - DUODENAL MUCOSA WITH NO SPECIFIC HISTOPATHOLOGIC  CHANGES - NEGATIVE FOR INCREASED INTRAEPITHELIAL LYMPHOCYTES OR VILLOUS ARCHITECTURAL CHANGES 2. Surgical [P], random gastric biopsies - GASTRIC ANTRAL  AND OXYNTIC MUCOSA WITH CHRONIC GASTRITIS, ATROPHY AND MULTIFOCAL INTESTINAL METAPLASIA. SEE NOTE - NEGATIVE FOR DYSPLASIA - WARTHIN STARRY STAIN IS NEGATIVE FOR HELICOBACTER PYLORI  Past Medical History:  Diagnosis Date   Arthritis    Cellulitis 03/14/2019   feet   CKD (chronic kidney disease)    Diabetes mellitus without complication (HCC)    Type II   End-stage kidney disease (HCC) 03/14/2019   was on hemodialysis ~ June 2021 - March 2022 when he discontinued; followed by Dr. Gordy Blanch   GERD (gastroesophageal reflux disease)    Heart murmur    History of blood transfusion    Hyperkalemia 04/29/2023   Hypertension    12/30/19- having low blood pressure since beginning   Irregular heart beat    Urinary tract infection  Bilateral Hydronephrosis 05/03/2023   UTI (urinary tract infection)     Past Surgical History:  Procedure Laterality Date   arm surgery Right    was cut to the bone   BASCILIC VEIN TRANSPOSITION Left 11/18/2019   Procedure: FIRST STAGE LEFT ARM BASCILIC VEIN TRANSPOSITION;  Surgeon: Serene Gaile ORN, MD;  Location: [facility] OR;  Service: Vascular;  Laterality: Left;   BASCILIC VEIN TRANSPOSITION Left 01/04/2020   Procedure: LEFT SECOND STAGE BASCILIC VEIN TRANSPOSITION;  Surgeon: Serene Gaile ORN, MD;  Location: [facility] OR;  Service: Vascular;  Laterality: Left;   COLONOSCOPY  06/2019   DIRECT LARYNGOSCOPY Bilateral 08/06/2022   Procedure: DIRECT LARYNGOSCOPY WITH BIOPSY;  Surgeon: Carlie Clark, MD;  [facility];  Service: ENT;  Laterality: Bilateral;   EYE SURGERY Bilateral    cataract   NASOPHARYNGEAL BIOPSY Left 08/06/2022   Procedure: NASOPHARYNGEAL BIOPSY;  Surgeon: Carlie Clark, MD;  Location: [facility] OR;  Service: ENT;  Laterality: Left;   TONSILLECTOMY Left 08/06/2022   Procedure: TONSILLECTOMY;  Surgeon: Carlie Clark, MD;  [facility];  Service: ENT;  Laterality: Left;   TRANSURETHRAL RESECTION OF PROSTATE N/A 08/08/2019   Procedure: TRANSURETHRAL RESECTION OF  THE PROSTATE (TURP);  Surgeon: Carolee Sherwood JONETTA DOUGLAS, MD;  Location: [facility] ORS;  Service: Urology;  Laterality: N/A;   UPPER GASTROINTESTINAL ENDOSCOPY  06/2019   WISDOM TOOTH EXTRACTION      Current Outpatient Medications  Medication Sig Dispense Refill   acetaminophen  (TYLENOL ) 500 MG tablet Take 1,000 mg by mouth every 6 (six) hours as needed for headache (pain).      blood glucose meter kit and supplies KIT Dispense based on patient and insurance preference. Use up to four times daily as directed. (FOR ICD-9 250.00, 250.01). 1 each 0   Cholecalciferol  (VITAMIN D3) 125 MCG (5000 UT) CAPS Take 5,000 Units by mouth daily.     esomeprazole  (NEXIUM ) 40 MG capsule Take 1 capsule (40 mg total) by mouth daily as needed (for heartburn).     gabapentin  (NEURONTIN ) 300 MG capsule Take 1 capsule (300 mg total) by mouth at bedtime. 90 capsule 3   Insulin  Syringes, Disposable, U-100 0.5 ML MISC Use as per instructions 3 times daily AC. 100 each 1   iron  sucrose (VENOFER ) Inject 110 mLs (200 mg total) into the vein every Monday, Wednesday, and Friday with hemodialysis.     loperamide  (IMODIUM  A-D) 2 MG tablet Take 1 tablet (2 mg total) by mouth 4 (four) times daily as needed for diarrhea or loose stools. 30 tablet 0   multivitamin (RENA-VIT)  TABS tablet Take 1 tablet by mouth at bedtime.     mupirocin ointment (BACTROBAN) 2 % Apply 1 Application topically 2 (two) times daily. Left ear canal     polyethylene glycol (MIRALAX  / GLYCOLAX ) 17 g packet Take 17 g by mouth daily as needed for moderate constipation. 14 each 0   sertraline  (ZOLOFT ) 50 MG tablet Take 1 tablet (50 mg total) by mouth daily.     tamsulosin  (FLOMAX ) 0.4 MG CAPS capsule Take 0.4 mg by mouth daily.     No current facility-administered medications for this visit.    Allergies as of 10/21/2023 - Review Complete 07/21/2023  Allergen Reaction Noted   Chlorhexidine  gluconate Itching 05/12/2023   Hydrocodone  Hives and Itching 03/14/2019    Percocet [oxycodone -acetaminophen ] Hives and Itching 09/04/2015    Family History  Problem Relation Age of Onset   Diabetes type II Mother    Lung cancer Mother    Hypertension Mother    Diabetes type II Father    Diabetes type II Sister    Diabetes type II Brother    Hypertension Brother    Other Son        burned   CAD Neg Hx    Colon cancer Neg Hx    Esophageal cancer Neg Hx    Inflammatory bowel disease Neg Hx    Liver disease Neg Hx    Pancreatic cancer Neg Hx    Rectal cancer Neg Hx    Stomach cancer Neg Hx     Social History   Tobacco Use   Smoking status: Former    Current packs/day: 0.00    Average packs/day: 0.5 packs/day for 52.5 years (25.6 ttl pk-yrs)    Types: Cigarettes    Start date: 03/07/1969    Quit date: 10/06/2022    Years since quitting: 1.0   Smokeless tobacco: Never  Vaping Use   Vaping status: Never Used  Substance Use Topics   Alcohol use: Not Currently    Comment: stopped drinking alcohol - November 2020   Drug use: No     Review of Systems:    Constitutional: No weight loss, fever, chills, weakness or fatigue Eyes: No change in vision Ears, Nose, Throat:  No change in hearing or congestion Skin: No rash or itching Cardiovascular: No chest pain, chest pressure or palpitations   Respiratory: No SOB or cough Gastrointestinal: See HPI and otherwise negative Genitourinary: No dysuria or change in urinary frequency Neurological: No headache, dizziness or syncope Musculoskeletal: No new muscle or joint pain Hematologic: No bleeding or bruising    Physical Exam:  Vital signs: There were no vitals taken for this visit.  Constitutional: NAD, Well developed, Well nourished, alert and cooperative Head:  Normocephalic and atraumatic.  Eyes: No scleral icterus. Conjunctiva pink. Mouth: No oral lesions. Respiratory: Respirations even and unlabored. Lungs clear to auscultation bilaterally.  No wheezes, crackles, or rhonchi.  Cardiovascular:   Regular rate and rhythm. No murmurs. No peripheral edema. Gastrointestinal:  Soft, nondistended, nontender. No rebound or guarding. Normal bowel sounds. No appreciable masses or hepatomegaly. Rectal:  Not performed.  Neurologic:  Alert and oriented x4;  grossly normal neurologically.  Skin:   Dry and intact without significant lesions or rashes. Psychiatric: Oriented to person, place and time. Demonstrates good judgement and reason without abnormal affect or behaviors.   RELEVANT LABS AND IMAGING: CBC    Component Value Date/Time   WBC 5.6 07/10/2023 0330   RBC 2.72 (L) 07/10/2023 0330  HGB 8.7 (L) 07/10/2023 0330   HGB 10.7 (L) 06/02/2022 1217   HCT 26.0 (L) 07/10/2023 0330   HCT 32.2 (L) 06/02/2022 1217   PLT 141 (L) 07/10/2023 0330   PLT 159 06/02/2022 1217   MCV 95.6 07/10/2023 0330   MCV 99 (H) 06/02/2022 1217   MCH 32.0 07/10/2023 0330   MCHC 33.5 07/10/2023 0330   RDW 14.9 07/10/2023 0330   RDW 13.0 06/02/2022 1217   LYMPHSABS 1.4 07/10/2023 0330   LYMPHSABS 1.6 06/02/2022 1217   MONOABS 0.6 07/10/2023 0330   EOSABS 0.4 07/10/2023 0330   EOSABS 0.3 06/02/2022 1217   BASOSABS 0.0 07/10/2023 0330   BASOSABS 0.1 06/02/2022 1217    CMP     Component Value Date/Time   NA 136 07/10/2023 0330   NA 133 (L) 06/10/2022 1528   K 3.9 07/10/2023 0330   CL 101 07/10/2023 0330   CO2 24 07/10/2023 0330   GLUCOSE 111 (H) 07/10/2023 0330   BUN 52 (H) 07/10/2023 0330   BUN 53 (H) 06/10/2022 1528   CREATININE 4.93 (H) 07/10/2023 0330   CALCIUM  9.1 07/10/2023 0330   CALCIUM  6.5 (LL) 04/30/2023 0410   PROT 7.0 06/15/2023 1039   PROT 7.1 06/02/2022 1217   ALBUMIN  2.8 (L) 07/10/2023 0330   ALBUMIN  3.9 06/02/2022 1217   AST 18 06/15/2023 1039   ALT 18 06/15/2023 1039   ALKPHOS 74 06/15/2023 1039   BILITOT 0.6 06/15/2023 1039   BILITOT 0.4 06/02/2022 1217   GFRNONAA 12 (L) 07/10/2023 0330   GFRAA 15 (L) 08/09/2019 0523     Assessment/Plan:    Schedule  EGD/Colonoscopy Hospital procedure    Camie Furbish, PA-C [facility] 10/20/2023, 8:47 AM  Patient Care Team: Donah Laymon PARAS, MD as PCP - General (Family Medicine) Center, [facility] Weyman Corning, RN as [facility], [facility]

## 2023-10-21 ENCOUNTER — Ambulatory Visit: Admitting: Gastroenterology

## 2023-10-22 DIAGNOSIS — D689 Coagulation defect, unspecified: Secondary | ICD-10-CM | POA: Diagnosis not present

## 2023-10-22 DIAGNOSIS — Z992 Dependence on renal dialysis: Secondary | ICD-10-CM | POA: Diagnosis not present

## 2023-10-22 DIAGNOSIS — N186 End stage renal disease: Secondary | ICD-10-CM | POA: Diagnosis not present

## 2023-10-22 DIAGNOSIS — N2581 Secondary hyperparathyroidism of renal origin: Secondary | ICD-10-CM | POA: Diagnosis not present

## 2023-10-24 DIAGNOSIS — N186 End stage renal disease: Secondary | ICD-10-CM | POA: Diagnosis not present

## 2023-10-24 DIAGNOSIS — Z992 Dependence on renal dialysis: Secondary | ICD-10-CM | POA: Diagnosis not present

## 2023-10-24 DIAGNOSIS — D689 Coagulation defect, unspecified: Secondary | ICD-10-CM | POA: Diagnosis not present

## 2023-10-24 DIAGNOSIS — N2581 Secondary hyperparathyroidism of renal origin: Secondary | ICD-10-CM | POA: Diagnosis not present

## 2023-10-26 ENCOUNTER — Other Ambulatory Visit: Payer: Self-pay | Admitting: Family Medicine

## 2023-10-26 NOTE — Telephone Encounter (Signed)
 Spoke with patient's wife and got him scheduled for 7/25. And No problem! Cassell Mary CMA

## 2023-10-26 NOTE — Telephone Encounter (Signed)
 Can you clarify if patient is taking famotidine ?  It is not on his medication list currently.  Thanks Laymon JINNY Legions, MD

## 2023-10-26 NOTE — Telephone Encounter (Signed)
 I mentioned the Famotidine  multiple times to the wife and she did confirm it was that medication but I was not confident if she really knew what meds he is on. I was going to make an him an appointment to go over his meds but was not sure if it was needed.

## 2023-10-26 NOTE — Telephone Encounter (Signed)
 Spoke with patient's wife, Ira Dougher, she explained that patient is currently taking Famotidine  due to his BP not being stable when he goes to dialysis. He is taking 1 tablet at 4am then another one at 9am on days he goes to dialysis.

## 2023-10-29 DIAGNOSIS — N186 End stage renal disease: Secondary | ICD-10-CM | POA: Diagnosis not present

## 2023-10-29 DIAGNOSIS — N2581 Secondary hyperparathyroidism of renal origin: Secondary | ICD-10-CM | POA: Diagnosis not present

## 2023-10-29 DIAGNOSIS — Z992 Dependence on renal dialysis: Secondary | ICD-10-CM | POA: Diagnosis not present

## 2023-10-29 DIAGNOSIS — D689 Coagulation defect, unspecified: Secondary | ICD-10-CM | POA: Diagnosis not present

## 2023-10-30 ENCOUNTER — Ambulatory Visit

## 2023-10-31 DIAGNOSIS — N186 End stage renal disease: Secondary | ICD-10-CM | POA: Diagnosis not present

## 2023-10-31 DIAGNOSIS — N2581 Secondary hyperparathyroidism of renal origin: Secondary | ICD-10-CM | POA: Diagnosis not present

## 2023-10-31 DIAGNOSIS — Z992 Dependence on renal dialysis: Secondary | ICD-10-CM | POA: Diagnosis not present

## 2023-10-31 DIAGNOSIS — D689 Coagulation defect, unspecified: Secondary | ICD-10-CM | POA: Diagnosis not present

## 2023-11-02 NOTE — Telephone Encounter (Signed)
 Appointment has been rescheduled by his wife Mar). 11/06/23 with Dr. Rosendo. Cassell Mary CMA

## 2023-11-02 NOTE — Telephone Encounter (Signed)
 Hey Ijbdypj-  It looks like he cancelled his appt on 10/30/23, can we call and see if he can reschedule with someone this week? Want to make sure he brings all his meds and we get a med rec.  Thanks, Dr Donzetta (covering for [place])

## 2023-11-05 DIAGNOSIS — N2581 Secondary hyperparathyroidism of renal origin: Secondary | ICD-10-CM | POA: Diagnosis not present

## 2023-11-05 DIAGNOSIS — N186 End stage renal disease: Secondary | ICD-10-CM | POA: Diagnosis not present

## 2023-11-05 DIAGNOSIS — N119 Chronic tubulo-interstitial nephritis, unspecified: Secondary | ICD-10-CM | POA: Diagnosis not present

## 2023-11-05 DIAGNOSIS — Z992 Dependence on renal dialysis: Secondary | ICD-10-CM | POA: Diagnosis not present

## 2023-11-05 DIAGNOSIS — D689 Coagulation defect, unspecified: Secondary | ICD-10-CM | POA: Diagnosis not present

## 2023-11-06 ENCOUNTER — Ambulatory Visit (INDEPENDENT_AMBULATORY_CARE_PROVIDER_SITE_OTHER): Admitting: Student

## 2023-11-06 VITALS — BP 156/73 | HR 87 | Ht 75.0 in | Wt 214.0 lb

## 2023-11-06 DIAGNOSIS — I10 Essential (primary) hypertension: Secondary | ICD-10-CM

## 2023-11-06 NOTE — Progress Notes (Signed)
    SUBJECTIVE:   CHIEF COMPLAINT / HPI:   68 year old male with history of T2DM and ESRD on HD on Tuesdays, Thursdays and Saturdays presenting today for blood pressure follow-up.  Patient has history of hypertension but not on any current meds due to her hypotensive blood pressure readings after dialysis.  Currently does not take home BP readings but according to wife who accompanied patient blood pressures are usually low during dialysis and patient's has recently started on midodrine  on the days of dialysis.  Wife reports excessive fatigue after HD and patient is considering home peritoneal dialysis, has scheduled consultation with surgery on August 15.  Previously he was on amlodipine  and carvedilol  which has been discontinued since then.  PERTINENT  PMH / PSH: Reviewed.  OBJECTIVE:   BP (!) 156/73   Pulse 87   Ht 6' 3 (1.905 m)   Wt 214 lb (97.1 kg)   SpO2 100%   BMI 26.75 kg/m    Physical Exam General: Alert, well appearing, NAD Cardiovascular: RRR, No Murmurs, Normal S2/S2 Respiratory: CTAB, No wheezing or Rales Abdomen: No distension or tenderness Extremities: No edema on extremities    ASSESSMENT/PLAN:   Hypertension BP today slightly elevated.  However given patient's history of orthostatic hypotension post HD recommend patient's continues without his blood pressure medications and to continue midodrine  day of his dialysis.  Encourage daily blood pressure checks with caregiver/ wife and return precaution discussed with patient.   Patient to follow-up with PCP after surgical consultation or initiation of peritoneal dialysis.  Norleen April, MD [facility]

## 2023-11-06 NOTE — Patient Instructions (Signed)
 Pleasure to see you today.  Please make sure you are no longer taking High blood pressure medications.  Please continue to take the midodrine  on your dialysis days.  Please check your blood pressures daily and if blood pressure is persistently above 170 please return to see your PCP.

## 2023-11-06 NOTE — Assessment & Plan Note (Signed)
 BP today slightly elevated.  However given patient's history of orthostatic hypotension post HD recommend patient's continues without his blood pressure medications and to continue midodrine  day of his dialysis.  Encourage daily blood pressure checks with caregiver/ wife and return precaution discussed with patient.

## 2023-11-12 DIAGNOSIS — N2581 Secondary hyperparathyroidism of renal origin: Secondary | ICD-10-CM | POA: Diagnosis not present

## 2023-11-12 DIAGNOSIS — N186 End stage renal disease: Secondary | ICD-10-CM | POA: Diagnosis not present

## 2023-11-12 DIAGNOSIS — D509 Iron deficiency anemia, unspecified: Secondary | ICD-10-CM | POA: Diagnosis not present

## 2023-11-12 DIAGNOSIS — D689 Coagulation defect, unspecified: Secondary | ICD-10-CM | POA: Diagnosis not present

## 2023-11-12 DIAGNOSIS — Z992 Dependence on renal dialysis: Secondary | ICD-10-CM | POA: Diagnosis not present

## 2023-11-17 DIAGNOSIS — D509 Iron deficiency anemia, unspecified: Secondary | ICD-10-CM | POA: Diagnosis not present

## 2023-11-17 DIAGNOSIS — Z992 Dependence on renal dialysis: Secondary | ICD-10-CM | POA: Diagnosis not present

## 2023-11-17 DIAGNOSIS — N2581 Secondary hyperparathyroidism of renal origin: Secondary | ICD-10-CM | POA: Diagnosis not present

## 2023-11-17 DIAGNOSIS — D689 Coagulation defect, unspecified: Secondary | ICD-10-CM | POA: Diagnosis not present

## 2023-11-17 DIAGNOSIS — N186 End stage renal disease: Secondary | ICD-10-CM | POA: Diagnosis not present

## 2023-11-19 DIAGNOSIS — N2581 Secondary hyperparathyroidism of renal origin: Secondary | ICD-10-CM | POA: Diagnosis not present

## 2023-11-19 DIAGNOSIS — D509 Iron deficiency anemia, unspecified: Secondary | ICD-10-CM | POA: Diagnosis not present

## 2023-11-19 DIAGNOSIS — Z992 Dependence on renal dialysis: Secondary | ICD-10-CM | POA: Diagnosis not present

## 2023-11-19 DIAGNOSIS — D689 Coagulation defect, unspecified: Secondary | ICD-10-CM | POA: Diagnosis not present

## 2023-11-19 DIAGNOSIS — N186 End stage renal disease: Secondary | ICD-10-CM | POA: Diagnosis not present

## 2023-11-20 DIAGNOSIS — D689 Coagulation defect, unspecified: Secondary | ICD-10-CM | POA: Diagnosis not present

## 2023-11-20 DIAGNOSIS — I120 Hypertensive chronic kidney disease with stage 5 chronic kidney disease or end stage renal disease: Secondary | ICD-10-CM | POA: Diagnosis not present

## 2023-11-20 DIAGNOSIS — D509 Iron deficiency anemia, unspecified: Secondary | ICD-10-CM | POA: Diagnosis not present

## 2023-11-20 DIAGNOSIS — Z992 Dependence on renal dialysis: Secondary | ICD-10-CM | POA: Diagnosis not present

## 2023-11-20 DIAGNOSIS — Z85819 Personal history of malignant neoplasm of unspecified site of lip, oral cavity, and pharynx: Secondary | ICD-10-CM | POA: Diagnosis not present

## 2023-11-20 DIAGNOSIS — I10 Essential (primary) hypertension: Secondary | ICD-10-CM | POA: Diagnosis not present

## 2023-11-20 DIAGNOSIS — E119 Type 2 diabetes mellitus without complications: Secondary | ICD-10-CM | POA: Diagnosis not present

## 2023-11-20 DIAGNOSIS — Z87891 Personal history of nicotine dependence: Secondary | ICD-10-CM | POA: Diagnosis not present

## 2023-11-20 DIAGNOSIS — K219 Gastro-esophageal reflux disease without esophagitis: Secondary | ICD-10-CM | POA: Diagnosis not present

## 2023-11-20 DIAGNOSIS — N2581 Secondary hyperparathyroidism of renal origin: Secondary | ICD-10-CM | POA: Diagnosis not present

## 2023-11-20 DIAGNOSIS — N186 End stage renal disease: Secondary | ICD-10-CM | POA: Diagnosis not present

## 2023-11-21 DIAGNOSIS — N2581 Secondary hyperparathyroidism of renal origin: Secondary | ICD-10-CM | POA: Diagnosis not present

## 2023-11-21 DIAGNOSIS — N186 End stage renal disease: Secondary | ICD-10-CM | POA: Diagnosis not present

## 2023-11-21 DIAGNOSIS — D689 Coagulation defect, unspecified: Secondary | ICD-10-CM | POA: Diagnosis not present

## 2023-11-21 DIAGNOSIS — D509 Iron deficiency anemia, unspecified: Secondary | ICD-10-CM | POA: Diagnosis not present

## 2023-11-21 DIAGNOSIS — Z992 Dependence on renal dialysis: Secondary | ICD-10-CM | POA: Diagnosis not present

## 2023-11-22 ENCOUNTER — Other Ambulatory Visit: Payer: Self-pay | Admitting: Family Medicine

## 2023-11-23 ENCOUNTER — Encounter: Payer: Self-pay | Admitting: Family Medicine

## 2023-11-23 ENCOUNTER — Ambulatory Visit (INDEPENDENT_AMBULATORY_CARE_PROVIDER_SITE_OTHER): Admitting: Family Medicine

## 2023-11-23 ENCOUNTER — Other Ambulatory Visit: Payer: Self-pay | Admitting: Family Medicine

## 2023-11-23 VITALS — BP 134/80 | HR 96 | Ht 75.0 in | Wt 212.4 lb

## 2023-11-23 DIAGNOSIS — I10 Essential (primary) hypertension: Secondary | ICD-10-CM

## 2023-11-23 DIAGNOSIS — R4589 Other symptoms and signs involving emotional state: Secondary | ICD-10-CM

## 2023-11-23 DIAGNOSIS — E1122 Type 2 diabetes mellitus with diabetic chronic kidney disease: Secondary | ICD-10-CM

## 2023-11-23 DIAGNOSIS — N186 End stage renal disease: Secondary | ICD-10-CM

## 2023-11-23 DIAGNOSIS — Z0000 Encounter for general adult medical examination without abnormal findings: Secondary | ICD-10-CM | POA: Diagnosis not present

## 2023-11-23 LAB — POCT GLYCOSYLATED HEMOGLOBIN (HGB A1C): HbA1c, POC (controlled diabetic range): 6.8 % (ref 0.0–7.0)

## 2023-11-23 MED ORDER — SERTRALINE HCL 50 MG PO TABS: 50.0000 mg | ORAL_TABLET | Freq: Every day | ORAL | 1 refills | Status: DC

## 2023-11-23 NOTE — Patient Instructions (Addendum)
 It was great to see you again today.  [facility] Ophthalmology - [facility] [phone] [address] [place], [place] [address] [phone]  Aloha Finner, MD [facility] [address] [place] [address], [place], [place] [address] Phone: [phone]  Restart sertraline  50mg  daily Follow up in 1 month for mood   See advanced directive packet  Be well, Dr. Donah

## 2023-11-23 NOTE — Assessment & Plan Note (Signed)
 uncontrolled - Restart sertraline  50 mg daily. Begin with half a pill for the first few days to allow the body to adjust, then increase to 50 mg daily. - follow up in 1 month to see how mood is doing

## 2023-11-23 NOTE — Progress Notes (Unsigned)
  Date of Visit: 11/23/2023   SUBJECTIVE:   HPI:  Discussed the use of AI scribe software for clinical note transcription with the patient, who gave verbal consent to proceed.  History of Present Illness Louis Butler is a 68 year old male with chronic kidney disease who presents for physical and routine follow-up.  Chronic kidney disease and dialysis preparation - Chronic kidney disease managed with nephrology follow-up. - Preparing for in-home peritoneal dialysis; awaiting scheduling for catheter placement. - Anticipates in-home dialysis will reduce travel burden for treatment. - on Trimethoprim 100 mg daily for urinary tract infection prophylaxis per urology  Mood disturbance - Mood instability impacts interpersonal interactions. - No suicidal or homicidal ideation. - Previously treated with sertraline  50 mg daily, but not currently taking it, did tolerate it well previously  Blood pressure fluctuations - Blood pressure managed with midodrine , taken before and sometimes during dialysis sessions for hypotension. - Home blood pressure readings as low as 114 mmHg systolic. - Recently obtained a wrist blood pressure monitor for home use.  Glycemic control - Not currently using insulin  - Hemoglobin A1c stable at 6.8, unchanged from six months prior. - Not on other diabetes medications.  Preventive health maintenance - No ophthalmology evaluation in over a year due to retirement of previous ophthalmologist. - Colonoscopy postponed and needs to be rescheduled due to other medical obligations. - Lung cancer screening CT not completed due to difficulty managing multiple appointments.    OBJECTIVE:   BP 134/80   Pulse 96   Ht 6' 3 (1.905 m)   Wt 212 lb 6.4 oz (96.3 kg)   SpO2 98%   BMI 26.55 kg/m  Gen: no acute distress, pleasant, cooperative, well appearing HEENT: normocephalic, atraumatic  Heart: regular rate and rhythm, no murmur Lungs: clear to auscultation  bilaterally, nwob Neuro: alert, grossly nonfocal, speech normal. Ext: No appreciable lower extremity edema bilaterally   ASSESSMENT/PLAN:   Assessment & Plan Type 2 diabetes mellitus with diabetic chronic kidney disease, unspecified CKD stage, unspecified whether long term insulin  use (HCC) A1c is well-managed at 6.8, same as six months ago. Continue to monitor periodically ESRD (end stage renal disease) (HCC) Currently undergoing dialysis and transitioning to in-home peritoneal dialysis. Midodrine  is used to manage blood pressure during dialysis sessions. - Continue with current dialysis regimen and transition to in-home peritoneal dialysis. - Continue midodrine  as needed to manage blood pressure during dialysis sessions. Depressed mood uncontrolled - Restart sertraline  50 mg daily. Begin with half a pill for the first few days to allow the body to adjust, then increase to 50 mg daily. - follow up in 1 month to see how mood is doing Routine adult health maintenance - provided info to contact GI doctor to schedule colonoscopy - provided info to contact ophthalmologist to schedule eye exam - provided handout on advanced directives for patient and wife to complete   FOLLOW UP: Follow up in 1 month  for mood  [name], MD [facility]

## 2023-11-23 NOTE — Assessment & Plan Note (Signed)
 A1c is well-managed at 6.8, same as six months ago. Continue to monitor periodically

## 2023-11-23 NOTE — Assessment & Plan Note (Signed)
 Currently undergoing dialysis and transitioning to in-home peritoneal dialysis. Midodrine  is used to manage blood pressure during dialysis sessions. - Continue with current dialysis regimen and transition to in-home peritoneal dialysis. - Continue midodrine  as needed to manage blood pressure during dialysis sessions.

## 2023-11-23 NOTE — Assessment & Plan Note (Signed)
 Blood pressure is monitored at home and during dialysis. Occasionally runs low, with systolic readings around 114 mmHg. Not on amlodipine  or carvedilol .

## 2023-11-25 NOTE — Assessment & Plan Note (Signed)
-   provided info to contact GI doctor to schedule colonoscopy - provided info to contact ophthalmologist to schedule eye exam - provided handout on advanced directives for patient and wife to complete

## 2023-11-26 DIAGNOSIS — N186 End stage renal disease: Secondary | ICD-10-CM | POA: Diagnosis not present

## 2023-11-26 DIAGNOSIS — N2581 Secondary hyperparathyroidism of renal origin: Secondary | ICD-10-CM | POA: Diagnosis not present

## 2023-11-26 DIAGNOSIS — D689 Coagulation defect, unspecified: Secondary | ICD-10-CM | POA: Diagnosis not present

## 2023-11-26 DIAGNOSIS — D509 Iron deficiency anemia, unspecified: Secondary | ICD-10-CM | POA: Diagnosis not present

## 2023-11-26 DIAGNOSIS — Z992 Dependence on renal dialysis: Secondary | ICD-10-CM | POA: Diagnosis not present

## 2023-11-28 DIAGNOSIS — D689 Coagulation defect, unspecified: Secondary | ICD-10-CM | POA: Diagnosis not present

## 2023-11-28 DIAGNOSIS — Z992 Dependence on renal dialysis: Secondary | ICD-10-CM | POA: Diagnosis not present

## 2023-11-28 DIAGNOSIS — N186 End stage renal disease: Secondary | ICD-10-CM | POA: Diagnosis not present

## 2023-11-28 DIAGNOSIS — N2581 Secondary hyperparathyroidism of renal origin: Secondary | ICD-10-CM | POA: Diagnosis not present

## 2023-11-28 DIAGNOSIS — D509 Iron deficiency anemia, unspecified: Secondary | ICD-10-CM | POA: Diagnosis not present

## 2023-12-01 ENCOUNTER — Other Ambulatory Visit: Payer: Self-pay | Admitting: *Deleted

## 2023-12-01 MED ORDER — FAMOTIDINE 20 MG PO TABS: 20.0000 mg | ORAL_TABLET | Freq: Every day | ORAL | 11 refills | Status: AC

## 2023-12-03 ENCOUNTER — Other Ambulatory Visit: Payer: Self-pay | Admitting: *Deleted

## 2023-12-03 DIAGNOSIS — D509 Iron deficiency anemia, unspecified: Secondary | ICD-10-CM | POA: Diagnosis not present

## 2023-12-03 DIAGNOSIS — Z992 Dependence on renal dialysis: Secondary | ICD-10-CM | POA: Diagnosis not present

## 2023-12-03 DIAGNOSIS — N2581 Secondary hyperparathyroidism of renal origin: Secondary | ICD-10-CM | POA: Diagnosis not present

## 2023-12-03 DIAGNOSIS — D689 Coagulation defect, unspecified: Secondary | ICD-10-CM | POA: Diagnosis not present

## 2023-12-03 DIAGNOSIS — N186 End stage renal disease: Secondary | ICD-10-CM | POA: Diagnosis not present

## 2023-12-03 MED ORDER — GABAPENTIN 300 MG PO CAPS: 300.0000 mg | ORAL_CAPSULE | Freq: Every day | ORAL | 3 refills | Status: AC

## 2023-12-06 DIAGNOSIS — Z992 Dependence on renal dialysis: Secondary | ICD-10-CM | POA: Diagnosis not present

## 2023-12-06 DIAGNOSIS — N119 Chronic tubulo-interstitial nephritis, unspecified: Secondary | ICD-10-CM | POA: Diagnosis not present

## 2023-12-06 DIAGNOSIS — N186 End stage renal disease: Secondary | ICD-10-CM | POA: Diagnosis not present

## 2023-12-08 DIAGNOSIS — D689 Coagulation defect, unspecified: Secondary | ICD-10-CM | POA: Diagnosis not present

## 2023-12-08 DIAGNOSIS — N2581 Secondary hyperparathyroidism of renal origin: Secondary | ICD-10-CM | POA: Diagnosis not present

## 2023-12-08 DIAGNOSIS — Z992 Dependence on renal dialysis: Secondary | ICD-10-CM | POA: Diagnosis not present

## 2023-12-08 DIAGNOSIS — N186 End stage renal disease: Secondary | ICD-10-CM | POA: Diagnosis not present

## 2023-12-08 DIAGNOSIS — D509 Iron deficiency anemia, unspecified: Secondary | ICD-10-CM | POA: Diagnosis not present

## 2023-12-08 DIAGNOSIS — E1122 Type 2 diabetes mellitus with diabetic chronic kidney disease: Secondary | ICD-10-CM | POA: Diagnosis not present

## 2023-12-09 DIAGNOSIS — K219 Gastro-esophageal reflux disease without esophagitis: Secondary | ICD-10-CM | POA: Diagnosis not present

## 2023-12-09 DIAGNOSIS — Z794 Long term (current) use of insulin: Secondary | ICD-10-CM | POA: Diagnosis not present

## 2023-12-09 DIAGNOSIS — E875 Hyperkalemia: Secondary | ICD-10-CM | POA: Diagnosis not present

## 2023-12-09 DIAGNOSIS — D649 Anemia, unspecified: Secondary | ICD-10-CM | POA: Diagnosis not present

## 2023-12-09 DIAGNOSIS — Z886 Allergy status to analgesic agent status: Secondary | ICD-10-CM | POA: Diagnosis not present

## 2023-12-09 DIAGNOSIS — I499 Cardiac arrhythmia, unspecified: Secondary | ICD-10-CM | POA: Diagnosis not present

## 2023-12-09 DIAGNOSIS — Z888 Allergy status to other drugs, medicaments and biological substances status: Secondary | ICD-10-CM | POA: Diagnosis not present

## 2023-12-09 DIAGNOSIS — E1122 Type 2 diabetes mellitus with diabetic chronic kidney disease: Secondary | ICD-10-CM | POA: Diagnosis not present

## 2023-12-09 DIAGNOSIS — I120 Hypertensive chronic kidney disease with stage 5 chronic kidney disease or end stage renal disease: Secondary | ICD-10-CM | POA: Diagnosis not present

## 2023-12-09 DIAGNOSIS — N186 End stage renal disease: Secondary | ICD-10-CM | POA: Diagnosis not present

## 2023-12-09 DIAGNOSIS — I10 Essential (primary) hypertension: Secondary | ICD-10-CM | POA: Diagnosis not present

## 2023-12-09 DIAGNOSIS — E118 Type 2 diabetes mellitus with unspecified complications: Secondary | ICD-10-CM | POA: Diagnosis not present

## 2023-12-09 DIAGNOSIS — Z8521 Personal history of malignant neoplasm of larynx: Secondary | ICD-10-CM | POA: Diagnosis not present

## 2023-12-09 DIAGNOSIS — Z8673 Personal history of transient ischemic attack (TIA), and cerebral infarction without residual deficits: Secondary | ICD-10-CM | POA: Diagnosis not present

## 2023-12-09 DIAGNOSIS — D696 Thrombocytopenia, unspecified: Secondary | ICD-10-CM | POA: Diagnosis not present

## 2023-12-10 DIAGNOSIS — D689 Coagulation defect, unspecified: Secondary | ICD-10-CM | POA: Diagnosis not present

## 2023-12-10 DIAGNOSIS — D509 Iron deficiency anemia, unspecified: Secondary | ICD-10-CM | POA: Diagnosis not present

## 2023-12-10 DIAGNOSIS — N186 End stage renal disease: Secondary | ICD-10-CM | POA: Diagnosis not present

## 2023-12-10 DIAGNOSIS — N2581 Secondary hyperparathyroidism of renal origin: Secondary | ICD-10-CM | POA: Diagnosis not present

## 2023-12-10 DIAGNOSIS — Z992 Dependence on renal dialysis: Secondary | ICD-10-CM | POA: Diagnosis not present

## 2023-12-10 DIAGNOSIS — E1122 Type 2 diabetes mellitus with diabetic chronic kidney disease: Secondary | ICD-10-CM | POA: Diagnosis not present

## 2023-12-15 DIAGNOSIS — Z992 Dependence on renal dialysis: Secondary | ICD-10-CM | POA: Diagnosis not present

## 2023-12-15 DIAGNOSIS — D689 Coagulation defect, unspecified: Secondary | ICD-10-CM | POA: Diagnosis not present

## 2023-12-15 DIAGNOSIS — N2581 Secondary hyperparathyroidism of renal origin: Secondary | ICD-10-CM | POA: Diagnosis not present

## 2023-12-15 DIAGNOSIS — E1122 Type 2 diabetes mellitus with diabetic chronic kidney disease: Secondary | ICD-10-CM | POA: Diagnosis not present

## 2023-12-15 DIAGNOSIS — D509 Iron deficiency anemia, unspecified: Secondary | ICD-10-CM | POA: Diagnosis not present

## 2023-12-15 DIAGNOSIS — N186 End stage renal disease: Secondary | ICD-10-CM | POA: Diagnosis not present

## 2023-12-19 DIAGNOSIS — E1122 Type 2 diabetes mellitus with diabetic chronic kidney disease: Secondary | ICD-10-CM | POA: Diagnosis not present

## 2023-12-19 DIAGNOSIS — N186 End stage renal disease: Secondary | ICD-10-CM | POA: Diagnosis not present

## 2023-12-19 DIAGNOSIS — Z992 Dependence on renal dialysis: Secondary | ICD-10-CM | POA: Diagnosis not present

## 2023-12-19 DIAGNOSIS — D689 Coagulation defect, unspecified: Secondary | ICD-10-CM | POA: Diagnosis not present

## 2023-12-19 DIAGNOSIS — N2581 Secondary hyperparathyroidism of renal origin: Secondary | ICD-10-CM | POA: Diagnosis not present

## 2023-12-19 DIAGNOSIS — D509 Iron deficiency anemia, unspecified: Secondary | ICD-10-CM | POA: Diagnosis not present

## 2023-12-22 DIAGNOSIS — N2581 Secondary hyperparathyroidism of renal origin: Secondary | ICD-10-CM | POA: Diagnosis not present

## 2023-12-22 DIAGNOSIS — D689 Coagulation defect, unspecified: Secondary | ICD-10-CM | POA: Diagnosis not present

## 2023-12-22 DIAGNOSIS — D509 Iron deficiency anemia, unspecified: Secondary | ICD-10-CM | POA: Diagnosis not present

## 2023-12-22 DIAGNOSIS — E1122 Type 2 diabetes mellitus with diabetic chronic kidney disease: Secondary | ICD-10-CM | POA: Diagnosis not present

## 2023-12-22 DIAGNOSIS — N186 End stage renal disease: Secondary | ICD-10-CM | POA: Diagnosis not present

## 2023-12-22 DIAGNOSIS — Z992 Dependence on renal dialysis: Secondary | ICD-10-CM | POA: Diagnosis not present

## 2023-12-24 DIAGNOSIS — I120 Hypertensive chronic kidney disease with stage 5 chronic kidney disease or end stage renal disease: Secondary | ICD-10-CM | POA: Diagnosis not present

## 2023-12-24 DIAGNOSIS — I10 Essential (primary) hypertension: Secondary | ICD-10-CM | POA: Diagnosis not present

## 2023-12-24 DIAGNOSIS — E119 Type 2 diabetes mellitus without complications: Secondary | ICD-10-CM | POA: Diagnosis not present

## 2023-12-29 ENCOUNTER — Ambulatory Visit (INDEPENDENT_AMBULATORY_CARE_PROVIDER_SITE_OTHER): Admitting: Family Medicine

## 2023-12-29 ENCOUNTER — Other Ambulatory Visit: Payer: Self-pay

## 2023-12-29 ENCOUNTER — Encounter: Payer: Self-pay | Admitting: Family Medicine

## 2023-12-29 VITALS — BP 117/80 | HR 81 | Ht 75.0 in | Wt 206.0 lb

## 2023-12-29 DIAGNOSIS — N186 End stage renal disease: Secondary | ICD-10-CM | POA: Diagnosis not present

## 2023-12-29 DIAGNOSIS — Z23 Encounter for immunization: Secondary | ICD-10-CM

## 2023-12-29 DIAGNOSIS — N2581 Secondary hyperparathyroidism of renal origin: Secondary | ICD-10-CM | POA: Diagnosis not present

## 2023-12-29 DIAGNOSIS — R4589 Other symptoms and signs involving emotional state: Secondary | ICD-10-CM

## 2023-12-29 DIAGNOSIS — Z0000 Encounter for general adult medical examination without abnormal findings: Secondary | ICD-10-CM | POA: Diagnosis not present

## 2023-12-29 DIAGNOSIS — D509 Iron deficiency anemia, unspecified: Secondary | ICD-10-CM | POA: Diagnosis not present

## 2023-12-29 DIAGNOSIS — Z992 Dependence on renal dialysis: Secondary | ICD-10-CM | POA: Diagnosis not present

## 2023-12-29 DIAGNOSIS — E1122 Type 2 diabetes mellitus with diabetic chronic kidney disease: Secondary | ICD-10-CM | POA: Diagnosis not present

## 2023-12-29 DIAGNOSIS — D689 Coagulation defect, unspecified: Secondary | ICD-10-CM | POA: Diagnosis not present

## 2023-12-29 MED ORDER — SERTRALINE HCL 100 MG PO TABS: 100.0000 mg | ORAL_TABLET | Freq: Every day | ORAL | 1 refills | Status: DC

## 2023-12-29 NOTE — Patient Instructions (Signed)
 It was great to see you again today.  Increase sertraline  to 100mg  daily  Flu and COVID vaccines today  Follow up in 1 month   Be well, Dr. Donah

## 2023-12-29 NOTE — Progress Notes (Unsigned)
  Date of Visit: 12/29/2023   SUBJECTIVE:   HPI:  Discussed the use of AI scribe software for clinical note transcription with the patient, who gave verbal consent to proceed.  History of Present Illness Louis Butler is a 68 year old male with chronic kidney disease on dialysis who presents for follow-up after restarting sertraline . He is accompanied by his wife.  Renal failure and dialysis-related symptoms - Chronic kidney disease on dialysis - Undergoing hemodialysis, experiences post-dialysis malaise described as 'crummy' and 'just takes it out of you' - Recently had a catheter placed for peritoneal dialysis - Scheduled to begin peritoneal dialysis training at home - Current medications for renal management include Renvela, sodium bicarbonate  twice daily, Veltassa  8.4 grams once daily, midodrine  5 mg with dialysis, and others as listed  Mood disturbance - Restarted sertraline  for mood management, currently taking 50 mg daily - No improvement in mood since restarting sertraline  - No thoughts of self-harm or harm to others - amenable to increasing dose  Preventive health maintenance - Eye exam not yet scheduled due to backlog, awaiting return call - Colonoscopy not yet scheduled due to dialysis commitments   OBJECTIVE:   BP 117/80   Pulse 81   Ht 6' 3 (1.905 m)   Wt 206 lb (93.4 kg)   SpO2 98%   BMI 25.75 kg/m  Gen: no acute distress HEENT: normocephalic, atraumatic  Heart: regular rate and rhythm Lungs: normal work of breathing  Neuro: alert, speech normal, grossly nonfocal Ext: No appreciable lower extremity edema bilaterally   ASSESSMENT/PLAN:   Assessment & Plan Depressed mood No significant mood improvement on 50 mg sertraline . Denied self-harm thoughts. - Increase sertraline  to 100 mg daily. - Hopefully transition to peritoneal dialysis will also be helpful with mood - Follow up in one month to assess mood and medication efficacy. Routine adult health  maintenance COVID & flu vaccines today   FOLLOW UP: Follow up in 1 mo for mood  [name], MD [facility]

## 2023-12-30 DIAGNOSIS — Z992 Dependence on renal dialysis: Secondary | ICD-10-CM | POA: Diagnosis not present

## 2023-12-30 DIAGNOSIS — N2581 Secondary hyperparathyroidism of renal origin: Secondary | ICD-10-CM | POA: Diagnosis not present

## 2023-12-30 DIAGNOSIS — N186 End stage renal disease: Secondary | ICD-10-CM | POA: Diagnosis not present

## 2023-12-31 ENCOUNTER — Telehealth: Payer: Self-pay | Admitting: Family Medicine

## 2023-12-31 DIAGNOSIS — N2581 Secondary hyperparathyroidism of renal origin: Secondary | ICD-10-CM | POA: Diagnosis not present

## 2023-12-31 DIAGNOSIS — N186 End stage renal disease: Secondary | ICD-10-CM | POA: Diagnosis not present

## 2023-12-31 DIAGNOSIS — Z992 Dependence on renal dialysis: Secondary | ICD-10-CM | POA: Diagnosis not present

## 2023-12-31 NOTE — Telephone Encounter (Signed)
 Placed in MDs box to be filled out. Charee Tumblin [place], CMA

## 2023-12-31 NOTE — Assessment & Plan Note (Signed)
 No significant mood improvement on 50 mg sertraline . Denied self-harm thoughts. - Increase sertraline  to 100 mg daily. - Hopefully transition to peritoneal dialysis will also be helpful with mood - Follow up in one month to assess mood and medication efficacy.

## 2023-12-31 NOTE — Assessment & Plan Note (Signed)
 COVID & flu vaccines today

## 2024-01-01 ENCOUNTER — Telehealth: Payer: Self-pay

## 2024-01-01 DIAGNOSIS — N2581 Secondary hyperparathyroidism of renal origin: Secondary | ICD-10-CM | POA: Diagnosis not present

## 2024-01-01 DIAGNOSIS — N186 End stage renal disease: Secondary | ICD-10-CM | POA: Diagnosis not present

## 2024-01-01 DIAGNOSIS — Z992 Dependence on renal dialysis: Secondary | ICD-10-CM | POA: Diagnosis not present

## 2024-01-01 NOTE — Telephone Encounter (Signed)
 Received VM from Reneta at [facility] regarding refill on Lantus .   Per chart review this is not on medication list and appears to have been discontinued on 06/13/23 after hospital discharge.   Will forward to PCP to confirm that patient should not be administering Lantus .   Chiquita JAYSON English, RN

## 2024-01-04 DIAGNOSIS — N2581 Secondary hyperparathyroidism of renal origin: Secondary | ICD-10-CM | POA: Diagnosis not present

## 2024-01-04 DIAGNOSIS — Z992 Dependence on renal dialysis: Secondary | ICD-10-CM | POA: Diagnosis not present

## 2024-01-04 DIAGNOSIS — N186 End stage renal disease: Secondary | ICD-10-CM | POA: Diagnosis not present

## 2024-01-05 DIAGNOSIS — N119 Chronic tubulo-interstitial nephritis, unspecified: Secondary | ICD-10-CM | POA: Diagnosis not present

## 2024-01-05 DIAGNOSIS — N186 End stage renal disease: Secondary | ICD-10-CM | POA: Diagnosis not present

## 2024-01-05 DIAGNOSIS — Z992 Dependence on renal dialysis: Secondary | ICD-10-CM | POA: Diagnosis not present

## 2024-01-05 DIAGNOSIS — N2581 Secondary hyperparathyroidism of renal origin: Secondary | ICD-10-CM | POA: Diagnosis not present

## 2024-01-05 NOTE — Telephone Encounter (Signed)
 That is correct, he is not on lantus  anymore Thanks Laymon JINNY Legions, MD

## 2024-01-05 NOTE — Telephone Encounter (Signed)
Forms completed, will return to [facility] RN team. Leeanne Rio, MD

## 2024-01-07 NOTE — Telephone Encounter (Signed)
 [facility] calls back informed that pt removed from Lantus . Harlene Carte, CMA

## 2024-01-07 NOTE — Telephone Encounter (Signed)
 Patient's wife in clinic today with provider. Dayshia asked about status of paperwork. Provided paperwork to Dayshia and asked that she make copy, place in batch scanning and provide original to patient.   Chiquita JAYSON English, RN

## 2024-02-17 ENCOUNTER — Other Ambulatory Visit: Payer: Self-pay | Admitting: Family Medicine

## 2024-02-17 NOTE — Telephone Encounter (Signed)
Please let patient know I am refilling this medication, but he needs to schedule an appointment with me.   Thanks, Jakorian Marengo J Jodiann Ognibene, MD  

## 2024-03-07 ENCOUNTER — Ambulatory Visit: Admitting: Family Medicine

## 2024-03-14 ENCOUNTER — Other Ambulatory Visit: Payer: Self-pay | Admitting: Family Medicine

## 2024-03-18 ENCOUNTER — Encounter: Payer: Self-pay | Admitting: Family Medicine

## 2024-04-05 ENCOUNTER — Inpatient Hospital Stay (HOSPITAL_COMMUNITY)
Admission: EM | Admit: 2024-04-05 | Discharge: 2024-04-18 | DRG: 312 | Disposition: A | Discharge status: Home / Self Care | Source: Other Acute Inpatient Hospital | Attending: Family Medicine | Admitting: Family Medicine

## 2024-04-05 ENCOUNTER — Encounter (HOSPITAL_COMMUNITY): Payer: Self-pay | Admitting: Emergency Medicine

## 2024-04-05 ENCOUNTER — Other Ambulatory Visit: Payer: Self-pay

## 2024-04-05 DIAGNOSIS — Z9079 Acquired absence of other genital organ(s): Secondary | ICD-10-CM

## 2024-04-05 DIAGNOSIS — N250 Renal osteodystrophy: Secondary | ICD-10-CM | POA: Diagnosis present

## 2024-04-05 DIAGNOSIS — K3184 Gastroparesis: Secondary | ICD-10-CM | POA: Diagnosis present

## 2024-04-05 DIAGNOSIS — R339 Retention of urine, unspecified: Secondary | ICD-10-CM | POA: Diagnosis present

## 2024-04-05 DIAGNOSIS — F329 Major depressive disorder, single episode, unspecified: Secondary | ICD-10-CM | POA: Diagnosis present

## 2024-04-05 DIAGNOSIS — Z885 Allergy status to narcotic agent status: Secondary | ICD-10-CM

## 2024-04-05 DIAGNOSIS — N2581 Secondary hyperparathyroidism of renal origin: Secondary | ICD-10-CM | POA: Diagnosis present

## 2024-04-05 DIAGNOSIS — Z6824 Body mass index (BMI) 24.0-24.9, adult: Secondary | ICD-10-CM

## 2024-04-05 DIAGNOSIS — E860 Dehydration: Secondary | ICD-10-CM | POA: Diagnosis present

## 2024-04-05 DIAGNOSIS — I951 Orthostatic hypotension: Principal | ICD-10-CM | POA: Diagnosis present

## 2024-04-05 DIAGNOSIS — R110 Nausea: Secondary | ICD-10-CM | POA: Insufficient documentation

## 2024-04-05 DIAGNOSIS — Z789 Other specified health status: Secondary | ICD-10-CM

## 2024-04-05 DIAGNOSIS — N400 Enlarged prostate without lower urinary tract symptoms: Secondary | ICD-10-CM | POA: Diagnosis present

## 2024-04-05 DIAGNOSIS — D329 Benign neoplasm of meninges, unspecified: Secondary | ICD-10-CM | POA: Diagnosis present

## 2024-04-05 DIAGNOSIS — E871 Hypo-osmolality and hyponatremia: Secondary | ICD-10-CM | POA: Diagnosis present

## 2024-04-05 DIAGNOSIS — Z992 Dependence on renal dialysis: Secondary | ICD-10-CM

## 2024-04-05 DIAGNOSIS — J810 Acute pulmonary edema: Secondary | ICD-10-CM | POA: Diagnosis not present

## 2024-04-05 DIAGNOSIS — Z833 Family history of diabetes mellitus: Secondary | ICD-10-CM

## 2024-04-05 DIAGNOSIS — J811 Chronic pulmonary edema: Secondary | ICD-10-CM | POA: Diagnosis present

## 2024-04-05 DIAGNOSIS — N1330 Unspecified hydronephrosis: Secondary | ICD-10-CM

## 2024-04-05 DIAGNOSIS — E876 Hypokalemia: Secondary | ICD-10-CM | POA: Diagnosis present

## 2024-04-05 DIAGNOSIS — Z79899 Other long term (current) drug therapy: Secondary | ICD-10-CM

## 2024-04-05 DIAGNOSIS — E1143 Type 2 diabetes mellitus with diabetic autonomic (poly)neuropathy: Secondary | ICD-10-CM | POA: Diagnosis present

## 2024-04-05 DIAGNOSIS — Z886 Allergy status to analgesic agent status: Secondary | ICD-10-CM

## 2024-04-05 DIAGNOSIS — N136 Pyonephrosis: Principal | ICD-10-CM | POA: Diagnosis present

## 2024-04-05 DIAGNOSIS — E1122 Type 2 diabetes mellitus with diabetic chronic kidney disease: Secondary | ICD-10-CM | POA: Diagnosis present

## 2024-04-05 DIAGNOSIS — Z960 Presence of urogenital implants: Secondary | ICD-10-CM | POA: Diagnosis present

## 2024-04-05 DIAGNOSIS — Z8249 Family history of ischemic heart disease and other diseases of the circulatory system: Secondary | ICD-10-CM

## 2024-04-05 DIAGNOSIS — D631 Anemia in chronic kidney disease: Secondary | ICD-10-CM | POA: Diagnosis present

## 2024-04-05 DIAGNOSIS — Z87891 Personal history of nicotine dependence: Secondary | ICD-10-CM

## 2024-04-05 DIAGNOSIS — Z515 Encounter for palliative care: Secondary | ICD-10-CM

## 2024-04-05 DIAGNOSIS — K5900 Constipation, unspecified: Secondary | ICD-10-CM | POA: Diagnosis present

## 2024-04-05 DIAGNOSIS — I480 Paroxysmal atrial fibrillation: Secondary | ICD-10-CM | POA: Diagnosis present

## 2024-04-05 DIAGNOSIS — K8020 Calculus of gallbladder without cholecystitis without obstruction: Secondary | ICD-10-CM | POA: Diagnosis present

## 2024-04-05 DIAGNOSIS — R531 Weakness: Principal | ICD-10-CM

## 2024-04-05 DIAGNOSIS — R42 Dizziness and giddiness: Secondary | ICD-10-CM

## 2024-04-05 DIAGNOSIS — E440 Moderate protein-calorie malnutrition: Secondary | ICD-10-CM | POA: Diagnosis present

## 2024-04-05 DIAGNOSIS — K219 Gastro-esophageal reflux disease without esophagitis: Secondary | ICD-10-CM | POA: Diagnosis present

## 2024-04-05 DIAGNOSIS — R627 Adult failure to thrive: Secondary | ICD-10-CM | POA: Diagnosis present

## 2024-04-05 DIAGNOSIS — Z801 Family history of malignant neoplasm of trachea, bronchus and lung: Secondary | ICD-10-CM

## 2024-04-05 DIAGNOSIS — D509 Iron deficiency anemia, unspecified: Secondary | ICD-10-CM | POA: Diagnosis present

## 2024-04-05 DIAGNOSIS — I120 Hypertensive chronic kidney disease with stage 5 chronic kidney disease or end stage renal disease: Secondary | ICD-10-CM | POA: Diagnosis present

## 2024-04-05 DIAGNOSIS — Z883 Allergy status to other anti-infective agents status: Secondary | ICD-10-CM

## 2024-04-05 DIAGNOSIS — I499 Cardiac arrhythmia, unspecified: Secondary | ICD-10-CM

## 2024-04-05 DIAGNOSIS — N186 End stage renal disease: Secondary | ICD-10-CM | POA: Diagnosis present

## 2024-04-05 DIAGNOSIS — R519 Headache, unspecified: Secondary | ICD-10-CM

## 2024-04-05 DIAGNOSIS — N390 Urinary tract infection, site not specified: Secondary | ICD-10-CM | POA: Diagnosis present

## 2024-04-05 LAB — COMPREHENSIVE METABOLIC PANEL WITH GFR
ALT: <5 U/L (ref 0–44)
AST: 14 U/L — ABNORMAL LOW (ref 15–41)
Albumin: 3.3 g/dL — ABNORMAL LOW (ref 3.5–5.0)
Alkaline Phosphatase: 89 U/L (ref 38–126)
Anion gap: 20 — ABNORMAL HIGH (ref 5–15)
BUN: 48 mg/dL — ABNORMAL HIGH (ref 8–23)
CO2: 23 mmol/L (ref 22–32)
Calcium: 7.8 mg/dL — ABNORMAL LOW (ref 8.9–10.3)
Chloride: 84 mmol/L — ABNORMAL LOW (ref 98–111)
Creatinine, Ser: 13.8 mg/dL — ABNORMAL HIGH (ref 0.61–1.24)
GFR, Estimated: 4 mL/min — ABNORMAL LOW
Glucose, Bld: 143 mg/dL — ABNORMAL HIGH (ref 70–99)
Potassium: 3 mmol/L — ABNORMAL LOW (ref 3.5–5.1)
Sodium: 126 mmol/L — ABNORMAL LOW (ref 135–145)
Total Bilirubin: 0.5 mg/dL (ref 0.0–1.2)
Total Protein: 8.2 g/dL — ABNORMAL HIGH (ref 6.5–8.1)

## 2024-04-05 LAB — CBC
HCT: 28.8 % — ABNORMAL LOW (ref 39.0–52.0)
Hemoglobin: 9.5 g/dL — ABNORMAL LOW (ref 13.0–17.0)
MCH: 32.6 pg (ref 26.0–34.0)
MCHC: 33 g/dL (ref 30.0–36.0)
MCV: 99 fL (ref 80.0–100.0)
Platelets: 229 K/uL (ref 150–400)
RBC: 2.91 MIL/uL — ABNORMAL LOW (ref 4.22–5.81)
RDW: 14.8 % (ref 11.5–15.5)
WBC: 11.6 K/uL — ABNORMAL HIGH (ref 4.0–10.5)
nRBC: 0 % (ref 0.0–0.2)

## 2024-04-05 LAB — CBG MONITORING, ED: Glucose-Capillary: 127 mg/dL — ABNORMAL HIGH (ref 70–99)

## 2024-04-05 LAB — TROPONIN T, HIGH SENSITIVITY: Troponin T High Sensitivity: 62 ng/L — ABNORMAL HIGH (ref 0–19)

## 2024-04-05 NOTE — ED Provider Triage Note (Signed)
 Emergency Medicine Provider Triage Evaluation Note  Louis Butler , a 68 y.o. male  was evaluated in triage.  Pt complains of patient is a 68 year old male with past medical history of hypertension, diabetes, GERD, end-stage renal disease on peritoneal dialysis presenting for complaints of lightheadedness and dizziness upon standing.  Last peritoneal dialysis was last night.  Blood pressure reported by EMS was 96/58.  Otherwise stable vital signs.  Glucose 224.  Blood pressure on arrival to ED 102/63.  Patient states symptoms are worse upon standing.  Denies any chest pain or shortness of breath.  Denies any fevers, chills, nausea, vomiting, diarrhea.  Denies melena or hematochezia.. Review of Systems  Positive: See HPI Negative: See HPI  Physical Exam  BP 102/63 (BP Location: Right Arm)   Pulse 75   Temp 98.1 F (36.7 C) (Oral)   Resp 18   Ht 6' 3 (1.905 m)   Wt 89.4 kg   SpO2 97%   BMI 24.62 kg/m  Gen:   Awake, no distress, disheveled appearance Resp:  Normal effort, no respiratory distress Abdomen: Abdomen soft and nontender MSK:   Moves extremities without difficulty   No fluid overloaded state   Medical Decision Making  Medically screening exam initiated at 8:54 PM.  Appropriate orders placed.  Louis Butler was informed that the remainder of the evaluation will be completed by another provider, this initial triage assessment does not replace that evaluation, and the importance of remaining in the ED until their evaluation is complete.     Louis Bernarda SQUIBB, DO 04/05/24 2057

## 2024-04-05 NOTE — ED Triage Notes (Signed)
 BIB [facility] EMS for hypotension and generalized weakness. Pt has dizziness standing. EMS unable to obtain standing BP r/t dizziness. Pt has peritoneal dialysis and completed last night at approx 2200-2300. Pt had foley, but last week it accidentally got removed. Pt is c/o poor regularity with BM.   BP 96/58 HR 78-84 NSR Spo2 100 RA CBG 224

## 2024-04-06 ENCOUNTER — Emergency Department (HOSPITAL_COMMUNITY)

## 2024-04-06 ENCOUNTER — Telehealth: Payer: Self-pay | Admitting: Family Medicine

## 2024-04-06 DIAGNOSIS — E871 Hypo-osmolality and hyponatremia: Secondary | ICD-10-CM | POA: Diagnosis not present

## 2024-04-06 DIAGNOSIS — R531 Weakness: Secondary | ICD-10-CM | POA: Diagnosis not present

## 2024-04-06 DIAGNOSIS — R627 Adult failure to thrive: Secondary | ICD-10-CM | POA: Diagnosis present

## 2024-04-06 DIAGNOSIS — N186 End stage renal disease: Secondary | ICD-10-CM

## 2024-04-06 DIAGNOSIS — R42 Dizziness and giddiness: Secondary | ICD-10-CM

## 2024-04-06 DIAGNOSIS — Z992 Dependence on renal dialysis: Secondary | ICD-10-CM | POA: Diagnosis not present

## 2024-04-06 LAB — URINALYSIS, ROUTINE W REFLEX MICROSCOPIC
Bilirubin Urine: NEGATIVE
Glucose, UA: NEGATIVE mg/dL
Ketones, ur: NEGATIVE mg/dL
Nitrite: NEGATIVE
Protein, ur: >=300 mg/dL — AB
RBC / HPF: >50 RBC/hpf (ref 0–5)
Specific Gravity, Urine: 1.01 (ref 1.005–1.030)
WBC, UA: >50 WBC/hpf (ref 0–5)
pH: 7 (ref 5.0–8.0)

## 2024-04-06 LAB — SODIUM, URINE, RANDOM: Sodium, Ur: 109 mmol/L

## 2024-04-06 LAB — HIV ANTIBODY (ROUTINE TESTING W REFLEX): HIV Screen 4th Generation wRfx: NONREACTIVE

## 2024-04-06 LAB — OSMOLALITY: Osmolality: 283 mosm/kg (ref 275–295)

## 2024-04-06 LAB — OSMOLALITY, URINE: Osmolality, Ur: 272 mosm/kg — ABNORMAL LOW (ref 300–900)

## 2024-04-06 LAB — MAGNESIUM: Magnesium: 2 mg/dL (ref 1.7–2.4)

## 2024-04-06 LAB — TSH: TSH: 2.84 u[IU]/mL (ref 0.350–4.500)

## 2024-04-06 MED ORDER — SEVELAMER CARBONATE 800 MG PO TABS
2400.0000 mg | ORAL_TABLET | Freq: Three times a day (TID) | ORAL | Status: DC
  Administered 2024-04-06 – 2024-04-10 (×11): 2400 mg via ORAL
  Filled 2024-04-06 (×11): qty 3

## 2024-04-06 MED ORDER — HYDROXYZINE HCL 25 MG PO TABS
25.0000 mg | ORAL_TABLET | Freq: Every day | ORAL | Status: DC | PRN
  Administered 2024-04-11 – 2024-04-13 (×3): 25 mg via ORAL
  Filled 2024-04-06 (×4): qty 1

## 2024-04-06 MED ORDER — IOHEXOL 350 MG/ML SOLN
100.0000 mL | Freq: Once | INTRAVENOUS | Status: AC | PRN
  Administered 2024-04-06: 100 mL via INTRAVENOUS

## 2024-04-06 MED ORDER — POLYETHYLENE GLYCOL 3350 17 G PO PACK: 17.0000 g | PACK | Freq: Every day | ORAL | Status: DC | PRN

## 2024-04-06 MED ORDER — HEPARIN SODIUM (PORCINE) 5000 UNIT/ML IJ SOLN
5000.0000 [IU] | Freq: Three times a day (TID) | INTRAMUSCULAR | Status: DC
  Administered 2024-04-06 – 2024-04-18 (×34): 5000 [IU] via SUBCUTANEOUS
  Filled 2024-04-06 (×34): qty 1

## 2024-04-06 MED ORDER — ACETAMINOPHEN 500 MG PO TABS
1000.0000 mg | ORAL_TABLET | Freq: Four times a day (QID) | ORAL | Status: DC | PRN
  Administered 2024-04-06 – 2024-04-12 (×2): 1000 mg via ORAL
  Filled 2024-04-06 (×2): qty 2

## 2024-04-06 MED ORDER — TAMSULOSIN HCL 0.4 MG PO CAPS
0.4000 mg | ORAL_CAPSULE | Freq: Every day | ORAL | Status: DC
  Administered 2024-04-06 – 2024-04-15 (×10): 0.4 mg via ORAL
  Filled 2024-04-06 (×10): qty 1

## 2024-04-06 MED ORDER — SODIUM BICARBONATE 650 MG PO TABS: 650.0000 mg | ORAL_TABLET | Freq: Two times a day (BID) | ORAL | Status: DC

## 2024-04-06 MED ORDER — DELFLEX-LC/1.5% DEXTROSE 344 MOSM/L IP SOLN: INTRAPERITONEAL | Status: DC

## 2024-04-06 MED ORDER — GENTAMICIN SULFATE 0.1 % EX CREA
1.0000 | TOPICAL_CREAM | Freq: Every day | CUTANEOUS | Status: DC
  Administered 2024-04-07 – 2024-04-15 (×11): 1 via TOPICAL
  Filled 2024-04-06: qty 15

## 2024-04-06 MED ORDER — TRIMETHOPRIM 100 MG PO TABS
100.0000 mg | ORAL_TABLET | Freq: Every day | ORAL | Status: DC
  Administered 2024-04-06: 100 mg via ORAL
  Filled 2024-04-06: qty 1

## 2024-04-06 MED ORDER — POTASSIUM CHLORIDE CRYS ER 20 MEQ PO TBCR
20.0000 meq | EXTENDED_RELEASE_TABLET | Freq: Once | ORAL | Status: AC
  Administered 2024-04-06: 20 meq via ORAL
  Filled 2024-04-06: qty 1

## 2024-04-06 MED ORDER — SERTRALINE HCL 100 MG PO TABS
100.0000 mg | ORAL_TABLET | Freq: Every day | ORAL | Status: DC
  Administered 2024-04-06 – 2024-04-18 (×13): 100 mg via ORAL
  Filled 2024-04-06 (×13): qty 1

## 2024-04-06 MED ORDER — PANTOPRAZOLE SODIUM 40 MG PO TBEC: 40.0000 mg | DELAYED_RELEASE_TABLET | Freq: Every day | ORAL | Status: DC | PRN

## 2024-04-06 MED ORDER — SODIUM CHLORIDE 0.9 % IV SOLN
2.0000 g | Freq: Once | INTRAVENOUS | Status: AC
  Administered 2024-04-06: 2 g via INTRAVENOUS
  Filled 2024-04-06: qty 20

## 2024-04-06 MED ORDER — POLYETHYLENE GLYCOL 3350 17 G PO PACK
17.0000 g | PACK | Freq: Every day | ORAL | Status: DC
  Administered 2024-04-06: 17 g via ORAL
  Filled 2024-04-06 (×8): qty 1

## 2024-04-06 MED ORDER — GABAPENTIN 300 MG PO CAPS
300.0000 mg | ORAL_CAPSULE | Freq: Every day | ORAL | Status: DC
  Administered 2024-04-06 – 2024-04-17 (×12): 300 mg via ORAL
  Filled 2024-04-06 (×12): qty 1

## 2024-04-06 MED ORDER — SODIUM CHLORIDE 0.9 % IV BOLUS
500.0000 mL | Freq: Once | INTRAVENOUS | Status: AC
  Administered 2024-04-06: 500 mL via INTRAVENOUS

## 2024-04-06 NOTE — ED Notes (Signed)
 Patient transported to CT

## 2024-04-06 NOTE — H&P (Addendum)
 "    Hospital Admission History and Physical Service Pager: [phone]  Patient name: Louis Butler Medical record number: [id number] Date of Birth: [birth year] Age: 68 y.o. Gender: male  Primary Care Provider: Donah Laymon PARAS, MD Consultants: Nephrology  Code Status: Full Preferred Emergency Contact:  Contact Information     Name Relation Home Work Mobile   Hicklin,Rhonda Spouse [phone]        Other Contacts     Name Relation Home Work Mobile   Little,Christina Daughter [phone]  [phone]   LEIGH BOYER Other   [phone]        Chief Complaint: Dizziness  Differential and Medical Decision Making:  Louis Butler is a 68 y.o. male presenting with weakness, dizziness, and malaise.  Differential for this patient's presentation of this includes FTT (progressively declining function with poor PO and appetite), UTI (UA abnormal with hx of pseudomonas in Ucxs, however patient denies UTI symptoms), progression of ESRD (Hx of ESRD with recent switch to PD).  Of note, patient was diagnosed with acute renal failure in 1/25 and required ICU admission for CRRT. He was then started on HD which was switched to PD in August of this year due to poor adherence to HD schedule 2/2 fatigue and weakness.   Assessment & Plan ESRD on peritoneal dialysis [facility]) Urinary retention with chronic foley Patient with long standing ESRD. Switch from HD to PD this fall, adherent at home. Na 126, K 3.0. UOP progressively decreased to now minimal with chronic indwelling foley 2/2 to retention.  -Admit to FMTS, medsurg, attending Dr Rosalynn -Nephro consulted, continue peritoneal dialysis per their recs  -Strict I/Os, watch UOP with foley in place -Continue home sevelamer - Palliative consult  -AM RFP Failure to thrive in adult Dizziness Orthostatic hypotension Progressive decline in function, appetite, PO intake. Significant orthostasis recently w/ hx of the same. Patient was previously on  midodrine  with HD, but has not continued to take it after transition to PD.  - Encourage PO - RD consult  - Palliative consult as above  - PT/OT eval and treat Constipation No BM last 1 week per patient - Miralax  daily Chronic health problem BPH: continue home tamsulosin  GERD: continue home protonix  Neuropathy: continue home gabapentin    FEN/GI: Renal diet  VTE Prophylaxis: subcutaneous heparin   Disposition: Medsurg  History of Present Illness:  Louis Butler is a 68 y.o. male presenting with malaise, dizziness with ambulation. He's long term ESRD, transitioned from HD to PD in the last few months. Did not do peritoneal dialysis last night but otherwise is highly compliant. He has an indwelling foley that accidentally came out 1 week ago. This was painful but he denies any bleeding, abdominal pain or dysuria since then. He reports his UOP has been steadily declining, now has minimal UOP at baseline regardless of whether his foley is in but has had no UOP for the past week since the foley has been out. He reports he was given a course of outpatient antibiotics for UTI that he finished last week, can't remember what it was called. He denies any new sick symptoms or contacts, denies cough, fever, runny nose. He has not had a BM in 1 week.   He also has had increasing dizziness and weakness for about 2 weeks. He had previously been walking independently, but this has gotten progressively more challenging for him over an unclear period of time. He's gotten more and more dizzy to the point where he can no longer walk  across the room without grabbing onto something. He denies any falls, losses of consciousness, confusion, slurred speech, blurry vision, palpitations, chest pain or shortness of breath.   He also reports decreased appetite and poor PO intake for about a week. He's gone without all of his medications during this time per his choice, though he normally manages his own medications at home  with pill packs.   In the ED, he had stable vitals with occasional borderline hypotension. Lab workup notable for UA w/ leuks and bacteria, Na 126 K 3.0 GFR 4 AG 20, Trop 62, WBC 11.6, Hgb 9.5. CT AP showing continued b/l hydroureteronephrosis, unclear if this is worsened from prior. S/p 2g CTX.   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   Pertinent Past Surgical History: Transurethral resection of prostate 08/08/2019 Laparoscopic peritoneal catheter 12/09/23 Remainder reviewed in history tab.   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 Zoloft  100 mg daily Tamsulosin  0.4 mg daily Trimethoprim 100 mg daily  Objective: BP 121/60   Pulse 74   Temp (!) 97 F (36.1 C) (Oral)   Resp 14   Ht 6' 3 (1.905 m)   Wt 89.4 kg   SpO2 100%   BMI 24.62 kg/m  Exam: General: Awake, alert, NAD. Communicates clearly. Cardio: RRR. Normal S1, S2. No murmur, rub, gallop. 2+ radial and dorsalis pedis pulses b/l w/ good capillary refill. No LE edema.  Resp: CTA bilaterally. No wheezes, rales, or rhonchi. Normal work of breathing on room air Abdomen: soft, non-tender. No suprapubic ttp elicited. Mildly distended.  Neuro: AOx4. Cranial nerves II-XII intact. Bilateral UE and LE strength 5/5. Diminished hearing in the L ear.   Labs:  CBC BMET  Recent Labs  Lab 04/05/24 2032  WBC 11.6*  HGB 9.5*  HCT 28.8*  PLT 229   Recent  Labs  Lab 04/05/24 2032  NA 126*  K 3.0*  CL 84*  CO2 23  BUN 48*  CREATININE 13.80*  GLUCOSE 143*  CALCIUM  7.8*     Troponin 62 AST 14 ALT less than 5 alkaline phosphatase 89 total bilirubin 0.5 Magnesium  2.0 TSH 2.84 UA with turbid urine, moderate hemoglobin, negative ketones, negative nitrates, greater than 300 protein, moderate leuks and greater than 50 WBCs, many bacteria, 0-5 squamous epithelial cells Osmolality 283, urine osmolality 272, urine sodium 109  EKG: Sinus rhythm, poor R wave progression. 1st degree AV block.    Imaging Studies Performed: CT AP w/ Contrast  1. Moderate-to-severe bilateral hydroureteronephrosis with soft tissue stranding about the ureters, more pronounced on the left. 2. Marked bilateral renal cortical atrophy with likely benign proteinaceous cysts in the right kidney; no follow-up imaging recommended. 3. Cholelithiasis without evidence of cholecystitis.   CXR: 1. Interval removal of a right IJ central line. 2. No acute cardiopulmonary abnormality. My Interpretation: Possible increase in heart size vs prior. No acute CP or bony process appreciated.    Manon Jester, DO 04/06/2024, 1:32 PM PGY-1, [facility]  FPTS  Intern pager: [phone], text pages welcome Secure chat group [facility] Teaching Service   I was personally present and re-performed the exam and medical decision making and verified the service and findings are accurately documented in the note.  Gloriann Ogren, MD 04/06/2024 3:18 PM PGY-2, [facility]    "

## 2024-04-06 NOTE — Assessment & Plan Note (Addendum)
 Patient with long standing ESRD. Switch from HD to PD this fall, adherent at home. Na 126, K 3.0. UOP progressively decreased to now minimal with chronic indwelling foley 2/2 to retention.  -Admit to FMTS, medsurg, attending Dr Rosalynn -Nephro consulted, continue peritoneal dialysis per their recs  -Strict I/Os, watch UOP with foley in place -Continue home sevelamer - Palliative consult  -AM RFP

## 2024-04-06 NOTE — ED Notes (Signed)
 Patient without distress, reports no pain and can not tell or report a difference after urethral cath placed. Resting in position of comfort requesting ginger ale.

## 2024-04-06 NOTE — Plan of Care (Signed)
 FMTS Interim Progress Note  S: No current pain. No current dizziness or lightheadedness. Feels dizzy with movement. Intermittent vision blurriness, none current.   O: BP (!) 90/51 (BP Location: Right Arm)   Pulse 77   Temp 98.3 F (36.8 C)   Resp 18   Ht 6' 3 (1.905 m)   Wt 89.4 kg   SpO2 100%   BMI 24.62 kg/m    General: No acute distress. Resting comfortably in room. CV: S1/S2. No extra heart sounds. Warm and well-perfused. Pulm: Breathing comfortably on room air. No increased WOB. Abd: Soft, non-tender, non-distended. Skin:  Warm, dry. Ext: No significant extremity swelling.  Psych: Pleasant and appropriate.   A/P: ESRD on PD Chronic foley Foley in place with about 1L UOP charted today.   - Continue PD per nephro  - Cont home sevelamer - Strict I/O - AM RFP  FTT in adult Orthostatic hypotension Asymptomatic at this time.  - CTM vitals  - Cont ESRD treatment as above   Remainder of care plan per FMTS notes.   Diona Perkins, MD 04/06/2024, 8:04 PM PGY-2, [facility] Family Medicine Service pager [phone]

## 2024-04-06 NOTE — ED Notes (Signed)
"  Xray at bedside.   "

## 2024-04-06 NOTE — Consult Note (Addendum)
 [facility] Renal Consultation Note    Indication for Consultation:  Management of ESRD/hemodialysis, anemia, hypertension/volume, and secondary hyperparathyroidism.  HPI: Louis Butler is a 68 y.o. male with ESRD on CCPD nightly, HTN, T2DM, and Hx TURP with indwelling foley who is being admitted with fairly new weakness, poor appetite, and urinary obstruction/UTI.   Pt seen in room - a little squirrelly with story telling, but alert and oriented. Presented to ED via weakness with standing and hypotension. Accidentally pulled out his chronic foley ~1 week ago, hasn't been able to urinate much since. Not eating well over the past week as well and no BM during that time. He reports doing his PD nightly as usual. ED Intake vitals normal range. He was not hypoxic and afebrile. Labs with Na 126, K 3, CO2 23, BUN 48, Cr 13.8, Ca 7.8, Alb 3.3, TP 8.2, WBC 11.6, Hgb 9.5, Plts 229. CXR clear. CT abdomen with B hydronephrosis/hydroureter with fat standing (L>R). Foley placed with output - urine murky d/w UTI, sent for culture and started on Ceftriaxone . He was given 500mL NS bolus and K was supplemented.  At time of my visit, he was feeling a little better. No CP/dyspnea. No abd pain, N/V. As above, he reported no BM in ~1 week. The bandage on his PD cath site was loose - some erythema of exit site but no drainage noted.  Called his dialysis unit that coordinates his PD - spoke to his RN. She reports his PD has been going well to her knowledge, she does not have concerns that not being done correctly. She last saw him 2 weeks ago and he seemed well - arguing that this is all an acute issue.  Past Medical History:  Diagnosis Date   Arthritis    Cellulitis 03/14/2019   feet   CKD (chronic kidney disease)    Diabetes mellitus without complication (HCC)    Type II   End-stage kidney disease (HCC) 03/14/2019   was on hemodialysis ~ June 2021 - March 2022 when he discontinued; followed by Dr.  Gordy Blanch   GERD (gastroesophageal reflux disease)    Heart murmur    History of blood transfusion    Hyperkalemia 04/29/2023   Hypertension    12/30/19- having low blood pressure since beginning   Irregular heart beat    Urinary tract infection  Bilateral Hydronephrosis 05/03/2023   UTI (urinary tract infection)    Past Surgical History:  Procedure Laterality Date   arm surgery Right    was cut to the bone   BASCILIC VEIN TRANSPOSITION Left 11/18/2019   Procedure: FIRST STAGE LEFT ARM BASCILIC VEIN TRANSPOSITION;  Surgeon: Serene Gaile ORN, MD;  Location: [facility] OR;  Service: Vascular;  Laterality: Left;   BASCILIC VEIN TRANSPOSITION Left 01/04/2020   Procedure: LEFT SECOND STAGE BASCILIC VEIN TRANSPOSITION;  Surgeon: Serene Gaile ORN, MD;  Location: [facility] OR;  Service: Vascular;  Laterality: Left;   COLONOSCOPY  06/2019   DIRECT LARYNGOSCOPY Bilateral 08/06/2022   Procedure: DIRECT LARYNGOSCOPY WITH BIOPSY;  Surgeon: Carlie Clark, MD;  Location: [facility] OR;  Service: ENT;  Laterality: Bilateral;   EYE SURGERY Bilateral    cataract   NASOPHARYNGEAL BIOPSY Left 08/06/2022   Procedure: NASOPHARYNGEAL BIOPSY;  Surgeon: Carlie Clark, MD;  Location: [facility] OR;  Service: ENT;  Laterality: Left;   TONSILLECTOMY Left 08/06/2022   Procedure: TONSILLECTOMY;  Surgeon: Carlie Clark, MD;  Location: [facility] OR;  Service: ENT;  Laterality: Left;   TRANSURETHRAL RESECTION  OF PROSTATE N/A 08/08/2019   Procedure: TRANSURETHRAL RESECTION OF THE PROSTATE (TURP);  Surgeon: Carolee Sherwood JONETTA DOUGLAS, MD;  Location: [facility] ORS;  Service: Urology;  Laterality: N/A;   UPPER GASTROINTESTINAL ENDOSCOPY  06/2019   WISDOM TOOTH EXTRACTION     Family History  Problem Relation Age of Onset   Diabetes type II Mother    Lung cancer Mother    Hypertension Mother    Diabetes type II Father    Diabetes type II Sister    Diabetes type II Brother    Hypertension Brother    Other Son        burned   CAD Neg Hx    Colon cancer Neg Hx    Esophageal  cancer Neg Hx    Inflammatory bowel disease Neg Hx    Liver disease Neg Hx    Pancreatic cancer Neg Hx    Rectal cancer Neg Hx    Stomach cancer Neg Hx    Social History:  reports that he quit smoking about 18 months ago. His smoking use included cigarettes. He started smoking about 55 years ago. He has a 25.6 pack-year smoking history. He has never used smokeless tobacco. He reports that he does not currently use alcohol. He reports that he does not use drugs.  ROS: As per HPI otherwise negative.  Physical Exam: Vitals:   04/06/24 1115 04/06/24 1315 04/06/24 1333 04/06/24 1350  BP: (!) 110/59 121/60  (!) 107/56  Pulse: 76 74  72  Resp: 11 14    Temp:   97.7 F (36.5 C) 97.6 F (36.4 C)  TempSrc:   Oral Oral  SpO2: 100% 100%  100%  Weight:      Height:         General: Well developed, well nourished, in no acute distress. Room air Head: Normocephalic, atraumatic, sclera non-icteric, mucus membranes are moist. Neck: Supple without lymphadenopathy/masses. JVD not elevated. Lungs: Clear bilaterally to auscultation without wheezes, rales, or rhonchi. Breathing is unlabored. Heart: RRR with normal S1, S2. No murmurs, rubs, or gallops appreciated. Abdomen: Soft, non-tender, non-distended with normoactive bowel sounds. No rebound/guarding. PD cath in mid R abdomen, mild erythema to exit site - no drainage. GU: Foley in place, purulent appearing urine Musculoskeletal:  Strength and tone appear normal for age. Lower extremities: No edema or ischemic changes, no open wounds. Neuro: Alert and oriented X 3. Moves all extremities spontaneously. Psych:  Responds to questions appropriately with a normal affect. Dialysis Access: PD cath in mid R abdomen, LUE AVF +t/b  Allergies[1] Prior to Admission medications  Medication Sig Start Date End Date Taking? Authorizing Provider  acetaminophen  (TYLENOL ) 500 MG tablet Take 1,000 mg by mouth every 6 (six) hours as needed for headache (pain).     Yes [provider]  Cholecalciferol  (VITAMIN D3) 125 MCG (5000 UT) CAPS Take 5,000 Units by mouth See admin instructions. Take 5,000 units 2 times weekly.   Yes [provider]  esomeprazole  (NEXIUM ) 40 MG capsule Take 1 capsule (40 mg total) by mouth daily as needed (for heartburn). 05/11/23  Yes Romelle Booty, MD  famotidine  (PEPCID ) 20 MG tablet Take 1 tablet (20 mg total) by mouth daily. 12/01/23  Yes Donah Laymon PARAS, MD  gabapentin  (NEURONTIN ) 300 MG capsule Take 1 capsule (300 mg total) by mouth at bedtime. 12/03/23  Yes Donah Laymon PARAS, MD  hydrOXYzine  (ATARAX ) 25 MG tablet Take 25 mg by mouth daily as needed for itching. 07/10/23  Yes [provider]  polyethylene glycol (MIRALAX  / GLYCOLAX ) 17 g packet Take 17 g by mouth daily as needed for moderate constipation. 05/11/23  Yes Romelle Booty, MD  sertraline  (ZOLOFT ) 100 MG tablet Take 1 tablet by mouth every day 03/16/24  Yes McIntyre, Brittany J, MD  sevelamer carbonate (RENVELA) 800 MG tablet Take 2,400 mg by mouth 3 (three) times daily with meals. 09/10/23 09/09/24 Yes [provider]  tamsulosin  (FLOMAX ) 0.4 MG CAPS capsule Take 0.4 mg by mouth daily.   Yes [provider]  trimethoprim (TRIMPEX) 100 MG tablet Take 100 mg by mouth daily. 10/30/23  Yes [provider]  blood glucose meter kit and supplies KIT Dispense based on patient and insurance preference. Use up to four times daily as directed. (FOR ICD-9 250.00, 250.01). 03/28/19   Judeth Trenda BIRCH, MD  Insulin  Syringes, Disposable, U-100 0.5 ML MISC Use as per instructions 3 times daily AC. 03/28/19   Hongalgi, Anand D, MD  iron  sucrose (VENOFER ) Inject 110 mLs (200 mg total) into the vein every Monday, Wednesday, and Friday with hemodialysis. 06/12/23   Rosendo Norleen BROCKS, MD  midodrine  (PROAMATINE ) 5 MG tablet Take 5 mg by mouth. On HD days Patient not taking: Reported on 04/06/2024 07/30/23   [provider]  multivitamin  (RENA-VIT) TABS tablet Take 1 tablet by mouth at bedtime. 05/11/23   Romelle Booty, MD  sodium bicarbonate  650 MG tablet Take 650 mg by mouth 2 (two) times daily. Patient not taking: Reported on 04/06/2024 07/29/23   [provider]  VELTASSA  8.4 g packet Take 8.4 g by mouth daily. Patient not taking: Reported on 04/06/2024 10/02/23   [provider]   Current Facility-Administered Medications  Medication Dose Route Frequency Provider Last Rate Last Admin   acetaminophen  (TYLENOL ) tablet 1,000 mg  1,000 mg Oral Q6H PRN Manon Jester, DO       gabapentin  (NEURONTIN ) capsule 300 mg  300 mg Oral QHS Manon Jester, DO       heparin  injection 5,000 Units  5,000 Units Subcutaneous Q8H Manon Jester, DO       hydrOXYzine  (ATARAX ) tablet 25 mg  25 mg Oral Daily PRN Manon Jester, DO       pantoprazole  (PROTONIX ) EC tablet 40 mg  40 mg Oral Daily PRN Manon Jester, DO       potassium chloride  SA (KLOR-CON  M) CR tablet 20 mEq  20 mEq Oral Once Jerrye Katheryn BROCKS, MD       sertraline  (ZOLOFT ) tablet 100 mg  100 mg Oral Daily Manon Jester, DO       sevelamer carbonate (RENVELA) tablet 2,400 mg  2,400 mg Oral TID WC Manon Jester, DO       tamsulosin  (FLOMAX ) capsule 0.4 mg  0.4 mg Oral Daily Yanuck, Solomon, DO       trimethoprim (TRIMPEX) tablet 100 mg  100 mg Oral Daily Manon Jester, DO       Labs: Basic Metabolic Panel: Recent Labs  Lab 04/05/24 2032  NA 126*  K 3.0*  CL 84*  CO2 23  GLUCOSE 143*  BUN 48*  CREATININE 13.80*  CALCIUM  7.8*   Liver Function Tests: Recent Labs  Lab 04/05/24 2032  AST 14*  ALT <5  ALKPHOS 89  BILITOT 0.5  PROT 8.2*  ALBUMIN  3.3*   CBC: Recent Labs  Lab 04/05/24 2032  WBC 11.6*  HGB 9.5*  HCT 28.8*  MCV 99.0  PLT 229   CBG: Recent Labs  Lab 04/05/24  2039  GLUCAP 127*   Studies/Results: DG Chest Port 1 View Result Date: 04/06/2024 EXAM: 1 VIEW(S) XRAY OF THE CHEST 04/06/2024 10:16:17 AM COMPARISON: 04/30/2023  CLINICAL HISTORY: weakness FINDINGS: LINES, TUBES AND DEVICES: Right IJ central line removed. LUNGS AND PLEURA: No focal pulmonary opacity. No pleural effusion. No pneumothorax. HEART AND MEDIASTINUM: No acute abnormality of the cardiac and mediastinal silhouettes. BONES AND SOFT TISSUES: No acute osseous abnormality. IMPRESSION: 1. Interval removal of a right IJ central line. 2. No acute cardiopulmonary abnormality. Electronically signed by: Morgane Naveau MD 04/06/2024 11:07 AM EST RP Workstation: [id number]   CT ABDOMEN PELVIS W CONTRAST Result Date: 04/06/2024 EXAM: CT ABDOMEN AND PELVIS WITH CONTRAST 04/06/2024 10:03:21 AM TECHNIQUE: CT of the abdomen and pelvis was performed with the administration of 100 mL iohexol (OMNIPAQUE) 350 MG/ML injection. Multiplanar reformatted images are provided for review. Automated exposure control, iterative reconstruction, and/or weight-based adjustment of the mA/kV was utilized to reduce the radiation dose to as low as reasonably achievable. COMPARISON: CT of the abdomen and pelvis dated 04/30/2023. CLINICAL HISTORY: Abdominal pain, acute, nonlocalized; hx ESRD, dialysis. chronic foley came out last week, decreased urine output. ?abd pain. FINDINGS: LOWER CHEST: No acute abnormality. LIVER: The liver is unremarkable. GALLBLADDER AND BILE DUCTS: There are small stones lying independently within the gallbladder. There is no evidence of cholecystitis. No biliary ductal dilatation. SPLEEN: No acute abnormality. PANCREAS: No acute abnormality. ADRENAL GLANDS: No acute abnormality. KIDNEYS, URETERS AND BLADDER: Moderate-to-severe hydroureteronephrosis present bilaterally. Marked bilateral renal cortical atrophy. Proteinaceous cysts arising from the right kidney. Renovascular calcifications. Soft tissue stranding present about the ureters bilaterally, more pronounced on the left. No stones in the kidneys or ureters. Urinary bladder is unremarkable. GI AND BOWEL: Stomach  demonstrates no acute abnormality. There is no bowel obstruction. PERITONEUM AND RETROPERITONEUM: No ascites. No free air. There is a peritoneal dialysis catheter. VASCULATURE: The abdominal aorta is normal in caliber and demonstrates moderate calcific atheromatous disease. The mesenteric and renal arteries are patent. LYMPH NODES: No lymphadenopathy. REPRODUCTIVE ORGANS: No acute abnormality. BONES AND SOFT TISSUES: No acute osseous abnormality. No focal soft tissue abnormality. IMPRESSION: 1. Moderate-to-severe bilateral hydroureteronephrosis with soft tissue stranding about the ureters, more pronounced on the left. 2. Marked bilateral renal cortical atrophy with likely benign proteinaceous cysts in the right kidney; no follow-up imaging recommended. 3. Cholelithiasis without evidence of cholecystitis. Electronically signed by: Evalene Coho MD 04/06/2024 10:52 AM EST RP Workstation: [id number]   Dialysis Orders:  [facility] - [facility], Dr. Tobie is his primary nephrologist 5 exchanges, 2.5L fill volume, EDW 90kg - Mircera 225mcg sq monthly, last 12/17  Assessment/Plan:  UTI with urinary obstruction/hydronephrosis/presumed pyelo: Prior indwelling foley, accidentally pulled out last week - now has been replaced. Urine Cx pending. WBC high. On Ceftriaxone . Was also on trimethoprim, ?daily suppressive. Not ideal in ESRD patients - hold for now.  Hyponatremia: Doesn't appear overloaded. Given 0.5L IVF bolus on admit. Unclear if Urine Osm being low holds any weight in  light of urinary obstruction, follow lab trends.  ESRD:  On PD at home, will continue nightly - no edema on exam, plan 1.5% dextrose  only.  Hypokalemia: Mild, being replaced.  Hypotension/volume: BP low, does not appear on antihypertensives at home. Does not appear overloaded.   Anemia: Hgb 9.5 - last ESA dose ~2 weeks ago, follow trends, re-dose if drops.  Metabolic bone disease: CorrCa ok, follow.  Nutrition:  Alb low, adding  supplements.  Izetta Boehringer, PA-C 04/06/2024, 2:12 PM  [facility]    Seen and examined independently.  Agree with note and exam as documented above by physician extender and as noted here. Mr. Goetze is a 68 year old male with a history of end-stage renal disease on peritoneal dialysis who presented to the hospital with weakness and decreased appetite.  He also reported having accidentally pulled out his chronic Foley catheter when leaning over the bed at one point recently.  He told his wife that he did not want her to tell anyone.  She states that she is not willing to do anything like that again.  His Foley catheter was replaced.  He was found to have a urinary tract infection.  He has been started on antibiotics.  Nephrology is consulted for assistance with management of end-stage renal disease.    General elderly male in bed in no acute distress HEENT normocephalic atraumatic extraocular movements intact sclera anicteric Neck supple trachea midline Lungs clear to auscultation bilaterally normal work of breathing at rest  Heart S1S2 no rub Abdomen soft nontender nondistended; PD catheter in place Extremities no edema; no cyanosis or clubbing Psych normal mood and affect Neuro awake and conversant; provides hx which is supplemented by his wife; follows commands GU - foley in place   UTI  - abx per primary team  - would stop trimethoprim   Obstructive uropathy  - foley was replaced  Hyponatremia  - PD tonight  - would stop trimethoprim    ESRD on PD - resume PD tonight   Hypokalemia - repleted   Hypotension - no agents on board   Anemia of CKD - see above  - last ESA a couple of weeks ago and follow for need  Metabolic bone disease  - renal panel in AM  Thank you for the consult.  Please do not hesitate to contact me with any questions regarding our patient   Katheryn JAYSON Saba, MD 04/06/2024  6:52 PM      [1]  Allergies Allergen Reactions    Chlorhexidine  Gluconate Itching   Hydrocodone  Hives and Itching   Percocet [Oxycodone -Acetaminophen ] Hives and Itching

## 2024-04-06 NOTE — Assessment & Plan Note (Addendum)
 No BM last 1 week per patient - Miralax  daily

## 2024-04-06 NOTE — Assessment & Plan Note (Addendum)
 Progressive decline in function, appetite, PO intake. Significant orthostasis recently w/ hx of the same. Patient was previously on midodrine  with HD, but has not continued to take it after transition to PD.  - Encourage PO - RD consult  - Palliative consult as above  - PT/OT eval and treat

## 2024-04-06 NOTE — Assessment & Plan Note (Addendum)
 BPH: continue home tamsulosin  GERD: continue home protonix  Neuropathy: continue home gabapentin 

## 2024-04-06 NOTE — Plan of Care (Signed)
   Problem: Health Behavior/Discharge Planning: Goal: Ability to manage health-related needs will improve Outcome: Progressing   Problem: Clinical Measurements: Goal: Will remain free from infection Outcome: Progressing   Problem: Coping: Goal: Level of anxiety will decrease Outcome: Progressing

## 2024-04-06 NOTE — ED Notes (Signed)
 Floor aware aptient com ing iup

## 2024-04-06 NOTE — Telephone Encounter (Signed)
**  After Hours/ Emergency Line Call**  Received a page to call [phone]) - [phone].  Patient: Louis Butler  Caller: Wife of patient Confirmed name & DOB of patient with caller  Subjective:  Patient's wife calls after-hours line due to patient being in the ED waiting room for greater than 7 hours.  Came via ambulance due to hypotension.  Was placed in wheelchair and has been seen by triage nurse.  Objective:  Observations: N/A  Assessment & Plan  SHEROD CISSE is a 68 y.o. male with PMHx s/f ESRD, type 2 diabetes, hypertension, who calls with the following complaints and concerns: Long ED wait time.   Recommendations:  Discussed with patient's wife that I cannot speed up the emergency room.  Explained that he will be seen eventually and that I recommend he continue to be seen by someone in the ED. Patient's wife hung up before further discussion.  -- Will forward to PCP.  Ozell Provencal, MD, PGY-3 [facility] 3:22 AM 04/06/2024

## 2024-04-06 NOTE — Assessment & Plan Note (Signed)
 Patient with long standing ESRD. Switch from HD to PD this fall, adherent at home. Na 126, K 3.0. UOP progressively decreased to now minimal with chronic indwelling foley 2/2 to retention.  -Admit to FMTS, medsurg, attending Dr Rosalynn -Nephro consulted, continue peritoneal dialysis per their recs  -Strict I/Os, watch UOP with foley in place -Continue home sevelamer - Palliative consult  -AM RFP

## 2024-04-06 NOTE — Evaluation (Signed)
 Physical Therapy Evaluation Patient Details Name: Louis Butler MRN: [id number] DOB: 01-May-1955 Today's Date: 04/06/2024  History of Present Illness  Pt is a 68 y.o. M who presents 04/05/2024 with weakness, dizziness malaise. Significant PMH: ESRD on peritoneal dialysis, T2DM, HTN, CA external auditory canal and neck, MDD, diabetic neuropathy.  Clinical Impression  PTA, pt lives with his family in a ramped entrance house; he is typically independent with household ambulation but reports being largely sedentary. His spouse assists with PD management. Pt presents with decreased functional mobility secondary to orthostatic hypotension. Pt with drop from supine 108/57 (73), HR 65 to standing 68/42 (52), HR 98 and symptomatic. Deferred further ambulation due to low MAP and MD/RN notified. Otherwise, pt is able to navigate getting in and out of bed and standing to RW without physical assist. Recommend HHPT at d/c.      If plan is discharge home, recommend the following: A little help with walking and/or transfers;Assistance with cooking/housework;Assist for transportation;Help with stairs or ramp for entrance   Can travel by private vehicle        Equipment Recommendations None recommended by PT  Recommendations for Other Services       Functional Status Assessment Patient has had a recent decline in their functional status and demonstrates the ability to make significant improvements in function in a reasonable and predictable amount of time.     Precautions / Restrictions Precautions Precautions: Fall;Other (comment) Recall of Precautions/Restrictions: Intact Precaution/Restrictions Comments: Orthostatic Restrictions Weight Bearing Restrictions Per Provider Order: No      Mobility  Bed Mobility Overal bed mobility: Modified Independent                  Transfers Overall transfer level: Needs assistance Equipment used: Rolling walker (2 wheels) Transfers: Sit to/from  Stand Sit to Stand: Supervision           General transfer comment: Increased time to rise, no physical assist    Ambulation/Gait                  Stairs            Wheelchair Mobility     Tilt Bed    Modified Rankin (Stroke Patients Only)       Balance Overall balance assessment: Needs assistance Sitting-balance support: Feet supported Sitting balance-Leahy Scale: Good     Standing balance support: Bilateral upper extremity supported Standing balance-Leahy Scale: Fair                               Pertinent Vitals/Pain Pain Assessment Pain Assessment: No/denies pain    Home Living Family/patient expects to be discharged to:: Private residence Living Arrangements: Spouse/significant other;Other relatives (DIL, cousin) Available Help at Discharge: Family;Available 24 hours/day Type of Home: House Home Access: Ramped entrance       Home Layout: One level Home Equipment: Agricultural Consultant (2 wheels);Rollator (4 wheels);Cane - single point;Shower seat;Wheelchair - manual;BSC/3in1      Prior Function Prior Level of Function : Independent/Modified Independent;Driving             Mobility Comments: reports largely sedentary; watches TV during day       Extremity/Trunk Assessment   Upper Extremity Assessment Upper Extremity Assessment: Defer to OT evaluation    Lower Extremity Assessment Lower Extremity Assessment: Overall WFL for tasks assessed    Cervical / Trunk Assessment Cervical / Trunk Assessment: Other exceptions (forward head  posture)  Communication   Communication Communication: No apparent difficulties    Cognition Arousal: Alert Behavior During Therapy: WFL for tasks assessed/performed   PT - Cognitive impairments: No apparent impairments                         Following commands: Intact       Cueing Cueing Techniques: Verbal cues     General Comments      Exercises      Assessment/Plan    PT Assessment Patient needs continued PT services  PT Problem List Decreased activity tolerance;Decreased balance;Decreased mobility       PT Treatment Interventions DME instruction;Gait training;Functional mobility training;Therapeutic activities;Therapeutic exercise;Balance training;Patient/family education    PT Goals (Current goals can be found in the Care Plan section)  Acute Rehab PT Goals Patient Stated Goal: to walk without dizziness PT Goal Formulation: With patient Time For Goal Achievement: 04/20/24 Potential to Achieve Goals: Good    Frequency Min 2X/week     Co-evaluation               AM-PAC PT 6 Clicks Mobility  Outcome Measure Help needed turning from your back to your side while in a flat bed without using bedrails?: None Help needed moving from lying on your back to sitting on the side of a flat bed without using bedrails?: None Help needed moving to and from a bed to a chair (including a wheelchair)?: A Little Help needed standing up from a chair using your arms (e.g., wheelchair or bedside chair)?: A Little Help needed to walk in hospital room?: A Little Help needed climbing 3-5 steps with a railing? : A Lot 6 Click Score: 19    End of Session   Activity Tolerance: Other (comment) (limited due to orthostasis) Patient left: in bed;with call bell/phone within reach;with bed alarm set Nurse Communication: Mobility status;Other (comment) (BP) PT Visit Diagnosis: Difficulty in walking, not elsewhere classified (R26.2);Dizziness and giddiness (R42)    Time: 1600-1620 PT Time Calculation (min) (ACUTE ONLY): 20 min   Charges:   PT Evaluation $PT Eval Low Complexity: 1 Low   PT General Charges $$ ACUTE PT VISIT: 1 Visit         Louis Butler, PT, DPT Acute Rehabilitation Services Office [phone]   Louis Butler 04/06/2024, 3:43 PM

## 2024-04-06 NOTE — ED Provider Notes (Signed)
 " [place] EMERGENCY DEPARTMENT AT [facility] Provider Note   CSN: [id number] Arrival date & time: 04/05/24  8071     Patient presents with: Weakness and Hypotension   Louis Butler is a 68 y.o. male.   Pt with generalized weakness in the past 1-2 weeks. Hx ESRD on peritoneal dialysis. In past 1-2 weeks generally weak, with decreased appetite, taking very little by mouth, decreased urine output. Has chronic foley, but it fell out last week, not urinating since. ?abd pain/general. No vomiting or diarrhea. No dysuria. No chest pain or discomfort. No sob. No cough or uri symptoms. No fever or chills. Pt v limited historian - level 5 caveat.   The history is provided by the patient, medical records, the spouse, a relative and the EMS personnel. The history is limited by the condition of the patient.  Weakness Associated symptoms: no chest pain, no cough, no diarrhea, no dysuria, no fever, no headaches, no shortness of breath and no vomiting        Prior to Admission medications  Medication Sig Start Date End Date Taking? Authorizing Provider  acetaminophen  (TYLENOL ) 500 MG tablet Take 1,000 mg by mouth every 6 (six) hours as needed for headache (pain).     [provider]  blood glucose meter kit and supplies KIT Dispense based on patient and insurance preference. Use up to four times daily as directed. (FOR ICD-9 250.00, 250.01). 03/28/19   Hongalgi, Trenda BIRCH, MD  Cholecalciferol  (VITAMIN D3) 125 MCG (5000 UT) CAPS Take 5,000 Units by mouth daily.    [provider]  esomeprazole  (NEXIUM ) 40 MG capsule Take 1 capsule (40 mg total) by mouth daily as needed (for heartburn). 05/11/23   Romelle Booty, MD  famotidine  (PEPCID ) 20 MG tablet Take 1 tablet (20 mg total) by mouth daily. Patient taking differently: Take 20 mg by mouth daily as needed. 12/01/23   Donah Laymon PARAS, MD  gabapentin  (NEURONTIN ) 300 MG capsule Take 1 capsule (300 mg total) by mouth at  bedtime. 12/03/23   Donah Laymon PARAS, MD  hydrOXYzine  (ATARAX ) 25 MG tablet Take 25 mg by mouth daily as needed for itching. 07/10/23   [provider]  Insulin  Syringes, Disposable, U-100 0.5 ML MISC Use as per instructions 3 times daily AC. 03/28/19   Hongalgi, Anand D, MD  iron  sucrose (VENOFER ) Inject 110 mLs (200 mg total) into the vein every Monday, Wednesday, and Friday with hemodialysis. 06/12/23   Rosendo Norleen BROCKS, MD  loperamide  (IMODIUM  A-D) 2 MG tablet Take 1 tablet (2 mg total) by mouth 4 (four) times daily as needed for diarrhea or loose stools. 06/12/20   Donah Laymon PARAS, MD  midodrine  (PROAMATINE ) 5 MG tablet Take 5 mg by mouth. On HD days 07/30/23   [provider]  multivitamin (RENA-VIT) TABS tablet Take 1 tablet by mouth at bedtime. 05/11/23   Romelle Booty, MD  polyethylene glycol (MIRALAX  / GLYCOLAX ) 17 g packet Take 17 g by mouth daily as needed for moderate constipation. 05/11/23   Romelle Booty, MD  sertraline  (ZOLOFT ) 100 MG tablet Take 1 tablet by mouth every day 03/16/24   McIntyre, Brittany J, MD  sevelamer carbonate (RENVELA) 800 MG tablet Take 2,400 mg by mouth 3 (three) times daily with meals. 09/10/23 09/09/24  [provider]  sodium bicarbonate  650 MG tablet Take 650 mg by mouth 2 (two) times daily. 07/29/23   [provider]  tamsulosin  (FLOMAX ) 0.4 MG CAPS capsule Take 0.4  mg by mouth daily.    [provider]  trimethoprim (TRIMPEX) 100 MG tablet Take 100 mg by mouth daily. 10/30/23   [provider]  VELTASSA  8.4 g packet Take 8.4 g by mouth daily. 10/02/23   [provider]    Allergies: Chlorhexidine  gluconate, Hydrocodone , and Percocet [oxycodone -acetaminophen ]    Review of Systems  Constitutional:  Positive for appetite change. Negative for chills and fever.  HENT:  Negative for sore throat.   Respiratory:  Negative for cough and shortness of breath.   Cardiovascular:  Negative for chest pain and leg  swelling.  Gastrointestinal:  Negative for diarrhea and vomiting.  Genitourinary:  Positive for decreased urine volume. Negative for dysuria and flank pain.  Musculoskeletal:  Negative for back pain and neck pain.  Neurological:  Positive for weakness. Negative for speech difficulty, numbness and headaches.  Psychiatric/Behavioral:  Negative for confusion.     Updated Vital Signs BP (!) 110/56 (BP Location: Right Arm)   Pulse 76   Temp (!) 97 F (36.1 C) (Oral)   Resp 16   Ht 1.905 m (6' 3)   Wt 89.4 kg   SpO2 100%   BMI 24.62 kg/m   Physical Exam Vitals and nursing note reviewed.  Constitutional:      Appearance: Normal appearance. He is well-developed.  HENT:     Head: Atraumatic.     Nose: Nose normal.     Mouth/Throat:     Mouth: Mucous membranes are dry.     Pharynx: Oropharynx is clear.  Eyes:     General: No scleral icterus.    Conjunctiva/sclera: Conjunctivae normal.     Pupils: Pupils are equal, round, and reactive to light.  Neck:     Vascular: No carotid bruit.     Trachea: No tracheal deviation.     Comments: Trachea midline. Thyroid not grossly enlarged or tender. No neck stiffness or rigidity.  Cardiovascular:     Rate and Rhythm: Normal rate and regular rhythm.     Pulses: Normal pulses.     Heart sounds: Normal heart sounds. No murmur heard.    No friction rub. No gallop.  Pulmonary:     Effort: Pulmonary effort is normal. No accessory muscle usage or respiratory distress.     Breath sounds: Normal breath sounds.  Abdominal:     General: Bowel sounds are normal. There is no distension.     Palpations: Abdomen is soft.     Tenderness: There is abdominal tenderness. There is no guarding.     Comments: Suprapubic tenderness.   Genitourinary:    Comments: No cva tenderness. Normal external gu exam.  Musculoskeletal:        General: No swelling.     Cervical back: Normal range of motion and neck supple. No rigidity.  Skin:    General: Skin is warm  and dry.     Findings: No rash.  Neurological:     Mental Status: He is alert.     Comments: Alert, speech clear. Motor/sens grossly intact bil.   Psychiatric:        Mood and Affect: Mood normal.     (all labs ordered are listed, but only abnormal results are displayed) Results for orders placed or performed during the hospital encounter of 04/05/24  Comprehensive metabolic panel   Collection Time: 04/05/24  8:32 PM  Result Value Ref Range   Sodium 126 (L) 135 - 145 mmol/L   Potassium 3.0 (L) 3.5 - 5.1  mmol/L   Chloride 84 (L) 98 - 111 mmol/L   CO2 23 22 - 32 mmol/L   Glucose, Bld 143 (H) 70 - 99 mg/dL   BUN 48 (H) 8 - 23 mg/dL   Creatinine, Ser 86.19 (H) 0.61 - 1.24 mg/dL   Calcium  7.8 (L) 8.9 - 10.3 mg/dL   Total Protein 8.2 (H) 6.5 - 8.1 g/dL   Albumin  3.3 (L) 3.5 - 5.0 g/dL   AST 14 (L) 15 - 41 U/L   ALT <5 0 - 44 U/L   Alkaline Phosphatase 89 38 - 126 U/L   Total Bilirubin 0.5 0.0 - 1.2 mg/dL   GFR, Estimated 4 (L) >60 mL/min   Anion gap 20 (H) 5 - 15  CBC   Collection Time: 04/05/24  8:32 PM  Result Value Ref Range   WBC 11.6 (H) 4.0 - 10.5 K/uL   RBC 2.91 (L) 4.22 - 5.81 MIL/uL   Hemoglobin 9.5 (L) 13.0 - 17.0 g/dL   HCT 71.1 (L) 60.9 - 47.9 %   MCV 99.0 80.0 - 100.0 fL   MCH 32.6 26.0 - 34.0 pg   MCHC 33.0 30.0 - 36.0 g/dL   RDW 85.1 88.4 - 84.4 %   Platelets 229 150 - 400 K/uL   nRBC 0.0 0.0 - 0.2 %  CBG monitoring, ED   Collection Time: 04/05/24  8:39 PM  Result Value Ref Range   Glucose-Capillary 127 (H) 70 - 99 mg/dL   Comment 1 Notify RN    Comment 2 Document in Chart   Troponin T, High Sensitivity   Collection Time: 04/05/24  9:11 PM  Result Value Ref Range   Troponin T High Sensitivity 62 (H) 0 - 19 ng/L  Urinalysis, Routine w reflex microscopic -Urine, Catheterized; Indwelling urinary catheter   Collection Time: 04/06/24 10:17 AM  Result Value Ref Range   Color, Urine AMBER (A) YELLOW   APPearance TURBID (A) CLEAR   Specific Gravity, Urine  1.010 1.005 - 1.030   pH 7.0 5.0 - 8.0   Glucose, UA NEGATIVE NEGATIVE mg/dL   Hgb urine dipstick MODERATE (A) NEGATIVE   Bilirubin Urine NEGATIVE NEGATIVE   Ketones, ur NEGATIVE NEGATIVE mg/dL   Protein, ur >=699 (A) NEGATIVE mg/dL   Nitrite NEGATIVE NEGATIVE   Leukocytes,Ua MODERATE (A) NEGATIVE   RBC / HPF >50 0 - 5 RBC/hpf   WBC, UA >50 0 - 5 WBC/hpf   Bacteria, UA MANY (A) NONE SEEN   Squamous Epithelial / HPF 0-5 0 - 5 /HPF   Mucus PRESENT      EKG: EKG Interpretation Date/Time:  Wednesday April 06 2024 10:15:51 EST Ventricular Rate:  75 PR Interval:  224 QRS Duration:  107 QT Interval:  435 QTC Calculation: 486 R Axis:   -43  Text Interpretation: Sinus rhythm Prolonged PR interval Non-specific intra-ventricular conduction block Confirmed by Bernard Drivers ([id number]) on 04/06/2024 10:25:11 AM  Radiology: [facility] Chest Port 1 View Result Date: 04/06/2024 EXAM: 1 VIEW(S) XRAY OF THE CHEST 04/06/2024 10:16:17 AM COMPARISON: 04/30/2023 CLINICAL HISTORY: weakness FINDINGS: LINES, TUBES AND DEVICES: Right IJ central line removed. LUNGS AND PLEURA: No focal pulmonary opacity. No pleural effusion. No pneumothorax. HEART AND MEDIASTINUM: No acute abnormality of the cardiac and mediastinal silhouettes. BONES AND SOFT TISSUES: No acute osseous abnormality. IMPRESSION: 1. Interval removal of a right IJ central line. 2. No acute cardiopulmonary abnormality. Electronically signed by: Morgane Naveau MD 04/06/2024 11:07 AM EST RP Workstation: [id number]   CT ABDOMEN PELVIS  W CONTRAST Result Date: 04/06/2024 EXAM: CT ABDOMEN AND PELVIS WITH CONTRAST 04/06/2024 10:03:21 AM TECHNIQUE: CT of the abdomen and pelvis was performed with the administration of 100 mL iohexol (OMNIPAQUE) 350 MG/ML injection. Multiplanar reformatted images are provided for review. Automated exposure control, iterative reconstruction, and/or weight-based adjustment of the mA/kV was utilized to reduce the radiation dose to as  low as reasonably achievable. COMPARISON: CT of the abdomen and pelvis dated 04/30/2023. CLINICAL HISTORY: Abdominal pain, acute, nonlocalized; hx ESRD, dialysis. chronic foley came out last week, decreased urine output. ?abd pain. FINDINGS: LOWER CHEST: No acute abnormality. LIVER: The liver is unremarkable. GALLBLADDER AND BILE DUCTS: There are small stones lying independently within the gallbladder. There is no evidence of cholecystitis. No biliary ductal dilatation. SPLEEN: No acute abnormality. PANCREAS: No acute abnormality. ADRENAL GLANDS: No acute abnormality. KIDNEYS, URETERS AND BLADDER: Moderate-to-severe hydroureteronephrosis present bilaterally. Marked bilateral renal cortical atrophy. Proteinaceous cysts arising from the right kidney. Renovascular calcifications. Soft tissue stranding present about the ureters bilaterally, more pronounced on the left. No stones in the kidneys or ureters. Urinary bladder is unremarkable. GI AND BOWEL: Stomach demonstrates no acute abnormality. There is no bowel obstruction. PERITONEUM AND RETROPERITONEUM: No ascites. No free air. There is a peritoneal dialysis catheter. VASCULATURE: The abdominal aorta is normal in caliber and demonstrates moderate calcific atheromatous disease. The mesenteric and renal arteries are patent. LYMPH NODES: No lymphadenopathy. REPRODUCTIVE ORGANS: No acute abnormality. BONES AND SOFT TISSUES: No acute osseous abnormality. No focal soft tissue abnormality. IMPRESSION: 1. Moderate-to-severe bilateral hydroureteronephrosis with soft tissue stranding about the ureters, more pronounced on the left. 2. Marked bilateral renal cortical atrophy with likely benign proteinaceous cysts in the right kidney; no follow-up imaging recommended. 3. Cholelithiasis without evidence of cholecystitis. Electronically signed by: Evalene Coho MD 04/06/2024 10:52 AM EST RP Workstation: [id number]     Procedures   Medications Ordered in the ED  cefTRIAXone   (ROCEPHIN ) 2 g in sodium chloride  0.9 % 100 mL IVPB (has no administration in time range)  sodium chloride  0.9 % bolus 500 mL (0 mLs Intravenous Stopped 04/06/24 1057)  iohexol (OMNIPAQUE) 350 MG/ML injection 100 mL (100 mLs Intravenous Contrast Given 04/06/24 0955)                                    Medical Decision Making Problems Addressed: Acute UTI: acute illness or injury with systemic symptoms that poses a threat to life or bodily functions Bilateral hydronephrosis: chronic illness or injury Dehydration: acute illness or injury with systemic symptoms that poses a threat to life or bodily functions ESRD on peritoneal dialysis [facility]): chronic illness or injury that poses a threat to life or bodily functions Failure to thrive in adult: chronic illness or injury Generalized weakness: acute illness or injury with systemic symptoms that poses a threat to life or bodily functions Hypokalemia: acute illness or injury Hyponatremia: acute illness or injury  Amount and/or Complexity of Data Reviewed Independent Historian:     Details: Family, hx Labs: ordered. Decision-making details documented in ED Course. Radiology: ordered and independent interpretation performed. Decision-making details documented in ED Course. ECG/medicine tests: ordered and independent interpretation performed. Decision-making details documented in ED Course. Discussion of management or test interpretation with external provider(s): Family medicine.   Risk Prescription drug management. Decision regarding hospitalization.   Iv ns. Continuous pulse ox and cardiac monitoring. Labs ordered/sent. Imaging ordered.   Differential diagnosis includes dehydration,  uti, anemia, etc. Dispo decision including potential need for admission considered - will get labs and imaging and reassess.   Reviewed nursing notes and prior charts for additional history. External reports reviewed. Additional history from: family, ems.    Cardiac monitor: sinus rhythm, rate 74.  Labs reviewed/interpreted by me - na low 126.  K mildly low. Bun/cr increased from prior - hx esrd/pd. Wbc 11, hgb 9.5 c/w basaeline. Trop mildly elevated. No chest pain.  UA c/w uti. Culture sent. Iv abx.   Xrays reviewed/interpreted by me - no pna.   CT reviewed/interpreted by me - bil hydronephrosis (also noted on prior imaging).  NS bolus.  Recheck pt, no current abd pain or tenderness on exam.  Wbc normal, no fevers. Does have uti on labs.   Given weakness, hyponatremia, uti, etc, will admit.   FP roc consulted for admission.        Final diagnoses:  Generalized weakness  Hyponatremia  Dehydration  Acute UTI  Bilateral hydronephrosis  ESRD on peritoneal dialysis [facility])  Failure to thrive in adult    ED Discharge Orders     None          Bernard Drivers, MD 04/06/24 1122  "

## 2024-04-06 NOTE — Progress Notes (Signed)
 Pt receives out-pt PD care through [facility] home therapy dept. Will assist as needed.   Randine Mungo Dialysis Navigator [phone]

## 2024-04-06 NOTE — Hospital Course (Addendum)
 Louis Butler is a 68 y.o.male with a history of ESRD, T2DM who was admitted to the family medicine teaching Service at [facility] for orthostasis and failure to thrive. His hospital course is detailed below:  Orthostasis - FTT Patient admitted for progressive orthostasis, poor PO and anorexia, acutely worsening x 1 week.  Patient has a prior history of the same.  Patient denied any falls at home and there was a low concern for cardiac or neurological etiology of his dizziness. PT, OT, RD and palliative were consulted. PO intake during admission improved, ok to continue regular diet from a nephrology standpoint. Patient declined oral nutritional supplement . Change in functional status improved by discharge. PT recommended [facility] but patient was denied as he is on peritoneal dialysis. Patient lives with his daughter-in-law who works in physical therapy and he would prefer for her to do all home therapy so he declined home health at discharge. Patient was restarted on low dose midodrine  5mg  TID while admitted to help with symptoms.   L Hydronephrosis/ Obstructive uropathy I ESRD Patient on daily peritoneal dialysis after transition from HD this fall.  He has a chronic indwelling Foley that had accidentally fallen out 1 week ago.  Nephrology was consulted and they arranged for nightly PD. Foley was replaced. Patient was initially started on ceftriaxone , but urine culture showed no growth so was discontinued.  Urine output after placement of Foley was 1 L on day one with appropriate daily output thereafter.   Irregular heart rhythm  1/2 patient had an EKG with irregular rhythm. Has history of irregular rhythm in the past but no known Afib and is not on anticoagulation. This resolved spontaneously. Pt was monitored on continuous telemetry.  Nausea  Patient started having nausea and intermittent vomiting starting 1/1. He was managed with antiemetics. Thought to be related to his PD. He had evidence of gallstones  on initial CT abdomen pelvis on admission. RUQ showed cholelithiasis but no cholecystitis, right hydronephrosis similar to previous CT scan. He did not have RUQ pain. He had regular bowel movements. Binder was switched from sevalmer to auryxia  to help with nausea. Discharged with phoslo  due to cost issues. Reglan  was scheduled twice a day starting 1/5 with relief of nausea. Patient was stable and able to tolerate PD without n/v on discharge. Given persistent nausea and vomiting with headache, obtained head CT showing concern for meningioma. MRI brain was obtained which showed right middle cranial fossa mass is approximately 19 mm and does appears to be extra-axial on this non-contrast exam. No cerebral edema. Favor Meningioma. Mild chronic small vessel disease and left mastoidectomy. Recommended follow up MRI.   Other chronic conditions were medically managed with home medications and formulary alternatives as necessary (constipation, GERD, MDD)  PCP Follow-up Recommendations: Outpatient Palliative Care follow up.  Recommend outpatient Neurosurgery referral for meningioma.  Recommend follow up MRI Brain W WO contrast for staging and treatment planning.  Ensure follow up with Nephrology

## 2024-04-07 DIAGNOSIS — Z992 Dependence on renal dialysis: Secondary | ICD-10-CM | POA: Diagnosis not present

## 2024-04-07 DIAGNOSIS — N186 End stage renal disease: Secondary | ICD-10-CM | POA: Diagnosis not present

## 2024-04-07 LAB — RENAL FUNCTION PANEL
Albumin: 2.8 g/dL — ABNORMAL LOW (ref 3.5–5.0)
Anion gap: 18 — ABNORMAL HIGH (ref 5–15)
BUN: 51 mg/dL — ABNORMAL HIGH (ref 8–23)
CO2: 20 mmol/L — ABNORMAL LOW (ref 22–32)
Calcium: 6.9 mg/dL — ABNORMAL LOW (ref 8.9–10.3)
Chloride: 86 mmol/L — ABNORMAL LOW (ref 98–111)
Creatinine, Ser: 13.5 mg/dL — ABNORMAL HIGH (ref 0.61–1.24)
GFR, Estimated: 4 mL/min — ABNORMAL LOW
Glucose, Bld: 196 mg/dL — ABNORMAL HIGH (ref 70–99)
Phosphorus: 7.4 mg/dL — ABNORMAL HIGH (ref 2.5–4.6)
Potassium: 2.9 mmol/L — ABNORMAL LOW (ref 3.5–5.1)
Sodium: 124 mmol/L — ABNORMAL LOW (ref 135–145)

## 2024-04-07 LAB — URINE CULTURE: Culture: NO GROWTH

## 2024-04-07 LAB — CBC
HCT: 22.6 % — ABNORMAL LOW (ref 39.0–52.0)
Hemoglobin: 7.7 g/dL — ABNORMAL LOW (ref 13.0–17.0)
MCH: 32.9 pg (ref 26.0–34.0)
MCHC: 34.1 g/dL (ref 30.0–36.0)
MCV: 96.6 fL (ref 80.0–100.0)
Platelets: 185 K/uL (ref 150–400)
RBC: 2.34 MIL/uL — ABNORMAL LOW (ref 4.22–5.81)
RDW: 15 % (ref 11.5–15.5)
WBC: 7.4 K/uL (ref 4.0–10.5)
nRBC: 0 % (ref 0.0–0.2)

## 2024-04-07 MED ORDER — POTASSIUM CHLORIDE CRYS ER 20 MEQ PO TBCR
40.0000 meq | EXTENDED_RELEASE_TABLET | Freq: Once | ORAL | Status: AC
  Administered 2024-04-07: 40 meq via ORAL
  Filled 2024-04-07: qty 2

## 2024-04-07 MED ORDER — DULOXETINE HCL 30 MG PO CPEP
30.0000 mg | ORAL_CAPSULE | Freq: Every day | ORAL | Status: DC
  Administered 2024-04-07 – 2024-04-12 (×6): 30 mg via ORAL
  Filled 2024-04-07 (×6): qty 1

## 2024-04-07 MED ORDER — RENA-VITE PO TABS
1.0000 | ORAL_TABLET | Freq: Every day | ORAL | Status: DC
  Administered 2024-04-07 – 2024-04-17 (×11): 1 via ORAL
  Filled 2024-04-07 (×11): qty 1

## 2024-04-07 MED ORDER — THIAMINE MONONITRATE 100 MG PO TABS
100.0000 mg | ORAL_TABLET | Freq: Every day | ORAL | Status: AC
  Administered 2024-04-07 – 2024-04-13 (×7): 100 mg via ORAL
  Filled 2024-04-07 (×7): qty 1

## 2024-04-07 MED ORDER — ONDANSETRON 4 MG PO TBDP
4.0000 mg | ORAL_TABLET | Freq: Four times a day (QID) | ORAL | Status: DC | PRN
  Administered 2024-04-07 – 2024-04-11 (×4): 4 mg via ORAL
  Filled 2024-04-07 (×5): qty 1

## 2024-04-07 NOTE — Assessment & Plan Note (Addendum)
 Hx TURP, BPH: Continue home tamsulosin  GERD: Continue home Protonix  Neuropathy: Increase gabapentin  to 300 BID. Holding home Cymbalta while on Zoloft .  Mood: Continue home Zoloft  100 daily

## 2024-04-07 NOTE — Evaluation (Signed)
 Occupational Therapy Evaluation Patient Details Name: Louis Butler MRN: [id number] DOB: Jul 01, 1955 Today's Date: 04/07/2024   History of Present Illness   Pt is a 69 y.o. M who presents 04/05/2024 with weakness, dizziness malaise. Significant PMH: ESRD on peritoneal dialysis, T2DM, HTN, CA external auditory canal and neck, MDD, diabetic neuropathy.     Clinical Impressions Pt seen for limited OT eval this PM. PTA, he reports indep with BADL and mobility without AD. Uses rollator for longer distances. Reports mostly sedentary at home. Session limited by pt's increasing irritability towards OT and his general care at the hospital. Reports you can tell the doctors that come in here next that they can kiss my ass. Reports several complaints, that he doesn't want to be seen by therapy until he has been here for at least a week, despite OT educating pt that this is unrealistic and will further detriment his health/weakness. Pt unable to reason with therapist and requesting OT leave.   Note soft BP with mobility just to EOB (94/55). Pt demo's sufficient strength x4 extremities in anticipation of further ADLs/mobility, suspect he will be limited by orthostatic BP. He reports he often needs to sit down quickly at home to avoid passing out.  Pt is currently functioning below baseline and would benefit from ongoing acute OT services to progress towards safe discharge and to facilitate return to prior level of function. Do not anticipate any further OT follow-up, however will provide final d/c recs pending further assessment as pt is cooperative and willing.     If plan is discharge home, recommend the following:   A little help with walking and/or transfers;A little help with bathing/dressing/bathroom;Assistance with cooking/housework;Assist for transportation;Help with stairs or ramp for entrance     Functional Status Assessment   Patient has had a recent decline in their functional status and  demonstrates the ability to make significant improvements in function in a reasonable and predictable amount of time.     Equipment Recommendations   None recommended by OT     Recommendations for Other Services         Precautions/Restrictions   Precautions Precautions: Fall;Other (comment) Recall of Precautions/Restrictions: Impaired Precaution/Restrictions Comments: orthostatic Restrictions Weight Bearing Restrictions Per Provider Order: No     Mobility Bed Mobility Overal bed mobility: Modified Independent             General bed mobility comments: HOB elevated, + rails    Transfers                   General transfer comment: not assessed - pt becoming increasingly argumentative and irritated and session ceased prematurely      Balance Overall balance assessment: Needs assistance Sitting-balance support: Feet supported, No upper extremity supported Sitting balance-Leahy Scale: Normal                                     ADL either performed or assessed with clinical judgement   ADL Overall ADL's : Needs assistance/impaired Eating/Feeding: Independent   Grooming: Set up;Sitting   Upper Body Bathing: Independent   Lower Body Bathing: Set up;Sitting/lateral leans Lower Body Bathing Details (indicate cue type and reason): donned B sneakers with velcro straps via forward lean method Upper Body Dressing : Independent   Lower Body Dressing: Set up;Sitting/lateral leans  Vision         Perception         Praxis         Pertinent Vitals/Pain Pain Assessment Pain Assessment: No/denies pain     Extremity/Trunk Assessment Upper Extremity Assessment Upper Extremity Assessment: Overall WFL for tasks assessed   Lower Extremity Assessment Lower Extremity Assessment: Defer to PT evaluation       Communication Communication Communication: No apparent difficulties (HoH)   Cognition  Arousal: Alert Behavior During Therapy: WFL for tasks assessed/performed Cognition: Cognition impaired   Orientation impairments:  (AOX4)   Memory impairment (select all impairments): Short-term memory   Executive functioning impairment (select all impairments): Reasoning OT - Cognition Comments: poor rational reasoning and insight, argumentative                 Following commands: Intact       Cueing  General Comments   Cueing Techniques: Verbal cues  BP supine: 103/63 (76); BP seated EOB: 94/55 (68) - pt transiently dizzy   Exercises     Shoulder Instructions      Home Living Family/patient expects to be discharged to:: Private residence Living Arrangements: Spouse/significant other;Other relatives (DIL, cousin) Available Help at Discharge: Family;Available 24 hours/day Type of Home: House Home Access: Ramped entrance     Home Layout: One level     Bathroom Shower/Tub: Chief Strategy Officer: Standard     Home Equipment: Agricultural Consultant (2 wheels);Rollator (4 wheels);Cane - single point;Shower seat;Wheelchair - manual;BSC/3in1          Prior Functioning/Environment Prior Level of Function : Independent/Modified Independent;Driving             Mobility Comments: reports largely sedentary; watches TV during day ADLs Comments: indep with BADLs    OT Problem List: Decreased strength;Decreased activity tolerance;Impaired balance (sitting and/or standing);Decreased safety awareness   OT Treatment/Interventions: Self-care/ADL training;Energy conservation;DME and/or AE instruction;Therapeutic activities;Patient/family education;Balance training      OT Goals(Current goals can be found in the care plan section)   Acute Rehab OT Goals Patient Stated Goal: get the hell out of here OT Goal Formulation: With patient Time For Goal Achievement: 04/21/24 Potential to Achieve Goals: Fair   OT Frequency:  Min 1X/week    Co-evaluation               AM-PAC OT 6 Clicks Daily Activity     Outcome Measure Help from another person eating meals?: None Help from another person taking care of personal grooming?: A Little Help from another person toileting, which includes using toliet, bedpan, or urinal?: A Little Help from another person bathing (including washing, rinsing, drying)?: A Little Help from another person to put on and taking off regular upper body clothing?: None Help from another person to put on and taking off regular lower body clothing?: A Little 6 Click Score: 20   End of Session Nurse Communication: Mobility status;Other (comment) (nausea meds)  Activity Tolerance: Treatment limited secondary to agitation (irritability) Patient left: in bed;with call bell/phone within reach;with bed alarm set  OT Visit Diagnosis: Unsteadiness on feet (R26.81);Other abnormalities of gait and mobility (R26.89);Muscle weakness (generalized) (M62.81)                Time: 8548-8491 OT Time Calculation (min): 17 min Charges:  OT General Charges $OT Visit: 1 Visit OT Evaluation $OT Eval Low Complexity: 1 Low  Valor Quaintance M. [place], OTR/L [facility] Acute Rehabilitation Services [phone] Secure Chat Preferred  Margery Szostak  Lakoda Raske 04/07/2024, 4:43 PM

## 2024-04-07 NOTE — Progress Notes (Signed)
 " [facility] Progress Note   Subjective:   Seen in room, coming off PD. Reports he is hungry, requests regular diet. Reports neuropathy in his feet. Denies SOB, CP, dizziness, nausea.   Objective Vitals:   04/06/24 1646 04/06/24 1959 04/06/24 2154 04/07/24 0434  BP: (!) 107/48 (!) 90/51 (!) 102/54 (!) 100/51  Pulse: 65 77 79 76  Resp: 18 18 18 18   Temp: 97.9 F (36.6 C) 98.3 F (36.8 C) 97.7 F (36.5 C) 98.4 F (36.9 C)  TempSrc: Oral     SpO2: 100% 100% 97% 96%  Weight:      Height:       Physical Exam General: Alert male In NAD Lungs: CTA bilaterally, respirations unlabored Abdomen: soft, non-distended Extremities: no edema b/l lower extremities Dialysis Access:  PD cath in lower abdomen, no surrounding redness/drainage  Additional Objective Labs: Basic Metabolic Panel: Recent Labs  Lab 04/05/24 2032  NA 126*  K 3.0*  CL 84*  CO2 23  GLUCOSE 143*  BUN 48*  CREATININE 13.80*  CALCIUM  7.8*   Liver Function Tests: Recent Labs  Lab 04/05/24 2032  AST 14*  ALT <5  ALKPHOS 89  BILITOT 0.5  PROT 8.2*  ALBUMIN  3.3*   No results for input(s): LIPASE, AMYLASE in the last 168 hours. CBC: Recent Labs  Lab 04/05/24 2032  WBC 11.6*  HGB 9.5*  HCT 28.8*  MCV 99.0  PLT 229   Blood Culture    Component Value Date/Time   SDES URINE, CLEAN CATCH 04/30/2023 1729   SPECREQUEST  04/30/2023 1729    NONE Performed at [facility], [address], [place], [place] [address]    CULT >=100,000 COLONIES/mL PSEUDOMONAS AERUGINOSA (A) 04/30/2023 1729   REPTSTATUS 05/03/2023 FINAL 04/30/2023 1729    Cardiac Enzymes: No results for input(s): CKTOTAL, CKMB, CKMBINDEX, TROPONINI in the last 168 hours. CBG: Recent Labs  Lab 04/05/24 2039  GLUCAP 127*   Iron  Studies: No results for input(s): IRON , TIBC, TRANSFERRIN, FERRITIN in the last 72 hours. @lablastinr3 @ Studies/Results: DG Chest Port 1 View Result Date:  04/06/2024 EXAM: 1 VIEW(S) XRAY OF THE CHEST 04/06/2024 10:16:17 AM COMPARISON: 04/30/2023 CLINICAL HISTORY: weakness FINDINGS: LINES, TUBES AND DEVICES: Right IJ central line removed. LUNGS AND PLEURA: No focal pulmonary opacity. No pleural effusion. No pneumothorax. HEART AND MEDIASTINUM: No acute abnormality of the cardiac and mediastinal silhouettes. BONES AND SOFT TISSUES: No acute osseous abnormality. IMPRESSION: 1. Interval removal of a right IJ central line. 2. No acute cardiopulmonary abnormality. Electronically signed by: Morgane Naveau MD 04/06/2024 11:07 AM EST RP Workstation: [id number]   CT ABDOMEN PELVIS W CONTRAST Result Date: 04/06/2024 EXAM: CT ABDOMEN AND PELVIS WITH CONTRAST 04/06/2024 10:03:21 AM TECHNIQUE: CT of the abdomen and pelvis was performed with the administration of 100 mL iohexol (OMNIPAQUE) 350 MG/ML injection. Multiplanar reformatted images are provided for review. Automated exposure control, iterative reconstruction, and/or weight-based adjustment of the mA/kV was utilized to reduce the radiation dose to as low as reasonably achievable. COMPARISON: CT of the abdomen and pelvis dated 04/30/2023. CLINICAL HISTORY: Abdominal pain, acute, nonlocalized; hx ESRD, dialysis. chronic foley came out last week, decreased urine output. ?abd pain. FINDINGS: LOWER CHEST: No acute abnormality. LIVER: The liver is unremarkable. GALLBLADDER AND BILE DUCTS: There are small stones lying independently within the gallbladder. There is no evidence of cholecystitis. No biliary ductal dilatation. SPLEEN: No acute abnormality. PANCREAS: No acute abnormality. ADRENAL GLANDS: No acute abnormality. KIDNEYS, URETERS AND BLADDER: Moderate-to-severe hydroureteronephrosis  present bilaterally. Marked bilateral renal cortical atrophy. Proteinaceous cysts arising from the right kidney. Renovascular calcifications. Soft tissue stranding present about the ureters bilaterally, more pronounced on the left. No  stones in the kidneys or ureters. Urinary bladder is unremarkable. GI AND BOWEL: Stomach demonstrates no acute abnormality. There is no bowel obstruction. PERITONEUM AND RETROPERITONEUM: No ascites. No free air. There is a peritoneal dialysis catheter. VASCULATURE: The abdominal aorta is normal in caliber and demonstrates moderate calcific atheromatous disease. The mesenteric and renal arteries are patent. LYMPH NODES: No lymphadenopathy. REPRODUCTIVE ORGANS: No acute abnormality. BONES AND SOFT TISSUES: No acute osseous abnormality. No focal soft tissue abnormality. IMPRESSION: 1. Moderate-to-severe bilateral hydroureteronephrosis with soft tissue stranding about the ureters, more pronounced on the left. 2. Marked bilateral renal cortical atrophy with likely benign proteinaceous cysts in the right kidney; no follow-up imaging recommended. 3. Cholelithiasis without evidence of cholecystitis. Electronically signed by: Timothy Berrigan MD 04/06/2024 10:52 AM EST RP Workstation: [id number]   Medications:  dialysis solution 1.5% low-MG/low-CA      gabapentin   300 mg Oral QHS   gentamicin cream  1 Application Topical Daily   heparin   5,000 Units Subcutaneous Q8H   polyethylene glycol  17 g Oral Daily   sertraline   100 mg Oral Daily   sevelamer carbonate  2,400 mg Oral TID WC   tamsulosin   0.4 mg Oral Daily    Dialysis Orders: [facility] - [facility], Dr. Tobie is his primary nephrologist 5 exchanges, 2.5L fill volume, EDW 90kg - Mircera 225mcg sq monthly, last 12/17  Assessment/Plan:  UTI with urinary obstruction/hydronephrosis/presumed pyelo: Prior indwelling foley, accidentally pulled out last week - now has been replaced. Initially on abx, have now been stopped.   Hyponatremia: Doesn't appear overloaded. Given 0.5L IVF bolus on admit. Unclear if Urine Osm being low holds any weight in  light of urinary obstruction, follow lab trends- AM labs are pending  ESRD:  On PD at home, will continue nightly - no edema  on exam, plan 1.5% dextrose  only.  Hypokalemia: S/p supplementation, repeat labs pending. OK for regular diet for now, will follow electrolytes closely.   Hypotension/volume: BP low on admission, does not appear on antihypertensives at home and was previously on midodrine . Improved today, possibly due to poor PO intake, restart midodrine  as needed.   Anemia: Hgb 9.5 - last ESA dose ~2 weeks ago, follow trends, re-dose if drops.  Metabolic bone disease: CorrCa ok, follow.  Nutrition:  Alb low, adding supplements.    Lucie Collet, PA-C 04/07/2024, 8:18 AM  [facility] Pager: [phone]   "

## 2024-04-07 NOTE — Assessment & Plan Note (Addendum)
 LBM this morning.  - Continue daily Miralax 

## 2024-04-07 NOTE — Progress Notes (Addendum)
 Initial Nutrition Assessment  DOCUMENTATION CODES:  Not applicable  INTERVENTION:  Patient refused ONS. Renal multivitamin PO once daily. 100 mg thiamine  PO once daily for 7 days. Continue regular diet.  NUTRITION DIAGNOSIS:  Inadequate oral intake related to acute illness (feeling unwell after Foley was dislodged) as evidenced by energy intake < or equal to 50% for > or equal to 5 days   GOAL:  Patient will meet greater than or equal to 90% of their needs   MONITOR:  PO intake, Labs, Weight trends, I & O's  REASON FOR ASSESSMENT:  Consult Assessment of nutrition requirement/status  ASSESSMENT:  Patient presented with malaise, dizziness and dislodging of indwelling Foley and was found to have UTI, failure to thrive, orthostatic hypotension and constipation. PMH significant for DM2, ESRD previously on HD transitioned to PD fall of 2025 due to difficulty with HD adherence.  This morning, the patient insisted on a regular diet and reported to Nephrologist that he will eat what he wants regardless of the diet restriction. He is now on a regular diet. Spoke with the patient who states that he basically ate nothing for about two weeks PTA because he was not feeling well since his Foley came out. He was so weak he had to hold on to things to ambulate. He did not have a BM for a week PTA but that has since resolved with a normal BM this morning. He reports a UBW of 197-200 lbs, which has been stable. The last time he weighed that was last week. He tells me he only eats one meal a day because he does not want to have to fix more than one meal a day. It typically consists of fish or chicken with vegetables. He does not drink ONS and does not want them stating he hates the taste. He states he has a good appetite currently and as long as he is allowed to eat without a restrictive diet, he will eat well. He questions the need for a renal diet and I briefly explained the need for it to him. He was not  aware his diet had already been changed to regular and he said to tell them I appreciate it.  Scheduled Meds:  gabapentin   300 mg Oral QHS   gentamicin cream  1 Application Topical Daily   heparin   5,000 Units Subcutaneous Q8H   polyethylene glycol  17 g Oral Daily   potassium chloride   40 mEq Oral Once   sertraline   100 mg Oral Daily   sevelamer carbonate  2,400 mg Oral TID WC   tamsulosin   0.4 mg Oral Daily   Continuous Infusions:  dialysis solution 1.5% low-MG/low-CA     PRN Meds:.acetaminophen , hydrOXYzine , pantoprazole   Diet Order             Diet regular Room service appropriate? Yes; Fluid consistency: Thin  Diet effective now                  Meal Intake: 100%  Labs:     Latest Ref Rng & Units 04/07/2024    9:37 AM 04/05/2024    8:32 PM 07/10/2023    3:30 AM  CMP  Glucose 70 - 99 mg/dL 803  856  888   BUN 8 - 23 mg/dL 51  48  52   Creatinine 0.61 - 1.24 mg/dL 86.49  86.19  5.06   Sodium 135 - 145 mmol/L 124  126  136   Potassium 3.5 - 5.1 mmol/L 2.9  3.0  3.9   Chloride 98 - 111 mmol/L 86  84  101   CO2 22 - 32 mmol/L 20  23  24    Calcium  8.9 - 10.3 mg/dL 6.9  7.8  9.1   Total Protein 6.5 - 8.1 g/dL  8.2    Total Bilirubin 0.0 - 1.2 mg/dL  0.5    Alkaline Phos 38 - 126 U/L  89    AST 15 - 41 U/L  14    ALT 0 - 44 U/L  <5    No phos from this admission  I/O: -800 mL since admit  NUTRITION - FOCUSED PHYSICAL EXAM: Deferred due to remote RD coverage  EDUCATION NEEDS:  Education needs have been addressed  Skin:  Skin Assessment: Reviewed RN Assessment  Last BM:  1/1 type 5  Height:  Ht Readings from Last 1 Encounters:  04/05/24 6' 3 (1.905 m)   Weight:  Wt Readings from Last 10 Encounters:  04/05/24 89.4 kg  12/29/23 93.4 kg  11/23/23 96.3 kg  11/06/23 97.1 kg  07/21/23 85.3 kg  06/12/23 82.3 kg  12/24/22 93.4 kg  12/01/22 91.6 kg  10/22/22 91.6 kg  08/06/22 93 kg   Weight Change: 4 Kg (4%) loss in 3 months - not clinically  significant  Usual Body Weight: 197-200 lbs stable per patient  Edema: no data charted  Ideal Body Weight:  89 kg   BMI:  Body mass index is 24.62 kg/m.  Estimated Daily Nutritional Needs:  Kcal:  2300-2500 Protein:  130-150 g Fluid:  >/=2300 mL    Leverne Ruth, MS, RDN, LDN [place]. [facility] See AMION for contact information Secure chat preferred

## 2024-04-07 NOTE — Progress Notes (Signed)
 "    Daily Progress Note Intern Pager: [phone]  Patient name: Louis Butler Medical record number: [id number] Date of birth: [birth year] Age: 69 y.o. Gender: male  Primary Care Provider: Donah Laymon PARAS, MD Consultants: Nephrology, palliative, RD Code Status: Full  Pt Overview and Major Events to Date:  12/31: Admitted to FMTS  Medical Decision Making:  Louis Butler 69 y.o. M with hx of ESRD on PD, Hx TURP with indwelling foley admitted for FTT, orthostatic hypotension, and urinary retention. Nephrology on board for PD.   Assessment & Plan ESRD on peritoneal dialysis [facility]) Urinary retention with chronic foley ESRD previously on HD, transitioned to PD in Fall of 2025 due to difficulty with HD adherence.  Approximately 1L UOP yesterday.  Has chronic indwelling Foley. Initial UA with leukocytes and bacteria, not treating for UTI this time given no symptoms.  - Appreciate ongoing nephrology recommendations - Nightly PD per nephrology - Continue home Sevelamer TID with meals - CTM UOP - FU urine culture - Palliative team consulted - AM CBC, RFP  Failure to thrive in adult Dizziness Orthostatic hypotension Patient with progressive decline in function, appetite, and p.o. intake.  Positive orthostatic vitals here, continues to have orthostatic symptoms.  Patient was previously on midodrine  with HD, did not continue this after transitioning to PD. - PD as above  - RD consulted - Palliative consulted - PT/OT eval and treat - Fall precautions Constipation LBM this morning.  - Continue daily Miralax  Chronic health problem Hx TURP, BPH: Continue home tamsulosin  GERD: Continue home Protonix  Neuropathy: Increase gabapentin  to 300 BID. Holding home Cymbalta while on Zoloft .  Mood: Continue home Zoloft  100 daily   FEN/GI: Renal diet with 2000 mL fluid restriction PPx: Heparin  Q8 Dispo: Pending PT/OT eval and clinical improvement  Subjective:  Feeling better this morning,  no current dizziness or lightheadedness. Was able to walk to bathroom earlier without symptoms. Does endorse that his typical neuropathy pain of burning toes kept him up at night. Tolerating PO better. Had a normal BM earlier.   Objective: Temp:  [97 F (36.1 C)-98.3 F (36.8 C)] 97.7 F (36.5 C) (12/31 2154) Pulse Rate:  [65-81] 79 (12/31 2154) Resp:  [11-18] 18 (12/31 2154) BP: (90-121)/(48-60) 102/54 (12/31 2154) SpO2:  [97 %-100 %] 97 % (12/31 2154) Physical Exam: General: No acute distress. Resting comfortably in room.  Cardiovascular: S1/S2, no extra heart sounds. Warm and well-perfused.  Respiratory: Breathing comfortably on room air. CTAB anteriorly. No increased WOB.  Abdomen: Normoactive BS. Soft, nontender, nondistended.  Extremities: No significant extremity swelling. Moves lower extremities and feet equally bilaterally. Sensation intact of bilateral toes though decreased.   Laboratory: Most recent CBC Lab Results  Component Value Date   WBC 11.6 (H) 04/05/2024   HGB 9.5 (L) 04/05/2024   HCT 28.8 (L) 04/05/2024   MCV 99.0 04/05/2024   PLT 229 04/05/2024   Most recent BMP    Latest Ref Rng & Units 04/05/2024    8:32 PM  BMP  Glucose 70 - 99 mg/dL 856   BUN 8 - 23 mg/dL 48   Creatinine 9.38 - 1.24 mg/dL 86.19   Sodium 864 - 854 mmol/L 126   Potassium 3.5 - 5.1 mmol/L 3.0   Chloride 98 - 111 mmol/L 84   CO2 22 - 32 mmol/L 23   Calcium  8.9 - 10.3 mg/dL 7.8     Louis Perkins, MD 04/07/2024, 4:20 AM  PGY-2, [facility] FPTS Intern pager: [phone],  text pages welcome Secure chat group [facility] Teaching Service   "

## 2024-04-07 NOTE — Assessment & Plan Note (Addendum)
 ESRD previously on HD, transitioned to PD in Fall of 2025 due to difficulty with HD adherence.  Approximately 1L UOP yesterday.  Has chronic indwelling Foley. Initial UA with leukocytes and bacteria, not treating for UTI this time given no symptoms.  - Appreciate ongoing nephrology recommendations - Nightly PD per nephrology - Continue home Sevelamer TID with meals - CTM UOP - FU urine culture - Palliative team consulted - AM CBC, RFP

## 2024-04-07 NOTE — Plan of Care (Signed)

## 2024-04-07 NOTE — Assessment & Plan Note (Addendum)
 Patient with progressive decline in function, appetite, and p.o. intake.  Positive orthostatic vitals here, continues to have orthostatic symptoms.  Patient was previously on midodrine  with HD, did not continue this after transitioning to PD. - PD as above  - RD consulted - Palliative consulted - PT/OT eval and treat - Fall precautions

## 2024-04-08 DIAGNOSIS — Z515 Encounter for palliative care: Secondary | ICD-10-CM | POA: Diagnosis not present

## 2024-04-08 DIAGNOSIS — N186 End stage renal disease: Secondary | ICD-10-CM | POA: Diagnosis not present

## 2024-04-08 DIAGNOSIS — Z7189 Other specified counseling: Secondary | ICD-10-CM

## 2024-04-08 DIAGNOSIS — Z992 Dependence on renal dialysis: Secondary | ICD-10-CM | POA: Diagnosis not present

## 2024-04-08 LAB — RENAL FUNCTION PANEL
Albumin: 2.8 g/dL — ABNORMAL LOW (ref 3.5–5.0)
Albumin: 2.9 g/dL — ABNORMAL LOW (ref 3.5–5.0)
Anion gap: 15 (ref 5–15)
Anion gap: 14 (ref 5–15)
BUN: 52 mg/dL — ABNORMAL HIGH (ref 8–23)
BUN: 46 mg/dL — ABNORMAL HIGH (ref 8–23)
CO2: 23 mmol/L (ref 22–32)
CO2: 24 mmol/L (ref 22–32)
Calcium: 6.8 mg/dL — ABNORMAL LOW (ref 8.9–10.3)
Calcium: 6.9 mg/dL — ABNORMAL LOW (ref 8.9–10.3)
Chloride: 86 mmol/L — ABNORMAL LOW (ref 98–111)
Chloride: 87 mmol/L — ABNORMAL LOW (ref 98–111)
Creatinine, Ser: 13.6 mg/dL — ABNORMAL HIGH (ref 0.61–1.24)
Creatinine, Ser: 12.2 mg/dL — ABNORMAL HIGH (ref 0.61–1.24)
GFR, Estimated: 4 mL/min — ABNORMAL LOW
GFR, Estimated: 4 mL/min — ABNORMAL LOW
Glucose, Bld: 213 mg/dL — ABNORMAL HIGH (ref 70–99)
Glucose, Bld: 137 mg/dL — ABNORMAL HIGH (ref 70–99)
Phosphorus: 6.5 mg/dL — ABNORMAL HIGH (ref 2.5–4.6)
Phosphorus: 6.1 mg/dL — ABNORMAL HIGH (ref 2.5–4.6)
Potassium: 3.7 mmol/L (ref 3.5–5.1)
Potassium: 3 mmol/L — ABNORMAL LOW (ref 3.5–5.1)
Sodium: 124 mmol/L — ABNORMAL LOW (ref 135–145)
Sodium: 125 mmol/L — ABNORMAL LOW (ref 135–145)

## 2024-04-08 LAB — CBC
HCT: 22.4 % — ABNORMAL LOW (ref 39.0–52.0)
HCT: 22 % — ABNORMAL LOW (ref 39.0–52.0)
Hemoglobin: 7.6 g/dL — ABNORMAL LOW (ref 13.0–17.0)
Hemoglobin: 7.7 g/dL — ABNORMAL LOW (ref 13.0–17.0)
MCH: 33 pg (ref 26.0–34.0)
MCH: 33.6 pg (ref 26.0–34.0)
MCHC: 33.9 g/dL (ref 30.0–36.0)
MCHC: 35 g/dL (ref 30.0–36.0)
MCV: 97.4 fL (ref 80.0–100.0)
MCV: 96.1 fL (ref 80.0–100.0)
Platelets: 200 K/uL (ref 150–400)
Platelets: 185 K/uL (ref 150–400)
RBC: 2.3 MIL/uL — ABNORMAL LOW (ref 4.22–5.81)
RBC: 2.29 MIL/uL — ABNORMAL LOW (ref 4.22–5.81)
RDW: 15 % (ref 11.5–15.5)
RDW: 15 % (ref 11.5–15.5)
WBC: 7.4 K/uL (ref 4.0–10.5)
WBC: 7.2 K/uL (ref 4.0–10.5)
nRBC: 0 % (ref 0.0–0.2)
nRBC: 0 % (ref 0.0–0.2)

## 2024-04-08 MED ORDER — MIDODRINE HCL 5 MG PO TABS
5.0000 mg | ORAL_TABLET | Freq: Three times a day (TID) | ORAL | Status: DC
  Administered 2024-04-08 – 2024-04-09 (×3): 5 mg via ORAL
  Filled 2024-04-08 (×3): qty 1

## 2024-04-08 MED ORDER — POTASSIUM CHLORIDE CRYS ER 20 MEQ PO TBCR
60.0000 meq | EXTENDED_RELEASE_TABLET | Freq: Once | ORAL | Status: AC
  Administered 2024-04-08: 60 meq via ORAL
  Filled 2024-04-08: qty 3

## 2024-04-08 MED ORDER — DELFLEX-LC/1.5% DEXTROSE 344 MOSM/L IP SOLN
INTRAPERITONEAL | Status: DC
  Administered 2024-04-09: 6000 mL via INTRAPERITONEAL

## 2024-04-08 MED ORDER — DARBEPOETIN ALFA 100 MCG/0.5ML IJ SOSY: 100.0000 ug | PREFILLED_SYRINGE | Freq: Once | INTRAMUSCULAR | Status: DC

## 2024-04-08 MED ORDER — DELFLEX-LC/2.5% DEXTROSE 394 MOSM/L IP SOLN: INTRAPERITONEAL | Status: DC

## 2024-04-08 MED ORDER — DARBEPOETIN ALFA 200 MCG/0.4ML IJ SOSY
200.0000 ug | PREFILLED_SYRINGE | Freq: Once | INTRAMUSCULAR | Status: AC
  Administered 2024-04-08: 200 ug via SUBCUTANEOUS
  Filled 2024-04-08: qty 0.4

## 2024-04-08 NOTE — Progress Notes (Signed)
 PD treatment initiated, dressing changed per protocol, Site clean, dry and intact   04/08/24 1900  Cycler Setup  Total Number of Night Cycles 5  Night Fill Volume 2500  Dianeal Solution Dextrose  1.5% in 6000 mL Low Cal/Low Mag  Night Dwell Time per Cycle - Hour(s) 1  Night Dwell Time per Cycle - Minute(s) 30  Night Time Therapy - Minute(s) 28  Night Time Therapy - Hour(s) 10  Minimum Initial Drain Volume 0  Maximum Peritoneal Volume 3750  Night/Total Therapy Volume 87499  Day Exchange No  Completion  Treatment Status Started  Education / Care Plan  Dialysis Education Provided Yes  Hand-off documentation  Hand-off Given Given to shift RN/LPN  Report given to (Full Name) Rosaline PEAK

## 2024-04-08 NOTE — Assessment & Plan Note (Signed)
 Patient stable on peritoneal dialysis per nephrology.  UOP slowed down from prior, only half a liter down. -Nephrology following, managing electrolytes and peritoneal dialysis.  Appreciate their recommendations and input -Continue nightly PD -Continue home sevelamer 3 times daily -Urine culture NGTD -Daily CBC, RFP follow-up nephrology notes to ensure that any p.m. labs are ordered and collected -Palliative consult today for further goals of care discussion

## 2024-04-08 NOTE — Progress Notes (Signed)
 PD treatment completed, Some of the values may not be accurate d/t patient tampering with machine. PD nurse asked patient not to touch machine. PD nurse asked patient what information he was looking for to assist, patient very rudely responded  I told you one time, I'm not telling you anymore. PD nurse attempted to education patient.   04/08/24 0701  Completion  Treatment Status Complete  Initial Drain Volume 1  Average Dwell Time-Hour(s) 1  Average Dwell Time-Min(s) 30  Average Drain Time 19  Total Therapy Volume 87501  Total Therapy Time-Hour(s) 10  Total Therapy Time-Min(s) 24  Weight after Drain  (per floor)  Effluent Appearance Clear;Yellow  Fluid Balance - CCPD  Total UF (+ value on cycler, pt loss) 38 mL  Procedure Comments  Tolerated treatment well? Yes  Peritoneal Dialysis Comments Patient tampering with machine  Education / Care Plan  Dialysis Education Provided Yes  Hand-off documentation  Hand-off Given Given to shift RN/LPN  Report given to (Full Name) Rosaline, RN  Hand-off Received Received from shift RN/LPN  Report received from (Full Name) Caron

## 2024-04-08 NOTE — Progress Notes (Signed)
" ° ° ° °  Daily Progress Note Intern Pager: [phone]  Patient name: Louis Butler Medical record number: [id number] Date of birth: 09-09-1955 Age: 69 y.o. Gender: male  Primary Care Provider: Donah Laymon PARAS, MD Consultants: Nephrology, palliative Code Status: Full  Pt Overview and Major Events to Date:  12/31: Admitted to FM TS  Assessment and Plan:  This is a 69 year old male patient with a history of ESRD previously on HD now on PD, history of TURP with indwelling Foley admitted for failure to thrive, orthostasis, and urinary retention. Assessment & Plan ESRD on peritoneal dialysis [facility]) Urinary retention with chronic foley Patient stable on peritoneal dialysis per nephrology.  UOP slowed down from prior, only half a liter down. -Nephrology following, managing electrolytes and peritoneal dialysis.  Appreciate their recommendations and input -Continue nightly PD -Continue home sevelamer 3 times daily -Urine culture NGTD -Daily CBC, RFP follow-up nephrology notes to ensure that any p.m. labs are ordered and collected -Palliative consult today for further goals of care discussion Failure to thrive in adult Dizziness Orthostatic hypotension Diastolic pressures continue to be low.  Patient received small bolus on admit with some improvement, any further boluses will be managed by nephrology.  Possibly secondary to poor p.o. intake, RD has been consulted, recommendations as below. - PD as above, considering low dose midodrine  to help with tolerance of PT/OT - RD consulted, recommendations as follows:  -Patient declined ONS  -Renal multivitamin daily  - Oral thiamine  daily for 7 days - Palliative consulted, follow-up recommendations - PT/OT eval and treat - Fall precautions Constipation No BM to date - Continue daily Miralax  consider adding senna Chronic health problem Hx TURP, BPH: Continue home tamsulosin  GERD: Continue home Protonix  Neuropathy: gabapentin  to 300 BID.  Holding home Cymbalta while on Zoloft .  Mood: Continue home Zoloft  100 daily  FEN/GI: Regular diet PPx: Heparin  Dispo:Pending PT recommendations  pending clinical improvement .   Subjective:  Patient is awake sitting up in bed with wife at bedside today.  He reports his pain has improved with increased dose of gabapentin  from yesterday.  Objective: Temp:  [97.8 F (36.6 C)-98.2 F (36.8 C)] 97.8 F (36.6 C) (01/02 0510) Pulse Rate:  [74-77] 77 (01/02 0510) Resp:  [19] 19 (01/02 0510) BP: (98-118)/(52-63) 103/57 (01/02 0510) SpO2:  [97 %-99 %] 97 % (01/02 0510) Physical Exam: General: Elderly appearing, no distress Cardiovascular: RRR, no M/R/G Respiratory: CTAB, no increased work of breathing Abdomen: Flat, soft, nontender Extremities: Warm, well-perfused  Laboratory: Most recent CBC Lab Results  Component Value Date   WBC 7.2 04/08/2024   HGB 7.7 (L) 04/08/2024   HCT 22.0 (L) 04/08/2024   MCV 96.1 04/08/2024   PLT 185 04/08/2024   Most recent BMP    Latest Ref Rng & Units 04/08/2024    3:53 AM  BMP  Glucose 70 - 99 mg/dL 862   BUN 8 - 23 mg/dL 46   Creatinine 9.38 - 1.24 mg/dL 87.79   Sodium 864 - 854 mmol/L 125   Potassium 3.5 - 5.1 mmol/L 3.0   Chloride 98 - 111 mmol/L 87   CO2 22 - 32 mmol/L 24   Calcium  8.9 - 10.3 mg/dL 6.9     Cleotilde Lukes, DO 04/08/2024, 8:03 AM  PGY-2,  Family Medicine FPTS Intern pager: [phone], text pages welcome Secure chat group [facility] Teaching Service   "

## 2024-04-08 NOTE — Assessment & Plan Note (Addendum)
 Hx TURP, BPH: Continue home tamsulosin  GERD: Continue home Protonix  Neuropathy: gabapentin  to 300 BID. Holding home Cymbalta while on Zoloft .  Mood: Continue home Zoloft  100 daily

## 2024-04-08 NOTE — Consult Note (Signed)
 "                                                   Palliative Care Consult Note                                  Date: 04/08/2024   Patient Name: Louis Butler  DOB: Aug 21, 1955  MRN: [id number]  Age / Sex: 69 y.o., male  PCP: Louis Laymon PARAS, MD Referring Physician: Madelon Donald HERO, DO  Reason for Consultation: Establishing goals of care  HPI/Patient Profile: 69 y.o. 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   Past Medical History:  Diagnosis Date   Arthritis    Cellulitis 03/14/2019   feet   CKD (chronic kidney disease)    Diabetes mellitus without complication (HCC)    Type II   End-stage kidney disease (HCC) 03/14/2019   was on hemodialysis ~ June 2021 - March 2022 when he discontinued; followed by Dr. Gordy Blanch   GERD (gastroesophageal reflux disease)    Heart murmur    History of blood transfusion    Hyperkalemia 04/29/2023   Hypertension    12/30/19- having low blood pressure since beginning   Irregular heart beat    Urinary tract infection  Bilateral Hydronephrosis 05/03/2023   UTI (urinary tract infection)     Subjective:   I have reviewed medical records including EPIC notes, labs and imaging, received update from nursing and attending team, assessed the patient and then met with the patient and his wife Louis Butler to discuss diagnosis prognosis, GOC, EOL wishes, disposition and options.  I introduced Palliative Medicine as specialized medical care for people living with serious illness. It focuses on providing relief from symptoms and stress of a serious illness. The goal is to improve quality of life for both the patient and the family.   Today's Discussion: Patient sitting in bed with wife at bedside.  Patient reports no pain or discomfort at this time.    We discussed  the patient's chronic diseases and his current hospitalization. The patient has ESRD requiring dialysis for approximately 1 year. He transition to peritoneal dialysis from hemodialysis this fall. They report the peritoneal dialysis has been working well for him. We discussed disease trajectory and expectations at end of life.  The patient does not have advance directives.  He is not interested in creating advanced directives.  If the patient were unable to make decisions he would like his wife Louis Butler to be his proxy decision-maker.  Discussed code status and recommended consideration of DNR status, understanding evidenced-based poor outcomes in similar hospitalized patients, as the cause of the arrest is likely associated with chronic/terminal disease rather than a reversible acute cardio-pulmonary event.  Patient confirmed he would like to remain full code.  We discussed scrips of care and the difference between an aggressive medical intervention path and a comfort focused path.  The patient would like to remain full scope of care.  The patient did share that he doesn't want to be a vegetable, doesn't want machines keeping him alive, and doesn't want to go to nursing facility.  His wife seems to have a good understanding and seems prepared to make decisions if/when the time comes. We discussed outpatient palliative and hospice services. He declines outpatient palliative follow up at this time.  Prior to this admission the patient was living at home with his wife of 50 years. They have 2 living children, grandchildren, and great-grandchildren. the patient worked labor-intensive jobs including at the landfill. He enjoys watching television and youtube since his retirement. His faith is important to him. The patient uses a rollator for long distance walking. He requires minimal assistance with showering and his wife does most of the cooking. His appetite has been poor and he usually eats just 1 meal a day. I shared  my worry that his functional status is declining and his reserve is becoming smaller. He is at increased risk for complications or rehospitalizations. Patient and wife understand.  Discussed the importance of continued conversation with family and the medical providers regarding overall plan of care and treatment options, ensuring decisions are within the context of the patient's values and GOCs.  Emotional support and therapeutic listening provided. Questions and concerns were addressed. Hard Choices booklet left for review. The family was encouraged to call with questions or concerns. PMT will continue to support holistically.  Review of Systems  Constitutional:  Positive for activity change, appetite change and fatigue.  Neurological:  Positive for weakness and light-headedness.    Objective:   Primary Diagnoses: Present on Admission:  Failure to thrive in adult  Orthostatic hypotension  Urinary tract infection  Bilateral Hydronephrosis  Constipation  Urinary retention with chronic foley   Physical Exam Vitals reviewed.  Constitutional:      General: He is not in acute distress. HENT:     Head: Normocephalic and atraumatic.     Comments: Deaf left ear Cardiovascular:     Rate and Rhythm: Normal rate.  Pulmonary:     Effort: Pulmonary effort is normal.  Skin:    General: Skin is warm and dry.  Neurological:     Mental Status: He is alert and oriented to person, place, and time.  Psychiatric:        Mood and Affect: Affect is flat.     Vital Signs:  BP (!) 99/55 (BP Location: Right Arm)   Pulse 73   Temp 98.2 F (36.8 C) (Oral)   Resp 19   Ht 6' 3 (1.905 m)   Wt 88.6 kg   SpO2 100%   BMI 24.41 kg/m   Palliative Assessment/Data: 50%    Advanced Care Planning:   Existing Vynca/ACP Documentation: None  Primary Decision Maker: PATIENT  Code Status/Advance Care Planning: Full code   Assessment & Plan:   SUMMARY OF RECOMMENDATIONS   Continue Full  Code and Full Scope Encouraged patient to consider DNR status Continued PMT support    Discussed with: bedside RN and attending team  Time Total: 90 minutes    Thank you for allowing us  to participate in the care of Louis LITTIE Corporal PMT will continue to support holistically.    Signed by: Stephane Palin, NP Palliative Medicine Team  Team Phone # [phone] (Nights/Weekends)  04/08/2024, 10:06 AM  "

## 2024-04-08 NOTE — Plan of Care (Signed)

## 2024-04-08 NOTE — Assessment & Plan Note (Addendum)
 Diastolic pressures continue to be low.  Patient received small bolus on admit with some improvement, any further boluses will be managed by nephrology.  Possibly secondary to poor p.o. intake, RD has been consulted, recommendations as below. - PD as above, considering low dose midodrine  to help with tolerance of PT/OT - RD consulted, recommendations as follows:  -Patient declined ONS  -Renal multivitamin daily  - Oral thiamine  daily for 7 days - Palliative consulted, follow-up recommendations - PT/OT eval and treat - Fall precautions

## 2024-04-08 NOTE — Progress Notes (Signed)
 Physical Therapy Treatment Patient Details Name: Louis Butler MRN: [id number] DOB: [birth year] Today's Date: 04/08/2024   History of Present Illness Pt is a 69 y.o. M who presents 04/05/2024 with weakness, dizziness malaise. Significant PMH: ESRD on peritoneal dialysis, T2DM, HTN, CA external auditory canal and neck, MDD, diabetic neuropathy.    PT Comments  Pt is limited by fatigue this session, tolerating 2 bouts of standing for <2 minutes each and needing to return to sitting due to reports of fatigue and weakness. BP in 90s/60s in sitting immediately after each bout of standing. Pt is encouraged to mobilize frequently in an effort to continue to improve the pt's activity tolerance. HHPT remains recommended pending continued improvement in endurance.    If plan is discharge home, recommend the following: A little help with walking and/or transfers;A little help with bathing/dressing/bathroom;Assistance with cooking/housework;Assist for transportation;Help with stairs or ramp for entrance   Can travel by private vehicle        Equipment Recommendations  None recommended by PT    Recommendations for Other Services       Precautions / Restrictions Precautions Precautions: Fall;Other (comment) Recall of Precautions/Restrictions: Impaired Precaution/Restrictions Comments: orthostatic Restrictions Weight Bearing Restrictions Per Provider Order: No     Mobility  Bed Mobility Overal bed mobility: Needs Assistance Bed Mobility: Supine to Sit, Sit to Supine     Supine to sit: Supervision Sit to supine: Supervision        Transfers Overall transfer level: Needs assistance Equipment used: Rollator (4 wheels) Transfers: Sit to/from Stand Sit to Stand: Contact guard assist, From elevated surface                Ambulation/Gait Ambulation/Gait assistance: Contact guard assist Gait Distance (Feet): 2 Feet Assistive device: Rollator (4 wheels) Gait Pattern/deviations:  Step-to pattern Gait velocity: reduced Gait velocity interpretation: <1.31 ft/sec, indicative of household ambulator   General Gait Details: pt marches in place for ~6 steps and then walks 2' forward prior to returning back to the bedside   Stairs             Wheelchair Mobility     Tilt Bed    Modified Rankin (Stroke Patients Only)       Balance Overall balance assessment: Needs assistance Sitting-balance support: No upper extremity supported, Feet supported Sitting balance-Leahy Scale: Good     Standing balance support: Bilateral upper extremity supported, Reliant on assistive device for balance Standing balance-Leahy Scale: Poor                              Communication Communication Communication: Impaired Factors Affecting Communication: Hearing impaired;Reduced clarity of speech  Cognition Arousal: Alert Behavior During Therapy: Flat affect   PT - Cognitive impairments: No apparent impairments                         Following commands: Intact      Cueing Cueing Techniques: Verbal cues  Exercises      General Comments General comments (skin integrity, edema, etc.): pt in NAD, BP in 90s/60s when seated after 2 standing bouts. Pt reports feelings of fatigue resulting in a need to sit. Significant history of orthostatic BP      Pertinent Vitals/Pain Pain Assessment Pain Assessment: No/denies pain    Home Living  Prior Function            PT Goals (current goals can now be found in the care plan section) Acute Rehab PT Goals Patient Stated Goal: to walk without dizziness Progress towards PT goals: Progressing toward goals    Frequency    Min 2X/week      PT Plan      Co-evaluation              AM-PAC PT 6 Clicks Mobility   Outcome Measure  Help needed turning from your back to your side while in a flat bed without using bedrails?: None Help needed moving from  lying on your back to sitting on the side of a flat bed without using bedrails?: A Little Help needed moving to and from a bed to a chair (including a wheelchair)?: A Little Help needed standing up from a chair using your arms (e.g., wheelchair or bedside chair)?: A Little Help needed to walk in hospital room?: Total Help needed climbing 3-5 steps with a railing? : Total 6 Click Score: 15    End of Session   Activity Tolerance: Patient limited by fatigue Patient left: in bed;with call bell/phone within reach;with bed alarm set Nurse Communication: Mobility status PT Visit Diagnosis: Difficulty in walking, not elsewhere classified (R26.2);Dizziness and giddiness (R42)     Time: 8494-8466 PT Time Calculation (min) (ACUTE ONLY): 28 min  Charges:    $Therapeutic Activity: 23-37 mins PT General Charges $$ ACUTE PT VISIT: 1 Visit                     Louis Butler, PT, DPT Acute Rehabilitation Office [phone]    Louis Butler 04/08/2024, 3:50 PM

## 2024-04-08 NOTE — TOC CM/SW Note (Signed)
 Transition of Care [facility]) - Inpatient Brief Assessment   Patient Details  Name: BENJIMEN KELLEY MRN: [id number] Date of Birth: 08/12/1955  [facility]) CM/SW Contact:    Tom-Johnson, Rondell Pardon Daphne, RN Phone Number: 04/08/2024, 1:22 PM   Clinical Narrative:  Patient presented to the ED with worsening Generalized  Weakness, Dizziness, and Malaise. Patient has hx of ESRD on Peritoneal Dialysis at home. Nephrology following.  CM spoke with patient at bedside about needs for post hospital transition. Patient lives with his wife Shona, has two supportive children, has five siblings that are not supportive. Shona assists with patient's care. Has all necessary DME's at home.  PCP is Donah Laymon PARAS, MD and uses [facility] [employer] in [place] and [address] [employer] delivery.   Home health recommended, patient declined, also declined outpatient PT.  Patient not Medically ready for discharge.  CM will continue to follow as patient progresses with care towards discharge.      Transition of Care Asessment: Insurance and Status: Insurance coverage has been reviewed Patient has primary care physician: Yes Home environment has been reviewed: Yes Prior level of function:: Modified Independent Prior/Current Home Services: No current home services Social Drivers of Health Review: SDOH reviewed no interventions necessary Readmission risk has been reviewed: Yes Transition of care needs: transition of care needs identified, TOC will continue to follow (Declining Home healh recommendations.)

## 2024-04-08 NOTE — Assessment & Plan Note (Signed)
 No BM to date - Continue daily Miralax  consider adding senna

## 2024-04-08 NOTE — Care Management Important Message (Signed)
 Important Message  Patient Details  Name: Louis TOLSON MRN: [id number] Date of Birth: 1955/06/10   Important Message Given:  Yes - Medicare IM     Claretta Deed 04/08/2024, 3:32 PM

## 2024-04-08 NOTE — Progress Notes (Signed)
 " [facility] Progress Note   Subjective:   Seen in room. No complaints. Very weak especially with PT yesterday. Wife at bedside.   Objective Vitals:   04/07/24 2013 04/08/24 0508 04/08/24 0510 04/08/24 0800  BP: (!) 118/52 (!) 98/59 (!) 103/57 (!) 99/55  Pulse: 74 74 77 73  Resp: 19 19 19    Temp: 97.8 F (36.6 C) 97.8 F (36.6 C) 97.8 F (36.6 C) 98.2 F (36.8 C)  TempSrc: Oral  Oral Oral  SpO2: 99% 97% 97% 100%  Weight:      Height:       Physical Exam General: Alert male In NAD Lungs: CTA bilaterally, respirations unlabored Abdomen: soft, non-distended, nontender Extremities: no edema b/l lower extremities Neuro: awake, alert Dialysis Access:  PD cath in lower abdomen, no surrounding redness/drainage  Additional Objective Labs: Basic Metabolic Panel: Recent Labs  Lab 04/07/24 0937 04/07/24 2134 04/08/24 0353  NA 124* 124* 125*  K 2.9* 3.7 3.0*  CL 86* 86* 87*  CO2 20* 23 24  GLUCOSE 196* 213* 137*  BUN 51* 52* 46*  CREATININE 13.50* 13.60* 12.20*  CALCIUM  6.9* 6.8* 6.9*  PHOS 7.4* 6.5* 6.1*   Liver Function Tests: Recent Labs  Lab 04/05/24 2032 04/07/24 0937 04/07/24 2134 04/08/24 0353  AST 14*  --   --   --   ALT <5  --   --   --   ALKPHOS 89  --   --   --   BILITOT 0.5  --   --   --   PROT 8.2*  --   --   --   ALBUMIN  3.3* 2.8* 2.8* 2.9*   No results for input(s): LIPASE, AMYLASE in the last 168 hours. CBC: Recent Labs  Lab 04/05/24 2032 04/07/24 0937 04/07/24 2134 04/08/24 0353  WBC 11.6* 7.4 7.4 7.2  HGB 9.5* 7.7* 7.6* 7.7*  HCT 28.8* 22.6* 22.4* 22.0*  MCV 99.0 96.6 97.4 96.1  PLT 229 185 200 185   Blood Culture    Component Value Date/Time   SDES URINE, CATHETERIZED 04/06/2024 1117   SPECREQUEST NONE 04/06/2024 1117   CULT  04/06/2024 1117    NO GROWTH Performed at [facility], [address], [place], [place] [address]    REPTSTATUS 04/07/2024 FINAL 04/06/2024 1117    Cardiac Enzymes: No  results for input(s): CKTOTAL, CKMB, CKMBINDEX, TROPONINI in the last 168 hours. CBG: Recent Labs  Lab 04/05/24 2039  GLUCAP 127*   Iron  Studies: No results for input(s): IRON , TIBC, TRANSFERRIN, FERRITIN in the last 72 hours. @lablastinr3 @ Studies/Results: DG Chest Port 1 View Result Date: 04/06/2024 EXAM: 1 VIEW(S) XRAY OF THE CHEST 04/06/2024 10:16:17 AM COMPARISON: 04/30/2023 CLINICAL HISTORY: weakness FINDINGS: LINES, TUBES AND DEVICES: Right IJ central line removed. LUNGS AND PLEURA: No focal pulmonary opacity. No pleural effusion. No pneumothorax. HEART AND MEDIASTINUM: No acute abnormality of the cardiac and mediastinal silhouettes. BONES AND SOFT TISSUES: No acute osseous abnormality. IMPRESSION: 1. Interval removal of a right IJ central line. 2. No acute cardiopulmonary abnormality. Electronically signed by: Morgane Naveau MD 04/06/2024 11:07 AM EST RP Workstation: [id number]   CT ABDOMEN PELVIS W CONTRAST Result Date: 04/06/2024 EXAM: CT ABDOMEN AND PELVIS WITH CONTRAST 04/06/2024 10:03:21 AM TECHNIQUE: CT of the abdomen and pelvis was performed with the administration of 100 mL iohexol (OMNIPAQUE) 350 MG/ML injection. Multiplanar reformatted images are provided for review. Automated exposure control, iterative reconstruction, and/or weight-based adjustment of the mA/kV was utilized to reduce  the radiation dose to as low as reasonably achievable. COMPARISON: CT of the abdomen and pelvis dated 04/30/2023. CLINICAL HISTORY: Abdominal pain, acute, nonlocalized; hx ESRD, dialysis. chronic foley came out last week, decreased urine output. ?abd pain. FINDINGS: LOWER CHEST: No acute abnormality. LIVER: The liver is unremarkable. GALLBLADDER AND BILE DUCTS: There are small stones lying independently within the gallbladder. There is no evidence of cholecystitis. No biliary ductal dilatation. SPLEEN: No acute abnormality. PANCREAS: No acute abnormality. ADRENAL GLANDS: No acute  abnormality. KIDNEYS, URETERS AND BLADDER: Moderate-to-severe hydroureteronephrosis present bilaterally. Marked bilateral renal cortical atrophy. Proteinaceous cysts arising from the right kidney. Renovascular calcifications. Soft tissue stranding present about the ureters bilaterally, more pronounced on the left. No stones in the kidneys or ureters. Urinary bladder is unremarkable. GI AND BOWEL: Stomach demonstrates no acute abnormality. There is no bowel obstruction. PERITONEUM AND RETROPERITONEUM: No ascites. No free air. There is a peritoneal dialysis catheter. VASCULATURE: The abdominal aorta is normal in caliber and demonstrates moderate calcific atheromatous disease. The mesenteric and renal arteries are patent. LYMPH NODES: No lymphadenopathy. REPRODUCTIVE ORGANS: No acute abnormality. BONES AND SOFT TISSUES: No acute osseous abnormality. No focal soft tissue abnormality. IMPRESSION: 1. Moderate-to-severe bilateral hydroureteronephrosis with soft tissue stranding about the ureters, more pronounced on the left. 2. Marked bilateral renal cortical atrophy with likely benign proteinaceous cysts in the right kidney; no follow-up imaging recommended. 3. Cholelithiasis without evidence of cholecystitis. Electronically signed by: Timothy Berrigan MD 04/06/2024 10:52 AM EST RP Workstation: [id number]   Medications:  dialysis solution 1.5% low-MG/low-CA     dialysis solution 2.5% low-MG/low-CA      DULoxetine  30 mg Oral Daily   gabapentin   300 mg Oral QHS   gentamicin cream  1 Application Topical Daily   heparin   5,000 Units Subcutaneous Q8H   multivitamin  1 tablet Oral QHS   polyethylene glycol  17 g Oral Daily   sertraline   100 mg Oral Daily   sevelamer carbonate  2,400 mg Oral TID WC   tamsulosin   0.4 mg Oral Daily   thiamine   100 mg Oral Daily    Dialysis Orders: CCPD - [facility], Dr. Tobie is his primary nephrologist 5 exchanges, 2.5L fill volume, EDW 90kg - Mircera 225mcg sq monthly, last  12/17  Assessment/Plan:  UTI with urinary obstruction/hydronephrosis/presumed pyelo: Prior indwelling foley, accidentally pulled out last week - now has been replaced. Initially on abx, have now been stopped.   Hyponatremia: Doesn't appear overloaded. Given 0.5L IVF bolus on admit. Unclear if Urine Osm being low holds any weight in  light of urinary obstruction. Na continues to low, reinforced fluid restriction--order 1.2L restriction, will UF as tolerated. Will replete K to see if this helps osmolality. Na 125 today  ESRD:  On PD at home, will continue nightly - no edema on exam, plan half 1.5% and half 2.5% bags tonight  Hypokalemia: S/p supplementation, repeat labs pending. OK for regular diet for now, will follow electrolytes closely. KCL 60meq x 1 today ordered  Hypotension/volume: BP low on admission, does not appear on antihypertensives at home and was previously on midodrine . Resume midodrine  if needed  Anemia: Hgb 9.5 - last ESA dose ~2 weeks ago. Restarting ESA-aranesp  200mcg x 1 dose today  Metabolic bone disease: CorrCa ok, follow. Phos high, c/w renvela. Refuses renal diet  Nutrition:  Alb low, adding supplements. Palliative care to see patient  Discussed with wife at the bedside.   Ephriam Stank, MD [facility]   "

## 2024-04-09 DIAGNOSIS — N186 End stage renal disease: Secondary | ICD-10-CM | POA: Diagnosis not present

## 2024-04-09 DIAGNOSIS — Z515 Encounter for palliative care: Secondary | ICD-10-CM | POA: Diagnosis not present

## 2024-04-09 DIAGNOSIS — Z992 Dependence on renal dialysis: Secondary | ICD-10-CM | POA: Diagnosis not present

## 2024-04-09 LAB — RENAL FUNCTION PANEL
Albumin: 2.8 g/dL — ABNORMAL LOW (ref 3.5–5.0)
Anion gap: 12 (ref 5–15)
BUN: 43 mg/dL — ABNORMAL HIGH (ref 8–23)
CO2: 26 mmol/L (ref 22–32)
Calcium: 7 mg/dL — ABNORMAL LOW (ref 8.9–10.3)
Chloride: 89 mmol/L — ABNORMAL LOW (ref 98–111)
Creatinine, Ser: 11.3 mg/dL — ABNORMAL HIGH (ref 0.61–1.24)
GFR, Estimated: 4 mL/min — ABNORMAL LOW
Glucose, Bld: 125 mg/dL — ABNORMAL HIGH (ref 70–99)
Phosphorus: 5.1 mg/dL — ABNORMAL HIGH (ref 2.5–4.6)
Potassium: 3.6 mmol/L (ref 3.5–5.1)
Sodium: 127 mmol/L — ABNORMAL LOW (ref 135–145)

## 2024-04-09 MED ORDER — PROSOURCE PLUS PO LIQD
30.0000 mL | Freq: Two times a day (BID) | ORAL | Status: DC
  Administered 2024-04-09 – 2024-04-13 (×8): 30 mL via ORAL
  Filled 2024-04-09 (×10): qty 30

## 2024-04-09 MED ORDER — MIDODRINE HCL 5 MG PO TABS
10.0000 mg | ORAL_TABLET | Freq: Three times a day (TID) | ORAL | Status: DC
  Administered 2024-04-09 – 2024-04-18 (×28): 10 mg via ORAL
  Filled 2024-04-09 (×28): qty 2

## 2024-04-09 NOTE — Assessment & Plan Note (Addendum)
 Patient stable on peritoneal dialysis per nephrology.  UOP slowed down from prior, only half a liter down. -Nephrology following, managing electrolytes and peritoneal dialysis.  Appreciate their recommendations and input -Continue nightly PD - ESA redosed 1/2 -Continue home sevelamer  3 times daily -Urine culture NGTD -Daily CBC, RFP follow-up nephrology notes to ensure that any p.m. labs are ordered and collected -Palliative consult   - appreciate recommendations

## 2024-04-09 NOTE — Progress Notes (Signed)
" ° ° ° °  Daily Progress Note Intern Pager: [phone]  Patient name: Louis Butler Medical record number: [id number] Date of birth: 02-16-1956 Age: 69 y.o. Gender: male  Primary Care Provider: Donah Laymon PARAS, MD Consultants: Nephrology, palliative Code Status: Full   Pt Overview and Major Events to Date:  12/31: Admitted to [facility] TS   Assessment and Plan:   This is a 69 year old male patient with a history of ESRD previously on HD now on PD, history of TURP with indwelling Foley admitted for failure to thrive, orthostasis, and urinary retention.  Nauseous with one episode of emesis today. No signs of ileus or SBO with last BM last night. Likely due to metabolic derangements from ESRD and medications while not eating much. Responsive to zofran .  Assessment & Plan ESRD on peritoneal dialysis [facility]) Urinary retention with chronic foley Patient stable on peritoneal dialysis per nephrology.  UOP slowed down from prior, only half a liter down. -Nephrology following, managing electrolytes and peritoneal dialysis.  Appreciate their recommendations and input -Continue nightly PD - ESA redosed 1/2 -Continue home sevelamer  3 times daily -Urine culture NGTD -Daily CBC, RFP follow-up nephrology notes to ensure that any p.m. labs are ordered and collected -Palliative consult   - appreciate recommendations Failure to thrive in adult Dizziness Orthostatic hypotension Diastolic pressures continue to be low but MAPs > 65. Possibly secondary to poor p.o. intake, RD has been consulted, recommendations as below. - Continue 10 mg midodrine  TID per nephrology  - RD consulted, recommendations as follows:  -Patient declined ONS  -Renal multivitamin daily  - Oral thiamine  daily for 7 days - Palliative consulted, follow-up recommendations - PT/OT eval and treat - Fall precautions Chronic health problem Hx TURP, BPH: Continue home tamsulosin  GERD: Continue home Protonix  Neuropathy: gabapentin  to 300  BID, home cymbalta   Mood: Continue home Zoloft  100 daily  FEN/GI: Regular diet (Patient declined renal diet)  PPx: Heparin  Dispo:Pending clinical improvement, recommended for home health PT   Subjective:  Patient reports nausea with just having had an episode of emesis. No abdominal pain. Had a bowel movement last night.   Objective: Temp:  [97.7 F (36.5 C)-98.9 F (37.2 C)] 98.3 F (36.8 C) (01/03 0835) Pulse Rate:  [74-79] 74 (01/03 0835) Resp:  [17-18] 17 (01/03 0835) BP: (96-112)/(50-63) 112/50 (01/03 0835) SpO2:  [97 %-99 %] 97 % (01/03 0835) Weight:  [110 kg] 110 kg (01/03 0600) Physical Exam: General: Chronically ill appearing  Cardiovascular: RRR Respiratory: Normal work of breathing on room air  Abdomen: Non tender, non distended, soft  Laboratory: Most recent CBC Lab Results  Component Value Date   WBC 7.2 04/08/2024   HGB 7.7 (L) 04/08/2024   HCT 22.0 (L) 04/08/2024   MCV 96.1 04/08/2024   PLT 185 04/08/2024   Most recent BMP    Latest Ref Rng & Units 04/09/2024    5:52 AM  BMP  Glucose 70 - 99 mg/dL 874   BUN 8 - 23 mg/dL 43   Creatinine 9.38 - 1.24 mg/dL 88.69   Sodium 864 - 854 mmol/L 127   Potassium 3.5 - 5.1 mmol/L 3.6   Chloride 98 - 111 mmol/L 89   CO2 22 - 32 mmol/L 26   Calcium  8.9 - 10.3 mg/dL 7.0      Nicholas Bar, MD 04/09/2024, 9:14 AM  PGY-3, [facility] FPTS Intern pager: [phone], text pages welcome Secure chat group [facility] Teaching Service   "

## 2024-04-09 NOTE — Progress Notes (Signed)
 "  [facility] Progress Note   Subjective:   Seen in room. Appears PD went fine overnight - net UF only 88mL. + nausea this AM, and also has had dizziness with standing. No CP/dyspnea.  Objective Vitals:   04/08/24 2203 04/09/24 0534 04/09/24 0600 04/09/24 0835  BP: 100/63 (!) 96/53  (!) 112/50  Pulse: 79 74  74  Resp: 18 17  17   Temp: 97.8 F (36.6 C) 97.7 F (36.5 C)  98.3 F (36.8 C)  TempSrc:      SpO2: 99% 98%  97%  Weight:   110 kg   Height:       Physical Exam General: Well appearing man, NAD. Room air Heart: RRR Lungs: CTAB, no rales Abdomen: soft, non-tender Extremities: no LE edema Dialysis Access:  PD cath  Additional Objective Labs: Basic Metabolic Panel: Recent Labs  Lab 04/07/24 2134 04/08/24 0353 04/09/24 0552  NA 124* 125* 127*  K 3.7 3.0* 3.6  CL 86* 87* 89*  CO2 23 24 26   GLUCOSE 213* 137* 125*  BUN 52* 46* 43*  CREATININE 13.60* 12.20* 11.30*  CALCIUM  6.8* 6.9* 7.0*  PHOS 6.5* 6.1* 5.1*   Liver Function Tests: Recent Labs  Lab 04/05/24 2032 04/07/24 0937 04/07/24 2134 04/08/24 0353 04/09/24 0552  AST 14*  --   --   --   --   ALT <5  --   --   --   --   ALKPHOS 89  --   --   --   --   BILITOT 0.5  --   --   --   --   PROT 8.2*  --   --   --   --   ALBUMIN  3.3*   < > 2.8* 2.9* 2.8*   < > = values in this interval not displayed.   CBC: Recent Labs  Lab 04/05/24 2032 04/07/24 0937 04/07/24 2134 04/08/24 0353  WBC 11.6* 7.4 7.4 7.2  HGB 9.5* 7.7* 7.6* 7.7*  HCT 28.8* 22.6* 22.4* 22.0*  MCV 99.0 96.6 97.4 96.1  PLT 229 185 200 185   Blood Culture    Component Value Date/Time   SDES URINE, CATHETERIZED 04/06/2024 1117   SPECREQUEST NONE 04/06/2024 1117   CULT  04/06/2024 1117    NO GROWTH Performed at [facility], [address], [place], [place] [address]    REPTSTATUS 04/07/2024 FINAL 04/06/2024 1117   Medications:  dialysis solution 1.5% low-MG/low-CA     dialysis solution 2.5% low-MG/low-CA       DULoxetine   30 mg Oral Daily   gabapentin   300 mg Oral QHS   gentamicin  cream  1 Application Topical Daily   heparin   5,000 Units Subcutaneous Q8H   midodrine   5 mg Oral TID WC   multivitamin  1 tablet Oral QHS   polyethylene glycol  17 g Oral Daily   sertraline   100 mg Oral Daily   sevelamer  carbonate  2,400 mg Oral TID WC   tamsulosin   0.4 mg Oral Daily   thiamine   100 mg Oral Daily    Dialysis Orders CCPD - [facility], Dr. Tobie is his primary nephrologist 5 exchanges, 2.5L fill volume, EDW 90kg - Mircera 225mcg sq monthly, last 12/17   Assessment/Plan:  UTI with urinary obstruction/hydronephrosis/presumed pyelo: Prior indwelling foley, accidentally pulled out last week - now has been replaced. Initially on abx - now stopped. Urine Cx 12/31 negative. Hyponatremia: Doesn't appear overloaded. Given 0.5L IVF bolus on admit. Likely  a little overloaded. ESRD:  On PD at home, will continue nightly - no edema on exam, continue half 1.5% and half 2.5% bags tonight Hypokalemia: S/p supplementation, finally some improving. Continue Kcl 20mEq daily at this time. Hypotension/volume: BP low on admission, does not appear on antihypertensives at home. Now on midodrine  5mg  TID, will increase to 10mg  TID.  Anemia: Hgb 9.5 -> 7.7. Aranesp  200mcg given 1/2 - follow.  Metabolic bone disease: CorrCa ok, follow. Phos slightly high c/w renvela . Refuses renal diet - drinking Pepsi today.  Nutrition:  Alb low, adding supplements.  GOC: S/p palliative care visit, wants to continue full scope/FULL CODE/   Louis Boehringer, PA-C 04/09/2024, 10:57 AM  [facility]    "

## 2024-04-09 NOTE — Assessment & Plan Note (Addendum)
 Hx TURP, BPH: Continue home tamsulosin  GERD: Continue home Protonix  Neuropathy: gabapentin  to 300 BID, home cymbalta   Mood: Continue home Zoloft  100 daily

## 2024-04-09 NOTE — Progress Notes (Signed)
 "                                                                                                                                                                                                          Daily Progress Note   Patient Name: Louis Butler       Date: 04/09/2024 DOB: [birth year]  Age: 69 y.o. MRN#: [id number] Attending Physician: Anders Otto DASEN, MD Primary Care Physician: Donah Laymon PARAS, MD Admit Date: 04/05/2024  Reason for Consultation/Follow-up: Establishing goals of care  Length of Stay: 3  Current Medications: Scheduled Meds:   (feeding supplement) PROSource Plus  30 mL Oral BID BM   DULoxetine   30 mg Oral Daily   gabapentin   300 mg Oral QHS   gentamicin  cream  1 Application Topical Daily   heparin   5,000 Units Subcutaneous Q8H   midodrine   10 mg Oral TID WC   multivitamin  1 tablet Oral QHS   polyethylene glycol  17 g Oral Daily   sertraline   100 mg Oral Daily   sevelamer  carbonate  2,400 mg Oral TID WC   tamsulosin   0.4 mg Oral Daily   thiamine   100 mg Oral Daily    Continuous Infusions:  dialysis solution 1.5% low-MG/low-CA     dialysis solution 2.5% low-MG/low-CA      PRN Meds: acetaminophen , hydrOXYzine , ondansetron , pantoprazole   Physical Exam Vitals reviewed.  Constitutional:      General: He is not in acute distress. HENT:     Head: Normocephalic and atraumatic.  Cardiovascular:     Rate and Rhythm: Normal rate.  Pulmonary:     Effort: Pulmonary effort is normal.  Skin:    General: Skin is dry.  Neurological:     Mental Status: He is alert and oriented to person, place, and time.             Vital Signs: BP (!) 112/50 (BP Location: Right Arm)   Pulse 74   Temp 98.3 F (36.8 C)   Resp 17   Ht 6' 3 (1.905 m)   Wt 110 kg   SpO2 97%   BMI 30.31 kg/m  SpO2: SpO2: 97 % O2 Device: O2 Device: Room Air O2 Flow Rate:     Palliative Assessment/Data: 50%      Patient Active Problem List   Diagnosis Date Noted   ESRD on  peritoneal dialysis (HCC) 04/06/2024   Failure to thrive in adult 04/06/2024   Dizziness 04/06/2024   Routine adult health maintenance 07/21/2023   Chronic health problem 06/11/2023  Hypokalemia 06/08/2023   Hydronephrosis 06/06/2023   Hyponatremia 06/04/2023   Person awaiting admission to adequate facility elsewhere 05/30/2023   Generalized weakness 05/17/2023   Orthostatic hypotension 05/07/2023   Depressed mood 05/04/2023   Urinary tract infection  Bilateral Hydronephrosis 05/03/2023   Type 2 diabetes mellitus with diabetic chronic kidney disease (HCC) 05/03/2023   Ulcer of sacral region, stage 2 (HCC) 05/03/2023   Anemia 05/03/2023   Malnutrition of moderate degree 05/01/2023   Uremia 04/30/2023   Acute renal failure superimposed on chronic kidney disease 04/30/2023   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   Chronic bilateral low back pain without sciatica 07/27/2022   Neck mass 06/02/2022   Coronary artery calcification 07/02/2021   Chest pain 11/06/2020   Skin rash 07/19/2020   Recurrent UTI    Pain due to onychomycosis of toenails of both feet 12/28/2019   History of colonic polyps 09/29/2019   Exocrine pancreatic insufficiency 09/27/2019   BPH (benign prostatic hyperplasia) 08/08/2019   Intestinal metaplasia of gastric mucosa 07/25/2019   Loose bowel movements 05/21/2019   Indigestion 05/21/2019   Preop examination 05/21/2019   Calculus of gallbladder without cholecystitis without obstruction 05/21/2019   Diabetic neuropathy (HCC) 05/16/2019   Urinary retention with chronic foley 04/15/2019   Alcohol dependence (HCC) 04/15/2019   Right renal mass 04/15/2019   Constipation 03/19/2019   Tobacco use 03/14/2019   Rash of both feet 03/14/2019   ESRD (end stage renal disease) (HCC) 03/13/2019   Diabetes mellitus (HCC) 08/22/2015   Hypertension 08/22/2015    Palliative Care Assessment & Plan   Patient Profile: 69 y.o. 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   Today's Discussion: Reviewed chart. Patient worked with physical therapy yesterday afternoon but was limited by fatigue and weakness. He had peritoneal dialysis overnight.   Patient lying in bed. He reports nausea/vomiting. He is waiting for nurse to bring antiemetic. Wife is at bedside sleeping but she awakens when I enter the room. Patient asks what I want. Told him I wanted to check in after our discussion yesterday. He appeared uncomfortable and agitated. We agreed tomorrow would be a better time to continue goals of care discussions.  Encouraged patient and wife to call PMT with questions or needs. PMT will continue to support.  Recommendations/Plan: Full Code Full Scope Encouraged patient and family to continue discussing goals of care Continued PMT support    Code Status:    Code Status Orders  (From admission, onward)           Start     Ordered   04/06/24 1249  Full code  Continuous       Question:  By:  Answer:  Consent: discussion documented in EHR   04/06/24 1255         Extensive chart review has been completed prior to seeing the patient including labs, vital signs, imaging, progress/consult notes, orders, medications, and available advance directive documents.  Care plan was discussed with attending team  Time spent: 25 minutes  Thank you for allowing the Palliative Medicine Team to assist in the care of this patient.    Stephane CHRISTELLA Palin, NP  Please contact Palliative Medicine Team phone at [phone] for questions and concerns.       "

## 2024-04-09 NOTE — Assessment & Plan Note (Addendum)
 Diastolic pressures continue to be low but MAPs > 65. Possibly secondary to poor p.o. intake, RD has been consulted, recommendations as below. - Continue 10 mg midodrine  TID per nephrology  - RD consulted, recommendations as follows:  -Patient declined ONS  -Renal multivitamin daily  - Oral thiamine  daily for 7 days - Palliative consulted, follow-up recommendations - PT/OT eval and treat - Fall precautions

## 2024-04-09 NOTE — Assessment & Plan Note (Deleted)
 No BM to date - Continue daily Miralax  consider adding senna

## 2024-04-09 NOTE — Plan of Care (Signed)

## 2024-04-09 NOTE — Progress Notes (Signed)
" °   04/09/24 0800  Completion  Treatment Status Complete  Initial Drain Volume 3  Average Dwell Time-Hour(s) 1  Average Dwell Time-Min(s) 30  Average Drain Time 23  Total Therapy Volume [id number]  Total Therapy Time-Hour(s) 10  Total Therapy Time-Min(s) 23  Weight after Drain 242 lb 8.1 oz (110 kg)  Effluent Appearance Clear;Yellow  Cell Count on Daytime Exchange N/A  Fluid Balance - CCPD  Total UF (+ value on cycler, pt loss) 88 mL  Procedure Comments  Tolerated treatment well? Yes  Education / Care Plan  Dialysis Education Provided Yes  Hand-off documentation  Hand-off Given Given to shift RN/LPN  Report given to (Full Name) Warren Ducking RN   PD post treatment note  PD treatment completed. Patient tolerated treatment well. PD effluent is clear. No specimen collected.  PD exit site clean, dry and intact. Patient is awake, oriented and in no acute distress.  Report given to bedside nurse.   Post treatment VS: WNL  Total UF removed:  88ml  Post treatment weight: 110.0kg--standing per staff "

## 2024-04-10 ENCOUNTER — Inpatient Hospital Stay (HOSPITAL_COMMUNITY)

## 2024-04-10 DIAGNOSIS — N186 End stage renal disease: Secondary | ICD-10-CM | POA: Diagnosis not present

## 2024-04-10 DIAGNOSIS — Z992 Dependence on renal dialysis: Secondary | ICD-10-CM

## 2024-04-10 DIAGNOSIS — E871 Hypo-osmolality and hyponatremia: Secondary | ICD-10-CM | POA: Diagnosis not present

## 2024-04-10 DIAGNOSIS — I951 Orthostatic hypotension: Secondary | ICD-10-CM

## 2024-04-10 DIAGNOSIS — I499 Cardiac arrhythmia, unspecified: Secondary | ICD-10-CM

## 2024-04-10 DIAGNOSIS — R110 Nausea: Secondary | ICD-10-CM | POA: Insufficient documentation

## 2024-04-10 DIAGNOSIS — R627 Adult failure to thrive: Secondary | ICD-10-CM | POA: Diagnosis not present

## 2024-04-10 DIAGNOSIS — Z7189 Other specified counseling: Secondary | ICD-10-CM | POA: Diagnosis not present

## 2024-04-10 DIAGNOSIS — Z515 Encounter for palliative care: Secondary | ICD-10-CM | POA: Diagnosis not present

## 2024-04-10 LAB — RENAL FUNCTION PANEL
Albumin: 2.9 g/dL — ABNORMAL LOW (ref 3.5–5.0)
Anion gap: 12 (ref 5–15)
BUN: 43 mg/dL — ABNORMAL HIGH (ref 8–23)
CO2: 26 mmol/L (ref 22–32)
Calcium: 7.4 mg/dL — ABNORMAL LOW (ref 8.9–10.3)
Chloride: 90 mmol/L — ABNORMAL LOW (ref 98–111)
Creatinine, Ser: 10.8 mg/dL — ABNORMAL HIGH (ref 0.61–1.24)
GFR, Estimated: 5 mL/min — ABNORMAL LOW
Glucose, Bld: 137 mg/dL — ABNORMAL HIGH (ref 70–99)
Phosphorus: 4.5 mg/dL (ref 2.5–4.6)
Potassium: 4.1 mmol/L (ref 3.5–5.1)
Sodium: 127 mmol/L — ABNORMAL LOW (ref 135–145)

## 2024-04-10 LAB — CBC
HCT: 23.4 % — ABNORMAL LOW (ref 39.0–52.0)
Hemoglobin: 7.8 g/dL — ABNORMAL LOW (ref 13.0–17.0)
MCH: 33.3 pg (ref 26.0–34.0)
MCHC: 33.3 g/dL (ref 30.0–36.0)
MCV: 100 fL (ref 80.0–100.0)
Platelets: 200 K/uL (ref 150–400)
RBC: 2.34 MIL/uL — ABNORMAL LOW (ref 4.22–5.81)
RDW: 15.5 % (ref 11.5–15.5)
WBC: 7.9 K/uL (ref 4.0–10.5)
nRBC: 0 % (ref 0.0–0.2)

## 2024-04-10 MED ORDER — METOCLOPRAMIDE HCL 5 MG/ML IJ SOLN
5.0000 mg | Freq: Every evening | INTRAMUSCULAR | Status: DC | PRN
  Administered 2024-04-10: 5 mg via INTRAVENOUS
  Filled 2024-04-10: qty 2

## 2024-04-10 MED ORDER — FERRIC CITRATE 1 GM 210 MG(FE) PO TABS
210.0000 mg | ORAL_TABLET | Freq: Three times a day (TID) | ORAL | Status: DC
  Administered 2024-04-10 – 2024-04-18 (×21): 210 mg via ORAL
  Filled 2024-04-10 (×22): qty 1

## 2024-04-10 MED ORDER — DELFLEX-LC/2.5% DEXTROSE 394 MOSM/L IP SOLN
INTRAPERITONEAL | Status: DC
  Administered 2024-04-09: 6000 mL via INTRAPERITONEAL
  Administered 2024-04-13: 12500 mL via INTRAPERITONEAL

## 2024-04-10 NOTE — Assessment & Plan Note (Addendum)
-   Continue 10 mg midodrine  TID per nephrology  - RD consulted, recommendations as follows:  - Prosource feeding supplement BID  - Renal multivitamin daily  - Oral thiamine  daily for 7 days (1/1-7) - Palliative consulted, follow-up recommendations - PT/OT eval and treat - Fall precautions

## 2024-04-10 NOTE — Plan of Care (Signed)

## 2024-04-10 NOTE — Assessment & Plan Note (Signed)
-   Place patient on telemetry  - AM EKG  - AM BMP, magnesium

## 2024-04-10 NOTE — Progress Notes (Signed)
 "                                                                                                                                                                                                          Daily Progress Note   Patient Name: Louis Butler       Date: 04/10/2024 DOB: 1955/06/09  Age: 69 y.o. MRN#: [id number] Attending Physician: Delores Suzann HERO, MD Primary Care Physician: Donah Laymon PARAS, MD Admit Date: 04/05/2024  Reason for Consultation/Follow-up: Establishing goals of care  Length of Stay: 4  Current Medications: Scheduled Meds:   (feeding supplement) PROSource Plus  30 mL Oral BID BM   DULoxetine   30 mg Oral Daily   ferric citrate   210 mg Oral TID WC   gabapentin   300 mg Oral QHS   gentamicin  cream  1 Application Topical Daily   heparin   5,000 Units Subcutaneous Q8H   midodrine   10 mg Oral TID WC   multivitamin  1 tablet Oral QHS   polyethylene glycol  17 g Oral Daily   sertraline   100 mg Oral Daily   tamsulosin   0.4 mg Oral Daily   thiamine   100 mg Oral Daily    Continuous Infusions:  [START ON 04/11/2024] dialysis solution 2.5% low-MG/low-CA      PRN Meds: acetaminophen , hydrOXYzine , metoCLOPramide  (REGLAN ) injection, ondansetron , pantoprazole   Physical Exam Vitals reviewed.  Constitutional:      General: He is not in acute distress. HENT:     Head: Normocephalic and atraumatic.  Cardiovascular:     Rate and Rhythm: Normal rate.  Pulmonary:     Effort: Pulmonary effort is normal.  Skin:    General: Skin is dry.  Neurological:     Mental Status: He is alert and oriented to person, place, and time.             Vital Signs: BP (!) 112/54 (BP Location: Right Arm)   Pulse 74   Temp 98.2 F (36.8 C)   Resp 16   Ht 6' 3 (1.905 m)   Wt 110 kg   SpO2 96%   BMI 30.31 kg/m  SpO2: SpO2: 96 % O2 Device: O2 Device: Room Air O2 Flow Rate:     Palliative Assessment/Data: 50%      Patient Active Problem List   Diagnosis Date Noted    Nausea 04/10/2024   Irregular heart rhythm 04/10/2024   ESRD on peritoneal dialysis (HCC) 04/06/2024   Failure to thrive in adult 04/06/2024   Dizziness 04/06/2024   Routine adult health maintenance  07/21/2023   Chronic health problem 06/11/2023   Hypokalemia 06/08/2023   Hydronephrosis 06/06/2023   Hyponatremia 06/04/2023   Person awaiting admission to adequate facility elsewhere 05/30/2023   Generalized weakness 05/17/2023   Orthostatic hypotension 05/07/2023   Depressed mood 05/04/2023   Urinary tract infection  Bilateral Hydronephrosis 05/03/2023   Type 2 diabetes mellitus with diabetic chronic kidney disease (HCC) 05/03/2023   Ulcer of sacral region, stage 2 (HCC) 05/03/2023   Anemia 05/03/2023   Malnutrition of moderate degree 05/01/2023   Uremia 04/30/2023   Acute renal failure superimposed on chronic kidney disease 04/30/2023   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   Chronic bilateral low back pain without sciatica 07/27/2022   Neck mass 06/02/2022   Coronary artery calcification 07/02/2021   Chest pain 11/06/2020   Skin rash 07/19/2020   Recurrent UTI    Pain due to onychomycosis of toenails of both feet 12/28/2019   History of colonic polyps 09/29/2019   Exocrine pancreatic insufficiency 09/27/2019   BPH (benign prostatic hyperplasia) 08/08/2019   Intestinal metaplasia of gastric mucosa 07/25/2019   Loose bowel movements 05/21/2019   Indigestion 05/21/2019   Preop examination 05/21/2019   Calculus of gallbladder without cholecystitis without obstruction 05/21/2019   Diabetic neuropathy (HCC) 05/16/2019   Urinary retention with chronic foley 04/15/2019   Alcohol dependence (HCC) 04/15/2019   Right renal mass 04/15/2019   Tobacco use 03/14/2019   Rash of both feet 03/14/2019   ESRD (end stage renal disease) (HCC) 03/13/2019   Diabetes mellitus (HCC) 08/22/2015   Hypertension 08/22/2015    Palliative Care Assessment & Plan   Patient  Profile: 69 y.o. 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   Today's Discussion: Reviewed chart.  Patient had nausea again this morning.  The team has added as needed Reglan  at nighttime with his peritoneal dialysis.  Patient sitting up in bed watching television.  He looks better than yesterday and states his nausea is feeling better than this morning.  No family at bedside.  Patient allowed me to review our conversation from Friday.  We discussed his end-stage renal disease and his experiences since he was diagnosed.  We discussed goals of care.  Patient still wants to remain full code and full scope.  He is open to outpatient palliative follow-up to assist with further goals of care conversations and symptoms that might arise.  Spoke to patient's wife Louis Butler by phone.  Louis Butler has a good understanding of the patient's prognosis.  She shared that she knows his time on [place] is limited.  She shared the patient took care of his father as he died from end-stage renal disease.  Moving forward, Louis Butler hopes the patient will make decisions regarding his goals of care but is also prepared to make those decisions if he is unable to.    Emotional support and therapeutic listening provided.  Encouraged patient and wife to call PMT with questions or needs. PMT will continue to support.  Told patient, his wife, and team that PMT will follow-up in a few days unless needs arise sooner.  Recommendations/Plan: Full Code Full Scope Outpatient palliative care at discharge Encouraged patient and family to continue discussing goals of care Continued PMT support    Code Status:    Code Status Orders  (From admission, onward)  Start     Ordered   04/06/24 1249  Full code  Continuous       Question:  By:   Answer:  Consent: discussion documented in EHR   04/06/24 1255         Extensive chart review has been completed prior to seeing the patient including labs, vital signs, imaging, progress/consult notes, orders, medications, and available advance directive documents.  Care plan was discussed with attending team  Time spent: 55 minutes  Thank you for allowing the Palliative Medicine Team to assist in the care of this patient.    Louis CHRISTELLA Palin, NP  Please contact Palliative Medicine Team phone at [phone] for questions and concerns.       "

## 2024-04-10 NOTE — Progress Notes (Signed)
" °  PD treatment initiated with problems. Antiemetic ministered per Shift RN prior to initiation.   04/10/24 2200  Peritoneal Catheter Mid lower abdomen  No placement date or time found.   Catheter Location: Mid lower abdomen  Site Assessment Clean, Dry, Intact;Pink  Drainage Description None  Catheter status Accessed  Dressing Gauze/Drain sponge  Dressing Status Clean, Dry, Intact  Dressing Intervention New dressing/dressing changed  Cycler Setup  Total Number of Night Cycles 5  Night Fill Volume 2500  Dianeal  Solution Dextrose  2.5% in 6000 mL Low Cal/Low Mag  Night Dwell Time per Cycle - Hour(s) 1  Night Dwell Time per Cycle - Minute(s) 30  Night Time Therapy - Minute(s) 28  Night Time Therapy - Hour(s) 10  Minimum Initial Drain Volume 0  Maximum Peritoneal Volume 3750  Night/Total Therapy Volume 87499  Day Exchange No  Completion  Treatment Status Started  Hand-off documentation  Hand-off Received Received from shift RN/LPN  Report received from (Full Name) Mashore, Bari HERO, RN    "

## 2024-04-10 NOTE — Assessment & Plan Note (Addendum)
-  Nephrology following, managing electrolytes and peritoneal dialysis.  Appreciate their recommendations and input -Continue nightly PD - ESA redosed 1/2, hgb stable, transfusion threshold < 7  - Binder switched to Auryxia  210 mg TID to see if it would improve nausea  - Continue renvela   - Continue flomax   -Daily CBC, RFP  -Palliative consulted, appreciate recommendations

## 2024-04-10 NOTE — Progress Notes (Signed)
 " Louis Butler Progress Note   Subjective:   Seen in room - PD just finishing up. + nausea this AM still. No abd pain, no CP/dyspnea. PD effluent is clear.  Objective Vitals:   04/09/24 1645 04/09/24 2035 04/10/24 0422 04/10/24 0813  BP: (!) 101/52 (!) 106/49 (!) 103/55 (!) 112/54  Pulse: 70 70 72 74  Resp: 18 18 18 16   Temp: 97.7 F (36.5 C) 98.1 F (36.7 C) 97.6 F (36.4 C) 98.2 F (36.8 C)  TempSrc:      SpO2: 95% 97% 97% 96%  Weight:      Height:       Physical Exam General: Well appearing, older man, NAD. Room air Heart: RRR Lungs: CTAB, no rales Abdomen: soft, non-tender Extremities: no LE edema Dialysis Access:  PD cath  Additional Objective Labs: Basic Metabolic Panel: Recent Labs  Lab 04/08/24 0353 04/09/24 0552 04/10/24 0558  NA 125* 127* 127*  K 3.0* 3.6 4.1  CL 87* 89* 90*  CO2 24 26 26   GLUCOSE 137* 125* 137*  BUN 46* 43* 43*  CREATININE 12.20* 11.30* 10.80*  CALCIUM  6.9* 7.0* 7.4*  PHOS 6.1* 5.1* 4.5   Liver Function Tests: Recent Labs  Lab 04/05/24 2032 04/07/24 0937 04/08/24 0353 04/09/24 0552 04/10/24 0558  AST 14*  --   --   --   --   ALT <5  --   --   --   --   ALKPHOS 89  --   --   --   --   BILITOT 0.5  --   --   --   --   PROT 8.2*  --   --   --   --   ALBUMIN  3.3*   < > 2.9* 2.8* 2.9*   < > = values in this interval not displayed.   CBC: Recent Labs  Lab 04/05/24 2032 04/07/24 0937 04/07/24 2134 04/08/24 0353 04/10/24 0558  WBC 11.6* 7.4 7.4 7.2 7.9  HGB 9.5* 7.7* 7.6* 7.7* 7.8*  HCT 28.8* 22.6* 22.4* 22.0* 23.4*  MCV 99.0 96.6 97.4 96.1 100.0  PLT 229 185 200 185 200   Medications:  dialysis solution 1.5% low-MG/low-CA     dialysis solution 2.5% low-MG/low-CA      (feeding supplement) PROSource Plus  30 mL Oral BID BM   DULoxetine   30 mg Oral Daily   gabapentin   300 mg Oral QHS   gentamicin  cream  1 Application Topical Daily   heparin   5,000 Units Subcutaneous Q8H   midodrine   10 mg Oral TID WC    multivitamin  1 tablet Oral QHS   polyethylene glycol  17 g Oral Daily   sertraline   100 mg Oral Daily   sevelamer  carbonate  2,400 mg Oral TID WC   tamsulosin   0.4 mg Oral Daily   thiamine   100 mg Oral Daily    Dialysis Orders [facility] - [facility], Dr. Tobie is his primary nephrologist 5 exchanges, 2.5L fill volume, EDW 90kg - Mircera 225mcg sq monthly, last 12/17   Assessment/Plan:  UTI with urinary obstruction/hydronephrosis: Prior indwelling foley, accidentally pulled out last week - now has been replaced. Initially on abx - now stopped. Urine Cx 12/31 negative. Hyponatremia: Given 0.5L IVF bolus on admit. Likely a little overloaded, changing PD dextrose  for tonight. Nausea: On antiemetics, will change binder to Auryxia  - see if helps. ?gastroparesis. ESRD:  On PD at home, Na remains low - all 2.5% tonight. Hypokalemia: S/p supplementation, finally improved.  Hypotension/volume: BP low on admission, not on antihypertensives at home. Started on midodrine  10mg  TID.  Anemia: Hgb 9.5 -> 7.7. Aranesp  given 1/2 - follow.  Metabolic bone disease: CorrCa ok, follow. Phos better, on sevelamer . Refuses renal diet.  Nutrition:  Alb low, continue supplements.  GOC: S/p palliative care visit, wants to continue full scope/full code     Izetta Boehringer, PA-C 04/10/2024, 10:04 AM  [facility]    "

## 2024-04-10 NOTE — Progress Notes (Signed)
 "    Daily Progress Note Intern Pager: [phone]  Patient name: Louis Butler Medical record number: [id number] Date of birth: [birth year] Age: 69 y.o. Gender: male  Primary Care Provider: Donah Laymon PARAS, MD Consultants: Nephrology, RD, Palliative  Code Status: Full  Pt Overview and Major Events to Date:  12/31: Admitted to [facility]   Assessment and Plan:   This is a 69 year old male patient with a history of ESRD previously on HD now on PD, history of TURP with indwelling Foley admitted for failure to thrive, orthostasis, and urinary retention. Improving with continued PD and nutritional supplementation.   EKG on 1/2 with some irregularity. EKG yesterday regular. Patient without any chest pain or palpitations. However some concern patient may be going in and out of an irregular rhythm, so will place on telemetry.   Continued nausea with PD overnight and in the morning. Had prn zofran  but would take it late in the morning after feeling nauseous for a while. Replaced with prn reglan  with PD overnight. No signs of obstruction at this time; however, patient did also have evidence of gallstones on imagining on admission; thus, will evaluate with RUQ ultrasound. Appreciate nephrology also changing binder to see if this would improve nausea.  Assessment & Plan ESRD on peritoneal dialysis [facility]) Urinary retention with chronic foley -Nephrology following, managing electrolytes and peritoneal dialysis.  Appreciate their recommendations and input -Continue nightly PD - ESA redosed 1/2, hgb stable, transfusion threshold < 7  - Binder switched to Auryxia  210 mg TID to see if it would improve nausea  - Continue renvela   - Continue flomax   -Daily CBC, RFP  -Palliative consulted, appreciate recommendations Failure to thrive in adult Dizziness Orthostatic hypotension - Continue 10 mg midodrine  TID per nephrology  - RD consulted, recommendations as follows:  - Prosource feeding supplement  BID  - Renal multivitamin daily  - Oral thiamine  daily for 7 days (1/1-7) - Palliative consulted, follow-up recommendations - PT/OT eval and treat - Fall precautions Nausea - reglan  prn with PD  - sevelamer  switched to auryxia   - RUQ ultrasound  - Continue monitor  Irregular heart rhythm - Place patient on telemetry  - AM EKG  - AM BMP, magnesium   Chronic health problem Hx TURP, BPH: Continue home tamsulosin  GERD: Continue home Protonix  Neuropathy: gabapentin  to 300 BID, home cymbalta   Mood: Continue home Zoloft  100 daily   FEN/GI: Regular diet (patient declined renal diet) with 1200 mL fluids restriction.  PPx: heparin  Dispo:Home health pending clinical improvement   Subjective:  Patient reports feeling nauseous overnight and this morning. Feels like he cannot eat due to the nausea. Still having regular bowel movements, no abdominal pain or vomiting.   Objective: Temp:  [97.6 F (36.4 C)-98.2 F (36.8 C)] 98.2 F (36.8 C) (01/04 0813) Pulse Rate:  [70-74] 74 (01/04 0813) Resp:  [16-18] 16 (01/04 0813) BP: (101-112)/(49-55) 112/54 (01/04 0813) SpO2:  [95 %-97 %] 96 % (01/04 0813) Physical Exam: General: chronically ill appearing, nauseous Cardiovascular: RRR Respiratory: Normal work of breathing on room air  Abdomen: Soft, non distended, non tender  Extremities: No BLE edema   Laboratory: Most recent CBC Lab Results  Component Value Date   WBC 7.9 04/10/2024   HGB 7.8 (L) 04/10/2024   HCT 23.4 (L) 04/10/2024   MCV 100.0 04/10/2024   PLT 200 04/10/2024   Most recent BMP    Latest Ref Rng & Units 04/10/2024    5:58 AM  BMP  Glucose 70 -  99 mg/dL 862   BUN 8 - 23 mg/dL 43   Creatinine 9.38 - 1.24 mg/dL 89.19   Sodium 864 - 854 mmol/L 127   Potassium 3.5 - 5.1 mmol/L 4.1   Chloride 98 - 111 mmol/L 90   CO2 22 - 32 mmol/L 26   Calcium  8.9 - 10.3 mg/dL 7.4    Nicholas Bar, MD 04/10/2024, 11:49 AM  PGY-3, [facility] FPTS Intern pager:  [phone], text pages welcome Secure chat group [facility] Teaching Service   "

## 2024-04-10 NOTE — Assessment & Plan Note (Signed)
-   reglan  prn with PD  - sevelamer  switched to auryxia   - RUQ ultrasound  - Continue monitor

## 2024-04-10 NOTE — Assessment & Plan Note (Addendum)
 Hx TURP, BPH: Continue home tamsulosin  GERD: Continue home Protonix  Neuropathy: gabapentin  to 300 BID, home cymbalta   Mood: Continue home Zoloft  100 daily

## 2024-04-11 ENCOUNTER — Inpatient Hospital Stay (HOSPITAL_COMMUNITY)

## 2024-04-11 DIAGNOSIS — R519 Headache, unspecified: Secondary | ICD-10-CM

## 2024-04-11 DIAGNOSIS — N186 End stage renal disease: Secondary | ICD-10-CM | POA: Diagnosis not present

## 2024-04-11 DIAGNOSIS — Z992 Dependence on renal dialysis: Secondary | ICD-10-CM | POA: Diagnosis not present

## 2024-04-11 DIAGNOSIS — R627 Adult failure to thrive: Secondary | ICD-10-CM | POA: Diagnosis not present

## 2024-04-11 LAB — VITAMIN B12: Vitamin B-12: 610 pg/mL (ref 180–914)

## 2024-04-11 LAB — RENAL FUNCTION PANEL
Albumin: 2.8 g/dL — ABNORMAL LOW (ref 3.5–5.0)
Anion gap: 12 (ref 5–15)
BUN: 43 mg/dL — ABNORMAL HIGH (ref 8–23)
CO2: 24 mmol/L (ref 22–32)
Calcium: 7.3 mg/dL — ABNORMAL LOW (ref 8.9–10.3)
Chloride: 90 mmol/L — ABNORMAL LOW (ref 98–111)
Creatinine, Ser: 10.2 mg/dL — ABNORMAL HIGH (ref 0.61–1.24)
GFR, Estimated: 5 mL/min — ABNORMAL LOW
Glucose, Bld: 151 mg/dL — ABNORMAL HIGH (ref 70–99)
Phosphorus: 4.6 mg/dL (ref 2.5–4.6)
Potassium: 3.2 mmol/L — ABNORMAL LOW (ref 3.5–5.1)
Sodium: 126 mmol/L — ABNORMAL LOW (ref 135–145)

## 2024-04-11 LAB — CBC
HCT: 23.2 % — ABNORMAL LOW (ref 39.0–52.0)
Hemoglobin: 7.7 g/dL — ABNORMAL LOW (ref 13.0–17.0)
MCH: 33 pg (ref 26.0–34.0)
MCHC: 33.2 g/dL (ref 30.0–36.0)
MCV: 99.6 fL (ref 80.0–100.0)
Platelets: 185 K/uL (ref 150–400)
RBC: 2.33 MIL/uL — ABNORMAL LOW (ref 4.22–5.81)
RDW: 15.1 % (ref 11.5–15.5)
WBC: 6.9 K/uL (ref 4.0–10.5)
nRBC: 0 % (ref 0.0–0.2)

## 2024-04-11 MED ORDER — POTASSIUM CHLORIDE CRYS ER 20 MEQ PO TBCR
40.0000 meq | EXTENDED_RELEASE_TABLET | Freq: Once | ORAL | Status: AC
  Administered 2024-04-11: 40 meq via ORAL
  Filled 2024-04-11: qty 2

## 2024-04-11 MED ORDER — METOCLOPRAMIDE HCL 5 MG/ML IJ SOLN
5.0000 mg | Freq: Two times a day (BID) | INTRAMUSCULAR | Status: AC
  Administered 2024-04-11 – 2024-04-12 (×2): 5 mg via INTRAVENOUS
  Filled 2024-04-11 (×3): qty 2

## 2024-04-11 MED ORDER — POTASSIUM CHLORIDE CRYS ER 20 MEQ PO TBCR
20.0000 meq | EXTENDED_RELEASE_TABLET | Freq: Every day | ORAL | Status: DC
  Administered 2024-04-11 – 2024-04-18 (×8): 20 meq via ORAL
  Filled 2024-04-11 (×8): qty 1

## 2024-04-11 NOTE — Assessment & Plan Note (Addendum)
"-   Nephrology following, managing electrolytes and peritoneal dialysis. Appreciate their recommendations and input - Continue nightly PD - Continue Auryxia  210 mg TID  - Continue Flomax  0.4 mg daily - Daily RFP "

## 2024-04-11 NOTE — Assessment & Plan Note (Addendum)
-   Continue 10 mg midodrine  TID per nephrology  - RD consulted, appreciate recommendations:  - Prosource feeding supplement BID  - Renal multivitamin daily  - Oral thiamine  daily for 7 days (1/1-7) - Continue to work with PT/OT - Fall precautions

## 2024-04-11 NOTE — Assessment & Plan Note (Addendum)
 With complaint of headache this morning and persistent nausea, concern for subdural hematoma. - CT head without contrast

## 2024-04-11 NOTE — Assessment & Plan Note (Addendum)
 NSR since on continuous telemetry. - Discontinue telemetry at this time - AM EKG, monitor QTc

## 2024-04-11 NOTE — Assessment & Plan Note (Addendum)
 Hx TURP, BPH: Continue home tamsulosin  GERD: Continue home Protonix  Neuropathy: gabapentin  to 300 BID, home cymbalta   Mood: Continue home Zoloft  100 daily

## 2024-04-11 NOTE — Progress Notes (Signed)
 Occupational Therapy Treatment Patient Details Name: Louis Butler MRN: [id number] DOB: 16-Aug-1955 Today's Date: 04/11/2024   History of present illness Pt is a 69 y.o. M who presents 04/05/2024 with weakness, dizziness malaise. Significant PMH: ESRD on peritoneal dialysis, T2DM, HTN, CA external auditory canal and neck, MDD, diabetic neuropathy.   OT comments  Patient agreeable for transfer training to/from recliner.  Comfortable to sit up about 15 min.  Overall setup and up to CGA for basic transfers.  Patient stating he needs to walk about 25 feet at home.  OT to continue efforts in the acute setting to address deficits, and HH OT can be recommended if the patient is interested.        If plan is discharge home, recommend the following:  A little help with walking and/or transfers;A little help with bathing/dressing/bathroom;Assistance with cooking/housework;Assist for transportation;Help with stairs or ramp for entrance   Equipment Recommendations  None recommended by OT    Recommendations for Other Services      Precautions / Restrictions Precautions Precautions: Fall;Other (comment) Recall of Precautions/Restrictions: Intact Precaution/Restrictions Comments: orthostatic Restrictions Weight Bearing Restrictions Per Provider Order: No       Mobility Bed Mobility Overal bed mobility: Needs Assistance Bed Mobility: Supine to Sit     Supine to sit: Supervision, HOB elevated          Transfers Overall transfer level: Needs assistance Equipment used: Rolling walker (2 wheels) Transfers: Sit to/from Stand, Bed to chair/wheelchair/BSC Sit to Stand: Contact guard assist, From elevated surface     Step pivot transfers: Contact guard assist           Balance Overall balance assessment: Needs assistance Sitting-balance support: No upper extremity supported, Feet supported Sitting balance-Leahy Scale: Good     Standing balance support: Reliant on assistive device  for balance Standing balance-Leahy Scale: Poor                             ADL either performed or assessed with clinical judgement   ADL       Grooming: Set up;Sitting   Upper Body Bathing: Set up;Sitting   Lower Body Bathing: Contact guard assist;Sit to/from stand   Upper Body Dressing : Set up;Sitting   Lower Body Dressing: Contact guard assist;Sit to/from stand;Minimal assistance                      Extremity/Trunk Assessment Upper Extremity Assessment Upper Extremity Assessment: Overall WFL for tasks assessed   Lower Extremity Assessment Lower Extremity Assessment: Defer to PT evaluation        Vision Patient Visual Report: No change from baseline     Perception Perception Perception: Not tested   Praxis Praxis Praxis: Not tested   Communication Communication Communication: No apparent difficulties   Cognition Arousal: Alert Behavior During Therapy: Flat affect Cognition: No apparent impairments                               Following commands: Intact        Cueing   Cueing Techniques: Verbal cues  Exercises      Shoulder Instructions       General Comments      Pertinent Vitals/ Pain       Pain Assessment Pain Assessment: No/denies pain  Frequency  Min 2X/week        Progress Toward Goals  OT Goals(current goals can now be found in the care plan section)  Progress towards OT goals: Progressing toward goals  Acute Rehab OT Goals Patient Stated Goal: Return home OT Goal Formulation: With patient Time For Goal Achievement: 04/21/24 Potential to Achieve Goals: Fair  Plan      Co-evaluation                 AM-PAC OT 6 Clicks Daily Activity     Outcome Measure   Help from another person eating meals?: None Help from another person taking care of personal grooming?: A Little Help from another person  toileting, which includes using toliet, bedpan, or urinal?: A Little Help from another person bathing (including washing, rinsing, drying)?: A Little Help from another person to put on and taking off regular upper body clothing?: None Help from another person to put on and taking off regular lower body clothing?: A Little 6 Click Score: 20    End of Session    OT Visit Diagnosis: Unsteadiness on feet (R26.81);Other abnormalities of gait and mobility (R26.89);Muscle weakness (generalized) (M62.81)   Activity Tolerance Patient tolerated treatment well   Patient Left in chair;with call bell/phone within reach   Nurse Communication Mobility status        Time: 9054-8998 OT Time Calculation (min): 16 min  Charges: OT General Charges $OT Visit: 1 Visit OT Treatments $Therapeutic Activity: 8-22 mins  04/11/2024  RP, OTR/L  Acute Rehabilitation Services  Office:  [phone]   Charlie JONETTA Halsted 04/11/2024, 10:04 AM

## 2024-04-11 NOTE — Progress Notes (Signed)
 " [facility] Progress Note   Subjective:   Seen in room - feels a little better today, less nauseated. Ate breakfast this AM. S/p PD overnight - net UF 1.3L. Na still low today.  Objective Vitals:   04/10/24 1951 04/11/24 0500 04/11/24 0555 04/11/24 0852  BP: (!) 106/51  (!) 102/49 (!) 97/53  Pulse: 80  72 84  Resp: 17  17 18   Temp: 98.1 F (36.7 C)  98.3 F (36.8 C) 98.1 F (36.7 C)  TempSrc: Oral  Oral   SpO2: 96%  96% 96%  Weight:  121.1 kg    Height:       Physical Exam General: Well appearing, older man, NAD. Room air Heart: RRR Lungs: CTAB, no rales Abdomen: soft, non-tender Extremities: no LE edema Dialysis Access:  PD cath  Additional Objective Labs: Basic Metabolic Panel: Recent Labs  Lab 04/09/24 0552 04/10/24 0558 04/11/24 0425  NA 127* 127* 126*  K 3.6 4.1 3.2*  CL 89* 90* 90*  CO2 26 26 24   GLUCOSE 125* 137* 151*  BUN 43* 43* 43*  CREATININE 11.30* 10.80* 10.20*  CALCIUM  7.0* 7.4* 7.3*  PHOS 5.1* 4.5 4.6   Liver Function Tests: Recent Labs  Lab 04/05/24 2032 04/07/24 0937 04/09/24 0552 04/10/24 0558 04/11/24 0425  AST 14*  --   --   --   --   ALT <5  --   --   --   --   ALKPHOS 89  --   --   --   --   BILITOT 0.5  --   --   --   --   PROT 8.2*  --   --   --   --   ALBUMIN  3.3*   < > 2.8* 2.9* 2.8*   < > = values in this interval not displayed.   CBC: Recent Labs  Lab 04/07/24 0937 04/07/24 2134 04/08/24 0353 04/10/24 0558 04/11/24 0425  WBC 7.4 7.4 7.2 7.9 6.9  HGB 7.7* 7.6* 7.7* 7.8* 7.7*  HCT 22.6* 22.4* 22.0* 23.4* 23.2*  MCV 96.6 97.4 96.1 100.0 99.6  PLT 185 200 185 200 185   Studies/Results: US  Abdomen Limited RUQ (LIVER/GB) Result Date: 04/10/2024 CLINICAL DATA:  Nausea, vomiting EXAM: ULTRASOUND ABDOMEN LIMITED RIGHT UPPER QUADRANT COMPARISON:  April 06, 2024 FINDINGS: Gallbladder: Mild cholelithiasis is noted with largest calculus measuring 8 mm. No sonographic Murphy's sign is noted. No significant  gallbladder wall thickening is noted. Common bile duct: Diameter: 3 mm which is within normal limits. Liver: No focal lesion identified. Within normal limits in parenchymal echogenicity. Portal vein is patent on color Doppler imaging with normal direction of blood flow towards the liver. Other: Right hydronephrosis is noted as described on prior CT scan. IMPRESSION: 1. Mild cholelithiasis without evidence of cholecystitis. 2. Right hydronephrosis is noted as described on prior CT scan. Electronically Signed   By: Lynwood Landy Raddle M.D.   On: 04/10/2024 17:53   Medications:  dialysis solution 2.5% low-MG/low-CA      (feeding supplement) PROSource Plus  30 mL Oral BID BM   DULoxetine   30 mg Oral Daily   ferric citrate   210 mg Oral TID WC   gabapentin   300 mg Oral QHS   gentamicin  cream  1 Application Topical Daily   heparin   5,000 Units Subcutaneous Q8H   midodrine   10 mg Oral TID WC   multivitamin  1 tablet Oral QHS   polyethylene glycol  17 g Oral Daily  sertraline   100 mg Oral Daily   tamsulosin   0.4 mg Oral Daily   thiamine   100 mg Oral Daily   Dialysis Orders CCPD - [facility], Dr. Tobie is his primary nephrologist 5 exchanges, 2.5L fill volume, EDW 90kg - Mircera 225mcg sq monthly, last 12/17   Assessment/Plan: UTI with urinary obstruction/hydronephrosis: Prior indwelling foley, accidentally pulled out last week - now has been replaced. Initially on abx - now stopped. Urine Cx 12/31 negative. Hyponatremia: Given 0.5L IVF bolus on admit. Likely a little overloaded at this time. Nausea: On antiemetics, will change binder to Auryxia  - see if helps. ?gastroparesis. Improved today. ESRD:  On PD at home, Na remains low - continue all 2.5% bags tonight. Hypokalemia: Recurrent, add Kcl 20mEq daily. Hypotension/volume: BP low on admission, not on antihypertensives at home. Started on midodrine  10mg  TID.  Anemia: Hgb 9.5 -> 7.7. Aranesp  200mcg given 1/2 - follow.  Metabolic bone disease: CorrCa ok,  follow. Phos better, have changed binder to Auryxia . Refuses renal diet.  Nutrition:  Alb low, continue supplements.  GOC: S/p palliative care visit, wants to continue full scope/full code   Louis Boehringer, PA-C 04/11/2024, 9:51 AM  [facility]    "

## 2024-04-11 NOTE — Progress Notes (Addendum)
 FMTS Attending Daily Note: Suzann Daring, MD  Team Pager [phone] Pager [phone]  I have seen and examined this patient, and reviewed their chart. I have discussed this patient with the resident. Will sign resident note as available.   Persistent nausea and vomiting, slightly improved. No abdominal pain or RUQ pain after meals suggestive fo bilary colic. Has slight headache, will obtain CT to ensure chronic subdural hematoma not contributing to nausea, vomiting, and headache. If not improving (as he has been) ask GI to weigh on next steps for patient given degree of symptoms.

## 2024-04-11 NOTE — Progress Notes (Addendum)
 "    Daily Progress Note Intern Pager: [phone]  Patient name: Louis Butler Medical record number: [id number] Date of birth: Nov 11, 1955 Age: 69 y.o. Gender: male  Primary Care Provider: Donah Laymon PARAS, MD Consultants: Nephrology, RD, palliative Code Status: Full  Pt Overview and Major Events to Date:  12/31: Admitted for orthostasis and FTT in the setting of obstructive uropathy  Assessment and Plan:  Louis Butler is a 69 year old male with a history of ESRD on PD, TURP with indwelling Foley, admitted for failure to thrive, orthostasis, and urinary retention.  Hospital course has been complicated by persistent nausea with PD overnight and in the mornings.  Today patient seems slightly better than normal.  States he is not less nauseous but not more nauseous either, and was able to tolerate his entire breakfast without vomiting.  Discharge barriers: Inability to tolerate PD with persistent nausea/vomiting. Assessment & Plan ESRD on peritoneal dialysis [facility]) Urinary retention with chronic foley - Nephrology following, managing electrolytes and peritoneal dialysis. Appreciate their recommendations and input - Continue nightly PD - Continue Auryxia  210 mg TID  - Continue Flomax  0.4 mg daily - Daily RFP  Nausea Did well with overnight Reglan .  Concern for possible gastroparesis which would make Reglan  first-line choice. - Scheduled Reglan  twice daily before breakfast and before dinner - CT head as below - Consider GI consult - Do not suspect gallstones as cause given absence of pain  Headache With complaint of headache this morning and persistent nausea, concern for subdural hematoma. - CT head without contrast Failure to thrive in adult Dizziness Orthostatic hypotension - Continue 10 mg midodrine  TID per nephrology  - RD consulted, appreciate recommendations:  - Prosource feeding supplement BID  - Renal multivitamin daily  - Oral thiamine  daily for 7 days (1/1-7) -  Continue to work with PT/OT - Fall precautions Irregular heart rhythm NSR since on continuous telemetry. - Discontinue telemetry at this time - AM EKG, monitor QTc Chronic health problem Hx TURP, BPH: Continue home tamsulosin  GERD: Continue home Protonix  Neuropathy: gabapentin  to 300 BID, home cymbalta   Mood: Continue home Zoloft  100 daily  FEN/GI: Regular diet with 1200 mL liter fluid restriction PPx: Heparin  Dispo:Home with home health pending clinical improvement .  Subjective:  Patient was seen and examined at bedside.  He is resting comfortably in the hospital bed.  He states he is nauseous but was able to eat all of his breakfast without difficulty and did not vomit.  He does not feel any worse than he has during the hospital course.  Objective: Temp:  [98.1 F (36.7 C)-98.3 F (36.8 C)] 98.3 F (36.8 C) (01/05 0555) Pulse Rate:  [71-80] 72 (01/05 0555) Resp:  [17] 17 (01/05 0555) BP: (102-107)/(48-51) 102/49 (01/05 0555) SpO2:  [96 %-97 %] 96 % (01/05 0555) Weight:  [121.1 kg] 121.1 kg (01/05 0500) Physical Exam: General: Chronically ill-appearing male resting comfortably in hospital bed, in no acute distress Cardiovascular: RRR, no M/R/G Respiratory: Normal work of breathing on room air, CTAB, no W/R/R Abdomen: Soft, nondistended, nontender, BS present Extremities: No peripheral edema, moves all extremities equally  Laboratory: Most recent CBC Lab Results  Component Value Date   WBC 6.9 04/11/2024   HGB 7.7 (L) 04/11/2024   HCT 23.2 (L) 04/11/2024   MCV 99.6 04/11/2024   PLT 185 04/11/2024   Most recent BMP    Latest Ref Rng & Units 04/11/2024    4:25 AM  BMP  Glucose 70 - 99 mg/dL  151   BUN 8 - 23 mg/dL 43   Creatinine 9.38 - 1.24 mg/dL 89.79   Sodium 864 - 854 mmol/L 126   Potassium 3.5 - 5.1 mmol/L 3.2   Chloride 98 - 111 mmol/L 90   CO2 22 - 32 mmol/L 24   Calcium  8.9 - 10.3 mg/dL 7.3    [name], DO 04/11/2024, 8:18 AM  PGY-1, [place]  [facility] FPTS Intern pager: [phone], text pages welcome Secure chat group [facility] Teaching Service   "

## 2024-04-11 NOTE — Assessment & Plan Note (Addendum)
"  Did well with overnight Reglan .  Concern for possible gastroparesis which would make Reglan  first-line choice. - Scheduled Reglan  twice daily before breakfast and before dinner - CT head as below - Consider GI consult - Do not suspect gallstones as cause given absence of pain "

## 2024-04-12 ENCOUNTER — Inpatient Hospital Stay (HOSPITAL_COMMUNITY)

## 2024-04-12 DIAGNOSIS — Z992 Dependence on renal dialysis: Secondary | ICD-10-CM | POA: Diagnosis not present

## 2024-04-12 DIAGNOSIS — N186 End stage renal disease: Secondary | ICD-10-CM | POA: Diagnosis not present

## 2024-04-12 LAB — RENAL FUNCTION PANEL
Albumin: 2.8 g/dL — ABNORMAL LOW (ref 3.5–5.0)
Anion gap: 11 (ref 5–15)
BUN: 49 mg/dL — ABNORMAL HIGH (ref 8–23)
CO2: 25 mmol/L (ref 22–32)
Calcium: 7.6 mg/dL — ABNORMAL LOW (ref 8.9–10.3)
Chloride: 93 mmol/L — ABNORMAL LOW (ref 98–111)
Creatinine, Ser: 10 mg/dL — ABNORMAL HIGH (ref 0.61–1.24)
GFR, Estimated: 5 mL/min — ABNORMAL LOW
Glucose, Bld: 171 mg/dL — ABNORMAL HIGH (ref 70–99)
Phosphorus: 3.1 mg/dL (ref 2.5–4.6)
Potassium: 4.2 mmol/L (ref 3.5–5.1)
Sodium: 129 mmol/L — ABNORMAL LOW (ref 135–145)

## 2024-04-12 MED ORDER — METOCLOPRAMIDE HCL 5 MG/ML IJ SOLN
5.0000 mg | Freq: Two times a day (BID) | INTRAMUSCULAR | Status: DC
  Administered 2024-04-12 – 2024-04-13 (×2): 5 mg via INTRAVENOUS
  Filled 2024-04-12 (×2): qty 2

## 2024-04-12 MED ORDER — CAPSAICIN 0.025 % EX CREA
TOPICAL_CREAM | Freq: Two times a day (BID) | CUTANEOUS | Status: DC | PRN
  Filled 2024-04-12: qty 60

## 2024-04-12 MED ORDER — DULOXETINE HCL 60 MG PO CPEP
60.0000 mg | ORAL_CAPSULE | Freq: Every day | ORAL | Status: DC
  Administered 2024-04-13 – 2024-04-18 (×6): 60 mg via ORAL
  Filled 2024-04-12 (×6): qty 1

## 2024-04-12 MED ORDER — METOCLOPRAMIDE HCL 5 MG/ML IJ SOLN: 5.0000 mg | Freq: Two times a day (BID) | INTRAMUSCULAR | Status: DC

## 2024-04-12 NOTE — Progress Notes (Signed)
 This RN called MRI checking to see when pt can go down. MRI stated he will be taken down soon. Dialysis was wanting to know, regarding PD hook up for tonight.   Bari HERO Bryanah Sidell

## 2024-04-12 NOTE — Progress Notes (Signed)
 PD tx initation note:   PD treatment initiated via aseptic technique. Consent signed and in chart. Patient is alert and oriented. No complaints of pain. No specimen collected. PD exit site clean, dry and intact. Gentamycin and new dressing applied. Bedside RN educated on PD machine and how to contact tech support when PD machine alarms.PD tx initation note:    04/12/24 2013  Peritoneal Catheter Mid lower abdomen  No placement date or time found.   Catheter Location: Mid lower abdomen  Site Assessment Clean, Dry, Intact  Drainage Description None  Catheter status Accessed  Dressing Gauze/Drain sponge  Dressing Status Clean, Dry, Intact  Dressing Intervention New dressing/dressing changed  Cycler Setup  Total Number of Night Cycles 5  Night Fill Volume 2500  Dianeal  Solution Dextrose  2.5% in 6000 mL Low Cal/Low Mag  Night Dwell Time per Cycle - Hour(s) 1  Night Dwell Time per Cycle - Minute(s) 30  Night Time Therapy - Minute(s) 27  Night Time Therapy - Hour(s) 10  Minimum Initial Drain Volume 0  Maximum Peritoneal Volume 3750  Night/Total Therapy Volume 87499  Day Exchange No  Hand-off documentation  Hand-off Given Given to shift RN/LPN  Report given to (Full Name) Bari Lor, RN

## 2024-04-12 NOTE — Assessment & Plan Note (Addendum)
-   Continue 10 mg midodrine  TID per nephrology  - RD consulted, appreciate recommendations:  - Prosource feeding supplement BID  - Renal multivitamin daily  - Oral thiamine  daily for 7 days (1/1-7) - Continue to work with PT/OT - Fall precautions

## 2024-04-12 NOTE — Progress Notes (Signed)
 PT Cancellation Note  Patient Details Name: Louis Butler MRN: [id number] DOB: 19-Feb-1956   Cancelled Treatment:    Reason Eval/Treat Not Completed: Medical issues which prohibited therapy (Pt still on peritoneal HD.  Nurse in room and pt is not feeling well and note that MRI pending to check for SDH.  Will HOLD PT today until results from MRI are in and MD confirms that PT can continue.)   [name] 04/12/2024, 9:38 AM [name] M,PT Acute Rehab Services [phone]

## 2024-04-12 NOTE — Progress Notes (Addendum)
 " [facility] Progress Note   Subjective:  Seen in room - neuropathy was bothering him last night and didn't sleep well. No CP/dyspnea today. PD went fine - net UF 1.4L. Head CT showed dural mass, ?meningioma.  Objective Vitals:   04/11/24 1949 04/12/24 0500 04/12/24 0505 04/12/24 0827  BP: (!) 97/51  (!) 104/59 (!) 95/49  Pulse: 75  91 91  Resp: 17  17   Temp: 97.6 F (36.4 C)  98.8 F (37.1 C) 98.3 F (36.8 C)  TempSrc:   Oral Oral  SpO2: 94%  95% 97%  Weight:  97.1 kg    Height:       Physical Exam General: Well appearing, older man, NAD. Room air Heart: RRR Lungs: CTAB, no rales Abdomen: soft, non-tender Extremities: no LE edema Dialysis Access:  PD cath  Additional Objective Labs: Basic Metabolic Panel: Recent Labs  Lab 04/10/24 0558 04/11/24 0425 04/12/24 0404  NA 127* 126* 129*  K 4.1 3.2* 4.2  CL 90* 90* 93*  CO2 26 24 25   GLUCOSE 137* 151* 171*  BUN 43* 43* 49*  CREATININE 10.80* 10.20* 10.00*  CALCIUM  7.4* 7.3* 7.6*  PHOS 4.5 4.6 3.1   Liver Function Tests: Recent Labs  Lab 04/05/24 2032 04/07/24 0937 04/10/24 0558 04/11/24 0425 04/12/24 0404  AST 14*  --   --   --   --   ALT <5  --   --   --   --   ALKPHOS 89  --   --   --   --   BILITOT 0.5  --   --   --   --   PROT 8.2*  --   --   --   --   ALBUMIN  3.3*   < > 2.9* 2.8* 2.8*   < > = values in this interval not displayed.   CBC: Recent Labs  Lab 04/07/24 0937 04/07/24 2134 04/08/24 0353 04/10/24 0558 04/11/24 0425  WBC 7.4 7.4 7.2 7.9 6.9  HGB 7.7* 7.6* 7.7* 7.8* 7.7*  HCT 22.6* 22.4* 22.0* 23.4* 23.2*  MCV 96.6 97.4 96.1 100.0 99.6  PLT 185 200 185 200 185   Studies/Results: CT HEAD WO CONTRAST ( ) Result Date: 04/11/2024 EXAM: CT HEAD WITHOUT 04/11/2024 12:57:00 PM TECHNIQUE: CT of the head was performed without the administration of intravenous contrast. Automated exposure control, iterative reconstruction, and/or weight based adjustment of the mA/kV was  utilized to reduce the radiation dose to as low as reasonably achievable. COMPARISON: None available. CLINICAL HISTORY: Headache, uncomplicated (Ped 0-17y) FINDINGS: BRAIN AND VENTRICLES: No acute intracranial hemorrhage. Approximately 2.1 x 2.4 cm extra-axial dural-based mass along the anterior/inferior right temporal convexity. Mild mass effect. No extra-axial fluid collection. No evidence of acute infarct. No hydrocephalus. Frontal predominant cerebral atrophy. ORBITS: No acute abnormality. SINUSES AND MASTOIDS: No acute abnormality. SOFT TISSUES AND SKULL: No acute skull fracture. Left mastoidectomy. IMPRESSION: 1. Approximately 2.4 cm extra-axial dural-based mass along the anterior/inferior right temporal convexity, most likely a meningioma but incompletely characterized. Recommended MRI head with contrast. 2. No acute intracranial abnormality. Electronically signed by: Gilmore Molt 04/11/2024 10:12 PM EST RP Workstation: [id number]   US  Abdomen Limited RUQ (LIVER/GB) Result Date: 04/10/2024 CLINICAL DATA:  Nausea, vomiting EXAM: ULTRASOUND ABDOMEN LIMITED RIGHT UPPER QUADRANT COMPARISON:  April 06, 2024 FINDINGS: Gallbladder: Mild cholelithiasis is noted with largest calculus measuring 8 mm. No sonographic Murphy's sign is noted. No significant gallbladder wall thickening is noted. Common bile duct: Diameter: 3 mm  which is within normal limits. Liver: No focal lesion identified. Within normal limits in parenchymal echogenicity. Portal vein is patent on color Doppler imaging with normal direction of blood flow towards the liver. Other: Right hydronephrosis is noted as described on prior CT scan. IMPRESSION: 1. Mild cholelithiasis without evidence of cholecystitis. 2. Right hydronephrosis is noted as described on prior CT scan. Electronically Signed   By: Lynwood Landy Raddle M.D.   On: 04/10/2024 17:53   Medications:  dialysis solution 2.5% low-MG/low-CA      (feeding supplement) PROSource Plus  30 mL  Oral BID BM   [START ON 04/13/2024] DULoxetine   60 mg Oral Daily   ferric citrate   210 mg Oral TID WC   gabapentin   300 mg Oral QHS   gentamicin  cream  1 Application Topical Daily   heparin   5,000 Units Subcutaneous Q8H   midodrine   10 mg Oral TID WC   multivitamin  1 tablet Oral QHS   polyethylene glycol  17 g Oral Daily   potassium chloride   20 mEq Oral Daily   sertraline   100 mg Oral Daily   tamsulosin   0.4 mg Oral Daily   thiamine   100 mg Oral Daily    Dialysis Orders [facility]CPD - [facility], Dr. Tobie is his primary nephrologist 5 exchanges, 2.5L fill volume, EDW 90kg - Mircera 225mcg sq monthly, last 12/17   Assessment/Plan: UTI with urinary obstruction/hydronephrosis: Prior indwelling foley, accidentally pulled out last week - now has been replaced. Initially on abx - now stopped. Urine Cx 12/31 negative. Hyponatremia: Given 0.5L IVF bolus on admit. Likely a little overloaded at this time - improving. Nausea: On antiemetics, will change binder to Auryxia  - see if helps. ?gastroparesis - improving. Head CT with ?meningioma ESRD:  On PD at home, Na remains low - continue all 2.5% bags tonight. Hypokalemia: Recurrent, continue  Kcl 20mEq daily. Hypotension/volume: BP low on admission, not on antihypertensives at home. Started on midodrine  10mg  TID.  Anemia: Hgb 9.5 -> 7.7. Aranesp  200mcg given 1/2 - follow.  Metabolic bone disease: CorrCa ok, follow. Phos better, have changed binder to Auryxia . Refuses renal diet.  Nutrition:  Alb low, continue supplements.  GOC: S/p palliative care visit, wants to continue full scope/full code   Izetta Boehringer, PA-C 04/12/2024, 12:09 PM  [facility]    "

## 2024-04-12 NOTE — Progress Notes (Signed)
 Nutrition Follow-up  DOCUMENTATION CODES:   Non-severe (moderate) malnutrition in context of chronic illness  INTERVENTION:  Continue ProSource Plus BID; each supplement provides 100 kcal and 15 g protein  Continue daily RenaVit  Encouraged adequate intake of regular diet to meet calorie and protein needs Refuses all ONS  NUTRITION DIAGNOSIS:   Moderate Malnutrition related to chronic illness as evidenced by moderate muscle depletion, moderate fat depletion. Diagnosis updated 1/6  GOAL:   Patient will meet greater than or equal to 90% of their needs Progressing  MONITOR:   PO intake, Supplement acceptance  REASON FOR ASSESSMENT:   Consult Assessment of nutrition requirement/status  ASSESSMENT:   Patient presented with malaise, dizziness and dislodging of indwelling Foley and was found to have UTI, failure to thrive, orthostatic hypotension and constipation. PMH significant for DM2, ESRD previously on HD transitioned to PD fall of 2025 due to difficulty with HD adherence.  Spoke with pt and pt's wife at bedside. Pt reports continued nausea but no longer vomiting. Pt reports he has been tolerating PD treatments although he gets nauseated during the night throughout the treatments. Pt ate 100% of breakfast with no issues. Endorses eating well at breakfast all days but other meals throughout the day vary intake wise. Pt has been working hard to eat more during the day.   [name] wife states pt routinely deals with nausea at home during PD treatments and throughout the day regularly so this has been ongoing issue.   Weight trends during admission fluctuating greatly which makes it difficult to interpret. Documented weights show +/- 50 lbs 1 day apart, suspect error in weighing or bedscale.   Nutrition focused physical exam shows moderate muscle and moderate fat depletions, indicative of malnutrition. Suspect related to ongoing chronic conditions and ESRD. Diagnosis upgraded and  will continue to monitor PO intake to meet calorie and protein needs  Pt agreeable to continue ProSource BID.   Average Meal Completion: 1/2-1/5: 75-100% average intake x 8 recorded meals **inconsistent meal documentation**  Nutritionally Relevant Medications: Midodrine  TID RenaVit Miralax  Potassium chloride  20 mEq daily Zoloft   Labs reviewed: Sodium 129/ Cl 93 BUN 49/Cr 10 Corrected calcium  8.56 (low)  Weight Trends: 12/30 89.4 kg 01/01 88.6 kg 01/03 110 kg 01/05 121.1 kg 01/06 97.1 kg  NUTRITION - FOCUSED PHYSICAL EXAM:  Flowsheet Row Most Recent Value  Orbital Region Moderate depletion  Upper Arm Region Moderate depletion  Thoracic and Lumbar Region Moderate depletion  Buccal Region Moderate depletion  Temple Region Moderate depletion  Clavicle Bone Region Moderate depletion  Clavicle and Acromion Bone Region Moderate depletion  Scapular Bone Region Moderate depletion  Dorsal Hand Moderate depletion  Patellar Region Severe depletion  Anterior Thigh Region Severe depletion  Posterior Calf Region Severe depletion  Edema (RD Assessment) Mild  Hair Reviewed  Eyes Reviewed  Mouth Reviewed  Skin Reviewed  Nails Reviewed    Diet Order:   Diet Order             Diet regular Room service appropriate? Yes; Fluid consistency: Thin; Fluid restriction: 1200 mL Fluid  Diet effective now                   EDUCATION NEEDS:   Education needs have been addressed  Skin:  Skin Assessment: Reviewed RN Assessment  Last BM:  1/5  Height:   Ht Readings from Last 1 Encounters:  04/05/24 6' 3 (1.905 m)    Weight:   Wt Readings from Last 1 Encounters:  04/12/24 97.1 kg    Ideal Body Weight:  89 kg  BMI:  Body mass index is 26.75 kg/m.  Estimated Nutritional Needs:   Kcal:  2300-2500  Protein:  130-150  Fluid:  >/=2300    Josette Glance, MS, RDN, LDN Clinical Dietitian I Please reach out via secure chat

## 2024-04-12 NOTE — Assessment & Plan Note (Addendum)
 Suspect meningioma on CT causing nausea. - Brain MRI today

## 2024-04-12 NOTE — Assessment & Plan Note (Addendum)
 Stable, no recurrences. - AM EKG Qtc 439

## 2024-04-12 NOTE — Plan of Care (Signed)

## 2024-04-12 NOTE — Assessment & Plan Note (Addendum)
"-   Nephrology following, managing electrolytes and peritoneal dialysis. Appreciate their recommendations and input - Continue nightly PD - Continue Auryxia  210 mg TID  - Continue Flomax  0.4 mg daily - Daily RFP "

## 2024-04-12 NOTE — Progress Notes (Addendum)
 "    Daily Progress Note Intern Pager: [phone]  Patient name: Louis Butler Medical record number: [id number] Date of birth: [birth year] Age: 69 y.o. Gender: male  Primary Care Provider: Donah Laymon PARAS, MD Consultants: Nephrology, RD, palliative Code Status: Full  Pt Overview and Major Events to Date:  12/31: Admitted for orthostasis and FTT in the setting of obstructive uropathy  Assessment and Plan:  Louis Butler is a 69 year old male with a history of ESRD on PD, TURP with indwelling Foley, admitted for failure to thrive, orthostasis, and urinary retention.   Hospital course has been complicated by persistent nausea with PD overnight and in the mornings.  Doing well on Reglan  regimen BID before breakfast and dinner, able to tolerate his entire breakfast without vomiting again today. Suspect meningioma on CT, will obtain MRI.  Assessment & Plan ESRD on peritoneal dialysis [facility]) Urinary retention with chronic foley - Nephrology following, managing electrolytes and peritoneal dialysis. Appreciate their recommendations and input - Continue nightly PD - Continue Auryxia  210 mg TID  - Continue Flomax  0.4 mg daily - Daily RFP  Nausea - Continue IV reglan , switch to PO before meals if continues to tolerate well, monitor for side effects  Headache Suspect meningioma on CT causing nausea. - Brain MRI today Failure to thrive in adult Dizziness Orthostatic hypotension - Continue 10 mg midodrine  TID per nephrology  - RD consulted, appreciate recommendations:  - Prosource feeding supplement BID  - Renal multivitamin daily  - Oral thiamine  daily for 7 days (1/1-7) - Continue to work with PT/OT - Fall precautions Irregular heart rhythm Stable, no recurrences. - AM EKG Qtc 439 Chronic health problem Hx TURP, BPH: Continue home tamsulosin  GERD: Continue home Protonix  Neuropathy: gabapentin  to 300 BID, home cymbalta   Mood: Continue home Zoloft  100 daily  FEN/GI:  Regular PPx: Heparin  Dispo:Home pending clinical improvement .  Subjective:  Patient was seen and examined at bedside. Wife is at bedside today. Patient states his nausea is so-so not worse than normal. Able to finish all his breakfast without vomiting this AM.  Objective: Temp:  [97.6 F (36.4 C)-98.8 F (37.1 C)] 98.8 F (37.1 C) (01/06 0505) Pulse Rate:  [72-91] 91 (01/06 0505) Resp:  [17-18] 17 (01/06 0505) BP: (97-106)/(51-59) 104/59 (01/06 0505) SpO2:  [94 %-96 %] 95 % (01/06 0505) Weight:  [97.1 kg] 97.1 kg (01/06 0500) Physical Exam: General: Chronically ill-appearing male resting comfortably in hospital bed, in no acute distress Cardiovascular: RRR, no M/R/G Respiratory: Normal work of breathing on room air, CTAB, no W/R/R Abdomen: Soft, nondistended, nontender, BS present Extremities: No peripheral edema, moves all extremities equally  Laboratory: Most recent CBC Lab Results  Component Value Date   WBC 6.9 04/11/2024   HGB 7.7 (L) 04/11/2024   HCT 23.2 (L) 04/11/2024   MCV 99.6 04/11/2024   PLT 185 04/11/2024   Most recent BMP    Latest Ref Rng & Units 04/12/2024    4:04 AM  BMP  Glucose 70 - 99 mg/dL 828   BUN 8 - 23 mg/dL 49   Creatinine 9.38 - 1.24 mg/dL 89.99   Sodium 864 - 854 mmol/L 129   Potassium 3.5 - 5.1 mmol/L 4.2   Chloride 98 - 111 mmol/L 93   CO2 22 - 32 mmol/L 25   Calcium  8.9 - 10.3 mg/dL 7.6     [name], DO 04/12/2024, 7:47 AM  PGY-1, [facility] FPTS Intern pager: [phone], text pages welcome Secure chat group  [facility] Teaching Service   "

## 2024-04-12 NOTE — Assessment & Plan Note (Addendum)
"-   Continue IV reglan , switch to PO before meals if continues to tolerate well, monitor for side effects "

## 2024-04-12 NOTE — Assessment & Plan Note (Addendum)
 Hx TURP, BPH: Continue home tamsulosin  GERD: Continue home Protonix  Neuropathy: gabapentin  to 300 BID, home cymbalta   Mood: Continue home Zoloft  100 daily

## 2024-04-13 ENCOUNTER — Inpatient Hospital Stay (HOSPITAL_COMMUNITY)

## 2024-04-13 DIAGNOSIS — R519 Headache, unspecified: Secondary | ICD-10-CM

## 2024-04-13 DIAGNOSIS — G8929 Other chronic pain: Secondary | ICD-10-CM

## 2024-04-13 DIAGNOSIS — N186 End stage renal disease: Secondary | ICD-10-CM | POA: Diagnosis not present

## 2024-04-13 DIAGNOSIS — R627 Adult failure to thrive: Secondary | ICD-10-CM | POA: Diagnosis not present

## 2024-04-13 DIAGNOSIS — E871 Hypo-osmolality and hyponatremia: Secondary | ICD-10-CM | POA: Diagnosis not present

## 2024-04-13 DIAGNOSIS — Z992 Dependence on renal dialysis: Secondary | ICD-10-CM | POA: Diagnosis not present

## 2024-04-13 LAB — RENAL FUNCTION PANEL
Albumin: 2.9 g/dL — ABNORMAL LOW (ref 3.5–5.0)
Anion gap: 11 (ref 5–15)
BUN: 51 mg/dL — ABNORMAL HIGH (ref 8–23)
CO2: 25 mmol/L (ref 22–32)
Calcium: 7.7 mg/dL — ABNORMAL LOW (ref 8.9–10.3)
Chloride: 95 mmol/L — ABNORMAL LOW (ref 98–111)
Creatinine, Ser: 9.6 mg/dL — ABNORMAL HIGH (ref 0.61–1.24)
GFR, Estimated: 5 mL/min — ABNORMAL LOW
Glucose, Bld: 145 mg/dL — ABNORMAL HIGH (ref 70–99)
Phosphorus: 2.9 mg/dL (ref 2.5–4.6)
Potassium: 3.9 mmol/L (ref 3.5–5.1)
Sodium: 130 mmol/L — ABNORMAL LOW (ref 135–145)

## 2024-04-13 MED ORDER — METOCLOPRAMIDE HCL 5 MG PO TABS
5.0000 mg | ORAL_TABLET | Freq: Two times a day (BID) | ORAL | Status: DC
  Administered 2024-04-13 – 2024-04-18 (×10): 5 mg via ORAL
  Filled 2024-04-13 (×10): qty 1

## 2024-04-13 MED ORDER — HYDROXYZINE HCL 25 MG PO TABS
25.0000 mg | ORAL_TABLET | Freq: Three times a day (TID) | ORAL | Status: DC | PRN
  Administered 2024-04-13 – 2024-04-18 (×11): 25 mg via ORAL
  Filled 2024-04-13 (×11): qty 1

## 2024-04-13 NOTE — Assessment & Plan Note (Addendum)
-   Continue 10 mg midodrine  TID per nephrology  - RD consulted, appreciate recommendations:  - Prosource feeding supplement BID  - Renal multivitamin daily  - Oral thiamine  daily for 7 days (1/1-7) - Continue to work with PT/OT - Fall precautions

## 2024-04-13 NOTE — Assessment & Plan Note (Addendum)
 Hx TURP, BPH: Continue home tamsulosin  GERD: Continue home Protonix  Neuropathy: gabapentin  to 300 BID, home cymbalta   Mood: Continue home Zoloft  100 daily

## 2024-04-13 NOTE — Progress Notes (Signed)
 PT Cancellation Note  Patient Details Name: Louis Butler MRN: [id number] DOB: Nov 26, 1955   Cancelled Treatment:    Reason Eval/Treat Not Completed: Patient and family declined PT session due to R arm itchiness and feeling weak. Discussed that PT works to address weakness/deficits with pt/family continuing to decline. Will follow up and re-attempt as able.  Kate ORN, PT, DPT Secure Chat Preferred  Rehab Office [phone]   Kate BRAVO Wendolyn 04/13/2024, 3:41 PM

## 2024-04-13 NOTE — Plan of Care (Signed)

## 2024-04-13 NOTE — Progress Notes (Signed)
 " [facility] Progress Note   Subjective:    Seen and examined patient at bedside. Patient's wife also at bedside. Plan for PD tonight.  Objective Vitals:   04/13/24 0458 04/13/24 0742 04/13/24 1026 04/13/24 1634  BP: (!) 107/47 (!) 109/49 (!) 106/58 (!) 125/58  Pulse: 76 76 85 88  Resp: 17 18 18 18   Temp: 98.2 F (36.8 C) 98.5 F (36.9 C)  98.3 F (36.8 C)  TempSrc: Oral Oral  Oral  SpO2: 94% 95% 98% 96%  Weight:      Height:       Physical Exam General: Well appearing, older man, NAD. Room air Heart: RRR Lungs: CTAB, no rales Abdomen: soft, non-tender Extremities: no LE edema Dialysis Access:  PD cath  [facility] Weights   04/11/24 0500 04/12/24 0500 04/13/24 0457  Weight: 121.1 kg 97.1 kg 99 kg    Intake/Output Summary (Last 24 hours) at 04/13/2024 1716 Last data filed at 04/13/2024 0457 Gross per 24 hour  Intake 120 ml  Output 250 ml  Net -130 ml    Additional Objective Labs: Basic Metabolic Panel: Recent Labs  Lab 04/11/24 0425 04/12/24 0404 04/13/24 0331  NA 126* 129* 130*  K 3.2* 4.2 3.9  CL 90* 93* 95*  CO2 24 25 25   GLUCOSE 151* 171* 145*  BUN 43* 49* 51*  CREATININE 10.20* 10.00* 9.60*  CALCIUM  7.3* 7.6* 7.7*  PHOS 4.6 3.1 2.9   Liver Function Tests: Recent Labs  Lab 04/11/24 0425 04/12/24 0404 04/13/24 0331  ALBUMIN  2.8* 2.8* 2.9*   No results for input(s): LIPASE, AMYLASE in the last 168 hours. CBC: Recent Labs  Lab 04/07/24 0937 04/07/24 2134 04/08/24 0353 04/10/24 0558 04/11/24 0425  WBC 7.4 7.4 7.2 7.9 6.9  HGB 7.7* 7.6* 7.7* 7.8* 7.7*  HCT 22.6* 22.4* 22.0* 23.4* 23.2*  MCV 96.6 97.4 96.1 100.0 99.6  PLT 185 200 185 200 185   Blood Culture    Component Value Date/Time   SDES URINE, CATHETERIZED 04/06/2024 1117   SPECREQUEST NONE 04/06/2024 1117   CULT  04/06/2024 1117    NO GROWTH Performed at [facility], [address], [place], [place] [address]    REPTSTATUS 04/07/2024 FINAL 04/06/2024  1117    Cardiac Enzymes: No results for input(s): CKTOTAL, CKMB, CKMBINDEX, TROPONINI in the last 168 hours. CBG: No results for input(s): GLUCAP in the last 168 hours. Iron  Studies: No results for input(s): IRON , TIBC, TRANSFERRIN, FERRITIN in the last 72 hours. Lab Results  Component Value Date   INR 1.0 06/13/2023   INR 1.3 (H) 04/29/2023   INR 1.0 08/04/2019   Studies/Results: MR BRAIN WO CONTRAST Result Date: 04/13/2024 EXAM: MRI BRAIN WITHOUT CONTRAST 04/13/2024 09:58:41 AM TECHNIQUE: Multiplanar multisequence MRI of the head/brain was performed without the administration of intravenous contrast. COMPARISON: CT head 04/11/2024 and earlier. CLINICAL HISTORY: 69 year old male with right middle cranial fossa mass on recent head CT, suspected meningioma. FINDINGS: BRAIN AND VENTRICLES: No acute infarct. No intracranial hemorrhage. A rounded mass in the medial aspect of the right middle cranial fossa is best demonstrated on T2 and FLAIR imaging (series 6 image 13), does appear extra axial, and is approximately 19 mm diameter. Mild mass effect on the adjacent right temporal lobe with no brain edema. No midline shift. No hydrocephalus. No other intracranial mass effect. Cavum septum pellucidum, normal variant. There is evidence of a small chronic lacunar infarct in the central left pons on series 8 image 13, series  9 image 15. Cerebellopontine angles appear normal. Otherwise gray and white matter signal is normal for age. No cortical encephalomalacia or chronic cerebral blood products are identified (T2*). Deep gray nuclei and cerebellum appear negative. The sella is unremarkable. Normal flow voids. ORBITS: No acute abnormality. SINUSES AND MASTOIDS: Sequelae of left mastoidectomy. BONES AND SOFT TISSUES: No acute soft tissue abnormality. Normal visible cervical spine and bone marrow signal. IMPRESSION: 1. Right middle cranial fossa mass is approximately 19 mm and does appears to be  extra-axial on this non-contrast exam. No cerebral edema. Favor Meningioma. Follow-up MRI both without and with contrast would be ideal for staging and treatment planning. 2. Mild chronic small vessel disease. 3. Left mastoidectomy. Electronically signed by: Helayne Hurst MD 04/13/2024 10:54 AM EST RP Workstation: [id number]    Medications:  dialysis solution 2.5% low-MG/low-CA      (feeding supplement) PROSource Plus  30 mL Oral BID BM   DULoxetine   60 mg Oral Daily   ferric citrate   210 mg Oral TID WC   gabapentin   300 mg Oral QHS   gentamicin  cream  1 Application Topical Daily   heparin   5,000 Units Subcutaneous Q8H   metoCLOPramide   5 mg Oral BID AC   midodrine   10 mg Oral TID WC   multivitamin  1 tablet Oral QHS   polyethylene glycol  17 g Oral Daily   potassium chloride   20 mEq Oral Daily   sertraline   100 mg Oral Daily   tamsulosin   0.4 mg Oral Daily    Dialysis Orders: [facility] - [facility], Dr. Tobie is his primary nephrologist 5 exchanges, 2.5L fill volume, EDW 90kg - Mircera 225mcg sq monthly, last 12/17  Assessment/Plan: UTI with urinary obstruction/hydronephrosis: Prior indwelling foley, accidentally pulled out last week - now has been replaced. Initially on abx - now stopped. Urine Cx 12/31 negative. Hyponatremia: Given 0.5L IVF bolus on admit. Likely a little overloaded at this time - current Na 130. Nausea: On antiemetics, will change binder to Auryxia  - see if helps. ?gastroparesis - improving. Head CT with ?meningioma ESRD:  On PD at home, Na remains low - continue all 2.5% bags tonight. Hypokalemia: Recurrent, continue  Kcl 20mEq daily. Hypotension/volume: BP low on admission, not on antihypertensives at home. Started on midodrine  10mg  TID.  Anemia: Hgb 9.5 -> 7.7. Aranesp  200mcg given 1/2 - follow.  Metabolic bone disease: CorrCa ok, follow. Phos better, have changed binder to Auryxia . Refuses renal diet.  Nutrition:  Alb low, continue supplements.  GOC: S/p palliative  care visit, wants to continue full scope/full code  Charmaine Piety, NP [facility] 04/13/2024,5:16 PM  LOS: 7 days    "

## 2024-04-13 NOTE — Assessment & Plan Note (Addendum)
-   Nephrology following, managing electrolytes and peritoneal dialysis. Appreciate their recommendations and input - Continue nightly PD - Continue Auryxia  210 mg TID  - Continue Flomax  0.4 mg daily - Lab holiday with plan to discharge tomorrow

## 2024-04-13 NOTE — Progress Notes (Signed)
 Peritoneal Dialysis Treatment Initiation Note  Pre TX VS:see table below   04/13/24 1900  Vitals  Temp 98 F (36.7 C)  Temp Source Oral  BP (!) 141/62  MAP (mmHg) 82  BP Location Right Arm  BP Method Automatic  Patient Position (if appropriate) Lying  Pulse Rate (!) 113  Pulse Rate Source Monitor  Resp 20  Level of Consciousness  Level of Consciousness Alert  MEWS COLOR  MEWS Score Color Yellow  Oxygen Therapy  SpO2 97 %  O2 Device Room Air  Patient Activity (if Appropriate) In bed  Pulse Oximetry Type Intermittent  Height and Weight  Weight 100 kg  Type of Scale Used Bed  Type of Weight Pre-Dialysis  BMI (Calculated) 27.56  MEWS Score  MEWS Temp 0  MEWS Systolic 0  MEWS Pulse 2  MEWS RR 0  MEWS LOC 0  MEWS Score 2    Pre TX weight:100 kg  Consent signed and in chart. PD treatment initiated via aseptic technique.  Patient is alert and oriented. No complaints of pain.   No specimen collected. PD exit site clean, dry and intact, reddish with dry scabs. Gentamycin and new dressing applied.   Hand-off given to the patient's nurse.  Bedside RN educated on PD machine and how to contact tech support when PD machine alarms.   Lorrene Glisson RN [facility]

## 2024-04-13 NOTE — Discharge Instructions (Addendum)
 Dear [name],   Thank you for letting us  participate in your care! You were hospitalized for your weakness and low blood pressures. We managed your medications to help you feel better and Nephrology continued to manage your dialysis. Additionally, we incidently found something called a meningioma on imaging you had done while in the hospital - this is not an emergency and you did not need any treatment for this while in the hospital. You should follow up outpatient for further evaluation and management.   POST-HOSPITAL & CARE INSTRUCTIONS We recommend following up with your PCP within 1 week from being discharged from the hospital. Please let PCP/Specialists know of any changes in medications that were made which you will be able to see in the medications section of this packet. Please follow up with your Nephrologist as instructed. You should follow up with Neurosurgery to discuss the meningioma seen on your MRI; discuss this with your PCP.  DOCTOR'S APPOINTMENTS & FOLLOW UP Future Appointments  Date Time Provider Department Center  04/21/2024 10:45 AM Larraine Palma, MD [facility]  05/09/2024  3:40 PM [facility] ANNUAL WELLNESS VISIT [facility]    Thank you for choosing [facility]! Take care and be well!  Family Medicine Teaching Service Inpatient Team [place]  [facility]  [address] [place], [place] [address] [phone]

## 2024-04-13 NOTE — Assessment & Plan Note (Addendum)
 Suspect meningioma on CT causing nausea. - Brain MRI done this AM, impression as below

## 2024-04-13 NOTE — Assessment & Plan Note (Addendum)
-   Transition IV Reglan  to PO before meals  - Consider discharging with Reglan  if tolerating

## 2024-04-13 NOTE — Assessment & Plan Note (Deleted)
 Stable, no recurrences. - AM EKG Qtc 439

## 2024-04-13 NOTE — Progress Notes (Signed)
 "    Daily Progress Note Intern Pager: [phone]  Patient name: Louis Butler Medical record number: [id number] Date of birth: 16-May-1955 Age: 69 y.o. Gender: male  Primary Care Provider: Donah Laymon PARAS, MD Consultants: Nephrology, RD, palliative Code Status: Full   Pt Overview and Major Events to Date:  12/31: Admitted for orthostasis and FTT in the setting of obstructive uropathy   Assessment and Plan:   Louis Butler is a 69 year old male with a history of ESRD on PD, TURP with indwelling Foley, admitted for failure to thrive, orthostasis, and urinary retention.   Hospital course has been complicated by persistent nausea with PD overnight and in the mornings.  Doing well on Reglan  regimen BID before breakfast and dinner, has been able to tolerate meals without vomiting over the last few days. Suspect cause from gastroparesis vs meningioma found on CT. Will further investigate meningioma with MRI today. Hopeful discharge tomorrow.  Assessment & Plan ESRD on peritoneal dialysis [facility]) Urinary retention with chronic foley - Nephrology following, managing electrolytes and peritoneal dialysis. Appreciate their recommendations and input - Continue nightly PD - Continue Auryxia  210 mg TID  - Continue Flomax  0.4 mg daily - Lab holiday with plan to discharge tomorrow  Nausea - Transition IV Reglan  to PO before meals  - Consider discharging with Reglan  if tolerating Headache Suspect meningioma on CT causing nausea. - Brain MRI done this AM, impression as below Failure to thrive in adult Dizziness Orthostatic hypotension - Continue 10 mg midodrine  TID per nephrology  - RD consulted, appreciate recommendations:  - Prosource feeding supplement BID  - Renal multivitamin daily  - Oral thiamine  daily for 7 days (1/1-7) - Continue to work with PT/OT - Fall precautions Irregular heart rhythm (Resolved: 04/13/2024)  Chronic health problem Hx TURP, BPH: Continue home  tamsulosin  GERD: Continue home Protonix  Neuropathy: gabapentin  to 300 BID, home cymbalta   Mood: Continue home Zoloft  100 daily  FEN/GI: Regular PPx: Heparin  Dispo:Home pending clinical improvement .  Subjective:  Patient was seen and examined at bedside. He seems to be in better spirits today and wife at bedside agrees. Patient states he feels a little better than usual and his nausea is not so bad. He is eating breakfast during the encounter and had not received his Reglan  yet.   Objective: Temp:  [98 F (36.7 C)-98.5 F (36.9 C)] 98.5 F (36.9 C) (01/07 0742) Pulse Rate:  [76-91] 76 (01/07 0742) Resp:  [17-18] 18 (01/07 0742) BP: (95-113)/(47-51) 109/49 (01/07 0742) SpO2:  [94 %-97 %] 95 % (01/07 0742) Weight:  [99 kg] 99 kg (01/07 0457) Physical Exam: General: Chronically ill-appearing male resting comfortably in hospital bed eating breakfast, in no acute distress Respiratory: Normal work of breathing on room air Extremities: No peripheral edema, moves all extremities equally  Laboratory: Most recent CBC Lab Results  Component Value Date   WBC 6.9 04/11/2024   HGB 7.7 (L) 04/11/2024   HCT 23.2 (L) 04/11/2024   MCV 99.6 04/11/2024   PLT 185 04/11/2024   Most recent BMP    Latest Ref Rng & Units 04/13/2024    3:31 AM  BMP  Glucose 70 - 99 mg/dL 854   BUN 8 - 23 mg/dL 51   Creatinine 9.38 - 1.24 mg/dL 0.39   Sodium 864 - 854 mmol/L 130   Potassium 3.5 - 5.1 mmol/L 3.9   Chloride 98 - 111 mmol/L 95   CO2 22 - 32 mmol/L 25   Calcium  8.9 - 10.3  mg/dL 7.7    Imaging/Diagnostic Tests: Brain MRI WO Contrast 01/07 Radiologist Impression:  1. Right middle cranial fossa mass is approximately 19 mm and does appears to be extra-axial on this non-contrast exam. No cerebral edema. Favor Meningioma. Follow-up MRI both without and with contrast would be ideal for staging and treatment planning. 2. Mild chronic small vessel disease. 3. Left mastoidectomy.  [name],  DO 04/13/2024, 8:09 AM  PGY-1, [facility] FPTS Intern pager: [phone], text pages welcome Secure chat group [facility] Teaching Service   "

## 2024-04-14 DIAGNOSIS — Z515 Encounter for palliative care: Secondary | ICD-10-CM | POA: Diagnosis not present

## 2024-04-14 DIAGNOSIS — N186 End stage renal disease: Secondary | ICD-10-CM | POA: Diagnosis not present

## 2024-04-14 DIAGNOSIS — Z992 Dependence on renal dialysis: Secondary | ICD-10-CM | POA: Diagnosis not present

## 2024-04-14 DIAGNOSIS — R627 Adult failure to thrive: Secondary | ICD-10-CM | POA: Diagnosis not present

## 2024-04-14 DIAGNOSIS — R531 Weakness: Secondary | ICD-10-CM | POA: Diagnosis not present

## 2024-04-14 LAB — RENAL FUNCTION PANEL
Albumin: 2.9 g/dL — ABNORMAL LOW (ref 3.5–5.0)
Anion gap: 10 (ref 5–15)
BUN: 51 mg/dL — ABNORMAL HIGH (ref 8–23)
CO2: 25 mmol/L (ref 22–32)
Calcium: 7.9 mg/dL — ABNORMAL LOW (ref 8.9–10.3)
Chloride: 96 mmol/L — ABNORMAL LOW (ref 98–111)
Creatinine, Ser: 9.2 mg/dL — ABNORMAL HIGH (ref 0.61–1.24)
GFR, Estimated: 6 mL/min — ABNORMAL LOW
Glucose, Bld: 182 mg/dL — ABNORMAL HIGH (ref 70–99)
Phosphorus: 2.3 mg/dL — ABNORMAL LOW (ref 2.5–4.6)
Potassium: 4 mmol/L (ref 3.5–5.1)
Sodium: 132 mmol/L — ABNORMAL LOW (ref 135–145)

## 2024-04-14 MED ORDER — DELFLEX-LC/1.5% DEXTROSE 344 MOSM/L IP SOLN: INTRAPERITONEAL | Status: DC

## 2024-04-14 MED ORDER — GENTAMICIN SULFATE 0.1 % EX CREA: 1.0000 | TOPICAL_CREAM | Freq: Every day | CUTANEOUS | Status: DC

## 2024-04-14 NOTE — Assessment & Plan Note (Signed)
-   Nephrology following, managing electrolytes and peritoneal dialysis. Appreciate their recommendations and input - Continue nightly PD - Continue Auryxia  210 mg TID  - Continue Flomax  0.4 mg daily - Lab holiday

## 2024-04-14 NOTE — Progress Notes (Signed)
 Occupational Therapy Treatment Patient Details Name: Louis Butler MRN: [id number] DOB: 16-May-1955 Today's Date: 04/14/2024   History of present illness Pt is a 69 y.o. M who presents 04/05/2024 with weakness, dizziness malaise. Significant PMH: ESRD on peritoneal dialysis, T2DM, HTN, CA external auditory canal and neck, MDD, diabetic neuropathy.   OT comments  Pt in bed upon arrival with wife present. Pt not very talkative and wife answering most questions and doing most of the talking. Pt and wife adamantly declined EOB and OOB activity. Pt with low BP earlier when working with PT and fearful of dizziness and BP drop if he got up again. Pt agreeable to bed mobility, grooming/hygiene tasks at bed level and he wife instructed on B UE ROM exercises to maintain joint mobility and decrease risk of joint stiffness/contractures. OT will continue to follow acutely to maximize level of function and safety      If plan is discharge home, recommend the following:  A little help with walking and/or transfers;A little help with bathing/dressing/bathroom;Assistance with cooking/housework;Assist for transportation;Help with stairs or ramp for entrance   Equipment Recommendations  None recommended by OT    Recommendations for Other Services      Precautions / Restrictions Precautions Precautions: Fall;Other (comment) Precaution/Restrictions Comments: orthostatic Restrictions Weight Bearing Restrictions Per Provider Order: No       Mobility Bed Mobility Overal bed mobility: Needs Assistance Bed Mobility: Rolling     Supine to sit: Contact guard Sit to supine: Contact guard assist   General bed mobility comments: Declined sittng EOB, agreeable to rolling using rails, no physical assist required    Transfers                   General transfer comment: declined     Balance                                           ADL either performed or assessed with clinical  judgement   ADL       Grooming: Wash/dry hands;Wash/dry face;Set up;Supervision/safety;Bed level                                 General ADL Comments: pt and wife  adamantly declined EOB and OOB activity. Pt with low BP earlier when working with PT. Pt and wife fearful of dizziness and BP drop if he got up again    Extremity/Trunk Assessment Upper Extremity Assessment Upper Extremity Assessment: Generalized weakness;LUE deficits/detail LUE Deficits / Details: pain at 90 degrees FF LUE: Shoulder pain with ROM   Lower Extremity Assessment Lower Extremity Assessment: Defer to PT evaluation        Vision Ability to See in Adequate Light: 0 Adequate Patient Visual Report: No change from baseline     Perception     Praxis     Communication Communication Communication: Impaired Factors Affecting Communication: Reduced clarity of speech   Cognition Arousal: Alert Behavior During Therapy: Flat affect Cognition: No apparent impairments                               Following commands: Intact        Cueing   Cueing Techniques: Verbal cues  Exercises Other Exercises Other Exercises: Pt and wife instructed  on B UE ROM exercises to maintain joint mobility and decrease risk of joint stiffness/contractures.    Shoulder Instructions       General Comments symptomatic orthostatic hypotension. Orthostatic vitals listed above    Pertinent Vitals/ Pain          Home Living                                          Prior Functioning/Environment              Frequency  Min 2X/week        Progress Toward Goals  OT Goals(current goals can now be found in the care plan section)  Progress towards OT goals: OT to reassess next treatment     Plan      Co-evaluation                 AM-PAC OT 6 Clicks Daily Activity     Outcome Measure   Help from another person eating meals?: None Help from another person  taking care of personal grooming?: A Little Help from another person toileting, which includes using toliet, bedpan, or urinal?: A Little Help from another person bathing (including washing, rinsing, drying)?: A Little Help from another person to put on and taking off regular upper body clothing?: None Help from another person to put on and taking off regular lower body clothing?: A Little 6 Click Score: 20    End of Session    OT Visit Diagnosis: Muscle weakness (generalized) (M62.81)   Activity Tolerance Patient limited by fatigue;Other (comment) (low BP earlier PT session)   Patient Left     Nurse Communication Mobility status        Time: 8670-8653 OT Time Calculation (min): 17 min  Charges: OT Treatments $Therapeutic Activity: 8-22 mins    Jacques Karna Loose 04/14/2024, 4:00 PM

## 2024-04-14 NOTE — Assessment & Plan Note (Signed)
-   Continue 10 mg midodrine  TID per nephrology  - RD consulted, appreciate recommendations:  - Prosource feeding supplement BID  - Renal multivitamin daily  - Oral thiamine  daily for 7 days (1/1-7) - Continue to work with PT/OT - Fall precautions

## 2024-04-14 NOTE — Progress Notes (Signed)
 " [facility] Progress Note   Subjective:    - seen in room - pt not very verbal, his family talks the most   Objective Vitals:   04/13/24 2002 04/14/24 0442 04/14/24 0444 04/14/24 0849  BP: 113/65 119/63  (!) 92/53  Pulse: 92 89  91  Resp: 18 17  18   Temp: 98 F (36.7 C) 98.1 F (36.7 C)  98.5 F (36.9 C)  TempSrc: Oral Oral  Oral  SpO2: 96% 97%  100%  Weight:   100 kg   Height:       Physical Exam General: Well appearing, older man, NAD. Room air Heart: RRR Lungs: CTAB, no rales Abdomen: soft, non-tender Extremities: no LE edema Dialysis Access:  PD cath  [facility] Weights   04/13/24 0457 04/13/24 1900 04/14/24 0444  Weight: 99 kg 100 kg 100 kg    Intake/Output Summary (Last 24 hours) at 04/14/2024 1537 Last data filed at 04/14/2024 0928 Gross per 24 hour  Intake 360 ml  Output 1744 ml  Net -1384 ml    Additional Objective Labs: Basic Metabolic Panel: Recent Labs  Lab 04/12/24 0404 04/13/24 0331 04/14/24 0502  NA 129* 130* 132*  K 4.2 3.9 4.0  CL 93* 95* 96*  CO2 25 25 25   GLUCOSE 171* 145* 182*  BUN 49* 51* 51*  CREATININE 10.00* 9.60* 9.20*  CALCIUM  7.6* 7.7* 7.9*  PHOS 3.1 2.9 2.3*   Liver Function Tests: Recent Labs  Lab 04/12/24 0404 04/13/24 0331 04/14/24 0502  ALBUMIN  2.8* 2.9* 2.9*   No results for input(s): LIPASE, AMYLASE in the last 168 hours. CBC: Recent Labs  Lab 04/07/24 2134 04/08/24 0353 04/10/24 0558 04/11/24 0425  WBC 7.4 7.2 7.9 6.9  HGB 7.6* 7.7* 7.8* 7.7*  HCT 22.4* 22.0* 23.4* 23.2*  MCV 97.4 96.1 100.0 99.6  PLT 200 185 200 185   Blood Culture    Component Value Date/Time   SDES URINE, CATHETERIZED 04/06/2024 1117   SPECREQUEST NONE 04/06/2024 1117   CULT  04/06/2024 1117    NO GROWTH Performed at [facility], [address] [place], [place] [address]    REPTSTATUS 04/07/2024 FINAL 04/06/2024 1117    Cardiac Enzymes: No results for input(s): CKTOTAL, CKMB, CKMBINDEX,  TROPONINI in the last 168 hours. CBG: No results for input(s): GLUCAP in the last 168 hours. Iron  Studies: No results for input(s): IRON , TIBC, TRANSFERRIN, FERRITIN in the last 72 hours. Lab Results  Component Value Date   INR 1.0 06/13/2023   INR 1.3 (H) 04/29/2023   INR 1.0 08/04/2019   Studies/Results: MR BRAIN WO CONTRAST Result Date: 04/13/2024 EXAM: MRI BRAIN WITHOUT CONTRAST 04/13/2024 09:58:41 AM TECHNIQUE: Multiplanar multisequence MRI of the head/brain was performed without the administration of intravenous contrast. COMPARISON: CT head 04/11/2024 and earlier. CLINICAL HISTORY: 69 year old male with right middle cranial fossa mass on recent head CT, suspected meningioma. FINDINGS: BRAIN AND VENTRICLES: No acute infarct. No intracranial hemorrhage. A rounded mass in the medial aspect of the right middle cranial fossa is best demonstrated on T2 and FLAIR imaging (series 6 image 13), does appear extra axial, and is approximately 19 mm diameter. Mild mass effect on the adjacent right temporal lobe with no brain edema. No midline shift. No hydrocephalus. No other intracranial mass effect. Cavum septum pellucidum, normal variant. There is evidence of a small chronic lacunar infarct in the central left pons on series 8 image 13, series 9 image 15. Cerebellopontine angles appear normal. Otherwise gray  and white matter signal is normal for age. No cortical encephalomalacia or chronic cerebral blood products are identified (T2*). Deep gray nuclei and cerebellum appear negative. The sella is unremarkable. Normal flow voids. ORBITS: No acute abnormality. SINUSES AND MASTOIDS: Sequelae of left mastoidectomy. BONES AND SOFT TISSUES: No acute soft tissue abnormality. Normal visible cervical spine and bone marrow signal. IMPRESSION: 1. Right middle cranial fossa mass is approximately 19 mm and does appears to be extra-axial on this non-contrast exam. No cerebral edema. Favor Meningioma. Follow-up  MRI both without and with contrast would be ideal for staging and treatment planning. 2. Mild chronic small vessel disease. 3. Left mastoidectomy. Electronically signed by: Helayne Hurst MD 04/13/2024 10:54 AM EST RP Workstation: [id number]    Medications:  dialysis solution 2.5% low-MG/low-CA      (feeding supplement) PROSource Plus  30 mL Oral BID BM   DULoxetine   60 mg Oral Daily   ferric citrate   210 mg Oral TID WC   gabapentin   300 mg Oral QHS   gentamicin  cream  1 Application Topical Daily   heparin   5,000 Units Subcutaneous Q8H   metoCLOPramide   5 mg Oral BID AC   midodrine   10 mg Oral TID WC   multivitamin  1 tablet Oral QHS   polyethylene glycol  17 g Oral Daily   potassium chloride   20 mEq Oral Daily   sertraline   100 mg Oral Daily   tamsulosin   0.4 mg Oral Daily    Dialysis Orders: [facility] - [facility], Dr. Tobie is his primary nephrologist 5 exchanges, 2.5L fill volume, EDW 90kg - Mircera 225mcg sq monthly, last 12/17  Assessment/Plan: UTI with urinary obstruction/hydronephrosis: Prior indwelling foley, accidentally pulled out last week - now has been replaced. Initially on abx - now stopped. Urine Cx 12/31 negative.  Hyponatremia: 132 today, will lower UF to 1.5% tonight, watch Na+  Nausea: On antiemetics, will change binder to Auryxia  - see if helps. ?gastroparesis - improving. Head CT with ?meningioma ESRD:  On PD at home. Lower UF to 1.5% for a day, watch how Na+ reacts.  Hypokalemia: Recurrent, continue  Kcl 20mEq daily. Hypotension/volume: BP low on admission, not on antihypertensives at home. Started on midodrine  10mg  TID. Not sure any of the weights are accurate. Only CXR 12/31 was clear, no edema/ congestion.   Anemia: Hgb 9.5 -> 7.7. Aranesp  200mcg given 1/2 - follow.  Metabolic bone disease: CorrCa ok, follow. Phos better, have changed binder to Auryxia . Refuses renal diet.  Nutrition:  Alb low, continue supplements.  GOC: S/p palliative care visit, wants to continue  full scope/full code  Myer Fret  MD  [facility] 04/14/2024, 3:39 PM         "

## 2024-04-14 NOTE — Progress Notes (Signed)
 " Daily Progress Note   Patient Name: Louis Butler       Date: 04/14/2024 DOB: 02/07/1956  Age: 69 y.o. MRN#: [id number] Attending Physician: Anders Otto DASEN, MD Primary Care Physician: Donah Laymon PARAS, MD Admit Date: 04/05/2024 Length of Stay: 8 days  Reason for Follow-up: Establishing goals of care  Subjective:   CC: Establishing goals of care  Subjective:  Chart reviewed including personal review of most recent labs noted for sodium 130, creatinine 9.6.  Imaging personally reviewed from CT 1/5 and MRI 1/7 with noted lesion in cranial fossa.  Chart reviewed review of notes from primary team and nephrology.  Met today with patient and his wife at the bedside to follow-up on prior conversations regarding long-term goals of care.  Wife is concerned that she does not feel he is ready to discharge due to ongoing weakness.  She has discussed with the primary team and I relayed to her that primary team would be the ones to determine medical stability for discharge.  Also reviewed ongoing comorbidities including diagnosis of new likely meningioma.  Wife reports they are not going to discuss further about this until he is able to speak with neurosurgeon as an outpatient to get more information about what is going on.  She relays that they are invested in plan to continue with any and all interventions and is really only worried about his discharge plan at this time.  Review of Systems Sleepy at time of my encounter.  Denies complaints currently but reports nausea earlier today.  Objective:   Vital Signs:  BP (!) 124/56   Pulse 77   Temp 97.9 F (36.6 C) (Oral)   Resp 18   Ht 6' 3 (1.905 m)   Wt 100 kg   SpO2 97%   BMI 27.56 kg/m   Physical Exam: General: Sleepy but alert Eyes: conjunctiva clear, anicteric sclera HENT: normocephalic, atraumatic, moist mucous membranes Cardiovascular: RRR Respiratory: no increased work of breathing noted, not in respiratory distress Skin:  no rashes or lesions on visible skin  Imaging: @IMAGES @  I personally reviewed recent imaging.   Assessment & Plan:   Assessment: 69 year old male with history of end-stage renal disease on PD, TURP with indwelling Foley, failure to thrive, osteoarthritis and new likely meningioma  Recommendations/Plan: # Complex medical decision making/goals of care:  - Full code/full scope  - Not interested in further discussion of goals of care at this time  - Recommend outpatient palliative care at discharge.  -Please call if there are specific needs with which we can be of further help in the care of [name].  -  Code Status: Full Code  Prognosis: Unable to determine  # Symptom management: Patient is receiving these palliative interventions for symptom management with an intent to improve quality of life.   Nausea: On scheduled Reglan   # Discharge Planning: Home with Home Health  -  Discussed with: Patient and wife  Thank you for allowing the palliative care team to participate in the care Shoua LITTIE Corporal.  Amaryllis Meissner, MD Palliative Care Provider PMT # [phone]  If patient remains symptomatic despite maximum doses, please call PMT at [phone] between 0700 and 1900. Outside of these hours, please call attending, as PMT does not have night coverage.   I personally spent a total of 30 minutes in the care of the patient today including preparing to see the patient, getting/reviewing separately obtained history, performing a medically appropriate exam/evaluation, and documenting clinical information in  the EHR.  "

## 2024-04-14 NOTE — Plan of Care (Signed)

## 2024-04-14 NOTE — Assessment & Plan Note (Signed)
 Hx TURP, BPH: Continue home tamsulosin  GERD: Continue home Protonix  Neuropathy: gabapentin  to 300 BID, home cymbalta   Mood: Continue home Zoloft  100 daily

## 2024-04-14 NOTE — Assessment & Plan Note (Signed)
-   Continue PO Reglan  before meals  - Consider discharging with Reglan  if tolerating

## 2024-04-14 NOTE — Progress Notes (Signed)
" ° ° ° °  Daily Progress Note Intern Pager: [phone]  Patient name: Louis Butler Medical record number: [id number] Date of birth: 05-29-1955 Age: 69 y.o. Gender: male  Primary Care Provider: Donah Laymon PARAS, MD Consultants: Nephrology, RD, palliative Code Status: Full   Pt Overview and Major Events to Date:  12/31: Admitted for orthostasis and FTT in the setting of obstructive uropathy 01/08: Medically stable  Assessment and Plan:   Louis Butler is a 69 year old male with a history of ESRD on PD, TURP with indwelling Foley, admitted for failure to thrive, orthostasis, and urinary retention.   Hospital course has been complicated by persistent nausea with PD overnight and in the mornings.  Doing well on Reglan  regimen BID before breakfast and dinner, has been able to tolerate meals without vomiting over the last few days. Suspect cause from gastroparesis vs meningioma found on CT. Patient will require neurosurgery follow-up outpatient. Assessment & Plan ESRD on peritoneal dialysis [facility]) Urinary retention with chronic foley - Nephrology following, managing electrolytes and peritoneal dialysis. Appreciate their recommendations and input - Continue nightly PD - Continue Auryxia  210 mg TID  - Continue Flomax  0.4 mg daily - Lab holiday Nausea - Continue PO Reglan  before meals  - Consider discharging with Reglan  if tolerating Headache (Resolved: 04/14/2024) Meningioma less likely as cause of persistent nausea, more likely gastroparesis. Failure to thrive in adult Dizziness Orthostatic hypotension - Continue 10 mg midodrine  TID per nephrology  - RD consulted, appreciate recommendations:  - Prosource feeding supplement BID  - Renal multivitamin daily  - Oral thiamine  daily for 7 days (1/1-7) - Continue to work with PT/OT - Fall precautions Chronic health problem Hx TURP, BPH: Continue home tamsulosin  GERD: Continue home Protonix  Neuropathy: gabapentin  to 300 BID, home cymbalta    Mood: Continue home Zoloft  100 daily  FEN/GI: Regular PPx: Heparin  Dispo:Home tomorrow.  Subjective:  Patient was seen and examined at bedside, reports no nausea this morning and was tolerating breakfast well.  Objective: Temp:  [98 F (36.7 C)-98.5 F (36.9 C)] 98.5 F (36.9 C) (01/08 0849) Pulse Rate:  [88-113] 91 (01/08 0849) Resp:  [17-20] 18 (01/08 0849) BP: (92-141)/(53-65) 92/53 (01/08 0849) SpO2:  [96 %-100 %] 100 % (01/08 0849) Weight:  [100 kg] 100 kg (01/08 0444) Physical Exam: General: Chronically ill-appearing male resting comfortably in hospital bed eating breakfast, in no acute distress, appears slightly more active today Respiratory: Normal work of breathing on room air Extremities: No peripheral edema, moves all extremities equally  Laboratory: Most recent CBC Lab Results  Component Value Date   WBC 6.9 04/11/2024   HGB 7.7 (L) 04/11/2024   HCT 23.2 (L) 04/11/2024   MCV 99.6 04/11/2024   PLT 185 04/11/2024   Most recent BMP    Latest Ref Rng & Units 04/14/2024    5:02 AM  BMP  Glucose 70 - 99 mg/dL 817   BUN 8 - 23 mg/dL 51   Creatinine 9.38 - 1.24 mg/dL 0.79   Sodium 864 - 854 mmol/L 132   Potassium 3.5 - 5.1 mmol/L 4.0   Chloride 98 - 111 mmol/L 96   CO2 22 - 32 mmol/L 25   Calcium  8.9 - 10.3 mg/dL 7.9     [name], DO 04/14/2024, 4:03 PM  PGY-1, [facility] FPTS Intern pager: [phone], text pages welcome Secure chat group [facility] Teaching Service   "

## 2024-04-14 NOTE — Progress Notes (Addendum)
 Physical Therapy Treatment Patient Details Name: Louis Butler MRN: [id number] DOB: 1956/03/11 Today's Date: 04/14/2024   History of Present Illness Pt is a 69 y.o. M who presents 04/05/2024 with weakness, dizziness malaise. Significant PMH: ESRD on peritoneal dialysis, T2DM, HTN, CA external auditory canal and neck, MDD, diabetic neuropathy.    PT Comments  Pt received in supine and agreeable to session with encouragement. Pt able to complete bed mobility and transfers with up to min A and increased time. Pt reports dizziness that increases in standing and positive orthostasis. Noted slight B knee flexion in standing despite cues. Deferred further ambulation due to low BP, but pt able to complete BLE exercise sitting EOB. Education provided on BLE exercises in bed for ROM and strengthening as well as positioning to prevent knee contractures. Pt and his wife reports they have been going to the bathroom without staff due to pt wanting privacy. Education provided on safety with BP dropping and need for staff to be present during OOB mobility. Pt continues to benefit from PT services to progress toward functional mobility goals.  Orthostatic BPs  Supine 118/56  Sitting 105/61  Standing 59/43  Supine at end of session 104/59      If plan is discharge home, recommend the following: A little help with walking and/or transfers;A little help with bathing/dressing/bathroom;Assistance with cooking/housework;Assist for transportation;Help with stairs or ramp for entrance   Can travel by private vehicle        Equipment Recommendations  Rolling walker (2 wheels)    Recommendations for Other Services       Precautions / Restrictions Precautions Precautions: Fall;Other (comment) Recall of Precautions/Restrictions: Intact Precaution/Restrictions Comments: orthostatic Restrictions Weight Bearing Restrictions Per Provider Order: No     Mobility  Bed Mobility Overal bed mobility: Needs  Assistance Bed Mobility: Supine to Sit, Sit to Supine     Supine to sit: HOB elevated, Contact guard Sit to supine: Min assist   General bed mobility comments: Min A for BLE elevation back to EOB. Increased time and effort    Transfers Overall transfer level: Needs assistance Equipment used: Rolling walker (2 wheels) Transfers: Sit to/from Stand Sit to Stand: Min assist           General transfer comment: STS from EOB with  min A for rise    Ambulation/Gait               General Gait Details: unable due to BP drop   Stairs             Wheelchair Mobility     Tilt Bed    Modified Rankin (Stroke Patients Only)       Balance Overall balance assessment: Needs assistance Sitting-balance support: No upper extremity supported, Feet supported Sitting balance-Leahy Scale: Good     Standing balance support: Reliant on assistive device for balance, Bilateral upper extremity supported Standing balance-Leahy Scale: Poor Standing balance comment: static standing with RW support                            Communication Communication Communication: Impaired Factors Affecting Communication: Reduced clarity of speech (low volume)  Cognition Arousal: Alert Behavior During Therapy: Flat affect   PT - Cognitive impairments: No apparent impairments                       PT - Cognition Comments: Slow responses and processing Following commands: Intact  Cueing Cueing Techniques: Verbal cues  Exercises General Exercises - Lower Extremity Long Arc Quad: AROM, Seated, Both, 10 reps    General Comments General comments (skin integrity, edema, etc.): symptomatic orthostatic hypotension. Orthostatic vitals listed above      Pertinent Vitals/Pain Pain Assessment Pain Assessment: No/denies pain     PT Goals (current goals can now be found in the care plan section) Acute Rehab PT Goals Patient Stated Goal: to walk without  dizziness PT Goal Formulation: With patient Time For Goal Achievement: 04/20/24 Progress towards PT goals: Progressing toward goals    Frequency    Min 2X/week       AM-PAC PT 6 Clicks Mobility   Outcome Measure  Help needed turning from your back to your side while in a flat bed without using bedrails?: None Help needed moving from lying on your back to sitting on the side of a flat bed without using bedrails?: A Little Help needed moving to and from a bed to a chair (including a wheelchair)?: A Little Help needed standing up from a chair using your arms (e.g., wheelchair or bedside chair)?: A Little Help needed to walk in hospital room?: Total Help needed climbing 3-5 steps with a railing? : Total 6 Click Score: 15    End of Session   Activity Tolerance: Patient limited by fatigue;Other (comment) (orthostasis) Patient left: in bed;with call bell/phone within reach;with family/visitor present Nurse Communication: Mobility status PT Visit Diagnosis: Difficulty in walking, not elsewhere classified (R26.2);Dizziness and giddiness (R42)     Time: 8663-8593 PT Time Calculation (min) (ACUTE ONLY): 30 min  Charges:    $Therapeutic Activity: 23-37 mins PT General Charges $$ ACUTE PT VISIT: 1 Visit                    Darryle George, PTA Acute Rehabilitation Services Secure Chat Preferred  [phone]    Darryle George 04/14/2024, 2:30 PM

## 2024-04-14 NOTE — Assessment & Plan Note (Signed)
 Meningioma less likely as cause of persistent nausea, more likely gastroparesis.

## 2024-04-15 ENCOUNTER — Inpatient Hospital Stay (HOSPITAL_COMMUNITY)

## 2024-04-15 ENCOUNTER — Encounter: Payer: Self-pay | Admitting: Family Medicine

## 2024-04-15 LAB — CBC WITH DIFFERENTIAL/PLATELET
Abs Immature Granulocytes: 0.06 K/uL (ref 0.00–0.07)
Basophils Absolute: 0 K/uL (ref 0.0–0.1)
Basophils Relative: 0 %
Eosinophils Absolute: 0.4 K/uL (ref 0.0–0.5)
Eosinophils Relative: 6 %
HCT: 24.4 % — ABNORMAL LOW (ref 39.0–52.0)
Hemoglobin: 7.9 g/dL — ABNORMAL LOW (ref 13.0–17.0)
Immature Granulocytes: 1 %
Lymphocytes Relative: 18 %
Lymphs Abs: 1.2 K/uL (ref 0.7–4.0)
MCH: 33.5 pg (ref 26.0–34.0)
MCHC: 32.4 g/dL (ref 30.0–36.0)
MCV: 103.4 fL — ABNORMAL HIGH (ref 80.0–100.0)
Monocytes Absolute: 0.5 K/uL (ref 0.1–1.0)
Monocytes Relative: 7 %
Neutro Abs: 4.7 K/uL (ref 1.7–7.7)
Neutrophils Relative %: 68 %
Platelets: 214 K/uL (ref 150–400)
RBC: 2.36 MIL/uL — ABNORMAL LOW (ref 4.22–5.81)
RDW: 16.8 % — ABNORMAL HIGH (ref 11.5–15.5)
WBC: 6.8 K/uL (ref 4.0–10.5)
nRBC: 0 % (ref 0.0–0.2)

## 2024-04-15 LAB — RENAL FUNCTION PANEL
Albumin: 2.8 g/dL — ABNORMAL LOW (ref 3.5–5.0)
Anion gap: 11 (ref 5–15)
BUN: 53 mg/dL — ABNORMAL HIGH (ref 8–23)
CO2: 25 mmol/L (ref 22–32)
Calcium: 7.9 mg/dL — ABNORMAL LOW (ref 8.9–10.3)
Chloride: 95 mmol/L — ABNORMAL LOW (ref 98–111)
Creatinine, Ser: 9.38 mg/dL — ABNORMAL HIGH (ref 0.61–1.24)
GFR, Estimated: 6 mL/min — ABNORMAL LOW
Glucose, Bld: 196 mg/dL — ABNORMAL HIGH (ref 70–99)
Phosphorus: 2.4 mg/dL — ABNORMAL LOW (ref 2.5–4.6)
Potassium: 4.2 mmol/L (ref 3.5–5.1)
Sodium: 131 mmol/L — ABNORMAL LOW (ref 135–145)

## 2024-04-15 MED ORDER — IPRATROPIUM-ALBUTEROL 0.5-2.5 (3) MG/3ML IN SOLN
3.0000 mL | Freq: Once | RESPIRATORY_TRACT | Status: AC
  Administered 2024-04-15: 3 mL via RESPIRATORY_TRACT
  Filled 2024-04-15: qty 3

## 2024-04-15 MED ORDER — DELFLEX-LC/2.5% DEXTROSE 394 MOSM/L IP SOLN: INTRAPERITONEAL | Status: DC

## 2024-04-15 NOTE — Progress Notes (Signed)
 " [facility] Progress Note   Subjective:    - seen in room  Objective Vitals:   04/14/24 1931 04/15/24 0500 04/15/24 0509 04/15/24 0735  BP: 120/63  (!) 118/58 125/71  Pulse: 88  84 92  Resp: 18  17 18   Temp: 98.2 F (36.8 C)  98.2 F (36.8 C) 98.7 F (37.1 C)  TempSrc: Oral  Oral Oral  SpO2: 98%  96% 94%  Weight:  100 kg    Height:       Physical Exam General: Well appearing, older man, NAD. Room air Heart: RRR Lungs: CTAB, no rales Abdomen: soft, non-tender Extremities: no LE edema Dialysis Access:  PD cath  [facility] Weights   04/13/24 1900 04/14/24 0444 04/15/24 0500  Weight: 100 kg 100 kg 100 kg    Intake/Output Summary (Last 24 hours) at 04/15/2024 1535 Last data filed at 04/15/2024 0752 Gross per 24 hour  Intake 80 ml  Output 600 ml  Net -520 ml    Additional Objective Labs: Basic Metabolic Panel: Recent Labs  Lab 04/13/24 0331 04/14/24 0502 04/15/24 0909  NA 130* 132* 131*  K 3.9 4.0 4.2  CL 95* 96* 95*  CO2 25 25 25   GLUCOSE 145* 182* 196*  BUN 51* 51* 53*  CREATININE 9.60* 9.20* 9.38*  CALCIUM  7.7* 7.9* 7.9*  PHOS 2.9 2.3* 2.4*   Liver Function Tests: Recent Labs  Lab 04/13/24 0331 04/14/24 0502 04/15/24 0909  ALBUMIN  2.9* 2.9* 2.8*   No results for input(s): LIPASE, AMYLASE in the last 168 hours. CBC: Recent Labs  Lab 04/10/24 0558 04/11/24 0425 04/15/24 0909  WBC 7.9 6.9 6.8  NEUTROABS  --   --  4.7  HGB 7.8* 7.7* 7.9*  HCT 23.4* 23.2* 24.4*  MCV 100.0 99.6 103.4*  PLT 200 185 214   Blood Culture    Component Value Date/Time   SDES URINE, CATHETERIZED 04/06/2024 1117   SPECREQUEST NONE 04/06/2024 1117   CULT  04/06/2024 1117    NO GROWTH Performed at [facility], [address] [place], [place] [address]    REPTSTATUS 04/07/2024 FINAL 04/06/2024 1117    Cardiac Enzymes: No results for input(s): CKTOTAL, CKMB, CKMBINDEX, TROPONINI in the last 168 hours. CBG: No results for input(s):  GLUCAP in the last 168 hours. Iron  Studies: No results for input(s): IRON , TIBC, TRANSFERRIN, FERRITIN in the last 72 hours. Lab Results  Component Value Date   INR 1.0 06/13/2023   INR 1.3 (H) 04/29/2023   INR 1.0 08/04/2019   Studies/Results: No results found.   Medications:  dialysis solution 1.5% low-MG/low-CA      (feeding supplement) PROSource Plus  30 mL Oral BID BM   DULoxetine   60 mg Oral Daily   ferric citrate   210 mg Oral TID WC   gabapentin   300 mg Oral QHS   gentamicin  cream  1 Application Topical Daily   heparin   5,000 Units Subcutaneous Q8H   metoCLOPramide   5 mg Oral BID AC   midodrine   10 mg Oral TID WC   multivitamin  1 tablet Oral QHS   polyethylene glycol  17 g Oral Daily   potassium chloride   20 mEq Oral Daily   sertraline   100 mg Oral Daily    Dialysis Orders: [facility] - [facility], Dr. Tobie is his primary nephrologist 5 exchanges, 2.5L fill volume, EDW 90kg - Mircera 225mcg sq monthly, last 12/17  Assessment/Plan: UTI with urinary obstruction/hydronephrosis: Prior indwelling foley, accidentally pulled out last week -  now has been replaced. Initially on abx - now stopped. Urine Cx 12/31 negative.  Hyponatremia: down to 131 today, will go back to all 2.5% fluids  Nausea: On antiemetics, changed binder to Auryxia  Head CT with ?meningioma ESRD:  On PD at home. Resume all 2.5% fluids. Hypokalemia: Recurrent, continue  Kcl 20mEq daily. Hypotension/volume: BP low on admission, not on antihypertensives at home. Started on midodrine  10mg  TID. CXR 12/31 was clear, no edema/ congestion. Euvolemic on exam, no edema.   Anemia: Hgb 9.5 -> 7.7. Aranesp  given 1/2 - follow.  Metabolic bone disease: CorrCa ok, follow. Phos better, have changed binder to Auryxia . Refuses renal diet.  Nutrition:  Alb low, continue supplements.  GOC: S/p palliative care visit, wants to continue full scope/full code  Myer Fret  MD  [facility] 04/15/2024, 3:35 PM         "

## 2024-04-15 NOTE — Progress Notes (Signed)
 Wife states pt has a cough now and sounds a little wheezy to her. Wife is requesting a chest x-ray. RN messaged MD on call to make aware.   Bari HERO Sakoya Win

## 2024-04-15 NOTE — Assessment & Plan Note (Signed)
 Hx TURP, BPH: Continue home tamsulosin  GERD: Continue home Protonix  Neuropathy: gabapentin  to 300 BID, home cymbalta   Mood: Continue home Zoloft  100 daily

## 2024-04-15 NOTE — Procedures (Signed)
PD tx initation note:    PD treatment initiated via aseptic technique. Consent signed and in chart. Patient is alert and oriented. No complaints of pain. No specimen collected. PD exit site clean, dry and intact. Gentamycin and new dressing applied. Bedside RN educated on PD machine and how to contact tech support when PD machine alarms.

## 2024-04-15 NOTE — Progress Notes (Signed)
 Dr. Lupie reported that the patient used inappropriate language during their interaction yesterday. I met with the patient and his wife, with Dr. Lupie present, to address the concern. Both expressed sincere apologies for the behavior. We acknowledged their frustration regarding the admission and discharge process, which the patient indicated contributed to his reaction. The patient and his wife understand that continued disrespectful behavior may result in dismissal from the [facility] practice.

## 2024-04-15 NOTE — Assessment & Plan Note (Signed)
-   Nephrology following, managing electrolytes and peritoneal dialysis. Appreciate their recommendations and input - Continue nightly PD - Continue Auryxia  210 mg TID  - Discontinue Flomax  0.4 mg daily patient has Foley and this could be contributing to his low blood pressure - Lab holiday

## 2024-04-15 NOTE — Progress Notes (Signed)
 "    Daily Progress Note Intern Pager: [phone]  Patient name: Louis Butler Medical record number: [id number] Date of birth: [birth year] Age: 69 y.o. Gender: male  Primary Care Provider: Donah Laymon PARAS, MD Consultants: Nephrology, RD, palliative Code Status: Full   Pt Overview and Major Events to Date:  12/31: Admitted for orthostasis and FTT in the setting of obstructive uropathy  Assessment and Plan:  Louis Butler is a 69 year old male with a history of ESRD on PD, TURP with indwelling Foley, admitted for failure to thrive, orthostasis, and urinary retention.   Hospital course has been complicated by persistent nausea with PD overnight and in the mornings.  Doing well on Reglan  regimen BID before breakfast and dinner, has been able to tolerate meals without vomiting over the last few days. Suspect cause from gastroparesis vs meningioma found on CT. Patient will require neurosurgery follow-up outpatient.  Recently with persistent orthostasis needing medication changes. Assessment & Plan ESRD on peritoneal dialysis [facility]) Urinary retention with chronic foley - Nephrology following, managing electrolytes and peritoneal dialysis. Appreciate their recommendations and input - Continue nightly PD - Continue Auryxia  210 mg TID  - Discontinue Flomax  0.4 mg daily patient has Foley and this could be contributing to his low blood pressure - Lab holiday Failure to thrive in adult Dizziness Orthostatic hypotension - Continue 10 mg midodrine  TID per nephrology   - Consider increase to to 15 mg 3 times daily with persistent orthostasis - RD consulted, appreciate recommendations:  - Prosource feeding supplement BID  - Renal multivitamin daily  - Oral thiamine  daily for 7 days (1/1-7) - Continue to work with PT/OT  - Reevaluation for LTC - Fall precautions Nausea - Continue PO Reglan  before meals  - Consider discharging with Reglan  if tolerating Chronic health problem Hx TURP,  BPH: Continue home tamsulosin  GERD: Continue home Protonix  Neuropathy: gabapentin  to 300 BID, home cymbalta   Mood: Continue home Zoloft  100 daily  FEN/GI: Regular diet PPx: Heparin  Dispo:SNF in 2-3 days. Barriers include persistent orthostasis.   Subjective:  Patient was seen and examined at bedside.  He states his weakness is still his biggest problem.  No complaints of nausea and was able to tolerate a full breakfast without difficulty.  Objective: Temp:  [97.9 F (36.6 C)-98.7 F (37.1 C)] 98.7 F (37.1 C) (01/09 0735) Pulse Rate:  [77-92] 92 (01/09 0735) Resp:  [17-18] 18 (01/09 0735) BP: (118-125)/(56-71) 125/71 (01/09 0735) SpO2:  [94 %-98 %] 94 % (01/09 0735) Weight:  [100 kg] 100 kg (01/09 0500) Physical Exam: General: Chronically ill-appearing male resting comfortably in hospital bedt, in no acute distress Respiratory: Normal work of breathing on room air Extremities: No peripheral edema, moves all extremities equally  Laboratory: Most recent CBC Lab Results  Component Value Date   WBC 6.8 04/15/2024   HGB 7.9 (L) 04/15/2024   HCT 24.4 (L) 04/15/2024   MCV 103.4 (H) 04/15/2024   PLT 214 04/15/2024   Most recent BMP    Latest Ref Rng & Units 04/15/2024    9:09 AM  BMP  Glucose 70 - 99 mg/dL 803   BUN 8 - 23 mg/dL 53   Creatinine 9.38 - 1.24 mg/dL 0.61   Sodium 864 - 854 mmol/L 131   Potassium 3.5 - 5.1 mmol/L 4.2   Chloride 98 - 111 mmol/L 95   CO2 22 - 32 mmol/L 25   Calcium  8.9 - 10.3 mg/dL 7.9     [name], DO 04/15/2024, 3:12 PM  PGY-1, [facility] FPTS Intern pager: [phone], text pages welcome Secure chat group [facility] Teaching Service   "

## 2024-04-15 NOTE — Progress Notes (Signed)
 PD post treatment note  PD treatment completed. Patient tolerated treatment well. PD effluent is clear. No specimen collected.  PD exit site clean, dry and intact. Patient is awake, oriented and in no acute distress.  Report given to bedside nurse.    04/15/24 0752  Peritoneal Catheter Mid lower abdomen  No placement date or time found.   Catheter Location: Mid lower abdomen  Site Assessment Clean, Dry, Intact  Drainage Description None  Catheter status Deaccessed  Dressing Gauze/Drain sponge  Dressing Status Clean, Dry, Intact  Dressing Intervention Assessed, no intervention needed  Completion  Treatment Status Complete  Initial Drain Volume 101  Average Dwell Time-Hour(s) 1  Average Dwell Time-Min(s) 30  Average Drain Time 39 (minutes)  Total Therapy Volume 87499  Total Therapy Time-Hour(s) 11  Total Therapy Time-Min(s) 49  Weight after Drain 216 lb 0.8 oz (98 kg)  Effluent Appearance Clear;Yellow  Fluid Balance - CCPD  Total UF (- value on cycler, pt gain) -40 mL  Procedure Comments  Tolerated treatment well? Yes  Education / Care Plan  Dialysis Education Provided Yes  Hand-off documentation  Hand-off Given Given to shift RN/LPN  Report given to (Full Name) Metta Moats, RN

## 2024-04-15 NOTE — Assessment & Plan Note (Signed)
-   Continue 10 mg midodrine  TID per nephrology   - Consider increase to to 15 mg 3 times daily with persistent orthostasis - RD consulted, appreciate recommendations:  - Prosource feeding supplement BID  - Renal multivitamin daily  - Oral thiamine  daily for 7 days (1/1-7) - Continue to work with PT/OT  - Reevaluation for LTC - Fall precautions

## 2024-04-15 NOTE — Plan of Care (Signed)
 FMTS Brief Progress Note  S: Received page that patient has a cough with wheezing per wife and she was requesting CXR. Went to see patient with Dr. Stoney. Per patient and wife, cough and wheezing have been present for about a week and having been progressively worsening.   O: BP (!) 119/59 (BP Location: Right Arm)   Pulse 88   Temp 98 F (36.7 C)   Resp 18   Ht 6' 3 (1.905 m)   Wt 100 kg   SpO2 96%   BMI 27.56 kg/m    General: Alert male in NAD.  Cardiovascular: RRR, no m/r/g appreciated. Pulmonary: Normal WOB. Wheezing present in all quadrants Extremities: Warm and well-perfused, without cyanosis or edema.  A/P: Cough, wheezing Afebrile, SpO2 96% on RA. Given patient is on PD and worsening sx of cough and wheezing, will proceed with plan as below to evaluate for fluid overload vs new infection. - CXR - Flutter valve - Duoneb x1  - Orders reviewed. Lab holiday.   Jerrie Gathers, DO 04/15/2024, 8:14 PM PGY-1, [facility] Night Resident  Please page [phone] with questions.

## 2024-04-15 NOTE — Assessment & Plan Note (Signed)
-   Continue PO Reglan  before meals  - Consider discharging with Reglan  if tolerating

## 2024-04-15 NOTE — Progress Notes (Signed)
 Physical Therapy Treatment Patient Details Name: Louis Butler MRN: [id number] DOB: 02-Feb-1956 Today's Date: 04/15/2024   History of Present Illness Pt is a 69 y.o. M who presents 04/05/2024 with weakness, dizziness malaise. Pt with persistent orthostatic hypotension during admission. MRI on 04/13/2024 with finding of new brain mass. Significant PMH: ESRD on peritoneal dialysis, T2DM, HTN, CA external auditory canal and neck, MDD, diabetic neuropathy.    PT Comments  Pt remains limited by symptomatic orthostatic hypotension, BP documented in general comments portion of this note. Pt is able to march in place and take a few steps away from bedside but fatigues quickly and has poor tolerance for standing. PT updates recommendations to [facility] in an effort to help manage orthostatic hypotension while also receiving PT services to improve endurance, strength and mobility quality.     If plan is discharge home, recommend the following: A little help with walking and/or transfers;A lot of help with bathing/dressing/bathroom;Assistance with cooking/housework;Assist for transportation;Help with stairs or ramp for entrance   Can travel by private vehicle        Equipment Recommendations  None recommended by PT (owns necessary DME)    Recommendations for Other Services       Precautions / Restrictions Precautions Precautions: Fall;Other (comment) Recall of Precautions/Restrictions: Intact Precaution/Restrictions Comments: orthostatic Restrictions Weight Bearing Restrictions Per Provider Order: No     Mobility  Bed Mobility Overal bed mobility: Needs Assistance Bed Mobility: Supine to Sit, Sit to Supine     Supine to sit: Supervision Sit to supine: Contact guard assist        Transfers Overall transfer level: Needs assistance Equipment used: Rollator (4 wheels) Transfers: Sit to/from Stand Sit to Stand: Min assist           General transfer comment: verbal cues to increase  anterior weight shift. Pt performs 3 sit to stand transfers during session    Ambulation/Gait Ambulation/Gait assistance: Contact guard assist Gait Distance (Feet): 2 Feet (2' forward and backward at bedside) Assistive device: Rollator (4 wheels) Gait Pattern/deviations: Step-to pattern Gait velocity: reduced Gait velocity interpretation: <1.31 ft/sec, indicative of household ambulator Pre-gait activities: pt also has a trial of 8 steps marching in place at bedside General Gait Details: slowed step-to gait   Stairs             Wheelchair Mobility     Tilt Bed    Modified Rankin (Stroke Patients Only)       Balance Overall balance assessment: Needs assistance Sitting-balance support: No upper extremity supported, Feet supported Sitting balance-Leahy Scale: Good     Standing balance support: Bilateral upper extremity supported, Reliant on assistive device for balance Standing balance-Leahy Scale: Poor                              Communication Communication Communication: Impaired Factors Affecting Communication: Reduced clarity of speech  Cognition Arousal: Alert Behavior During Therapy: Flat affect   PT - Cognitive impairments: No apparent impairments                         Following commands: Intact      Cueing Cueing Techniques: Verbal cues  Exercises      General Comments General comments (skin integrity, edema, etc.): pt continues to demonstrate hypotension in standing, with BP of 102/58 in sitting initially and then 63/44 in standing. BP then 81/53 in sitting and again drops to  71/45 during following standing trial.      Pertinent Vitals/Pain Pain Assessment Pain Assessment: No/denies pain    Home Living                          Prior Function            PT Goals (current goals can now be found in the care plan section) Acute Rehab PT Goals Patient Stated Goal: to walk without dizziness Progress towards  PT goals: Not progressing toward goals - comment (remains limited by orthostatic hypotension)    Frequency    Min 2X/week      PT Plan      Co-evaluation              AM-PAC PT 6 Clicks Mobility   Outcome Measure  Help needed turning from your back to your side while in a flat bed without using bedrails?: A Little Help needed moving from lying on your back to sitting on the side of a flat bed without using bedrails?: A Little Help needed moving to and from a bed to a chair (including a wheelchair)?: A Little Help needed standing up from a chair using your arms (e.g., wheelchair or bedside chair)?: A Little Help needed to walk in hospital room?: Total Help needed climbing 3-5 steps with a railing? : Total 6 Click Score: 14    End of Session   Activity Tolerance: Treatment limited secondary to medical complications (Comment) (orthostatic hypotension) Patient left: in bed;with call bell/phone within reach Nurse Communication: Mobility status PT Visit Diagnosis: Difficulty in walking, not elsewhere classified (R26.2);Dizziness and giddiness (R42)     Time: 8593-8567 PT Time Calculation (min) (ACUTE ONLY): 26 min  Charges:    $Therapeutic Activity: 23-37 mins PT General Charges $$ ACUTE PT VISIT: 1 Visit                     [name], PT, DPT Acute Rehabilitation Office [phone]    [name] 04/15/2024, 2:43 PM

## 2024-04-15 NOTE — TOC Progression Note (Addendum)
 Transition of Care [facility]) - Progression Note    Patient Details  Name: Louis Butler MRN: [id number] Date of Birth: 07/14/1955  Transition of Care [facility]) CM/SW Contact  Tom-Johnson, Asna Muldrow Daphne, RN Phone Number: 04/15/2024, 2:55 PM  Clinical Narrative:     Patient's discharge disposition changed to LTAC/SNF. CM notified MD and PT that patient might not meet LTACH criteria d/t patient not having 3 ICU night stays, not on IV abx or drips. Patient is also on Peritoneal Dialysis. SNF's and LTACH's do not accept patients on Peritoneal Dialysis. [facility] through the hub [facility] declined d/t patient not having the revenue codes for [facility] and they also do not do PD, only HD.  Patient continues to decline home health disciplines at this time.   CM will continue to follow as patient progresses with care towards discharge.                     Expected Discharge Plan and Services                                               Social Drivers of Health (SDOH) Interventions SDOH Screenings   Food Insecurity: No Food Insecurity (04/06/2024)  Housing: Low Risk (04/06/2024)  Transportation Needs: No Transportation Needs (04/06/2024)  Utilities: Not At Risk (04/06/2024)  Alcohol Screen: Low Risk (12/01/2022)  Depression (PHQ2-9): Medium Risk (12/29/2023)  Financial Resource Strain: Low Risk (11/20/2023)   Received from [facility]  Physical Activity: Insufficiently Active (12/01/2022)  Social Connections: Unknown (04/06/2024)  Stress: No Stress Concern Present (07/10/2023)   Received from [facility]  Tobacco Use: Medium Risk (04/05/2024)  Health Literacy: Adequate Health Literacy (12/01/2022)    Readmission Risk Interventions    04/08/2024    1:16 PM 05/04/2023    4:35 PM  Readmission Risk Prevention Plan  Transportation Screening Complete Complete  PCP or Specialist Appt within 3-5 Days Complete   HRI or Home Care Consult Complete Complete   Social Work Consult for Recovery Care Planning/Counseling Complete Complete  Palliative Care Screening Not Applicable Not Applicable  Medication Review Oceanographer) Referral to Pharmacy Referral to Pharmacy

## 2024-04-16 DIAGNOSIS — N186 End stage renal disease: Secondary | ICD-10-CM | POA: Diagnosis not present

## 2024-04-16 DIAGNOSIS — Z992 Dependence on renal dialysis: Secondary | ICD-10-CM | POA: Diagnosis not present

## 2024-04-16 DIAGNOSIS — I951 Orthostatic hypotension: Secondary | ICD-10-CM | POA: Diagnosis not present

## 2024-04-16 DIAGNOSIS — K3184 Gastroparesis: Secondary | ICD-10-CM | POA: Insufficient documentation

## 2024-04-16 DIAGNOSIS — R627 Adult failure to thrive: Secondary | ICD-10-CM | POA: Diagnosis not present

## 2024-04-16 MED ORDER — DELFLEX-LC/2.5% DEXTROSE 394 MOSM/L IP SOLN: INTRAPERITONEAL | Status: DC

## 2024-04-16 MED ORDER — DELFLEX-LC/4.25% DEXTROSE 483 MOSM/L IP SOLN: INTRAPERITONEAL | Status: DC

## 2024-04-16 MED ORDER — GENTAMICIN SULFATE 0.1 % EX CREA
1.0000 | TOPICAL_CREAM | Freq: Every day | CUTANEOUS | Status: DC
  Administered 2024-04-16 – 2024-04-18 (×3): 1 via TOPICAL
  Filled 2024-04-16: qty 15

## 2024-04-16 MED ORDER — IPRATROPIUM-ALBUTEROL 0.5-2.5 (3) MG/3ML IN SOLN
3.0000 mL | RESPIRATORY_TRACT | Status: DC | PRN
  Administered 2024-04-16: 3 mL via RESPIRATORY_TRACT
  Filled 2024-04-16: qty 3

## 2024-04-16 NOTE — Assessment & Plan Note (Signed)
-   MAP remains >65 -Continue midodrine  10 mg 3 times daily, consider titration to 15 mg 3 times daily if orthostasis worsens -Prosource feeding supplement twice daily, renal multivitamin, thiamine  (1/1 - 1/11) - PT/OT

## 2024-04-16 NOTE — Assessment & Plan Note (Addendum)
-   Nephrology following, managing electrolytes and peritoneal dialysis. Appreciate their recommendations and input - Have been informed of new CXR findings - Continue nightly PD - Continue Auryxia  210 mg TID  - AM RFP

## 2024-04-16 NOTE — Assessment & Plan Note (Addendum)
-   Continue 10 mg midodrine  TID per nephrology   - Consider increase to 15 mg 3 times daily with persistent orthostasis  - Stable overnight with discontinuation of Flomax  - RD consulted, appreciate recommendations:  - Prosource feeding supplement BID  - Renal multivitamin daily  - Oral thiamine  daily for 7 days (1/1-7) - Continue to work with PT/OT - Fall precautions

## 2024-04-16 NOTE — Assessment & Plan Note (Signed)
-   Continue PO Reglan  before meals and on discharge

## 2024-04-16 NOTE — Assessment & Plan Note (Addendum)
 Hx TURP, BPH: Continue home tamsulosin  GERD: Continue home Protonix  Neuropathy: gabapentin  to 300 BID, home cymbalta   Mood: Continue home Zoloft  100 daily

## 2024-04-16 NOTE — Assessment & Plan Note (Signed)
 Hx TURP, BPH: Continue home tamsulosin  GERD: Continue home Protonix  Neuropathy: gabapentin  to 300 BID, home cymbalta   Mood: Continue home Zoloft  100 daily

## 2024-04-16 NOTE — Assessment & Plan Note (Addendum)
-   Continue PO Reglan  before meals and on discharge

## 2024-04-16 NOTE — Progress Notes (Signed)
 " [facility] Progress Note   Subjective:   Patient seen and examined at bedside.  Tolerated PD well overnight.  Ate all his breakfast.  Reports intermittent SOB, wheezing and cough with clear sputum.  CXR showed early pulmonary edema.  Crackles in RLL.  Reports dizziness with using all red bags for dialysis. UF overnight 1.35L.    Objective Vitals:   04/15/24 1924 04/15/24 2131 04/16/24 0446 04/16/24 0822  BP: (!) 119/59  126/64 116/60  Pulse: 88  84 96  Resp: 18  20 18   Temp: 98 F (36.7 C)  98.1 F (36.7 C) 98.5 F (36.9 C)  TempSrc:      SpO2: 96% 96% 97% 96%  Weight:      Height:       Physical Exam General:chronically ill appearing male in NAD Heart:RRR, no mrg Lungs:mostly CTAB, +crackles in RLL, nml WOB on RA Abdomen:soft, NTND Extremities:no LE edema Dialysis Access: PD catheter   Filed Weights   04/13/24 1900 04/14/24 0444 04/15/24 0500  Weight: 100 kg 100 kg 100 kg    Intake/Output Summary (Last 24 hours) at 04/16/2024 1232 Last data filed at 04/16/2024 0845 Gross per 24 hour  Intake 1898 ml  Output 450 ml  Net 1448 ml    Additional Objective Labs: Basic Metabolic Panel: Recent Labs  Lab 04/13/24 0331 04/14/24 0502 04/15/24 0909  NA 130* 132* 131*  K 3.9 4.0 4.2  CL 95* 96* 95*  CO2 25 25 25   GLUCOSE 145* 182* 196*  BUN 51* 51* 53*  CREATININE 9.60* 9.20* 9.38*  CALCIUM  7.7* 7.9* 7.9*  PHOS 2.9 2.3* 2.4*   Liver Function Tests: Recent Labs  Lab 04/13/24 0331 04/14/24 0502 04/15/24 0909  ALBUMIN  2.9* 2.9* 2.8*   CBC: Recent Labs  Lab 04/10/24 0558 04/11/24 0425 04/15/24 0909  WBC 7.9 6.9 6.8  NEUTROABS  --   --  4.7  HGB 7.8* 7.7* 7.9*  HCT 23.4* 23.2* 24.4*  MCV 100.0 99.6 103.4*  PLT 200 185 214   Blood Culture    Component Value Date/Time   SDES URINE, CATHETERIZED 04/06/2024 1117   SPECREQUEST NONE 04/06/2024 1117   CULT  04/06/2024 1117    NO GROWTH Performed at [facility], [address] [place], [place] [address]    REPTSTATUS 04/07/2024 FINAL 04/06/2024 1117   Medications:  dialysis solution 2.5% low-MG/low-CA      (feeding supplement) PROSource Plus  30 mL Oral BID BM   DULoxetine   60 mg Oral Daily   ferric citrate   210 mg Oral TID WC   gabapentin   300 mg Oral QHS   gentamicin  cream  1 Application Topical Daily   heparin   5,000 Units Subcutaneous Q8H   metoCLOPramide   5 mg Oral BID AC   midodrine   10 mg Oral TID WC   multivitamin  1 tablet Oral QHS   polyethylene glycol  17 g Oral Daily   potassium chloride   20 mEq Oral Daily   sertraline   100 mg Oral Daily    Dialysis Orders: CCPD - [facility], Dr. Tobie is his primary nephrologist 5 exchanges, 2.5L fill volume, EDW 90kg - Mircera 225mcg sq monthly, last 12/17   Assessment/Plan: UTI with urinary obstruction/hydronephrosis: Prior indwelling foley, accidentally pulled out last week - now has been replaced. Initially on abx - now stopped. Urine Cx 12/31 negative.  Hyponatremia: improved to 131.Continue to monitor/manage with PD. Nausea: On antiemetics, changed binder to Auryxia  Head CT with ?meningioma  ESRD:  On PD at home. Change to 4.25% and 2.5% bags tonight.  Hypokalemia: Recurrent, continue  Kcl 20mEq daily. Hypotension/volume: BP low on admission, not on antihypertensives at home. Started on midodrine  10mg  TID. No edema on exam.  Reports wheezing and intermittent SOB. Crackles in RLL, CXR with early pulmonary edema.  Will use 1/2 4.25% bags tonight to increase UF. Dont want to use all 4.25% bags d/t complaints of dizziness with use.  Anemia: Hgb 9.5 -> 7.7. Aranesp  200mcg given 1/2.  Hgb improved to 7.9.  Metabolic bone disease: CorrCa ok, follow. Phos better, have changed binder to Auryxia . Refuses renal diet.  Nutrition:  Alb low, continue supplements.  GOC: S/p palliative care visit, wants to continue full scope/full code  Manuelita Labella, PA-C [facility] 04/16/2024,12:32 PM  LOS: 10 days     "

## 2024-04-16 NOTE — TOC Progression Note (Addendum)
 Transition of Care [facility]) - Progression Note    Patient Details  Name: Louis Butler MRN: [id number] Date of Birth: 1955/11/22  Transition of Care [facility]) CM/SW Contact  Robynn Eileen Hoose, RN Phone Number: 04/16/2024, 2:28 PM  Clinical Narrative:    Secure message from provider regarding patient possibly being candidate for [facility]. Phone call attempt made to reach intake coordinator, voicemail left.  1558: Per DJ with [facility], unable to accept patient with PD at this time, Provider made aware.                     Expected Discharge Plan and Services                                               Social Drivers of Health (SDOH) Interventions SDOH Screenings   Food Insecurity: No Food Insecurity (04/06/2024)  Housing: Low Risk (04/06/2024)  Transportation Needs: No Transportation Needs (04/06/2024)  Utilities: Not At Risk (04/06/2024)  Alcohol Screen: Low Risk (12/01/2022)  Depression (PHQ2-9): Medium Risk (12/29/2023)  Financial Resource Strain: Low Risk (11/20/2023)   Received from [facility]  Physical Activity: Insufficiently Active (12/01/2022)  Social Connections: Unknown (04/06/2024)  Stress: No Stress Concern Present (07/10/2023)   Received from [facility]  Tobacco Use: Medium Risk (04/05/2024)  Health Literacy: Adequate Health Literacy (12/01/2022)    Readmission Risk Interventions    04/08/2024    1:16 PM 05/04/2023    4:35 PM  Readmission Risk Prevention Plan  Transportation Screening Complete Complete  PCP or Specialist Appt within 3-5 Days Complete   HRI or Home Care Consult Complete Complete  Social Work Consult for Recovery Care Planning/Counseling Complete Complete  Palliative Care Screening Not Applicable Not Applicable  Medication Review Oceanographer) Referral to Pharmacy Referral to Pharmacy

## 2024-04-16 NOTE — Progress Notes (Incomplete)
" ° ° ° °  Daily Progress Note Intern Pager: [phone]  Patient name: Louis Butler Medical record number: [id number] Date of birth: [birth year] Age: 69 y.o. Gender: male  Primary Care Provider: Donah Laymon PARAS, MD Consultants: Nephrology, palliative Code Status: Full  Pt Overview and Major Events to Date:  12/21: Admitted for orthostasis and FTT in setting of obstructive uropathy 1/9: Wheezing with developing mild pulmonary edema on CXR  Assessment and Plan:  69 year old male with history of ESRD on PD, TURP with indwelling Foley, admitted for failure to thrive, orthostasis and urinary retention.   Nausea vomiting improved with Reglan  before meal.  Yesterday noticed developing pulmonary edema, which improved clinically with overnight PD.  Patient is medically stable for discharge pending safe disposition, unfortunately declined by LTAC-Will have our team touch base again about home health. Assessment & Plan ESRD on peritoneal dialysis [facility]) Urinary retention with chronic foley - Nephrology following, managing electrolytes and peritoneal dialysis. Appreciate their recommendations and input - Continue nightly PD - Continue Auryxia  210 mg TID  - AM RFP  Failure to thrive in adult Dizziness Orthostatic hypotension - MAP remains >65 -Continue midodrine  10 mg 3 times daily, consider titration to 15 mg 3 times daily if orthostasis worsens -Prosource feeding supplement twice daily, renal multivitamin, thiamine  (1/1 - 1/11) - PT/OT Nausea Gastroparesis - Continue PO Reglan  before meals and on discharge Chronic health problem Hx TURP, BPH: Continue home tamsulosin  GERD: Continue home Protonix  Neuropathy: gabapentin  to 300 BID, home cymbalta   Mood: Continue home Zoloft  100 daily  FEN/GI: Regular PPx: Heparin  Dispo: Likely home or home with home health  Subjective:  Patient sleeping, gently wakes during exam but falls back asleep.  Objective: Temp:  [98.1 F (36.7 C)-98.5 F  (36.9 C)] 98.1 F (36.7 C) (01/10 1626) Pulse Rate:  [81-96] 81 (01/10 1626) Resp:  [18-20] 18 (01/10 1626) BP: (116-131)/(59-64) 131/59 (01/10 1626) SpO2:  [96 %-98 %] 98 % (01/10 1626) Physical Exam: General: NAD, sleeping comfortably in bed Cardiovascular: RRR Respiratory: CTAB, normal work of breathing on room air Abdomen: Soft, not distended, nontender Extremities: No peripheral edema, no TTP  Laboratory: Most recent CBC Lab Results  Component Value Date   WBC 6.8 04/15/2024   HGB 7.9 (L) 04/15/2024   HCT 24.4 (L) 04/15/2024   MCV 103.4 (H) 04/15/2024   PLT 214 04/15/2024   Most recent BMP    Latest Ref Rng & Units 04/15/2024    9:09 AM  BMP  Glucose 70 - 99 mg/dL 803   BUN 8 - 23 mg/dL 53   Creatinine 9.38 - 1.24 mg/dL 0.61   Sodium 864 - 854 mmol/L 131   Potassium 3.5 - 5.1 mmol/L 4.2   Chloride 98 - 111 mmol/L 95   CO2 22 - 32 mmol/L 25   Calcium  8.9 - 10.3 mg/dL 7.9    Howell Lunger, DO 04/16/2024, 8:48 PM  PGY-3, [facility] FPTS Intern pager: [phone], text pages welcome Secure chat group [facility] Teaching Service   "

## 2024-04-16 NOTE — Assessment & Plan Note (Signed)
-   Nephrology following, managing electrolytes and peritoneal dialysis. Appreciate their recommendations and input - Have been informed of new CXR findings - Continue nightly PD - Continue Auryxia  210 mg TID  - AM RFP

## 2024-04-16 NOTE — Progress Notes (Signed)
 PD post treatment note  PD treatment completed. Patient tolerated treatment well. PD effluent is clear. No specimen collected.  PD exit site clean, dry and intact. Patient is awake, oriented and in no acute distress.  Report given to bedside nurse.    04/16/24 0714  Peritoneal Catheter Mid lower abdomen  No placement date or time found.   Catheter Location: Mid lower abdomen  Site Assessment Clean, Dry, Intact  Drainage Description None  Catheter status Deaccessed  Dressing Gauze/Drain sponge  Dressing Status Clean, Dry, Intact  Dressing Intervention Assessed, no intervention needed  Completion  Treatment Status Complete  Initial Drain Volume 135  Average Dwell Time-Hour(s) 1  Average Dwell Time-Min(s) 30  Average Drain Time 20  Total Therapy Volume 87499  Total Therapy Time-Hour(s) 10  Total Therapy Time-Min(s) 24  Weight after Drain 213 lb 13.5 oz (97 kg)  Effluent Appearance Clear;Yellow  Cell Count on Daytime Exchange N/A  Fluid Balance - CCPD  Total UF (- value on cycler, pt gain) 1358 mL  Procedure Comments  Tolerated treatment well? Yes  Hand-off documentation  Hand-off Given Given to shift RN/LPN  Report given to (Full Name) Bari Lor, RN

## 2024-04-16 NOTE — Progress Notes (Signed)
 "    Daily Progress Note Intern Pager: [phone]  Patient name: Louis Butler Medical record number: [id number] Date of birth: Nov 20, 1955 Age: 69 y.o. Gender: male  Primary Care Provider: Donah Laymon PARAS, MD Consultants: Nephrology, RD, palliative Code Status: Full   Pt Overview and Major Events to Date:  12/31: Admitted for orthostasis and FTT in the setting of obstructive uropathy 01/09: Wheezing with developing mild pulmonary edema on CXR    Assessment and Plan:   Louis Butler is a 69 year old male with a history of ESRD on PD, TURP with indwelling Foley, admitted for failure to thrive, orthostasis, and urinary retention.  While the patient's persistent nausea and vomiting are now under control with gastroparesis regimen of before meals Reglan , hospital course has been complicated by new pulmonary edema found on repeat CXR after patient wife noted wheezing overnight. Discuss with nephrology for PD volume revision and monitor closely for worsening. Will need albuterol  upon discharge as this could also be a manifestation of COPD. Patient reports years of smoking 2ppd and quit only 2 years ago.  Assessment & Plan ESRD on peritoneal dialysis [facility]) Urinary retention with chronic foley - Nephrology following, managing electrolytes and peritoneal dialysis. Appreciate their recommendations and input - Have been informed of new CXR findings - Continue nightly PD - Continue Auryxia  210 mg TID  - AM RFP  Failure to thrive in adult Dizziness Orthostatic hypotension - Continue 10 mg midodrine  TID per nephrology   - Consider increase to 15 mg 3 times daily with persistent orthostasis  - Stable overnight with discontinuation of Flomax  - RD consulted, appreciate recommendations:  - Prosource feeding supplement BID  - Renal multivitamin daily  - Oral thiamine  daily for 7 days (1/1-7) - Continue to work with PT/OT - Fall precautions Nausea Gastroparesis - Continue PO Reglan  before  meals and on discharge Chronic health problem Hx TURP, BPH: Continue home tamsulosin  GERD: Continue home Protonix  Neuropathy: gabapentin  to 300 BID, home cymbalta   Mood: Continue home Zoloft  100 daily  FEN/GI: Regular diet PPx: Heparin  Dispo:Home pending clinical improvement . Barriers include pulmonary edema and persistent weakness.   Subjective:  Patient was seen and examined at bedside. He seems to be in better spirits today, states his breathing is not as bad as the episode last night. Speaks to me comfortably. No other complaints.  Objective: Temp:  [98 F (36.7 C)-98.7 F (37.1 C)] 98.1 F (36.7 C) (01/10 0446) Pulse Rate:  [79-92] 84 (01/10 0446) Resp:  [18-20] 20 (01/10 0446) BP: (119-126)/(54-71) 126/64 (01/10 0446) SpO2:  [94 %-97 %] 97 % (01/10 0446) Physical Exam: General: chronically ill-appearing male sitting up comfortably in hospital bed in no acute distress Cardiovascular: distant heart sounds, radial pulses 2+ and regular, no m/r/g Respiratory: good respiratory effort, good air movement throughout all lung fields, mild expiratory wheezing most likely upper airway transmitted noises  Abdomen: soft, non-tender, non-distended, BS present Extremities: no peripheral edema, no TTP  Laboratory: Most recent CBC Lab Results  Component Value Date   WBC 6.8 04/15/2024   HGB 7.9 (L) 04/15/2024   HCT 24.4 (L) 04/15/2024   MCV 103.4 (H) 04/15/2024   PLT 214 04/15/2024   Most recent BMP    Latest Ref Rng & Units 04/15/2024    9:09 AM  BMP  Glucose 70 - 99 mg/dL 803   BUN 8 - 23 mg/dL 53   Creatinine 9.38 - 1.24 mg/dL 0.61   Sodium 864 - 854 mmol/L 131  Potassium 3.5 - 5.1 mmol/L 4.2   Chloride 98 - 111 mmol/L 95   CO2 22 - 32 mmol/L 25   Calcium  8.9 - 10.3 mg/dL 7.9    [name], DO 04/16/2024, 7:35 AM  PGY-1, [facility] FPTS Intern pager: [phone], text pages welcome Secure chat group [facility] Teaching Service   "

## 2024-04-17 DIAGNOSIS — N186 End stage renal disease: Secondary | ICD-10-CM | POA: Diagnosis not present

## 2024-04-17 DIAGNOSIS — Z992 Dependence on renal dialysis: Secondary | ICD-10-CM | POA: Diagnosis not present

## 2024-04-17 DIAGNOSIS — R627 Adult failure to thrive: Secondary | ICD-10-CM | POA: Diagnosis not present

## 2024-04-17 DIAGNOSIS — I951 Orthostatic hypotension: Secondary | ICD-10-CM | POA: Diagnosis not present

## 2024-04-17 LAB — RENAL FUNCTION PANEL
Albumin: 2.7 g/dL — ABNORMAL LOW (ref 3.5–5.0)
Anion gap: 12 (ref 5–15)
BUN: 51 mg/dL — ABNORMAL HIGH (ref 8–23)
CO2: 23 mmol/L (ref 22–32)
Calcium: 8 mg/dL — ABNORMAL LOW (ref 8.9–10.3)
Chloride: 95 mmol/L — ABNORMAL LOW (ref 98–111)
Creatinine, Ser: 9.54 mg/dL — ABNORMAL HIGH (ref 0.61–1.24)
GFR, Estimated: 5 mL/min — ABNORMAL LOW
Glucose, Bld: 343 mg/dL — ABNORMAL HIGH (ref 70–99)
Phosphorus: 2.3 mg/dL — ABNORMAL LOW (ref 2.5–4.6)
Potassium: 4.3 mmol/L (ref 3.5–5.1)
Sodium: 130 mmol/L — ABNORMAL LOW (ref 135–145)

## 2024-04-17 NOTE — Plan of Care (Signed)

## 2024-04-17 NOTE — Progress Notes (Signed)
 FMTS ATTENDING ADMISSION NOTE Louis Butt,MD I  have seen and examined this patient, reviewed their chart. I have discussed this patient with the resident.  Will cosign resident's note once completed.  No new concerns today. I discussed with him and his wife that his LTACH request was not approved and will likely go home on Monday with HHPT. They both verbalized understanding. Otherwise, he was eating breakfast comfortably this morning.  Exam: Gen: No distress HEENT: EOMI, PERRLA Neuro: Grossly intact Lungs: Air entry equal B/L Heart: No murmurs. RRR Abd: Soft, NT/ND, BS+ and normoactive Ext: No edema  A/P: ESRD on peritoneal dialysis  Stable  Continue PD with the Nephrology team Monitor electrolytes closely - daily renal panel.  Hypotension: Chronic Component of orthostasis per PT orthostatic vital report Tamsulosin  d/ced.  Midodrine  increased during this admission, and may need to increase dose further. 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

## 2024-04-17 NOTE — Progress Notes (Signed)
" °   04/17/24 1922  Peritoneal Catheter Mid lower abdomen  No placement date or time found.   Catheter Location: Mid lower abdomen  Site Assessment Clean, Dry, Intact  Drainage Description None  Catheter status Accessed  Dressing Gauze/Drain sponge  Dressing Status Clean, Dry, Intact  Dressing Intervention New dressing/dressing changed  Cycler Setup  Total Number of Night Cycles 5  Night Fill Volume 2500  Dianeal  Solution Dextrose  4.25% in 2000 mL Low Cal/Low Mag (4.25 %  &  2.5% Dianeal   low calcium )  Dianeal  Additive Other (Comment) (no additives)  Night Dwell Time per Cycle - Hour(s) 1  Night Dwell Time per Cycle - Minute(s) 30  Night Time Therapy - Minute(s) 37  Night Time Therapy - Hour(s) 10  Minimum Initial Drain Volume 0  Maximum Peritoneal Volume 3750  Night/Total Therapy Volume 87499  Day Exchange No  Hand-off documentation  Hand-off Given Given to shift RN/LPN  Hand-off Received Received from shift RN/LPN  Report received from (Full Name) Louis Butler   Treatment started ,no complications, pD catheter dressing changed. "

## 2024-04-17 NOTE — Progress Notes (Signed)
 " [facility] Progress Note   Subjective:   Patient seen and examined at bedside while eating breakfast.  Reports he required a breathing treatment last night due to worsened shortness of breath.  Feeling better this AM.  On RA. Denies SOB, CP, abdominal pain and n/v/d.  Tolerated PD well overnight. Net UF 1.7L overnight.  Objective Vitals:   04/16/24 2118 04/17/24 0349 04/17/24 0500 04/17/24 0731  BP: (!) 115/48 102/64  (!) 114/59  Pulse: 86 92  83  Resp: 18 18  18   Temp: 98.2 F (36.8 C) 98.2 F (36.8 C)  98.2 F (36.8 C)  TempSrc:      SpO2: 96% 97%  99%  Weight:   96 kg   Height:       Physical Exam General:well appearing male in NAD Heart:RRR, no mrg Lungs:CTAB, nml WOB on RA Abdomen:soft, NTND Extremities:no LE edema Dialysis Access: PD catheter   Filed Weights   04/14/24 0444 04/15/24 0500 04/17/24 0500  Weight: 100 kg 100 kg 96 kg    Intake/Output Summary (Last 24 hours) at 04/17/2024 1321 Last data filed at 04/17/2024 0857 Gross per 24 hour  Intake --  Output 2732 ml  Net -2732 ml    Additional Objective Labs: Basic Metabolic Panel: Recent Labs  Lab 04/14/24 0502 04/15/24 0909 04/17/24 0321  NA 132* 131* 130*  K 4.0 4.2 4.3  CL 96* 95* 95*  CO2 25 25 23   GLUCOSE 182* 196* 343*  BUN 51* 53* 51*  CREATININE 9.20* 9.38* 9.54*  CALCIUM  7.9* 7.9* 8.0*  PHOS 2.3* 2.4* 2.3*   Liver Function Tests: Recent Labs  Lab 04/14/24 0502 04/15/24 0909 04/17/24 0321  ALBUMIN  2.9* 2.8* 2.7*   CBC: Recent Labs  Lab 04/11/24 0425 04/15/24 0909  WBC 6.9 6.8  NEUTROABS  --  4.7  HGB 7.7* 7.9*  HCT 23.2* 24.4*  MCV 99.6 103.4*  PLT 185 214   Studies/Results: DG Chest Port 1 View Result Date: 04/15/2024 EXAM: 1 VIEW(S) XRAY OF THE CHEST 04/15/2024 10:02:00 PM COMPARISON: Chest x-ray 04/06/2024, CT chest 04/06/2024. CLINICAL HISTORY: Wheezing. FINDINGS: LUNGS AND PLEURA: Developing mild pulmonary edema. No pleural effusion. No pneumothorax.  HEART AND MEDIASTINUM: No acute abnormality of the cardiac and mediastinal silhouettes. BONES AND SOFT TISSUES: No acute osseous abnormality. IMPRESSION: 1. Developing mild pulmonary edema. Electronically signed by: Morgane Naveau MD MD 04/15/2024 10:46 PM EST RP Workstation: [id number]    Medications:  dialysis solution 2.5% low-MG/low-CA     dialysis solution 4.25% low-MG/low-CA      (feeding supplement) PROSource Plus  30 mL Oral BID BM   DULoxetine   60 mg Oral Daily   ferric citrate   210 mg Oral TID WC   gabapentin   300 mg Oral QHS   gentamicin  cream  1 Application Topical Daily   heparin   5,000 Units Subcutaneous Q8H   metoCLOPramide   5 mg Oral BID AC   midodrine   10 mg Oral TID WC   multivitamin  1 tablet Oral QHS   polyethylene glycol  17 g Oral Daily   potassium chloride   20 mEq Oral Daily   sertraline   100 mg Oral Daily    Dialysis Orders: CCPD - [facility], Dr. Tobie is his primary nephrologist 5 exchanges, 2.5L fill volume, EDW 90kg - Mircera 225mcg sq monthly, last 12/17   Assessment/Plan: UTI with urinary obstruction/hydronephrosis: Prior indwelling foley, accidentally pulled out last week - now has been replaced. Initially on abx - now stopped. Urine Cx 12/31  negative.  Hyponatremia: improved to 130.Continue to monitor/manage with PD. Nausea: On antiemetics, changed binder to Auryxia  Head CT with ?meningioma ESRD:  On PD at home. Continue 1/2 4.25% and 1/2 2.5% bags tonight.  Hypokalemia: Recurrent, continue  Kcl 20mEq daily. Hypotension/volume: BP low on admission, not on antihypertensives at home. Started on midodrine  10mg  TID. No edema on exam. SOB improved. Net UF 1.7L last night.  Will use 1/2 4.25% bags again tonight to increase UF prior to d/c tomorrow. Dont want to use all 4.25% bags d/t complaints of dizziness with use.  Anemia: Hgb 9.5 -> 7.7. Aranesp  200mcg given 1/2.  Hgb improved to 7.9.  Metabolic bone disease: CorrCa ok, follow. Phos better, have changed  binder to Auryxia . Refuses renal diet.  Nutrition:  Alb low, continue supplements.  GOC: S/p palliative care visit, wants to continue full scope/full code  Manuelita Labella, PA-C [facility] 04/17/2024,1:21 PM  LOS: 11 days    "

## 2024-04-17 NOTE — Progress Notes (Deleted)
 Error

## 2024-04-18 ENCOUNTER — Other Ambulatory Visit (HOSPITAL_COMMUNITY): Payer: Self-pay

## 2024-04-18 ENCOUNTER — Telehealth (HOSPITAL_COMMUNITY): Payer: Self-pay

## 2024-04-18 DIAGNOSIS — N186 End stage renal disease: Secondary | ICD-10-CM | POA: Diagnosis not present

## 2024-04-18 DIAGNOSIS — Z992 Dependence on renal dialysis: Secondary | ICD-10-CM | POA: Diagnosis not present

## 2024-04-18 MED ORDER — MIDODRINE HCL 10 MG PO TABS
10.0000 mg | ORAL_TABLET | Freq: Three times a day (TID) | ORAL | 0 refills | Status: AC
  Filled 2024-04-18: qty 90, 30d supply, fill #0

## 2024-04-18 MED ORDER — PROSOURCE PLUS PO LIQD
30.0000 mL | Freq: Two times a day (BID) | ORAL | 0 refills | Status: AC
  Filled 2024-04-18: qty 887, 15d supply, fill #0

## 2024-04-18 MED ORDER — DULOXETINE HCL 60 MG PO CPEP
60.0000 mg | ORAL_CAPSULE | Freq: Every day | ORAL | 0 refills | Status: AC
  Filled 2024-04-18: qty 30, 30d supply, fill #0

## 2024-04-18 MED ORDER — METOCLOPRAMIDE HCL 5 MG PO TABS
5.0000 mg | ORAL_TABLET | Freq: Two times a day (BID) | ORAL | 0 refills | Status: AC
  Filled 2024-04-18: qty 60, 30d supply, fill #0

## 2024-04-18 MED ORDER — FERRIC CITRATE 1 GM 210 MG(FE) PO TABS
210.0000 mg | ORAL_TABLET | Freq: Three times a day (TID) | ORAL | 0 refills | Status: DC
  Filled 2024-04-18: qty 270, 90d supply, fill #0

## 2024-04-18 MED ORDER — ONDANSETRON 4 MG PO TBDP
4.0000 mg | ORAL_TABLET | Freq: Four times a day (QID) | ORAL | 0 refills | Status: AC | PRN
  Filled 2024-04-18: qty 20, 5d supply, fill #0

## 2024-04-18 MED ORDER — CALCIUM ACETATE (PHOS BINDER) 667 MG PO CAPS
667.0000 mg | ORAL_CAPSULE | Freq: Three times a day (TID) | ORAL | 0 refills | Status: AC
  Filled 2024-04-18: qty 90, 30d supply, fill #0

## 2024-04-18 MED ORDER — DELFLEX-LC/2.5% DEXTROSE 394 MOSM/L IP SOLN: INTRAPERITONEAL | Status: DC

## 2024-04-18 MED ORDER — POTASSIUM CHLORIDE CRYS ER 20 MEQ PO TBCR
20.0000 meq | EXTENDED_RELEASE_TABLET | Freq: Every day | ORAL | 0 refills | Status: AC
  Filled 2024-04-18: qty 15, 15d supply, fill #0

## 2024-04-18 NOTE — Progress Notes (Addendum)
 D/C order noted. Contacted [facility] home therapy RN to be advised of pt's d/c today and return to home. Will fax d/c summary to clinic for continuation of care once d/c summary is available/completed.   Randine Mungo Dialysis Navigator [phone]  Addendum at 2:51 pm: D/C summary and today's renal note faxed to clinic for continuation of care.

## 2024-04-18 NOTE — Discharge Summary (Addendum)
 "  Family Medicine Teaching [facility] Discharge Summary  Patient name: Louis Butler Medical record number: [id number] Date of birth: 1955-12-03 Age: 69 y.o. Gender: male Date of Admission: 04/05/2024  Date of Discharge: 04/18/2024 Admitting Physician: Leafy Scriver, DO  Primary Care Provider: Donah Laymon PARAS, MD Consultants: Nephrology, palliative  Indication for Hospitalization: Failure to thrive, orthostasis, urinary retention  Discharge Diagnoses/Problem List:  Principal Problem for Admission: Other Problems addressed during stay:  Principal Problem:   ESRD on peritoneal dialysis [facility]) Active Problems:   Urinary retention with chronic foley   Moderate malnutrition   Urinary tract infection  Bilateral Hydronephrosis   Orthostatic hypotension   Chronic health problem   Failure to thrive in adult   Dizziness   Nausea   Gastroparesis   Brief Hospital Course:  Louis Butler is a 69 y.o.male with a history of ESRD, T2DM who was admitted to the family medicine teaching Service at [facility] for orthostasis and failure to thrive. His hospital course is detailed below:  Orthostasis - FTT Patient admitted for progressive orthostasis, poor PO and anorexia, acutely worsening x 1 week.  Patient has a prior history of the same.  Patient denied any falls at home and there was a low concern for cardiac or neurological etiology of his dizziness. PT, OT, RD and palliative were consulted. PO intake during admission improved, ok to continue regular diet from a nephrology standpoint. Patient declined oral nutritional supplement . Change in functional status improved by discharge. PT recommended [facility] but patient was denied as he is on peritoneal dialysis. Patient lives with his daughter-in-law who works in physical therapy and he would prefer for her to do all home therapy so he declined home health at discharge. Patient was restarted on low dose midodrine  5mg  TID while admitted to help  with symptoms.   L Hydronephrosis/ Obstructive uropathy I ESRD Patient on daily peritoneal dialysis after transition from HD this fall.  He has a chronic indwelling Foley that had accidentally fallen out 1 week ago.  Nephrology was consulted and they arranged for nightly PD. Foley was replaced. Patient was initially started on ceftriaxone , but urine culture showed no growth so was discontinued.  Urine output after placement of Foley was 1 L on day one with appropriate daily output thereafter.   Irregular heart rhythm  1/2 patient had an EKG with irregular rhythm. Has history of irregular rhythm in the past but no known Afib and is not on anticoagulation. This resolved spontaneously. Pt was monitored on continuous telemetry.  Nausea  Patient started having nausea and intermittent vomiting starting 1/1. He was managed with antiemetics. Thought to be related to his PD. He had evidence of gallstones on initial CT abdomen pelvis on admission. RUQ showed cholelithiasis but no cholecystitis, right hydronephrosis similar to previous CT scan. He did not have RUQ pain. He had regular bowel movements. Binder was switched from sevalmer to auryxia  to help with nausea. Discharged with phoslo  due to cost issues. Reglan  was scheduled twice a day starting 1/5 with relief of nausea. Patient was stable and able to tolerate PD without n/v on discharge. Given persistent nausea and vomiting with headache, obtained head CT showing concern for meningioma. MRI brain was obtained which showed right middle cranial fossa mass is approximately 19 mm and does appears to be extra-axial on this non-contrast exam. No cerebral edema. Favor Meningioma. Mild chronic small vessel disease and left mastoidectomy. Recommended follow up MRI.   Other chronic conditions were medically managed  with home medications and formulary alternatives as necessary (constipation, GERD, MDD)  PCP Follow-up Recommendations: Outpatient Palliative Care  follow up.  Recommend outpatient Neurosurgery referral for meningioma.  Recommend follow up MRI Brain W WO contrast for staging and treatment planning.  Ensure follow up with Nephrology     Results/Tests Pending at Time of Discharge:  Unresulted Labs (From admission, onward)    None        Disposition: Home  Discharge Condition: Stable  Discharge Exam:  Vitals:   04/18/24 0800 04/18/24 0926  BP: 110/70 (!) 117/55  Pulse: 85 82  Resp: 16 19  Temp: 98 F (36.7 C) 98.2 F (36.8 C)  SpO2: 99% 100%   General: Alert, well-appearing male in NAD Cardiovascular: RRR, no m/r/g appreciated. Pulmonary: Normal WOB. CTAB with no w/c/r present Abdomen: Soft, non-tender, non-distended. Extremities: Warm and well-perfused, without cyanosis or edema.  Significant Procedures: nightly peritoneal dialysis  Significant Labs and Imaging:  No results for input(s): WBC, HGB, HCT, PLT in the last 48 hours. Recent Labs  Lab 04/17/24 0321  NA 130*  K 4.3  CL 95*  CO2 23  GLUCOSE 343*  BUN 51*  CREATININE 9.54*  CALCIUM  8.0*  PHOS 2.3*  ALBUMIN  2.7*    Pertinent Imaging  DG Chest Port 1 View Result Date: 04/15/2024 1. Developing mild pulmonary edema.  MR BRAIN WO CONTRAST Result Date: 04/13/2024  1. Right middle cranial fossa mass is approximately 19 mm and does appears to be extra-axial on this non-contrast exam. No cerebral edema. Favor Meningioma. Follow-up MRI both without and with contrast would be ideal for staging and treatment planning.  2. Mild chronic small vessel disease.  3. Left mastoidectomy.   CT HEAD WO CONTRAST ( ) Result Date: 04/11/2024  1. Approximately 2.4 cm extra-axial dural-based mass along the anterior/inferior right temporal convexity, most likely a meningioma but incompletely characterized. Recommended MRI head with contrast.  2. No acute intracranial abnormality.   US  Abdomen Limited RUQ (LIVER/GB) Result Date: 04/10/2024  1. Mild  cholelithiasis without evidence of cholecystitis.  2. Right hydronephrosis is noted as described on prior CT scan.   DG Chest Port 1 View Result Date: 04/06/2024  1. Interval removal of a right IJ central line.  2. No acute cardiopulmonary abnormality.   CT ABDOMEN PELVIS W CONTRAST Result Date: 04/06/2024 1. Moderate-to-severe bilateral hydroureteronephrosis with soft tissue stranding about the ureters, more pronounced on the left.  2. Marked bilateral renal cortical atrophy with likely benign proteinaceous cysts in the right kidney; no follow-up imaging recommended.  3. Cholelithiasis without evidence of cholecystitis.    Discharge Medications:  Allergies as of 04/18/2024       Reactions   Chlorhexidine  Gluconate Itching   Hydrocodone  Hives, Itching   Percocet [oxycodone -acetaminophen ] Hives, Itching        Medication List     PAUSE taking these medications    Veltassa  8.4 g packet Wait to take this until your doctor or other care provider tells you to start again. Generic drug: patiromer  Take 8.4 g by mouth daily.       STOP taking these medications    acetaminophen  500 MG tablet Commonly known as: TYLENOL    sevelamer  carbonate 800 MG tablet Commonly known as: RENVELA    sodium bicarbonate  650 MG tablet   tamsulosin  0.4 MG Caps capsule Commonly known as: FLOMAX    trimethoprim  100 MG tablet Commonly known as: TRIMPEX        TAKE these medications    (feeding supplement)  PROSource Plus liquid Take 30 mLs by mouth 2 (two) times daily between meals.   blood glucose meter kit and supplies Kit Dispense based on patient and insurance preference. Use up to four times daily as directed. (FOR ICD-9 250.00, 250.01).   calcium  acetate 667 MG capsule Commonly known as: PHOSLO  Take 1 capsule (667 mg total) by mouth 3 (three) times daily with meals.   DULoxetine  60 MG capsule Commonly known as: CYMBALTA  Take 1 capsule (60 mg total) by mouth daily. Start  taking on: April 19, 2024   esomeprazole  40 MG capsule Commonly known as: NEXIUM  Take 1 capsule (40 mg total) by mouth daily as needed (for heartburn).   famotidine  20 MG tablet Commonly known as: PEPCID  Take 1 tablet (20 mg total) by mouth daily.   gabapentin  300 MG capsule Commonly known as: NEURONTIN  Take 1 capsule (300 mg total) by mouth at bedtime.   hydrOXYzine  25 MG tablet Commonly known as: ATARAX  Take 25 mg by mouth daily as needed for itching.   Insulin  Syringes (Disposable) U-100 0.5 ML Misc Use as per instructions 3 times daily AC.   iron  sucrose Commonly known as: VENOFER  Inject 110 mLs (200 mg total) into the vein every Monday, Wednesday, and Friday with hemodialysis.   metoCLOPramide  5 MG tablet Commonly known as: REGLAN  Take 1 tablet (5 mg total) by mouth 2 (two) times daily before a meal.   midodrine  10 MG tablet Commonly known as: PROAMATINE  Take 1 tablet (10 mg total) by mouth 3 (three) times daily with meals. What changed:  medication strength how much to take when to take this additional instructions   multivitamin Tabs tablet Take 1 tablet by mouth at bedtime.   ondansetron  4 MG disintegrating tablet Commonly known as: ZOFRAN -ODT Take 1 tablet (4 mg total) by mouth every 6 (six) hours as needed for nausea or vomiting.   polyethylene glycol 17 g packet Commonly known as: MIRALAX  / GLYCOLAX  Take 17 g by mouth daily as needed for moderate constipation.   potassium chloride  SA 20 MEQ tablet Commonly known as: KLOR-CON  M Take 1 tablet (20 mEq total) by mouth daily. Start taking on: April 19, 2024   sertraline  100 MG tablet Commonly known as: ZOLOFT  Take 1 tablet by mouth every day   Vitamin D3 125 MCG (5000 UT) Caps Take 5,000 Units by mouth See admin instructions. Take 5,000 units 2 times weekly.               Durable Medical Equipment  (From admission, onward)           Start     Ordered   04/18/24 1217  For home use  only DME Air overlay mattress  Once       Comments: A gel overlay mattress is required due to limited mobility, impaired nutrition status.   04/18/24 1220            Discharge Instructions: Please refer to Patient Instructions section of EMR for full details.  Patient was counseled important signs and symptoms that should prompt return to medical care, changes in medications, dietary instructions, activity restrictions, and follow up appointments.   Follow-Up Appointments:   Bhagat, Virali, DO 04/18/2024, 12:40 PM PGY-1, [facility]   FPTS Upper-Level Resident Addendum   I have discussed the above with Dr. Jerrie and agree with the documented plan. My edits for correction/addition/clarification are included above. Please see any attending notes.   Payton Coward, MD PGY-3, [facility] 04/18/2024 2:15 PM  FPTS Service pager: [phone] (text pages welcome through [facility]) "

## 2024-04-18 NOTE — Progress Notes (Signed)
 Physical Therapy Treatment Patient Details Name: Louis Butler MRN: [id number] DOB: 09/07/1955 Today's Date: 04/18/2024   History of Present Illness Pt is a 69 y.o. M who presents 04/05/2024 with weakness, dizziness malaise. Pt with persistent orthostatic hypotension during admission. MRI on 04/13/2024 with finding of new brain mass. Significant PMH: ESRD on peritoneal dialysis, T2DM, HTN, CA external auditory canal and neck, MDD, diabetic neuropathy.    PT Comments  Pt admitted with above diagnosis. Wife reports pt is likely going home today.  Discussed equipment and pt will need abdominal binder (ordered 1/9 but not delivered - PT asked for it to be delivered today).  Also pt will need air mattress overlay for his hospital bed and updated CM and MD regarding this.  Created HEP for pt and educated pt and wife to each exercise and gave pt handout. Pt and wife stated that he will have plenty of help at home as daughter and daughter in law are CMAs.  They decline HH services - PT and aide.  Plan is for pt to go home today. All questions answered.  Pt currently with functional limitations due to the deficits listed below (see PT Problem List). Pt will benefit from acute skilled PT to increase their independence and safety with mobility to allow discharge.       If plan is discharge home, recommend the following: A lot of help with bathing/dressing/bathroom;Assistance with cooking/housework;Assist for transportation;Help with stairs or ramp for entrance;A lot of help with walking and/or transfers   Can travel by private vehicle        Equipment Recommendations  Other (comment) (air mattress overlay, abdominal binder (asked nurse to ensure it gets delivered to pt))    Recommendations for Other Services       Precautions / Restrictions Precautions Precautions: Fall;Other (comment) Recall of Precautions/Restrictions: Intact Precaution/Restrictions Comments: orthostatic Restrictions Weight Bearing  Restrictions Per Provider Order: No     Mobility  Bed Mobility Overal bed mobility: Needs Assistance Bed Mobility: Supine to Sit, Sit to Supine     Supine to sit: Supervision Sit to supine: Contact guard assist        Transfers Overall transfer level: Needs assistance Equipment used: Rolling walker (2 wheels) Transfers: Sit to/from Stand Sit to Stand: Mod assist, From elevated surface           General transfer comment: verbal cues to increase anterior weight shift. Pt performs 3 marches in place and then fatigues and needed to sit down. Pt too fatigued to stand another time.    Ambulation/Gait                   Stairs             Wheelchair Mobility     Tilt Bed    Modified Rankin (Stroke Patients Only)       Balance Overall balance assessment: Needs assistance Sitting-balance support: No upper extremity supported, Feet supported Sitting balance-Leahy Scale: Good     Standing balance support: Bilateral upper extremity supported, Reliant on assistive device for balance Standing balance-Leahy Scale: Poor Standing balance comment: static standing with RW support and external support                            Communication Communication Communication: Impaired Factors Affecting Communication: Reduced clarity of speech  Cognition Arousal: Alert Behavior During Therapy: Flat affect   PT - Cognitive impairments: No apparent impairments  PT - Cognition Comments: Slow responses and processing Following commands: Intact      Cueing Cueing Techniques: Verbal cues  Exercises Other Exercises Other Exercises: Access Code: [email or web address]  URL: [email or web address]  Date: 04/18/2024  Prepared by: Stephane   Handout given to pt.  Pt and wife educated in the following Exercises with performing supine and sitting exercises  - Supine Ankle Pumps  - 2 x daily - 7 x weekly - 10 reps  - Supine Heel Slide  -  2 x daily - 7 x weekly - 3 sets - 10 reps  - Supine Hip Abduction  - 2 x daily - 7 x weekly - 3 sets - 10 reps  - Supine Quad Set  - 2 x daily - 7 x weekly - 3 sets - 10 reps - 5 hold  - Seated Long Arc Quad  - 2 x daily - 7 x weekly - 3 sets - 10 reps.  Pt educated to progress to standing exercises and PT demonstrated these - Standing Hip Flexion March  - 2 x daily - 7 x weekly - 3 sets - 10 reps  - Standing Hip Abduction  - 2 x daily - 7 x weekly - 3 sets - 10 reps  - Standing Hip Extension  - 2 x daily - 7 x weekly - 3 sets - 10 reps  - Standing Knee Flexion AROM with Chair Support  - 2 x daily - 7 x weekly - 3 sets - 10 reps    General Comments General comments (skin integrity, edema, etc.): Pt did not report dizziness.  REached out to nurse regarding abdominal binder as it was never delivered. Wife states they have TED hose at home.      Pertinent Vitals/Pain Pain Assessment Pain Assessment: No/denies pain    Home Living                          Prior Function            PT Goals (current goals can now be found in the care plan section) Progress towards PT goals: Progressing toward goals    Frequency    Min 2X/week      PT Plan      Co-evaluation              AM-PAC PT 6 Clicks Mobility   Outcome Measure  Help needed turning from your back to your side while in a flat bed without using bedrails?: A Little Help needed moving from lying on your back to sitting on the side of a flat bed without using bedrails?: A Little Help needed moving to and from a bed to a chair (including a wheelchair)?: A Little Help needed standing up from a chair using your arms (e.g., wheelchair or bedside chair)?: A Lot Help needed to walk in hospital room?: Total Help needed climbing 3-5 steps with a railing? : Total 6 Click Score: 13    End of Session Equipment Utilized During Treatment: Gait belt Activity Tolerance: Patient limited by fatigue;Patient tolerated treatment  well Patient left: in bed;with call bell/phone within reach;with bed alarm set;with family/visitor present Nurse Communication: Mobility status PT Visit Diagnosis: Difficulty in walking, not elsewhere classified (R26.2);Dizziness and giddiness (R42)     Time: 1030-1110 PT Time Calculation (min) (ACUTE ONLY): 40 min  Charges:    $Therapeutic Exercise: 23-37 mins $Therapeutic Activity: 8-22 mins PT General Charges $$ ACUTE PT VISIT: 1  Visit                     [facility] M,PT Acute Rehab Services [phone]    [name] 04/18/2024, 2:14 PM

## 2024-04-18 NOTE — Progress Notes (Addendum)
" °   04/18/24 0810  Peritoneal Catheter Mid lower abdomen  No placement date or time found.   Catheter Location: Mid lower abdomen  Site Assessment Clean, Dry, Intact  Drainage Description None  Catheter status Deaccessed  Dressing Gauze/Drain sponge  Dressing Status Clean, Dry, Intact  Dressing Intervention Assessed, no intervention needed  Completion  Treatment Status Complete  Initial Drain Volume 382  Average Dwell Time-Hour(s) 1  Average Dwell Time-Min(s) 30  Average Drain Time 25  Total Therapy Volume 87200  Total Therapy Time-Hour(s) 10  Total Therapy Time-Min(s) 49  Weight after Drain 211 lb 10.3 oz (96 kg)  Effluent Appearance Amber;Yellow  Cell Count on Daytime Exchange N/A  Fluid Balance - CCPD  Total UF (+ value on cycler, pt loss) 2571 mL  Procedure Comments  Tolerated treatment well? Yes  Peritoneal Dialysis Comments Pt. voice no complaints.  Education / Care Plan  Dialysis Education Provided Yes  Hand-off documentation  Hand-off Given Given to shift RN/LPN  Report given to (Full Name) Yolande Daring RN  Hand-off Received Received from shift RN/LPN  Report received from (Full Name) Zebedee Mace RN   PD post treatment note  PD treatment completed. Patient tolerated treatment well. PD effluent is clear. No specimen collected.  PD exit site clean, dry and intact. Patient is awake, oriented and in no acute distress.  Report given to bedside nurse.   Post treatment VS: 98.0, 85, 110/70, (84), 16, Spo2 99 on R/A  Total UF removed:  2571  Post treatment weight: 96 kg "

## 2024-04-18 NOTE — Telephone Encounter (Signed)
 Pharmacy Patient Advocate Encounter  Insurance verification completed.    The patient is insured through [facility]. Patient has Medicare and is not eligible for a copay card, but may be able to apply for patient assistance or Medicare RX Payment Plan (Patient Must reach out to their plan, if eligible for payment plan), if available.    Ran test claim for calcium  acetate 667 MG capsule and the current 30 day co-pay is $5.10.   This test claim was processed through [facility]- copay amounts may vary at other pharmacies due to pharmacy/plan contracts, or as the patient moves through the different stages of their insurance plan.

## 2024-04-18 NOTE — Progress Notes (Signed)
" °  [facility] Progress Note    Assessment/ Plan:   UTI with urinary obstruction/hydronephrosis: Prior indwelling foley, accidentally pulled out last week - now has been replaced. Initially on abx - now stopped. Urine Cx 12/31 negative. Per primary service Hyponatremia: 130.Continue to monitor/manage with PD. Nausea: On antiemetics, changed binder to Auryxia  Head CT with ?meningioma ESRD:  On PD at home, c/w CCPD. All 2.5% bags tonight Hypokalemia: Recurrent, continue  Kcl 20mEq daily. Hypotension/volume: BP low on admission, not on antihypertensives at home. Started on midodrine  10mg  TID. Anemia: Hgb 9.5 -> 7.7. Aranesp  200mcg given 1/2.  Hgb improved to 7.9.  Metabolic bone disease: CorrCa ok, follow. Phos better, have changed binder to Auryxia . Refuses renal diet.  Nutrition:  Alb low, continue supplements.  GOC: S/p palliative care visit, wants to continue full scope/full code  OP Dialysis Orders: CCPD - [facility], Dr. Tobie is his primary nephrologist 5 exchanges, 2.5L fill volume, EDW 90kg - Mircera 225mcg sq monthly, last 12/17   Discussed with wife at the bedside  Subjective:   Patient seen in room. No complaints, tolerated PD overnight, net uf charted 2571cc. Possible DC soon as per patient   Objective:   BP (!) 117/55 (BP Location: Right Arm)   Pulse 82   Temp 98.2 F (36.8 C)   Resp 19   Ht 6' 3 (1.905 m)   Wt 96 kg   SpO2 100%   BMI 26.45 kg/m   Intake/Output Summary (Last 24 hours) at 04/18/2024 1106 Last data filed at 04/18/2024 0900 Gross per 24 hour  Intake 400 ml  Output 3321 ml  Net -2921 ml   Weight change: 2 kg  Physical Exam: Gen: NAD CVS: RRR Resp: CTA BL Abd: soft, nt/nd Ext: no sig edema b/l Les Neuro: awake, alert Dialysis access: PD catheter  Imaging: No results found.  Labs: BMET Recent Labs  Lab 04/12/24 0404 04/13/24 0331 04/14/24 0502 04/15/24 0909 04/17/24 0321  NA 129* 130* 132* 131* 130*  K 4.2 3.9 4.0 4.2  4.3  CL 93* 95* 96* 95* 95*  CO2 25 25 25 25 23   GLUCOSE 171* 145* 182* 196* 343*  BUN 49* 51* 51* 53* 51*  CREATININE 10.00* 9.60* 9.20* 9.38* 9.54*  CALCIUM  7.6* 7.7* 7.9* 7.9* 8.0*  PHOS 3.1 2.9 2.3* 2.4* 2.3*   CBC Recent Labs  Lab 04/15/24 0909  WBC 6.8  NEUTROABS 4.7  HGB 7.9*  HCT 24.4*  MCV 103.4*  PLT 214    Medications:     (feeding supplement) PROSource Plus  30 mL Oral BID BM   DULoxetine   60 mg Oral Daily   ferric citrate   210 mg Oral TID WC   gabapentin   300 mg Oral QHS   gentamicin  cream  1 Application Topical Daily   heparin   5,000 Units Subcutaneous Q8H   metoCLOPramide   5 mg Oral BID AC   midodrine   10 mg Oral TID WC   multivitamin  1 tablet Oral QHS   polyethylene glycol  17 g Oral Daily   potassium chloride   20 mEq Oral Daily   sertraline   100 mg Oral Daily      Ephriam Stank, MD  [facility] 04/18/2024, 11:06 AM   "

## 2024-04-18 NOTE — TOC Transition Note (Signed)
 Transition of Care [facility]) - Discharge Note   Patient Details  Name: Louis Butler MRN: [id number] Date of Birth: 1956-02-07  [facility] CM/SW Contact:  Tom-Johnson, Pennye Beeghly Daphne, RN Phone Number: 04/18/2024, 12:51 PM   Clinical Narrative:     Patient is scheduled for discharge today.  Readmission Risk Assessment done. Outpatient f/u, hospital f/u and discharge instructions on AVS. Prescriptions sent to [facility] pharmacy and patient will receive meds prior discharge. Patient declined SNF and Home health recommendations. Engineer, Civil (consulting) ordered from [employer] and [facility] to deliver to patient's home.   Wife, Shona at bedside, daughter in-law Verneita will transport at discharge.  No further ICM needs noted.       Final next level of care: Home/Self Care (Declined Home health) Barriers to Discharge: Barriers Resolved   Patient Goals and CMS Choice Patient states their goals for this hospitalization and ongoing recovery are:: To return home CMS Medicare.gov Compare Post Acute Care list provided to:: Patient Choice offered to / list presented to : Spouse      Discharge Placement                Patient to be transferred to facility by: Daughter in-law Verneita Name of family member notified: Wife, Shona at bedside.    Discharge Plan and Services Additional resources added to the After Visit Summary for                  DME Arranged: Air overlay mattress DME Agency: [employer] Date DME Agency Contacted: 04/18/24 Time DME Agency Contacted: 1121 Representative spoke with at DME Agency: [facility] HH Arranged: Refused HH, Refused SNF HH Agency: NA        Social Drivers of Health (SDOH) Interventions SDOH Screenings   Food Insecurity: No Food Insecurity (04/06/2024)  Housing: Low Risk (04/06/2024)  Transportation Needs: No Transportation Needs (04/06/2024)  Utilities: Not At Risk (04/06/2024)  Alcohol Screen: Low Risk (12/01/2022)  Depression  (PHQ2-9): Medium Risk (12/29/2023)  Financial Resource Strain: Low Risk (11/20/2023)   Received from [facility]  Physical Activity: Insufficiently Active (12/01/2022)  Social Connections: Unknown (04/06/2024)  Stress: No Stress Concern Present (07/10/2023)   Received from [facility]  Tobacco Use: Medium Risk (04/05/2024)  Health Literacy: Adequate Health Literacy (12/01/2022)     Readmission Risk Interventions    04/08/2024    1:16 PM 05/04/2023    4:35 PM  Readmission Risk Prevention Plan  Transportation Screening Complete Complete  PCP or Specialist Appt within 3-5 Days Complete   HRI or Home Care Consult Complete Complete  Social Work Consult for Recovery Care Planning/Counseling Complete Complete  Palliative Care Screening Not Applicable Not Applicable  Medication Review Oceanographer) Referral to Pharmacy Referral to Pharmacy

## 2024-04-18 NOTE — Plan of Care (Signed)

## 2024-04-18 NOTE — Progress Notes (Signed)
" °  °  Durable Medical Equipment  (From admission, onward)           Start     Ordered   04/18/24 1217  For home use only DME Air overlay mattress  Once       Comments: A gel overlay mattress is required due to limited mobility, impaired nutrition status.   04/18/24 1220            "

## 2024-04-19 ENCOUNTER — Telehealth: Payer: Self-pay

## 2024-04-19 NOTE — Patient Instructions (Signed)
 Visit Information  Thank you for taking time to visit with me today. Please don't hesitate to contact me if I can be of assistance to you before our next scheduled telephone appointment.  Our next appointment is by telephone on 04/28/2024 at 2 pm  Following is a copy of your care plan:   Goals Addressed             This Visit's Progress    VBCI Transitions of Care (TOC) Care Plan       Problems:  Recent Hospitalization for treatment of ESRD and on peritoneal dialysis Knowledge Deficit Related to Chronic foley care, Out patient palliative services, ESRD on PD.   Goal:  Over the next 30 days, the patient will not experience hospital readmission  Interventions:  Transitions of Care: Durable Medical Equipment (DME) delivery confirmed Doctor Visits  - discussed the importance of doctor visits  Patient Self Care Activities:  Attend all scheduled provider appointments Call pharmacy for medication refills 3-7 days in advance of running out of medications Call provider office for new concerns or questions  Participate in Transition of Care Program/Attend TOC scheduled calls Take medications as prescribed   Work with outpatient palliative for assistance with symptom management.   Plan:  An initial telephone outreach has been scheduled for: 04/27/2024 around 2 pm.         Patient verbalizes understanding of instructions and care plan provided today and agrees to view in MyChart. Active MyChart status and patient understanding of how to access instructions and care plan via MyChart confirmed with patient.     Telephone follow up appointment with care management team member scheduled for:Our next appointment is by telephone on 04/28/2024 at 2 pm for one time follow up call discussed.   Please call the care guide team at [phone] if you need to cancel or reschedule your appointment.   Please call the [place]  National Suicide Prevention Lifeline: 609-843-7175 or TTY: 201-359-2551 TTY  (530)095-0773) to talk to a trained counselor call 1-800-273-TALK (toll free, 24 hour hotline) if you are experiencing a Mental Health or Behavioral Health Crisis or need someone to talk to.   Bing Edison MSN, RN RN [employer]  VBCI-Population Health Office Hours M-F [phone] Direct Dial : [phone] Main Phone [phone]  Fax: [phone] [email or web address]

## 2024-04-19 NOTE — Transitions of Care (Post Inpatient/ED Visit) (Signed)
 Today's TOC FU Call Status: Today's TOC FU Call Status:: Successful TOC FU Call Completed TOC FU Call Complete Date: 04/19/24  Patient's Name and Date of Birth confirmed. Name, DOB (Spouse as informant for all information gathered on this call.)  Transition Care Management Follow-up Telephone Call Date of Discharge: 04/18/24 Discharge Facility: [facility]) Type of Discharge: Inpatient Admission Primary Inpatient Discharge Diagnosis:: ESRD on peritoneal dialysis [facility]) How have you been since you were released from the hospital?: Better Any questions or concerns?: No  Items Reviewed: Did you receive and understand the discharge instructions provided?: Yes Medications obtained,verified, and reconciled?: Yes (Medications Reviewed) Any new allergies since your discharge?: No Dietary orders reviewed?: Yes Type of Diet Ordered:: ProSource Plus feeding supplements started at discharge. Do you have support at home?: Yes People in Home [RPT]: spouse, child(ren), adult Name of Support/Comfort Primary Source: [place], [place], Emergency Contact  [phone] (Home Phone)  Medications Reviewed Today: Medications Reviewed Today     Reviewed by Carolee Heron NOVAK, RN (Case Manager) on 04/19/24 at 1119  Med List Status: <None>   Medication Order Taking? Sig Documenting Provider Last Dose Status Informant  blood glucose meter kit and supplies KIT [id number] Yes Dispense based on patient and insurance preference. Use up to four times daily as directed. (FOR ICD-9 250.00, 250.01). Hongalgi, Anand D, MD  Active Self, Pharmacy Records  calcium  acetate (PHOSLO ) 667 MG capsule [id number] Yes Take 1 capsule (667 mg total) by mouth 3 (three) times daily with meals. Romelle Booty, MD  Active   Cholecalciferol  (VITAMIN D3) 125 MCG (5000 UT) CAPS [id number] Yes Take 5,000 Units by mouth See admin instructions. Take 5,000 units 2 times weekly. [provider]  Active Self, Pharmacy Records            Med Note HAROLDINE, LUKE A   Wed Apr 06, 2024  1:37 PM)    DULoxetine  (CYMBALTA ) 60 MG capsule [id number] Yes Take 1 capsule (60 mg total) by mouth daily. Romelle Booty, MD  Active   esomeprazole  (NEXIUM ) 40 MG capsule [id number] Yes Take 1 capsule (40 mg total) by mouth daily as needed (for heartburn). Romelle Booty, MD  Active Self, Pharmacy Records           Med Note Pearson, MAINE A   Wed Apr 06, 2024  1:34 PM)    famotidine  (PEPCID ) 20 MG tablet [id number] Yes Take 1 tablet (20 mg total) by mouth daily. Donah Laymon PARAS, MD  Active Self, Pharmacy Records  gabapentin  (NEURONTIN ) 300 MG capsule [id number] Yes Take 1 capsule (300 mg total) by mouth at bedtime. Donah Laymon PARAS, MD  Active Self, Pharmacy Records  hydrOXYzine  (ATARAX ) 25 MG tablet [id number] Yes Take 25 mg by mouth daily as needed for itching. [provider]  Active Self, Pharmacy Records           Med Note HAROLDINE, LUKE A   Wed Apr 06, 2024  1:34 PM)    Insulin  Syringes, Disposable, U-100 0.5 ML MISC [id number] Yes Use as per instructions 3 times daily AC. Hongalgi, Anand D, MD  Active Self, Pharmacy Records  iron  sucrose (VENOFER ) [id number] Yes Inject 110 mLs (200 mg total) into the vein every Monday, Wednesday, and Friday with hemodialysis. Rosendo Norleen BROCKS, MD  Active Self, Pharmacy Records           Med Note [place], MAINE A   Wed Apr 06, 2024  1:36 PM) May receive at dialysis  metoCLOPramide  (REGLAN ) 5 MG tablet  [id number] Yes Take 1 tablet (5 mg total) by mouth 2 (two) times daily before a meal. Romelle Booty, MD  Active   midodrine  (PROAMATINE ) 10 MG tablet [id number] Yes Take 1 tablet (10 mg total) by mouth 3 (three) times daily with meals. Romelle Booty, MD  Active   multivitamin (RENA-VIT) TABS tablet [id number] Yes Take 1 tablet by mouth at bedtime. Romelle Booty, MD  Active Self, Pharmacy Records  Nutritional Supplements (,FEEDING SUPPLEMENT, PROSOURCE PLUS) liquid [id number] Yes Take 30 mLs by mouth 2  (two) times daily between meals. Romelle Booty, MD  Active   ondansetron  (ZOFRAN -ODT) 4 MG disintegrating tablet [id number] Yes Take 1 tablet (4 mg total) by mouth every 6 (six) hours as needed for nausea or vomiting. Romelle Booty, MD  Active   polyethylene glycol (MIRALAX  / GLYCOLAX ) 17 g packet [id number] Yes Take 17 g by mouth daily as needed for moderate constipation. Romelle Booty, MD  Active Self, Pharmacy Records  potassium chloride  SA (KLOR-CON  M) 20 MEQ tablet [id number] Yes Take 1 tablet (20 mEq total) by mouth daily. Romelle Booty, MD  Active   sertraline  (ZOLOFT ) 100 MG tablet [id number] Yes Take 1 tablet by mouth every day Donah Laymon PARAS, MD  Active Self, Pharmacy Records  VELTASSA  8.4 g packet [id number]  Take 8.4 g by mouth daily.  Patient not taking: Reported on 04/19/2024   [provider]  Active Self, Pharmacy Records           Med Note HAROLDINE, LUKE A   Wed Apr 06, 2024  1:22 PM) Ptn stopped 02-20-24  Med List Note Haroldine Herlene DELENA Bishop 04/06/24 1208): [employer] mail in [phone]            Goals Addressed             This Visit's Progress    [facility] Transitions of Care (TOC) Care Plan       Problems:  Recent Hospitalization for treatment of ESRD and on peritoneal dialysis Knowledge Deficit Related to Chronic foley care, Out patient palliative services, ESRD on PD.   Goal:  Over the next 30 days, the patient will not experience hospital readmission  Interventions:  Transitions of Care: Durable Medical Equipment (DME) delivery confirmed Doctor Visits  - discussed the importance of doctor visits  Patient Self Care Activities:  Attend all scheduled provider appointments Call pharmacy for medication refills 3-7 days in advance of running out of medications Call provider office for new concerns or questions  Participate in Transition of Care Program/Attend TOC scheduled calls Take medications as prescribed   Work with outpatient palliative  for assistance with symptom management.   Plan:  An initial telephone outreach has been scheduled for: 04/27/2024 around 2 pm.         Home Care and Equipment/Supplies: Were Home Health Services Ordered?: No Any new equipment or medical supplies ordered?: Yes Name of Medical supply agency?: [employer] which was delivered to home per spouse. Were you able to get the equipment/medical supplies?: Yes Do you have any questions related to the use of the equipment/supplies?: No  Functional Questionnaire: Do you need assistance with bathing/showering or dressing?:  (Per spouse, she assists along with a daughter and DIL who are both CMA certified.) Do you need assistance with meal preparation?: Yes Do you need assistance with eating?: No Do you have difficulty maintaining continence: No (On peritoneal dialysis at home. Suprapubic chronic Foley in place.) Do you need assistance with getting out of bed/getting out  of a chair/moving?: Yes Do you have difficulty managing or taking your medications?: No (Per spouse, she assists along with a daughter and DIL who are both CMA certified.)  Follow up appointments reviewed: PCP Follow-up appointment confirmed?: Yes (Spouse will reach out to PCP via phone call or MyChart for HFU after review and rationale given.) MD Provider Line Number:[phone] Given: No Date of PCP follow-up appointment?: 04/21/24 Follow-up Provider: Alan Flies (normally sees Dr Donah). Specialist Hospital Follow-up appointment confirmed?: No (Outpatient palliative to follow and has been contact with patient/spouse and spouse has their contact information.) Reason Specialist Follow-Up Not Confirmed: Patient has Specialist Provider Number and will Call for Appointment Do you need transportation to your follow-up appointment?: No Do you understand care options if your condition(s) worsen?: Yes-patient verbalized understanding  SDOH Interventions Today     Flowsheet Row Most Recent Value  SDOH Interventions   Food Insecurity Interventions Intervention Not Indicated  Housing Interventions Intervention Not Indicated  Transportation Interventions Inpatient TOC, Intervention Not Indicated, Patient Resources (Friends/Family)  Utilities Interventions Intervention Not Indicated   Successful post discharge outreach with significant other, as informant for all information gathered, by phone today.  Agreed to a follow up call next week on 04/28/2024.  Patient understands discharge instructions and what to call provider for if condition worsens.  Patient to call to to schedule PCP HFU per preference after rationale provided/education on need. Scheduled for 04/21/2024.  Patient has specialist follow up appointments or specialist office will reach out to patient for scheduling.  The patient has been provided with contact information for the care management team and has been advised to call with any health-related questions or concerns.  The patient verbalized understanding with current POC and to contact PCP or specialist for any urgent questions or concerns.  The patient is directed to their insurance card regarding availability of benefits coverage  No changes to SDOH, patient reported they understood discharge instructions, medication review completed, medications obtained and understood what they are for and when to take, what to call provider for, no issues reported on assessment items, and no red flags noted on this call unless documented under assessment.    Patient pending neurosurgical referral for dizziness.    Bing Edison MSN, RN RN Case Manager [place]  [facility] Office Hours M-F [phone] Direct Dial : [phone] Main Phone [phone]  Fax: [phone] [email or web address]

## 2024-04-20 DIAGNOSIS — D329 Benign neoplasm of meninges, unspecified: Secondary | ICD-10-CM | POA: Insufficient documentation

## 2024-04-20 NOTE — Progress Notes (Deleted)
" ° ° ° °  SUBJECTIVE:   CHIEF COMPLAINT / HPI:   Louis Butler presents today for hospital follow up.   Hospitalized at [facility] from 04/05/24 to 04/18/24 for failure to thrive, orthostasis, and urinary retention. He was seen by PT, OT, RD, and palliative. Was started on midodrine  5 mg TID for symptoms. Foley was replaced (had fallen out 1 week prior to admission). Started on Reglan  BID for nausea. Did have CT head with concern for meningioma, confirmed via MRI. Declined from [facility] given peritoneal dialysis. Discharged home, where he lives with daughter-in-law who works in PT to do home PT.   PCP Follow-up Recommendations:*** Outpatient Palliative Care follow up.  Recommend outpatient Neurosurgery referral for meningioma.  Recommend follow up MRI Brain W WO contrast for staging and treatment planning.  Ensure follow up with Nephrology  Since discharge, patient reports ***  Healthcare Maintenance: - Diabetic foot exam - Diabetic eye exam - A1c due 05/25/24 - Colonoscopy - Lung cancer screening - Shingrix   PERTINENT  PMH / PSH: orthostatic hypotension, meningioma, T2DM, ESRD on PD, tobacco use, alcohol use, failure to thrive/malnutrition  OBJECTIVE:   There were no vitals taken for this visit.  ***  ASSESSMENT/PLAN:   Assessment & Plan Meningioma (HCC)      Alan Flies, MD [facility]  "

## 2024-04-21 ENCOUNTER — Ambulatory Visit: Payer: Self-pay

## 2024-04-28 ENCOUNTER — Telehealth: Payer: Self-pay

## 2024-05-02 ENCOUNTER — Ambulatory Visit: Admitting: Family Medicine

## 2024-05-06 ENCOUNTER — Other Ambulatory Visit: Payer: Self-pay

## 2024-05-06 NOTE — Transitions of Care (Post Inpatient/ED Visit) (Signed)
 05/06/2024  Patient ID: Louis Butler, male   DOB: 03-21-1956, 69 y.o.   MRN: [id number]   [facility] RN CM outreach call following unsuccessful outreach call on 04/28/24. Spouse answered call and stated everything was going okay, patient was actually doing a bit better, but did not feel follow up calls were needed at this time.    Bing Edison MSN, RN RN Case Sales [facility]  [facility]Population [facility] Office Hours M-F [phone] Direct Dial : [phone] Main Phone [phone]  Fax: [phone] [email or web address]

## 2024-05-09 ENCOUNTER — Encounter

## 2024-05-09 ENCOUNTER — Other Ambulatory Visit: Payer: Self-pay | Admitting: Family Medicine

## 2024-05-16 ENCOUNTER — Ambulatory Visit: Admitting: Family Medicine

## 2024-06-27 ENCOUNTER — Encounter
